# Patient Record
Sex: Female | Born: 1940 | Race: White | Hispanic: No | Marital: Single | Smoking: Former smoker
Health system: Southern US, Community
[De-identification: ages and names within clinical notes are randomized; demographics above are authoritative.]

## PROBLEM LIST (undated history)

## (undated) DIAGNOSIS — I3139 Other pericardial effusion (noninflammatory): Secondary | ICD-10-CM

## (undated) DIAGNOSIS — I10 Essential (primary) hypertension: Secondary | ICD-10-CM

## (undated) DIAGNOSIS — Z9889 Other specified postprocedural states: Secondary | ICD-10-CM

## (undated) DIAGNOSIS — IMO0001 Reserved for inherently not codable concepts without codable children: Secondary | ICD-10-CM

## (undated) DIAGNOSIS — T8859XA Other complications of anesthesia, initial encounter: Secondary | ICD-10-CM

## (undated) DIAGNOSIS — I2510 Atherosclerotic heart disease of native coronary artery without angina pectoris: Secondary | ICD-10-CM

## (undated) DIAGNOSIS — G8929 Other chronic pain: Secondary | ICD-10-CM

## (undated) DIAGNOSIS — T4145XA Adverse effect of unspecified anesthetic, initial encounter: Secondary | ICD-10-CM

## (undated) DIAGNOSIS — R112 Nausea with vomiting, unspecified: Secondary | ICD-10-CM

## (undated) DIAGNOSIS — I313 Pericardial effusion (noninflammatory): Secondary | ICD-10-CM

## (undated) DIAGNOSIS — N183 Chronic kidney disease, stage 3 unspecified: Secondary | ICD-10-CM

## (undated) DIAGNOSIS — C50919 Malignant neoplasm of unspecified site of unspecified female breast: Secondary | ICD-10-CM

## (undated) DIAGNOSIS — I4819 Other persistent atrial fibrillation: Secondary | ICD-10-CM

## (undated) DIAGNOSIS — IMO0002 Reserved for concepts with insufficient information to code with codable children: Secondary | ICD-10-CM

## (undated) DIAGNOSIS — I2584 Coronary atherosclerosis due to calcified coronary lesion: Secondary | ICD-10-CM

## (undated) DIAGNOSIS — J90 Pleural effusion, not elsewhere classified: Secondary | ICD-10-CM

## (undated) DIAGNOSIS — M549 Dorsalgia, unspecified: Secondary | ICD-10-CM

## (undated) HISTORY — DX: Malignant neoplasm of unspecified site of unspecified female breast: C50.919

## (undated) HISTORY — DX: Reserved for inherently not codable concepts without codable children: IMO0001

## (undated) HISTORY — DX: Pleural effusion, not elsewhere classified: J90

## (undated) HISTORY — DX: Reserved for concepts with insufficient information to code with codable children: IMO0002

---

## 1981-11-06 HISTORY — PX: PELVIC LAPAROSCOPY: SHX162

## 1981-12-13 HISTORY — PX: DILATION AND CURETTAGE, DIAGNOSTIC / THERAPEUTIC: SUR384

## 1982-02-21 HISTORY — PX: ABDOMINAL HYSTERECTOMY: SHX81

## 1997-07-13 DIAGNOSIS — C50919 Malignant neoplasm of unspecified site of unspecified female breast: Secondary | ICD-10-CM

## 1997-07-13 HISTORY — DX: Malignant neoplasm of unspecified site of unspecified female breast: C50.919

## 1997-07-13 HISTORY — PX: BREAST BIOPSY: SHX20

## 1997-07-29 HISTORY — PX: MASTECTOMY: SHX3

## 1997-08-11 ENCOUNTER — Encounter: Payer: Self-pay | Admitting: Internal Medicine

## 1998-08-29 ENCOUNTER — Other Ambulatory Visit: Admission: RE | Admit: 1998-08-29 | Discharge: 1998-08-29 | Payer: Self-pay | Admitting: Obstetrics and Gynecology

## 1999-07-10 ENCOUNTER — Ambulatory Visit (HOSPITAL_COMMUNITY): Admission: RE | Admit: 1999-07-10 | Discharge: 1999-07-10 | Payer: Self-pay | Admitting: Oncology

## 1999-07-10 ENCOUNTER — Encounter: Payer: Self-pay | Admitting: Oncology

## 1999-10-12 ENCOUNTER — Other Ambulatory Visit: Admission: RE | Admit: 1999-10-12 | Discharge: 1999-10-12 | Payer: Self-pay | Admitting: Obstetrics and Gynecology

## 1999-12-31 ENCOUNTER — Encounter: Admission: RE | Admit: 1999-12-31 | Discharge: 1999-12-31 | Payer: Self-pay | Admitting: Oncology

## 1999-12-31 ENCOUNTER — Encounter: Payer: Self-pay | Admitting: Oncology

## 2000-09-05 ENCOUNTER — Encounter: Admission: RE | Admit: 2000-09-05 | Discharge: 2000-09-05 | Payer: Self-pay | Admitting: Oncology

## 2000-09-05 ENCOUNTER — Encounter: Payer: Self-pay | Admitting: Oncology

## 2001-09-25 ENCOUNTER — Encounter: Admission: RE | Admit: 2001-09-25 | Discharge: 2001-09-25 | Payer: Self-pay | Admitting: Oncology

## 2001-09-25 ENCOUNTER — Encounter: Payer: Self-pay | Admitting: Oncology

## 2002-09-27 ENCOUNTER — Encounter: Payer: Self-pay | Admitting: Oncology

## 2002-09-27 ENCOUNTER — Encounter: Admission: RE | Admit: 2002-09-27 | Discharge: 2002-09-27 | Payer: Self-pay | Admitting: Oncology

## 2003-06-12 ENCOUNTER — Emergency Department (HOSPITAL_COMMUNITY): Admission: AD | Admit: 2003-06-12 | Discharge: 2003-06-12 | Payer: Self-pay | Admitting: Family Medicine

## 2003-07-18 ENCOUNTER — Encounter: Payer: Self-pay | Admitting: Internal Medicine

## 2003-09-28 ENCOUNTER — Encounter: Admission: RE | Admit: 2003-09-28 | Discharge: 2003-09-28 | Payer: Self-pay | Admitting: Oncology

## 2004-06-20 ENCOUNTER — Ambulatory Visit: Payer: Self-pay | Admitting: Internal Medicine

## 2004-06-27 ENCOUNTER — Ambulatory Visit: Payer: Self-pay | Admitting: Internal Medicine

## 2004-07-11 ENCOUNTER — Ambulatory Visit: Payer: Self-pay | Admitting: Internal Medicine

## 2004-08-08 ENCOUNTER — Ambulatory Visit: Payer: Self-pay | Admitting: Internal Medicine

## 2004-08-15 ENCOUNTER — Ambulatory Visit: Payer: Self-pay | Admitting: Internal Medicine

## 2004-08-28 ENCOUNTER — Ambulatory Visit: Payer: Self-pay | Admitting: Internal Medicine

## 2004-09-20 ENCOUNTER — Ambulatory Visit: Payer: Self-pay | Admitting: Internal Medicine

## 2004-10-01 ENCOUNTER — Encounter: Admission: RE | Admit: 2004-10-01 | Discharge: 2004-10-01 | Payer: Self-pay | Admitting: Oncology

## 2004-11-15 ENCOUNTER — Ambulatory Visit: Payer: Self-pay | Admitting: Internal Medicine

## 2005-03-14 ENCOUNTER — Ambulatory Visit: Payer: Self-pay | Admitting: Internal Medicine

## 2005-03-22 ENCOUNTER — Ambulatory Visit: Payer: Self-pay | Admitting: Internal Medicine

## 2005-04-08 ENCOUNTER — Ambulatory Visit: Payer: Self-pay | Admitting: Internal Medicine

## 2005-04-22 ENCOUNTER — Ambulatory Visit: Payer: Self-pay | Admitting: Gastroenterology

## 2005-04-22 ENCOUNTER — Encounter: Payer: Self-pay | Admitting: Internal Medicine

## 2005-04-22 ENCOUNTER — Ambulatory Visit: Payer: Self-pay | Admitting: Family Medicine

## 2005-05-14 ENCOUNTER — Ambulatory Visit: Payer: Self-pay | Admitting: Oncology

## 2005-05-20 ENCOUNTER — Encounter: Payer: Self-pay | Admitting: Internal Medicine

## 2005-05-20 ENCOUNTER — Ambulatory Visit: Payer: Self-pay | Admitting: Gastroenterology

## 2005-10-07 ENCOUNTER — Ambulatory Visit: Payer: Self-pay | Admitting: Internal Medicine

## 2006-03-27 ENCOUNTER — Ambulatory Visit: Payer: Self-pay | Admitting: Internal Medicine

## 2006-04-09 ENCOUNTER — Ambulatory Visit: Payer: Self-pay | Admitting: Internal Medicine

## 2006-05-12 ENCOUNTER — Ambulatory Visit: Payer: Self-pay | Admitting: Oncology

## 2006-10-20 ENCOUNTER — Ambulatory Visit: Payer: Self-pay | Admitting: Internal Medicine

## 2006-12-05 ENCOUNTER — Ambulatory Visit: Payer: Self-pay | Admitting: Internal Medicine

## 2006-12-05 LAB — CONVERTED CEMR LAB
Cholesterol: 185 mg/dL (ref 0–200)
HDL: 57.9 mg/dL (ref >39.0)
LDL Cholesterol: 94 mg/dL (ref 0–99)
Total CHOL/HDL Ratio: 3.2
Triglycerides: 168 mg/dL — ABNORMAL HIGH (ref 0–149)
VLDL: 34 mg/dL (ref 0–40)

## 2007-01-05 ENCOUNTER — Encounter: Admission: RE | Admit: 2007-01-05 | Discharge: 2007-01-05 | Payer: Self-pay | Admitting: Surgery

## 2007-06-04 ENCOUNTER — Ambulatory Visit: Payer: Self-pay | Admitting: Oncology

## 2007-06-09 LAB — COMPREHENSIVE METABOLIC PANEL
AST: 25 U/L (ref 0–37)
Albumin: 4.3 g/dL (ref 3.5–5.2)
BUN: 23 mg/dL (ref 6–23)
CO2: 26 mEq/L (ref 19–32)
Calcium: 9.6 mg/dL (ref 8.4–10.5)
Chloride: 103 mEq/L (ref 96–112)
Glucose, Bld: 91 mg/dL (ref 70–99)
Potassium: 3.9 mEq/L (ref 3.5–5.3)

## 2007-06-09 LAB — CBC WITH DIFFERENTIAL/PLATELET
Basophils Absolute: 0 10*3/uL (ref 0.0–0.1)
EOS%: 2.8 % (ref 0.0–7.0)
Eosinophils Absolute: 0.1 10*3/uL (ref 0.0–0.5)
HCT: 40.8 % (ref 34.8–46.6)
HGB: 14.5 g/dL (ref 11.6–15.9)
MONO#: 0.5 10*3/uL (ref 0.1–0.9)
NEUT#: 2.6 10*3/uL (ref 1.5–6.5)
RDW: 13.2 % (ref 11.3–14.5)
WBC: 5.2 10*3/uL (ref 3.9–10.0)
lymph#: 2 10*3/uL (ref 0.9–3.3)

## 2007-06-15 ENCOUNTER — Ambulatory Visit: Payer: Self-pay | Admitting: Internal Medicine

## 2007-06-15 LAB — CONVERTED CEMR LAB
ALT: 37 units/L — ABNORMAL HIGH (ref 0–35)
AST: 33 units/L (ref 0–37)
Albumin: 4.2 g/dL (ref 3.5–5.2)
Alkaline Phosphatase: 52 units/L (ref 39–117)
BUN: 18 mg/dL (ref 6–23)
Bilirubin, Direct: 0.2 mg/dL (ref 0.0–0.3)
CO2: 28 meq/L (ref 19–32)
Calcium: 10.1 mg/dL (ref 8.4–10.5)
Chloride: 102 meq/L (ref 96–112)
Cholesterol: 161 mg/dL (ref 0–200)
Creatinine, Ser: 0.9 mg/dL (ref 0.4–1.2)
GFR calc Af Amer: 81 mL/min
GFR calc non Af Amer: 67 mL/min
Glucose, Bld: 96 mg/dL (ref 70–99)
HDL: 43.7 mg/dL (ref >39.0)
LDL Cholesterol: 74 mg/dL (ref 0–99)
Potassium: 4.3 meq/L (ref 3.5–5.1)
Sodium: 141 meq/L (ref 135–145)
TSH: 1.82 microintl units/mL (ref 0.35–5.50)
Total Bilirubin: 1.2 mg/dL (ref 0.3–1.2)
Total CHOL/HDL Ratio: 3.7
Total Protein: 7.1 g/dL (ref 6.0–8.3)
Triglycerides: 218 mg/dL (ref 0–149)
VLDL: 44 mg/dL — ABNORMAL HIGH (ref 0–40)

## 2007-06-16 ENCOUNTER — Encounter: Payer: Self-pay | Admitting: Internal Medicine

## 2007-07-09 ENCOUNTER — Ambulatory Visit: Payer: Self-pay | Admitting: Internal Medicine

## 2007-07-24 ENCOUNTER — Telehealth: Payer: Self-pay | Admitting: Internal Medicine

## 2007-07-24 DIAGNOSIS — I10 Essential (primary) hypertension: Secondary | ICD-10-CM

## 2007-07-24 DIAGNOSIS — Z853 Personal history of malignant neoplasm of breast: Secondary | ICD-10-CM

## 2007-07-24 DIAGNOSIS — J309 Allergic rhinitis, unspecified: Secondary | ICD-10-CM

## 2007-07-24 DIAGNOSIS — E785 Hyperlipidemia, unspecified: Secondary | ICD-10-CM

## 2007-08-28 ENCOUNTER — Ambulatory Visit: Payer: Self-pay | Admitting: Internal Medicine

## 2007-09-03 ENCOUNTER — Ambulatory Visit: Payer: Self-pay | Admitting: Internal Medicine

## 2007-09-09 ENCOUNTER — Ambulatory Visit: Payer: Self-pay | Admitting: Internal Medicine

## 2007-09-10 ENCOUNTER — Encounter: Payer: Self-pay | Admitting: Internal Medicine

## 2007-10-21 ENCOUNTER — Encounter: Admission: RE | Admit: 2007-10-21 | Discharge: 2007-10-21 | Payer: Self-pay | Admitting: Oncology

## 2008-01-04 ENCOUNTER — Ambulatory Visit: Payer: Self-pay | Admitting: Internal Medicine

## 2008-01-04 DIAGNOSIS — T887XXA Unspecified adverse effect of drug or medicament, initial encounter: Secondary | ICD-10-CM

## 2008-01-04 LAB — CONVERTED CEMR LAB
ALT: 28 units/L (ref 0–35)
AST: 30 units/L (ref 0–37)
Albumin: 4.4 g/dL (ref 3.5–5.2)
BUN: 21 mg/dL (ref 6–23)
CO2: 27 meq/L (ref 19–32)
Chloride: 106 meq/L (ref 96–112)
Cholesterol: 170 mg/dL (ref 0–200)
Creatinine, Ser: 0.9 mg/dL (ref 0.4–1.2)
Direct LDL: 90 mg/dL
GFR calc non Af Amer: 67 mL/min
VLDL: 42 mg/dL — ABNORMAL HIGH (ref 0–40)

## 2008-01-15 ENCOUNTER — Telehealth: Payer: Self-pay | Admitting: Internal Medicine

## 2008-02-03 ENCOUNTER — Ambulatory Visit: Payer: Self-pay | Admitting: Internal Medicine

## 2008-02-03 DIAGNOSIS — M949 Disorder of cartilage, unspecified: Secondary | ICD-10-CM

## 2008-02-03 DIAGNOSIS — M899 Disorder of bone, unspecified: Secondary | ICD-10-CM | POA: Insufficient documentation

## 2008-03-08 ENCOUNTER — Encounter: Payer: Self-pay | Admitting: Internal Medicine

## 2008-03-08 ENCOUNTER — Telehealth: Payer: Self-pay | Admitting: Internal Medicine

## 2008-03-08 ENCOUNTER — Ambulatory Visit: Payer: Self-pay | Admitting: Internal Medicine

## 2008-03-08 LAB — CONVERTED CEMR LAB
Nitrite: NEGATIVE
Specific Gravity, Urine: 1.015

## 2008-06-06 ENCOUNTER — Ambulatory Visit: Payer: Self-pay | Admitting: Oncology

## 2008-06-08 ENCOUNTER — Encounter: Payer: Self-pay | Admitting: Internal Medicine

## 2008-06-08 LAB — CBC WITH DIFFERENTIAL/PLATELET
Basophils Absolute: 0 10*3/uL (ref 0.0–0.1)
Eosinophils Absolute: 0.3 10*3/uL (ref 0.0–0.5)
HGB: 14.7 g/dL (ref 11.6–15.9)
MONO#: 0.5 10*3/uL (ref 0.1–0.9)
NEUT#: 2.5 10*3/uL (ref 1.5–6.5)
RDW: 12.9 % (ref 11.3–14.5)
WBC: 5.2 10*3/uL (ref 3.9–10.0)
lymph#: 1.9 10*3/uL (ref 0.9–3.3)

## 2008-06-08 LAB — COMPREHENSIVE METABOLIC PANEL
Albumin: 4.3 g/dL (ref 3.5–5.2)
BUN: 19 mg/dL (ref 6–23)
Calcium: 10.5 mg/dL (ref 8.4–10.5)
Chloride: 105 mEq/L (ref 96–112)
Glucose, Bld: 82 mg/dL (ref 70–99)
Potassium: 3.8 mEq/L (ref 3.5–5.3)

## 2008-06-15 ENCOUNTER — Encounter: Payer: Self-pay | Admitting: Internal Medicine

## 2008-06-27 ENCOUNTER — Ambulatory Visit: Payer: Self-pay | Admitting: Internal Medicine

## 2008-06-27 DIAGNOSIS — J9801 Acute bronchospasm: Secondary | ICD-10-CM | POA: Insufficient documentation

## 2008-06-29 ENCOUNTER — Ambulatory Visit: Payer: Self-pay | Admitting: Internal Medicine

## 2008-06-29 LAB — CONVERTED CEMR LAB
ALT: 30 units/L (ref 0–35)
Alkaline Phosphatase: 52 units/L (ref 39–117)
Bilirubin, Direct: 0.1 mg/dL (ref 0.0–0.3)
CO2: 28 meq/L (ref 19–32)
GFR calc Af Amer: 72 mL/min
Glucose, Bld: 97 mg/dL (ref 70–99)
Potassium: 4 meq/L (ref 3.5–5.1)
Sodium: 142 meq/L (ref 135–145)
Total Protein: 7.2 g/dL (ref 6.0–8.3)

## 2008-07-29 ENCOUNTER — Ambulatory Visit: Payer: Self-pay | Admitting: Internal Medicine

## 2008-08-15 ENCOUNTER — Ambulatory Visit: Payer: Self-pay | Admitting: Internal Medicine

## 2008-09-12 ENCOUNTER — Ambulatory Visit: Payer: Self-pay | Admitting: Internal Medicine

## 2008-10-10 ENCOUNTER — Ambulatory Visit: Payer: Self-pay | Admitting: Internal Medicine

## 2008-10-12 ENCOUNTER — Encounter: Payer: Self-pay | Admitting: Internal Medicine

## 2008-10-12 ENCOUNTER — Encounter: Admission: RE | Admit: 2008-10-12 | Discharge: 2008-10-12 | Payer: Self-pay | Admitting: Allergy

## 2008-10-13 ENCOUNTER — Ambulatory Visit: Payer: Self-pay | Admitting: Internal Medicine

## 2008-10-13 DIAGNOSIS — J90 Pleural effusion, not elsewhere classified: Secondary | ICD-10-CM | POA: Insufficient documentation

## 2008-10-14 LAB — CONVERTED CEMR LAB: BUN: 28 mg/dL — ABNORMAL HIGH (ref 6–23)

## 2008-10-17 ENCOUNTER — Ambulatory Visit: Payer: Self-pay | Admitting: Cardiology

## 2008-10-17 ENCOUNTER — Ambulatory Visit: Payer: Self-pay | Admitting: Pulmonary Disease

## 2008-10-17 ENCOUNTER — Inpatient Hospital Stay (HOSPITAL_COMMUNITY): Admission: EM | Admit: 2008-10-17 | Discharge: 2008-10-18 | Payer: Self-pay | Admitting: Emergency Medicine

## 2008-10-18 ENCOUNTER — Encounter: Payer: Self-pay | Admitting: Pulmonary Disease

## 2008-10-18 HISTORY — PX: THORACENTESIS: SHX235

## 2008-10-25 ENCOUNTER — Ambulatory Visit: Payer: Self-pay | Admitting: Pulmonary Disease

## 2008-10-31 ENCOUNTER — Ambulatory Visit (HOSPITAL_COMMUNITY): Admission: RE | Admit: 2008-10-31 | Discharge: 2008-10-31 | Payer: Self-pay | Admitting: Oncology

## 2008-10-31 ENCOUNTER — Ambulatory Visit: Payer: Self-pay | Admitting: Oncology

## 2008-11-02 ENCOUNTER — Encounter: Payer: Self-pay | Admitting: Internal Medicine

## 2008-11-02 ENCOUNTER — Ambulatory Visit: Payer: Self-pay | Admitting: Thoracic Surgery

## 2008-11-02 ENCOUNTER — Emergency Department (HOSPITAL_COMMUNITY): Admission: EM | Admit: 2008-11-02 | Discharge: 2008-11-02 | Payer: Self-pay | Admitting: Emergency Medicine

## 2008-11-02 LAB — COMPREHENSIVE METABOLIC PANEL
ALT: 19 U/L (ref 0–35)
AST: 21 U/L (ref 0–37)
Albumin: 4.1 g/dL (ref 3.5–5.2)
CO2: 23 mEq/L (ref 19–32)
Calcium: 13.4 mg/dL (ref 8.4–10.5)
Chloride: 104 mEq/L (ref 96–112)
Potassium: 3.6 mEq/L (ref 3.5–5.3)

## 2008-11-02 LAB — CBC WITH DIFFERENTIAL/PLATELET
BASO%: 0.8 % (ref 0.0–2.0)
Basophils Absolute: 0.1 10*3/uL (ref 0.0–0.1)
EOS%: 3 % (ref 0.0–7.0)
HCT: 38.6 % (ref 34.8–46.6)
HGB: 13.4 g/dL (ref 11.6–15.9)
MCH: 29.4 pg (ref 25.1–34.0)
MCHC: 34.7 g/dL (ref 31.5–36.0)
MONO#: 0.7 10*3/uL (ref 0.1–0.9)
NEUT%: 60.9 % (ref 38.4–76.8)
RDW: 12.7 % (ref 11.2–14.5)
WBC: 7.2 10*3/uL (ref 3.9–10.3)
lymph#: 1.8 10*3/uL (ref 0.9–3.3)

## 2008-11-02 LAB — CEA: CEA: 36 ng/mL — ABNORMAL HIGH (ref 0.0–5.0)

## 2008-11-04 ENCOUNTER — Ambulatory Visit (HOSPITAL_COMMUNITY): Admission: RE | Admit: 2008-11-04 | Discharge: 2008-11-04 | Payer: Self-pay | Admitting: Pediatrics

## 2008-11-04 ENCOUNTER — Encounter: Payer: Self-pay | Admitting: Pulmonary Disease

## 2008-11-08 ENCOUNTER — Encounter: Payer: Self-pay | Admitting: Thoracic Surgery

## 2008-11-08 ENCOUNTER — Inpatient Hospital Stay (HOSPITAL_COMMUNITY): Admission: RE | Admit: 2008-11-08 | Discharge: 2008-11-13 | Payer: Self-pay | Admitting: Thoracic Surgery

## 2008-11-08 ENCOUNTER — Ambulatory Visit: Payer: Self-pay | Admitting: Thoracic Surgery

## 2008-11-08 DIAGNOSIS — J90 Pleural effusion, not elsewhere classified: Secondary | ICD-10-CM

## 2008-11-08 HISTORY — DX: Pleural effusion, not elsewhere classified: J90

## 2008-11-16 ENCOUNTER — Encounter: Admission: RE | Admit: 2008-11-16 | Discharge: 2008-11-16 | Payer: Self-pay | Admitting: Thoracic Surgery

## 2008-11-16 ENCOUNTER — Ambulatory Visit: Payer: Self-pay | Admitting: Thoracic Surgery

## 2008-11-18 ENCOUNTER — Encounter: Payer: Self-pay | Admitting: Internal Medicine

## 2008-11-18 LAB — COMPREHENSIVE METABOLIC PANEL
Albumin: 3.5 g/dL (ref 3.5–5.2)
Alkaline Phosphatase: 187 U/L — ABNORMAL HIGH (ref 39–117)
BUN: 25 mg/dL — ABNORMAL HIGH (ref 6–23)
Calcium: 11.8 mg/dL — ABNORMAL HIGH (ref 8.4–10.5)
Chloride: 105 mEq/L (ref 96–112)
Glucose, Bld: 123 mg/dL — ABNORMAL HIGH (ref 70–99)
Potassium: 4.7 mEq/L (ref 3.5–5.3)

## 2008-11-18 LAB — CBC WITH DIFFERENTIAL/PLATELET
Basophils Absolute: 0 10*3/uL (ref 0.0–0.1)
Eosinophils Absolute: 0.1 10*3/uL (ref 0.0–0.5)
HGB: 11.4 g/dL — ABNORMAL LOW (ref 11.6–15.9)
MCV: 84.4 fL (ref 79.5–101.0)
MONO%: 11.9 % (ref 0.0–14.0)
NEUT#: 4.6 10*3/uL (ref 1.5–6.5)
RDW: 13.2 % (ref 11.2–14.5)

## 2008-11-23 ENCOUNTER — Encounter: Admission: RE | Admit: 2008-11-23 | Discharge: 2008-11-23 | Payer: Self-pay | Admitting: Thoracic Surgery

## 2008-11-23 ENCOUNTER — Ambulatory Visit: Payer: Self-pay | Admitting: Thoracic Surgery

## 2008-12-06 ENCOUNTER — Encounter: Admission: RE | Admit: 2008-12-06 | Discharge: 2008-12-06 | Payer: Self-pay | Admitting: Thoracic Surgery

## 2008-12-06 ENCOUNTER — Ambulatory Visit: Payer: Self-pay | Admitting: Thoracic Surgery

## 2008-12-07 ENCOUNTER — Encounter: Payer: Self-pay | Admitting: Pulmonary Disease

## 2008-12-07 LAB — CBC WITH DIFFERENTIAL/PLATELET
BASO%: 0.4 % (ref 0.0–2.0)
Eosinophils Absolute: 0.1 10*3/uL (ref 0.0–0.5)
HCT: 35.1 % (ref 34.8–46.6)
HGB: 12.1 g/dL (ref 11.6–15.9)
MCHC: 34.4 g/dL (ref 31.5–36.0)
MONO#: 0.6 10*3/uL (ref 0.1–0.9)
NEUT#: 2.6 10*3/uL (ref 1.5–6.5)
NEUT%: 54.4 % (ref 38.4–76.8)
WBC: 4.9 10*3/uL (ref 3.9–10.3)
lymph#: 1.5 10*3/uL (ref 0.9–3.3)

## 2008-12-14 LAB — COMPREHENSIVE METABOLIC PANEL
AST: 16 U/L (ref 0–37)
Albumin: 4 g/dL (ref 3.5–5.2)
Alkaline Phosphatase: 93 U/L (ref 39–117)
BUN: 24 mg/dL — ABNORMAL HIGH (ref 6–23)
Creatinine, Ser: 1.01 mg/dL (ref 0.40–1.20)
Glucose, Bld: 103 mg/dL — ABNORMAL HIGH (ref 70–99)

## 2008-12-14 LAB — PTH, INTACT AND CALCIUM
Calcium, Total (PTH): 10 mg/dL (ref 8.4–10.5)
PTH: 11.1 pg/mL — ABNORMAL LOW (ref 14.0–72.0)

## 2009-01-05 ENCOUNTER — Ambulatory Visit: Payer: Self-pay | Admitting: Oncology

## 2009-01-09 LAB — CBC WITH DIFFERENTIAL/PLATELET
EOS%: 3.4 % (ref 0.0–7.0)
MCH: 29.7 pg (ref 25.1–34.0)
MCV: 84.1 fL (ref 79.5–101.0)
MONO%: 11 % (ref 0.0–14.0)
NEUT#: 1.4 10*3/uL — ABNORMAL LOW (ref 1.5–6.5)
RBC: 4.65 10*6/uL (ref 3.70–5.45)
RDW: 14.8 % — ABNORMAL HIGH (ref 11.2–14.5)
lymph#: 1.5 10*3/uL (ref 0.9–3.3)

## 2009-01-09 LAB — COMPREHENSIVE METABOLIC PANEL
ALT: 16 U/L (ref 0–35)
AST: 20 U/L (ref 0–37)
Albumin: 4.3 g/dL (ref 3.5–5.2)
Alkaline Phosphatase: 79 U/L (ref 39–117)
Chloride: 107 mEq/L (ref 96–112)
Potassium: 4.2 mEq/L (ref 3.5–5.3)
Sodium: 140 mEq/L (ref 135–145)
Total Protein: 7.3 g/dL (ref 6.0–8.3)

## 2009-01-09 LAB — LIPID PANEL: LDL Cholesterol: 72 mg/dL (ref 0–99)

## 2009-02-14 ENCOUNTER — Ambulatory Visit: Payer: Self-pay | Admitting: Oncology

## 2009-02-17 LAB — CBC WITH DIFFERENTIAL/PLATELET
Eosinophils Absolute: 0.1 10*3/uL (ref 0.0–0.5)
MONO#: 0.4 10*3/uL (ref 0.1–0.9)
NEUT#: 1.7 10*3/uL (ref 1.5–6.5)
RBC: 4.74 10*6/uL (ref 3.70–5.45)
RDW: 14.1 % (ref 11.2–14.5)
WBC: 4 10*3/uL (ref 3.9–10.3)
lymph#: 1.7 10*3/uL (ref 0.9–3.3)

## 2009-02-17 LAB — COMPREHENSIVE METABOLIC PANEL
Albumin: 4.3 g/dL (ref 3.5–5.2)
Alkaline Phosphatase: 83 U/L (ref 39–117)
CO2: 26 mEq/L (ref 19–32)
Glucose, Bld: 115 mg/dL — ABNORMAL HIGH (ref 70–99)
Potassium: 4 mEq/L (ref 3.5–5.3)
Sodium: 141 mEq/L (ref 135–145)
Total Protein: 7.1 g/dL (ref 6.0–8.3)

## 2009-02-17 LAB — CANCER ANTIGEN 27.29: CA 27.29: 401 U/mL — ABNORMAL HIGH (ref 0–39)

## 2009-02-17 LAB — CEA: CEA: 13.4 ng/mL — ABNORMAL HIGH (ref 0.0–5.0)

## 2009-03-08 ENCOUNTER — Encounter: Admission: RE | Admit: 2009-03-08 | Discharge: 2009-03-08 | Payer: Self-pay | Admitting: Oncology

## 2009-03-28 ENCOUNTER — Ambulatory Visit: Payer: Self-pay | Admitting: Thoracic Surgery

## 2009-04-11 ENCOUNTER — Ambulatory Visit: Payer: Self-pay | Admitting: Oncology

## 2009-04-12 ENCOUNTER — Ambulatory Visit: Payer: Self-pay | Admitting: Oncology

## 2009-04-13 LAB — CBC WITH DIFFERENTIAL/PLATELET
Basophils Absolute: 0 10*3/uL (ref 0.0–0.1)
HCT: 39.1 % (ref 34.8–46.6)
HGB: 13.7 g/dL (ref 11.6–15.9)
LYMPH%: 34.6 % (ref 14.0–49.7)
MCH: 29.8 pg (ref 25.1–34.0)
MONO#: 0.5 10*3/uL (ref 0.1–0.9)
NEUT%: 51.9 % (ref 38.4–76.8)
Platelets: 218 10*3/uL (ref 145–400)
WBC: 4.6 10*3/uL (ref 3.9–10.3)
lymph#: 1.6 10*3/uL (ref 0.9–3.3)

## 2009-04-13 LAB — COMPREHENSIVE METABOLIC PANEL
AST: 20 U/L (ref 0–37)
BUN: 24 mg/dL — ABNORMAL HIGH (ref 6–23)
CO2: 24 mEq/L (ref 19–32)
Calcium: 9.8 mg/dL (ref 8.4–10.5)
Chloride: 106 mEq/L (ref 96–112)
Creatinine, Ser: 0.99 mg/dL (ref 0.40–1.20)

## 2009-04-13 LAB — CANCER ANTIGEN 27.29: CA 27.29: 364 U/mL — ABNORMAL HIGH (ref 0–39)

## 2009-06-08 ENCOUNTER — Ambulatory Visit: Payer: Self-pay | Admitting: Oncology

## 2009-06-12 LAB — CBC WITH DIFFERENTIAL/PLATELET
BASO%: 0.6 % (ref 0.0–2.0)
Basophils Absolute: 0 10*3/uL (ref 0.0–0.1)
EOS%: 4 % (ref 0.0–7.0)
MCH: 30.9 pg (ref 25.1–34.0)
MCHC: 34.5 g/dL (ref 31.5–36.0)
MCV: 89.3 fL (ref 79.5–101.0)
MONO%: 10 % (ref 0.0–14.0)
RBC: 4.67 10*6/uL (ref 3.70–5.45)
RDW: 13.2 % (ref 11.2–14.5)
lymph#: 1.8 10*3/uL (ref 0.9–3.3)

## 2009-06-13 LAB — COMPREHENSIVE METABOLIC PANEL
ALT: 23 U/L (ref 0–35)
AST: 25 U/L (ref 0–37)
Albumin: 4.6 g/dL (ref 3.5–5.2)
Alkaline Phosphatase: 72 U/L (ref 39–117)
BUN: 23 mg/dL (ref 6–23)
Chloride: 103 mEq/L (ref 96–112)
Potassium: 4.3 mEq/L (ref 3.5–5.3)
Sodium: 139 mEq/L (ref 135–145)

## 2009-06-13 LAB — CEA: CEA: 10.3 ng/mL — ABNORMAL HIGH (ref 0.0–5.0)

## 2009-07-19 ENCOUNTER — Ambulatory Visit: Payer: Self-pay | Admitting: Oncology

## 2009-07-24 LAB — CBC WITH DIFFERENTIAL/PLATELET
Basophils Absolute: 0 10*3/uL (ref 0.0–0.1)
EOS%: 6.5 % (ref 0.0–7.0)
Eosinophils Absolute: 0.3 10*3/uL (ref 0.0–0.5)
HCT: 41.5 % (ref 34.8–46.6)
HGB: 14.6 g/dL (ref 11.6–15.9)
MCH: 31 pg (ref 25.1–34.0)
NEUT%: 40.1 % (ref 38.4–76.8)
lymph#: 1.7 10*3/uL (ref 0.9–3.3)

## 2009-07-24 LAB — COMPREHENSIVE METABOLIC PANEL
AST: 19 U/L (ref 0–37)
BUN: 23 mg/dL (ref 6–23)
CO2: 23 mEq/L (ref 19–32)
Calcium: 9.4 mg/dL (ref 8.4–10.5)
Chloride: 106 mEq/L (ref 96–112)
Creatinine, Ser: 0.86 mg/dL (ref 0.40–1.20)
Glucose, Bld: 93 mg/dL (ref 70–99)

## 2009-07-24 LAB — LIPID PANEL
Cholesterol: 170 mg/dL (ref 0–200)
Triglycerides: 228 mg/dL — ABNORMAL HIGH (ref <150)

## 2009-07-24 LAB — CANCER ANTIGEN 27.29: CA 27.29: 385 U/mL — ABNORMAL HIGH (ref 0–39)

## 2009-07-24 LAB — CEA: CEA: 11 ng/mL — ABNORMAL HIGH (ref 0.0–5.0)

## 2009-09-15 ENCOUNTER — Ambulatory Visit: Payer: Self-pay | Admitting: Oncology

## 2009-09-15 LAB — COMPREHENSIVE METABOLIC PANEL
ALT: 21 U/L (ref 0–35)
AST: 23 U/L (ref 0–37)
Chloride: 105 mEq/L (ref 96–112)
Creatinine, Ser: 0.84 mg/dL (ref 0.40–1.20)
Sodium: 142 mEq/L (ref 135–145)
Total Bilirubin: 0.6 mg/dL (ref 0.3–1.2)
Total Protein: 7.3 g/dL (ref 6.0–8.3)

## 2009-09-15 LAB — CBC WITH DIFFERENTIAL/PLATELET
BASO%: 0.5 % (ref 0.0–2.0)
EOS%: 4.5 % (ref 0.0–7.0)
HCT: 40.9 % (ref 34.8–46.6)
MCH: 30.6 pg (ref 25.1–34.0)
MCHC: 34.8 g/dL (ref 31.5–36.0)
MONO#: 0.5 10*3/uL (ref 0.1–0.9)
NEUT%: 49.2 % (ref 38.4–76.8)
RBC: 4.65 10*6/uL (ref 3.70–5.45)
WBC: 5.4 10*3/uL (ref 3.9–10.3)
lymph#: 2 10*3/uL (ref 0.9–3.3)

## 2009-11-06 ENCOUNTER — Ambulatory Visit: Payer: Self-pay | Admitting: Oncology

## 2009-11-08 LAB — CANCER ANTIGEN 27.29: CA 27.29: 335 U/mL — ABNORMAL HIGH (ref 0–39)

## 2009-11-08 LAB — COMPREHENSIVE METABOLIC PANEL
ALT: 17 U/L (ref 0–35)
Albumin: 4.3 g/dL (ref 3.5–5.2)
CO2: 24 mEq/L (ref 19–32)
Calcium: 9.9 mg/dL (ref 8.4–10.5)
Chloride: 103 mEq/L (ref 96–112)
Glucose, Bld: 107 mg/dL — ABNORMAL HIGH (ref 70–99)
Potassium: 4.3 mEq/L (ref 3.5–5.3)
Sodium: 138 mEq/L (ref 135–145)
Total Bilirubin: 0.7 mg/dL (ref 0.3–1.2)
Total Protein: 6.8 g/dL (ref 6.0–8.3)

## 2010-01-05 ENCOUNTER — Ambulatory Visit: Payer: Self-pay | Admitting: Oncology

## 2010-01-09 LAB — CBC WITH DIFFERENTIAL/PLATELET
Basophils Absolute: 0 10*3/uL (ref 0.0–0.1)
EOS%: 4.7 % (ref 0.0–7.0)
Eosinophils Absolute: 0.3 10*3/uL (ref 0.0–0.5)
HGB: 13.8 g/dL (ref 11.6–15.9)
LYMPH%: 32.5 % (ref 14.0–49.7)
MCH: 30.6 pg (ref 25.1–34.0)
MCV: 87.3 fL (ref 79.5–101.0)
MONO%: 9.8 % (ref 0.0–14.0)
NEUT#: 2.8 10*3/uL (ref 1.5–6.5)
Platelets: 216 10*3/uL (ref 145–400)
RDW: 13.3 % (ref 11.2–14.5)

## 2010-01-09 LAB — COMPREHENSIVE METABOLIC PANEL
AST: 29 U/L (ref 0–37)
Alkaline Phosphatase: 62 U/L (ref 39–117)
BUN: 19 mg/dL (ref 6–23)
Creatinine, Ser: 0.82 mg/dL (ref 0.40–1.20)
Glucose, Bld: 93 mg/dL (ref 70–99)
Potassium: 4.2 mEq/L (ref 3.5–5.3)
Total Bilirubin: 1 mg/dL (ref 0.3–1.2)

## 2010-01-09 LAB — CEA: CEA: 9 ng/mL — ABNORMAL HIGH (ref 0.0–5.0)

## 2010-01-10 ENCOUNTER — Ambulatory Visit (HOSPITAL_COMMUNITY): Admission: RE | Admit: 2010-01-10 | Discharge: 2010-01-10 | Payer: Self-pay | Admitting: Oncology

## 2010-01-11 ENCOUNTER — Ambulatory Visit
Admission: RE | Admit: 2010-01-11 | Discharge: 2010-01-11 | Payer: Self-pay | Source: Home / Self Care | Admitting: Oncology

## 2010-01-11 ENCOUNTER — Ambulatory Visit: Payer: Self-pay | Admitting: Surgery

## 2010-01-11 ENCOUNTER — Encounter: Payer: Self-pay | Admitting: Oncology

## 2010-03-07 ENCOUNTER — Ambulatory Visit: Payer: Self-pay | Admitting: Oncology

## 2010-03-09 ENCOUNTER — Encounter: Admission: RE | Admit: 2010-03-09 | Discharge: 2010-03-09 | Payer: Self-pay | Admitting: Oncology

## 2010-03-09 LAB — COMPREHENSIVE METABOLIC PANEL
ALT: 16 U/L (ref 0–35)
AST: 22 U/L (ref 0–37)
Albumin: 4.5 g/dL (ref 3.5–5.2)
Alkaline Phosphatase: 61 U/L (ref 39–117)
Calcium: 10 mg/dL (ref 8.4–10.5)
Chloride: 106 mEq/L (ref 96–112)
Creatinine, Ser: 0.87 mg/dL (ref 0.40–1.20)
Potassium: 4.4 mEq/L (ref 3.5–5.3)

## 2010-05-03 ENCOUNTER — Ambulatory Visit: Payer: Self-pay | Admitting: Oncology

## 2010-05-07 LAB — COMPREHENSIVE METABOLIC PANEL
Albumin: 3.9 g/dL (ref 3.5–5.2)
BUN: 24 mg/dL — ABNORMAL HIGH (ref 6–23)
CO2: 28 mEq/L (ref 19–32)
Calcium: 9.7 mg/dL (ref 8.4–10.5)
Chloride: 106 mEq/L (ref 96–112)
Creatinine, Ser: 0.98 mg/dL (ref 0.40–1.20)
Glucose, Bld: 132 mg/dL — ABNORMAL HIGH (ref 70–99)
Potassium: 3.8 mEq/L (ref 3.5–5.3)

## 2010-05-07 LAB — CANCER ANTIGEN 27.29: CA 27.29: 267 U/mL — ABNORMAL HIGH (ref 0–39)

## 2010-06-29 ENCOUNTER — Ambulatory Visit: Payer: Self-pay | Admitting: Oncology

## 2010-07-03 LAB — CBC WITH DIFFERENTIAL/PLATELET
Basophils Absolute: 0 10*3/uL (ref 0.0–0.1)
Eosinophils Absolute: 0.3 10*3/uL (ref 0.0–0.5)
HGB: 14.4 g/dL (ref 11.6–15.9)
LYMPH%: 37.7 % (ref 14.0–49.7)
MCH: 30.7 pg (ref 25.1–34.0)
MCV: 86.8 fL (ref 79.5–101.0)
MONO%: 11.6 % (ref 0.0–14.0)
NEUT#: 1.9 10*3/uL (ref 1.5–6.5)
NEUT%: 42.4 % (ref 38.4–76.8)
Platelets: 233 10*3/uL (ref 145–400)

## 2010-07-03 LAB — COMPREHENSIVE METABOLIC PANEL
Albumin: 4.5 g/dL (ref 3.5–5.2)
Alkaline Phosphatase: 69 U/L (ref 39–117)
BUN: 22 mg/dL (ref 6–23)
Creatinine, Ser: 0.9 mg/dL (ref 0.40–1.20)
Glucose, Bld: 91 mg/dL (ref 70–99)
Total Bilirubin: 0.9 mg/dL (ref 0.3–1.2)

## 2010-07-30 ENCOUNTER — Ambulatory Visit: Payer: Self-pay | Admitting: Oncology

## 2010-08-02 LAB — LIPID PANEL
Cholesterol: 174 mg/dL (ref 0–200)
HDL: 50 mg/dL (ref >39)
LDL Cholesterol: 72 mg/dL (ref 0–99)
Total CHOL/HDL Ratio: 3.5 ratio
Triglycerides: 259 mg/dL — ABNORMAL HIGH (ref <150)
VLDL: 52 mg/dL — ABNORMAL HIGH (ref 0–40)

## 2010-08-02 LAB — CANCER ANTIGEN 27.29: CA 27.29: 276 U/mL — ABNORMAL HIGH (ref 0–39)

## 2010-08-12 ENCOUNTER — Encounter: Payer: Self-pay | Admitting: Oncology

## 2010-08-21 NOTE — Letter (Signed)
Summary: [facility]  [facility]   Imported By: Maryln Gottron 07/13/2008 14:29:38  _____________________________________________________________________  External Attachment:    Type:   Image     Comment:   External Document

## 2010-09-21 ENCOUNTER — Encounter (HOSPITAL_BASED_OUTPATIENT_CLINIC_OR_DEPARTMENT_OTHER): Payer: Medicare Other | Admitting: Oncology

## 2010-09-21 ENCOUNTER — Other Ambulatory Visit: Payer: Self-pay | Admitting: Oncology

## 2010-09-21 DIAGNOSIS — M949 Disorder of cartilage, unspecified: Secondary | ICD-10-CM

## 2010-09-21 DIAGNOSIS — C782 Secondary malignant neoplasm of pleura: Secondary | ICD-10-CM

## 2010-09-21 DIAGNOSIS — C50919 Malignant neoplasm of unspecified site of unspecified female breast: Secondary | ICD-10-CM

## 2010-09-21 DIAGNOSIS — Z87891 Personal history of nicotine dependence: Secondary | ICD-10-CM

## 2010-09-21 LAB — CBC WITH DIFFERENTIAL/PLATELET
Eosinophils Absolute: 0.2 10*3/uL (ref 0.0–0.5)
HCT: 39.5 % (ref 34.8–46.6)
HGB: 13.6 g/dL (ref 11.6–15.9)
LYMPH%: 30.5 % (ref 14.0–49.7)
MONO#: 0.5 10*3/uL (ref 0.1–0.9)
NEUT#: 2.3 10*3/uL (ref 1.5–6.5)
Platelets: 219 10*3/uL (ref 145–400)
RBC: 4.57 10*6/uL (ref 3.70–5.45)
WBC: 4.4 10*3/uL (ref 3.9–10.3)

## 2010-09-21 LAB — COMPREHENSIVE METABOLIC PANEL
Albumin: 4.6 g/dL (ref 3.5–5.2)
CO2: 23 mEq/L (ref 19–32)
Glucose, Bld: 96 mg/dL (ref 70–99)
Sodium: 140 mEq/L (ref 135–145)
Total Bilirubin: 0.6 mg/dL (ref 0.3–1.2)
Total Protein: 7.4 g/dL (ref 6.0–8.3)

## 2010-09-21 LAB — CANCER ANTIGEN 27.29: CA 27.29: 292 U/mL — ABNORMAL HIGH (ref 0–39)

## 2010-10-07 LAB — GLUCOSE, CAPILLARY: Glucose-Capillary: 104 mg/dL — ABNORMAL HIGH (ref 70–99)

## 2010-10-31 LAB — CBC
HCT: 39.5 % (ref 36.0–46.0)
Hemoglobin: 10.3 g/dL — ABNORMAL LOW (ref 12.0–15.0)
Hemoglobin: 9.9 g/dL — ABNORMAL LOW (ref 12.0–15.0)
MCHC: 31.9 g/dL (ref 30.0–36.0)
MCHC: 34 g/dL (ref 30.0–36.0)
MCHC: 34.6 g/dL (ref 30.0–36.0)
MCHC: 35.4 g/dL (ref 30.0–36.0)
MCV: 86.2 fL (ref 78.0–100.0)
MCV: 86.5 fL (ref 78.0–100.0)
MCV: 86.6 fL (ref 78.0–100.0)
MCV: 86.8 fL (ref 78.0–100.0)
Platelets: 215 10*3/uL (ref 150–400)
Platelets: 247 10*3/uL (ref 150–400)
Platelets: 291 10*3/uL (ref 150–400)
RBC: 3.28 MIL/uL — ABNORMAL LOW (ref 3.87–5.11)
RBC: 3.71 MIL/uL — ABNORMAL LOW (ref 3.87–5.11)
RBC: 3.74 MIL/uL — ABNORMAL LOW (ref 3.87–5.11)
RBC: 4.15 MIL/uL (ref 3.87–5.11)
RDW: 13.1 % (ref 11.5–15.5)
RDW: 13.1 % (ref 11.5–15.5)
WBC: 11.9 10*3/uL — ABNORMAL HIGH (ref 4.0–10.5)
WBC: 14.8 10*3/uL — ABNORMAL HIGH (ref 4.0–10.5)
WBC: 5.9 10*3/uL (ref 4.0–10.5)
WBC: 6.1 10*3/uL (ref 4.0–10.5)
WBC: 7.7 10*3/uL (ref 4.0–10.5)

## 2010-10-31 LAB — BLOOD GAS, ARTERIAL
Acid-Base Excess: 3 mmol/L — ABNORMAL HIGH (ref 0.0–2.0)
Bicarbonate: 25.1 mEq/L — ABNORMAL HIGH (ref 20.0–24.0)
FIO2: 0.21 %
O2 Saturation: 98 %
Patient temperature: 98.6
Patient temperature: 98.6
TCO2: 26 mmol/L (ref 0–100)
pH, Arterial: 7.442 — ABNORMAL HIGH (ref 7.350–7.400)

## 2010-10-31 LAB — POCT I-STAT 3, ART BLOOD GAS (G3+)
pCO2 arterial: 30.1 mmHg — ABNORMAL LOW (ref 35.0–45.0)
pH, Arterial: 7.529 — ABNORMAL HIGH (ref 7.350–7.400)

## 2010-10-31 LAB — BASIC METABOLIC PANEL
BUN: 36 mg/dL — ABNORMAL HIGH (ref 6–23)
CO2: 19 mEq/L (ref 19–32)
CO2: 24 mEq/L (ref 19–32)
Calcium: 9 mg/dL (ref 8.4–10.5)
Calcium: 13.1 mg/dL (ref 8.4–10.5)
Chloride: 104 mEq/L (ref 96–112)
Creatinine, Ser: 1.12 mg/dL (ref 0.4–1.2)
Creatinine, Ser: 1.32 mg/dL — ABNORMAL HIGH (ref 0.4–1.2)
GFR calc Af Amer: 59 mL/min — ABNORMAL LOW (ref >60)

## 2010-10-31 LAB — DIFFERENTIAL
Basophils Relative: 1 % (ref 0–1)
Eosinophils Absolute: 0.2 10*3/uL (ref 0.0–0.7)
Lymphs Abs: 1.1 10*3/uL (ref 0.7–4.0)
Monocytes Absolute: 0.6 10*3/uL (ref 0.1–1.0)
Monocytes Relative: 10 % (ref 3–12)
Neutrophils Relative %: 69 % (ref 43–77)

## 2010-10-31 LAB — COMPREHENSIVE METABOLIC PANEL
ALT: 30 U/L (ref 0–35)
AST: 32 U/L (ref 0–37)
Albumin: 3.9 g/dL (ref 3.5–5.2)
Alkaline Phosphatase: 58 U/L (ref 39–117)
BUN: 23 mg/dL (ref 6–23)
CO2: 22 mEq/L (ref 19–32)
Chloride: 106 mEq/L (ref 96–112)
Chloride: 106 mEq/L (ref 96–112)
Creatinine, Ser: 1.24 mg/dL — ABNORMAL HIGH (ref 0.4–1.2)
Creatinine, Ser: 1.33 mg/dL — ABNORMAL HIGH (ref 0.4–1.2)
GFR calc Af Amer: 48 mL/min — ABNORMAL LOW (ref >60)
GFR calc non Af Amer: 40 mL/min — ABNORMAL LOW (ref >60)
Potassium: 4.1 mEq/L (ref 3.5–5.1)
Total Bilirubin: 0.7 mg/dL (ref 0.3–1.2)
Total Bilirubin: 0.9 mg/dL (ref 0.3–1.2)

## 2010-10-31 LAB — URINE CULTURE
Colony Count: 45000
Colony Count: NO GROWTH

## 2010-10-31 LAB — URINALYSIS, ROUTINE W REFLEX MICROSCOPIC
Bilirubin Urine: NEGATIVE
Glucose, UA: NEGATIVE mg/dL
Glucose, UA: NEGATIVE mg/dL
Hgb urine dipstick: NEGATIVE
Ketones, ur: NEGATIVE mg/dL
Leukocytes, UA: NEGATIVE
Nitrite: NEGATIVE
Protein, ur: NEGATIVE mg/dL
Specific Gravity, Urine: 1.015 (ref 1.005–1.030)
Specific Gravity, Urine: 1.016 (ref 1.005–1.030)
Urobilinogen, UA: 0.2 mg/dL (ref 0.0–1.0)
pH: 5.5 (ref 5.0–8.0)
pH: 7 (ref 5.0–8.0)

## 2010-10-31 LAB — URINE MICROSCOPIC-ADD ON

## 2010-10-31 LAB — GLUCOSE, CAPILLARY: Glucose-Capillary: 115 mg/dL — ABNORMAL HIGH (ref 70–99)

## 2010-10-31 LAB — TYPE AND SCREEN

## 2010-10-31 LAB — PROTIME-INR: INR: 1 (ref 0.00–1.49)

## 2010-10-31 LAB — APTT: aPTT: 32 seconds (ref 24–37)

## 2010-11-01 LAB — DIFFERENTIAL
Eosinophils Relative: 2 % (ref 0–5)
Lymphocytes Relative: 24 % (ref 12–46)
Lymphs Abs: 1.8 10*3/uL (ref 0.7–4.0)
Monocytes Absolute: 0.7 10*3/uL (ref 0.1–1.0)
Monocytes Relative: 10 % (ref 3–12)

## 2010-11-01 LAB — COMPREHENSIVE METABOLIC PANEL
AST: 28 U/L (ref 0–37)
Albumin: 3.9 g/dL (ref 3.5–5.2)
Calcium: 12.4 mg/dL — ABNORMAL HIGH (ref 8.4–10.5)
Creatinine, Ser: 1.27 mg/dL — ABNORMAL HIGH (ref 0.4–1.2)
GFR calc Af Amer: 51 mL/min — ABNORMAL LOW (ref >60)
GFR calc non Af Amer: 42 mL/min — ABNORMAL LOW (ref >60)
Total Protein: 7.4 g/dL (ref 6.0–8.3)

## 2010-11-01 LAB — BODY FLUID CULTURE: Culture: NO GROWTH

## 2010-11-01 LAB — BODY FLUID CELL COUNT WITH DIFFERENTIAL
Lymphs, Fluid: 77 %
Total Nucleated Cell Count, Fluid: 620 cu mm (ref 0–1000)

## 2010-11-01 LAB — AFB CULTURE WITH SMEAR (NOT AT ARMC)

## 2010-11-01 LAB — LACTATE DEHYDROGENASE, PLEURAL OR PERITONEAL FLUID

## 2010-11-01 LAB — FUNGUS CULTURE W SMEAR

## 2010-11-01 LAB — CBC
MCHC: 34.8 g/dL (ref 30.0–36.0)
MCV: 86.1 fL (ref 78.0–100.0)
Platelets: 284 10*3/uL (ref 150–400)

## 2010-11-01 LAB — AMYLASE, BODY FLUID

## 2010-11-23 ENCOUNTER — Other Ambulatory Visit: Payer: Self-pay | Admitting: Oncology

## 2010-11-23 ENCOUNTER — Encounter (HOSPITAL_BASED_OUTPATIENT_CLINIC_OR_DEPARTMENT_OTHER): Payer: Medicare Other | Admitting: Oncology

## 2010-11-23 DIAGNOSIS — Z79899 Other long term (current) drug therapy: Secondary | ICD-10-CM

## 2010-11-23 DIAGNOSIS — Z9011 Acquired absence of right breast and nipple: Secondary | ICD-10-CM

## 2010-11-23 DIAGNOSIS — C50919 Malignant neoplasm of unspecified site of unspecified female breast: Secondary | ICD-10-CM

## 2010-11-23 LAB — COMPREHENSIVE METABOLIC PANEL
Alkaline Phosphatase: 65 U/L (ref 39–117)
BUN: 21 mg/dL (ref 6–23)
Glucose, Bld: 101 mg/dL — ABNORMAL HIGH (ref 70–99)
Sodium: 141 mEq/L (ref 135–145)
Total Bilirubin: 0.6 mg/dL (ref 0.3–1.2)

## 2010-12-04 NOTE — Discharge Summary (Signed)
Lisa, [name]             ACCOUNT NO.:  [id number]   MEDICAL RECORD NO.:  [id number]          PATIENT TYPE:  INP   LOCATION:  5511                         FACILITY:  [facility]   PHYSICIAN:  Oretha Milch, MD      DATE OF BIRTH:  May 12, 1941   DATE OF ADMISSION:  10/17/2008  DATE OF DISCHARGE:  10/18/2008                               DISCHARGE SUMMARY   DISCHARGE DIAGNOSIS:  Right pleural effusion.   HISTORY OF PRESENT ILLNESS:  This is a 70 year old female who recently  had a CT scan and chest x-ray in the outpatient radiology facility to  work up some shortness of breath that she had been having over the past  several weeks.  These studies showed that she had a large right pleural  effusion.  For this, she was sent to the emergency room for further  evaluation and management.  She was admitted for monitoring and for  anticipated right thoracentesis.   LABORATORY DATA:  Complete metabolic panel:  Sodium 138, potassium 3.3,  chloride 104, CO2 25, glucose 112, BUN 26, creatinine 1.27.  CBC showed  white blood cells of 7.7, hemoglobin 14.3, hematocrit 41, platelets 284.   RADIOGRAPHIC DATA:  CT scan performed on March 29 showed a large  malignant right pleural effusion with probable extensive right pleural  metastasis, questionable small nodule in the medial right breast.  Chest  x-ray on March 30 post thoracentesis showed decreased in right pleural  effusion with no pneumothorax.   PROCEDURES:  On March 30, right thoracentesis was performed by Devra Dopp, nurse practitioner, and Dr. Cyril Mourning under ultrasound  guidance.  Approximately 1550 cc of pleural fluid was drained and sent  for cell count, chemistries, cultures and cytology.  Follow-up chest x-  ray showed improvement and no pneumothorax.  The patient tolerated the  procedure well.   HOSPITAL COURSE BY DISCHARGE DIAGNOSIS:  Right pleural effusion.  This  is thought to be probably malignant.  Pleural fluid obtained  on  thoracentesis was sent for cytology as well as cultures and chemistries.  The patient is in stable condition with some improvement in her  shortness of breath.  Vital signs are stable.  She will follow up as an  outpatient with Dr. Vassie Loll on April 6 at 1:30 p.m.  This appointment time  was given to the patient.  The patient is also to follow up with her  primary care, Dr. Cato Mulligan.   DISCHARGE MEDICATIONS:  1. Tylenol 650 mg q.6 hours p.o. p.r.n. for pain.  2. Zocor 20 mg by mouth once daily.  3. Diovan HCT 160/25 by mouth once daily.  4. Claritin 10 mg 1 p.o. daily.  5. Caltrate 1 tab p.o. 3 times daily.  6. Multivitamin 1 p.o. daily.  7. Vitamin C 1 p.o. daily.  8. Vitamin E 1 p.o. daily.  9. Iron 27-mg tab 2 times per week.   These are all her regular home medications.   She is discharged on a regular diet.  She may increase activity as  tolerated.  Again, she is to follow up with  Dr. Vassie Loll in the office as  well as her primary care, Dr. Cato Mulligan, in the office.  She is discharged  in stable condition after thoracentesis for right pleural effusion and  will need an outpatient workup for this.     ______________________________  Dirk Dress, NP      Oretha Milch, MD  Electronically Signed    KW/MEDQ  D:  10/18/2008  T:  10/18/2008  Job:  [id number]   cc:   Valetta Mole. Swords, MD

## 2010-12-04 NOTE — Letter (Signed)
March 28, 2009   Barry Dienes. Eloise Harman, MD  [address]  [place], [place] [address]   Re:  LAURIN, PAULO             DOB:  [birth year]   Dear Jesusita Oka,   I saw the patient today, and we apparently did a right VATS with a talc  pleurodesis on her for malignant right pleural effusion.  She was sent  today for evaluation of this effusion and really is stable.  There is  some blunting in the right costal margin, but no evidence of recurrence  of her effusion.  She had good results from her talc pleurodesis.  Her  main symptoms are cough.  She may have either viral or bacterial  bronchitis.  She has been placed on doxycycline twice a day and will  follow up with you.  I will be happy to see her again if she has any  future problems.   Sincerely,   Ines Bloomer, M.D.  Electronically Signed   DPB/MEDQ  D:  03/28/2009  T:  03/29/2009  Job:  [id number]   cc:   Gaspar Garbe, M.D.

## 2010-12-04 NOTE — Discharge Summary (Signed)
NAMETORRY, ADAMCZAK NO.:  [id number]   MEDICAL RECORD NO.:  [id number]          PATIENT TYPE:  OUT   LOCATION:  XRAY                         FACILITY:  [facility]   PHYSICIAN:  Ines Bloomer, M.D. 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   SECONDARY DIAGNOSES:  1. Dyslipidemia.  2. History of allergic bronchitis.  3. Hypertension.  4. Osteopenia.  5. History of breast cancer, status post right breast mastectomy      approximately 10-12 years ago, status post chemotherapy.  6. Status post hysterectomy.   IN-HOSPITAL OPERATIONS/PROCEDURES:  Right video-assisted thoracoscopic  surgery with drainage of pleural effusion and talc pleurodesis.   HISTORY AND PHYSICAL AND HOSPITAL COURSE:  The patient is a 70 year old  female who has a history of breast cancer, status post right breast  mastectomy approximately 10-12 years ago with chemotherapy.  She now  presents with the right pleural effusion.  Pulmonary function test  showed an FVC of 1.71 with an FEV1 of 1.47.  Thoracentesis was done  which revealed malignant cells that are consistent with metastatic  carcinoma.  It was apparently thought that this was breast in origin.  She was seen by Dr. Darrold Span for evaluation.  Because of the rapid  recurrence of her pleural effusion, she was referred to Dr. Edwyna Shell for  treatment.  Dr. Edwyna Shell saw and evaluated the patient.  He discussed with  the patient regarding right VATS with drainage of pleural effusion and  talc pleurodesis.  He discussed the risks and benefits with patient.  The patient acknowledged understanding and agreed to proceed.  Surgery  was  scheduled for November 08, 2008.  For details of the patient's past  medical history and physical exam, please see dictated H and P.   The patient was taken to the operating room on November 08, 2008, where she  underwent right video-assisted thoracoscopic surgery with drainage of  pleural effusion and talc pleurodesis.  The patient tolerated this  procedure well and was transferred to the intensive care unit in stable  condition.  Her pleural pathology report showed positive for metastatic  carcinoma consistent with primary breast origin.  Pleural fluid showed  malignant cells consistent with metastatic adenocarcinoma.  Postoperatively, the patient was able to be extubated without  difficulty.  She was noted to be alert and oriented x4, neuro intact.  She was hemodynamically stable.  Her postoperative course was pretty  much unremarkable.  Daily chest x-rays obtained, which remain stable.  She had no drainage from chest tube.  Chest tube suction was decreased  on postop day 1 and discontinued and placed a water seal postop day 2.  By postop day 3, the patient had very minimal drainage from her right  chest tube and was felt to be safe for discharge.  The patient's right  chest  tube was discontinued on postop day 3.  Currently, awaiting  followup chest x-ray.  During this time the patient did develop a  temperature.  She has had 100.4 on postop day 2.  Her Foley was  discontinued.  Urine was sent, which showed some hemoglobin as well as  11-20 rbc's in the urine.  White blood cell count was checked and  stable.  Following day, her temperature increased to 102.0.  There are  no superficial wounds showing signs of infection.  She is told to  continue using her incentive spirometer.  We will recheck her white  blood cell count in the a.m.  Otherwise, the patient progressed well.  She is up and ambulating without difficulty.  She is tolerating diet  well.  No nausea, vomiting noted.  All incisions  looked clean, dry, and  intact and healing well.  She was noted to be in normal sinus rhythm.  Blood pressure is stable.   On postop day 3, the patient is noted to have a temperature of 102.  Currently following.  She is sating 99% on room air.  She is in sinus  rhythm to sinus tachycardiac 90s to low 100s.  Blood pressure is stable.   MOST RECENT LABORATORY WORK:  White blood cell count is 7.7, hemoglobin  of 9.9, hematocrit 28.1, and platelet count 182.  Sodium of 132,  potassium 4.1, chloride of 106, bicarbonate 22, BUN of 16, creatinine  1.33, and glucose of 129.  Currently awaiting for urine culture.   The patient is tentatively ready for discharge home in the next 24-48  hours, depending her fever resolves and she remains stable as well as  her x-ray stable.   FOLLOWUP APPOINTMENTS:  A followup appointment has been arranged with  Dr. Edwyna Shell for November 16, 2008, at 4 o'clock p.m.  The patient will need  to obtain PA and lateral chest x-rays 30 minutes prior to this  appointment.   ACTIVITY:  The patient instructed no driving until released to do so.  No lifting over 10 pounds.  She is told to ambulate 3-4 times per day.  Progress as tolerated and to continue her breathing exercises.   DIET:  The patient is to begin on diet to be low fat, low salt.   DISCHARGE MEDICATIONS:  1. Zocor 20 mg daily p.r.n.  2. Diovan 320 mg daily.  3. Claritin 10 mg daily.  4. Pravastatin 100 one to two tabs q.4-6 hours p.r.n. pain.      Theda Belfast, [place]      Ines Bloomer, M.D.  Electronically Signed    KMD/MEDQ  D:  11/11/2008  T:  11/12/2008  Job:  [id number]   cc:   Lennis P. Darrold Span, M.D.

## 2010-12-04 NOTE — H&P (Signed)
NAMECINTYA, DAUGHETY NO.:  [id number]   MEDICAL RECORD NO.:  [id number]          PATIENT TYPE:  INP   LOCATION:  1844                         FACILITY:  [facility]   PHYSICIAN:  Oretha Milch, MD      DATE OF BIRTH:  1941/06/02   DATE OF ADMISSION:  10/17/2008  DATE OF DISCHARGE:                              HISTORY & PHYSICAL   REASON FOR ADMISSION:  Malignant pleural effusion, shortness of breath.   HISTORY OF PRESENT ILLNESS:  This patient is a 70 year old female who  had a CT scan, chest x-ray, at an outpatient radiology facility to work  up some shortness of breath that she has been having over the past  several weeks.  The chest x-ray did show a large right pleural effusion,  for which she was sent to the emergency room and is now being admitted.  She does complain of some mild shortness of breath at rest and with  exertion.  She denies cough, denies fever, chest pain, hemoptysis.   REVIEW OF SYSTEMS:  Complete review of systems was done and was  negative, except for what is indicated in the HPI.   ALLERGIES:  She is allergic to CODEINE, BENADRYL, PERCOCET, TAMIFLU and  PREDNISONE, as stated per the patient.   PAST MEDICAL HISTORY:  1. Allergic rhinitis.  2. History of breast cancer.  3. Hyperlipidemia.  4. Hypertension.  5. Osteopenia.   CURRENT HOME MEDICATIONS:  1. Zocor 20 mg by mouth once daily.  2. Diovan/HCT 160/25 once by mouth daily.  3. Claritin 10 mg 1 p.o. daily.  4. Caltrate 3 times daily.  5. Multivitamins once daily.  6. Vitamin C once daily.  7. Vitamin E once daily.  8. Iron 27-mg tabs 2 times a week.  9. Potassium, dose unclear.   PAST SURGICAL HISTORY:  1. Hysterectomy.  2. Mastectomy with chemotherapy for breast cancer.   SOCIAL HISTORY:  She is single, retired.  Rare alcohol use.  Remote  former smoker, unknown pack-years.   FAMILY HISTORY:  Significant for her father is deceased with history of  afib, stroke and  pulmonary fibrosis.  Otherwise, noncontributory.   LAB DATA:  Complete metabolic panel:  Sodium 138, potassium 3.3,  chloride 104, CO2 of 25, glucose 112, BUN 26, creatinine 1.27.  CBC:  White count 7.7, hemoglobin 14.3, hematocrit 41, platelets 284.  CT  chest performed October 17, 2008 shows a large right malignant pleural  effusion with extensive right pleural metastases, questionable small  nodule in the right breast.  A chest x-ray performed October 12, 2008  shows opacity in the right lung base consistent with a moderate-sized  right pleural effusion.  Of note, patient had a chest x-ray done October  2008 which was clear of any pleural effusions.   EXAM:  NEURO:  Pleasant, cooperative, well developed, well nourished  Caucasian female, no acute distress.  She is alert and oriented x3.  HEENT:  Pupils are equal, round, and reactive to light and  accommodation.  Mucous membranes are moist.  No JVD, no lymphadenopathy,  no thyromegaly.  LUNGS:  Very diminished in the right base.  Otherwise, clear.  Some  upper airway wheezing was noted.  CARDIOLOGY:  S1, S2, regular rate and rhythm.  No murmurs, rubs, or  gallops heard.  ABDOMEN:  Soft, nontender, nondistended.  Positive bowel sounds.  No  organomegaly.  EXTREMITIES:  Warm and dry.  No edema noted.  Positive bilateral pedal  pulses palpable.   IMPRESSION AND PLAN:  Large right, probably malignant, pleural effusion.  This is most likely the cause of this patient's recent shortness of  breath.  Patient will be admitted to Dr. Vassie Loll.  She will scheduled for a  right thoracentesis by interventional radiology.  She will be admitted  to a regular bed.  She will be continued on her current home  medications.  Once the thoracentesis is performed, fluid will be sent  for cell count, LDH, protein, amylase, glucose, platelets, Gram-stain  culture, AFB and fungal smears, as well as cytology.  Patient will be  admitted to a regular medical bed and  we will plan for this diagnostic  test in the morning.     ______________________________  Dirk Dress, NP      Oretha Milch, MD  Electronically Signed    KW/MEDQ  D:  10/17/2008  T:  10/17/2008  Job:  [id number]

## 2010-12-04 NOTE — H&P (Signed)
NAMEGENEVIVE, PRINTUP             ACCOUNT NO.:  [id number]   MEDICAL RECORD NO.:  [id number]          PATIENT TYPE:  INP   LOCATION:  NA                           FACILITY:  [facility]   PHYSICIAN:  Ines Bloomer, M.D. DATE OF BIRTH:  Oct 20, 1940   DATE OF ADMISSION:  DATE OF DISCHARGE:                              HISTORY & PHYSICAL   CHIEF COMPLAINT:  Shortness of breath.   HISTORY OF PRESENT ILLNESS:  This 70 year old patient has a history of  having a right breast mastectomy approximately 10-12 years ago, now  presents with a right pleural effusion.  She had chemotherapy after  mastectomy.  Pulmonary function test showed an FVC of 1.71 with an FEV1  of 1.47.  Thoracentesis was done, which revealed malignant cells that  were consistent with metastatic carcinoma.  It was probably thought that  this was breast in origin.  She was seen by Dr. Darrold Span for evaluation.  Because of the rapid recurrence of her pleural effusions, she was  referred here for treatment of her pleural effusion.   ALLERGIES:  She is allergic to CODEINE, BENADRYL, PERCOCET, TAMIFLU, and  PREDNISONE.   MEDICATIONS:  Tylenol, Zocor 20 mg a day, Diovan 160/25 daily, Claritin  10 daily, Caltrate, vitamins, and iron.   PAST MEDICAL HISTORY:  She has dyslipidemia.  She has a history of  allergic bronchitis, hypertension, and osteopenia.  She had a previous  hysterectomy in 1983 and mastectomy in the late 90s.   FAMILY HISTORY:  Noncontributory.   SOCIAL HISTORY:  She quit smoking in December 1998.  Does not drink  alcohol on a regular basis.   REVIEW OF SYSTEMS:  She is 180 pounds.  She is 5 feet, 4 inches.  GENERAL:  Weight has been stable.  CARDIAC:  No angina or atrial  fibrillation.  PULMONARY:  No hemoptysis, history of asthma, or  wheezing.  See history of present illness.  GI:  She has hiatal hernia.  GU:  She has frequent urination secondary to diuretic.  No kidney  disease.  VASCULAR:  No claudication,  DVT, TIAs.  NEUROLOGIC:  No  dizziness, headaches, blackouts, or seizures.  MUSCULOSKELETAL:  No  arthritis.  PSYCHIATRIC:  No depression or nervous.  EYE/ENT:  No change  in her eyesight or hearing.  HEMATOLOGIC:  No problems with bleeding,  clotting disorders, or anemia.   PHYSICAL EXAMINATION:  GENERAL:  She is a well-developed Caucasian  female in no acute distress.  VITAL SIGNS:  Her blood pressure is 140/80, pulse 80, respirations 18,  sats were 94%.  HEENT:  Head is atraumatic.  Eyes, pupils equal, round, and reactive to  light and accommodation.  Extraocular movements normal.  Ears, tympanic  membranes are intact.  Nose, there is no septal deviation.  NECK:  Supple without thyromegaly.  CHEST:  Decreased breath sounds on the right.  Normal breath sounds on  the left.  HEART:  Regular sinus rhythm, no murmur.  ABDOMEN:  Soft.  There is no hepatosplenomegaly.  EXTREMITIES:  Pulses are 2+.  There is no clubbing or edema.  NEUROLOGIC:  She  is oriented x3.  Sensory and motor intact.  Cranial  nerves intact.   IMPRESSION:  1. Recurrent right pleural effusion malignant.  2. Dyslipidemia.  3. Hypertension.  4. History of breast cancer with right mastectomy and chemotherapy.   PLAN:  Drainage of pleural effusion, talc pleurodesis.      Ines Bloomer, M.D.  Electronically Signed     DPB/MEDQ  D:  11/06/2008  T:  11/07/2008  Job:  [id number]

## 2010-12-04 NOTE — Op Note (Signed)
Lisa [name], Lisa [name]             ACCOUNT NO.:  [id number]   MEDICAL RECORD NO.:  [id number]          PATIENT TYPE:  INP   LOCATION:  3309                         FACILITY:  [facility]   PHYSICIAN:  Ines Bloomer, M.D. DATE OF BIRTH:  1941-06-26   DATE OF PROCEDURE:  DATE OF DISCHARGE:  11/04/2008                               OPERATIVE REPORT   PREOPERATIVE DIAGNOSIS:  Recurrent right pleural effusion with probable  malignant right pleural effusion.   POSTOPERATIVE DIAGNOSIS:  Recurrent right pleural effusion with probable  malignant right pleural effusion.   OPERATION PERFORMED:  Right video-assisted thoracoscopy, drainage of  pleural effusion, pleural biopsy, talc pleurodesis.   SURGEON:  Ines Bloomer, MD.   ANESTHESIA:  General anesthesia.   This patient developed a right pleural effusion in which cytologies were  suspicious for adenocarcinoma.  She had recurrent effusions, brought to  the operating room for biopsy.  She has a history of breast cancer  approximately 10-15 years ago with a right mastectomy.  After general  anesthesia, she was prepped and draped in the usual sterile manner.  An  incision was made at the mid axillary line at the eighth intercostal  space and a trocar was inserted and we drained out approximately 1000 mL  of fluid.  We then took pictures of the pleura, which was studied with  multiple tumors.  Biopsies were done of some of these tumors and was a  malignant adenocarcinoma, possibly breast metastasis.  Multiple biopsies  were taken to send for receptors.  Then approximately 3-6 grams of talc  was insufflated though the trocar site.  A 28 chest tube was sutured in  place and Marcaine block was done in the usual fashion.  The lung was  reexpanded.  The patient was returned to the recovery room in stable  condition.      Ines Bloomer, M.D.  Electronically Signed     DPB/MEDQ  D:  11/08/2008  T:  11/09/2008  Job:  [id number]

## 2010-12-04 NOTE — Letter (Signed)
November 16, 2008   Lennis P. Darrold Span, MD  [address] [name]  [place], [place] [address]   Re:  Lisa [name], Lisa [name]             DOB:  03-27-1941   Dear [name],   I saw the patient back today and removed her chest tube stitches.  There  is some slight separation of her chest tube, but I think this should do,  and she will be  fine.  Her blood pressure was 123/78, pulse 100,  respirations 18, sats were 94%.  Her chest x-ray showed further  improvement of her reaction in the right chest where we placed talc.  Her biopsies showed adenocarcinoma consistent with breast, which were  estrogen positive and HER-2 negative and progesterone positive.  I will  see her back again in 1 week to check the healing of her chest tube  site, and she will see you this Friday.   Ines Bloomer, M.D.  Electronically Signed   DPB/MEDQ  D:  11/16/2008  T:  11/17/2008  Job:  [id number]   cc:   Oretha Milch, MD

## 2010-12-04 NOTE — Letter (Signed)
Nov 23, 2008   Lennis P. Darrold Span, MD  [address] [facility]  [place], [place] [address]   Re:  Lisa [name], Lisa [name]             DOB:  Sep 23, 1940   Dear [name],   I saw the patient today and her chest tube site has had some drainage,  but appears to be healing well.  Her lungs looked good.  She is going to  start her chemotherapy.  Overall, she is doing reasonably well.  Her  blood pressure is 141/78, pulse 97, respirations 18, and saturations  were 92%.  I will plan to see her back in 2 weeks with [name] chest x-ray to  check on the status of her chest tube site.   Ines Bloomer, M.D.  Electronically Signed   DPB/MEDQ  D:  11/23/2008  T:  11/24/2008  Job:  [id number]

## 2010-12-04 NOTE — Letter (Signed)
November 02, 2008   Comer Locket. Vassie Loll, MD  [address] [place] [address]   Re:  Lisa [name], Lisa [name]             DOB:  08-13-1940   Dear [name] Breach:   I saw the patient in the office today.  She had a breast cancer with  mastectomy done in 1998 and has done well, and that she started  developing shortness of breath and had a chest CT scan that showed a  right pleural effusion with pleural nodularity.  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  She also had a PET scan which showed  marked pleural involvement on the right side with involvement of the  lung and the fissure.  She is referred here for repeat pleural biopsy  and talc pleurodesis and pleurectomy.  She has had no fever, chills, or  excessive sputum.   Past medical history is significant for hypertension,  hypercholesterolemia.  She > is on Tylenol 650 mg daily, Zocor 20 mg  daily, Diovan 160/25 daily, and Claritin 10 mg daily.  She is allergic  to codeine, Benadryl, Tamiflu, and prednisone.  She had a hysterectomy.   FAMILY HISTORY:  Noncontributory.   SOCIAL HISTORY:  She quit smoking about 12-14 years ago.  She is single.  Does not drink alcohol on a regular basis.   REVIEW OF SYSTEMS:  She is 180 pounds.  She is 5 feet 4.  General:  Her  weight has been stable.  Cardiac:  No angina or atrial fibrillation.  Pulmonary:  No hemoptysis.  Just shortness of breath.  No wheezing or  asthma.  GI:  She has hiatal hernia and reflux that was exacerbated by  by prednisone.  GU:  She has frequent urination.  No kidney disease.  Vascular:  No claudication, DVT, or TIAs.  Neurological:  No dizziness,  headaches, blackouts, or seizures.  Musculoskeletal:  No arthritis,  joint pain.  Psychiatric:  No depression or nervousness.  Eyes/ENT:  No  changes in her eyesight or hearing.  Hematological:  No problems with  bleeding or clotting disorders or  anemia.   PHYSICAL EXAMINATION:  GENERAL:  She is a well-developed Caucasian  female in no acute distress.  VITAL SIGNS:  Her blood pressure is 120/80, pulse 90, saturations were  93%, and respirations were 20.  HEAD, EYES, EARS, NOSE, AND THROAT:  Unremarkable.  NECK:  Supple without thyromegaly.  There is no supraclavicular or  axillary adenopathy.  CHEST:  There is decreased breath sounds on the right side.  A  mastectomy site on the right side.  Left side is clear to auscultation  and percussion.  HEART:  Regular sinus rhythm.  No murmur.  ABDOMEN:  Soft.  No hepatosplenomegaly.  EXTREMITIES:  Pulses 2+.  There is no clubbing or edema.  NEUROLOGICAL:  She is oriented x3.  Sensory and motor intact.   I think she does have a marked amount of malignant effusion with pleural  nodularities and lung involvement.  We will plan to do a right VATS  drainage of pleural effusion, pleural biopsies, and talc pleurodesis.  We will plan to do this on the 20th at [facility].   Sincerely,   Ines Bloomer, M.D.  Electronically Signed   DPB/MEDQ  D:  11/02/2008  T:  11/03/2008  Job:  [id number]   cc:   Lennis P. Darrold Span,  M.D. 

## 2010-12-04 NOTE — Letter (Signed)
Dec 06, 2008   Lennis P. Darrold Span, MD  [address] [facility]  [place], [place] [address]   Re:  Lisa [name], Lisa [name]             DOB:  [birth year]   Dear [name],   I saw the patient back today.  Her VATS incision is well healed.  Her  chest x-ray is stable with minimal effusion.  She is doing well overall  from her VATS talc pleurodesis.  Her blood pressure is 136/88, pulse 93,  respirations 18, and saturations were 97%.  I will see her back again in  2 weeks with another chest x-ray.  I will see her back again if she has  any further problems.   Ines Bloomer, M.D.  Electronically Signed   DPB/MEDQ  D:  12/06/2008  T:  12/07/2008  Job:  [id number]

## 2011-03-11 ENCOUNTER — Ambulatory Visit
Admission: RE | Admit: 2011-03-11 | Discharge: 2011-03-11 | Disposition: A | Discharge status: Home / Self Care | Payer: Medicare Other | Source: Ambulatory Visit | Attending: Oncology | Admitting: Oncology

## 2011-03-11 DIAGNOSIS — Z9011 Acquired absence of right breast and nipple: Secondary | ICD-10-CM

## 2011-03-11 DIAGNOSIS — Z79899 Other long term (current) drug therapy: Secondary | ICD-10-CM

## 2011-03-20 ENCOUNTER — Other Ambulatory Visit: Payer: Self-pay | Admitting: Oncology

## 2011-03-20 ENCOUNTER — Encounter (HOSPITAL_BASED_OUTPATIENT_CLINIC_OR_DEPARTMENT_OTHER): Payer: Medicare Other | Admitting: Oncology

## 2011-03-20 DIAGNOSIS — M949 Disorder of cartilage, unspecified: Secondary | ICD-10-CM

## 2011-03-20 DIAGNOSIS — Z87891 Personal history of nicotine dependence: Secondary | ICD-10-CM

## 2011-03-20 DIAGNOSIS — C782 Secondary malignant neoplasm of pleura: Secondary | ICD-10-CM

## 2011-03-20 DIAGNOSIS — C50919 Malignant neoplasm of unspecified site of unspecified female breast: Secondary | ICD-10-CM

## 2011-03-20 LAB — CBC WITH DIFFERENTIAL/PLATELET
BASO%: 0.9 % (ref 0.0–2.0)
Basophils Absolute: 0 10*3/uL (ref 0.0–0.1)
EOS%: 4.9 % (ref 0.0–7.0)
HGB: 13.8 g/dL (ref 11.6–15.9)
MCH: 30.8 pg (ref 25.1–34.0)
MCHC: 35.3 g/dL (ref 31.5–36.0)
RDW: 13.5 % (ref 11.2–14.5)
WBC: 4 10*3/uL (ref 3.9–10.3)
lymph#: 1.4 10*3/uL (ref 0.9–3.3)

## 2011-03-20 LAB — COMPREHENSIVE METABOLIC PANEL
ALT: 23 U/L (ref 0–35)
AST: 25 U/L (ref 0–37)
Albumin: 4.4 g/dL (ref 3.5–5.2)
Calcium: 9.8 mg/dL (ref 8.4–10.5)
Chloride: 105 mEq/L (ref 96–112)
Potassium: 4.1 mEq/L (ref 3.5–5.3)

## 2011-03-22 ENCOUNTER — Encounter (HOSPITAL_BASED_OUTPATIENT_CLINIC_OR_DEPARTMENT_OTHER): Payer: Medicare Other | Admitting: Oncology

## 2011-03-22 DIAGNOSIS — C50919 Malignant neoplasm of unspecified site of unspecified female breast: Secondary | ICD-10-CM

## 2011-07-08 ENCOUNTER — Other Ambulatory Visit: Payer: Self-pay | Admitting: Oncology

## 2011-07-08 ENCOUNTER — Other Ambulatory Visit: Payer: Medicare Other

## 2011-07-08 DIAGNOSIS — E7889 Other lipoprotein metabolism disorders: Secondary | ICD-10-CM

## 2011-07-08 LAB — CBC WITH DIFFERENTIAL/PLATELET
Basophils Absolute: 0 10*3/uL (ref 0.0–0.1)
EOS%: 6.7 % (ref 0.0–7.0)
HCT: 41.6 % (ref 34.8–46.6)
HGB: 14.6 g/dL (ref 11.6–15.9)
LYMPH%: 39 % (ref 14.0–49.7)
MCH: 30.2 pg (ref 25.1–34.0)
MCV: 86.1 fL (ref 79.5–101.0)
MONO%: 12.4 % (ref 0.0–14.0)
NEUT%: 41.3 % (ref 38.4–76.8)
Platelets: 195 10*3/uL (ref 145–400)

## 2011-07-08 LAB — LIPID PANEL
Cholesterol: 161 mg/dL (ref 0–200)
HDL: 47 mg/dL (ref >39)
LDL Cholesterol: 67 mg/dL (ref 0–99)
Triglycerides: 236 mg/dL — ABNORMAL HIGH (ref <150)
VLDL: 47 mg/dL — ABNORMAL HIGH (ref 0–40)

## 2011-07-08 LAB — COMPREHENSIVE METABOLIC PANEL
AST: 27 U/L (ref 0–37)
Alkaline Phosphatase: 61 U/L (ref 39–117)
BUN: 20 mg/dL (ref 6–23)
Creatinine, Ser: 0.89 mg/dL (ref 0.50–1.10)
Glucose, Bld: 99 mg/dL (ref 70–99)

## 2011-07-12 ENCOUNTER — Telehealth: Payer: Self-pay | Admitting: Oncology

## 2011-07-12 ENCOUNTER — Ambulatory Visit (HOSPITAL_BASED_OUTPATIENT_CLINIC_OR_DEPARTMENT_OTHER): Payer: Medicare Other | Admitting: Oncology

## 2011-07-12 VITALS — BP 138/83 | HR 90 | Temp 98.6°F | Ht 64.0 in | Wt 194.2 lb

## 2011-07-12 DIAGNOSIS — C782 Secondary malignant neoplasm of pleura: Secondary | ICD-10-CM

## 2011-07-12 DIAGNOSIS — C50919 Malignant neoplasm of unspecified site of unspecified female breast: Secondary | ICD-10-CM

## 2011-07-12 DIAGNOSIS — J309 Allergic rhinitis, unspecified: Secondary | ICD-10-CM

## 2011-07-12 DIAGNOSIS — M949 Disorder of cartilage, unspecified: Secondary | ICD-10-CM

## 2011-07-12 DIAGNOSIS — Z170 Estrogen receptor positive status [ER+]: Secondary | ICD-10-CM

## 2011-07-12 DIAGNOSIS — M8580 Other specified disorders of bone density and structure, unspecified site: Secondary | ICD-10-CM

## 2011-07-12 DIAGNOSIS — E785 Hyperlipidemia, unspecified: Secondary | ICD-10-CM

## 2011-07-12 DIAGNOSIS — I10 Essential (primary) hypertension: Secondary | ICD-10-CM

## 2011-07-12 NOTE — Telephone Encounter (Signed)
gve the pt her march 2013 appt calendar 

## 2011-07-13 NOTE — Progress Notes (Signed)
OFFICE PROGRESS NOTE Date of Visit 07-12-2011 Physicians: D.Eloise Harman, R.Sharma  INTERVAL HISTORY:   Patient is seen, together with mother, in continuing attention to her metastatic breast carcinoma, which is being treated with Femara.   Her breast carcinoma initially was T2 N1, ER/PR-positive in December 1998, treated with right mastectomy with node evaluation   and adjuvant Adriamycin/Cytoxan followed by 5 years of tamoxifen through December 2003.  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 She has tolerated Femara well and clinically has continued to do very well, though CA 27.29 has never normalized. Last CT CAP and PET were in June 2011 and last CXR at [facility] Sept 2012 (report in [place]). The outside CXR reported sclerosis in right rib which may be related to the VATS procedure, as she has had transient discomfort there since the procedure. She had bone density scan Aug 2012 at [facility] with some improvement in osteopenia as compared with scan of 2011; she had left mammogram at [facility] 03-11-2011. Icela has been feeling well overall, without SOB or other respiratory symptoms other than brief sharp pain right posterior chest with sneeze as she has had. She denies other pain, has had less swelling in right knee, no fever or symptoms of infection since sinusitis/ bronchitis treated with amoxicillin/clavulonate in Sept 2012. She has allergic nasal congestion particularly at night. Remainder of 10 point ROS negative.    Objective:  Vital signs in last 24 hours:  BP 138/83  Pulse 90  Temp 98.6 F (37 C)  Ht 5\' 4"  (1.626 m)  Wt 194 lb 3.2 oz (88.089 kg)  BMI 33.33 kg/m2  ALert, talkative, easily mobile, good historian as always.  HEENT:mucous membranes moist, pharynx normal without lesions LymphaticsCervical, supraclavicular, and  axillary nodes normal. Resp: clear to auscultation bilaterally and normal percussion bilaterally. No skin rash, no tenderness to palpation right chest wall post/ lat. Cardio: regular rate and rhythm GI: soft, non-tender; bowel sounds normal; no masses,  no organomegaly Extremities: extremities normal, atraumatic, no cyanosis or edema Right mastectomy scar shows no evidence of local recurrence. Left breast slightly tender at 1200 without mass or skin findings. Nothing in either axilla, no swelling either UE.   Lab Results:  Labs done 07-08-2011: WBC 3.8, ANC 1.6, Hgb 14.6 and plt 195k  BMET/CMET also 07-08-2011 normal including glucose 99 and normal electrolytes, LFTs  and Ca++ CA 27-29 stable at 296. Fasting lipids drawn due to Femara and other diagnoses: triglycerides 236, HDL 47, VLDL 47, LDL 67 Studies/Results:  No results found.  Medications: I have reviewed the patient's current medications. Patient prefers not to have flu shot. Assessment/Plan:  1. Breast carcinoma: history as above, metastatic to pleura since early 2010, clinically continuing to do well on Femara now for over 2 1/2 years. I will see her back in 3 months or sooner if needed, CBC, CMET and CA 2729 with that visit. We will repeat imaging if symptoms or significant rise in marker, or other concerns.  2. Osteopenia: most recent bone density scan a little better even on the chronic high risk medication Femara. Yearly bone density scans are medically necessary. 3.Environmental allergies on chronic desensitization shots. 4.Degenerative arthritis knees 5.Elevated lipids: also a little better than last in our records, even on the aromatase inhibitor  Patient and mother were comfortable with plan as above  Travis Mastel P, MD   07/13/2011, 11:31 AM

## 2011-07-15 NOTE — Progress Notes (Signed)
Faxed labs from 07/08/11 to Dr. Jarome Matin at [phone] per Dr. Precious Reel request.

## 2011-07-21 ENCOUNTER — Encounter: Payer: Self-pay | Admitting: Oncology

## 2011-09-16 ENCOUNTER — Other Ambulatory Visit: Payer: Self-pay | Admitting: Oncology

## 2011-09-16 DIAGNOSIS — Z853 Personal history of malignant neoplasm of breast: Secondary | ICD-10-CM

## 2011-10-07 ENCOUNTER — Other Ambulatory Visit (HOSPITAL_BASED_OUTPATIENT_CLINIC_OR_DEPARTMENT_OTHER): Payer: Medicare Other | Admitting: Lab

## 2011-10-07 DIAGNOSIS — C782 Secondary malignant neoplasm of pleura: Secondary | ICD-10-CM

## 2011-10-07 DIAGNOSIS — C50919 Malignant neoplasm of unspecified site of unspecified female breast: Secondary | ICD-10-CM

## 2011-10-07 LAB — COMPREHENSIVE METABOLIC PANEL
ALT: 20 U/L (ref 0–35)
Alkaline Phosphatase: 63 U/L (ref 39–117)
Creatinine, Ser: 0.98 mg/dL (ref 0.50–1.10)
Sodium: 142 mEq/L (ref 135–145)
Total Bilirubin: 0.8 mg/dL (ref 0.3–1.2)
Total Protein: 6.8 g/dL (ref 6.0–8.3)

## 2011-10-11 ENCOUNTER — Ambulatory Visit (HOSPITAL_BASED_OUTPATIENT_CLINIC_OR_DEPARTMENT_OTHER): Payer: Medicare Other | Admitting: Oncology

## 2011-10-11 ENCOUNTER — Telehealth: Payer: Self-pay | Admitting: Oncology

## 2011-10-11 ENCOUNTER — Other Ambulatory Visit: Payer: Self-pay

## 2011-10-11 VITALS — BP 144/85 | HR 88 | Temp 98.9°F | Ht 64.0 in | Wt 196.9 lb

## 2011-10-11 DIAGNOSIS — Z853 Personal history of malignant neoplasm of breast: Secondary | ICD-10-CM

## 2011-10-11 DIAGNOSIS — C50919 Malignant neoplasm of unspecified site of unspecified female breast: Secondary | ICD-10-CM

## 2011-10-11 DIAGNOSIS — C782 Secondary malignant neoplasm of pleura: Secondary | ICD-10-CM

## 2011-10-11 DIAGNOSIS — I10 Essential (primary) hypertension: Secondary | ICD-10-CM

## 2011-10-11 DIAGNOSIS — M810 Age-related osteoporosis without current pathological fracture: Secondary | ICD-10-CM

## 2011-10-11 DIAGNOSIS — C50419 Malignant neoplasm of upper-outer quadrant of unspecified female breast: Secondary | ICD-10-CM

## 2011-10-11 MED ORDER — LETROZOLE 2.5 MG PO TABS: 2.5000 mg | ORAL_TABLET | Freq: Every day | ORAL | Status: DC

## 2011-10-11 NOTE — Progress Notes (Signed)
OFFICE PROGRESS NOTE Date of Visit 10-11-2011 Physicians: Cruz Condon, R.Sharma  INTERVAL HISTORY:  Patient is seen, together with mother, in scheduled follow up of her breast carcinoma which has been metastatic to pleura since early 2010 and clinically stable on Femara since April 2010. Her breast carcinoma initially was T2 N1, ER/PR-positive in December 1998, treated with right mastectomy with 12 node axillary evaluation and adjuvant Adriamycin/Cytoxan followed by 5 years of tamoxifen through December 2003. 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 She has tolerated Femara well and clinically has continued to do very well, though CA 27.29 has never normalized. Last CT CAP and PET were in June 2011 and last CXR at [facility] Sept 2012 (report in [place]). The outside CXR reported sclerosis in right rib which may be related to the VATS procedure, as she has had transient discomfort there since the procedure. She had bone density scan Aug 2012 at [facility] with some improvement in osteopenia as compared with scan of 2011; she had left mammogram at [facility] 03-11-2011. Patient has generally been doing well since she was here last, with no shortness of breath or lower respiratory symptoms and no chest pain. She has had soreness left hip which is improving since she has been careful with activity, possibly bursitis. She has no other new or different pain. Energy and appetite are good, blood pressure usually 130/ 70-80. Remainder of 10 point Review of Systems negative/ unchanged. She continues weekly allergy shots. Objective:  Vital signs in last 24 hours:  BP 144/85  Pulse 88  Temp(Src) 98.9 F (37.2 C) (Oral)  Ht 5\' 4"  (1.626 m)  Wt 196 lb 14.4 oz (89.313 kg)  BMI 33.80 kg/m2 Alert, easily mobile, looks well.  HEENT:mucous membranes moist, pharynx  normal without lesions LymphaticsCervical, supraclavicular, and axillary nodes normal. Resp: clear to auscultation bilaterally and normal percussion bilaterally Cardio: regular rate and rhythm GI: soft, non-tender; bowel sounds normal; no masses,  no organomegaly Extremities: extremities normal, atraumatic, no cyanosis or edema Neuro:no sensory deficits noted Skin without rash or bruises. Right mastectomy scar not remarkable. Left breast without masses, skin or nipple findings. Axillae benign.  Lab Results: labs all done 3-18 as follows: CMET normal with exception of blood sugar or 113 and BUN of 26, and CA 2729 in usual range at 283, unchanged for over a year.  Studies/Results:  No results found.  Medications: I have reviewed the patient's current medications. She is continuing the Femara. Patient is in agreement with repeating body CTs, which we will schedule just prior to my next visit in ~ June. Assessment/Plan: 1.Breast carcinoma: metastatic to pleura x 3 years, clinically doing well on Femara but with this duration of treatment I am most comfortable repeating scans; we have not added PET at least at this point. 2. Osteopenia: bone density scan will be due again Aug. If remains on high risk AI 3.new mild discomfort left hip which seems to be improving, ? Bursitis or other 4.environmental allergies 5.HTN 6.Past tobacco, DCd 1998 7.RT to adenoids 1965 Patient and her mother were comfortable with discussion and plan. Time spent 25 min.  Reece Packer, MD   10/11/2011, 9:19 PM

## 2011-10-11 NOTE — Patient Instructions (Signed)
CT in June. Prescription written for Femara

## 2011-10-11 NOTE — Telephone Encounter (Signed)
appts made and printed 

## 2011-10-12 ENCOUNTER — Encounter: Payer: Self-pay | Admitting: Oncology

## 2011-10-21 ENCOUNTER — Telehealth: Payer: Self-pay

## 2011-10-21 NOTE — Telephone Encounter (Signed)
FAXED SIGNED ORDER DATED 10-17-11 FOR DISPENSING MASTECTOMY SUPPLIES TO SECOND TO NATURE. SENT A COPY OF THE ORDER TO [employer] TO BE SCANNED INTO PT.'S EMR.

## 2011-10-28 ENCOUNTER — Telehealth: Payer: Self-pay

## 2011-10-28 NOTE — Telephone Encounter (Signed)
FAXED COMPLETED, REVIEWED, AND SIGNED EVALUATION AND ORDER  FOR MASTECTOMY PRODUCTS AS PER MEDICARE REQUIREMENTS. SENT A COPY TO [employer] TO BE SCANNED INTO PT.'S EMR.

## 2011-12-30 ENCOUNTER — Other Ambulatory Visit (HOSPITAL_BASED_OUTPATIENT_CLINIC_OR_DEPARTMENT_OTHER): Payer: Medicare Other | Admitting: Lab

## 2011-12-30 ENCOUNTER — Ambulatory Visit (HOSPITAL_COMMUNITY)
Admission: RE | Admit: 2011-12-30 | Discharge: 2011-12-30 | Disposition: A | Discharge status: Home / Self Care | Payer: Medicare Other | Source: Ambulatory Visit | Attending: Oncology | Admitting: Oncology

## 2011-12-30 DIAGNOSIS — C782 Secondary malignant neoplasm of pleura: Secondary | ICD-10-CM

## 2011-12-30 DIAGNOSIS — C50919 Malignant neoplasm of unspecified site of unspecified female breast: Secondary | ICD-10-CM | POA: Insufficient documentation

## 2011-12-30 LAB — CMP (CANCER CENTER ONLY)
AST: 25 U/L (ref 11–38)
Alkaline Phosphatase: 57 U/L (ref 26–84)
BUN, Bld: 22 mg/dL (ref 7–22)
Creat: 0.5 mg/dl — ABNORMAL LOW (ref 0.6–1.2)
Potassium: 4.5 mEq/L (ref 3.3–4.7)

## 2011-12-30 LAB — CBC WITH DIFFERENTIAL/PLATELET
Basophils Absolute: 0.1 10*3/uL (ref 0.0–0.1)
Eosinophils Absolute: 0.2 10*3/uL (ref 0.0–0.5)
LYMPH%: 36.9 % (ref 14.0–49.7)
MCV: 87.3 fL (ref 79.5–101.0)
MONO%: 11.6 % (ref 0.0–14.0)
NEUT#: 1.9 10*3/uL (ref 1.5–6.5)
NEUT%: 45.8 % (ref 38.4–76.8)
Platelets: 198 10*3/uL (ref 145–400)
RBC: 4.75 10*6/uL (ref 3.70–5.45)

## 2011-12-30 MED ORDER — IOHEXOL 300 MG/ML  SOLN
80.0000 mL | Freq: Once | INTRAMUSCULAR | Status: AC | PRN
  Administered 2011-12-30: 80 mL via INTRAVENOUS

## 2012-01-03 ENCOUNTER — Encounter: Payer: Self-pay | Admitting: Oncology

## 2012-01-03 ENCOUNTER — Ambulatory Visit (HOSPITAL_BASED_OUTPATIENT_CLINIC_OR_DEPARTMENT_OTHER): Payer: Medicare Other | Admitting: Oncology

## 2012-01-03 ENCOUNTER — Telehealth: Payer: Self-pay | Admitting: Oncology

## 2012-01-03 VITALS — BP 142/80 | HR 84 | Temp 99.0°F | Ht 64.0 in | Wt 195.3 lb

## 2012-01-03 DIAGNOSIS — C50919 Malignant neoplasm of unspecified site of unspecified female breast: Secondary | ICD-10-CM

## 2012-01-03 DIAGNOSIS — C782 Secondary malignant neoplasm of pleura: Secondary | ICD-10-CM

## 2012-01-03 DIAGNOSIS — M810 Age-related osteoporosis without current pathological fracture: Secondary | ICD-10-CM

## 2012-01-03 NOTE — Progress Notes (Signed)
OFFICE PROGRESS NOTE Date of Visit 01-03-12 Physicians: D.Eloise Harman, R.Sharma INTERVAL HISTORY:  Patient is seen, together with mother, in continuing attention to her metastatic breast carcinoma involving pleura, with restaging chest CT prior to this visit stable on present Femara. Her breast carcinoma initially was T2 N1, ER/PR-positive in December 1998, treated with right mastectomy with 12 node axillary evaluation and adjuvant Adriamycin/Cytoxan followed by 5 years of tamoxifen through December 2003. 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 She has tolerated Femara well and clinically has continued to do very well, though CA 27.29 has never normalized. Last CT CAP and PET were in June 2011 and last CXR at [facility] Sept 2012 (report in [place]). The outside CXR reported sclerosis in right rib which may be related to the VATS procedure, as she has had transient discomfort there since the procedure. She had bone density scan Aug 2012 at [facility] with some improvement in osteopenia as compared with scan of 2011; she had left mammogram at [facility] 03-11-2011. Patient has done well since she was here last with exception of vitreous/retinal separation OS. She has seen ophthalmology and did not need laser treatment.She began OTC oral B12, more energetic since this. Chronic arthritis in knees unchanged, no new or different pain. She has no shortness of breath, cough, chest pain and no changes at right mastectomy or left breast. Remainder of 10 point Review of Systems negative. Objective:  Vital signs in last 24 hours:  BP 142/80  Pulse 84  Temp 99 F (37.2 C) (Oral)  Ht 5\' 4"  (1.626 m)  Wt 195 lb 4.8 oz (88.587 kg)  BMI 33.52 kg/m2  Weight is down 1 lb. Easily mobile, looks comfortable, respirations not labored RA  HEENT:mucous membranes moist, pharynx  normal without lesions. PERRL, not icteric LymphaticsCervical, supraclavicular, and axillary nodes normal. Resp: clear to auscultation bilaterally and normal percussion bilaterally including right base. Cardio: regular rate and rhythm GI: soft, non-tender; bowel sounds normal; no masses,  no organomegaly Extremities: extremities normal, atraumatic, no cyanosis or edema Neuro:nonfocal Right mastectomy scar unremarkable, right axilla unremarkable, no swelling RUE. Left breast without mass, skin change, other findings of concern and left axilla not remarkable  Lab Results:from 12-30-11  WBC 4.2, ANC 1.9, Hgb 14.5, plt 198k BMET Full CMET normal with exception of creat 0.5  CA 2729 stable at 295, where it has been since at least March 2012 Studies/Results: CT chest with contrast 12-30-11 compared with 01-10-10 : no adenopathy, no effusion, stable changes on right from prior pleurodesis and slight pleural thickening stable to minimally improved, multilevel thoracic spondylosis and upper abdomen negative. We have discussed report and patient is given copy. Left mammogram and bone density ordered for Aug at [facility] Medications: I have reviewed the patient's current medications. Femara will need refill on the 3 month prescription shortly prior to next scheduled visit, this prescription given to patient # 90 1 RF which she will use in ~ August.  Assessment/Plan:  1. 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 I will see her back in 4 months or sooner if needed. 2.Vitreous separation OS recently 3.HTN: BPs at home 5-3 thru 01-03-12 112- 150/ 71 - 85. 4.osteopenia by last bone density scan: ordered again at [facility] for August 2013,  medically necessary on chronic high risk medication (Femara)  Patient was comfortable with discussion and plan for a little longer time between  visits.   Lisa [name] P, MD   01/03/2012, 8:22 PM

## 2012-01-03 NOTE — Telephone Encounter (Signed)
gv pt appt schedule for October lb/fu and August mammo/bone density @ the [facility].

## 2012-01-03 NOTE — Patient Instructions (Signed)
Continue present Femara. Call if needed prior to next scheduled appointment

## 2012-01-06 ENCOUNTER — Other Ambulatory Visit: Payer: Self-pay

## 2012-01-06 DIAGNOSIS — Z853 Personal history of malignant neoplasm of breast: Secondary | ICD-10-CM

## 2012-01-06 MED ORDER — LETROZOLE 2.5 MG PO TABS: 2.5000 mg | ORAL_TABLET | Freq: Every day | ORAL | Status: DC

## 2012-03-12 ENCOUNTER — Ambulatory Visit
Admission: RE | Admit: 2012-03-12 | Discharge: 2012-03-12 | Disposition: A | Discharge status: Home / Self Care | Payer: Medicare Other | Source: Ambulatory Visit | Attending: Oncology | Admitting: Oncology

## 2012-03-12 ENCOUNTER — Other Ambulatory Visit: Payer: Self-pay | Admitting: Oncology

## 2012-03-12 ENCOUNTER — Ambulatory Visit
Admission: RE | Admit: 2012-03-12 | Discharge: 2012-03-12 | Disposition: A | Discharge status: Home / Self Care | Payer: Medicare Other | Source: Ambulatory Visit

## 2012-03-12 DIAGNOSIS — C50919 Malignant neoplasm of unspecified site of unspecified female breast: Secondary | ICD-10-CM

## 2012-03-12 DIAGNOSIS — C782 Secondary malignant neoplasm of pleura: Secondary | ICD-10-CM

## 2012-04-27 ENCOUNTER — Other Ambulatory Visit: Payer: Medicare Other | Admitting: Lab

## 2012-04-27 DIAGNOSIS — C50919 Malignant neoplasm of unspecified site of unspecified female breast: Secondary | ICD-10-CM

## 2012-04-27 LAB — CBC WITH DIFFERENTIAL/PLATELET
Eosinophils Absolute: 0.2 10*3/uL (ref 0.0–0.5)
MCV: 87.3 fL (ref 79.5–101.0)
MONO%: 11.7 % (ref 0.0–14.0)
NEUT#: 3.6 10*3/uL (ref 1.5–6.5)
RBC: 4.62 10*6/uL (ref 3.70–5.45)
RDW: 13 % (ref 11.2–14.5)
WBC: 6 10*3/uL (ref 3.9–10.3)
lymph#: 1.4 10*3/uL (ref 0.9–3.3)

## 2012-04-27 LAB — COMPREHENSIVE METABOLIC PANEL (CC13)
AST: 26 U/L (ref 5–34)
Albumin: 3.9 g/dL (ref 3.5–5.0)
Alkaline Phosphatase: 63 U/L (ref 40–150)
Chloride: 108 mEq/L — ABNORMAL HIGH (ref 98–107)
Glucose: 125 mg/dl — ABNORMAL HIGH (ref 70–99)
Potassium: 4.1 mEq/L (ref 3.5–5.1)
Sodium: 139 mEq/L (ref 136–145)
Total Protein: 7 g/dL (ref 6.4–8.3)

## 2012-05-01 ENCOUNTER — Encounter: Payer: Self-pay | Admitting: Oncology

## 2012-05-01 ENCOUNTER — Telehealth: Payer: Self-pay | Admitting: Oncology

## 2012-05-01 ENCOUNTER — Ambulatory Visit (HOSPITAL_BASED_OUTPATIENT_CLINIC_OR_DEPARTMENT_OTHER): Payer: Medicare Other | Admitting: Oncology

## 2012-05-01 VITALS — BP 146/83 | HR 99 | Temp 99.3°F | Resp 20 | Ht 64.0 in | Wt 192.2 lb

## 2012-05-01 DIAGNOSIS — M949 Disorder of cartilage, unspecified: Secondary | ICD-10-CM

## 2012-05-01 DIAGNOSIS — C50919 Malignant neoplasm of unspecified site of unspecified female breast: Secondary | ICD-10-CM

## 2012-05-01 DIAGNOSIS — I10 Essential (primary) hypertension: Secondary | ICD-10-CM

## 2012-05-01 DIAGNOSIS — C782 Secondary malignant neoplasm of pleura: Secondary | ICD-10-CM

## 2012-05-01 NOTE — Progress Notes (Signed)
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  Her breast carcinoma initially was T2 N1, ER/PR-positive in December 1998, treated with right mastectomy with 12 node axillary evaluation and adjuvant Adriamycin/Cytoxan followed by 5 years of tamoxifen through December 2003. 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 She has tolerated Femara well and clinically has continued to do very well, though CA 27.29 has never normalized. Last CT CAP and PET were in June 2011 and last CT chest 12-2011. She had bone density scan at [facility] 03-12-12, this with stable osteopenia. She also had left mammogram at [facility] 03-16-12 predominately fatty and without findings of concern. (See both reports under studies below)   Patient has had more upper respiratory allergic symptoms and continued pain from known degenerative arthritis right knee. She has stable mild discomfort left groin thought related to left hip, that x several years. She has had no fever, no lower respiratory symptoms, no new or different pain, good appetite and energy at baseline. Remainder of 10 point Review of Systems negative/ unchanged.  Mother's youngest brother is having cardiac bypass surgery today. Objective:  Vital signs in last 24 hours:  BP 146/83  Pulse 99  Temp 99.3 F (37.4 C) (Oral)  Resp 20  Ht 5\' 4"  (1.626 m)  Wt 192 lb 3.2 oz (87.181 kg)  BMI 32.99 kg/m2Weight is down 3 lbs. Ambulatory without obvious difficulty, sounds a little nasally congested, otherwise appears comfortable.  HEENT:PERRLA, sclera clear, anicteric, oropharynx clear, no lesions and neck supple with midline trachea Normal hair  pattern LymphaticsCervical, supraclavicular, and axillary nodes normal. Resp: clear to auscultation bilaterally and normal percussion bilaterally Cardio: regular rate and rhythm GI: soft, non-tender; bowel sounds normal; no masses,  no organomegaly Extremities: no pedal edema, cords, tenderness. Right knee without heat or erythema. Some LE varicose veins. Neuro:nonfocal Breast:right:mastectomy site well healed without palpable abnormalities and axilla/RUE benign. Left breast without dominant mass, skin or nipple findings   Lab Results:  Results for orders placed in visit on 01/03/12  CBC WITH DIFFERENTIAL      Component Value Range   WBC 6.0  3.9 - 10.3 10e3/uL   NEUT# 3.6  1.5 - 6.5 10e3/uL   HGB 14.2  11.6 - 15.9 g/dL   HCT 81.1  91.4 - 78.2 %   Platelets 231  145 - 400 10e3/uL   MCV 87.3  79.5 - 101.0 fL   MCH 30.6  25.1 - 34.0 pg   MCHC 35.0  31.5 - 36.0 g/dL   RBC 9.56  2.13 - 0.86 10e6/uL   RDW 13.0  11.2 - 14.5 %   lymph# 1.4  0.9 - 3.3 10e3/uL   MONO# 0.7  0.1 - 0.9 10e3/uL   Eosinophils Absolute 0.2  0.0 - 0.5 10e3/uL   Basophils Absolute 0.1  0.0 - 0.1 10e3/uL   NEUT% 59.3  38.4 - 76.8 %   LYMPH% 24.2  14.0 - 49.7 %   MONO% 11.7  0.0 - 14.0 %   EOS% 3.7  0.0 - 7.0 %   BASO% 1.1  0.0 - 2.0 %  COMPREHENSIVE METABOLIC PANEL (CC13)      Component Value Range   Sodium 139  136 - 145 mEq/L   Potassium 4.1  3.5 - 5.1 mEq/L   Chloride 108 (*) 98 - 107 mEq/L   CO2 20 (*) 22 - 29 mEq/L   Glucose 125 (*) 70 - 99 mg/dl   BUN 78.2  7.0 - 95.6 mg/dL   Creatinine 0.9  0.6 - 1.1 mg/dL   Total Bilirubin 2.13  0.20 - 1.20 mg/dL   Alkaline Phosphatase 63  40 - 150 U/L   AST 26  5 - 34 U/L   ALT 28  0 - 55 U/L   Total Protein 7.0  6.4 - 8.3 g/dL   Albumin 3.9  3.5 - 5.0 g/dL   Calcium 9.8  8.4 - 08.6 mg/dL  CANCER ANTIGEN 57.84      Component Value Range   CA 27.29 299 (*) 0 - 39 U/mL    This marker is essentially stable. Studies/Results:  LEFT DIGITAL SCREENING  MAMMOGRAM WITH CAD 03-16-12 Comparison: None.  Findings: The patient has had a right mastectomy. Views of the  left breast demonstrate predominately fatty tissue. No suspicious  masses, architectural distortion, or calcifications are present.  Images were processed with CAD.  IMPRESSION:  No evidence of malignancy. Screening mammography is recommended in  one year.  RECOMMENDATION:  Screening mammogram in one year. (Code:SM-B-01Y)  BI-RADS CATEGORY 1: Negative.  Original Report Authenticated By: Rolla Plate, M.   Bone Density Scan 03-12-12 AP LUMBAR SPINE L1-L4  Bone Mineral Density (BMD): 0. 927 g/cm2  Young Adult T Score: -1.1  Z Score: 0.9  LEFT FEMUR NECK  Bone Mineral Density (BMD): 0.691 g/cm2  Young Adult T Score: -1.4  Z Score: 0.3  ASSESSMENT: Patient's diagnostic category is LOW BONE MASS by WHO  Criteria.  FRACTURE RISK: MODERATE  FRAX: Based on the World Health Organization FRAX model, the 10  year probability of a major osteoporotic fracture is 9.1%. The 10  year probability of a hip fracture is 1.1%.  Comparison: 03/11/2011, 03/09/2010, 03/08/2009. There has been  2.6% increase in bone mineral density over the baseline exam at the  lumbar spine which is statistically significant but could reflect  degenerative change. No statistically significant change over the  most recent previous exam. At the left total femur, there has been  3.3% increase in bone mineral density over the baseline exam which  is statistically significant. 1.3% loss of bone mineral density at  the left total femur since the most recent previous exams are is  not significant statistically.  Medications: I have reviewed the patient's current medications. Femara prescription given to patient as she prefers this to electronic, for 90 days with refill. Patient refuses flu vaccine.  Assessment/Plan: 1. 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 I will see her back in 4 months or sooner if needed. 2.osteopenia as above. Yearly bone density scans appropriate on continued high risk aromatase inhibitor 3.HTN 4.degenerative arthritis 5.environmental allergies  Patient and mother were comfortable with discussion and plan.   LIVESAY,LENNIS P, MD   05/01/2012, 10:37 AM

## 2012-05-01 NOTE — Telephone Encounter (Signed)
appts made and printed for pt aom °

## 2012-05-01 NOTE — Patient Instructions (Signed)
[employer]

## 2012-05-05 ENCOUNTER — Other Ambulatory Visit: Payer: Self-pay | Admitting: *Deleted

## 2012-05-05 DIAGNOSIS — Z853 Personal history of malignant neoplasm of breast: Secondary | ICD-10-CM

## 2012-05-05 MED ORDER — LETROZOLE 2.5 MG PO TABS: 2.5000 mg | ORAL_TABLET | Freq: Every day | ORAL | Status: DC

## 2012-08-31 ENCOUNTER — Other Ambulatory Visit (HOSPITAL_BASED_OUTPATIENT_CLINIC_OR_DEPARTMENT_OTHER): Payer: Medicare Other

## 2012-08-31 DIAGNOSIS — C50919 Malignant neoplasm of unspecified site of unspecified female breast: Secondary | ICD-10-CM

## 2012-08-31 DIAGNOSIS — C782 Secondary malignant neoplasm of pleura: Secondary | ICD-10-CM

## 2012-08-31 LAB — CBC WITH DIFFERENTIAL/PLATELET
BASO%: 1.3 % (ref 0.0–2.0)
LYMPH%: 33.1 % (ref 14.0–49.7)
MCHC: 34.1 g/dL (ref 31.5–36.0)
MCV: 86.6 fL (ref 79.5–101.0)
MONO%: 11.4 % (ref 0.0–14.0)
Platelets: 186 10*3/uL (ref 145–400)
RBC: 4.86 10*6/uL (ref 3.70–5.45)
WBC: 4.4 10*3/uL (ref 3.9–10.3)

## 2012-08-31 LAB — COMPREHENSIVE METABOLIC PANEL (CC13)
ALT: 32 U/L (ref 0–55)
Alkaline Phosphatase: 63 U/L (ref 40–150)
Sodium: 143 mEq/L (ref 136–145)
Total Bilirubin: 0.74 mg/dL (ref 0.20–1.20)
Total Protein: 7.3 g/dL (ref 6.4–8.3)

## 2012-09-03 ENCOUNTER — Telehealth: Payer: Self-pay | Admitting: Oncology

## 2012-09-03 NOTE — Telephone Encounter (Signed)
Pt called to r/s appt to 02/28.

## 2012-09-04 ENCOUNTER — Ambulatory Visit: Payer: Medicare Other | Admitting: Oncology

## 2012-09-18 ENCOUNTER — Ambulatory Visit (HOSPITAL_BASED_OUTPATIENT_CLINIC_OR_DEPARTMENT_OTHER): Payer: Medicare Other | Admitting: Oncology

## 2012-09-18 ENCOUNTER — Encounter: Payer: Self-pay | Admitting: Oncology

## 2012-09-18 ENCOUNTER — Telehealth: Payer: Self-pay | Admitting: Oncology

## 2012-09-18 VITALS — BP 170/89 | HR 89 | Temp 99.0°F | Resp 18 | Ht 64.0 in | Wt 199.6 lb

## 2012-09-18 NOTE — Patient Instructions (Signed)
We will check fasting lipids with next labs in June

## 2012-09-18 NOTE — Telephone Encounter (Signed)
Gave pt appt for lab and MD on June 2014  °

## 2012-09-18 NOTE — Progress Notes (Signed)
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   Her breast carcinoma initially was T2 N1, ER/PR-positive in December 1998, treated with right mastectomy with 12 node axillary evaluation and adjuvant Adriamycin/Cytoxan followed by 5 years of tamoxifen through December 2003. 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 She has tolerated Femara well and clinically has continued to do very well, though CA 27.29 has never normalized. Last CT CAP and PET were in June 2011 and last CT chest 12-2011. She had bone density scan at [facility] 03-12-12, this with stable osteopenia, and left mammogram at [facility] 03-16-12 without findings of concern.  Patient has had no new or different problems since she was here last 4 months ago. She denies lower respiratory symptoms, shortness of breath or chest pain. She has ongoing upper respiratory allergic symptoms. She has pain in knees, R>L, no other pain. She is aware of the slight ridge in left upper breast, occasional discomfort as previously. She has had no infectious illness. She cannot exercise due to knees.  Remainder of 10 point Review of Systems negative.  Objective:  Vital signs in last 24 hours:  BP 170/89  Pulse 89  Temp(Src) 99 F (37.2 C) (Oral)  Resp 18  Ht 5\' 4"  (1.626 m)  Wt 199 lb 9.6 oz (90.538 kg)  BMI 34.24 kg/m2  weiht is up 7 lbs. Ambulatory, appears comfortable, respirations not labored RA  HEENT:PERRLA, sclera clear, anicteric and oropharynx clear, no lesions.  LymphaticsCervical, supraclavicular, and axillary nodes normal. Resp: clear to auscultation bilaterally and normal  percussion bilaterally Back nontender Cardio: regular rate and rhythm GI: soft, non-tender; bowel sounds normal; no masses,  no organomegaly Extremities: extremities normal, atraumatic, no cyanosis or edema Neuro:nonfocal Breast:right mastectomy scar well healed without evidence of local recurrence. Left breast without dominant mass, skin or nipple findings. Thin ridge across upper lateral breast from ~ 1:00- 2:00 unchanged and does not feel concerning. Skin without rash or ecchymosis.  Lab Results:  Results for orders placed in visit on 08/31/12  CBC WITH DIFFERENTIAL      Result Value Range   WBC 4.4  3.9 - 10.3 10e3/uL   NEUT# 2.1  1.5 - 6.5 10e3/uL   HGB 14.3  11.6 - 15.9 g/dL   HCT 14.7  82.9 - 56.2 %   Platelets 186  145 - 400 10e3/uL   MCV 86.6  79.5 - 101.0 fL   MCH 29.5  25.1 - 34.0 pg   MCHC 34.1  31.5 - 36.0 g/dL   RBC 1.30  8.65 - 7.84 10e6/uL   RDW 13.3  11.2 - 14.5 %   lymph# 1.5  0.9 - 3.3 10e3/uL   MONO# 0.5  0.1 - 0.9 10e3/uL   Eosinophils Absolute 0.3  0.0 - 0.5 10e3/uL   Basophils Absolute 0.1  0.0 - 0.1 10e3/uL   NEUT% 48.1  38.4 - 76.8 %   LYMPH% 33.1  14.0 - 49.7 %   MONO% 11.4  0.0 - 14.0 %   EOS% 6.1  0.0 - 7.0 %   BASO% 1.3  0.0 - 2.0 %  COMPREHENSIVE METABOLIC PANEL (CC13)  Result Value Range   Sodium 143  136 - 145 mEq/L   Potassium 4.3  3.5 - 5.1 mEq/L   Chloride 107  98 - 107 mEq/L   CO2 27  22 - 29 mEq/L   Glucose 103 (*) 70 - 99 mg/dl   BUN 16.1  7.0 - 09.6 mg/dL   Creatinine 1.0  0.6 - 1.1 mg/dL   Total Bilirubin 0.45  0.20 - 1.20 mg/dL   Alkaline Phosphatase 63  40 - 150 U/L   AST 25  5 - 34 U/L   ALT 32  0 - 55 U/L   Total Protein 7.3  6.4 - 8.3 g/dL   Albumin 3.8  3.5 - 5.0 g/dL   Calcium 40.9  8.4 - 81.1 mg/dL  CANCER ANTIGEN 91.47      Result Value Range   CA 27.29 291 (*) 0 - 39 U/mL   CA 2729 marker is stable  Last fasting lipids were in Dec 2012, not done since then by PCP. We will repeat fasting lipids on the aromatase  inhibitor with labs here in 4 months.   Studies/Results:  Will be due left mammogram and bone density scan in Aug 2014.  Medications: I have reviewed the patient's current medications. She will continue Femara and calcium with D  Assessment/Plan:  1.1. 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 I will see her back in 4 months or sooner if needed. Will not do routine scans now as she is clinically very stable. WIll check fasting lipids on the AI at next visit. 2.osteopenia. Yearly bone density scans appropriate on continued high risk aromatase inhibitor and we will schedule this at next visit 3.HTN  4.degenerative arthritis  5.environmental allergies followed by Dr Lasandra Beech, MD   09/18/2012, 2:12 PM

## 2013-01-13 ENCOUNTER — Other Ambulatory Visit (HOSPITAL_BASED_OUTPATIENT_CLINIC_OR_DEPARTMENT_OTHER): Payer: Medicare Other

## 2013-01-13 DIAGNOSIS — C50911 Malignant neoplasm of unspecified site of right female breast: Secondary | ICD-10-CM

## 2013-01-13 DIAGNOSIS — C50919 Malignant neoplasm of unspecified site of unspecified female breast: Secondary | ICD-10-CM

## 2013-01-13 DIAGNOSIS — E785 Hyperlipidemia, unspecified: Secondary | ICD-10-CM

## 2013-01-13 LAB — CBC WITH DIFFERENTIAL/PLATELET
BASO%: 1.4 % (ref 0.0–2.0)
HCT: 40.6 % (ref 34.8–46.6)
LYMPH%: 33.2 % (ref 14.0–49.7)
MCH: 30.1 pg (ref 25.1–34.0)
MCHC: 35.6 g/dL (ref 31.5–36.0)
MONO#: 0.5 10*3/uL (ref 0.1–0.9)
NEUT%: 46.3 % (ref 38.4–76.8)
Platelets: 202 10*3/uL (ref 145–400)

## 2013-01-13 LAB — COMPREHENSIVE METABOLIC PANEL (CC13)
ALT: 28 U/L (ref 0–55)
AST: 29 U/L (ref 5–34)
Alkaline Phosphatase: 57 U/L (ref 40–150)
CO2: 21 mEq/L — ABNORMAL LOW (ref 22–29)
Sodium: 140 mEq/L (ref 136–145)
Total Bilirubin: 1.29 mg/dL — ABNORMAL HIGH (ref 0.20–1.20)
Total Protein: 7.5 g/dL (ref 6.4–8.3)

## 2013-01-13 LAB — LIPID PANEL
HDL: 47 mg/dL (ref >39)
LDL Cholesterol: 61 mg/dL (ref 0–99)
VLDL: 42 mg/dL — ABNORMAL HIGH (ref 0–40)

## 2013-01-18 ENCOUNTER — Ambulatory Visit (HOSPITAL_BASED_OUTPATIENT_CLINIC_OR_DEPARTMENT_OTHER): Payer: Medicare Other | Admitting: Oncology

## 2013-01-18 ENCOUNTER — Other Ambulatory Visit: Payer: Self-pay | Admitting: *Deleted

## 2013-01-18 ENCOUNTER — Telehealth: Payer: Self-pay | Admitting: Oncology

## 2013-01-18 ENCOUNTER — Encounter: Payer: Self-pay | Admitting: Oncology

## 2013-01-18 VITALS — BP 154/96 | HR 86 | Temp 99.2°F | Resp 20 | Ht 64.0 in | Wt 196.5 lb

## 2013-01-18 DIAGNOSIS — Z853 Personal history of malignant neoplasm of breast: Secondary | ICD-10-CM

## 2013-01-18 DIAGNOSIS — C50919 Malignant neoplasm of unspecified site of unspecified female breast: Secondary | ICD-10-CM | POA: Insufficient documentation

## 2013-01-18 DIAGNOSIS — C782 Secondary malignant neoplasm of pleura: Secondary | ICD-10-CM

## 2013-01-18 DIAGNOSIS — Z1231 Encounter for screening mammogram for malignant neoplasm of breast: Secondary | ICD-10-CM

## 2013-01-18 DIAGNOSIS — C50911 Malignant neoplasm of unspecified site of right female breast: Secondary | ICD-10-CM

## 2013-01-18 MED ORDER — LETROZOLE 2.5 MG PO TABS: 2.5000 mg | ORAL_TABLET | Freq: Every day | ORAL | Status: DC

## 2013-01-18 NOTE — Patient Instructions (Addendum)
We will send the letrozole prescription to [facility]

## 2013-01-18 NOTE — Progress Notes (Signed)
OFFICE PROGRESS NOTE   01/18/2013   Physicians: Jarome Matin, R.Willadean Carol  INTERVAL HISTORY:   Patient is seen, together with mother, in continuing attention to breast carcinoma metastatic to pleura, which clinically remains well controlled on Femara since April 2010.   Oncologic History The right breast carcinoma initially was T2 N1, ER/PR-positive in December 1998, treated with mastectomy with 12 node axillary evaluation and adjuvant Adriamycin/Cytoxan followed by 5 years of tamoxifen through December 2003. 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 She has tolerated Femara well and clinically has continued to do very well, though CA 27.29 has never normalized. Last CT CAP and PET were in June 2011 and last CT chest 12-2011. She had bone density scan at [facility] 03-12-12, with osteopenia stable to slightly improved, T score -1.1 in LS spine. Left digital screening mammogram was at [facility] 03-12-12, tissue predominately fatty.    Ongoing problems with knees which limit activity; no new or different pain otherwise. No respiratory complaints or chest discomfort. Appetite good. No recent infectious illness. No bleeding. Energy at baseline.  Remainder of 10 point Review of Systems negative.  Objective:  Vital signs in last 24 hours:  BP 154/96  Pulse 86  Temp(Src) 99.2 F (37.3 C) (Oral)  Resp 20  Ht 5\' 4"  (1.626 m)  Wt 196 lb 8 oz (89.132 kg)  BMI 33.71 kg/m2  Weight is up 3 lbs. Ambulatory without assistance  HEENT:PERRLA, sclera clear, anicteric and oropharynx clear, no lesions. Neck supple without JVD. Normal hair pattern LymphaticsCervical, supraclavicular, and axillary nodes normal. Resp: clear to percussion and breath sounds heart to bases bilaterally, minimally diminished right base. Cardio: regular rate and rhythm GI: soft,  non-tender; bowel sounds normal; no masses,  no organomegaly Extremities: extremities normal, atraumatic, no cyanosis or edema including RUE Neuro: nonfocal Breast:right mastectomy scar unremarkable for local recurrence, right axilla likewise. Left breast without dominant mass, skin or nipple findings and axilla benign. No central catheter  Lab Results:  Results for orders placed in visit on 01/13/13  CBC WITH DIFFERENTIAL      Result Value Range   WBC 3.8 (*) 3.9 - 10.3 10e3/uL   NEUT# 1.8  1.5 - 6.5 10e3/uL   HGB 14.4  11.6 - 15.9 g/dL   HCT 30.8  65.7 - 84.6 %   Platelets 202  145 - 400 10e3/uL   MCV 84.6  79.5 - 101.0 fL   MCH 30.1  25.1 - 34.0 pg   MCHC 35.6  31.5 - 36.0 g/dL   RBC 9.62  9.52 - 8.41 10e6/uL   RDW 13.7  11.2 - 14.5 %   lymph# 1.3  0.9 - 3.3 10e3/uL   MONO# 0.5  0.1 - 0.9 10e3/uL   Eosinophils Absolute 0.2  0.0 - 0.5 10e3/uL   Basophils Absolute 0.1  0.0 - 0.1 10e3/uL   NEUT% 46.3  38.4 - 76.8 %   LYMPH% 33.2  14.0 - 49.7 %   MONO% 13.2  0.0 - 14.0 %   EOS% 5.9  0.0 - 7.0 %   BASO% 1.4  0.0 - 2.0 %  CANCER ANTIGEN 27.29      Result Value Range   CA 27.29 294 (*) 0 - 39 U/mL  LIPID PANEL      Result Value Range   Cholesterol 150  0 - 200 mg/dL  Triglycerides 210 (*) <150 mg/dL   HDL 47  >16 mg/dL   Total CHOL/HDL Ratio 3.2     VLDL 42 (*) 0 - 40 mg/dL   LDL Cholesterol 61  0 - 99 mg/dL  COMPREHENSIVE METABOLIC PANEL (CC13)      Result Value Range   Sodium 140  136 - 145 mEq/L   Potassium 3.9  3.5 - 5.1 mEq/L   Chloride 108 (*) 98 - 107 mEq/L   CO2 21 (*) 22 - 29 mEq/L   Glucose 101 (*) 70 - 99 mg/dl   BUN 10.9  7.0 - 60.4 mg/dL   Creatinine 1.1  0.6 - 1.1 mg/dL   Total Bilirubin 5.40 (*) 0.20 - 1.20 mg/dL   Alkaline Phosphatase 57  40 - 150 U/L   AST 29  5 - 34 U/L   ALT 28  0 - 55 U/L   Total Protein 7.5  6.4 - 8.3 g/dL   Albumin 4.0  3.5 - 5.0 g/dL   Calcium 9.8  8.4 - 98.1 mg/dL    The Ca 1914 is essentially stable  Fasting lipids  also done 01-13-13 due to the aromatase inhibitor, with cholesterol 150, triglycerides 210,  HDL 47, total cholesterol/ HDL ratio 3.2, VLDL 42, LDL cholesterol 61   Studies/Results:  No results found. She will have bone density scan with left mammogram in August. We have discussed repeating scans after she establishes with next medical oncologist.  Medications: I have reviewed the patient's current medications.  Assessment/Plan:  1.1. 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 Consider repeat scans when she establishes with next medical oncologist 2.osteopenia. Yearly bone density scans necessary on continued high risk aromatase inhibitor  3.HTN stable 4.degenerative arthritis more bothersome in knees and she may schedule back with orthopedist 5.environmental allergies on desensitization shots by Dr [place] Callas   Patient and mother express appreciation for care.    Caitrin Pendergraph P, MD   01/18/2013, 12:55 PM

## 2013-01-18 NOTE — Telephone Encounter (Signed)
gv and printed appt sched and avs for pt....sched pt for mammo and bone density on 8.28.14 @ 10:45am

## 2013-03-18 ENCOUNTER — Ambulatory Visit
Admission: RE | Admit: 2013-03-18 | Discharge: 2013-03-18 | Disposition: A | Discharge status: Home / Self Care | Payer: Medicare Other | Source: Ambulatory Visit | Attending: Oncology | Admitting: Oncology

## 2013-03-18 ENCOUNTER — Telehealth: Payer: Self-pay | Admitting: *Deleted

## 2013-03-18 ENCOUNTER — Telehealth: Payer: Self-pay | Admitting: Oncology

## 2013-03-18 DIAGNOSIS — Z853 Personal history of malignant neoplasm of breast: Secondary | ICD-10-CM

## 2013-03-18 DIAGNOSIS — Z1231 Encounter for screening mammogram for malignant neoplasm of breast: Secondary | ICD-10-CM

## 2013-03-18 NOTE — Telephone Encounter (Signed)
PT. HAS AN APPOINTMENT WITH DR.LIVESAY ON 05/19/13.

## 2013-03-26 ENCOUNTER — Telehealth: Payer: Self-pay

## 2013-03-26 NOTE — Telephone Encounter (Signed)
Message copied by Lorine Bears on Fri Mar 26, 2013  5:55 PM ------      Message from: Reece Packer      Created: Fri Mar 26, 2013 12:13 PM       Labs seen and need follow up   Please let her know bone density is stable from a year ago, still just slightly low in osteopenic range.      Cc LA, TH ------

## 2013-03-26 NOTE — Telephone Encounter (Signed)
Told Ms. Alberty the results of the Bone Density test as noted below by Dr. Darrold Span.

## 2013-05-14 ENCOUNTER — Other Ambulatory Visit (HOSPITAL_BASED_OUTPATIENT_CLINIC_OR_DEPARTMENT_OTHER): Payer: Medicare Other | Admitting: Lab

## 2013-05-14 DIAGNOSIS — C782 Secondary malignant neoplasm of pleura: Secondary | ICD-10-CM

## 2013-05-14 DIAGNOSIS — C50919 Malignant neoplasm of unspecified site of unspecified female breast: Secondary | ICD-10-CM

## 2013-05-14 DIAGNOSIS — Z853 Personal history of malignant neoplasm of breast: Secondary | ICD-10-CM

## 2013-05-14 LAB — CBC WITH DIFFERENTIAL/PLATELET
BASO%: 1.4 % (ref 0.0–2.0)
Basophils Absolute: 0.1 10*3/uL (ref 0.0–0.1)
EOS%: 4.5 % (ref 0.0–7.0)
Eosinophils Absolute: 0.2 10*3/uL (ref 0.0–0.5)
HCT: 43 % (ref 34.8–46.6)
HGB: 14.7 g/dL (ref 11.6–15.9)
LYMPH%: 33.6 % (ref 14.0–49.7)
MCH: 29.4 pg (ref 25.1–34.0)
MCHC: 34.2 g/dL (ref 31.5–36.0)
MCV: 86 fL (ref 79.5–101.0)
MONO#: 0.7 10*3/uL (ref 0.1–0.9)
MONO%: 12.9 % (ref 0.0–14.0)
NEUT#: 2.4 10*3/uL (ref 1.5–6.5)
NEUT%: 47.6 % (ref 38.4–76.8)
Platelets: 212 10*3/uL (ref 145–400)
RBC: 5 10*6/uL (ref 3.70–5.45)
RDW: 13.3 % (ref 11.2–14.5)
WBC: 5 10*3/uL (ref 3.9–10.3)
lymph#: 1.7 10*3/uL (ref 0.9–3.3)

## 2013-05-14 LAB — COMPREHENSIVE METABOLIC PANEL (CC13)
ALT: 22 U/L (ref 0–55)
AST: 28 U/L (ref 5–34)
Albumin: 3.9 g/dL (ref 3.5–5.0)
Alkaline Phosphatase: 73 U/L (ref 40–150)
Anion Gap: 11 meq/L (ref 3–11)
BUN: 25.1 mg/dL (ref 7.0–26.0)
CO2: 23 meq/L (ref 22–29)
Calcium: 10.7 mg/dL — ABNORMAL HIGH (ref 8.4–10.4)
Chloride: 108 meq/L (ref 98–109)
Creatinine: 1 mg/dL (ref 0.6–1.1)
Glucose: 93 mg/dL (ref 70–140)
Potassium: 4.3 meq/L (ref 3.5–5.1)
Sodium: 142 meq/L (ref 136–145)
Total Bilirubin: 0.86 mg/dL (ref 0.20–1.20)
Total Protein: 7.7 g/dL (ref 6.4–8.3)

## 2013-05-17 ENCOUNTER — Other Ambulatory Visit: Payer: Medicare Other

## 2013-05-19 ENCOUNTER — Encounter: Payer: Self-pay | Admitting: Oncology

## 2013-05-19 ENCOUNTER — Ambulatory Visit (HOSPITAL_BASED_OUTPATIENT_CLINIC_OR_DEPARTMENT_OTHER): Payer: Medicare Other | Admitting: Oncology

## 2013-05-19 ENCOUNTER — Encounter (INDEPENDENT_AMBULATORY_CARE_PROVIDER_SITE_OTHER): Payer: Self-pay

## 2013-05-19 ENCOUNTER — Telehealth: Payer: Self-pay | Admitting: Oncology

## 2013-05-19 VITALS — BP 113/78 | HR 85 | Temp 98.7°F | Resp 18 | Ht 64.0 in | Wt 180.5 lb

## 2013-05-19 DIAGNOSIS — M899 Disorder of bone, unspecified: Secondary | ICD-10-CM

## 2013-05-19 DIAGNOSIS — Z170 Estrogen receptor positive status [ER+]: Secondary | ICD-10-CM

## 2013-05-19 DIAGNOSIS — I10 Essential (primary) hypertension: Secondary | ICD-10-CM

## 2013-05-19 DIAGNOSIS — C782 Secondary malignant neoplasm of pleura: Secondary | ICD-10-CM

## 2013-05-19 DIAGNOSIS — C50911 Malignant neoplasm of unspecified site of right female breast: Secondary | ICD-10-CM

## 2013-05-19 DIAGNOSIS — Z853 Personal history of malignant neoplasm of breast: Secondary | ICD-10-CM

## 2013-05-19 DIAGNOSIS — C50919 Malignant neoplasm of unspecified site of unspecified female breast: Secondary | ICD-10-CM

## 2013-05-19 MED ORDER — LETROZOLE 2.5 MG PO TABS: 2.5000 mg | ORAL_TABLET | Freq: Every day | ORAL | Status: DC

## 2013-05-19 NOTE — Telephone Encounter (Signed)
, °

## 2013-05-19 NOTE — Patient Instructions (Signed)
Please call Dr Darrold Span in ~ 2 weeks to let me know how your hip/ leg is doing.

## 2013-05-19 NOTE — Progress Notes (Signed)
OFFICE PROGRESS NOTE   05/19/2013   Physicians:Daniel Eloise Harman, R.[place] Callas, A.Kendall   INTERVAL HISTORY:  Patient is seen, together with mother, in continuing attention to metastatic ER PR + right breast cancer involving pleura, for which she continues Femara. She has had no known progression, tho she has now been on Femara 4.5 years and has not had repeat CT or PET in past year. CA 2729 has never normalized, but remains stable. Since last visit, she has made dietary changes and has intentionally lost over 18 lbs, feeling much better with this. On 05-01-13 she had acute onset of pain right hip while lying down, then pain right SI region radiating to RLE. She has had similar pain on occasion in past, and these symptoms have gradually improved; she has taken total of 10 naprosyn since onset. She has no other discomfort, no SOB or chest pain, and no other symptoms that seem concerning from standpoint of the metastatic breast cancer. She had left mammogram (tomo) at [facility] 03-23-13 without findings of concern, and bone density scan at [facility] 03-26-13 with stable osteopenia. She was seen at Dr Silvano Rusk office in June 2014 and is to see Dr Edgar Callas next in Jan.  ONCOLOGIC HISTORY The right breast carcinoma initially was T2 N1, ER/PR-positive in December 1998, treated with mastectomy with 12 node axillary evaluation and adjuvant Adriamycin/Cytoxan followed by 5 years of tamoxifen through December 2003. 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 She has tolerated Femara well and clinically has continued to do very well, though CA 27.29 has never normalized. Last CT CAP and PET were in June 2011 and last CT chest 12-2011. She had bone density scan at [facility] 03-26-13, with osteopenia stable. Left tomo mammogram was at [facility] 03-23-13.   Review of systems as above,  also: No GI problems. No HA or other neurologic symptoms. Allergies controlled. No recent infectious illness Remainder of 10 point Review of Systems negative.  Objective:  Vital signs in last 24 hours:  BP 113/78  Pulse 85  Temp(Src) 98.7 F (37.1 C) (Oral)  Resp 18  Ht 5\' 4"  (1.626 m)  Wt 180 lb 8 oz (81.874 kg)  BMI 30.97 kg/m2  Alert, oriented and appropriate. Ambulatory without assistance, favoring right hip.    HEENT:PERRL, sclerae not icteric. Oral mucosa moist without lesions, posterior pharynx clear. Normal hair pattern Neck supple. No JVD.  Lymphatics:no cervical,suraclavicular, axillary or inguinal adenopathy Resp: clear to auscultation to bases bilaterally and normal percussion bilaterally Cardio: regular rate and rhythm. No gallop. GI: soft, nontender, not distended, no mass or organomegaly. Normally active bowel sounds. Musculoskeletal/ Extremities: without pitting edema, cords, tenderness. Spine not tender Neuro: no peripheral neuropathy. Otherwise nonfocal. Psych as above Skin without rash including low back/ right SI or hip areas, and no ecchymosis or petechiae Breasts: right mastectomy scar without findings of concern and left breast without dominant mass, skin or nipple findings. Axillae benign.   Lab Results:  Results for orders placed in visit on 05/14/13  CBC WITH DIFFERENTIAL      Result Value Range   WBC 5.0  3.9 - 10.3 10e3/uL   NEUT# 2.4  1.5 - 6.5 10e3/uL   HGB 14.7  11.6 - 15.9 g/dL   HCT 95.2  84.1 - 32.4 %   Platelets 212  145 - 400 10e3/uL   MCV 86.0  79.5 -  101.0 fL   MCH 29.4  25.1 - 34.0 pg   MCHC 34.2  31.5 - 36.0 g/dL   RBC 1.30  8.65 - 7.84 10e6/uL   RDW 13.3  11.2 - 14.5 %   lymph# 1.7  0.9 - 3.3 10e3/uL   MONO# 0.7  0.1 - 0.9 10e3/uL   Eosinophils Absolute 0.2  0.0 - 0.5 10e3/uL   Basophils Absolute 0.1  0.0 - 0.1 10e3/uL   NEUT% 47.6  38.4 - 76.8 %   LYMPH% 33.6  14.0 - 49.7 %   MONO% 12.9  0.0 - 14.0 %   EOS% 4.5  0.0 - 7.0  %   BASO% 1.4  0.0 - 2.0 %  COMPREHENSIVE METABOLIC PANEL (CC13)      Result Value Range   Sodium 142  136 - 145 mEq/L   Potassium 4.3  3.5 - 5.1 mEq/L   Chloride 108  98 - 109 mEq/L   CO2 23  22 - 29 mEq/L   Glucose 93  70 - 140 mg/dl   BUN 69.6  7.0 - 29.5 mg/dL   Creatinine 1.0  0.6 - 1.1 mg/dL   Total Bilirubin 2.84  0.20 - 1.20 mg/dL   Alkaline Phosphatase 73  40 - 150 U/L   AST 28  5 - 34 U/L   ALT 22  0 - 55 U/L   Total Protein 7.7  6.4 - 8.3 g/dL   Albumin 3.9  3.5 - 5.0 g/dL   Calcium 13.2 (*) 8.4 - 10.4 mg/dL   Anion Gap 11  3 - 11 mEq/L  CANCER ANTIGEN 27.29      Result Value Range   CA 27.29 283 (*) 0 - 39 U/mL    CA 2729 was 294 in 12-2012 and 291 in 08-2012  Ca++ noted, has not been elevated recently, will follow   Studies/Results:  DUAL X-RAY ABSORPTIOMETRY (DXA) FOR BONE MINERAL DENSITY  03-26-2013 AP LUMBAR SPINE (L2, L3)  Bone Mineral Density (BMD): 0.910 g/cm2  Young Adult T Score: -1.3  Z Score: 0.8  LEFT FEMUR (NECK)  Bone Mineral Density (BMD): 0.734 g/cm2  Young Adult T Score: -1.0  Z Score: 0.7  ASSESSMENT: Patient's diagnostic category is LOW BONE MASS by WHO  Criteria.  FRACTURE RISK: MODERATE  FRAX: Based on the World Health Organization FRAX model, the 10  year probability of a major osteoporotic fracture is 8.3%. The 10  year probability of a hip fracture is 0.8%.  FRAX is most useful in patients with low hip BMD. The WHO  algorithm has not been validated for the use of spine BMD. Using  FRAX in patients with relatively normal BMD in the hip and low BMD  in the spine requires special clinical consideration as fracture  risk may be underestimated in these patients.  Comparison: There has been no significant change in BMD in the  spine or total left hip as compared to baseline study dated  03/08/2009 or most recent study dated 03/12/2012.   DIGITAL SCREENING UNILATERAL LEFT MAMMOGRAM WITH CAD  [facility] DIGITAL BREAST  TOMOSYNTHESIS  Digital breast tomosynthesis images are acquired in two  projections. These images are reviewed in combination with the  digital mammogram, confirming the findings below.  Comparison: Previous exam(s).  FINDINGS:  ACR Breast Density Category b: There are scattered areas of  fibroglandular density.  There are no findings suspicious for malignancy.  Images were processed with CAD.  IMPRESSION:  No mammographic evidence of  malignancy.  A result letter of this screening mammogram will be mailed directly  to the patient.  RECOMMENDATION:  Screening mammogram in one year. (Code:[id number])  BI-RADS CATEGORY 1: Negative    Medications: I have reviewed the patient's current medications. Continue Femara. Declined flu vaccine  DISCUSSION: I have asked patient to call in 2 weeks to let me know how the right hip/leg/SI area are doing. If still significant symptoms will need imaging.  Assessment/Plan:  1.1. 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 Will need imaging if RLE symptoms continue. I will see her back in ~ 3 months as long as no continued problems with RLE or other 2.osteopenia stable, on calcium, D and drinks lots of milk. Yearly bone density scans necessary on continued high risk aromatase inhibitor  3.HTN stable  4.degenerative arthritis less bothersome in knees since weight loss 5.environmental allergies on desensitization shots by Dr  Callas 6.intentional weight loss 7.declined flu vaccine      Brentyn Seehafer P, MD   05/19/2013, 1:39 PM

## 2013-05-20 ENCOUNTER — Ambulatory Visit: Payer: Medicare Other

## 2013-06-04 ENCOUNTER — Telehealth: Payer: Self-pay | Admitting: *Deleted

## 2013-06-04 NOTE — Telephone Encounter (Signed)
Lm gv appt for 08/09/13 @ 1pm.made pt aware that i will mail a letter/avs..td

## 2013-08-02 ENCOUNTER — Other Ambulatory Visit (HOSPITAL_BASED_OUTPATIENT_CLINIC_OR_DEPARTMENT_OTHER): Payer: Medicare Other

## 2013-08-02 DIAGNOSIS — C50911 Malignant neoplasm of unspecified site of right female breast: Secondary | ICD-10-CM

## 2013-08-02 DIAGNOSIS — C782 Secondary malignant neoplasm of pleura: Secondary | ICD-10-CM

## 2013-08-02 DIAGNOSIS — C50919 Malignant neoplasm of unspecified site of unspecified female breast: Secondary | ICD-10-CM

## 2013-08-02 LAB — CBC WITH DIFFERENTIAL/PLATELET
BASO%: 1.2 % (ref 0.0–2.0)
Basophils Absolute: 0.1 10*3/uL (ref 0.0–0.1)
EOS%: 5.2 % (ref 0.0–7.0)
Eosinophils Absolute: 0.3 10*3/uL (ref 0.0–0.5)
HCT: 40.9 % (ref 34.8–46.6)
HGB: 14 g/dL (ref 11.6–15.9)
LYMPH%: 35.4 % (ref 14.0–49.7)
MCH: 30.2 pg (ref 25.1–34.0)
MCHC: 34.2 g/dL (ref 31.5–36.0)
MCV: 88.3 fL (ref 79.5–101.0)
MONO#: 0.6 10*3/uL (ref 0.1–0.9)
MONO%: 11.9 % (ref 0.0–14.0)
NEUT%: 46.3 % (ref 38.4–76.8)
NEUTROS ABS: 2.3 10*3/uL (ref 1.5–6.5)
PLATELETS: 211 10*3/uL (ref 145–400)
RBC: 4.63 10*6/uL (ref 3.70–5.45)
RDW: 13 % (ref 11.2–14.5)
WBC: 4.9 10*3/uL (ref 3.9–10.3)
lymph#: 1.7 10*3/uL (ref 0.9–3.3)

## 2013-08-02 LAB — COMPREHENSIVE METABOLIC PANEL (CC13)
ALBUMIN: 3.9 g/dL (ref 3.5–5.0)
ALT: 22 U/L (ref 0–55)
AST: 23 U/L (ref 5–34)
Alkaline Phosphatase: 66 U/L (ref 40–150)
Anion Gap: 11 mEq/L (ref 3–11)
BILIRUBIN TOTAL: 0.72 mg/dL (ref 0.20–1.20)
BUN: 23.3 mg/dL (ref 7.0–26.0)
CO2: 25 mEq/L (ref 22–29)
Calcium: 10.4 mg/dL (ref 8.4–10.4)
Chloride: 105 mEq/L (ref 98–109)
Creatinine: 0.9 mg/dL (ref 0.6–1.1)
Glucose: 90 mg/dl (ref 70–140)
POTASSIUM: 4.1 meq/L (ref 3.5–5.1)
Sodium: 141 mEq/L (ref 136–145)
TOTAL PROTEIN: 7.1 g/dL (ref 6.4–8.3)

## 2013-08-08 ENCOUNTER — Other Ambulatory Visit: Payer: Self-pay | Admitting: Oncology

## 2013-08-09 ENCOUNTER — Ambulatory Visit (HOSPITAL_BASED_OUTPATIENT_CLINIC_OR_DEPARTMENT_OTHER): Payer: Medicare Other | Admitting: Oncology

## 2013-08-09 ENCOUNTER — Encounter: Payer: Self-pay | Admitting: Oncology

## 2013-08-09 ENCOUNTER — Encounter (INDEPENDENT_AMBULATORY_CARE_PROVIDER_SITE_OTHER): Payer: Self-pay

## 2013-08-09 ENCOUNTER — Ambulatory Visit (HOSPITAL_BASED_OUTPATIENT_CLINIC_OR_DEPARTMENT_OTHER): Payer: Medicare Other

## 2013-08-09 ENCOUNTER — Telehealth: Payer: Self-pay | Admitting: Oncology

## 2013-08-09 VITALS — BP 168/99 | HR 86 | Temp 98.6°F | Resp 18 | Ht 64.0 in | Wt 181.2 lb

## 2013-08-09 DIAGNOSIS — C782 Secondary malignant neoplasm of pleura: Secondary | ICD-10-CM

## 2013-08-09 DIAGNOSIS — Z170 Estrogen receptor positive status [ER+]: Secondary | ICD-10-CM

## 2013-08-09 DIAGNOSIS — M899 Disorder of bone, unspecified: Secondary | ICD-10-CM

## 2013-08-09 DIAGNOSIS — M949 Disorder of cartilage, unspecified: Secondary | ICD-10-CM

## 2013-08-09 DIAGNOSIS — C50919 Malignant neoplasm of unspecified site of unspecified female breast: Secondary | ICD-10-CM

## 2013-08-09 LAB — CANCER ANTIGEN 27.29: CA 27.29: 276 U/mL — ABNORMAL HIGH (ref 0–39)

## 2013-08-09 NOTE — Progress Notes (Signed)
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 New right hip pain in October promptly resolved without other intervention; she has had no new or different pain in interim. She continues diet 5 days weekly, weight initially down 18 lbs with the dietary change and now stable. She is feeling generally well other than chronic environmental allergies, does have mild HA today. She does not have PAC.  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 Recurrent to pleura 2010.     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   01/18/2013 Initial Diagnosis 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  The right breast carcinoma initially was T2 N1, ER/PR-positive in December 1998, treated with mastectomy with 12 node axillary evaluation and adjuvant Adriamycin/Cytoxan followed by 5 years of tamoxifen through December 2003. 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 CA 2729 was 434 in April 2010.  She has tolerated Femara well and clinically has continued to do very well, though CA 27.29 has never normalized. Last CT CAP and PET were in June 2011 and last CT chest 12-2011. She had bone density scan at [facility] 03-26-13, with osteopenia stable. Left tomo mammogram was at [name] 03-23-13.   Review of systems as above, also: No fever or other symptoms of infection. No SOB, cough, chest pain. Arthritis symptoms knees stable. Remainder of 10 point Review  of Systems negative.  Objective:  Vital signs in last 24 hours:  BP 168/99  Pulse 86  Temp(Src) 98.6 F (37 C) (Oral)  Resp 18  Ht 5\' 4"  (1.626 m)  Wt 181 lb 3.2 oz (82.192 kg)  BMI 31.09 kg/m2  Alert, oriented and appropriate. Ambulatory without difficulty.    HEENT:PERRL, sclerae not icteric. Oral mucosa moist without lesions, posterior pharynx clear.  Neck supple. No JVD.  Lymphatics:no cervical,suraclavicular, axillary adenopathy Resp: clear to auscultation bilaterally with breath sounds just minimally diminished in right base as has been case, and normal percussion bilaterally Cardio: regular rate and rhythm. No gallop. GI: soft, nontender, not distended, no mass or organomegaly. Normally active bowel sounds. Surgical incision not remarkable. Musculoskeletal/ Extremities: without pitting edema, cords, tenderness Neuro: no peripheral neuropathy. Speech fluent, CN intact. Otherwise nonfocal. Psych appropriate mood and affect. Skin without rash, ecchymosis, petechiae Breasts: Left without dominant mass, skin or nipple findings. Right mastectomy scar not remarkable. Axillae benign.   Lab Results:  Results for orders placed in visit on 08/02/13  CBC WITH DIFFERENTIAL      Result Value Range   WBC 4.9  3.9 - 10.3 10e3/uL   NEUT# 2.3  1.5 - 6.5 10e3/uL   HGB 14.0  11.6 - 15.9 g/dL   HCT 40.9  34.8 - 46.6 %   Platelets 211  145 - 400 10e3/uL   MCV 88.3  79.5 - 101.0 fL   MCH 30.2  25.1 - 34.0 pg   MCHC 34.2  31.5 - 36.0 g/dL   RBC 4.63  3.70 - 5.45 10e6/uL   RDW  13.0  11.2 - 14.5 %   lymph# 1.7  0.9 - 3.3 10e3/uL   MONO# 0.6  0.1 - 0.9 10e3/uL   Eosinophils Absolute 0.3  0.0 - 0.5 10e3/uL   Basophils Absolute 0.1  0.0 - 0.1 10e3/uL   NEUT% 46.3  38.4 - 76.8 %   LYMPH% 35.4  14.0 - 49.7 %   MONO% 11.9  0.0 - 14.0 %   EOS% 5.2  0.0 - 7.0 %   BASO% 1.2  0.0 - 2.0 %  COMPREHENSIVE METABOLIC PANEL (SW96)      Result Value Range   Sodium 141  136 - 145 mEq/L    Potassium 4.1  3.5 - 5.1 mEq/L   Chloride 105  98 - 109 mEq/L   CO2 25  22 - 29 mEq/L   Glucose 90  70 - 140 mg/dl   BUN 23.3  7.0 - 26.0 mg/dL   Creatinine 0.9  0.6 - 1.1 mg/dL   Total Bilirubin 0.72  0.20 - 1.20 mg/dL   Alkaline Phosphatase 66  40 - 150 U/L   AST 23  5 - 34 U/L   ALT 22  0 - 55 U/L   Total Protein 7.1  6.4 - 8.3 g/dL   Albumin 3.9  3.5 - 5.0 g/dL   Calcium 10.4  8.4 - 10.4 mg/dL   Anion Gap 11  3 - 11 mEq/L   labs above reviewed with patient at time of visit, and will be sent to PCP with this note. Lipid studies were last done at this office June 2014  CA 2729 not drawn with other labs on 08-02-13, so drawn after visit today and subsequently resulted 276, this having been 283 in October 2014, 294 in June 2014, 291 in Feb 2014. Marker was 434 in April 2010.  Studies/Results:  No results found.  Medications: I have reviewed the patient's current medications. We have discussed use of Tamiflu if influenza symptoms.  DISCUSSION: Even tho patient is clinically stable, she will have been on Femara for 5 years this spring, and I would like to restage with scans. Patient is now in agreement with this recommendation and will have CT CAP shortly prior to my next visit.  Assessment/Plan:  1.1. Metastatic hormone positive breast cancer to pleura: post VATS pleurodesis and still extended response on Femara after very long disease free interval from initial breast cancer treatment. We will let her know results of ca2729 from today and will repeat CT CAP in follow up at ~ 5 years on Femara upcoming. 2.osteopenia stable, on calcium, D and drinks lots of milk. Yearly bone density scans necessary on continued high risk aromatase inhibitor  3.HTN stable  4.degenerative arthritis less bothersome in knees since weight loss. Right hip/ right SI pain resolved shortly after visit Oct 2014.  5.environmental allergies on desensitization shots by Dr Donneta Romberg.  6.intentional weight loss:  stable 7.declined flu vaccine: tamiflu if influenza symptoms 8.last fasting lipids at this office were done in June 2014, followed with the Femara. She should have these again at least summer 2015 if still on aromatase inhibitor, otherwise per Dr Philip Aspen. 9.past tobacco, quit 1998  Patient and mother have had questions and concerns addressed to their satisfaction now. She will call if needed prior to next scheduled visit. Time spent 25 min including >50% counseling and coordination of care   LIVESAY,LENNIS P, MD   08/09/2013, 8:46 PM

## 2013-08-09 NOTE — Patient Instructions (Signed)
We'll let you know results of the marker -- sorry you had to go back today!

## 2013-08-10 ENCOUNTER — Telehealth: Payer: Self-pay

## 2013-08-10 NOTE — Telephone Encounter (Signed)
Spoke with Ms. Lisa [name]  and told her the results of the Ca-125 as noted below by Dr. Marko Plume.  Pt. Pleased and fine with having scans as planned.

## 2013-08-10 NOTE — Telephone Encounter (Signed)
Message copied by Baruch Merl on Tue Aug 10, 2013  2:20 PM ------      Message from: Gordy Levan      Created: Tue Aug 10, 2013  8:37 AM       Labs seen and need follow up: please let her know marker 276, which she will be pleased to hear. I would still like to get the CT scans as planned, but I am pleased that the marker is stable. ------

## 2013-08-24 ENCOUNTER — Telehealth: Payer: Self-pay | Admitting: *Deleted

## 2013-08-24 NOTE — Telephone Encounter (Signed)
Pt came by to cancel her appt for her ct...td

## 2013-08-30 ENCOUNTER — Encounter: Payer: Self-pay | Admitting: Oncology

## 2013-08-30 NOTE — Progress Notes (Unsigned)
Medical Oncology  Note received from patient that she is concerned about radiation exposure and declines CT that had been scheduled for 11-05-13; enclosed [employer] article from March 2015. She mentions that she would consider MRI or would "just take my chances with the cancer"  L.Ashtynn Berke, MD

## 2013-11-04 ENCOUNTER — Other Ambulatory Visit: Payer: Self-pay

## 2013-11-04 DIAGNOSIS — C782 Secondary malignant neoplasm of pleura: Principal | ICD-10-CM

## 2013-11-04 DIAGNOSIS — C50919 Malignant neoplasm of unspecified site of unspecified female breast: Secondary | ICD-10-CM

## 2013-11-05 ENCOUNTER — Ambulatory Visit (HOSPITAL_COMMUNITY): Payer: Medicare Other

## 2013-11-05 ENCOUNTER — Other Ambulatory Visit: Payer: Self-pay | Admitting: Oncology

## 2013-11-05 ENCOUNTER — Other Ambulatory Visit (HOSPITAL_BASED_OUTPATIENT_CLINIC_OR_DEPARTMENT_OTHER): Payer: Medicare Other

## 2013-11-05 DIAGNOSIS — C50919 Malignant neoplasm of unspecified site of unspecified female breast: Secondary | ICD-10-CM

## 2013-11-05 DIAGNOSIS — C782 Secondary malignant neoplasm of pleura: Secondary | ICD-10-CM

## 2013-11-05 LAB — COMPREHENSIVE METABOLIC PANEL (CC13)
ALT: 17 U/L (ref 0–55)
ANION GAP: 12 meq/L — AB (ref 3–11)
AST: 24 U/L (ref 5–34)
Albumin: 4 g/dL (ref 3.5–5.0)
Alkaline Phosphatase: 67 U/L (ref 40–150)
BUN: 20.1 mg/dL (ref 7.0–26.0)
CALCIUM: 10 mg/dL (ref 8.4–10.4)
CO2: 21 meq/L — AB (ref 22–29)
CREATININE: 0.9 mg/dL (ref 0.6–1.1)
Chloride: 109 mEq/L (ref 98–109)
Glucose: 121 mg/dl (ref 70–140)
Potassium: 3.8 mEq/L (ref 3.5–5.1)
Sodium: 142 mEq/L (ref 136–145)
Total Bilirubin: 0.99 mg/dL (ref 0.20–1.20)
Total Protein: 7.4 g/dL (ref 6.4–8.3)

## 2013-11-05 LAB — CBC WITH DIFFERENTIAL/PLATELET
BASO%: 1.1 % (ref 0.0–2.0)
BASOS ABS: 0 10*3/uL (ref 0.0–0.1)
EOS%: 5.5 % (ref 0.0–7.0)
Eosinophils Absolute: 0.2 10*3/uL (ref 0.0–0.5)
HCT: 41.7 % (ref 34.8–46.6)
HEMOGLOBIN: 14.3 g/dL (ref 11.6–15.9)
LYMPH#: 1.5 10*3/uL (ref 0.9–3.3)
LYMPH%: 36.2 % (ref 14.0–49.7)
MCH: 30 pg (ref 25.1–34.0)
MCHC: 34.3 g/dL (ref 31.5–36.0)
MCV: 87.4 fL (ref 79.5–101.0)
MONO#: 0.4 10*3/uL (ref 0.1–0.9)
MONO%: 10.7 % (ref 0.0–14.0)
NEUT%: 46.5 % (ref 38.4–76.8)
NEUTROS ABS: 1.9 10*3/uL (ref 1.5–6.5)
Platelets: 200 10*3/uL (ref 145–400)
RBC: 4.77 10*6/uL (ref 3.70–5.45)
RDW: 13.3 % (ref 11.2–14.5)
WBC: 4.2 10*3/uL (ref 3.9–10.3)

## 2013-11-06 LAB — CANCER ANTIGEN 27.29: CA 27.29: 259 U/mL — AB (ref 0–39)

## 2013-11-08 ENCOUNTER — Ambulatory Visit (HOSPITAL_BASED_OUTPATIENT_CLINIC_OR_DEPARTMENT_OTHER): Payer: Medicare Other | Admitting: Oncology

## 2013-11-08 ENCOUNTER — Encounter: Payer: Self-pay | Admitting: Oncology

## 2013-11-08 ENCOUNTER — Telehealth: Payer: Self-pay | Admitting: Oncology

## 2013-11-08 VITALS — BP 154/80 | HR 92 | Temp 98.8°F | Resp 18 | Ht 64.0 in | Wt 182.1 lb

## 2013-11-08 DIAGNOSIS — I10 Essential (primary) hypertension: Secondary | ICD-10-CM

## 2013-11-08 DIAGNOSIS — M899 Disorder of bone, unspecified: Secondary | ICD-10-CM

## 2013-11-08 DIAGNOSIS — Z79899 Other long term (current) drug therapy: Secondary | ICD-10-CM

## 2013-11-08 DIAGNOSIS — C782 Secondary malignant neoplasm of pleura: Secondary | ICD-10-CM

## 2013-11-08 DIAGNOSIS — C50919 Malignant neoplasm of unspecified site of unspecified female breast: Secondary | ICD-10-CM

## 2013-11-08 DIAGNOSIS — M949 Disorder of cartilage, unspecified: Secondary | ICD-10-CM

## 2013-11-08 DIAGNOSIS — R634 Abnormal weight loss: Secondary | ICD-10-CM

## 2013-11-08 NOTE — Progress Notes (Signed)
OFFICE PROGRESS NOTE   11/08/2013   Physicians:Daniel Philip Aspen, R.Donneta Romberg, (A.Kendall)   INTERVAL HISTORY:  Patient is seen, together with mother, in continuing attention to metastatic breast cancer to pleura, for which she continues Femara, this begun April 2010. CA 2729 marker has remained elevated, however clinically she has done well. Patient is reluctant to have repeat scans due to her concern about radiation exposure, preferring to cancel scans that we had originally planned prior to this visit.  Last bone density scan was at [facility] 03-26-2013, with osteopenia. She does agree to have these scans yearly, as is medically appropriate on the high risk aromatase inhibitor. She had left tomo mammogram at [facility] 03-23-2013.  Patient has felt generally well since I saw her last, with no concerns that seem related to the metastatic breast cancer or ongoing aromatase inhibitor.   She does not have PAC.  She does not have other physician visits scheduled in near future. She has routine dental exam tomorrow.  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 Recurrent to pleura 2010.     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   01/18/2013 Initial Diagnosis 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  The right breast carcinoma initially was T2 N1, ER/PR-positive in December 1998, treated with mastectomy with 12 node axillary evaluation and adjuvant Adriamycin/Cytoxan followed by 5 years of tamoxifen through December 2003. 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 CA 2729 was 434 in April 2010. She has tolerated Femara well and clinically has continued to do very well, though CA 27.29 has never normalized. Last CT CAP  and PET were in June 2011 and last CT chest 12-2011. She had bone density scan at [facility] 03-26-13, with osteopenia stable. Left tomo mammogram was at [facility] 03-23-13.   Review of systems as above, also: No SOB, cough, chest pain. Environmental allergies are adequately controlled at present. No new or different pain. Good appetite, energy at baseline. No LE swelling. No GI symptoms. Remainder of 10 point Review of Systems negative.  Objective:  Vital signs in last 24 hours:  BP 154/80  Pulse 92  Temp(Src) 98.8 F (37.1 C) (Oral)  Resp 18  Ht 5\' 4"  (1.626 m)  Wt 182 lb 1.6 oz (82.6 kg)  BMI 31.24 kg/m2 weight is up 1 lb.  Alert, oriented and appropriate. Ambulatory without assistance.    HEENT:PERRL, sclerae not icteric. Oral mucosa moist without lesions, posterior pharynx clear.  Neck supple. No JVD.  Lymphatics:no cervical,suraclavicular, axillary adenopathy Resp: clear to auscultation bilaterally and normal percussion bilaterally Cardio: regular rate and rhythm. No gallop. GI: soft, nontender, not distended, no mass or organomegaly. Normally active bowel sounds. Surgical incision not remarkable. Musculoskeletal/ Extremities: without pitting edema, cords, tenderness Neuro: no peripheral neuropathy. Otherwise nonfocal Skin without rash, ecchymosis, petechiae Breasts: right mastectomy scar without evidence of local recurrence. Point tender in muscles at posterior axilla, without other findings there. Left breast without dominant mass, skin or nipple findings. Axillae benign.   Lab Results:  Results for orders placed in visit on 11/05/13  COMPREHENSIVE METABOLIC PANEL (BT51)      Result Value Ref Range   Sodium 142  136 - 145 mEq/L   Potassium 3.8  3.5 - 5.1 mEq/L  Chloride 109  98 - 109 mEq/L   CO2 21 (*) 22 - 29 mEq/L   Glucose 121  70 - 140 mg/dl   BUN 20.1  7.0 - 26.0 mg/dL   Creatinine 0.9  0.6 - 1.1 mg/dL   Total Bilirubin 0.99  0.20 - 1.20 mg/dL    Alkaline Phosphatase 67  40 - 150 U/L   AST 24  5 - 34 U/L   ALT 17  0 - 55 U/L   Total Protein 7.4  6.4 - 8.3 g/dL   Albumin 4.0  3.5 - 5.0 g/dL   Calcium 10.0  8.4 - 10.4 mg/dL   Anion Gap 12 (*) 3 - 11 mEq/L  CANCER ANTIGEN 27.29      Result Value Ref Range   CA 27.29 259 (*) 0 - 39 U/mL  CBC WITH DIFFERENTIAL      Result Value Ref Range   WBC 4.2  3.9 - 10.3 10e3/uL   NEUT# 1.9  1.5 - 6.5 10e3/uL   HGB 14.3  11.6 - 15.9 g/dL   HCT 41.7  34.8 - 46.6 %   Platelets 200  145 - 400 10e3/uL   MCV 87.4  79.5 - 101.0 fL   MCH 30.0  25.1 - 34.0 pg   MCHC 34.3  31.5 - 36.0 g/dL   RBC 4.77  3.70 - 5.45 10e6/uL   RDW 13.3  11.2 - 14.5 %   lymph# 1.5  0.9 - 3.3 10e3/uL   MONO# 0.4  0.1 - 0.9 10e3/uL   Eosinophils Absolute 0.2  0.0 - 0.5 10e3/uL   Basophils Absolute 0.0  0.0 - 0.1 10e3/uL   NEUT% 46.5  38.4 - 76.8 %   LYMPH% 36.2  14.0 - 49.7 %   MONO% 10.7  0.0 - 14.0 %   EOS% 5.5  0.0 - 7.0 %   BASO% 1.1  0.0 - 2.0 %   Marker is essentially stable, last 276 in Jan 2015 and 294 in June 2014. This was 434 at presentation with metastatic disease in April 2010.  Studies/Results:  No results found.  Medications: I have reviewed the patient's current medications.  DISCUSSION: We have again discussed fact that scans done for known cancer can be helpful in identifying progression on hormonal blocker early, thus can allow change in therapy prior to symptoms; I explained that this is a different risk/benefit situation than overuse of imaging without known disease. We have discussed fact that different hormonal interventions would likely be useful in her disease, given the long response to Femara. She still prefers not to have scans now.  Assessment/Plan: 1.Metastatic hormone positive breast cancer to pleura: post VATS pleurodesis and still extended response on Femara after very long disease free interval from initial breast cancer treatment. We will let her know results of ca2729 as above. I  will see her back in ~ 4 months with labs. She will need left mammogram and bone density scan in Sept. 2.osteopenia stable, on calcium, D and drinks lots of milk. Yearly bone density scans necessary on continued high risk aromatase inhibitor  3.HTN stable  4.degenerative arthritis less bothersome in knees since weight loss. Right hip/ right SI pain resolved  5.environmental allergies on desensitization shots by Dr Donneta Romberg.  6.intentional weight loss: stable  7.declined flu vaccine: tamiflu if influenza symptoms  8.last fasting lipids at this office were done in June 2014, followed with the Femara. She should have these again at least summer 2015 if still on  aromatase inhibitor, otherwise per Dr Philip Aspen. Ordered with next labs. 9.past tobacco, quit 1998       Gordy Levan, MD   11/08/2013, 10:42 AM

## 2013-11-08 NOTE — Telephone Encounter (Signed)
gv relative appt schedule for aug. LL schedule for aug not available yet. relative aware she will be contacted re f/u. reminders to MD/AM..

## 2013-11-11 ENCOUNTER — Other Ambulatory Visit: Payer: Self-pay | Admitting: Oncology

## 2013-11-11 ENCOUNTER — Other Ambulatory Visit: Payer: Self-pay

## 2013-11-11 DIAGNOSIS — Z1231 Encounter for screening mammogram for malignant neoplasm of breast: Secondary | ICD-10-CM

## 2013-11-11 DIAGNOSIS — C782 Secondary malignant neoplasm of pleura: Principal | ICD-10-CM

## 2013-11-11 DIAGNOSIS — C50919 Malignant neoplasm of unspecified site of unspecified female breast: Secondary | ICD-10-CM

## 2013-11-11 NOTE — Progress Notes (Signed)
Told Lisa [name] that the orders are in Epic for her mammogran and bone density for this September.  She can call the [facility] and set up appointments at her convince.  Ms. Back verbalized understanding.

## 2013-11-12 ENCOUNTER — Other Ambulatory Visit: Payer: Self-pay | Admitting: Oncology

## 2013-11-12 DIAGNOSIS — Z9011 Acquired absence of right breast and nipple: Secondary | ICD-10-CM

## 2013-11-12 DIAGNOSIS — Z1231 Encounter for screening mammogram for malignant neoplasm of breast: Secondary | ICD-10-CM

## 2013-11-15 ENCOUNTER — Other Ambulatory Visit: Payer: Self-pay | Admitting: Oncology

## 2014-01-26 ENCOUNTER — Telehealth: Payer: Self-pay | Admitting: Oncology

## 2014-01-26 NOTE — Telephone Encounter (Signed)
pt called to sched appt with Dr. Rachel Moulds pt aware of new d.t

## 2014-03-06 ENCOUNTER — Other Ambulatory Visit: Payer: Self-pay | Admitting: Oncology

## 2014-03-07 ENCOUNTER — Other Ambulatory Visit (HOSPITAL_BASED_OUTPATIENT_CLINIC_OR_DEPARTMENT_OTHER): Payer: Medicare Other

## 2014-03-07 DIAGNOSIS — C50919 Malignant neoplasm of unspecified site of unspecified female breast: Secondary | ICD-10-CM

## 2014-03-07 DIAGNOSIS — C782 Secondary malignant neoplasm of pleura: Principal | ICD-10-CM

## 2014-03-07 DIAGNOSIS — Z79899 Other long term (current) drug therapy: Secondary | ICD-10-CM

## 2014-03-07 LAB — CBC WITH DIFFERENTIAL/PLATELET
BASO%: 1.3 % (ref 0.0–2.0)
BASOS ABS: 0.1 10*3/uL (ref 0.0–0.1)
EOS%: 5.8 % (ref 0.0–7.0)
Eosinophils Absolute: 0.2 10*3/uL (ref 0.0–0.5)
HCT: 42.8 % (ref 34.8–46.6)
HEMOGLOBIN: 14.4 g/dL (ref 11.6–15.9)
LYMPH%: 33.5 % (ref 14.0–49.7)
MCH: 29.6 pg (ref 25.1–34.0)
MCHC: 33.5 g/dL (ref 31.5–36.0)
MCV: 88.3 fL (ref 79.5–101.0)
MONO#: 0.5 10*3/uL (ref 0.1–0.9)
MONO%: 13 % (ref 0.0–14.0)
NEUT#: 1.8 10*3/uL (ref 1.5–6.5)
NEUT%: 46.4 % (ref 38.4–76.8)
PLATELETS: 219 10*3/uL (ref 145–400)
RBC: 4.85 10*6/uL (ref 3.70–5.45)
RDW: 13.1 % (ref 11.2–14.5)
WBC: 3.9 10*3/uL (ref 3.9–10.3)
lymph#: 1.3 10*3/uL (ref 0.9–3.3)

## 2014-03-07 LAB — LIPID PANEL
CHOL/HDL RATIO: 2.9 ratio
Cholesterol: 152 mg/dL (ref 0–200)
HDL: 53 mg/dL (ref >39)
LDL Cholesterol: 69 mg/dL (ref 0–99)
TRIGLYCERIDES: 152 mg/dL — AB (ref <150)
VLDL: 30 mg/dL (ref 0–40)

## 2014-03-07 LAB — COMPREHENSIVE METABOLIC PANEL (CC13)
ALBUMIN: 4 g/dL (ref 3.5–5.0)
ALK PHOS: 67 U/L (ref 40–150)
ALT: 23 U/L (ref 0–55)
AST: 26 U/L (ref 5–34)
Anion Gap: 9 mEq/L (ref 3–11)
BILIRUBIN TOTAL: 0.91 mg/dL (ref 0.20–1.20)
BUN: 21.5 mg/dL (ref 7.0–26.0)
CO2: 25 mEq/L (ref 22–29)
CREATININE: 0.9 mg/dL (ref 0.6–1.1)
Calcium: 9.9 mg/dL (ref 8.4–10.4)
Chloride: 107 mEq/L (ref 98–109)
Glucose: 98 mg/dl (ref 70–140)
POTASSIUM: 4.1 meq/L (ref 3.5–5.1)
Sodium: 141 mEq/L (ref 136–145)
Total Protein: 7.3 g/dL (ref 6.4–8.3)

## 2014-03-07 LAB — CANCER ANTIGEN 27.29: CA 27.29: 257 U/mL — AB (ref 0–39)

## 2014-03-08 ENCOUNTER — Other Ambulatory Visit: Payer: Self-pay | Admitting: Oncology

## 2014-03-09 ENCOUNTER — Telehealth: Payer: Self-pay | Admitting: Oncology

## 2014-03-09 ENCOUNTER — Encounter: Payer: Self-pay | Admitting: Oncology

## 2014-03-09 ENCOUNTER — Ambulatory Visit (HOSPITAL_BASED_OUTPATIENT_CLINIC_OR_DEPARTMENT_OTHER): Payer: Medicare Other | Admitting: Oncology

## 2014-03-09 VITALS — BP 152/69 | HR 78 | Temp 98.6°F | Resp 18 | Ht 64.0 in | Wt 183.5 lb

## 2014-03-09 DIAGNOSIS — M899 Disorder of bone, unspecified: Secondary | ICD-10-CM

## 2014-03-09 DIAGNOSIS — M949 Disorder of cartilage, unspecified: Secondary | ICD-10-CM

## 2014-03-09 DIAGNOSIS — C50911 Malignant neoplasm of unspecified site of right female breast: Secondary | ICD-10-CM

## 2014-03-09 DIAGNOSIS — C782 Secondary malignant neoplasm of pleura: Secondary | ICD-10-CM

## 2014-03-09 DIAGNOSIS — Z87891 Personal history of nicotine dependence: Secondary | ICD-10-CM

## 2014-03-09 DIAGNOSIS — Z853 Personal history of malignant neoplasm of breast: Secondary | ICD-10-CM

## 2014-03-09 DIAGNOSIS — C50919 Malignant neoplasm of unspecified site of unspecified female breast: Secondary | ICD-10-CM

## 2014-03-09 MED ORDER — LETROZOLE 2.5 MG PO TABS: 2.5000 mg | ORAL_TABLET | Freq: Every day | ORAL | Status: DC

## 2014-03-09 NOTE — Progress Notes (Signed)
OFFICE PROGRESS NOTE   03/09/2014   Physicians:Daniel Philip Aspen, R.[name]   INTERVAL HISTORY:   Patient is seen, together with mother, in scheduled follow up of right breast cancer metastatic to pleura, for which she continues Femara, this begun April 2010. Clinically she has been very stable, however she has refused follow up scans until now, with last CT chest 12-2011 and last CT AP 12-2009. She will have DEXA and left mammogram at [facility] 03-21-14.  Patient denies any new or different problems. She has bone and joint pain with changes in barometric pressure, resolve between the weather shifts. She denies increased SOB or any chest discomfort.   She does not have PAC  ONCOLOGIC HISTORY Oncology History   Right breast carcinoma T2N1 ER/PR positive at right mastectomy with node evaluation Dec 1998, treated with adjuvant adriamycin cytoxan followed by 5 years of tamoxifen. Recurrent to pleura 2010.     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   01/18/2013 Initial Diagnosis 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  The right breast carcinoma initially was T2 N1, ER/PR-positive in December 1998, treated with mastectomy with 12 node axillary evaluation and adjuvant Adriamycin/Cytoxan followed by 5 years of tamoxifen through December 2003. 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 CA 2729 was 434 in April 2010. She has tolerated Femara well and clinically has continued to do very well, though CA 27.29 has never normalized. Last CT CAP and PET were in June 2011 and last CT chest 12-2011. She had bone density scan at [facility] 03-26-13, with osteopenia stable. Left tomo mammogram was at [facility] 03-23-13.    Review of systems as above, also: No recent infectious illness. No bleeding. Bowels ok. Appetite at baseline. No cough. No LE swelling. Remainder of 10  point Review of Systems negative.  Objective:  Vital signs in last 24 hours:  BP 152/69  Pulse 78  Temp(Src) 98.6 F (37 C) (Oral)  Resp 18  Ht 5\' 4"  (1.626 m)  Wt 183 lb 8 oz (83.235 kg)  BMI 31.48 kg/m2 weight up 1 lb  Alert, oriented and appropriate. Ambulatory without assistance difficulty.  No alopecia  HEENT:PERRL, sclerae not icteric. Oral mucosa moist without lesions, posterior pharynx clear.  Neck supple. No JVD.  Lymphatics:no cervical,suraclavicular, axillary adenopathy Resp: clear to auscultation bilaterally and normal percussion bilaterally Cardio: regular rate and rhythm. No gallop. GI: soft, nontender, not distended, no mass or organomegaly. Normally active bowel sounds.  Musculoskeletal/ Extremities: without pitting edema, cords, tenderness Neuro: no peripheral neuropathy. Otherwise nonfocal Skin without rash, ecchymosis, petechiae Breasts: Left without dominant mass, skin or nipple findings. Right mastectomy scar without evidence of local recurrence. Axillae benign.   Lab Results:  Results for orders placed in visit on 03/07/14  CBC WITH DIFFERENTIAL      Result Value Ref Range   WBC 3.9  3.9 - 10.3 10e3/uL   NEUT# 1.8  1.5 - 6.5 10e3/uL   HGB 14.4  11.6 - 15.9 g/dL   HCT 42.8  34.8 - 46.6 %   Platelets 219  145 - 400 10e3/uL   MCV 88.3  79.5 - 101.0 fL   MCH 29.6  25.1 - 34.0 pg   MCHC 33.5  31.5 - 36.0 g/dL   RBC 4.85  3.70 - 5.45 10e6/uL   RDW 13.1  11.2 - 14.5 %   lymph# 1.3  0.9 -  3.3 10e3/uL   MONO# 0.5  0.1 - 0.9 10e3/uL   Eosinophils Absolute 0.2  0.0 - 0.5 10e3/uL   Basophils Absolute 0.1  0.0 - 0.1 10e3/uL   NEUT% 46.4  38.4 - 76.8 %   LYMPH% 33.5  14.0 - 49.7 %   MONO% 13.0  0.0 - 14.0 %   EOS% 5.8  0.0 - 7.0 %   BASO% 1.3  0.0 - 2.0 %  COMPREHENSIVE METABOLIC PANEL (HX50)      Result Value Ref Range   Sodium 141  136 - 145 mEq/L   Potassium 4.1  3.5 - 5.1 mEq/L   Chloride 107  98 - 109 mEq/L   CO2 25  22 - 29 mEq/L   Glucose 98   70 - 140 mg/dl   BUN 21.5  7.0 - 26.0 mg/dL   Creatinine 0.9  0.6 - 1.1 mg/dL   Total Bilirubin 0.91  0.20 - 1.20 mg/dL   Alkaline Phosphatase 67  40 - 150 U/L   AST 26  5 - 34 U/L   ALT 23  0 - 55 U/L   Total Protein 7.3  6.4 - 8.3 g/dL   Albumin 4.0  3.5 - 5.0 g/dL   Calcium 9.9  8.4 - 10.4 mg/dL   Anion Gap 9  3 - 11 mEq/L  CANCER ANTIGEN 27.29      Result Value Ref Range   CA 27.29 257 (*) 0 - 39 U/mL  LIPID PANEL      Result Value Ref Range   Cholesterol 152  0 - 200 mg/dL   Triglycerides 152 (*) <150 mg/dL   HDL 53  >39 mg/dL   Total CHOL/HDL Ratio 2.9     VLDL 30  0 - 40 mg/dL   LDL Cholesterol 69  0 - 99 mg/dL   CA 2729 had been 259 in April, 276 in Jan, 294 in June 2014.  Studies/Results:  No results found.  Medications: I have reviewed the patient's current medications.  DISCUSSION: Patient tells me that she is now agreeable to restaging scans prior to next visit, in addition to the DEXA (and left mammogram) upcoming.   Assessment/Plan: 1.1. Metastatic hormone positive breast cancer to pleura: post VATS pleurodesis and clinically still extended response on Femara after very long disease free interval from initial breast cancer treatment. Will restage with  CT CAP, coordinating scan so it is just prior to my next visit. 2.osteopenia stable, on calcium, D and drinks lots of milk. Yearly bone density scans necessary on continued high risk aromatase inhibitor  3.HTN stable  4.degenerative arthritis less bothersome in knees since weight loss. It is possible that some of the joint symptoms are related to AI, tho not new overall 5.environmental allergies on desensitization shots by Dr Donneta Romberg.  6.intentional weight loss: stable  7.declined flu vaccine previously 8. fasting lipids as above. Will send this information to PCP also 9.past tobacco, quit 1998   Patient is in agreement with plans above and knows to call prior to next scheduled visit if  needed.     Santiaga Butzin P, MD   03/09/2014, 9:57 AM

## 2014-03-09 NOTE — Telephone Encounter (Signed)
per pof to sch pt appt-gave pt copy of sch-adv pt that CS will call to sch CT-gave pt contrast

## 2014-03-21 ENCOUNTER — Ambulatory Visit
Admission: RE | Admit: 2014-03-21 | Discharge: 2014-03-21 | Disposition: A | Discharge status: Home / Self Care | Payer: 59 | Source: Ambulatory Visit | Attending: Oncology | Admitting: Oncology

## 2014-03-21 DIAGNOSIS — Z1231 Encounter for screening mammogram for malignant neoplasm of breast: Secondary | ICD-10-CM

## 2014-03-21 DIAGNOSIS — C782 Secondary malignant neoplasm of pleura: Principal | ICD-10-CM

## 2014-03-21 DIAGNOSIS — Z9011 Acquired absence of right breast and nipple: Secondary | ICD-10-CM

## 2014-03-21 DIAGNOSIS — C50919 Malignant neoplasm of unspecified site of unspecified female breast: Secondary | ICD-10-CM

## 2014-03-24 ENCOUNTER — Telehealth: Payer: Self-pay | Admitting: *Deleted

## 2014-03-24 NOTE — Telephone Encounter (Signed)
Patient called back - gave her results of bone density as noted below by Dr. Marko Plume. Patient agreeable to continue calcium + D twice daily and will try to do weight bearing exercises.

## 2014-03-24 NOTE — Telephone Encounter (Signed)
Called and left voicemail for patient to call us back.

## 2014-03-24 NOTE — Telephone Encounter (Signed)
Message copied by Christa See on Thu Mar 24, 2014 10:53 AM ------      Message from: Gordy Levan      Created: Wed Mar 23, 2014 12:21 PM       Labs seen and need follow up: please let her know slightly low bone density in osteopenic range. Continue calcium + D one tablet twice daily and weight bearing exercise as possible ------

## 2014-06-29 ENCOUNTER — Ambulatory Visit (HOSPITAL_COMMUNITY)
Admission: RE | Admit: 2014-06-29 | Discharge: 2014-06-29 | Disposition: A | Discharge status: Home / Self Care | Payer: Medicare Other | Source: Ambulatory Visit | Attending: Oncology | Admitting: Oncology

## 2014-06-29 ENCOUNTER — Other Ambulatory Visit (HOSPITAL_BASED_OUTPATIENT_CLINIC_OR_DEPARTMENT_OTHER): Payer: 59

## 2014-06-29 ENCOUNTER — Other Ambulatory Visit: Payer: Medicare Other

## 2014-06-29 ENCOUNTER — Encounter (HOSPITAL_COMMUNITY): Payer: Self-pay

## 2014-06-29 DIAGNOSIS — K760 Fatty (change of) liver, not elsewhere classified: Secondary | ICD-10-CM | POA: Diagnosis not present

## 2014-06-29 DIAGNOSIS — C782 Secondary malignant neoplasm of pleura: Principal | ICD-10-CM

## 2014-06-29 DIAGNOSIS — C50911 Malignant neoplasm of unspecified site of right female breast: Secondary | ICD-10-CM | POA: Insufficient documentation

## 2014-06-29 DIAGNOSIS — Z853 Personal history of malignant neoplasm of breast: Secondary | ICD-10-CM

## 2014-06-29 DIAGNOSIS — C50919 Malignant neoplasm of unspecified site of unspecified female breast: Secondary | ICD-10-CM

## 2014-06-29 DIAGNOSIS — Z79899 Other long term (current) drug therapy: Secondary | ICD-10-CM

## 2014-06-29 LAB — CBC WITH DIFFERENTIAL/PLATELET
BASO%: 1 % (ref 0.0–2.0)
Basophils Absolute: 0 10*3/uL (ref 0.0–0.1)
EOS ABS: 0.1 10*3/uL (ref 0.0–0.5)
EOS%: 3.2 % (ref 0.0–7.0)
HEMATOCRIT: 42.7 % (ref 34.8–46.6)
HGB: 14.2 g/dL (ref 11.6–15.9)
LYMPH%: 32.9 % (ref 14.0–49.7)
MCH: 29.4 pg (ref 25.1–34.0)
MCHC: 33.2 g/dL (ref 31.5–36.0)
MCV: 88.5 fL (ref 79.5–101.0)
MONO#: 0.5 10*3/uL (ref 0.1–0.9)
MONO%: 12.1 % (ref 0.0–14.0)
NEUT%: 50.8 % (ref 38.4–76.8)
NEUTROS ABS: 2.2 10*3/uL (ref 1.5–6.5)
PLATELETS: 210 10*3/uL (ref 145–400)
RBC: 4.83 10*6/uL (ref 3.70–5.45)
RDW: 13.4 % (ref 11.2–14.5)
WBC: 4.4 10*3/uL (ref 3.9–10.3)
lymph#: 1.4 10*3/uL (ref 0.9–3.3)

## 2014-06-29 LAB — COMPREHENSIVE METABOLIC PANEL (CC13)
ALT: 26 U/L (ref 0–55)
AST: 28 U/L (ref 5–34)
Albumin: 4 g/dL (ref 3.5–5.0)
Alkaline Phosphatase: 78 U/L (ref 40–150)
Anion Gap: 11 mEq/L (ref 3–11)
BUN: 17.3 mg/dL (ref 7.0–26.0)
CO2: 22 meq/L (ref 22–29)
Calcium: 9.9 mg/dL (ref 8.4–10.4)
Chloride: 108 mEq/L (ref 98–109)
Creatinine: 1 mg/dL (ref 0.6–1.1)
EGFR: 56 mL/min/{1.73_m2} — ABNORMAL LOW (ref >90)
Glucose: 103 mg/dl (ref 70–140)
Potassium: 4.1 mEq/L (ref 3.5–5.1)
SODIUM: 141 meq/L (ref 136–145)
TOTAL PROTEIN: 7.1 g/dL (ref 6.4–8.3)
Total Bilirubin: 0.94 mg/dL (ref 0.20–1.20)

## 2014-06-29 MED ORDER — IOHEXOL 300 MG/ML  SOLN
100.0000 mL | Freq: Once | INTRAMUSCULAR | Status: AC | PRN
  Administered 2014-06-29: 100 mL via INTRAVENOUS

## 2014-06-30 LAB — CANCER ANTIGEN 27.29: CA 27.29: 277 U/mL — AB (ref 0–39)

## 2014-06-30 LAB — LIPID PANEL
Cholesterol: 148 mg/dL (ref 0–200)
HDL: 50 mg/dL (ref >39)
LDL CALC: 57 mg/dL (ref 0–99)
Total CHOL/HDL Ratio: 3 Ratio
Triglycerides: 207 mg/dL — ABNORMAL HIGH (ref <150)
VLDL: 41 mg/dL — ABNORMAL HIGH (ref 0–40)

## 2014-07-03 ENCOUNTER — Other Ambulatory Visit: Payer: Self-pay | Admitting: Oncology

## 2014-07-06 ENCOUNTER — Encounter: Payer: Self-pay | Admitting: Oncology

## 2014-07-06 ENCOUNTER — Telehealth: Payer: Self-pay | Admitting: Oncology

## 2014-07-06 ENCOUNTER — Ambulatory Visit (HOSPITAL_BASED_OUTPATIENT_CLINIC_OR_DEPARTMENT_OTHER): Payer: 59 | Admitting: Oncology

## 2014-07-06 VITALS — BP 139/75 | HR 78 | Temp 98.5°F | Resp 18 | Ht 64.0 in | Wt 187.8 lb

## 2014-07-06 DIAGNOSIS — M8580 Other specified disorders of bone density and structure, unspecified site: Secondary | ICD-10-CM

## 2014-07-06 DIAGNOSIS — M170 Bilateral primary osteoarthritis of knee: Secondary | ICD-10-CM

## 2014-07-06 DIAGNOSIS — C782 Secondary malignant neoplasm of pleura: Secondary | ICD-10-CM

## 2014-07-06 DIAGNOSIS — C50911 Malignant neoplasm of unspecified site of right female breast: Secondary | ICD-10-CM

## 2014-07-06 DIAGNOSIS — Z79811 Long term (current) use of aromatase inhibitors: Secondary | ICD-10-CM

## 2014-07-06 DIAGNOSIS — Z9109 Other allergy status, other than to drugs and biological substances: Secondary | ICD-10-CM

## 2014-07-06 DIAGNOSIS — Z79899 Other long term (current) drug therapy: Secondary | ICD-10-CM

## 2014-07-06 NOTE — Telephone Encounter (Signed)
Pt confirmed labs/ov per 12/16 POF, gave pt AVS.... KJ °

## 2014-07-06 NOTE — Progress Notes (Signed)
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 [name] She had repeat CT CAP on 06-29-14, previous scans having been CT CAP in 12-2009 and CT chest 12-2011. Present scan has superior mediastinal nodes up to 1.2 cm and a 1 cm subcarinal node, and stable slight thickening and nodularity of right pleura, with no lung masses, and no findings of concern in liver, adrenals, kidneys, pelvis, bones and no other adenopathy.  Patient had URI beginning October which "lasted 4 weeks", some improvement with one Z pack then. She had fever blister around that time, improved with Valacyclovir by Dr Philip Aspen. She has otherwise been well since she was here last, with no complaints that seem concerning from standpoint of the metastatic breast cancer or that ongoing treatment.  No PAC Declined flu vaccine  Patient had been extremely reluctant to have CT done, but tells me that she tolerated this with no difficulty and that all radiology staff were helpful and very nice.  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 Recurrent to pleura 2010.     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   01/18/2013 Initial Diagnosis 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  The right breast carcinoma initially was T2 N1, ER/PR-positive in December 1998, treated with mastectomy with 12 node axillary evaluation and adjuvant Adriamycin/Cytoxan followed by 5 years of tamoxifen through December 2003. The breast cancer was found metastatic to pleura, hormone positive in early 2010, treated with VATS sclerosis by Dr.Burney April 2010 and on Femara also since April  2010. CA 2729 was 434 in April 2010. She has tolerated Femara well and clinically has continued to do very well, though CA 27.29 has never normalized. Last CT CAP and PET were in June 2011 and last CT chest 12-2011. She had bone density scan at [facility] 03-26-13, with osteopenia stable. Left tomo mammogram was at [facility] 03-23-13.  Review of systems as above, also: No new or different pain. No LE swelling. No bleeding.  Remainder of 10 point Review of Systems negative.  Objective:  Vital signs in last 24 hours:  BP 139/75 mmHg  Pulse 78  Temp(Src) 98.5 F (36.9 C) (Oral)  Resp 18  Ht $R'5\' 4"'SQ$  (1.626 m)  Wt 187 lb 12.8 oz (85.186 kg)  BMI 32.22 kg/m2  SpO2 98%  Alert, oriented and appropriate. Ambulatory without assistance.   HEENT:PERRL, sclerae not icteric. Oral mucosa moist without lesions, posterior pharynx clear.  Neck supple. No JVD.  Lymphatics:no cervical,supraclavicular, axillary or inguinal adenopathy Resp: clear to auscultation bilaterally and normal percussion bilaterally Cardio: regular rate and rhythm. No gallop. GI: soft, nontender, not distended, no mass or organomegaly. Normally active bowel sounds. Surgical incision not remarkable. Musculoskeletal/ Extremities: without pitting edema, cords, tenderness Neuro: no peripheral neuropathy. Otherwise nonfocal. PSYCH appropriate mood and affect Skin without rash, ecchymosis, petechiae Breasts: Right mastectomy scar without evidence of local recurrence, left without dominant mass, skin or nipple findings. Axillae benign.   Lab Results:  Results for orders placed or performed in visit on 06/29/14  CBC with Differential  Result Value Ref Range   WBC 4.4 3.9 - 10.3 10e3/uL   NEUT# 2.2 1.5 - 6.5 10e3/uL  HGB 14.2 11.6 - 15.9 g/dL   HCT 42.7 34.8 - 46.6 %   Platelets 210 145 - 400 10e3/uL   MCV 88.5 79.5 - 101.0 fL   MCH 29.4 25.1 - 34.0 pg   MCHC 33.2 31.5 - 36.0 g/dL   RBC 4.83 3.70 - 5.45 10e6/uL   RDW 13.4  11.2 - 14.5 %   lymph# 1.4 0.9 - 3.3 10e3/uL   MONO# 0.5 0.1 - 0.9 10e3/uL   Eosinophils Absolute 0.1 0.0 - 0.5 10e3/uL   Basophils Absolute 0.0 0.0 - 0.1 10e3/uL   NEUT% 50.8 38.4 - 76.8 %   LYMPH% 32.9 14.0 - 49.7 %   MONO% 12.1 0.0 - 14.0 %   EOS% 3.2 0.0 - 7.0 %   BASO% 1.0 0.0 - 2.0 %  Comprehensive metabolic panel (Cmet) - [facility]  Result Value Ref Range   Sodium 141 136 - 145 mEq/L   Potassium 4.1 3.5 - 5.1 mEq/L   Chloride 108 98 - 109 mEq/L   CO2 22 22 - 29 mEq/L   Glucose 103 70 - 140 mg/dl   BUN 17.3 7.0 - 26.0 mg/dL   Creatinine 1.0 0.6 - 1.1 mg/dL   Total Bilirubin 0.94 0.20 - 1.20 mg/dL   Alkaline Phosphatase 78 40 - 150 U/L   AST 28 5 - 34 U/L   ALT 26 0 - 55 U/L   Total Protein 7.1 6.4 - 8.3 g/dL   Albumin 4.0 3.5 - 5.0 g/dL   Calcium 9.9 8.4 - 10.4 mg/dL   Anion Gap 11 3 - 11 mEq/L   EGFR 56 (L) >90 ml/min/1.73 m2  Lipid panel  Result Value Ref Range   Cholesterol 148 0 - 200 mg/dL   Triglycerides 207 (H) <150 mg/dL   HDL 50 >39 mg/dL   Total CHOL/HDL Ratio 3.0 Ratio   VLDL 41 (H) 0 - 40 mg/dL   LDL Cholesterol 57 0 - 99 mg/dL  Cancer antigen 27.29  Result Value Ref Range   CA 27.29 277 (H) 0 - 39 U/mL   CA 2729 had been 257 in 02-2014, 259 in 10-2013 and 276 in 07-2013  Studies/Results: CT CHEST, ABDOMEN, AND PELVIS WITH CONTRAST  06-29-14  COMPARISON: 01/10/2010  FINDINGS: CT CHEST FINDINGS  Mediastinum/Hilar Regions: New superior mediastinal lymphadenopathy is seen the level of thoracic inlet with lymph nodes measuring up to 12 mm on image 6 of series 2. A 10 mm subcarinal lymph node is seen on image 24. No other pathologically enlarged nodes identified.  Other Thoracic Lymphadenopathy: None.  Lungs: No pulmonary infiltrate or mass identified.  Pleura: Diffuse right-sided pleural thickening and nodularity is stable with high attenuation masslike opacity in the posterior right lung base. These findings show no significant change in a  most likely due to previous talc pleurodesis.  Vascular/Cardiac: No acute findings identified.  Other: None.  Musculoskeletal: No suspicious bone lesions identified.  CT ABDOMEN AND PELVIS FINDINGS  Hepatobiliary: Mild diffuse hepatic steatosis noted. No liver masses are identified. Gallbladder is unremarkable.  Pancreas: No mass, inflammatory changes, or other parenchymal abnormality identified.  Spleen: Within normal limits in size and appearance.  Adrenal Glands: No mass identified.  Kidneys/Urinary Tract: No masses identified. No evidence of hydronephrosis.  Stomach/Bowel/Peritoneum: No evidence of wall thickening, mass, or obstruction.  Vascular/Lymphatic: No pathologically enlarged lymph nodes identified. No other significant abnormality identified.  Reproductive: Prior hysterectomy again noted. Adnexal regions are unremarkable  Other: None.  Musculoskeletal: No suspicious bone lesions identified.  IMPRESSION: New mild mediastinal lymphadenopathy in the superior mediastinum and subcarinal regions. Metastatic disease cannot be excluded.  Changes related to previous right-sided talc pleurodesis, without significant change since prior exam.  No other sites of metastatic disease identified within the chest, abdomen, or pelvis.  Stable diffuse hepatic steatosis.  CT report above reviewed with patient and mother, as well as PACs images of the scans.  Medications: I have reviewed the patient's current medications.  DISCUSSION: minimally enlarged central chest nodes by CT criteria, not known if these are related to the metastatic breast cancer, recent respiratory illness or other, otherwise metastatic disease appears still controlled. Continue letrozole, follow  Assessment/Plan: 1. 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 CT appears stable. Continue letrozole, see again in 3-4 months with labs. 2.osteopenia stable, on calcium, D and drinks lots of milk. Yearly bone density scans necessary on continued high risk aromatase inhibitor  3.HTN stable  4.degenerative arthritis less bothersome in knees since weight loss. It is possible that some of the joint symptoms are related to AI, tho not new overall 5.environmental allergies on desensitization shots by Dr Donneta Romberg, stable  6.intentional weight loss: stable. Still hepatic steatosis 7.declined flu vaccine previously 8. fasting lipids noted, will send results with this note to PCP 9.past tobacco, quit 1998 10.prolonged URI symptoms Oct 2015, now resolved. 11.declines flu vaccine, despite discussion  All questions answered. Patient knows that she can call prior to next scheduled visit if needed. Time spent 25 min including >50% counseling and coordination of care.  Jacon Whetzel P, MD   07/06/2014, 10:23 AM

## 2014-10-21 ENCOUNTER — Other Ambulatory Visit: Payer: Self-pay

## 2014-10-21 DIAGNOSIS — C50919 Malignant neoplasm of unspecified site of unspecified female breast: Secondary | ICD-10-CM

## 2014-10-21 DIAGNOSIS — C782 Secondary malignant neoplasm of pleura: Principal | ICD-10-CM

## 2014-10-22 ENCOUNTER — Other Ambulatory Visit: Payer: Self-pay | Admitting: Oncology

## 2014-10-24 ENCOUNTER — Other Ambulatory Visit (HOSPITAL_BASED_OUTPATIENT_CLINIC_OR_DEPARTMENT_OTHER): Payer: Medicare Other

## 2014-10-24 DIAGNOSIS — C782 Secondary malignant neoplasm of pleura: Secondary | ICD-10-CM

## 2014-10-24 DIAGNOSIS — C50911 Malignant neoplasm of unspecified site of right female breast: Secondary | ICD-10-CM | POA: Diagnosis not present

## 2014-10-24 DIAGNOSIS — C50919 Malignant neoplasm of unspecified site of unspecified female breast: Secondary | ICD-10-CM

## 2014-10-24 LAB — COMPREHENSIVE METABOLIC PANEL (CC13)
ALK PHOS: 73 U/L (ref 40–150)
ALT: 23 U/L (ref 0–55)
ANION GAP: 12 meq/L — AB (ref 3–11)
AST: 26 U/L (ref 5–34)
Albumin: 3.9 g/dL (ref 3.5–5.0)
BILIRUBIN TOTAL: 0.98 mg/dL (ref 0.20–1.20)
BUN: 20.1 mg/dL (ref 7.0–26.0)
CO2: 22 meq/L (ref 22–29)
Calcium: 10.2 mg/dL (ref 8.4–10.4)
Chloride: 107 mEq/L (ref 98–109)
Creatinine: 0.9 mg/dL (ref 0.6–1.1)
EGFR: 62 mL/min/{1.73_m2} — AB (ref >90)
GLUCOSE: 116 mg/dL (ref 70–140)
Potassium: 3.9 mEq/L (ref 3.5–5.1)
Sodium: 141 mEq/L (ref 136–145)
Total Protein: 7.1 g/dL (ref 6.4–8.3)

## 2014-10-24 LAB — CBC WITH DIFFERENTIAL/PLATELET
BASO%: 1.1 % (ref 0.0–2.0)
Basophils Absolute: 0.1 10*3/uL (ref 0.0–0.1)
EOS%: 3.7 % (ref 0.0–7.0)
Eosinophils Absolute: 0.2 10*3/uL (ref 0.0–0.5)
HEMATOCRIT: 42.2 % (ref 34.8–46.6)
HGB: 14.3 g/dL (ref 11.6–15.9)
LYMPH%: 32.2 % (ref 14.0–49.7)
MCH: 29 pg (ref 25.1–34.0)
MCHC: 33.9 g/dL (ref 31.5–36.0)
MCV: 85.5 fL (ref 79.5–101.0)
MONO#: 0.5 10*3/uL (ref 0.1–0.9)
MONO%: 11.3 % (ref 0.0–14.0)
NEUT%: 51.7 % (ref 38.4–76.8)
NEUTROS ABS: 2.5 10*3/uL (ref 1.5–6.5)
Platelets: 206 10*3/uL (ref 145–400)
RBC: 4.93 10*6/uL (ref 3.70–5.45)
RDW: 13.2 % (ref 11.2–14.5)
WBC: 4.8 10*3/uL (ref 3.9–10.3)
lymph#: 1.5 10*3/uL (ref 0.9–3.3)

## 2014-10-24 LAB — CANCER ANTIGEN 27.29: CA 27.29: 256 U/mL — AB (ref 0–39)

## 2014-10-27 ENCOUNTER — Encounter: Payer: Self-pay | Admitting: Oncology

## 2014-10-27 ENCOUNTER — Ambulatory Visit (HOSPITAL_BASED_OUTPATIENT_CLINIC_OR_DEPARTMENT_OTHER): Payer: Medicare Other | Admitting: Oncology

## 2014-10-27 ENCOUNTER — Telehealth: Payer: Self-pay | Admitting: Oncology

## 2014-10-27 VITALS — BP 140/77 | HR 80 | Temp 98.2°F | Resp 18 | Ht 64.0 in | Wt 187.4 lb

## 2014-10-27 DIAGNOSIS — C782 Secondary malignant neoplasm of pleura: Secondary | ICD-10-CM

## 2014-10-27 DIAGNOSIS — C50911 Malignant neoplasm of unspecified site of right female breast: Secondary | ICD-10-CM | POA: Diagnosis not present

## 2014-10-27 DIAGNOSIS — I10 Essential (primary) hypertension: Secondary | ICD-10-CM

## 2014-10-27 DIAGNOSIS — M859 Disorder of bone density and structure, unspecified: Secondary | ICD-10-CM

## 2014-10-27 DIAGNOSIS — Z79899 Other long term (current) drug therapy: Secondary | ICD-10-CM

## 2014-10-27 DIAGNOSIS — Z1231 Encounter for screening mammogram for malignant neoplasm of breast: Secondary | ICD-10-CM

## 2014-10-27 DIAGNOSIS — M8580 Other specified disorders of bone density and structure, unspecified site: Secondary | ICD-10-CM

## 2014-10-27 NOTE — Progress Notes (Signed)
OFFICE PROGRESS NOTE   October 27, 2014   [place], [place].Lisa [name]  INTERVAL HISTORY:  Patient is seen, together with 72 yo mother, in scheduled follow up of right breast cancer metastatic to pleura, for which she has been stable on  Femara since April 2010. Last scans were CT CAP 06-2014; CA 2729 remains elevated but stable. She has low bone density, yearly bone density scan medically necessary on high risk aromatase inhibitor.  Patient has had no symptoms that seem concerning for progression of the metastatic breast cancer since she was here 4 months ago, with no increased SOB or other chest symptoms, no new or different pain, energy and appetite at baseline.  No PAC Declined flu vaccine  Mother had pacer placed by Dr Jolyn Nap.  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 Recurrent to pleura 2010.     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   01/18/2013 Initial Diagnosis 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  The right breast carcinoma initially was T2 N1, ER/PR-positive in December 1998, treated with mastectomy with 12 node axillary evaluation and adjuvant Adriamycin/Cytoxan followed by 5 years of tamoxifen through December 2003. 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 CA 2729 was 434 in April 2010. She has tolerated Femara well and clinically has continued to do very well, though CA 27.29 has never normalized. Last CT CAP and PET were in June 2011 and last CT chest 12-2011. She had bone density scan at [facility] 03-21-14, with osteopenia stable. Left tomo mammogram was at [facility] 03-22-14  Review of systems as above, also: Environmental allergies without recent flare,  sinuses generally more congested on left. No recent illness. No GI symptoms. No increased LE swelling.  Remainder of 10 point Review of Systems negative.  Objective:  Vital signs in last 24 hours:  BP 140/77 mmHg  Pulse 80  Temp(Src) 98.2 F (36.8 C) (Oral)  Resp 18  Ht _0  (1.626 m)  Wt 187 lb 6.4 oz (85.004 kg)  BMI 32.15 kg/m2  SpO2 99% Weight stable Alert, oriented and appropriate. Ambulatory without assistance. Looks comfortable  HEENT:PERRL, sclerae not icteric. Oral mucosa moist without lesions, posterior pharynx clear.  Neck supple. No JVD.  Lymphatics:no cervical,supraclavicular, axillary adenopathy Resp: clear to auscultation bilaterally and normal percussion bilaterally including RLL Cardio: regular rate and rhythm. No gallop. GI: soft, nontender, not distended, no mass or organomegaly. Normally active bowel sounds. Musculoskeletal/ Extremities: without pitting edema, cords, tenderness Neuro: nonfocal. Psych appropriate mood and affect Skin without rash, ecchymosis, petechiae Breasts:Right mastectomy scar without evidence of local recurrence. Left without dominant mass, skin or nipple findings. Axillae benign.   Lab Results:  Results for orders placed or performed in visit on 10/24/14  CBC with Differential  Result Value Ref Range   WBC 4.8 3.9 - 10.3 10e3/uL   NEUT# 2.5 1.5 - 6.5 10e3/uL   HGB 14.3 11.6 - 15.9 g/dL   HCT 42.2 34.8 - 46.6 %   Platelets 206 145 - 400 10e3/uL   MCV 85.5 79.5 - 101.0 fL   MCH 29.0 25.1 - 34.0 pg   MCHC 33.9 31.5 - 36.0 g/dL   RBC 4.93 3.70 - 5.45 10e6/uL   RDW 13.2 11.2 - 14.5 %  lymph# 1.5 0.9 - 3.3 10e3/uL   MONO# 0.5 0.1 - 0.9 10e3/uL   Eosinophils Absolute 0.2 0.0 - 0.5 10e3/uL   Basophils Absolute 0.1 0.0 - 0.1 10e3/uL   NEUT% 51.7 38.4 - 76.8 %   LYMPH% 32.2 14.0 - 49.7 %   MONO% 11.3 0.0 - 14.0 %   EOS% 3.7 0.0 - 7.0 %   BASO% 1.1 0.0 - 2.0 %  Comprehensive metabolic panel (Cmet) - [facility]  Result Value Ref Range    Sodium 141 136 - 145 mEq/L   Potassium 3.9 3.5 - 5.1 mEq/L   Chloride 107 98 - 109 mEq/L   CO2 22 22 - 29 mEq/L   Glucose 116 70 - 140 mg/dl   BUN 20.1 7.0 - 26.0 mg/dL   Creatinine 0.9 0.6 - 1.1 mg/dL   Total Bilirubin 0.98 0.20 - 1.20 mg/dL   Alkaline Phosphatase 73 40 - 150 U/L   AST 26 5 - 34 U/L   ALT 23 0 - 55 U/L   Total Protein 7.1 6.4 - 8.3 g/dL   Albumin 3.9 3.5 - 5.0 g/dL   Calcium 10.2 8.4 - 10.4 mg/dL   Anion Gap 12 (H) 3 - 11 mEq/L   EGFR 62 (L) >90 ml/min/1.73 m2  CA 27.29  Result Value Ref Range   CA 27.29 256 (H) 0 - 39 U/mL    CA 2729 comparison: 277 on 06-30-15 and 257 on 03-07-14 Lipids from 06-29-14 had cholesterol 148, Trig 207, HDL 50, Tot chol/HDL 3.0, VLDL 41, LDL 57  Studies/Results:  No results found.. [facility] early Sept  Medications: I have reviewed the patient's current medications. Continue Femara. She is on calcium with D; has not had prolia. She is on ferrous sulfate 2x weekly with vit c, and zocor.  DISCUSSION: clinically stable now on Femara x 6 years for path proven metastatic breast ca to pleura which developed 12 years after initial diagnosis. Due to timing of mammogram and bone density scan, I will see her early Sept just after those studies or certainly sooner if any concerns.  This note and labs to Dr Philip Aspen, who is to see her 11-11-14.  Assessment/Plan:  1. 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 Recent CT stable. Continue letrozole, see again after imaging with labs. 2.osteopenia stable, on calcium, D and drinks lots of milk. Yearly bone density scans necessary on continued high risk aromatase inhibitor; this is always difficult to get coded correctly by imaging facility for insurance purposes, but is medically necessary as noted.. She  has not wanted other interventions tho Prolia would be consideration if bone density lower (not discussed today) 3.HTN stable  4.degenerative arthritis less bothersome in knees since weight loss. It is possible that some of the joint symptoms are related to AI, tho not new overall 5.environmental allergies on desensitization shots by Dr Donneta Romberg, stable  6.intentional weight loss: stable. Still hepatic steatosis by CT 7.declined flu vaccine  8. fasting lipids followed with aromatase inhibitor in addition to other medical indications 9.past tobacco, quit 1998    All questions answered and patient is in agreement with plans. She and mother are very appreciative of recent care for mother's cardiac problem. Patient knows to call if any concerns prior to next scheduled visit. Time spent 25 min including >50% counseling and coordination of  care.Cc this note Dr Philip Aspen and Dr Myles Rosenthal, MD   10/27/2014, 9:54 AM

## 2014-10-27 NOTE — Telephone Encounter (Signed)
Appointments made and avs printed for patient °

## 2015-03-23 ENCOUNTER — Ambulatory Visit
Admission: RE | Admit: 2015-03-23 | Discharge: 2015-03-23 | Disposition: A | Discharge status: Home / Self Care | Payer: Medicare Other | Source: Ambulatory Visit | Attending: Oncology | Admitting: Oncology

## 2015-03-23 ENCOUNTER — Other Ambulatory Visit: Payer: Self-pay | Admitting: Oncology

## 2015-03-23 DIAGNOSIS — Z1231 Encounter for screening mammogram for malignant neoplasm of breast: Secondary | ICD-10-CM

## 2015-03-23 DIAGNOSIS — Z79899 Other long term (current) drug therapy: Secondary | ICD-10-CM

## 2015-03-23 DIAGNOSIS — M8580 Other specified disorders of bone density and structure, unspecified site: Secondary | ICD-10-CM

## 2015-03-23 DIAGNOSIS — M859 Disorder of bone density and structure, unspecified: Secondary | ICD-10-CM

## 2015-03-24 ENCOUNTER — Other Ambulatory Visit (HOSPITAL_BASED_OUTPATIENT_CLINIC_OR_DEPARTMENT_OTHER): Payer: Medicare Other

## 2015-03-24 DIAGNOSIS — C50911 Malignant neoplasm of unspecified site of right female breast: Secondary | ICD-10-CM

## 2015-03-24 DIAGNOSIS — C782 Secondary malignant neoplasm of pleura: Principal | ICD-10-CM

## 2015-03-24 LAB — CBC WITH DIFFERENTIAL/PLATELET
BASO%: 0.6 % (ref 0.0–2.0)
BASOS ABS: 0 10*3/uL (ref 0.0–0.1)
EOS%: 5.9 % (ref 0.0–7.0)
Eosinophils Absolute: 0.3 10*3/uL (ref 0.0–0.5)
HEMATOCRIT: 39.5 % (ref 34.8–46.6)
HEMOGLOBIN: 13.8 g/dL (ref 11.6–15.9)
LYMPH#: 1.5 10*3/uL (ref 0.9–3.3)
LYMPH%: 28.5 % (ref 14.0–49.7)
MCH: 30.1 pg (ref 25.1–34.0)
MCHC: 34.9 g/dL (ref 31.5–36.0)
MCV: 86.2 fL (ref 79.5–101.0)
MONO#: 0.6 10*3/uL (ref 0.1–0.9)
MONO%: 12.2 % (ref 0.0–14.0)
NEUT#: 2.7 10*3/uL (ref 1.5–6.5)
NEUT%: 52.8 % (ref 38.4–76.8)
PLATELETS: 194 10*3/uL (ref 145–400)
RBC: 4.58 10*6/uL (ref 3.70–5.45)
RDW: 13.2 % (ref 11.2–14.5)
WBC: 5.1 10*3/uL (ref 3.9–10.3)

## 2015-03-24 LAB — COMPREHENSIVE METABOLIC PANEL (CC13)
ALBUMIN: 3.9 g/dL (ref 3.5–5.0)
ALK PHOS: 88 U/L (ref 40–150)
ALT: 25 U/L (ref 0–55)
ANION GAP: 8 meq/L (ref 3–11)
AST: 27 U/L (ref 5–34)
BUN: 21.7 mg/dL (ref 7.0–26.0)
CALCIUM: 11.7 mg/dL — AB (ref 8.4–10.4)
CO2: 24 mEq/L (ref 22–29)
CREATININE: 1 mg/dL (ref 0.6–1.1)
Chloride: 109 mEq/L (ref 98–109)
EGFR: 57 mL/min/{1.73_m2} — ABNORMAL LOW (ref >90)
Glucose: 104 mg/dl (ref 70–140)
POTASSIUM: 4 meq/L (ref 3.5–5.1)
Sodium: 141 mEq/L (ref 136–145)
Total Bilirubin: 0.94 mg/dL (ref 0.20–1.20)
Total Protein: 7.1 g/dL (ref 6.4–8.3)

## 2015-03-24 LAB — CANCER ANTIGEN 27.29: CA 27.29: 262 U/mL — AB (ref 0–39)

## 2015-03-26 ENCOUNTER — Other Ambulatory Visit: Payer: Self-pay | Admitting: Oncology

## 2015-03-30 ENCOUNTER — Ambulatory Visit (HOSPITAL_BASED_OUTPATIENT_CLINIC_OR_DEPARTMENT_OTHER): Payer: Medicare Other | Admitting: Oncology

## 2015-03-30 ENCOUNTER — Telehealth: Payer: Self-pay | Admitting: Oncology

## 2015-03-30 ENCOUNTER — Encounter: Payer: Self-pay | Admitting: Oncology

## 2015-03-30 VITALS — BP 150/73 | HR 72 | Temp 98.7°F | Resp 18 | Ht 64.0 in | Wt 190.2 lb

## 2015-03-30 DIAGNOSIS — Z91048 Other nonmedicinal substance allergy status: Secondary | ICD-10-CM

## 2015-03-30 DIAGNOSIS — Z79899 Other long term (current) drug therapy: Secondary | ICD-10-CM

## 2015-03-30 DIAGNOSIS — C782 Secondary malignant neoplasm of pleura: Secondary | ICD-10-CM | POA: Diagnosis not present

## 2015-03-30 DIAGNOSIS — Z9109 Other allergy status, other than to drugs and biological substances: Secondary | ICD-10-CM

## 2015-03-30 DIAGNOSIS — M859 Disorder of bone density and structure, unspecified: Secondary | ICD-10-CM

## 2015-03-30 DIAGNOSIS — C50911 Malignant neoplasm of unspecified site of right female breast: Secondary | ICD-10-CM | POA: Diagnosis not present

## 2015-03-30 DIAGNOSIS — M8580 Other specified disorders of bone density and structure, unspecified site: Secondary | ICD-10-CM | POA: Diagnosis not present

## 2015-03-30 NOTE — Telephone Encounter (Signed)
Gave avs. Will schedule once MD schedule is open

## 2015-03-30 NOTE — Progress Notes (Signed)
OFFICE PROGRESS NOTE   March 30, 2015   Lisa [name], [place].Lisa [name]  INTERVAL HISTORY:  Patient is seen, together with 72 yo mother, in scheduled follow up of breast cancer which has been metastatic to pleura since early 2010, continuing Femara which was begun in 10-2008. She continues to tolerate the Femara well. Left tomo mammogram at [facility] 03-24-15 had no findings of concern, and bone density scan also at [facility] 03-24-15 had stable osteopenia, with T scores -1.7 both LS spine and femoral neck.   Patient has no complaints that seem related to the breast cancer history or ongoing treatment with aromatase inhibitor, specifically no increased SOB or chest discomfort, some wheezing when lies on left side which apparently is not new, no new or different pain. Energy has been at baseline, appetite good, no bleeding, no noted changes left breast.    No PAC Declines flu vaccine  ONCOLOGIC HISTORY The right breast carcinoma initially was T2 N1, ER/PR-positive in December 1998, treated with mastectomy with 12 node axillary evaluation and adjuvant Adriamycin/Cytoxan followed by 5 years of tamoxifen through December 2003. 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 CA 2729 was 434 in April 2010. She has tolerated Femara well and clinically has continued to do very well, though CA 27.29 has never normalized. Last CT CAP and PET were in June 2011 and last CT chest 12-2011. She had bone density scan at [facility] 03-23-15, with osteopenia stable. Left tomo mammogram was at [facility] 03-24-15    Review of systems as above, also:  Environmental allergies more bothersome since she stopped Claritin in early July due to media reports of memory problems possibly associated. No change in bowels. Knee symptoms no worse. Remainder of 10 point Review of  Systems negative.  Objective:  Vital signs in last 24 hours:  BP 150/73 mmHg  Pulse 72  Temp(Src) 98.7 F (37.1 C) (Oral)  Resp 18  Ht 5' 4"  (1.626 m)  Wt 190 lb 3.2 oz (86.274 kg)  BMI 32.63 kg/m2 Weight up 3 lbs Alert, oriented and appropriate. Ambulatory without assistance.   HEENT:PERRL, sclerae not icteric. Oral mucosa moist without lesions, posterior pharynx with bilateral dull erythema, no exudate. Nasal turbinates slightly erythematous and boggy no purulence. Neck supple. No JVD.  Lymphatics:no cervical,supraclavicular, axillary adenopathy Resp: clear to auscultation bilaterally and normal percussion bilaterally, no wheezing upright Cardio: regular rate and rhythm. No gallop. GI: soft, nontender, not distended, no mass or organomegaly. Normally active bowel sounds. Surgical incision not remarkable. Musculoskeletal/ Extremities: without pitting edema, cords, tenderness Neuro: no peripheral neuropathy. Otherwise nonfocal. PSYCH appropriate mood and affect Skin without rash, ecchymosis, petechiae Breasts:Right mastectomy scar not remarkable, left breast without dominant mass, skin or nipple findings. Axillae benign.   Lab Results:  Results for orders placed or performed in visit on 03/24/15  CBC with Differential  Result Value Ref Range   WBC 5.1 3.9 - 10.3 10e3/uL   NEUT# 2.7 1.5 - 6.5 10e3/uL   HGB 13.8 11.6 - 15.9 g/dL   HCT 39.5 34.8 - 46.6 %   Platelets 194 145 - 400 10e3/uL   MCV 86.2 79.5 - 101.0 fL   MCH 30.1 25.1 - 34.0 pg   MCHC 34.9 31.5 - 36.0 g/dL   RBC 4.58 3.70 - 5.45 10e6/uL   RDW 13.2 11.2 - 14.5 %   lymph# 1.5  0.9 - 3.3 10e3/uL   MONO# 0.6 0.1 - 0.9 10e3/uL   Eosinophils Absolute 0.3 0.0 - 0.5 10e3/uL   Basophils Absolute 0.0 0.0 - 0.1 10e3/uL   NEUT% 52.8 38.4 - 76.8 %   LYMPH% 28.5 14.0 - 49.7 %   MONO% 12.2 0.0 - 14.0 %   EOS% 5.9 0.0 - 7.0 %   BASO% 0.6 0.0 - 2.0 %  Comprehensive metabolic panel (Cmet) - [facility]  Result Value Ref Range    Sodium 141 136 - 145 mEq/L   Potassium 4.0 3.5 - 5.1 mEq/L   Chloride 109 98 - 109 mEq/L   CO2 24 22 - 29 mEq/L   Glucose 104 70 - 140 mg/dl   BUN 21.7 7.0 - 26.0 mg/dL   Creatinine 1.0 0.6 - 1.1 mg/dL   Total Bilirubin 0.94 0.20 - 1.20 mg/dL   Alkaline Phosphatase 88 40 - 150 U/L   AST 27 5 - 34 U/L   ALT 25 0 - 55 U/L   Total Protein 7.1 6.4 - 8.3 g/dL   Albumin 3.9 3.5 - 5.0 g/dL   Calcium 11.7 (H) 8.4 - 10.4 mg/dL   Anion Gap 8 3 - 11 mEq/L   EGFR 57 (L) >90 ml/min/1.73 m2  CA 27.29  Result Value Ref Range   CA 27.29 262 (H) 0 - 39 U/mL    CA 125 had been 256 in 10-2014 and 277 in 06-2014.  WIll get fasting lipids with next labs in ~ Jan 2017, which patient prefers here with other labs, and will share with Dr Philip Aspen  Studies/Results: EXAM: DIGITAL SCREENING UNILATERAL LEFT MAMMOGRAM WITH TOMO AND CAD  COMPARISON: Previous exam(s).  ACR Breast Density Category b: There are scattered areas of fibroglandular density.  FINDINGS: The patient has had a right mastectomy. There are no findings suspicious for malignancy.  Images were processed with CAD.  IMPRESSION: No mammographic evidence of malignancy. A result letter of this screening mammogram will be mailed directly to the patient.  RECOMMENDATION: Screening mammogram in one year. ([id number])  BI-RADS CATEGORY 1: Negative.    [name]  EXAM: DUAL X-RAY ABSORPTIOMETRY (DXA) FOR BONE MINERAL DENSITY  03-23-15  FINDINGS: AP LUMBAR SPINE L2 and L3  Bone Mineral Density (BMD): 0.868 g/cm2  Young Adult T-Score: -1.7  Z-Score: 0.5  LEFT FEMUR NECK  Bone Mineral Density (BMD): 0.656 g/cm2  Young Adult T-Score: -1.7  Z-Score: 0.2  ASSESSMENT: Patient's diagnostic category is LOW BONE MASS by WHO Criteria.  FRACTURE RISK: INCREASED   Medications: I have reviewed the patient's current medications. Continue Femara. Resume Claritin once daily  DISCUSSION: results of bone density  scan and mammogram discussed, marker as above. As long as disease is stable/controlled and she is tolerating Femara, will continue this.  She will have FOB testing by Dr Philip Aspen in Oct, and [place].  Fasting lipids here prior to next visit.  Assessment/Plan: 1. 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 Last CT stable 06-2014. Continue letrozole, see again 4-5 months. 2.osteopenia stable, on calcium, D and drinks lots of milk. Yearly bone density scans necessary on continued high risk aromatase inhibitor. She has not wanted Prolia  3.HTN stable  4.degenerative arthritis less bothersome in knees since weight loss. It is possible that some of the joint symptoms are related to AI, tho not new overall 5.environmental allergies on desensitization shots by  Dr Donneta Romberg: more symptomatic since she has held claritin, which she is willing to resume once daily (previously was using this bid) 6. hepatic steatosis by CT 7.declines flu vaccine again this year 8. 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 Time spent 20 min including >50% counseling and coordination of care. Cc Dr Philip Aspen, Dr Myles Rosenthal, MD   03/30/2015, 10:45 AM

## 2015-04-03 ENCOUNTER — Other Ambulatory Visit: Payer: Self-pay | Admitting: Dermatology

## 2015-04-19 ENCOUNTER — Encounter: Payer: Self-pay | Admitting: Oncology

## 2015-04-27 ENCOUNTER — Other Ambulatory Visit (HOSPITAL_COMMUNITY): Payer: Self-pay | Admitting: Internal Medicine

## 2015-04-27 ENCOUNTER — Ambulatory Visit (HOSPITAL_COMMUNITY)
Admission: RE | Admit: 2015-04-27 | Discharge: 2015-04-27 | Disposition: A | Discharge status: Home / Self Care | Payer: Medicare Other | Source: Ambulatory Visit | Attending: Internal Medicine | Admitting: Internal Medicine

## 2015-04-27 DIAGNOSIS — J90 Pleural effusion, not elsewhere classified: Secondary | ICD-10-CM

## 2015-04-27 DIAGNOSIS — M545 Low back pain: Secondary | ICD-10-CM

## 2015-04-27 DIAGNOSIS — R0602 Shortness of breath: Secondary | ICD-10-CM | POA: Insufficient documentation

## 2015-04-27 DIAGNOSIS — M5136 Other intervertebral disc degeneration, lumbar region: Secondary | ICD-10-CM | POA: Diagnosis not present

## 2015-05-23 ENCOUNTER — Other Ambulatory Visit: Payer: Self-pay

## 2015-05-23 DIAGNOSIS — Z853 Personal history of malignant neoplasm of breast: Secondary | ICD-10-CM

## 2015-05-23 MED ORDER — LETROZOLE 2.5 MG PO TABS: 2.5000 mg | ORAL_TABLET | Freq: Every day | ORAL | Status: DC

## 2015-06-16 ENCOUNTER — Telehealth: Payer: Self-pay | Admitting: Oncology

## 2015-06-16 NOTE — Telephone Encounter (Signed)
Called and left a message with 2017 appointments

## 2015-07-26 ENCOUNTER — Emergency Department (HOSPITAL_COMMUNITY)
Admission: EM | Admit: 2015-07-26 | Discharge: 2015-07-26 | Disposition: A | Discharge status: Home / Self Care | Payer: Medicare Other | Attending: Emergency Medicine | Admitting: Emergency Medicine

## 2015-07-26 ENCOUNTER — Encounter (HOSPITAL_COMMUNITY): Payer: Self-pay | Admitting: Emergency Medicine

## 2015-07-26 ENCOUNTER — Emergency Department (HOSPITAL_COMMUNITY): Payer: Medicare Other

## 2015-07-26 DIAGNOSIS — Z79899 Other long term (current) drug therapy: Secondary | ICD-10-CM | POA: Diagnosis not present

## 2015-07-26 DIAGNOSIS — R52 Pain, unspecified: Secondary | ICD-10-CM

## 2015-07-26 DIAGNOSIS — Z8709 Personal history of other diseases of the respiratory system: Secondary | ICD-10-CM | POA: Diagnosis not present

## 2015-07-26 DIAGNOSIS — S22000A Wedge compression fracture of unspecified thoracic vertebra, initial encounter for closed fracture: Secondary | ICD-10-CM

## 2015-07-26 DIAGNOSIS — G8929 Other chronic pain: Secondary | ICD-10-CM | POA: Diagnosis not present

## 2015-07-26 DIAGNOSIS — M545 Low back pain: Secondary | ICD-10-CM | POA: Diagnosis present

## 2015-07-26 DIAGNOSIS — Z87891 Personal history of nicotine dependence: Secondary | ICD-10-CM | POA: Diagnosis not present

## 2015-07-26 DIAGNOSIS — Z853 Personal history of malignant neoplasm of breast: Secondary | ICD-10-CM | POA: Insufficient documentation

## 2015-07-26 DIAGNOSIS — M8448XA Pathological fracture, other site, initial encounter for fracture: Secondary | ICD-10-CM | POA: Diagnosis not present

## 2015-07-26 HISTORY — DX: Other chronic pain: G89.29

## 2015-07-26 HISTORY — DX: Dorsalgia, unspecified: M54.9

## 2015-07-26 MED ORDER — DIAZEPAM 2 MG PO TABS
2.0000 mg | ORAL_TABLET | Freq: Once | ORAL | Status: AC
  Administered 2015-07-26: 2 mg via ORAL
  Filled 2015-07-26: qty 1

## 2015-07-26 MED ORDER — DIAZEPAM 2 MG PO TABS: 2.0000 mg | ORAL_TABLET | Freq: Four times a day (QID) | ORAL | Status: DC | PRN

## 2015-07-26 NOTE — ED Notes (Addendum)
Pt has htn but has not taken her medication.  Pt states that she has had worsened back pain since July after picking up a ladder.

## 2015-07-26 NOTE — ED Notes (Addendum)
Per EMS: Pt c/o nontraumatic back pain x 7 years.  Pt states that she has lumbar compression fractures.

## 2015-07-26 NOTE — ED Provider Notes (Signed)
CSN: [id number]     Arrival date & time 07/26/15  0931 History   First MD Initiated Contact with Patient 07/26/15 1351     Chief Complaint  Patient presents with  . Back Pain     (Consider location/radiation/quality/duration/timing/severity/associated sxs/prior Treatment) HPI Patient complains of right-sided paralumbar and parathoracic back pain onset 1.5 years ago when she lifted a ladder. Pain became worse over the past few months. She was evaluated and completed a series of treatments with a doctor of physical therapy. Pain is worse with changing positions and improved with remaining still. She denies loss of bladder or bowel control. No fever. Today pain became worse to the point where she could not get up from bed. No other symptoms. She's been treating herself with naproxen and Tylenol with partial relief. She denies fever. No other associated symptoms. Pain feels like muscle spasm and is nonradiating Past Medical History  Diagnosis Date  . Pleural effusion 11-08-2008  . Breast cancer ([place]) 07-13-1997  . Chronic back pain    Past Surgical History  Procedure Laterality Date  . Abdominal hysterectomy  02-21-1982  . Pelvic laparoscopy  11-06-1981  . Dilation and curettage, diagnostic / therapeutic  12-13-1981  . Breast biopsy  07-13-1997  . Mastectomy  07-29-1997  . Thoracentesis  10-18-2008   No family history on file. Social History  Substance Use Topics  . Smoking status: Former Smoker    Quit date: 07/22/1996  . Smokeless tobacco: None  . Alcohol Use: None   OB History    No data available     Review of Systems  Constitutional: Negative.   HENT: Negative.   Respiratory: Negative.   Cardiovascular: Negative.   Gastrointestinal: Negative.   Musculoskeletal: Positive for back pain and gait problem.       Walks with slight limp, favoring right leg chronic, for several years  Skin: Negative.   Neurological: Negative.   Psychiatric/Behavioral: Negative.   All other systems  reviewed and are negative.     Allergies  Diphenhydramine hcl; Oseltamivir phosphate; Oxycodone-acetaminophen; Prednisone; Sudafed; and Codeine sulfate  Home Medications   Prior to Admission medications   Medication Sig Start Date End Date Taking? Authorizing Provider  Calcium-Vitamin D (CALTRATE 600 PLUS-VIT D PO) Take 1 tablet by mouth daily.    Yes Historical Provider, MD  EPIPEN 2-PAK 0.3 MG/0.3ML SOAJ injection Inject 0.3 mg into the skin daily as needed (allergic reaction).  10/17/14  Yes Historical Provider, MD  ferrous sulfate 325 (65 FE) MG tablet Take 325 mg by mouth 2 (two) times a week. Wednesday and Saturdays, causes constipation so only takes it twice weekly   Yes Historical Provider, MD  letrozole (FEMARA) 2.5 MG tablet Take 1 tablet (2.5 mg total) by mouth daily. 05/23/15  Yes Lennis Marion Downer, MD  loratadine (CLARITIN) 10 MG tablet Take 10 mg by mouth daily.    Yes Historical Provider, MD  losartan (COZAAR) 50 MG tablet Take 50 mg by mouth daily.     Yes Historical Provider, MD  Multiple Vitamins-Minerals (MULTIVITAMIN WITH MINERALS) tablet Take 1 tablet by mouth daily.     Yes Historical Provider, MD  simvastatin (ZOCOR) 20 MG tablet Take 20 mg by mouth at bedtime.     Yes Historical Provider, MD  vitamin B-12 (CYANOCOBALAMIN) 1000 MCG tablet Take 1,000 mcg by mouth 2 (two) times daily.   Yes Historical Provider, MD  vitamin C (ASCORBIC ACID) 500 MG tablet Take 500 mg by mouth daily.  Yes Historical Provider, MD  vitamin E 200 UNIT capsule Take 200 Units by mouth daily.     Yes Historical Provider, MD   BP 154/86 mmHg  Pulse 89  Temp(Src) 98 F (36.7 C) (Oral)  Resp 18  Ht _0  (1.6 m)  Wt 184 lb (83.462 kg)  BMI 32.60 kg/m2  SpO2 100% Physical Exam  Constitutional: She appears well-developed and well-nourished. No distress.  Appears  uncomfortable  HENT:  Head: Normocephalic and atraumatic.  Eyes: Conjunctivae are normal. Pupils are equal, round, and  reactive to light.  Neck: Neck supple. No tracheal deviation present. No thyromegaly present.  Cardiovascular: Normal rate and regular rhythm.   No murmur heard. Pulmonary/Chest: Effort normal and breath sounds normal.  Abdominal: Soft. Bowel sounds are normal. She exhibits no distension. There is no tenderness.  Musculoskeletal: Normal range of motion. She exhibits no edema or tenderness.  Entire spine She has pain at right-sided paralumbar and parathoracic area upon sitting up from a supine position.  Neurological: She is alert. She has normal reflexes. No cranial nerve deficit. Coordination normal.  DTRs symmetric bilaterally at knee jerk and ankle jerk and biceps toes downgoing bilaterally motor strength 5 over 5 overall she walks with slight limp, unassisted without difficulty  Skin: Skin is warm and dry. No rash noted.  Psychiatric: She has a normal mood and affect.  Nursing note and vitals reviewed.   ED Course  Procedures (including critical care time) Labs Review Labs Reviewed - No data to display  Imaging Review No results found. I have personally reviewed and evaluated these images and lab results as part of my medical decision-making.   EKG Interpretation None     1520 p.m. patient is improved and ready to go home after treatment with oral Valium Results for orders placed or performed in visit on 03/24/15  CBC with Differential  Result Value Ref Range   WBC 5.1 3.9 - 10.3 10e3/uL   NEUT# 2.7 1.5 - 6.5 10e3/uL   HGB 13.8 11.6 - 15.9 g/dL   HCT 39.5 34.8 - 46.6 %   Platelets 194 145 - 400 10e3/uL   MCV 86.2 79.5 - 101.0 fL   MCH 30.1 25.1 - 34.0 pg   MCHC 34.9 31.5 - 36.0 g/dL   RBC 4.58 3.70 - 5.45 10e6/uL   RDW 13.2 11.2 - 14.5 %   lymph# 1.5 0.9 - 3.3 10e3/uL   MONO# 0.6 0.1 - 0.9 10e3/uL   Eosinophils Absolute 0.3 0.0 - 0.5 10e3/uL   Basophils Absolute 0.0 0.0 - 0.1 10e3/uL   NEUT% 52.8 38.4 - 76.8 %   LYMPH% 28.5 14.0 - 49.7 %   MONO% 12.2 0.0 - 14.0 %    EOS% 5.9 0.0 - 7.0 %   BASO% 0.6 0.0 - 2.0 %  Comprehensive metabolic panel (Cmet) - [facility]  Result Value Ref Range   Sodium 141 136 - 145 mEq/L   Potassium 4.0 3.5 - 5.1 mEq/L   Chloride 109 98 - 109 mEq/L   CO2 24 22 - 29 mEq/L   Glucose 104 70 - 140 mg/dl   BUN 21.7 7.0 - 26.0 mg/dL   Creatinine 1.0 0.6 - 1.1 mg/dL   Total Bilirubin 0.94 0.20 - 1.20 mg/dL   Alkaline Phosphatase 88 40 - 150 U/L   AST 27 5 - 34 U/L   ALT 25 0 - 55 U/L   Total Protein 7.1 6.4 - 8.3 g/dL   Albumin 3.9 3.5 - 5.0  g/dL   Calcium 11.7 (H) 8.4 - 10.4 mg/dL   Anion Gap 8 3 - 11 mEq/L   EGFR 57 (L) >90 ml/min/1.73 m2  CA 27.29  Result Value Ref Range   CA 27.29 262 (H) 0 - 39 U/mL   Dg Thoracic Spine W/swimmers  07/26/2015  CLINICAL DATA:  Dorsalgia for 6 months. History of breast carcinoma. EXAM: THORACIC SPINE - 3 VIEWS COMPARISON:  Chest radiograph April 27, 2015 FINDINGS: Frontal, lateral, and swimmer's views were obtained. There is anterior wedging of the T11 vertebral body which appears slightly more pronounced compared to study from 3 months prior. No other fracture is seen. There is no spondylolisthesis. There is multilevel osteoarthritic change. There is no paraspinous lesion or erosion. IMPRESSION: Wedge compression fracture of the T11 vertebral body which appears slightly more pronounced than on study from 3 months prior. The patient has a history of breast carcinoma. Given this slight increase in wedging in this area and persistent pain, nonemergent thoracic MR to assess for possible neoplastic involvement of the T11 vertebral body may be reasonable. There is no other fracture. No spondylolisthesis. There is multilevel osteoarthritic change. No paraspinous lesion. Electronically Signed   By: Lowella Grip III M.D.   On: 07/26/2015 14:36   Dg Lumbar Spine Complete  07/26/2015  CLINICAL DATA:  Mid and lower back pain since an injury in July (lifting a ladder). No radiation into the legs. EXAM:  LUMBAR SPINE - COMPLETE 4+ VIEW COMPARISON:  04/27/2015 FINDINGS: There are small ribs at T12 followed by 4 non rib-bearing lumbar type vertebral bodies. L5 is transitional with a right-sided assimilation joint with the sacrum. Mild lumbar dextroscoliosis is unchanged. There is at most trace retrolisthesis of L3 on L4 and at most trace anterolisthesis of L4 on L5, unchanged. Lumbar vertebral body heights are preserved without evidence of compression fracture. Moderate disc space narrowing is again seen at L4-5 greater than L5-S1, with mild narrowing at L3-4. No pars defects are identified. There is mild-to-moderate lower lumbar facet arthrosis. Small calcifications in the pelvis likely represent phleboliths. Moderate amount of colonic stool is partially visualized. 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 Electronically Signed   By: Logan Bores M.D.   On: 07/26/2015 14:37    MDM  . I spoke with Dr. Buel Ream office who will arrange to get patient in for office visit and arrange for outpatient MRI of thoracic spine Final diagnoses:  None   Plan prescription Valium. Tylenol for pain. Diagnosis #1 thoracic compression fracture #2 back pain     Orlie Dakin, MD 07/26/15 1530

## 2015-07-26 NOTE — ED Notes (Signed)
Pt states back injury in June ln 2016. Seen PCP and PT for several months with some relief. Today was unable to get OOB. Seen [facility]. MD Clarisa Fling. Referral to Chiropractor. Appt. Today at 4pm. Had to cancel. Denies loss of bowel or bladder or numbness or tingling.

## 2015-07-26 NOTE — Discharge Instructions (Signed)
Call Dr. Shon Baton office tomorrow to schedule office visit. The office will schedule an MRI of your spine to make sure that cancer has not spread to bone. Take Tylenol every 4 hours as directed for pain. Take the medication prescribed for spasm of your back. Take your blood pressure medication when you arrive home today. Today's blood pressure was elevated at 186/101. Get your blood pressure rechecked at your doctor's office within the next 1-2 weeks.

## 2015-07-26 NOTE — ED Notes (Signed)
Patient transported to X-ray 

## 2015-07-26 NOTE — ED Notes (Signed)
Reviewed new D/c instructions per EDP request. Pt and family understood. RX given. Pt transported by wheelchiar

## 2015-07-26 NOTE — ED Notes (Addendum)
Informed by 911 to not eat or drink anything. Pt did not take AM medications. (BP MEDICATIONS)

## 2015-07-27 NOTE — ED Provider Notes (Signed)
Pt called asking about her prescription from the visit yesterday.  Pt thought it was going to be called in.  I reviewed Dr Joanie Coddington note.  Pt should have received a valium rx.  I will write another rx (same as Dr Joanie Coddington )for her to pick up in the ED.  Dorie Rank, MD 07/27/15 1032

## 2015-08-02 ENCOUNTER — Inpatient Hospital Stay (HOSPITAL_COMMUNITY)
Admission: EM | Admit: 2015-08-02 | Discharge: 2015-08-07 | DRG: 872 | Disposition: A | Discharge status: Skilled Nursing Facility | Payer: Medicare Other | Attending: Internal Medicine | Admitting: Internal Medicine

## 2015-08-02 ENCOUNTER — Emergency Department (HOSPITAL_COMMUNITY): Payer: Medicare Other

## 2015-08-02 ENCOUNTER — Encounter (HOSPITAL_COMMUNITY): Payer: Self-pay | Admitting: Emergency Medicine

## 2015-08-02 DIAGNOSIS — K6289 Other specified diseases of anus and rectum: Secondary | ICD-10-CM | POA: Diagnosis present

## 2015-08-02 DIAGNOSIS — R1030 Lower abdominal pain, unspecified: Secondary | ICD-10-CM

## 2015-08-02 DIAGNOSIS — Z87891 Personal history of nicotine dependence: Secondary | ICD-10-CM | POA: Diagnosis not present

## 2015-08-02 DIAGNOSIS — N179 Acute kidney failure, unspecified: Secondary | ICD-10-CM | POA: Diagnosis present

## 2015-08-02 DIAGNOSIS — Z8249 Family history of ischemic heart disease and other diseases of the circulatory system: Secondary | ICD-10-CM

## 2015-08-02 DIAGNOSIS — Z90710 Acquired absence of both cervix and uterus: Secondary | ICD-10-CM | POA: Diagnosis not present

## 2015-08-02 DIAGNOSIS — M4854XA Collapsed vertebra, not elsewhere classified, thoracic region, initial encounter for fracture: Secondary | ICD-10-CM | POA: Diagnosis present

## 2015-08-02 DIAGNOSIS — C7951 Secondary malignant neoplasm of bone: Secondary | ICD-10-CM | POA: Insufficient documentation

## 2015-08-02 DIAGNOSIS — G8929 Other chronic pain: Secondary | ICD-10-CM | POA: Diagnosis present

## 2015-08-02 DIAGNOSIS — E860 Dehydration: Secondary | ICD-10-CM | POA: Diagnosis present

## 2015-08-02 DIAGNOSIS — A09 Infectious gastroenteritis and colitis, unspecified: Secondary | ICD-10-CM | POA: Diagnosis not present

## 2015-08-02 DIAGNOSIS — E785 Hyperlipidemia, unspecified: Secondary | ICD-10-CM | POA: Diagnosis present

## 2015-08-02 DIAGNOSIS — A419 Sepsis, unspecified organism: Principal | ICD-10-CM | POA: Diagnosis present

## 2015-08-02 DIAGNOSIS — Z9010 Acquired absence of unspecified breast and nipple: Secondary | ICD-10-CM | POA: Diagnosis not present

## 2015-08-02 DIAGNOSIS — A047 Enterocolitis due to Clostridium difficile: Secondary | ICD-10-CM | POA: Diagnosis present

## 2015-08-02 DIAGNOSIS — Z66 Do not resuscitate: Secondary | ICD-10-CM | POA: Diagnosis present

## 2015-08-02 DIAGNOSIS — Z853 Personal history of malignant neoplasm of breast: Secondary | ICD-10-CM | POA: Diagnosis not present

## 2015-08-02 DIAGNOSIS — I10 Essential (primary) hypertension: Secondary | ICD-10-CM | POA: Diagnosis present

## 2015-08-02 DIAGNOSIS — C782 Secondary malignant neoplasm of pleura: Secondary | ICD-10-CM

## 2015-08-02 DIAGNOSIS — C50919 Malignant neoplasm of unspecified site of unspecified female breast: Secondary | ICD-10-CM | POA: Diagnosis present

## 2015-08-02 DIAGNOSIS — R197 Diarrhea, unspecified: Secondary | ICD-10-CM | POA: Diagnosis present

## 2015-08-02 DIAGNOSIS — E876 Hypokalemia: Secondary | ICD-10-CM | POA: Diagnosis present

## 2015-08-02 DIAGNOSIS — R531 Weakness: Secondary | ICD-10-CM | POA: Diagnosis not present

## 2015-08-02 DIAGNOSIS — I959 Hypotension, unspecified: Secondary | ICD-10-CM

## 2015-08-02 DIAGNOSIS — A0472 Enterocolitis due to Clostridium difficile, not specified as recurrent: Secondary | ICD-10-CM | POA: Diagnosis present

## 2015-08-02 HISTORY — DX: Essential (primary) hypertension: I10

## 2015-08-02 LAB — COMPREHENSIVE METABOLIC PANEL
ALK PHOS: 104 U/L (ref 38–126)
ALT: 42 U/L (ref 14–54)
AST: 61 U/L — ABNORMAL HIGH (ref 15–41)
Albumin: 3.2 g/dL — ABNORMAL LOW (ref 3.5–5.0)
Anion gap: 9 (ref 5–15)
BUN: 61 mg/dL — ABNORMAL HIGH (ref 6–20)
CALCIUM: 14 mg/dL — AB (ref 8.9–10.3)
CO2: 23 mmol/L (ref 22–32)
CREATININE: 2.1 mg/dL — AB (ref 0.44–1.00)
Chloride: 104 mmol/L (ref 101–111)
GFR, EST AFRICAN AMERICAN: 26 mL/min — AB (ref >60)
GFR, EST NON AFRICAN AMERICAN: 22 mL/min — AB (ref >60)
Glucose, Bld: 145 mg/dL — ABNORMAL HIGH (ref 65–99)
Potassium: 3.9 mmol/L (ref 3.5–5.1)
Sodium: 136 mmol/L (ref 135–145)
Total Bilirubin: 0.7 mg/dL (ref 0.3–1.2)
Total Protein: 6.5 g/dL (ref 6.5–8.1)

## 2015-08-02 LAB — CBC WITH DIFFERENTIAL/PLATELET
Basophils Absolute: 0 10*3/uL (ref 0.0–0.1)
Basophils Relative: 0 %
Eosinophils Absolute: 0 10*3/uL (ref 0.0–0.7)
Eosinophils Relative: 0 %
HCT: 40.3 % (ref 36.0–46.0)
HEMOGLOBIN: 13.7 g/dL (ref 12.0–15.0)
LYMPHS ABS: 1.6 10*3/uL (ref 0.7–4.0)
LYMPHS PCT: 8 %
MCH: 29.6 pg (ref 26.0–34.0)
MCHC: 34 g/dL (ref 30.0–36.0)
MCV: 87 fL (ref 78.0–100.0)
Monocytes Absolute: 2.4 10*3/uL — ABNORMAL HIGH (ref 0.1–1.0)
Monocytes Relative: 12 %
NEUTROS PCT: 80 %
Neutro Abs: 15.3 10*3/uL — ABNORMAL HIGH (ref 1.7–7.7)
Platelets: 253 10*3/uL (ref 150–400)
RBC: 4.63 MIL/uL (ref 3.87–5.11)
RDW: 13.2 % (ref 11.5–15.5)
WBC: 19.3 10*3/uL — AB (ref 4.0–10.5)

## 2015-08-02 LAB — I-STAT CG4 LACTIC ACID, ED
LACTIC ACID, VENOUS: 2.37 mmol/L — AB (ref 0.5–2.0)
LACTIC ACID, VENOUS: 2.38 mmol/L — AB (ref 0.5–2.0)

## 2015-08-02 LAB — URINALYSIS, ROUTINE W REFLEX MICROSCOPIC
BILIRUBIN URINE: NEGATIVE
GLUCOSE, UA: NEGATIVE mg/dL
HGB URINE DIPSTICK: NEGATIVE
Ketones, ur: NEGATIVE mg/dL
Leukocytes, UA: NEGATIVE
Nitrite: NEGATIVE
Protein, ur: NEGATIVE mg/dL
SPECIFIC GRAVITY, URINE: 1.017 (ref 1.005–1.030)
pH: 5.5 (ref 5.0–8.0)

## 2015-08-02 LAB — MAGNESIUM: MAGNESIUM: 2.1 mg/dL (ref 1.7–2.4)

## 2015-08-02 LAB — PROTIME-INR
INR: 1.07 (ref 0.00–1.49)
PROTHROMBIN TIME: 14.1 s (ref 11.6–15.2)

## 2015-08-02 LAB — PHOSPHORUS: Phosphorus: 3.3 mg/dL (ref 2.5–4.6)

## 2015-08-02 LAB — PROCALCITONIN: Procalcitonin: 0.77 ng/mL

## 2015-08-02 LAB — LIPASE, BLOOD: LIPASE: 32 U/L (ref 11–51)

## 2015-08-02 LAB — CK: Total CK: 513 U/L — ABNORMAL HIGH (ref 38–234)

## 2015-08-02 LAB — APTT: aPTT: 23 seconds — ABNORMAL LOW (ref 24–37)

## 2015-08-02 MED ORDER — ADULT MULTIVITAMIN W/MINERALS CH
1.0000 | ORAL_TABLET | Freq: Every day | ORAL | Status: DC
  Administered 2015-08-03 – 2015-08-07 (×5): 1 via ORAL
  Filled 2015-08-02 (×6): qty 1

## 2015-08-02 MED ORDER — SODIUM CHLORIDE 0.9 % IJ SOLN
3.0000 mL | Freq: Two times a day (BID) | INTRAMUSCULAR | Status: DC
  Administered 2015-08-03 – 2015-08-06 (×7): 3 mL via INTRAVENOUS

## 2015-08-02 MED ORDER — SODIUM CHLORIDE 0.9 % IV SOLN
INTRAVENOUS | Status: DC
  Administered 2015-08-03: 02:00:00 via INTRAVENOUS

## 2015-08-02 MED ORDER — SODIUM CHLORIDE 0.9 % IV BOLUS (SEPSIS)
2000.0000 mL | Freq: Once | INTRAVENOUS | Status: AC
  Administered 2015-08-03: 2000 mL via INTRAVENOUS

## 2015-08-02 MED ORDER — METRONIDAZOLE IN NACL 5-0.79 MG/ML-% IV SOLN
500.0000 mg | Freq: Three times a day (TID) | INTRAVENOUS | Status: DC
  Administered 2015-08-03 (×2): 500 mg via INTRAVENOUS
  Filled 2015-08-02 (×3): qty 100

## 2015-08-02 MED ORDER — SIMVASTATIN 20 MG PO TABS
20.0000 mg | ORAL_TABLET | Freq: Every day | ORAL | Status: DC
  Administered 2015-08-03 – 2015-08-06 (×5): 20 mg via ORAL
  Filled 2015-08-02 (×5): qty 1

## 2015-08-02 MED ORDER — LORATADINE 10 MG PO TABS
10.0000 mg | ORAL_TABLET | Freq: Every day | ORAL | Status: DC
  Administered 2015-08-04 – 2015-08-07 (×4): 10 mg via ORAL
  Filled 2015-08-02 (×5): qty 1

## 2015-08-02 MED ORDER — CIPROFLOXACIN IN D5W 400 MG/200ML IV SOLN
400.0000 mg | INTRAVENOUS | Status: DC
  Administered 2015-08-03: 400 mg via INTRAVENOUS
  Filled 2015-08-02: qty 200

## 2015-08-02 MED ORDER — VITAMIN E 45 MG (100 UNIT) PO CAPS
200.0000 [IU] | ORAL_CAPSULE | Freq: Every day | ORAL | Status: DC
  Administered 2015-08-03 – 2015-08-07 (×5): 200 [IU] via ORAL
  Filled 2015-08-02 (×5): qty 2

## 2015-08-02 MED ORDER — VITAMIN B-12 1000 MCG PO TABS
1000.0000 ug | ORAL_TABLET | Freq: Two times a day (BID) | ORAL | Status: DC
Start: 2015-08-02 — End: 2015-08-07
  Administered 2015-08-03 – 2015-08-07 (×10): 1000 ug via ORAL
  Filled 2015-08-02 (×10): qty 1

## 2015-08-02 MED ORDER — SODIUM CHLORIDE 0.45 % IV SOLN: INTRAVENOUS | Status: DC

## 2015-08-02 MED ORDER — SODIUM CHLORIDE 0.9 % IV SOLN: Freq: Once | INTRAVENOUS | Status: DC

## 2015-08-02 MED ORDER — ONDANSETRON HCL 4 MG/2ML IJ SOLN: 4.0000 mg | Freq: Three times a day (TID) | INTRAMUSCULAR | Status: DC | PRN

## 2015-08-02 MED ORDER — LETROZOLE 2.5 MG PO TABS
2.5000 mg | ORAL_TABLET | Freq: Every day | ORAL | Status: DC
  Administered 2015-08-03 – 2015-08-07 (×5): 2.5 mg via ORAL
  Filled 2015-08-02 (×6): qty 1

## 2015-08-02 MED ORDER — VITAMIN C 500 MG PO TABS
500.0000 mg | ORAL_TABLET | Freq: Every day | ORAL | Status: DC
  Administered 2015-08-03 – 2015-08-07 (×5): 500 mg via ORAL
  Filled 2015-08-02 (×5): qty 1

## 2015-08-02 MED ORDER — MORPHINE SULFATE (PF) 2 MG/ML IV SOLN
2.0000 mg | INTRAVENOUS | Status: DC | PRN
  Administered 2015-08-04: 2 mg via INTRAVENOUS
  Filled 2015-08-02: qty 1

## 2015-08-02 MED ORDER — EPINEPHRINE 0.3 MG/0.3ML IJ SOAJ: 0.3000 mg | Freq: Every day | INTRAMUSCULAR | Status: DC | PRN

## 2015-08-02 MED ORDER — HEPARIN SODIUM (PORCINE) 5000 UNIT/ML IJ SOLN
5000.0000 [IU] | Freq: Three times a day (TID) | INTRAMUSCULAR | Status: DC
  Administered 2015-08-03 – 2015-08-07 (×13): 5000 [IU] via SUBCUTANEOUS
  Filled 2015-08-02 (×15): qty 1

## 2015-08-02 MED ORDER — SODIUM CHLORIDE 0.9 % IV BOLUS (SEPSIS)
500.0000 mL | Freq: Once | INTRAVENOUS | Status: AC
  Administered 2015-08-02: 500 mL via INTRAVENOUS

## 2015-08-02 MED ORDER — FERROUS SULFATE 325 (65 FE) MG PO TABS
325.0000 mg | ORAL_TABLET | ORAL | Status: DC
  Administered 2015-08-03 – 2015-08-07 (×2): 325 mg via ORAL
  Filled 2015-08-02 (×2): qty 1

## 2015-08-02 MED ORDER — DIAZEPAM 2 MG PO TABS: 2.0000 mg | ORAL_TABLET | Freq: Four times a day (QID) | ORAL | Status: DC | PRN

## 2015-08-02 NOTE — ED Notes (Signed)
Bed: WA04 Expected date:  Expected time:  Means of arrival:  Comments: EMS hypotensive

## 2015-08-02 NOTE — ED Provider Notes (Signed)
CSN: [id number]     Arrival date & time 08/02/15  1629 History   First MD Initiated Contact with Patient 08/02/15 1632     Chief Complaint  Patient presents with  . Hypotension  . Diarrhea  . Fall     (Consider location/radiation/quality/duration/timing/severity/associated sxs/prior Treatment) HPI Comments: 75 year old female with history of breast cancer currently on Femara daily, hypertension, hyperlipidemia presents for weakness. The patient was seen here about one week ago for back pain. She states that since July she has had pain in her back that recently it has been getting more intense. She lives with her 23 year old mother who is her caregiver knows had to help to get her up recently because the pain is worse and she feels generally weak. The patient denies numbness or tingling or loss of sensation in the legs. Reports normal bowel bladder habits. It was seen on x-ray last week that she has a compression fracture. She was discharged with medications to help with this. Last night the patient apparently got up to use the bathroom in the middle the night and fell to the floor. Her mother was not able to help her up and wanted to call 911 but the patient told her not to and slept on the floor from 2 AM until 7 AM when she let her mom called 911. She reports feeling very dehydrated. Per EMS she was hypotensive on their arrival and was given IV fluids with improvement.  Patient denies fevers, chills, nausea, vomiting. Denies headache.   Past Medical History  Diagnosis Date  . Pleural effusion 11-08-2008  . Breast cancer ([place]) 07-13-1997  . Chronic back pain   . Hypertension    Past Surgical History  Procedure Laterality Date  . Abdominal hysterectomy  02-21-1982  . Pelvic laparoscopy  11-06-1981  . Dilation and curettage, diagnostic / therapeutic  12-13-1981  . Breast biopsy  07-13-1997  . Mastectomy  07-29-1997  . Thoracentesis  10-18-2008   History reviewed. No pertinent family  history. Social History  Substance Use Topics  . Smoking status: Former Smoker    Quit date: 07/22/1996  . Smokeless tobacco: Never Used  . Alcohol Use: No   OB History    No data available     Review of Systems  Constitutional: Positive for activity change and fatigue. Negative for fever.  HENT: Negative for congestion, postnasal drip and rhinorrhea.   Eyes: Negative for visual disturbance.  Respiratory: Negative for cough, chest tightness and shortness of breath.   Cardiovascular: Negative for chest pain and palpitations.  Gastrointestinal: Negative for nausea, abdominal pain and diarrhea.  Genitourinary: Negative for dysuria, urgency and hematuria.  Musculoskeletal: Positive for back pain. Negative for myalgias.  Skin: Negative for rash and wound.  Neurological: Positive for weakness (generalized). Negative for dizziness, numbness and headaches.  Hematological: Does not bruise/bleed easily.      Allergies  Diphenhydramine hcl; Oseltamivir phosphate; Oxycodone-acetaminophen; Prednisone; Sudafed; and Codeine sulfate  Home Medications   Prior to Admission medications   Medication Sig Start Date End Date Taking? Authorizing Provider  Calcium-Vitamin D (CALTRATE 600 PLUS-VIT D PO) Take 1 tablet by mouth daily.    Yes Historical Provider, MD  diazepam (VALIUM) 2 MG tablet Take 1 tablet (2 mg total) by mouth every 6 (six) hours as needed for anxiety or muscle spasms. 07/26/15  Yes Sam Winfred Leeds, MD  EPIPEN 2-PAK 0.3 MG/0.3ML SOAJ injection Inject 0.3 mg into the skin daily as needed (allergic reaction).  10/17/14  Yes Historical  Provider, MD  ferrous sulfate 325 (65 FE) MG tablet Take 325 mg by mouth 2 (two) times a week. Wednesday and Saturdays, causes constipation so only takes it twice weekly   Yes Historical Provider, MD  letrozole (FEMARA) 2.5 MG tablet Take 1 tablet (2.5 mg total) by mouth daily. 05/23/15  Yes Lennis Marion Downer, MD  loratadine (CLARITIN) 10 MG tablet Take 10 mg  by mouth daily.    Yes Historical Provider, MD  losartan (COZAAR) 50 MG tablet Take 50 mg by mouth daily.     Yes Historical Provider, MD  Multiple Vitamins-Minerals (MULTIVITAMIN WITH MINERALS) tablet Take 1 tablet by mouth daily.     Yes Historical Provider, MD  simvastatin (ZOCOR) 20 MG tablet Take 20 mg by mouth at bedtime.     Yes Historical Provider, MD  vitamin B-12 (CYANOCOBALAMIN) 1000 MCG tablet Take 1,000 mcg by mouth 2 (two) times daily.   Yes Historical Provider, MD  vitamin C (ASCORBIC ACID) 500 MG tablet Take 500 mg by mouth daily.     Yes Historical Provider, MD  vitamin E 200 UNIT capsule Take 200 Units by mouth daily.     Yes Historical Provider, MD   BP 145/75 mmHg  Pulse 85  Temp(Src) 99.3 F (37.4 C) (Oral)  Resp 23  SpO2 97% Physical Exam  Constitutional: She is oriented to person, place, and time. She appears well-developed and well-nourished. No distress.  HENT:  Head: Normocephalic and atraumatic.  Right Ear: External ear normal.  Left Ear: External ear normal.  Nose: Nose normal.  Mouth/Throat: Oropharynx is clear and moist. No oropharyngeal exudate.  Eyes: EOM are normal. Pupils are equal, round, and reactive to light.  Neck: Normal range of motion. Neck supple.  Cardiovascular: Normal rate, regular rhythm, normal heart sounds and intact distal pulses.   No murmur heard. Pulmonary/Chest: Effort normal. No respiratory distress. She has no wheezes. She has no rales.  Abdominal: Soft. She exhibits no distension. There is tenderness (bilateral flanks and right mid abdomen).  Musculoskeletal: She exhibits no edema.       Thoracic back: She exhibits decreased range of motion, tenderness and bony tenderness.       Lumbar back: She exhibits tenderness.  Neurological: She is alert and oriented to person, place, and time. No sensory deficit.  Patient able to plantar flex and left legs without difficulty. Denies any loss of sensation in the bilateral legs. No saddle  anesthesia.  Symmetric strength in the bilateral lower extremities as well as in the upper extremities.  Skin: Skin is warm and dry. No rash noted. She is not diaphoretic.  Vitals reviewed.   ED Course  Procedures (including critical care time) Labs Review Labs Reviewed  CBC WITH DIFFERENTIAL/PLATELET - Abnormal; Notable for the following:    WBC 19.3 (*)    Neutro Abs 15.3 (*)    Monocytes Absolute 2.4 (*)    All other components within normal limits  COMPREHENSIVE METABOLIC PANEL - Abnormal; Notable for the following:    Glucose, Bld 145 (*)    BUN 61 (*)    Creatinine, Ser 2.10 (*)    Calcium 14.0 (*)    Albumin 3.2 (*)    AST 61 (*)    GFR calc non Af Amer 22 (*)    GFR calc Af Amer 26 (*)    All other components within normal limits  URINALYSIS, ROUTINE W REFLEX MICROSCOPIC (NOT AT [name]) - Abnormal; Notable for the following:  APPearance CLOUDY (*)    All other components within normal limits  CK - Abnormal; Notable for the following:    Total CK 513 (*)    All other components within normal limits  I-STAT CG4 LACTIC ACID, ED - Abnormal; Notable for the following:    Lactic Acid, Venous 2.37 (*)    All other components within normal limits  LIPASE, BLOOD  I-STAT CG4 LACTIC ACID, ED    Imaging Review Ct Abdomen Pelvis Wo Contrast  08/02/2015  CLINICAL DATA:  Diarrhea, hypotension. EXAM: CT ABDOMEN AND PELVIS WITHOUT CONTRAST TECHNIQUE: Multidetector CT imaging of the abdomen and pelvis was performed following the standard protocol without IV contrast. COMPARISON:  CT scan of June 29, 2014. FINDINGS: Pathologic fracture of T10 vertebral body is noted. Lytic metastatic lesions are noted in the T9 and T11 vertebral bodies. Lytic metastatic lesion is noted in right iliac wing as well. Stable chronic pleural thickening is noted in right lung base posteriorly consistent with prior pleurodesis. No gallstones are noted. No focal abnormality is noted the liver, spleen or  pancreas on these unenhanced images. Adrenal glands are unremarkable. Nonobstructive right nephrolithiasis is noted. No hydronephrosis or renal obstruction is noted. No ureteral calculi are noted. The appendix appears normal. There is no evidence of bowel obstruction. Atherosclerosis of abdominal aorta is noted without aneurysm formation. No abnormal fluid collection is noted. Status post hysterectomy. Urinary bladder is decompressed. Ovaries are unremarkable. No significant adenopathy is noted. Large amount of stool is noted in the rectum concerning for impaction. There is noted mild diffuse wall thickening of the rectum concerning for proctitis. Inflammatory changes are noted in the surrounding tissues. IMPRESSION: Pathologic compression fracture of T10 vertebral body is noted. Lytic metastatic lesions are also noted in the T9 and T11 vertebral bodies, as well as the right iliac wing. Nonobstructive right nephrolithiasis. No hydronephrosis or renal obstruction is noted. Mild diffuse wall thickening of the rectum is noted with surrounding inflammatory changes concerning for proctitis. Large amount of stool is noted in the rectum concerning for impaction. Electronically Signed   By: Marijo Conception, M.D.   On: 08/02/2015 19:53   Dg Chest 2 View  08/02/2015  CLINICAL DATA:  Weakness and low back pain.  Fell last night. EXAM: CHEST  2 VIEW COMPARISON:  04/27/2015 FINDINGS: Heart size is normal. There is atherosclerosis of the aorta. There is been previous surgery in the right chest wall and axillary region. There is chronic pulmonary scarring but no sign of active infiltrate, mass, or infiltrate. I think there is mild basilar atelectasis, particularly at the right base. Old lower thoracic compression fractures again noted. This may have progressed slightly. IMPRESSION: Chronic pulmonary scarring. Mild atelectasis at the lung bases, right more than left. Old lower thoracic compression fracture which appears to have  progressed slightly. Electronically Signed   By: Nelson Chimes M.D.   On: 08/02/2015 17:42   I have personally reviewed and evaluated these images and lab results as part of my medical decision-making.   EKG Interpretation   Date/Time:  Wednesday August 02 2015 16:45:44 EST Ventricular Rate:  80 PR Interval:  150 QRS Duration: 97 QT Interval:  346 QTC Calculation: 399 R Axis:   -29 Text Interpretation:  Sinus rhythm Atrial premature complex Left  ventricular hypertrophy Anterior Q waves, possibly due to LVH Baseline  wander in lead(s) V1 No significant change since last tracing Confirmed by  NGUYEN, EMILY ([id number]) on 08/02/2015 4:54:19 PM  MDM  Patient was seen and evaluated in stable condition. Blood pressure appears to have improved after IV fluids. Results showed leukocytosis as well as an elevated calcium. Patient also with acute kidney injury. CT showed new lytic lesions to the thoracic spine as well as right iliac wing. Patient was hydrated with IV fluids.  Case was discussed with Dr. Blaine Hamper who agreed with admission and the patient was admitted under his care for continued treatment. Final diagnoses:  Lower abdominal pain  Hypotension    1. Acute kidney injury  2. Hypotension  3. Lower abdominal pain    Harvel Quale, MD 08/04/15 0201

## 2015-08-02 NOTE — ED Notes (Signed)
CN drawing labs

## 2015-08-02 NOTE — ED Notes (Signed)
Notified edp results from istat lactic acid.

## 2015-08-02 NOTE — H&P (Signed)
[facility] History and Physical  Lisa [name] RKY:[id number] DOB: [birth year] DOA: 08/02/2015  Referring physician: ED physician PCP: [name], MD  Specialists:   Chief Complaint: Diarrhea, weakness and worsening back pain  HPI: Lisa [name] is a 75 y.o. female with PMH of metastatic breast cancer (S/p mastectomy), chronic back pain, hypertension, hyperlipidemia, who presents recent diarrhea and weakness.  Patient reports that she has been having diarrhea and the generalized weakness in the past 4 days. She has had 5 bowel movement with loose stool today. He has generalized weakness, but no unilateral weakness or tingling sensations. She does not have nausea, vomiting, fever or chills. She reports that she has chronic back pain, which radiated to both side of her abdomen sometimes. Patient reports that her chronic back pain has worsened recently. She does not have symptoms of UTI, cough, shortness of breath, chest pain. She reports that she was so weak, and had fall last night without head injury.  In ED, patient was found to have lactate 2.37, negative urinalysis, acute renal injury with creatinine 2.10, BUN 61, calcium 14.0, CK 513, WBC 19.3, temperature 99.3, no tachycardia, RR 23, oxygen saturation 87 on room air, which improved to 98 on 2 L nasal cannula. CXR showed chronic pulmonary scarring, old lower thoracic compression fracture which appears to have progressed slightly. CT-abdomen/pelvis that showed possible proctitis, no hydronephrosis, pathologic compression fracture of T10 vertebral body, lytic metastatic lesions are also noted in the T9 and T11 vertebral bodies, as well as the right iliac wing obstruction. Patient admitted to inpatient for further eval and treatment.  EKG: Independently reviewed. QTC 399, PAC, nonspecific T-wave change.  Where does patient live?   At home Can patient participate in ADLs?  Barely   Review of Systems:   General: no fevers,  chills, no changes in body weight, has poor appetite, has fatigue HEENT: no blurry vision, hearing changes or sore throat Pulm: no dyspnea, coughing, wheezing CV: no chest pain, palpitations Abd: no nausea, vomiting, abdominal pain, has diarrhea, no constipation GU: no dysuria, burning on urination, increased urinary frequency, hematuria  Ext: no leg edema Neuro: no unilateral weakness, numbness, or tingling, no vision change or hearing loss Skin: no rash MSK: No muscle spasm, no deformity, no limitation of range of movement in spin. Has lower back pain. Heme: No easy bruising.  Travel history: No recent long distant travel.  Allergy:  Allergies  Allergen Reactions  . Diphenhydramine Hcl Other (See Comments)    Severe headaches  . Oseltamivir Phosphate Nausea And Vomiting    REACTION: rash  . Oxycodone-Acetaminophen Nausea And Vomiting    REACTION: emesis  . Prednisone Other (See Comments)    REACTION: hiatal hernia, runny  Nose, felt terrible  . Sudafed [Pseudoephedrine Hcl]     vertigo  . Codeine Sulfate Rash    REACTION: rash    Past Medical History  Diagnosis Date  . Pleural effusion 11-08-2008  . Breast cancer ([place]) 07-13-1997  . Chronic back pain   . Hypertension     Past Surgical History  Procedure Laterality Date  . Abdominal hysterectomy  02-21-1982  . Pelvic laparoscopy  11-06-1981  . Dilation and curettage, diagnostic / therapeutic  12-13-1981  . Breast biopsy  07-13-1997  . Mastectomy  07-29-1997  . Thoracentesis  10-18-2008    Social History:  reports that she quit smoking about 19 years ago. She has never used smokeless tobacco. She reports that she does not drink alcohol or use  illicit drugs.  Family History:  Family History  Problem Relation Age of Onset  . Heart disease Mother     with pacemaker   . Asthma Father      Prior to Admission medications   Medication Sig Start Date End Date Taking? Authorizing Provider  Calcium-Vitamin D (CALTRATE 600  PLUS-VIT D PO) Take 1 tablet by mouth daily.    Yes Historical Provider, MD  diazepam (VALIUM) 2 MG tablet Take 1 tablet (2 mg total) by mouth every 6 (six) hours as needed for anxiety or muscle spasms. 07/26/15  Yes Sam Winfred Leeds, MD  EPIPEN 2-PAK 0.3 MG/0.3ML SOAJ injection Inject 0.3 mg into the skin daily as needed (allergic reaction).  10/17/14  Yes Historical Provider, MD  ferrous sulfate 325 (65 FE) MG tablet Take 325 mg by mouth 2 (two) times a week. Wednesday and Saturdays, causes constipation so only takes it twice weekly   Yes Historical Provider, MD  letrozole (FEMARA) 2.5 MG tablet Take 1 tablet (2.5 mg total) by mouth daily. 05/23/15  Yes Lennis Marion Downer, MD  loratadine (CLARITIN) 10 MG tablet Take 10 mg by mouth daily.    Yes Historical Provider, MD  losartan (COZAAR) 50 MG tablet Take 50 mg by mouth daily.     Yes Historical Provider, MD  Multiple Vitamins-Minerals (MULTIVITAMIN WITH MINERALS) tablet Take 1 tablet by mouth daily.     Yes Historical Provider, MD  simvastatin (ZOCOR) 20 MG tablet Take 20 mg by mouth at bedtime.     Yes Historical Provider, MD  vitamin B-12 (CYANOCOBALAMIN) 1000 MCG tablet Take 1,000 mcg by mouth 2 (two) times daily.   Yes Historical Provider, MD  vitamin C (ASCORBIC ACID) 500 MG tablet Take 500 mg by mouth daily.     Yes Historical Provider, MD  vitamin E 200 UNIT capsule Take 200 Units by mouth daily.     Yes Historical Provider, MD    Physical Exam: Filed Vitals:   08/02/15 1951 08/02/15 2000 08/02/15 2034 08/02/15 2100  BP: 145/75 140/67 125/67 137/71  Pulse: 85 83 78 81  Temp: 99.3 F (37.4 C)     TempSrc: Oral     Resp: 23 22 23 24   SpO2: 97% 96% 98% 99%   General: Not in acute distress HEENT:       Eyes: PERRL, EOMI, no scleral icterus.       ENT: No discharge from the ears and nose, no pharynx injection, no tonsillar enlargement.        Neck: No JVD, no bruit, no mass felt. Heme: No neck lymph node enlargement. Cardiac: S1/S2, RRR,  No murmurs, No gallops or rubs. Pulm: No rales, wheezing, rhonchi or rubs. Abd: Soft, nondistended, nontender, no rebound pain, no organomegaly, BS present. Ext: No pitting leg edema bilaterally. 2+DP/PT pulse bilaterally. Musculoskeletal: No joint deformities, No joint redness or warmth, no limitation of ROM in spin. Skin: No rashes.  Neuro: Alert, oriented X3, cranial nerves II-XII grossly intact, moves all extremities Psych: Patient is not psychotic, no suicidal or hemocidal ideation.  Labs on Admission:  Basic Metabolic Panel:  Recent Labs Lab 08/02/15 1743  NA 136  K 3.9  CL 104  CO2 23  GLUCOSE 145*  BUN 61*  CREATININE 2.10*  CALCIUM 14.0*   Liver Function Tests:  Recent Labs Lab 08/02/15 1743  AST 61*  ALT 42  ALKPHOS 104  BILITOT 0.7  PROT 6.5  ALBUMIN 3.2*    Recent Labs Lab 08/02/15 1743  LIPASE 32   No results for input(s): AMMONIA in the last 168 hours. CBC:  Recent Labs Lab 08/02/15 1743  WBC 19.3*  NEUTROABS 15.3*  HGB 13.7  HCT 40.3  MCV 87.0  PLT 253   Cardiac Enzymes:  Recent Labs Lab 08/02/15 1743  CKTOTAL 513*    BNP (last 3 results) No results for input(s): BNP in the last 8760 hours.  ProBNP (last 3 results) No results for input(s): PROBNP in the last 8760 hours.  CBG: No results for input(s): GLUCAP in the last 168 hours.  Radiological Exams on Admission: Ct Abdomen Pelvis Wo Contrast  08/02/2015  CLINICAL DATA:  Diarrhea, hypotension. EXAM: CT ABDOMEN AND PELVIS WITHOUT CONTRAST TECHNIQUE: Multidetector CT imaging of the abdomen and pelvis was performed following the standard protocol without IV contrast. COMPARISON:  CT scan of June 29, 2014. FINDINGS: Pathologic fracture of T10 vertebral body is noted. Lytic metastatic lesions are noted in the T9 and T11 vertebral bodies. Lytic metastatic lesion is noted in right iliac wing as well. Stable chronic pleural thickening is noted in right lung base posteriorly  consistent with prior pleurodesis. No gallstones are noted. No focal abnormality is noted the liver, spleen or pancreas on these unenhanced images. Adrenal glands are unremarkable. Nonobstructive right nephrolithiasis is noted. No hydronephrosis or renal obstruction is noted. No ureteral calculi are noted. The appendix appears normal. There is no evidence of bowel obstruction. Atherosclerosis of abdominal aorta is noted without aneurysm formation. No abnormal fluid collection is noted. Status post hysterectomy. Urinary bladder is decompressed. Ovaries are unremarkable. No significant adenopathy is noted. Large amount of stool is noted in the rectum concerning for impaction. There is noted mild diffuse wall thickening of the rectum concerning for proctitis. Inflammatory changes are noted in the surrounding tissues. IMPRESSION: Pathologic compression fracture of T10 vertebral body is noted. Lytic metastatic lesions are also noted in the T9 and T11 vertebral bodies, as well as the right iliac wing. Nonobstructive right nephrolithiasis. No hydronephrosis or renal obstruction is noted. Mild diffuse wall thickening of the rectum is noted with surrounding inflammatory changes concerning for proctitis. Large amount of stool is noted in the rectum concerning for impaction. Electronically Signed   By: Marijo Conception, M.D.   On: 08/02/2015 19:53   Dg Chest 2 View  08/02/2015  CLINICAL DATA:  Weakness and low back pain.  Fell last night. EXAM: CHEST  2 VIEW COMPARISON:  04/27/2015 FINDINGS: Heart size is normal. There is atherosclerosis of the aorta. There is been previous surgery in the right chest wall and axillary region. There is chronic pulmonary scarring but no sign of active infiltrate, mass, or infiltrate. I think there is mild basilar atelectasis, particularly at the right base. Old lower thoracic compression fractures again noted. This may have progressed slightly. IMPRESSION: Chronic pulmonary scarring. Mild  atelectasis at the lung bases, right more than left. Old lower thoracic compression fracture which appears to have progressed slightly. Electronically Signed   By: Nelson Chimes M.D.   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)   Hypercalcemia   Sepsis ([place]): The patient is septic with leukocytosis, elevated lactate and tachypnea. Etiology is not clear. Urinalysis negative. Chest x-ray did not show infiltration. CT abdomen showed possible proctitis,  which might be the etiology. Blood pressure was soft, which responded to IV fluids. Currently hemodynamically stable.  -will admit to tele bed -start IV Cipro and Flagyl -will get Procalcitonin and trend lactic acid levels per sepsis protocol. -IVF: 3L of NS bolus in ED, followed by 75 cc/h -f/u Bx  Diarrhea: likely due to proctitis as shown by CT scan, but need to rule out other possibilities, such as viral enteritis and C. Difficile colitis. -will check c diff pcr and GI path panel pcr -IVF as above -Abx as above -when necessary Zofran for nausea and morphine for pain  Hypocalcemia: Calcium 14.0, mental status okay. Most likely due to bone lysis secondary to metastasized disease, but need to r/o other possibilities, such as multiple myeloma and hyperparathyroidism. -Aggressive IV fluid: Normal saline 3 L, followed by 75 mL per hour -Hold vitamin D and calcium supplement -Followed up by the med. 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 Followed up by Dr. Marko Plume. Currently on  letrozole -Continue letrozole -Follow up with Dr. Marko Plume  AKI: likely multifactorial, including hypercalcemia, elevated CK, prerenal secondary to dehydration and continuation of Cozaar. No hydronephrosis on CT scan - IVF as above - Check FeNa - Follow up renal function by BMP - hold Cozarr  DVT ppx: SQ Heparin   Code Status: DNR Family Communication: Yes, patient's mother and uncle    at bed side Disposition Plan: Admit to inpatient   Date of Service 08/02/2015    Ivor Costa [facility] Pager [phone]  If 7PM-7AM, please contact night-coverage [email or web address] Password [facility] 08/02/2015, 10:42 PM

## 2015-08-02 NOTE — ED Notes (Signed)
[facility] presents with a 75 yo female complaining of weakness and low BP after approximately 4 to 5 days of diarrhea.  Pt was prescribed Valium for chronic low back pain approximately one week ago.  Pt became constipated and bought an OTC medication for constipation.  Pt subsequently had diarrhea for past 4 to 5 days.  Called [facility] last night for fall but refused transport and is here today because of weakness, diarrhea and low BP.  [facility] BP 85/50 with 250 ml NS given.  CBG 176; HR at 88 bpm and 12 lead was obtained by [facility] and was NSR.

## 2015-08-02 NOTE — Progress Notes (Signed)
Patient presents to Ed post fall.  EDCM spoke to patient and her family at bedside.  Patient confirms she lives at home with her mother who is 73 years old, her only caretaker.  Patient reports her mother has been assisting her with her ADL's.  The last time patient has been able to complete her ADL's on her own was approximately one month ago per patient.  Patient has a walker and a can eat home, no other dme.  Patient's pcp is Dr. Bevelyn Buckles.  Patient reports she has never had home health services before.  Patient states, "I need someone to help me because my mother can't help me like this."  [facility] provided patient with list of home health agencies in [place] explained services.  EDCM explained to patient that agency of her choice has 24-48 hours to contact her.  EDCM also explained to patient possible short term rehab if appropriate.  Patient thankful for services.  No further EDCM needs at this time.

## 2015-08-02 NOTE — Progress Notes (Signed)
ANTIBIOTIC CONSULT NOTE - INITIAL  Pharmacy Consult for Cipro Indication: r/o intra-abd infection  Allergies  Allergen Reactions  . Diphenhydramine Hcl Other (See Comments)    Severe headaches  . Oseltamivir Phosphate Nausea And Vomiting    REACTION: rash  . Oxycodone-Acetaminophen Nausea And Vomiting    REACTION: emesis  . Prednisone Other (See Comments)    REACTION: hiatal hernia, runny  Nose, felt terrible  . Sudafed [Pseudoephedrine Hcl]     vertigo  . Codeine Sulfate Rash    REACTION: rash   Patient Measurements:   Total body weight: 83.5 kg  Vital Signs: Temp: 99.3 F (37.4 C) (01/11 1951) Temp Source: Oral (01/11 1951) BP: 137/71 mmHg (01/11 2100) Pulse Rate: 81 (01/11 2100) Intake/Output from previous day:   Intake/Output from this shift:    Labs:  Recent Labs  08/02/15 1743  WBC 19.3*  HGB 13.7  PLT 253  CREATININE 2.10*   Estimated Creatinine Clearance: 24.8 mL/min (by C-G formula based on Cr of 2.1). No results for input(s): VANCOTROUGH, VANCOPEAK, VANCORANDOM, GENTTROUGH, GENTPEAK, GENTRANDOM, TOBRATROUGH, TOBRAPEAK, TOBRARND, AMIKACINPEAK, AMIKACINTROU, AMIKACIN in the last 72 hours.   Microbiology: No results found for this or any previous visit (from the past 720 hour(s)).  Medical History: Past Medical History  Diagnosis Date  . Pleural effusion 11-08-2008  . Breast cancer ([place]) 07-13-1997  . Chronic back pain   . Hypertension    Medications:  Scheduled:  . [START ON 08/03/2015] ferrous sulfate  325 mg Oral Once per day on Mon Thu  . heparin  5,000 Units Subcutaneous 3 times per day  . [START ON 08/03/2015] letrozole  2.5 mg Oral Daily  . loratadine  10 mg Oral Daily  . [START ON 08/03/2015] multivitamin with minerals  1 tablet Oral Daily  . simvastatin  20 mg Oral QHS  . sodium chloride  3 mL Intravenous Q12H  . vitamin B-12  1,000 mcg Oral BID  . [START ON 08/03/2015] vitamin C  500 mg Oral Daily  . [START ON 08/03/2015] vitamin E  200  Units Oral Daily   Anti-infectives    Start     Dose/Rate Route Frequency Ordered Stop   08/02/15 2145  metroNIDAZOLE (FLAGYL) IVPB 500 mg     500 mg 100 mL/hr over 60 Minutes Intravenous Every 8 hours 08/02/15 2139       Assessment: 75 yoF, lives with her 47 yo mother (daughter's caregiver).  To ED with weakness after fall at home, hx recent compression fx. Begin Cipro for suspected intra-abd infection. Admit SCr elevated, likely due to dehydration, previous SCr wnl 9/16.  Goal of Therapy:  Appropriate antibiiotic, dose schedule for treatment, renal function  Plan:   Cipro 400mg  IV q24  Flagyl 500mg  IV q8 - dose/schedule appropriate  Minda Ditto PharmD Pager [phone] 08/02/2015, 9:55 PM

## 2015-08-03 DIAGNOSIS — A419 Sepsis, unspecified organism: Principal | ICD-10-CM

## 2015-08-03 DIAGNOSIS — I10 Essential (primary) hypertension: Secondary | ICD-10-CM

## 2015-08-03 DIAGNOSIS — N179 Acute kidney failure, unspecified: Secondary | ICD-10-CM

## 2015-08-03 DIAGNOSIS — A09 Infectious gastroenteritis and colitis, unspecified: Secondary | ICD-10-CM

## 2015-08-03 LAB — COMPREHENSIVE METABOLIC PANEL
ALK PHOS: 94 U/L (ref 38–126)
ALT: 43 U/L (ref 14–54)
ANION GAP: 7 (ref 5–15)
AST: 47 U/L — ABNORMAL HIGH (ref 15–41)
Albumin: 2.6 g/dL — ABNORMAL LOW (ref 3.5–5.0)
BILIRUBIN TOTAL: 0.8 mg/dL (ref 0.3–1.2)
BUN: 57 mg/dL — ABNORMAL HIGH (ref 6–20)
CALCIUM: 12.4 mg/dL — AB (ref 8.9–10.3)
CO2: 23 mmol/L (ref 22–32)
Chloride: 108 mmol/L (ref 101–111)
Creatinine, Ser: 1.67 mg/dL — ABNORMAL HIGH (ref 0.44–1.00)
GFR, EST AFRICAN AMERICAN: 34 mL/min — AB (ref >60)
GFR, EST NON AFRICAN AMERICAN: 30 mL/min — AB (ref >60)
Glucose, Bld: 118 mg/dL — ABNORMAL HIGH (ref 65–99)
Potassium: 3.3 mmol/L — ABNORMAL LOW (ref 3.5–5.1)
SODIUM: 138 mmol/L (ref 135–145)
TOTAL PROTEIN: 5.7 g/dL — AB (ref 6.5–8.1)

## 2015-08-03 LAB — SODIUM, URINE, RANDOM: Sodium, Ur: 11 mmol/L

## 2015-08-03 LAB — CBC
HCT: 36.4 % (ref 36.0–46.0)
HEMOGLOBIN: 12.4 g/dL (ref 12.0–15.0)
MCH: 29.7 pg (ref 26.0–34.0)
MCHC: 34.1 g/dL (ref 30.0–36.0)
MCV: 87.1 fL (ref 78.0–100.0)
Platelets: 229 10*3/uL (ref 150–400)
RBC: 4.18 MIL/uL (ref 3.87–5.11)
RDW: 13.2 % (ref 11.5–15.5)
WBC: 12.1 10*3/uL — AB (ref 4.0–10.5)

## 2015-08-03 LAB — CREATININE, URINE, RANDOM: CREATININE, URINE: 109.88 mg/dL

## 2015-08-03 LAB — CBG MONITORING, ED: GLUCOSE-CAPILLARY: 79 mg/dL (ref 65–99)

## 2015-08-03 LAB — LACTIC ACID, PLASMA: Lactic Acid, Venous: 1.9 mmol/L (ref 0.5–2.0)

## 2015-08-03 MED ORDER — METRONIDAZOLE IN NACL 5-0.79 MG/ML-% IV SOLN
500.0000 mg | Freq: Three times a day (TID) | INTRAVENOUS | Status: DC
  Administered 2015-08-03 – 2015-08-05 (×5): 500 mg via INTRAVENOUS
  Filled 2015-08-03 (×6): qty 100

## 2015-08-03 MED ORDER — CIPROFLOXACIN IN D5W 400 MG/200ML IV SOLN
400.0000 mg | Freq: Two times a day (BID) | INTRAVENOUS | Status: DC
  Administered 2015-08-03 – 2015-08-05 (×4): 400 mg via INTRAVENOUS
  Filled 2015-08-03 (×5): qty 200

## 2015-08-03 MED ORDER — LOPERAMIDE HCL 2 MG PO CAPS
4.0000 mg | ORAL_CAPSULE | Freq: Once | ORAL | Status: AC
  Administered 2015-08-03: 4 mg via ORAL
  Filled 2015-08-03: qty 2

## 2015-08-03 NOTE — ED Notes (Signed)
4TH FLOOR CALLED AND WAITING FOR FLOOR NURSE TO RETURN CALL.

## 2015-08-03 NOTE — Progress Notes (Signed)
Pharmacy Antibiotic Follow-up Note  Lisa [name] is a 75 y.o. year-old female admitted on 08/02/2015.  The patient is currently on day 1 of Cipro/Flagyl for sepsis from presumed IAI.  Assessment/Plan: The dose of Cipro will be adjusted to 400mg  IV q12h based on renal function.  Continue Flagyl per MD  Temp (24hrs), Avg:98.6 F (37 C), Min:98 F (36.7 C), Max:99.3 F (37.4 C)   Recent Labs Lab 08/02/15 1743 08/03/15 0427  WBC 19.3* 12.1*    Recent Labs Lab 08/02/15 1743 08/03/15 0427  CREATININE 2.10* 1.67*   Estimated Creatinine Clearance: 31.1 mL/min (by C-G formula based on Cr of 1.67).    Allergies  Allergen Reactions  . Diphenhydramine Hcl Other (See Comments)    Severe headaches  . Oseltamivir Phosphate Nausea And Vomiting    REACTION: rash  . Oxycodone-Acetaminophen Nausea And Vomiting    REACTION: emesis  . Prednisone Other (See Comments)    REACTION: hiatal hernia, runny  Nose, felt terrible  . Sudafed [Pseudoephedrine Hcl]     vertigo  . Codeine Sulfate Rash    REACTION: rash    Antimicrobials this admission: 1/11 Cipro >>  1/11 Flagyl >>  Levels/dose changes this admission: 1/12: Cipro dose increased to q12h for improvement in renal fxn  Microbiology results: 1/11 BCx:   Thank you for allowing pharmacy to be a part of this patient's care.  Biagio Borg PharmD 08/03/2015 2:38 PM

## 2015-08-03 NOTE — Progress Notes (Signed)
PT Cancellation Note  Patient Details Name: Lisa [name] MRN: [id number] DOB: [birth year]   Cancelled Treatment:    Reason Eval/Treat Not Completed: Other (comment) (patient was in transit to floor. will return 1/13 for evaluation.)   Claretha Cooper 08/03/2015, 5:05 PM Tresa Endo PT [phone]

## 2015-08-03 NOTE — ED Notes (Signed)
Pt did not pass enough stool to collect

## 2015-08-03 NOTE — Progress Notes (Signed)
PROGRESS NOTE  Lisa [name] [id number] DOB: [birth year] DOA: 08/02/2015 PCP: [name], MD   HPI: Lisa [name] is a 75 y.o. female with PMH of metastatic breast cancer (S/p mastectomy), chronic back pain, hypertension, hyperlipidemia, who presents recent diarrhea and weakness.  Subjective / 24 H Interval events - feeling better this morning, no nausea/vomiting. 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 Etiology is not clear. Urinalysis negative. Chest x-ray did not show infiltration. CT abdomen showed possible proctitis, which might be the etiology. Blood pressure was soft, which responded to IV fluids. Currently hemodynamically stable. - continue antibiotics, seems to be improving overnight - C diff and pathogen panel pending still, could not be collected since not enough sample overnight  Diarrhea:  - likely due to proctitis as shown by CT scan, but need to rule out other possibilities, such as viral enteritis and C. Difficile colitis. 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 Followed up by Dr. Marko Plume. Currently on letrozole - Continue letrozole - Follow up with Dr. Marko Plume as an outpatient  AKI  - likely multifactorial,  including hypercalcemia, elevated CK, prerenal secondary to dehydration and continuation of Cozaar. No hydronephrosis on CT scan - IVF as above - improving  Diet: Diet NPO time specified Except for: Sips with Meds Fluids: NS DVT Prophylaxis: heparin  Code Status: DNR Family Communication: no family bedside  Disposition Plan: home when ready. PT pending  Barriers to discharge: IV therapies  Consultants:  None   Procedures:  None    Antibiotics Ciprofloxacin 1/11 >>  Metronidazole 1/11 >>   Studies  Ct Abdomen Pelvis Wo Contrast  08/02/2015  CLINICAL DATA:  Diarrhea, hypotension. EXAM: CT ABDOMEN AND PELVIS WITHOUT CONTRAST TECHNIQUE: Multidetector CT imaging of the abdomen and pelvis was performed following the standard protocol without IV contrast. COMPARISON:  CT scan of June 29, 2014. FINDINGS: Pathologic fracture of T10 vertebral body is noted. Lytic metastatic lesions are noted in the T9 and T11 vertebral bodies. Lytic metastatic lesion is noted in right iliac wing as well. Stable chronic pleural thickening is noted in right lung base posteriorly consistent with prior pleurodesis. No gallstones are noted. No focal abnormality is noted the liver, spleen or pancreas on these unenhanced images. Adrenal glands are unremarkable. Nonobstructive right nephrolithiasis is noted. No hydronephrosis or renal obstruction is noted. No ureteral calculi are noted. The appendix appears normal. There is no evidence of bowel obstruction. Atherosclerosis of abdominal aorta is noted without aneurysm formation. No abnormal fluid collection is noted. Status post hysterectomy. Urinary bladder is decompressed. Ovaries are unremarkable. No significant adenopathy is noted. Large amount of stool is noted in the rectum concerning for impaction. There is noted mild diffuse wall thickening of the rectum concerning  for proctitis. Inflammatory changes are noted in the surrounding tissues. IMPRESSION: Pathologic  compression fracture of T10 vertebral body is noted. Lytic metastatic lesions are also noted in the T9 and T11 vertebral bodies, as well as the right iliac wing. Nonobstructive right nephrolithiasis. No hydronephrosis or renal obstruction is noted. Mild diffuse wall thickening of the rectum is noted with surrounding inflammatory changes concerning for proctitis. Large amount of stool is noted in the rectum concerning for impaction. Electronically Signed   By: Marijo Conception, M.D.   On: 08/02/2015 19:53   Dg Chest 2 View  08/02/2015  CLINICAL DATA:  Weakness and low back pain.  Fell last night. EXAM: CHEST  2 VIEW COMPARISON:  04/27/2015 FINDINGS: Heart size is normal. There is atherosclerosis of the aorta. There is been previous surgery in the right chest wall and axillary region. There is chronic pulmonary scarring but no sign of active infiltrate, mass, or infiltrate. I think there is mild basilar atelectasis, particularly at the right base. Old lower thoracic compression fractures again noted. This may have progressed slightly. IMPRESSION: Chronic pulmonary scarring. Mild atelectasis at the lung bases, right more than left. Old lower thoracic compression fracture which appears to have progressed slightly. Electronically Signed   By: Nelson Chimes M.D.   On: 08/02/2015 17:42    Objective  Filed Vitals:   08/03/15 1030 08/03/15 1100 08/03/15 1121 08/03/15 1331  BP: 135/77 140/64 140/65 133/71  Pulse:   86 86  Temp:    98 F (36.7 C)  TempSrc:    Oral  Resp: 22 23 20 20   SpO2:   97% 95%    Intake/Output Summary (Last 24 hours) at 08/03/15 1351 Last data filed at 08/03/15 0122  Gross per 24 hour  Intake      3 ml  Output      0 ml  Net      3 ml   There were no vitals filed for this visit.  Exam:  GENERAL: NAD  HEENT: no scleral icterus, PERRL  NECK: supple, no LAD  LUNGS: CTA biL, no wheezing  HEART: RRR without MRG  ABDOMEN: soft, mild tenderness to palpation midepigastric  area  EXTREMITIES: no clubbing / cyanosis  NEUROLOGIC: non focal  Data Reviewed: Basic Metabolic Panel:  Recent Labs Lab 08/02/15 1743 08/03/15 0427  NA 136 138  K 3.9 3.3*  CL 104 108  CO2 23 23  GLUCOSE 145* 118*  BUN 61* 57*  CREATININE 2.10* 1.67*  CALCIUM 14.0* 12.4*  MG 2.1  --   PHOS 3.3  --    Liver Function Tests:  Recent Labs Lab 08/02/15 1743 08/03/15 0427  AST 61* 47*  ALT 42 43  ALKPHOS 104 94  BILITOT 0.7 0.8  PROT 6.5 5.7*  ALBUMIN 3.2* 2.6*    Recent Labs Lab 08/02/15 1743  LIPASE 32   CBC:  Recent Labs Lab 08/02/15 1743 08/03/15 0427  WBC 19.3* 12.1*  NEUTROABS 15.3*  --   HGB 13.7 12.4  HCT 40.3 36.4  MCV 87.0 87.1  PLT 253 229   Cardiac Enzymes:  Recent Labs Lab 08/02/15 1743  CKTOTAL 513*   CBG:  Recent Labs Lab 08/03/15 0748  GLUCAP 79    Recent Results (from the past 240 hour(s))  Culture, blood (x 2)     Status: None (Preliminary result)   Collection Time: 08/02/15 11:20 PM  Result Value Ref Range Status   Specimen Description BLOOD RIGHT ARM  Final  Special Requests 7 ML AEROBIC BOTTLE ONLY  Final   Culture PENDING  Incomplete   Report Status PENDING  Incomplete     Scheduled Meds: . ferrous sulfate  325 mg Oral Once per day on Mon Thu  . heparin  5,000 Units Subcutaneous 3 times per day  . letrozole  2.5 mg Oral Daily  . loratadine  10 mg Oral Daily  . multivitamin with minerals  1 tablet Oral Daily  . simvastatin  20 mg Oral QHS  . sodium chloride  3 mL Intravenous Q12H  . vitamin B-12  1,000 mcg Oral BID  . vitamin C  500 mg Oral Daily  . vitamin E  200 Units Oral Daily   Continuous Infusions: . sodium chloride Stopped (08/03/15 0657)  . ciprofloxacin Stopped (08/03/15 0357)  . metronidazole Stopped (08/03/15 0254)    Marzetta Board, MD [facility] Pager [phone]. If 7 PM - 7 AM, please contact night-coverage at [email or web address], password [facility] 08/03/2015, 1:51 PM  LOS: 1 day

## 2015-08-04 DIAGNOSIS — A047 Enterocolitis due to Clostridium difficile: Secondary | ICD-10-CM

## 2015-08-04 LAB — GASTROINTESTINAL PANEL BY PCR, STOOL (REPLACES STOOL CULTURE)
Adenovirus F40/41: NOT DETECTED
Astrovirus: NOT DETECTED
CAMPYLOBACTER SPECIES: NOT DETECTED
CYCLOSPORA CAYETANENSIS: NOT DETECTED
Cryptosporidium: NOT DETECTED
E. coli O157: NOT DETECTED
Entamoeba histolytica: NOT DETECTED
Enteroaggregative E coli (EAEC): NOT DETECTED
Enteropathogenic E coli (EPEC): NOT DETECTED
Enterotoxigenic E coli (ETEC): NOT DETECTED
GIARDIA LAMBLIA: NOT DETECTED
Norovirus GI/GII: NOT DETECTED
PLESIMONAS SHIGELLOIDES: NOT DETECTED
ROTAVIRUS A: NOT DETECTED
SALMONELLA SPECIES: NOT DETECTED
SHIGELLA/ENTEROINVASIVE E COLI (EIEC): NOT DETECTED
Sapovirus (I, II, IV, and V): NOT DETECTED
Shiga like toxin producing E coli (STEC): NOT DETECTED
VIBRIO SPECIES: NOT DETECTED
Vibrio cholerae: NOT DETECTED
YERSINIA ENTEROCOLITICA: NOT DETECTED

## 2015-08-04 LAB — COMPREHENSIVE METABOLIC PANEL
ALBUMIN: 2.5 g/dL — AB (ref 3.5–5.0)
ALT: 35 U/L (ref 14–54)
ANION GAP: 8 (ref 5–15)
AST: 36 U/L (ref 15–41)
Alkaline Phosphatase: 97 U/L (ref 38–126)
BILIRUBIN TOTAL: 0.7 mg/dL (ref 0.3–1.2)
BUN: 38 mg/dL — AB (ref 6–20)
CHLORIDE: 111 mmol/L (ref 101–111)
CO2: 22 mmol/L (ref 22–32)
CREATININE: 1.55 mg/dL — AB (ref 0.44–1.00)
Calcium: 11.6 mg/dL — ABNORMAL HIGH (ref 8.9–10.3)
GFR calc Af Amer: 37 mL/min — ABNORMAL LOW (ref >60)
GFR calc non Af Amer: 32 mL/min — ABNORMAL LOW (ref >60)
GLUCOSE: 135 mg/dL — AB (ref 65–99)
POTASSIUM: 2.9 mmol/L — AB (ref 3.5–5.1)
SODIUM: 141 mmol/L (ref 135–145)
TOTAL PROTEIN: 5.5 g/dL — AB (ref 6.5–8.1)

## 2015-08-04 LAB — C DIFFICILE QUICK SCREEN W PCR REFLEX
C Diff antigen: POSITIVE — AB
C Diff toxin: NEGATIVE

## 2015-08-04 LAB — CBC
HCT: 35.3 % — ABNORMAL LOW (ref 36.0–46.0)
HEMOGLOBIN: 12.1 g/dL (ref 12.0–15.0)
MCH: 29.7 pg (ref 26.0–34.0)
MCHC: 34.3 g/dL (ref 30.0–36.0)
MCV: 86.7 fL (ref 78.0–100.0)
Platelets: 228 10*3/uL (ref 150–400)
RBC: 4.07 MIL/uL (ref 3.87–5.11)
RDW: 13 % (ref 11.5–15.5)
WBC: 9.5 10*3/uL (ref 4.0–10.5)

## 2015-08-04 LAB — PROTEIN ELECTROPHORESIS, SERUM
A/G RATIO SPE: 0.9 (ref 0.7–1.7)
ALPHA-1-GLOBULIN: 0.4 g/dL (ref 0.0–0.4)
ALPHA-2-GLOBULIN: 1.1 g/dL — AB (ref 0.4–1.0)
Albumin ELP: 3 g/dL (ref 2.9–4.4)
BETA GLOBULIN: 1.1 g/dL (ref 0.7–1.3)
Gamma Globulin: 0.9 g/dL (ref 0.4–1.8)
Globulin, Total: 3.5 g/dL (ref 2.2–3.9)
Total Protein ELP: 6.5 g/dL (ref 6.0–8.5)

## 2015-08-04 LAB — GLUCOSE, CAPILLARY: Glucose-Capillary: 99 mg/dL (ref 65–99)

## 2015-08-04 LAB — PARATHYROID HORMONE, INTACT (NO CA): PTH: 8 pg/mL — AB (ref 15–65)

## 2015-08-04 MED ORDER — DICLOFENAC EPOLAMINE 1.3 % TD PTCH
1.0000 | MEDICATED_PATCH | Freq: Two times a day (BID) | TRANSDERMAL | Status: DC
Start: 2015-08-04 — End: 2015-08-07
  Administered 2015-08-04 – 2015-08-07 (×7): 1 via TRANSDERMAL
  Filled 2015-08-04 (×11): qty 1

## 2015-08-04 MED ORDER — ACETAMINOPHEN 325 MG PO TABS
650.0000 mg | ORAL_TABLET | Freq: Four times a day (QID) | ORAL | Status: DC | PRN
  Administered 2015-08-04 – 2015-08-07 (×6): 650 mg via ORAL
  Filled 2015-08-04 (×5): qty 2

## 2015-08-04 MED ORDER — VANCOMYCIN 50 MG/ML ORAL SOLUTION
125.0000 mg | Freq: Four times a day (QID) | ORAL | Status: DC
  Administered 2015-08-04 – 2015-08-07 (×13): 125 mg via ORAL
  Filled 2015-08-04 (×14): qty 2.5

## 2015-08-04 MED ORDER — POTASSIUM CHLORIDE CRYS ER 20 MEQ PO TBCR
40.0000 meq | EXTENDED_RELEASE_TABLET | ORAL | Status: AC
  Administered 2015-08-04 (×3): 40 meq via ORAL
  Filled 2015-08-04 (×3): qty 2

## 2015-08-04 NOTE — Progress Notes (Signed)
OT Cancellation Note  Patient Details Name: Lisa [name] MRN: [id number] DOB: 1941-07-18   Cancelled Treatment:    Reason Eval/Treat Not Completed: Other (comment) Pt eating lunch and ask OT to return at a later time. Will check back on pt later in day or next day  Hoffman, Thereasa Parkin 08/04/2015, 1:37 PM

## 2015-08-04 NOTE — NC FL2 (Signed)
[place] LEVEL OF CARE SCREENING TOOL     IDENTIFICATION  Patient Name: Lisa [name] Birthdate: May 15, 1941 Sex: female Admission Date (Current Location): 08/02/2015  [facility] and [place] Number:  Herbalist and Address:  [facility],  [place] [address], [place]      Provider Number: [id number]  Attending Physician Name and Address:  Caren Griffins, MD  Relative Name and Phone Number:       Current Level of Care: Hospital Recommended Level of Care: [place] Prior Approval Number:    Date Approved/Denied:   PASRR Number: [id number]  Discharge Plan: SNF    Current Diagnoses: Patient Active Problem List   Diagnosis Date Noted  . Sepsis ([place]) 08/02/2015  . Diarrhea 08/02/2015  . AKI (acute kidney injury) ([place]) 08/02/2015  . Hypercalcemia 08/02/2015  . Acute kidney injury ([place])   . Bone metastases ([place])   . 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 PLEURAL EFFUSION 10/13/2008  . ACUTE BRONCHOSPASM 06/27/2008  . OSTEOPENIA 02/03/2008  . UNS ADVRS EFF UNS RX MEDICINAL&BIOLOGICAL SBSTNC 01/04/2008  . HLD (hyperlipidemia) 07/24/2007  . Essential hypertension 07/24/2007  . ALLERGIC RHINITIS 07/24/2007  . BREAST CANCER, HX OF 07/24/2007    Orientation RESPIRATION BLADDER Height & Weight    Self, Time, Situation, Place  Normal Incontinent 5\' 3"  (160 cm) 182 lbs.  BEHAVIORAL SYMPTOMS/MOOD NEUROLOGICAL BOWEL NUTRITION STATUS      Incontinent Diet (regular)  AMBULATORY STATUS COMMUNICATION OF NEEDS Skin   Limited Assist Verbally Normal                       Personal Care Assistance Level of Assistance  Bathing, Feeding, Dressing Bathing Assistance: Limited assistance Feeding assistance: Limited assistance Dressing Assistance: Limited assistance     Functional Limitations Info             [place]  PT (By licensed PT), OT (By licensed OT)     PT  Frequency: 5 OT Frequency: 5            Contractures      Additional Factors Info  Code Status, Allergies Code Status Info: DNR Allergies Info: Diphenhydramine Hcl, Oseltamivir Phosphate, Oxycodone-acetaminophen, Prednisone, Sudafed, Codeine Sulfate           Current Medications (08/04/2015):  This is the current hospital active medication list Current Facility-Administered Medications  Medication Dose Route Frequency Provider Last Rate Last Dose  . 0.9 %  sodium chloride infusion   Intravenous Continuous Ivor Costa, MD   Stopped at 08/03/15 [phone]  . acetaminophen (TYLENOL) tablet 650 mg  650 mg Oral Q6H PRN Caren Griffins, MD   650 mg at 08/04/15 1047  . ciprofloxacin (CIPRO) IVPB 400 mg  400 mg Intravenous Q12H Thomes Lolling, RPH   400 mg at 08/04/15 0345  . diazepam (VALIUM) tablet 2 mg  2 mg Oral Q6H PRN Ivor Costa, MD      . diclofenac (FLECTOR) 1.3 % 1 patch  1 patch Transdermal BID Caren Griffins, MD   1 patch at 08/04/15 1000  . ferrous sulfate tablet 325 mg  325 mg Oral Once per day on Mon Thu Xilin Niu, MD   325 mg at 08/03/15 1025  . heparin injection 5,000 Units  5,000 Units Subcutaneous 3 times per day Ivor Costa, MD   5,000 Units at 08/04/15 1330  . letrozole [facility]) tablet 2.5 mg  2.5 mg Oral Daily Ivor Costa, MD   2.5 mg at 08/04/15 0854  . loratadine (CLARITIN) tablet 10 mg  10 mg Oral Daily Ivor Costa, MD   10 mg at 08/04/15 0855  . metroNIDAZOLE (FLAGYL) IVPB 500 mg  500 mg Intravenous Q8H Terri L Green, RPH   500 mg at 08/04/15 1330  . morphine 2 MG/ML injection 2 mg  2 mg Intravenous Q4H PRN Ivor Costa, MD   2 mg at 08/04/15 1159  . multivitamin with minerals tablet 1 tablet  1 tablet Oral Daily Ivor Costa, MD   1 tablet at 08/04/15 0855  . ondansetron (ZOFRAN) injection 4 mg  4 mg Intravenous Q8H PRN Ivor Costa, MD      . potassium chloride SA (K-DUR,KLOR-CON) CR tablet 40 mEq  40 mEq Oral Q3H [name], MD   40 mEq at 08/04/15 1117  . simvastatin  (ZOCOR) tablet 20 mg  20 mg Oral QHS Ivor Costa, MD   20 mg at 08/03/15 2121  . sodium chloride 0.9 % injection 3 mL  3 mL Intravenous Q12H Ivor Costa, MD   3 mL at 08/03/15 2121  . vancomycin (VANCOCIN) 50 mg/mL oral solution 125 mg  125 mg Oral 4 times per day Caren Griffins, MD   125 mg at 08/04/15 1330  . vitamin B-12 (CYANOCOBALAMIN) tablet 1,000 mcg  1,000 mcg Oral BID Ivor Costa, MD   1,000 mcg at 08/04/15 0854  . vitamin C (ASCORBIC ACID) tablet 500 mg  500 mg Oral Daily Ivor Costa, MD   500 mg at 08/04/15 0855  . vitamin E capsule 200 Units  200 Units Oral Daily Ivor Costa, MD   200 Units at 08/04/15 [id number]     Discharge Medications: Please see discharge summary for a list of discharge medications.  Relevant Imaging Results:  Relevant Lab Results:   Additional Information SSN: [id number]  Standley Brooking, LCSW

## 2015-08-04 NOTE — Clinical Social Work Placement (Signed)
   CLINICAL SOCIAL WORK PLACEMENT  NOTE  Date:  08/04/2015  Patient Details  Name: Lisa [name] MRN: [id number] Date of Birth: 1941-07-08  Clinical Social Work is seeking post-discharge placement for this patient at the [place] level of care (*CSW will initial, date and re-position this form in  chart as items are completed):  Yes   Patient/family provided with [place] Work Department's list of facilities offering this level of care within the geographic area requested by the patient (or if unable, by the patient's family).  Yes   Patient/family informed of their freedom to choose among providers that offer the needed level of care, that participate in Medicare, Medicaid or managed care program needed by the patient, have an available bed and are willing to accept the patient.  Yes   Patient/family informed of [place]'s ownership interest in [facility] and [facility], as well as of the fact that they are under no obligation to receive care at these facilities.  PASRR submitted to EDS on 08/04/15     PASRR number received on 08/04/15     Existing PASRR number confirmed on       FL2 transmitted to all facilities in geographic area requested by pt/family on 08/04/15     FL2 transmitted to all facilities within larger geographic area on       Patient informed that his/her managed care company has contracts with or will negotiate with certain facilities, including the following:            Patient/family informed of bed offers received.  Patient chooses bed at       Physician recommends and patient chooses bed at      Patient to be transferred to   on  .  Patient to be transferred to facility by       Patient family notified on   of transfer.  Name of family member notified:        PHYSICIAN       Additional Comment:    _______________________________________________ Standley Brooking, LCSW 08/04/2015, 3:42 PM

## 2015-08-04 NOTE — Progress Notes (Signed)
OT Cancellation Note  Patient Details Name: Lisa [name] MRN: [id number] DOB: [birth year]   Cancelled Treatment:    Reason Eval/Treat Not Completed: Fatigue/lethargy limiting ability to participate  [place] 08/04/2015, 2:22 PM  Lesle Chris, OTR/L [id number] 08/04/2015

## 2015-08-04 NOTE — Progress Notes (Signed)
Pharmacy Antibiotic Follow-up Note  LO PAAP is a 75 y.o. year-old female admitted on 08/02/2015.  The patient is currently on day 2 of Cipro/Flagyl for presumed IAI and day 1 of PO Vancomycin for Cdiff.  Assessment/Plan:  Continue Flagyl and PO Vanc per MD orders.  Continue Cipro 400mg  IV q12h  Dosage remains stable and need for further dosage adjustment appears unlikely at present.  Pharmacy will sign off at this time.  Please reconsult if a change in clinical status warrants re-evaluation of dosage.     Temp (24hrs), Avg:99.3 F (37.4 C), Min:98 F (36.7 C), Max:100.5 F (38.1 C)   Recent Labs Lab 08/02/15 1743 08/03/15 0427 08/04/15 0454  WBC 19.3* 12.1* 9.5    Recent Labs Lab 08/02/15 1743 08/03/15 0427 08/04/15 0454  CREATININE 2.10* 1.67* 1.55*   Estimated Creatinine Clearance: 33.4 mL/min (by C-G formula based on Cr of 1.55).    Allergies  Allergen Reactions  . Diphenhydramine Hcl Other (See Comments)    Severe headaches  . Oseltamivir Phosphate Nausea And Vomiting    REACTION: rash  . Oxycodone-Acetaminophen Nausea And Vomiting    REACTION: emesis  . Prednisone Other (See Comments)    REACTION: hiatal hernia, runny  Nose, felt terrible  . Sudafed [Pseudoephedrine Hcl]     vertigo  . Codeine Sulfate Rash    REACTION: rash    Antimicrobials this admission: 1/11 Cipro >>  1/11 Flagyl >> 1/13 Vanc PO >>  Levels/dose changes this admission: None  Microbiology Results: 1/11 BCx: sent 1/13 Cdiff: atigen positive; toxin negative 1/13 GI panel: sent  Thank you for allowing pharmacy to be a part of this patient's care.  Gretta Arab PharmD, BCPS Pager [phone] 08/04/2015 1:13 PM

## 2015-08-04 NOTE — Progress Notes (Addendum)
PROGRESS NOTE  Lisa [name] [id number] DOB: 06-09-1941 DOA: 08/02/2015 PCP: [name], MD   HPI: Lisa [name] is a 75 y.o. female with PMH of metastatic breast cancer (S/p mastectomy), chronic back pain, hypertension, hyperlipidemia, who presents recent diarrhea and weakness.  Subjective / 24 H Interval events - ongoing loose stools. Fever last night. 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 Urinalysis negative. Chest x-ray did not show infiltration. CT abdomen showed possible proctitis, which might be the etiology. Blood pressure was soft, which responded to IV fluids. Currently hemodynamically stable.  Diarrhea, C diff colitis - likely due to proctitis as shown by CT scan, but need to rule out other possibilities, such as viral enteritis and C. Difficile colitis. - C diff with positive antigen and negative toxin. Will start treatment given severe diarrhea as main complaint and reason for admission - IVF as above - Abx as above, continue Cipro/Flagyl until rest of pathogen panel back. - when necessary Zofran for nausea and morphine for pain  Hypercalcemia  - multifactorial due to dehydration, calcium supplements, ?malignancy - improving with fluids, continue to monitor  HLD: Last LDL was 57 on 06/29/14  - Continue home medications: Zocor  Hypokalemia - due to diarrhea, replete and monitor  Essential hypertension - hold cozarr  Hx of breast cancer metastasized to pleura [facility]) - s/p of mastectomy. Followed up by Dr. Marko Plume. Currently on letrozole - Continue letrozole - Follow up with Dr. Marko Plume as an  outpatient  AKI  - likely multifactorial, including hypercalcemia, elevated CK, prerenal secondary to dehydration and continuation of Cozaar. No hydronephrosis on CT scan - IVF as above - improving  Diet: Diet regular Room service appropriate?: Yes; Fluid consistency:: Thin Fluids: NS DVT Prophylaxis: heparin  Code Status: DNR Family Communication: no family bedside  Disposition Plan: SNF when ready  Barriers to discharge: IV therapies  Consultants:  None   Procedures:  None    Antibiotics Ciprofloxacin 1/11 >>  Metronidazole 1/11 >> Po Vacomycin 1/13 >>   Studies  Ct Abdomen Pelvis Wo Contrast  08/02/2015  CLINICAL DATA:  Diarrhea, hypotension. EXAM: CT ABDOMEN AND PELVIS WITHOUT CONTRAST TECHNIQUE: Multidetector CT imaging of the abdomen and pelvis was performed following the standard protocol without IV contrast. COMPARISON:  CT scan of June 29, 2014. FINDINGS: Pathologic fracture of T10 vertebral body is noted. Lytic metastatic lesions are noted in the T9 and T11 vertebral bodies. Lytic metastatic lesion is noted in right iliac wing as well. Stable chronic pleural thickening is noted in right lung base posteriorly consistent with prior pleurodesis. No gallstones are noted. No focal abnormality is noted the liver, spleen or pancreas on these unenhanced images. Adrenal glands are unremarkable. Nonobstructive right nephrolithiasis is noted. No hydronephrosis or renal obstruction is noted. No ureteral calculi are noted. The appendix appears normal. There is no evidence of bowel obstruction. Atherosclerosis of abdominal aorta is noted without aneurysm formation. No abnormal fluid collection is noted. Status post hysterectomy. Urinary bladder is decompressed. Ovaries are unremarkable. No significant adenopathy is noted. Large amount of stool is noted in the rectum concerning for impaction. There is noted mild diffuse wall  thickening of the rectum concerning for proctitis.  Inflammatory changes are noted in the surrounding tissues. IMPRESSION: Pathologic compression fracture of T10 vertebral body is noted. Lytic metastatic lesions are also noted in the T9 and T11 vertebral bodies, as well as the right iliac wing. Nonobstructive right nephrolithiasis. No hydronephrosis or renal obstruction is noted. Mild diffuse wall thickening of the rectum is noted with surrounding inflammatory changes concerning for proctitis. Large amount of stool is noted in the rectum concerning for impaction. Electronically Signed   By: Marijo Conception, M.D.   On: 08/02/2015 19:53   Dg Chest 2 View  08/02/2015  CLINICAL DATA:  Weakness and low back pain.  Fell last night. EXAM: CHEST  2 VIEW COMPARISON:  04/27/2015 FINDINGS: Heart size is normal. There is atherosclerosis of the aorta. There is been previous surgery in the right chest wall and axillary region. There is chronic pulmonary scarring but no sign of active infiltrate, mass, or infiltrate. I think there is mild basilar atelectasis, particularly at the right base. Old lower thoracic compression fractures again noted. This may have progressed slightly. IMPRESSION: Chronic pulmonary scarring. Mild atelectasis at the lung bases, right more than left. Old lower thoracic compression fracture which appears to have progressed slightly. Electronically Signed   By: Nelson Chimes M.D.   On: 08/02/2015 17:42    Objective  Filed Vitals:   08/03/15 1937 08/04/15 0029 08/04/15 0104 08/04/15 0349  BP: 132/52   147/66  Pulse: 87   88  Temp: 99.5 F (37.5 C) 100.5 F (38.1 C) 99.9 F (37.7 C) 99.2 F (37.3 C)  TempSrc: Oral Oral Oral Oral  Resp: 21   20  Height:      Weight:      SpO2: 99%   97%    Intake/Output Summary (Last 24 hours) at 08/04/15 1244 Last data filed at 08/04/15 0700  Gross per 24 hour  Intake   2420 ml  Output      0 ml  Net   2420 ml   Filed Weights   08/03/15 1536  Weight: 82.6 kg (182 lb 1.6 oz)    Exam:  GENERAL:  NAD  HEENT: no scleral icterus, PERRL  NECK: supple, no LAD  LUNGS: CTA biL, no wheezing  HEART: RRR without MRG  ABDOMEN: soft, mild tenderness to palpation midepigastric area  EXTREMITIES: no clubbing / cyanosis  NEUROLOGIC: non focal  Data Reviewed: Basic Metabolic Panel:  Recent Labs Lab 08/02/15 1743 08/03/15 0427 08/04/15 0454  NA 136 138 141  K 3.9 3.3* 2.9*  CL 104 108 111  CO2 23 23 22   GLUCOSE 145* 118* 135*  BUN 61* 57* 38*  CREATININE 2.10* 1.67* 1.55*  CALCIUM 14.0* 12.4* 11.6*  MG 2.1  --   --   PHOS 3.3  --   --    Liver Function Tests:  Recent Labs Lab 08/02/15 1743 08/03/15 0427 08/04/15 0454  AST 61* 47* 36  ALT 42 43 35  ALKPHOS 104 94 97  BILITOT 0.7 0.8 0.7  PROT 6.5 5.7* 5.5*  ALBUMIN 3.2* 2.6* 2.5*    Recent Labs Lab 08/02/15 1743  LIPASE 32   CBC:  Recent Labs Lab 08/02/15 1743 08/03/15 0427 08/04/15 0454  WBC 19.3* 12.1* 9.5  NEUTROABS 15.3*  --   --   HGB 13.7 12.4 12.1  HCT 40.3 36.4 35.3*  MCV 87.0 87.1 86.7  PLT 253 229 228   Cardiac Enzymes:  Recent Labs Lab 08/02/15  [place]   CBG:  Recent Labs Lab 08/03/15 0748 08/04/15 0754  GLUCAP 79 99    Recent Results (from the past 240 hour(s))  Culture, blood (x 2)     Status: None (Preliminary result)   Collection Time: 08/02/15 11:20 PM  Result Value Ref Range Status   Specimen Description BLOOD RIGHT ARM  Final   Special Requests 7 ML AEROBIC BOTTLE ONLY  Final   Culture PENDING  Incomplete   Report Status PENDING  Incomplete  C difficile quick scan w PCR reflex     Status: Abnormal   Collection Time: 08/04/15 10:40 AM  Result Value Ref Range Status   C Diff antigen POSITIVE (A) NEGATIVE Final   C Diff toxin NEGATIVE NEGATIVE Final   C Diff interpretation   Final    C. difficile present, but toxin not detected. This indicates colonization. In most cases, this does not require treatment. If patient has signs and symptoms consistent  with colitis, consider treatment. Requires ENTERIC precautions.     Scheduled Meds: . ciprofloxacin  400 mg Intravenous Q12H  . diclofenac  1 patch Transdermal BID  . ferrous sulfate  325 mg Oral Once per day on Mon Thu  . heparin  5,000 Units Subcutaneous 3 times per day  . letrozole  2.5 mg Oral Daily  . loratadine  10 mg Oral Daily  . metronidazole  500 mg Intravenous Q8H  . multivitamin with minerals  1 tablet Oral Daily  . potassium chloride  40 mEq Oral Q3H  . simvastatin  20 mg Oral QHS  . sodium chloride  3 mL Intravenous Q12H  . vitamin B-12  1,000 mcg Oral BID  . vitamin C  500 mg Oral Daily  . vitamin E  200 Units Oral Daily   Continuous Infusions: . sodium chloride Stopped (08/03/15 [id number])    Marzetta Board, MD [facility] Pager [phone]. If 7 PM - 7 AM, please contact night-coverage at [email or web address], password [name] 08/04/2015, 12:44 PM  LOS: 2 days

## 2015-08-04 NOTE — Evaluation (Signed)
Physical Therapy Evaluation Patient Details Name: Lisa [name] MRN: [id number] DOB: Nov 11, 1940 Today's Date: 08/04/2015   History of Present Illness  75 yo female admitted with sepsis, diarrhea, weaknes.[name] Hx of chronic back pain, breast cancer, HTN  Clinical Impression  On eval, pt required Min-Mod assist for mobility-able to take a few steps in room with RW. Pain with all mobility tasks. Increased time and effort noted. At this time, do not feel pt could safely manage at home. Pt lives with her elderly mother who likely could not provide current level of care. Recommend ST rehab at SNF unless pain control and mobility improve significantly.     Follow Up Recommendations SNF;Supervision/Assistance - 24 hour    Equipment Recommendations  None recommended by PT    Recommendations for Other Services OT consult     Precautions / Restrictions Precautions Precautions: Fall Precaution Comments: logroll for comfort Restrictions Weight Bearing Restrictions: No      Mobility  Bed Mobility Overal bed mobility: Needs Assistance Bed Mobility: Rolling;Sidelying to Sit Rolling: Min assist Sidelying to sit: Mod assist       General bed mobility comments: Increased time. Assist for trunk and bil LEs.   Transfers Overall transfer level: Needs assistance Equipment used: Rolling walker (2 wheeled) Transfers: Sit to/from [facility] Sit to Stand: Min assist Stand pivot transfers: Min assist       General transfer comment: Assist to rise, stabilize, control descent, maneuver with RW. Stand pivot bed to bsc with RW. Increased time  Ambulation/Gait Ambulation/Gait assistance: Min assist Ambulation Distance (Feet): 4 Feet Assistive device: Rolling walker (2 wheeled) Gait Pattern/deviations: Step-to pattern     General Gait Details: Assist to stabilize pt and manever with RW. Limited by pain.   Stairs            Wheelchair Mobility    Modified Rankin  (Stroke Patients Only)       Balance Overall balance assessment: Needs assistance         Standing balance support: Bilateral upper extremity supported;During functional activity Standing balance-Leahy Scale: Poor                               Pertinent Vitals/Pain Pain Assessment: Faces Faces Pain Scale: Hurts whole lot Pain Location: lower abdomen, back with activity Pain Descriptors / Indicators: Sharp;Sore Pain Intervention(s): Monitored during session;Repositioned    Home Living Family/patient expects to be discharged to:: Private residence Living Arrangements: Parent   Type of Home: House Home Access: Stairs to enter Entrance Stairs-Rails: Right Entrance Stairs-Number of Steps: 4 Home Layout: One level Home Equipment: Environmental consultant - 2 wheels;Cane - single point      Prior Function Level of Independence: Independent with assistive device(s)         Comments: using walker most recently     Hand Dominance        Extremity/Trunk Assessment   Upper Extremity Assessment: Defer to OT evaluation           Lower Extremity Assessment: Generalized weakness      Cervical / Trunk Assessment: Normal  Communication   Communication: No difficulties  Cognition Arousal/Alertness: Awake/alert Behavior During Therapy: WFL for tasks assessed/performed Overall Cognitive Status: Within Functional Limits for tasks assessed                      General Comments      Exercises  Assessment/Plan    PT Assessment Patient needs continued PT services  PT Diagnosis Difficulty walking;Generalized weakness;Acute pain   PT Problem List Decreased strength;Decreased activity tolerance;Decreased balance;Pain;Decreased mobility;Decreased knowledge of use of DME  PT Treatment Interventions DME instruction;Gait training;Functional mobility training;Therapeutic activities;Patient/family education;Balance training;Therapeutic exercise   PT Goals  (Current goals can be found in the Care Plan section) Acute Rehab PT Goals Patient Stated Goal: less pain. walk. PT Goal Formulation: With patient Time For Goal Achievement: 08/18/15 Potential to Achieve Goals: Good    Frequency Min 3X/week   Barriers to discharge        Co-evaluation               End of Session   Activity Tolerance: Patient limited by pain Patient left: in chair;with call bell/phone within reach           Time: [id number] PT Time Calculation (min) (ACUTE ONLY): 36 min   Charges:   PT Evaluation $PT Eval Moderate Complexity: 1 Procedure PT Treatments $Therapeutic Activity: 8-22 mins   PT G Codes:        Weston Anna, MPT Pager: [phone]

## 2015-08-04 NOTE — Clinical Social Work Note (Signed)
Clinical Social Work Assessment  Patient Details  Name: Lisa [name] MRN: [id number] Date of Birth: Feb 04, 1941  Date of referral:  08/04/15               Reason for consult:  Facility Placement                Permission sought to share information with:  Chartered certified accountant granted to share information::  Yes, Verbal Permission Granted  Name::        Agency::     Relationship::     Contact Information:     Housing/Transportation Living arrangements for the past 2 months:  Single Family Home Source of Information:  Patient, Spouse Patient Interpreter Needed:  None Criminal Activity/Legal Involvement Pertinent to Current Situation/Hospitalization:  No - Comment as needed Significant Relationships:  Other Family Members (patient's mother, Webb Silversmith) Lives with:  Parents Do you feel safe going back to the place where you live?  No Need for family participation in patient care:  No (Coment)  Care giving concerns:  CSW received consult for SNF placement - reviewed PT evaluation recommending SNF.    Social Worker assessment / plan:  Patient was hesitant to agree to SNF, states that she, herself has never been a resident of a SNF but has had friends that have been.   Employment status:  Retired Nurse, adult PT Recommendations:  [place] / Referral to community resources:  [place]  Patient/Family's Response to care:  Patient's mother encouraged her to consider SNFs as she is 75 years old and unable to care for her.   Patient/Family's Understanding of and Emotional Response to Diagnosis, Current Treatment, and Prognosis:  Patient feels that she is no where close to discharge, did not want to have a long discussion re: SNF but would think about it.   Emotional Assessment Appearance:  Appears stated age Attitude/Demeanor/Rapport:    Affect (typically observed):    Orientation:  Oriented to  Self, Oriented to Place, Oriented to  Time, Oriented to Situation Alcohol / Substance use:    Psych involvement (Current and /or in the community):     Discharge Needs  Concerns to be addressed:    Readmission within the last 30 days:    Current discharge risk:    Barriers to Discharge:      Standley Brooking, LCSW 08/04/2015, 3:41 PM

## 2015-08-05 DIAGNOSIS — E785 Hyperlipidemia, unspecified: Secondary | ICD-10-CM

## 2015-08-05 LAB — BASIC METABOLIC PANEL
Anion gap: 8 (ref 5–15)
BUN: 27 mg/dL — ABNORMAL HIGH (ref 6–20)
CO2: 22 mmol/L (ref 22–32)
Calcium: 11.3 mg/dL — ABNORMAL HIGH (ref 8.9–10.3)
Chloride: 111 mmol/L (ref 101–111)
Creatinine, Ser: 1.39 mg/dL — ABNORMAL HIGH (ref 0.44–1.00)
GFR calc Af Amer: 43 mL/min — ABNORMAL LOW (ref >60)
GFR calc non Af Amer: 37 mL/min — ABNORMAL LOW (ref >60)
Glucose, Bld: 109 mg/dL — ABNORMAL HIGH (ref 65–99)
Potassium: 3.8 mmol/L (ref 3.5–5.1)
Sodium: 141 mmol/L (ref 135–145)

## 2015-08-05 LAB — GLUCOSE, CAPILLARY: Glucose-Capillary: 107 mg/dL — ABNORMAL HIGH (ref 65–99)

## 2015-08-05 NOTE — Clinical Social Work Note (Signed)
CSW met with pt at bedside who confirmed she'd like to go to [place], [place] upon d/c. CSW spoke with attending who confirmed that pt can be discharged on Monday to the facility. Claiborne Billings at [facility] is aware of pt's arrival on Monday.    Cindra Presume, LCSW [phone] Hospital psychiatric & 5E, 5W 32-41 Licensed Clinical Social Worker

## 2015-08-05 NOTE — Progress Notes (Signed)
PROGRESS NOTE  Lisa [name] [id number] DOB: 1941/05/30 DOA: 08/02/2015 PCP: [name], MD   HPI: Lisa [name] is a 75 y.o. female with PMH of metastatic breast cancer (S/p mastectomy), chronic back pain, hypertension, hyperlipidemia, who presents recent diarrhea and weakness.  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 Urinalysis negative. Chest x-ray did not show infiltration. CT abdomen showed possible proctitis, which might be the etiology. Blood pressure was soft, which responded to IV fluids. Currently hemodynamically stable.  Diarrhea, C diff colitis - likely due to proctitis as shown by CT scan, but need to rule out other possibilities, such as viral enteritis and C. Difficile colitis. - C diff with positive antigen and negative toxin. Will start treatment given severe diarrhea as main complaint and reason for admission - IVF as above - po vancomycin alone, GI pathogen negative, d/c cipro/flagyl - when necessary Zofran for nausea and morphine for pain  Hypercalcemia  - multifactorial due to dehydration, calcium supplements, ?malignancy - improving with fluids, continue to monitor  HLD: Last LDL was 57 on 06/29/14  - Continue home medications: Zocor  Hypokalemia - due to diarrhea, replete and monitor  Essential hypertension - hold cozarr due to renal failure  Hx of breast cancer metastasized to pleura [name]) - s/p of mastectomy. Followed up by Dr. Marko Plume. Currently on letrozole - Continue letrozole - Follow up with Dr. Marko Plume as an  outpatient  AKI  - likely multifactorial, including hypercalcemia, elevated CK, prerenal secondary to dehydration and continuation of Cozaar. No hydronephrosis on CT scan - IVF as above - improving  Diet: Diet regular Room service appropriate?: Yes; Fluid consistency:: Thin Fluids: NS DVT Prophylaxis: heparin  Code Status: DNR Family Communication: no family bedside  Disposition Plan: SNF Monday  Barriers to discharge: IV therapies  Consultants:  None   Procedures:  None    Antibiotics Ciprofloxacin 1/11 >>  Metronidazole 1/11 >> Po Vacomycin 1/13 >>   Studies  No results found.  Objective  Filed Vitals:   08/04/15 0349 08/04/15 1500 08/04/15 2124 08/05/15 0448  BP: 147/66 114/73 140/57 150/66  Pulse: 88 89 85 78  Temp: 99.2 F (37.3 C) 99.2 F (37.3 C) 98.9 F (37.2 C) 98.7 F (37.1 C)  TempSrc: Oral Oral Oral Oral  Resp: 20 20 20 18   Height:      Weight:      SpO2: 97% 98% 97% 98%    Intake/Output Summary (Last 24 hours) at 08/05/15 1158 Last data filed at 08/04/15 1900  Gross per 24 hour  Intake   1080 ml  Output      0 ml  Net   1080 ml   Filed Weights   08/03/15 1536  Weight: 82.6 kg (182 lb 1.6 oz)    Exam:  GENERAL: NAD  HEENT: no scleral icterus, PERRL  NECK: supple, no LAD  LUNGS: CTA biL, no wheezing  HEART: RRR without MRG  ABDOMEN: soft, mild tenderness to palpation midepigastric area  EXTREMITIES: no clubbing / cyanosis  NEUROLOGIC: non focal  Data Reviewed: Basic Metabolic Panel:  Recent Labs Lab 08/02/15 1743 08/03/15 0427 08/04/15 0454 08/05/15 0440  NA 136 138 141 141  K 3.9 3.3* 2.9* 3.8  CL 104 108 111 111  CO2 23 23 22 22   GLUCOSE 145* 118* 135* 109*  BUN 61* 57* 38* 27*  CREATININE 2.10* 1.67* 1.55* 1.39*  CALCIUM 14.0* 12.4* 11.6* 11.3*  MG 2.1  --   --   --   PHOS 3.3  --   --   --    Liver Function Tests:  Recent Labs Lab 08/02/15 1743 08/03/15 0427 08/04/15 0454  AST 61* 47* 36  ALT  42 43 35  ALKPHOS 104 94 97  BILITOT 0.7 0.8 0.7  PROT 6.5 5.7* 5.5*  ALBUMIN 3.2* 2.6* 2.5*    Recent Labs Lab 08/02/15 1743  LIPASE 32   CBC:  Recent Labs Lab 08/02/15 1743 08/03/15 0427 08/04/15 0454  WBC 19.3* 12.1* 9.5  NEUTROABS 15.3*  --   --   HGB 13.7 12.4 12.1  HCT 40.3 36.4 35.3*  MCV 87.0 87.1 86.7  PLT 253 229 228   Cardiac Enzymes:  Recent Labs Lab 08/02/15 1743  CKTOTAL 513*   CBG:  Recent Labs Lab 08/03/15 0748 08/04/15 0754 08/05/15 0744  GLUCAP 79 99 107*    Recent Results (from the past 240 hour(s))  Culture, blood (x 2)     Status: None (Preliminary result)   Collection Time: 08/02/15 11:20 PM  Result Value Ref Range Status   Specimen Description BLOOD RIGHT ARM  Final   Special Requests 7 ML AEROBIC BOTTLE ONLY  Final   Culture   Final    NO GROWTH 1 DAY Performed at [facility]    Report Status PENDING  Incomplete  C difficile quick scan w PCR reflex     Status: Abnormal   Collection Time: 08/04/15 10:40 AM  Result Value Ref Range Status   C Diff antigen POSITIVE (A) NEGATIVE Final   C Diff toxin NEGATIVE NEGATIVE Final   C Diff interpretation   Final    C. difficile present, but toxin not detected. This indicates colonization. In most cases, this does not require treatment. If patient has signs and symptoms consistent with colitis, consider treatment. Requires ENTERIC precautions.  Gastrointestinal Panel by PCR , Stool     Status: None   Collection Time: 08/04/15 10:40 AM  Result Value Ref Range Status   Campylobacter species NOT DETECTED NOT DETECTED Final   Plesimonas shigelloides NOT DETECTED NOT DETECTED Final   Salmonella species NOT DETECTED NOT DETECTED Final   Yersinia enterocolitica NOT DETECTED NOT DETECTED Final   Vibrio species NOT DETECTED NOT DETECTED Final   Vibrio cholerae NOT DETECTED NOT DETECTED Final   Enteroaggregative E coli (EAEC) NOT DETECTED NOT DETECTED Final   Enteropathogenic E coli  (EPEC) NOT DETECTED NOT DETECTED Final   Enterotoxigenic E coli (ETEC) NOT DETECTED NOT DETECTED Final   Shiga like toxin producing E coli (STEC) NOT DETECTED NOT DETECTED Final   E. coli O157 NOT DETECTED NOT DETECTED Final   Shigella/Enteroinvasive E coli (EIEC) NOT DETECTED NOT DETECTED Final   Cryptosporidium NOT DETECTED NOT DETECTED Final   Cyclospora cayetanensis NOT DETECTED NOT DETECTED Final   Entamoeba histolytica NOT DETECTED NOT DETECTED Final   Giardia lamblia NOT DETECTED NOT DETECTED Final   Adenovirus F40/41 NOT DETECTED NOT DETECTED Final   Astrovirus NOT DETECTED NOT DETECTED Final   Norovirus GI/GII NOT DETECTED NOT DETECTED Final   Rotavirus  A NOT DETECTED NOT DETECTED Final   Sapovirus (I, II, IV, and V) NOT DETECTED NOT DETECTED Final     Scheduled Meds: . diclofenac  1 patch Transdermal BID  . ferrous sulfate  325 mg Oral Once per day on Mon Thu  . heparin  5,000 Units Subcutaneous 3 times per day  . letrozole  2.5 mg Oral Daily  . loratadine  10 mg Oral Daily  . multivitamin with minerals  1 tablet Oral Daily  . simvastatin  20 mg Oral QHS  . sodium chloride  3 mL Intravenous Q12H  . vancomycin  125 mg Oral 4 times per day  . vitamin B-12  1,000 mcg Oral BID  . vitamin C  500 mg Oral Daily  . vitamin E  200 Units Oral Daily   Continuous Infusions:    Marzetta Board, MD [facility] Pager [phone]. If 7 PM - 7 AM, please contact night-coverage at [email or web address], password [facility] 08/05/2015, 11:58 AM  LOS: 3 days

## 2015-08-05 NOTE — Progress Notes (Signed)
OT Cancellation Note  Patient Details Name: Lisa [name] MRN: [id number] DOB: 12/27/1940   Cancelled Treatment:    Reason Eval/Treat Not Completed: OT screened, no needs identified, will sign off (Defer to SNF). Plan is for patient to discharge > SNF. No acute OT needs identified, all needs can be met in SNF. Please send text page to OT services if any questions, concerns, or with new orders: [phone] OR call office at [phone]. Thank you for the order.    Chrys Racer , MS, OTR/L, CLT Pager: [phone]  08/05/2015, 8:41 AM

## 2015-08-06 DIAGNOSIS — A0472 Enterocolitis due to Clostridium difficile, not specified as recurrent: Secondary | ICD-10-CM | POA: Diagnosis present

## 2015-08-06 LAB — BASIC METABOLIC PANEL
ANION GAP: 12 (ref 5–15)
BUN: 22 mg/dL — ABNORMAL HIGH (ref 6–20)
CHLORIDE: 106 mmol/L (ref 101–111)
CO2: 24 mmol/L (ref 22–32)
Calcium: 12.2 mg/dL — ABNORMAL HIGH (ref 8.9–10.3)
Creatinine, Ser: 1.33 mg/dL — ABNORMAL HIGH (ref 0.44–1.00)
GFR calc Af Amer: 45 mL/min — ABNORMAL LOW (ref >60)
GFR, EST NON AFRICAN AMERICAN: 39 mL/min — AB (ref >60)
GLUCOSE: 112 mg/dL — AB (ref 65–99)
POTASSIUM: 3.3 mmol/L — AB (ref 3.5–5.1)
Sodium: 142 mmol/L (ref 135–145)

## 2015-08-06 LAB — CBC
HCT: 39.4 % (ref 36.0–46.0)
HEMOGLOBIN: 13.5 g/dL (ref 12.0–15.0)
MCH: 29.5 pg (ref 26.0–34.0)
MCHC: 34.3 g/dL (ref 30.0–36.0)
MCV: 86.2 fL (ref 78.0–100.0)
PLATELETS: 245 10*3/uL (ref 150–400)
RBC: 4.57 MIL/uL (ref 3.87–5.11)
RDW: 13.1 % (ref 11.5–15.5)
WBC: 9.5 10*3/uL (ref 4.0–10.5)

## 2015-08-06 LAB — GLUCOSE, CAPILLARY: Glucose-Capillary: 105 mg/dL — ABNORMAL HIGH (ref 65–99)

## 2015-08-06 NOTE — Progress Notes (Signed)
Resumed care of patient. Agree with previous assessment. Will continue to monitor. 

## 2015-08-06 NOTE — Progress Notes (Signed)
PROGRESS NOTE  Lisa [name] [id number] DOB: 03-Jun-1941 DOA: 08/02/2015 PCP: [name], MD   HPI: Lisa [name] is a 75 y.o. female with PMH of metastatic breast cancer (S/p mastectomy), chronic back pain, hypertension, hyperlipidemia, who presents recent diarrhea and weakness.  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 Urinalysis negative. Chest x-ray did not show infiltration. CT abdomen showed possible proctitis, which might be the etiology. Blood pressure was soft, which responded to IV fluids. Currently hemodynamically stable.  Diarrhea, C diff colitis - likely due to proctitis as shown by CT scan,in the setting of C. Difficile colitis. - C diff with positive antigen and negative toxin. Will start treatment given severe diarrhea as main complaint and reason for admission. WBC on admission 19K, improved within normal limits with antibiotics.  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) - s/p of mastectomy. Followed up by Dr. Marko Plume. Currently on letrozole - Continue letrozole - Follow up with Dr. Marko Plume as an  outpatient  AKI  - likely multifactorial, including hypercalcemia, elevated CK, prerenal secondary to dehydration and continuation of Cozaar. No hydronephrosis on CT scan - improving  Diet: Diet regular Room service appropriate?: Yes; Fluid consistency:: Thin Fluids: NS DVT Prophylaxis: heparin  Code Status: DNR Family Communication: no family bedside  Disposition Plan: SNF Monday  Barriers to discharge: IV therapies  Consultants:  None   Procedures:  None    Antibiotics Ciprofloxacin 1/11 >> 1/14 Metronidazole 1/11 >> 1/14 Po Vacomycin 1/13 >>   Studies  No results found.  Objective  Filed Vitals:   08/05/15 0448 08/05/15 1321 08/05/15 2056 08/06/15 0403  BP: 150/66 138/64 149/64 163/81  Pulse: 78 80 87 79  Temp: 98.7 F (37.1 C) 98.8 F (37.1 C) 99.6 F (37.6 C) 99.5 F (37.5 C)  TempSrc: Oral Oral Oral Oral  Resp: 18 18 18 18   Height:      Weight:      SpO2: 98% 98% 97% 96%    Intake/Output Summary (Last 24 hours) at 08/06/15 1305 Last data filed at 08/06/15 [id number]  Gross per 24 hour  Intake    240 ml  Output      0 ml  Net    240 ml   Filed Weights   08/03/15 1536  Weight: 82.6 kg (182 lb 1.6 oz)    Exam:  GENERAL: NAD  HEENT: no scleral icterus, PERRL  NECK: supple, no LAD  LUNGS: CTA biL, no wheezing  HEART: RRR without MRG  ABDOMEN: soft, mild tenderness to palpation midepigastric area  EXTREMITIES: no clubbing / cyanosis  NEUROLOGIC: non focal  Data Reviewed: Basic Metabolic Panel:  Recent Labs Lab 08/02/15  1743 08/03/15 0427 08/04/15 0454 08/05/15 0440 08/06/15 0522  NA 136 138 141 141 142  K 3.9 3.3* 2.9* 3.8 3.3*  CL 104 108 111 111 106  CO2 23 23 22 22 24   GLUCOSE 145* 118* 135* 109* 112*  BUN 61* 57* 38* 27* 22*  CREATININE 2.10* 1.67* 1.55* 1.39* 1.33*  CALCIUM 14.0* 12.4* 11.6* 11.3* 12.2*  MG 2.1  --   --   --   --   PHOS 3.3  --   --   --   --    Liver Function Tests:  Recent Labs Lab  08/02/15 1743 08/03/15 0427 08/04/15 0454  AST 61* 47* 36  ALT 42 43 35  ALKPHOS 104 94 97  BILITOT 0.7 0.8 0.7  PROT 6.5 5.7* 5.5*  ALBUMIN 3.2* 2.6* 2.5*    Recent Labs Lab 08/02/15 1743  LIPASE 32   CBC:  Recent Labs Lab 08/02/15 1743 08/03/15 0427 08/04/15 0454 08/06/15 0522  WBC 19.3* 12.1* 9.5 9.5  NEUTROABS 15.3*  --   --   --   HGB 13.7 12.4 12.1 13.5  HCT 40.3 36.4 35.3* 39.4  MCV 87.0 87.1 86.7 86.2  PLT 253 229 228 245   Cardiac Enzymes:  Recent Labs Lab 08/02/15 1743  CKTOTAL 513*   CBG:  Recent Labs Lab 08/03/15 0748 08/04/15 0754 08/05/15 0744 08/06/15 0803  GLUCAP 79 99 107* 105*    Recent Results (from the past 240 hour(s))  Culture, blood (x 2)     Status: None (Preliminary result)   Collection Time: 08/02/15 11:20 PM  Result Value Ref Range Status   Specimen Description BLOOD RIGHT ARM  Final   Special Requests 7 ML AEROBIC BOTTLE ONLY  Final   Culture   Final    NO GROWTH 2 DAYS Performed at [facility]    Report Status PENDING  Incomplete  C difficile quick scan w PCR reflex     Status: Abnormal   Collection Time: 08/04/15 10:40 AM  Result Value Ref Range Status   C Diff antigen POSITIVE (A) NEGATIVE Final   C Diff toxin NEGATIVE NEGATIVE Final   C Diff interpretation   Final    C. difficile present, but toxin not detected. This indicates colonization. In most cases, this does not require treatment. If patient has signs and symptoms consistent with colitis, consider treatment. Requires ENTERIC precautions.  Gastrointestinal Panel by PCR , Stool     Status: None   Collection Time: 08/04/15 10:40 AM  Result Value Ref Range Status   Campylobacter species NOT DETECTED NOT DETECTED Final   Plesimonas shigelloides NOT DETECTED NOT DETECTED Final   Salmonella species NOT DETECTED NOT DETECTED Final   Yersinia enterocolitica NOT DETECTED NOT DETECTED Final   Vibrio species NOT DETECTED NOT DETECTED Final   Vibrio cholerae  NOT DETECTED NOT DETECTED Final   Enteroaggregative E coli (EAEC) NOT DETECTED NOT DETECTED Final   Enteropathogenic E coli (EPEC) NOT DETECTED NOT DETECTED Final   Enterotoxigenic E coli (ETEC) NOT DETECTED NOT DETECTED Final   Shiga like toxin producing E coli (STEC) NOT DETECTED NOT DETECTED Final   E. coli O157 NOT DETECTED NOT DETECTED Final   Shigella/Enteroinvasive E coli (EIEC) NOT DETECTED NOT DETECTED Final   Cryptosporidium NOT DETECTED NOT DETECTED Final   Cyclospora cayetanensis NOT DETECTED NOT DETECTED Final   Entamoeba histolytica NOT DETECTED NOT DETECTED Final   Giardia lamblia NOT DETECTED NOT DETECTED Final   Adenovirus F40/41  NOT DETECTED NOT DETECTED Final   Astrovirus NOT DETECTED NOT DETECTED Final   Norovirus GI/GII NOT DETECTED NOT DETECTED Final   Rotavirus A NOT DETECTED NOT DETECTED Final   Sapovirus (I, II, IV, and V) NOT DETECTED NOT DETECTED Final     Scheduled Meds: . diclofenac  1 patch Transdermal BID  . ferrous sulfate  325 mg Oral Once per day on Mon Thu  . heparin  5,000 Units Subcutaneous 3 times per day  . letrozole  2.5 mg Oral Daily  . loratadine  10 mg Oral Daily  . multivitamin with minerals  1 tablet Oral Daily  . simvastatin  20 mg Oral QHS  . sodium chloride  3 mL Intravenous Q12H  . vancomycin  125 mg Oral 4 times per day  . vitamin B-12  1,000 mcg Oral BID  . vitamin C  500 mg Oral Daily  . vitamin E  200 Units Oral Daily   Continuous Infusions:    Marzetta Board, MD [facility] Pager [phone]. If 7 PM - 7 AM, please contact night-coverage at [email or web address], password [facility] 08/06/2015, 1:05 PM  LOS: 4 days

## 2015-08-07 ENCOUNTER — Telehealth: Payer: Self-pay | Admitting: Oncology

## 2015-08-07 ENCOUNTER — Other Ambulatory Visit: Payer: Self-pay | Admitting: Oncology

## 2015-08-07 ENCOUNTER — Encounter: Payer: Self-pay | Admitting: Oncology

## 2015-08-07 DIAGNOSIS — C50911 Malignant neoplasm of unspecified site of right female breast: Secondary | ICD-10-CM

## 2015-08-07 DIAGNOSIS — C7951 Secondary malignant neoplasm of bone: Principal | ICD-10-CM

## 2015-08-07 DIAGNOSIS — C782 Secondary malignant neoplasm of pleura: Secondary | ICD-10-CM

## 2015-08-07 DIAGNOSIS — C50919 Malignant neoplasm of unspecified site of unspecified female breast: Secondary | ICD-10-CM | POA: Insufficient documentation

## 2015-08-07 LAB — GLUCOSE, CAPILLARY: Glucose-Capillary: 104 mg/dL — ABNORMAL HIGH (ref 65–99)

## 2015-08-07 MED ORDER — VANCOMYCIN 50 MG/ML ORAL SOLUTION: 125.0000 mg | Freq: Four times a day (QID) | ORAL | Status: DC

## 2015-08-07 MED ORDER — DICLOFENAC EPOLAMINE 1.3 % TD PTCH: 1.0000 | MEDICATED_PATCH | Freq: Two times a day (BID) | TRANSDERMAL | Status: DC

## 2015-08-07 MED ORDER — DIAZEPAM 2 MG PO TABS: 2.0000 mg | ORAL_TABLET | Freq: Four times a day (QID) | ORAL | Status: DC | PRN

## 2015-08-07 NOTE — Progress Notes (Signed)
Physical Therapy Treatment Patient Details Name: Lisa [name] MRN: [id number] DOB: 10-24-1940 Today's Date: 08/07/2015    History of Present Illness 75 yo female admitted with sepsis, diarrhea, weaknes.[name] Hx of chronic back pain, breast cancer, HTN    PT Comments    Progressing with mobility. Continue to recommend SNF to maximize independence and safety with mobility.   Follow Up Recommendations  SNF     Equipment Recommendations  None recommended by PT    Recommendations for Other Services       Precautions / Restrictions Precautions Precautions: Fall Restrictions Weight Bearing Restrictions: No    Mobility  Bed Mobility Overal bed mobility: Needs Assistance Bed Mobility: Supine to Sit Rolling: Min guard   Supine to sit: HOB elevated     General bed mobility comments: Increased time. close guard for safety  Transfers Overall transfer level: Needs assistance Equipment used: Rolling walker (2 wheeled) Transfers: Sit to/from [facility] Sit to Stand: Min assist Stand pivot transfers: Min guard       General transfer comment: small amount of assist to rise, stabilize, control descent. stand pivot, bed to bsc-close guard assist. Increased time.   Ambulation/Gait Ambulation/Gait assistance: Min assist Ambulation Distance (Feet): 100 Feet Assistive device: Rolling walker (2 wheeled) Gait Pattern/deviations: Step-through pattern;Trunk flexed     General Gait Details: intermittent assist to stabilize. followed with recliner. Pt tolerated distance well.    Stairs            Wheelchair Mobility    Modified Rankin (Stroke Patients Only)       Balance                                    Cognition Arousal/Alertness: Awake/alert Behavior During Therapy: WFL for tasks assessed/performed Overall Cognitive Status: Within Functional Limits for tasks assessed                      Exercises      General  Comments        Pertinent Vitals/Pain Pain Assessment: Faces Faces Pain Scale: Hurts little more Pain Location: lower abdomen, back Pain Intervention(s): Monitored during session;Repositioned    Home Living                      Prior Function            PT Goals (current goals can now be found in the care plan section) Progress towards PT goals: Progressing toward goals    Frequency  Min 3X/week    PT Plan Current plan remains appropriate    Co-evaluation             End of Session Equipment Utilized During Treatment: Gait belt Activity Tolerance: Patient tolerated treatment well Patient left: in chair;with call bell/phone within reach     Time: 1121-1153 PT Time Calculation (min) (ACUTE ONLY): 32 min  Charges:  $Gait Training: 8-22 mins $Therapeutic Activity: 8-22 mins                    G Codes:      Weston Anna, MPT Pager: [phone]

## 2015-08-07 NOTE — Progress Notes (Signed)
Report called to facility. Given to RN. Patient will be transferred via [place]. Pt aware of situation.

## 2015-08-07 NOTE — Progress Notes (Signed)
Medical Oncology  Confirmed with [facility] inpatient pharmacy that patient did not receive either zometa or pamidronate during recent hospitalization  L.Marko Plume MD

## 2015-08-07 NOTE — Clinical Social Work Placement (Signed)
Patient is set to discharge to [facility] SNF today. Patient & mother aware. Discharge packet given to RN, Catie. [facility] called for transport.     Raynaldo Opitz, [facility] Clinical Social Worker cell #: [phone]    CLINICAL SOCIAL WORK PLACEMENT  NOTE  Date:  08/07/2015  Patient Details  Name: Lisa [name] MRN: [id number] Date of Birth: [birth year]  Clinical Social Work is seeking post-discharge placement for this patient at the [place] level of care (*CSW will initial, date and re-position this form in  chart as items are completed):  Yes   Patient/family provided with [place] Work Department's list of facilities offering this level of care within the geographic area requested by the patient (or if unable, by the patient's family).  Yes   Patient/family informed of their freedom to choose among providers that offer the needed level of care, that participate in Medicare, Medicaid or managed care program needed by the patient, have an available bed and are willing to accept the patient.  Yes   Patient/family informed of [place]'s ownership interest in [name] and [facility], as well as of the fact that they are under no obligation to receive care at these facilities.  PASRR submitted to EDS on 08/04/15     PASRR number received on 08/04/15     Existing PASRR number confirmed on       FL2 transmitted to all facilities in geographic area requested by pt/family on 08/04/15     FL2 transmitted to all facilities within larger geographic area on       Patient informed that his/her managed care company has contracts with or will negotiate with certain facilities, including the following:        Yes   Patient/family informed of bed offers received.  Patient chooses bed at [facility]     Physician recommends and patient chooses bed at      Patient to be transferred to [facility] on 08/07/15.  Patient  to be transferred to facility by [facility]     Patient family notified on 08/07/15 of transfer.  Name of family member notified:  patient's mother     PHYSICIAN       Additional Comment:    _______________________________________________ Standley Brooking, LCSW 08/07/2015, 10:52 AM

## 2015-08-07 NOTE — Progress Notes (Signed)
Insurance authorization obtained. [facility] called for transport.    Damien Batty, LCSW [facility] Clinical Social Worker cell #: [phone]  

## 2015-08-07 NOTE — Telephone Encounter (Signed)
Medical Oncology  This MD learned today of recent admission to [facility]. Patient was discharged to [facility] earlier today [phone]).  I spoke with RN at that facility now, who will let patient know that [place] will call tomorrow to set up appointment with me at [facility] 08-10-15.  POF sent for MD/ lab/ IVF/zometa. Managed care notified for zometa.  Godfrey Pick, MD

## 2015-08-07 NOTE — Discharge Summary (Signed)
Physician Discharge Summary  Lisa [name] [id number] DOB: 06-04-1941 DOA: 08/02/2015  PCP: [name], MD  Admit date: 08/02/2015 Discharge date: 08/07/2015  Time spent: > 30 minutes  Recommendations for Outpatient Follow-up:  1. Follow up with PCP in 2-3 weeks 2. 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 female with PMH of metastatic breast cancer (S/p mastectomy), chronic back pain, hypertension, hyperlipidemia, who presents recent diarrhea and weakness.  Hospital Course:  Sepsis [facility]) - The patient was septic on admission with leukocytosis, elevated lactate and tachypnea, etiology likely GI. Urinalysis negative. Chest x-ray did not show infiltration. CT abdomen showed possible proctitis, which might be the etiology. Blood pressure was soft, which responded to IV fluids. Currently hemodynamically stable. Diarrhea, C diff colitis - likely due to proctitis as shown by CT scan,in the setting of C. Difficile colitis. C diff with positive antigen and negative toxin. Will start treatment given severe diarrhea as main complaint and reason for admission. WBC on admission 19K, improved within normal limits with antibiotics. GI pathogen negative. Hypercalcemia  - multifactorial due to dehydration, calcium supplements, ?malignancy. 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 Followed up by Dr. Marko Plume. Currently on letrozole AKI- likely multifactorial, including hypercalcemia, elevated CK, prerenal secondary to dehydration. Improved with fluids.  Procedures:  None    Consultations:  None   Discharge Exam: Filed Vitals:   08/06/15 0403 08/06/15 1509 08/06/15 2100 08/07/15 0408  BP: 163/81 137/64 150/72 158/67  Pulse: 79 89 82 80  Temp: 99.5 F (37.5 C) 98 F (36.7 C) 98.4 F (36.9 C) 98.4 F (36.9 C)  TempSrc: Oral Oral Oral Oral  Resp: 18 20 20 20   Height:      Weight:      SpO2: 96% 97% 97% 96%    General: NAD Cardiovascular: RRR Respiratory: CTA biL  Discharge Instructions Activity:  As tolerated   Get Medicines reviewed and adjusted: Please take all your medications with you for your next visit with your Primary MD  Please request your Primary MD to go over all hospital tests and procedure/radiological results at the follow up, please ask your Primary MD to get all Hospital records sent to his/her office.  If you experience worsening of your admission symptoms, develop shortness of breath, life threatening emergency, suicidal or homicidal thoughts you must seek medical attention immediately by calling 911 or calling your MD immediately if symptoms less severe.  You must read complete instructions/literature along with all the possible adverse reactions/side effects for all the Medicines you take and that have been prescribed to you. Take any new Medicines after you have completely understood and accpet all the possible adverse reactions/side effects.   Do not drive when  taking Pain medications.   Do not take more than prescribed Pain, Sleep and Anxiety Medications  Special Instructions: If you have smoked or chewed Tobacco in the last 2 yrs please stop smoking, stop any regular Alcohol and or any  Recreational drug use.  Wear Seat belts while driving.  Please note  You were cared for by a hospitalist during your hospital stay. Once you are discharged, your primary care physician will handle any further medical issues. Please note that NO REFILLS for any discharge medications will be authorized once you are discharged, as it is imperative that you return to your primary care physician (or establish a relationship with a primary care physician if you do not have one) for your aftercare needs so that they can reassess your need for medications and monitor your lab values.    Medication List    TAKE these medications        CALTRATE 600 PLUS-VIT D PO  Take 1 tablet by mouth daily.     diazepam 2 MG tablet  Commonly known as:  VALIUM  Take 1 tablet (2 mg total) by mouth every 6 (six) hours as needed for anxiety or muscle spasms.     diclofenac 1.3 % Ptch  Commonly known as:  FLECTOR  Place 1 patch onto the skin 2 (two) times daily.     EPIPEN 2-PAK 0.3 mg/0.3 mL Soaj injection  Generic drug:  EPINEPHrine  Inject 0.3 mg into the skin daily as needed (allergic reaction).     ferrous sulfate 325 (65 FE) MG tablet  Take 325 mg by mouth 2 (two) times a week. Wednesday and Saturdays, causes constipation so only takes it twice weekly     letrozole 2.5 MG tablet  Commonly known as:  FEMARA  Take 1 tablet (2.5 mg total) by mouth daily.     loratadine 10 MG tablet  Commonly known as:  CLARITIN  Take 10 mg by mouth daily.     losartan 50 MG tablet  Commonly known as:  COZAAR  Take 50 mg by mouth daily.     multivitamin with minerals tablet  Take 1 tablet by mouth daily.     simvastatin 20 MG tablet  Commonly known as:  ZOCOR  Take 20 mg by mouth at bedtime.     vancomycin 50 mg/mL oral solution  Commonly known as:  VANCOCIN  Take 2.5 mLs (125 mg total) by mouth every 6 (six) hours.     vitamin B-12 1000 MCG tablet  Commonly known as:  CYANOCOBALAMIN  Take 1,000 mcg by  mouth 2 (two) times daily.     vitamin C 500 MG tablet  Commonly known as:  ASCORBIC ACID  Take 500 mg by mouth daily.     vitamin E 200 UNIT capsule  Take 200 Units by mouth daily.         The results of significant diagnostics from this hospitalization (including imaging, microbiology, ancillary and laboratory) are listed below for reference.    Significant Diagnostic Studies: Ct Abdomen Pelvis Wo Contrast  08/02/2015  CLINICAL DATA:  Diarrhea, hypotension. EXAM: CT ABDOMEN AND PELVIS WITHOUT CONTRAST TECHNIQUE: Multidetector CT imaging of the abdomen and pelvis was performed following the standard protocol without IV contrast. COMPARISON:  CT scan of June 29, 2014. FINDINGS: Pathologic fracture of T10 vertebral body is noted. Lytic metastatic lesions are noted in the T9 and T11 vertebral bodies. Lytic metastatic lesion is noted in right iliac wing as well. Stable chronic  pleural thickening is noted in right lung base posteriorly consistent with prior pleurodesis. No gallstones are noted. No focal abnormality is noted the liver, spleen or pancreas on these unenhanced images. Adrenal glands are unremarkable. Nonobstructive right nephrolithiasis is noted. No hydronephrosis or renal obstruction is noted. No ureteral calculi are noted. The appendix appears normal. There is no evidence of bowel obstruction. Atherosclerosis of abdominal aorta is noted without aneurysm formation. No abnormal fluid collection is noted. Status post hysterectomy. Urinary bladder is decompressed. Ovaries are unremarkable. No significant adenopathy is noted. Large amount of stool is noted in the rectum concerning for impaction. There is noted mild diffuse wall thickening of the rectum concerning for proctitis. Inflammatory changes are noted in the surrounding tissues. IMPRESSION: Pathologic compression fracture of T10 vertebral body is noted. Lytic metastatic lesions are also noted in the T9 and T11 vertebral bodies, as  well as the right iliac wing. Nonobstructive right nephrolithiasis. No hydronephrosis or renal obstruction is noted. Mild diffuse wall thickening of the rectum is noted with surrounding inflammatory changes concerning for proctitis. Large amount of stool is noted in the rectum concerning for impaction. Electronically Signed   By: Marijo Conception, M.D.   On: 08/02/2015 19:53   Dg Chest 2 View  08/02/2015  CLINICAL DATA:  Weakness and low back pain.  Fell last night. EXAM: CHEST  2 VIEW COMPARISON:  04/27/2015 FINDINGS: Heart size is normal. There is atherosclerosis of the aorta. There is been previous surgery in the right chest wall and axillary region. There is chronic pulmonary scarring but no sign of active infiltrate, mass, or infiltrate. I think there is mild basilar atelectasis, particularly at the right base. Old lower thoracic compression fractures again noted. This may have progressed slightly. IMPRESSION: Chronic pulmonary scarring. Mild atelectasis at the lung bases, right more than left. Old lower thoracic compression fracture which appears to have progressed slightly. Electronically Signed   By: Nelson Chimes M.D.   On: 08/02/2015 17:42   Dg Thoracic Spine W/swimmers  07/26/2015  CLINICAL DATA:  Dorsalgia for 6 months. History of breast carcinoma. EXAM: THORACIC SPINE - 3 VIEWS COMPARISON:  Chest radiograph April 27, 2015 FINDINGS: Frontal, lateral, and swimmer's views were obtained. There is anterior wedging of the T11 vertebral body which appears slightly more pronounced compared to study from 3 months prior. No other fracture is seen. There is no spondylolisthesis. There is multilevel osteoarthritic change. There is no paraspinous lesion or erosion. IMPRESSION: Wedge compression fracture of the T11 vertebral body which appears slightly more pronounced than on study from 3 months prior. The patient has a history of breast carcinoma. Given this slight increase in wedging in this area and persistent  pain, nonemergent thoracic MR to assess for possible neoplastic involvement of the T11 vertebral body may be reasonable. There is no other fracture. No spondylolisthesis. There is multilevel osteoarthritic change. No paraspinous lesion. Electronically Signed   By: Lowella Grip III M.D.   On: 07/26/2015 14:36   Dg Lumbar Spine Complete  07/26/2015  CLINICAL DATA:  Mid and lower back pain since an injury in July (lifting a ladder). No radiation into the legs. EXAM: LUMBAR SPINE - COMPLETE 4+ VIEW COMPARISON:  04/27/2015 FINDINGS: There are small ribs at T12 followed by 4 non rib-bearing lumbar type vertebral bodies. L5 is transitional with a right-sided assimilation joint with the sacrum. Mild lumbar dextroscoliosis is unchanged. There is at most trace retrolisthesis of L3 on L4 and at most trace anterolisthesis of  L4 on L5, unchanged. Lumbar vertebral body heights are preserved without evidence of compression fracture. Moderate disc space narrowing is again seen at L4-5 greater than L5-S1, with mild narrowing at L3-4. No pars defects are identified. There is mild-to-moderate lower lumbar facet arthrosis. Small calcifications in the pelvis likely represent phleboliths. Moderate amount of colonic stool is partially visualized. 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 Electronically Signed   By: Logan Bores M.D.   On: 07/26/2015 14:37    Microbiology: Recent Results (from the past 240 hour(s))  Culture, blood (x 2)     Status: None (Preliminary result)   Collection Time: 08/02/15 11:20 PM  Result Value Ref Range Status   Specimen Description BLOOD RIGHT ARM  Final   Special Requests 7 ML AEROBIC BOTTLE ONLY  Final   Culture   Final    NO GROWTH 3 DAYS Performed at [facility]    Report Status PENDING  Incomplete  C difficile quick scan w PCR reflex     Status: Abnormal   Collection Time: 08/04/15 10:40 AM  Result Value Ref Range Status    C Diff antigen POSITIVE (A) NEGATIVE Final   C Diff toxin NEGATIVE NEGATIVE Final   C Diff interpretation   Final    C. difficile present, but toxin not detected. This indicates colonization. In most cases, this does not require treatment. If patient has signs and symptoms consistent with colitis, consider treatment. Requires ENTERIC precautions.  Gastrointestinal Panel by PCR , Stool     Status: None   Collection Time: 08/04/15 10:40 AM  Result Value Ref Range Status   Campylobacter species NOT DETECTED NOT DETECTED Final   Plesimonas shigelloides NOT DETECTED NOT DETECTED Final   Salmonella species NOT DETECTED NOT DETECTED Final   Yersinia enterocolitica NOT DETECTED NOT DETECTED Final   Vibrio species NOT DETECTED NOT DETECTED Final   Vibrio cholerae NOT DETECTED NOT DETECTED Final   Enteroaggregative E coli (EAEC) NOT DETECTED NOT DETECTED Final   Enteropathogenic E coli (EPEC) NOT DETECTED NOT DETECTED Final   Enterotoxigenic E coli (ETEC) NOT DETECTED NOT DETECTED Final   Shiga like toxin producing E coli (STEC) NOT DETECTED NOT DETECTED Final   E. coli O157 NOT DETECTED NOT DETECTED Final   Shigella/Enteroinvasive E coli (EIEC) NOT DETECTED NOT DETECTED Final   Cryptosporidium NOT DETECTED NOT DETECTED Final   Cyclospora cayetanensis NOT DETECTED NOT DETECTED Final   Entamoeba histolytica NOT DETECTED NOT DETECTED Final   Giardia lamblia NOT DETECTED NOT DETECTED Final   Adenovirus F40/41 NOT DETECTED NOT DETECTED Final   Astrovirus NOT DETECTED NOT DETECTED Final   Norovirus GI/GII NOT DETECTED NOT DETECTED Final   Rotavirus A NOT DETECTED NOT DETECTED Final   Sapovirus (I, II, IV, and V) NOT DETECTED NOT DETECTED Final     Labs: Basic Metabolic Panel:  Recent Labs Lab 08/02/15 1743 08/03/15 0427 08/04/15 0454 08/05/15 0440 08/06/15 0522  NA 136 138 141 141 142  K 3.9 3.3* 2.9* 3.8 3.3*  CL 104 108 111 111 106  CO2 23 23 22 22 24   GLUCOSE 145* 118* 135* 109* 112*    BUN 61* 57* 38* 27* 22*  CREATININE 2.10* 1.67* 1.55* 1.39* 1.33*  CALCIUM 14.0* 12.4* 11.6* 11.3* 12.2*  MG 2.1  --   --   --   --   PHOS 3.3  --   --   --   --    Liver  Function Tests:  Recent Labs Lab 08/02/15 1743 08/03/15 0427 08/04/15 0454  AST 61* 47* 36  ALT 42 43 35  ALKPHOS 104 94 97  BILITOT 0.7 0.8 0.7  PROT 6.5 5.7* 5.5*  ALBUMIN 3.2* 2.6* 2.5*    Recent Labs Lab 08/02/15 1743  LIPASE 32   CBC:  Recent Labs Lab 08/02/15 1743 08/03/15 0427 08/04/15 0454 08/06/15 0522  WBC 19.3* 12.1* 9.5 9.5  NEUTROABS 15.3*  --   --   --   HGB 13.7 12.4 12.1 13.5  HCT 40.3 36.4 35.3* 39.4  MCV 87.0 87.1 86.7 86.2  PLT 253 229 228 245   Cardiac Enzymes:  Recent Labs Lab 08/02/15 1743  CKTOTAL 513*   CBG:  Recent Labs Lab 08/03/15 0748 08/04/15 0754 08/05/15 0744 08/06/15 0803 08/07/15 0745  GLUCAP 79 99 107* 105* 104*     Signed:  Asencion Loveday  [facility] 08/07/2015, 9:36 AM

## 2015-08-08 ENCOUNTER — Encounter: Payer: Self-pay | Admitting: Adult Health

## 2015-08-08 ENCOUNTER — Telehealth: Payer: Self-pay | Admitting: Oncology

## 2015-08-08 ENCOUNTER — Non-Acute Institutional Stay (SKILLED_NURSING_FACILITY): Payer: Medicare Other | Admitting: Adult Health

## 2015-08-08 DIAGNOSIS — C50911 Malignant neoplasm of unspecified site of right female breast: Secondary | ICD-10-CM | POA: Diagnosis not present

## 2015-08-08 DIAGNOSIS — E785 Hyperlipidemia, unspecified: Secondary | ICD-10-CM | POA: Diagnosis not present

## 2015-08-08 DIAGNOSIS — I10 Essential (primary) hypertension: Secondary | ICD-10-CM | POA: Diagnosis not present

## 2015-08-08 DIAGNOSIS — J302 Other seasonal allergic rhinitis: Secondary | ICD-10-CM | POA: Diagnosis not present

## 2015-08-08 DIAGNOSIS — C782 Secondary malignant neoplasm of pleura: Secondary | ICD-10-CM

## 2015-08-08 DIAGNOSIS — A0472 Enterocolitis due to Clostridium difficile, not specified as recurrent: Secondary | ICD-10-CM

## 2015-08-08 DIAGNOSIS — A047 Enterocolitis due to Clostridium difficile: Secondary | ICD-10-CM

## 2015-08-08 DIAGNOSIS — E876 Hypokalemia: Secondary | ICD-10-CM | POA: Diagnosis not present

## 2015-08-08 LAB — CULTURE, BLOOD (ROUTINE X 2): CULTURE: NO GROWTH

## 2015-08-08 NOTE — Progress Notes (Signed)
Patient ID: Lisa [name], female   DOB: 03/16/1941, 75 y.o.   MRN: [id number]    Facility:  [facility]    PCP [name], MD    Allergies  Allergen Reactions  . Diphenhydramine Hcl Other (See Comments)    Severe headaches  . Oseltamivir Phosphate Nausea And Vomiting    REACTION: rash  . Oxycodone-Acetaminophen Nausea And Vomiting    REACTION: emesis  . Prednisone Other (See Comments)    REACTION: hiatal hernia, runny  Nose, felt terrible  . Sudafed [Pseudoephedrine Hcl]     vertigo  . Codeine Sulfate Rash    REACTION: rash    Chief Complaint  Patient presents with  . Hospitalization Follow-up    HPI:   She has been hospitalized for increased weakness; she was found to be septic with a likely GI etiology. She has been treated for c-diff and will need 12 more days of po vancomycin. Her CT of abdomen and pelvis: Pathologic compression fracture of T10 vertebral body is noted. Lytic metastatic lesions are also noted in the T9 and T11 vertebral bodies, as well as the right iliac wing. She has a history of breast cancer with mastectomy. She has a follow up with oncology on 08-10-15. She is here for short term rehab with her goal to return back home.      Past Medical History  Diagnosis Date  . Pleural effusion 11-08-2008  . Breast cancer ([place]) 07-13-1997  . Chronic back pain   . Hypertension     Past Surgical History  Procedure Laterality Date  . Abdominal hysterectomy  02-21-1982  . Pelvic laparoscopy  11-06-1981  . Dilation and curettage, diagnostic / therapeutic  12-13-1981  . Breast biopsy  07-13-1997  . Mastectomy  07-29-1997  . Thoracentesis  10-18-2008    Family History  Problem Relation Age of Onset  . Heart disease Mother     with pacemaker   . Asthma Father    Social History   Social History  . Marital Status: Single    Spouse Name: N/A  . Number of Children: N/A  . Years of Education: N/A   Occupational History  . Not on file.   Social  History Main Topics  . Smoking status: Former Smoker    Quit date: 07/22/1996  . Smokeless tobacco: Never Used  . Alcohol Use: No  . Drug Use: No  . Sexual Activity: Not on file   Other Topics Concern  . Not on file   Social History Narrative    VITAL SIGNS BP 168/97 mmHg  Pulse 86  Temp(Src) 98.6 F (37 C)  Ht 5\' 3"  (1.6 m)  Wt 182 lb (82.555 kg)  BMI 32.25 kg/m2  SpO2 98%  Patient's Medications  New Prescriptions   No medications on file  Previous Medications   CALCIUM-VITAMIN D (CALTRATE 600 PLUS-VIT D PO)    Take 1 tablet by mouth daily.    DIAZEPAM (VALIUM) 2 MG TABLET    Take 1 tablet (2 mg total) by mouth every 6 (six) hours as needed for anxiety or muscle spasms.   DICLOFENAC (FLECTOR) 1.3 % PTCH    Place 1 patch onto the skin 2 (two) times daily.   EPIPEN 2-PAK 0.3 MG/0.3ML SOAJ INJECTION    Inject 0.3 mg into the skin daily as needed (allergic reaction).    FERROUS SULFATE 325 (65 FE) MG TABLET    Take 325 mg by mouth 2 (two) times a week. Wednesday and Saturdays, causes  constipation so only takes it twice weekly   LETROZOLE (FEMARA) 2.5 MG TABLET    Take 1 tablet (2.5 mg total) by mouth daily.   LORATADINE (CLARITIN) 10 MG TABLET    Take 10 mg by mouth daily.    LOSARTAN (COZAAR) 50 MG TABLET    Take 50 mg by mouth daily.     MORPHINE (ROXANOL) 20 MG/ML CONCENTRATED SOLUTION    Take 5 mg by mouth every 2 (two) hours as needed for severe pain.   MULTIPLE VITAMINS-MINERALS (MULTIVITAMIN WITH MINERALS) TABLET    Take 1 tablet by mouth daily.     SIMVASTATIN (ZOCOR) 20 MG TABLET    Take 20 mg by mouth at bedtime.     VANCOMYCIN (VANCOCIN) 50 MG/ML ORAL SOLUTION    Take 2.5 mLs (125 mg total) by mouth every 6 (six) hours.   VITAMIN B-12 (CYANOCOBALAMIN) 1000 MCG TABLET    Take 1,000 mcg by mouth 2 (two) times daily.   VITAMIN C (ASCORBIC ACID) 500 MG TABLET    Take 500 mg by mouth daily.     VITAMIN E 200 UNIT CAPSULE    Take 200 Units by mouth daily.    Modified  Medications   No medications on file  Discontinued Medications   No medications on file     SIGNIFICANT DIAGNOSTIC EXAMS  07-26-15: thoracic spine: Wedge compression fracture of the T11 vertebral body which appears slightly more pronounced than on study from 3 months prior. The patient has a history of breast carcinoma. Given this slight increase in wedging in this area and persistent pain, nonemergent thoracic MR to assess for possible neoplastic involvement of the T11 vertebral body may be reasonable. There is no other fracture. No spondylolisthesis. There is multilevel osteoarthritic change. No paraspinous lesion.   07-26-15: lumbar spine x-ray: Moderate lower lumbar disc and facet degeneration, not significantly changed.  08-02-15: chest x-ray: Chronic pulmonary scarring. Mild atelectasis at the lung bases, right more than left. Old lower thoracic compression fracture which appears to have progressed slightly.  08-02-15: ct of abdomen and pelvis: Pathologic compression fracture of T10 vertebral body is noted. Lytic metastatic lesions are also noted in the T9 and T11 vertebral bodies, as well as the right iliac wing. Nonobstructive right nephrolithiasis. No hydronephrosis or renal obstruction is noted. Mild diffuse wall thickening of the rectum is noted with surrounding inflammatory changes concerning for proctitis. Large amount of stool is noted in the rectum concerning for impaction.    LABS REVIEWED:   08-02-15: wbc 19.3; hgb 13.7; hct 40.3; mcv87.0; plt 253; glucose 145; bun 61; creat 2.10; k+ 3.9; na++136; ast 61; albumin 3.2; ca++14.2; CK 513; PTH 8; blood culture: no growth 08-04-15: wbc 9.5; hgb 12.1; hct 35.3; mcv 86.7; plt 228; gluocse 135; bun 38; creat 1.55; k+ 2.9; na++141; liver normal albumin 2.5; c-diff: +  Ca++ 11.9 08-06-15: wbc 9.5; hgb 13.5; hct 39.4; mcv 86.2; plt 245; glucose 112; bun 22; creat 1.33; k+ 3.3; na++142; ca++12.2     Review of Systems  Constitutional:  Negative for malaise/fatigue.  Respiratory: Negative for cough and shortness of breath.   Cardiovascular: Negative for chest pain, palpitations and leg swelling.  Gastrointestinal: Negative for heartburn, abdominal pain, diarrhea and constipation.  Musculoskeletal: Positive for back pain. Negative for myalgias.  Skin: Negative.   Neurological: Negative for dizziness and headaches.  Psychiatric/Behavioral: The patient is not nervous/anxious.     Physical Exam  Constitutional: She is oriented to person, place, and time.  She appears well-developed and well-nourished. No distress.  Eyes: Conjunctivae are normal.  Neck: Neck supple. No JVD present. No thyromegaly present.  Cardiovascular: Normal rate, regular rhythm and intact distal pulses.   Respiratory: Effort normal and breath sounds normal. No respiratory distress. She has no wheezes.  GI: Soft. Bowel sounds are normal. She exhibits no distension. There is no tenderness.  Musculoskeletal: She exhibits no edema.  Able to move all extremities   Lymphadenopathy:    She has no cervical adenopathy.  Neurological: She is alert and oriented to person, place, and time.  Skin: Skin is warm and dry. She is not diaphoretic.  Psychiatric: She has a normal mood and affect.       ASSESSMENT/ PLAN:  1. Breast cancer: is status post mastectomy is on femara 2.5 mg daily is followed by oncology Dr. Marko Plume; with possible bony mets; has appointment on 08-10-15. Will monitor   2. Anemia; her hgb is 13.5 will continue iron twice weekly   3. Hypertension: will continue cozaar 50 mg daily; her reading here is high; will not make changes based upon one reading; will monitor and will make changes in the future as indicated  4. Will continue zocor 20 mg nightly   5. Allergic rhinitis: will continue claritin 10 mg daily   6. Back pain: has compression fractures: will continue therapy as directed will continue flecrot 1.3% patch twice daily will continue  roxanol 5 mg every 2 hours as needed for pain has valium 2 mg every 6 hours as needed for spasms   7. Hypercalcemia: had been on supplements; was given IVF in the hospital her calcium level improved from 14.2 to 12.2. Will monitor  8. Hypokalemia: k+ is 3.3; will repeat   9. C-diff colitis and sepsis: will complete 12 days of vancomycin therapy. Will start florastor daily and will monitor    Will check bmp in the AM   Time spent with patient  50  minutes >50% time spent counseling; reviewing medical record; tests; labs; and developing future plan of care    Ok Edwards NP [facility]  Contact [phone] Monday through Friday 8am- 5pm  After hours call [phone]

## 2015-08-08 NOTE — Telephone Encounter (Signed)
Called [facility] per 1/16 pof and they are aware of her 1/19 appointments [phone]

## 2015-08-09 ENCOUNTER — Other Ambulatory Visit: Payer: Self-pay | Admitting: Oncology

## 2015-08-10 ENCOUNTER — Encounter: Payer: Self-pay | Admitting: Internal Medicine

## 2015-08-10 ENCOUNTER — Ambulatory Visit
Admission: RE | Admit: 2015-08-10 | Discharge: 2015-08-10 | Disposition: A | Discharge status: Home / Self Care | Payer: Medicare Other | Source: Ambulatory Visit | Attending: Radiation Oncology | Admitting: Radiation Oncology

## 2015-08-10 ENCOUNTER — Ambulatory Visit (HOSPITAL_BASED_OUTPATIENT_CLINIC_OR_DEPARTMENT_OTHER): Payer: Medicare Other | Admitting: Oncology

## 2015-08-10 ENCOUNTER — Non-Acute Institutional Stay (SKILLED_NURSING_FACILITY): Payer: Medicare Other | Admitting: Internal Medicine

## 2015-08-10 ENCOUNTER — Encounter: Payer: Self-pay | Admitting: Oncology

## 2015-08-10 ENCOUNTER — Other Ambulatory Visit: Payer: Self-pay

## 2015-08-10 ENCOUNTER — Other Ambulatory Visit: Payer: Self-pay | Admitting: Oncology

## 2015-08-10 ENCOUNTER — Other Ambulatory Visit (HOSPITAL_BASED_OUTPATIENT_CLINIC_OR_DEPARTMENT_OTHER): Payer: Medicare Other

## 2015-08-10 ENCOUNTER — Encounter: Payer: Self-pay | Admitting: Radiation Oncology

## 2015-08-10 ENCOUNTER — Ambulatory Visit: Payer: Medicare Other

## 2015-08-10 VITALS — BP 125/48 | HR 85 | Temp 98.4°F | Resp 16 | Ht 63.0 in | Wt 175.5 lb

## 2015-08-10 DIAGNOSIS — G893 Neoplasm related pain (acute) (chronic): Secondary | ICD-10-CM

## 2015-08-10 DIAGNOSIS — N183 Chronic kidney disease, stage 3 unspecified: Secondary | ICD-10-CM

## 2015-08-10 DIAGNOSIS — E876 Hypokalemia: Secondary | ICD-10-CM

## 2015-08-10 DIAGNOSIS — C7951 Secondary malignant neoplasm of bone: Secondary | ICD-10-CM

## 2015-08-10 DIAGNOSIS — R634 Abnormal weight loss: Secondary | ICD-10-CM

## 2015-08-10 DIAGNOSIS — Z87891 Personal history of nicotine dependence: Secondary | ICD-10-CM | POA: Insufficient documentation

## 2015-08-10 DIAGNOSIS — N179 Acute kidney failure, unspecified: Secondary | ICD-10-CM

## 2015-08-10 DIAGNOSIS — C50911 Malignant neoplasm of unspecified site of right female breast: Secondary | ICD-10-CM

## 2015-08-10 DIAGNOSIS — A419 Sepsis, unspecified organism: Secondary | ICD-10-CM

## 2015-08-10 DIAGNOSIS — A0472 Enterocolitis due to Clostridium difficile, not specified as recurrent: Secondary | ICD-10-CM

## 2015-08-10 DIAGNOSIS — Z510 Encounter for antineoplastic radiation therapy: Secondary | ICD-10-CM | POA: Insufficient documentation

## 2015-08-10 DIAGNOSIS — C50919 Malignant neoplasm of unspecified site of unspecified female breast: Secondary | ICD-10-CM | POA: Diagnosis present

## 2015-08-10 DIAGNOSIS — E785 Hyperlipidemia, unspecified: Secondary | ICD-10-CM | POA: Diagnosis not present

## 2015-08-10 DIAGNOSIS — A047 Enterocolitis due to Clostridium difficile: Secondary | ICD-10-CM

## 2015-08-10 DIAGNOSIS — C782 Secondary malignant neoplasm of pleura: Secondary | ICD-10-CM

## 2015-08-10 DIAGNOSIS — I10 Essential (primary) hypertension: Secondary | ICD-10-CM | POA: Diagnosis not present

## 2015-08-10 DIAGNOSIS — Z170 Estrogen receptor positive status [ER+]: Secondary | ICD-10-CM | POA: Insufficient documentation

## 2015-08-10 DIAGNOSIS — E860 Dehydration: Secondary | ICD-10-CM

## 2015-08-10 LAB — UIFE/LIGHT CHAINS/TP QN, 24-HR UR
% BETA, URINE: 14.1 %
ALPHA 1 URINE: 3.6 %
ALPHA 2 UR: 12.7 %
Albumin, U: 56.1 %
FREE KAPPA/LAMBDA RATIO: 9 (ref 2.04–10.37)
FREE LAMBDA LT CHAINS, UR: 5.58 mg/L (ref 0.24–6.66)
FREE LT CHN EXCR RATE: 50.2 mg/L — AB (ref 1.35–24.19)
GAMMA GLOBULIN URINE: 13.6 %
Total Protein, Urine: 29.3 mg/dL

## 2015-08-10 LAB — COMPREHENSIVE METABOLIC PANEL
ALT: 110 U/L — AB (ref 0–55)
AST: 56 U/L — AB (ref 5–34)
Albumin: 3 g/dL — ABNORMAL LOW (ref 3.5–5.0)
Alkaline Phosphatase: 148 U/L (ref 40–150)
Anion Gap: 9 mEq/L (ref 3–11)
BUN: 20.5 mg/dL (ref 7.0–26.0)
CALCIUM: 12.4 mg/dL — AB (ref 8.4–10.4)
CHLORIDE: 106 meq/L (ref 98–109)
CO2: 23 meq/L (ref 22–29)
CREATININE: 1.3 mg/dL — AB (ref 0.6–1.1)
EGFR: 39 mL/min/{1.73_m2} — ABNORMAL LOW (ref >90)
GLUCOSE: 150 mg/dL — AB (ref 70–140)
POTASSIUM: 3.1 meq/L — AB (ref 3.5–5.1)
Sodium: 138 mEq/L (ref 136–145)
Total Bilirubin: 0.47 mg/dL (ref 0.20–1.20)
Total Protein: 6.6 g/dL (ref 6.4–8.3)

## 2015-08-10 MED ORDER — POTASSIUM CHLORIDE CRYS ER 20 MEQ PO TBCR
10.0000 meq | EXTENDED_RELEASE_TABLET | Freq: Once | ORAL | Status: DC
  Filled 2015-08-10: qty 0.5

## 2015-08-10 NOTE — Progress Notes (Signed)
Radiation Oncology         [phone] ________________________________  Initial Outpatient Consultation  Name: Lisa [name] MRN: [id number]  Date: 08/10/2015  DOB: 1941-04-01  [id number] G, MD  Lisa Levan, MD   REFERRING PHYSICIAN: Gordy Levan, MD  DIAGNOSIS: The encounter diagnosis was Breast cancer metastasized to bone, right ([place]).   HISTORY OF PRESENT ILLNESS::Lisa [name] is a 75 y.o. female who has a personal history of metastatic breast carcinoma, which is being treated with Femara under Dr. Marko [name]. Her breast carcinoma initially was T2N1, ER/PR-positive in December 1998. She was treated with a right mastectomy with node evaluation and adjuvant Adriamycin/Cytoxan followed by 5 years of tamoxifen through December 2003. Her surgeon was Dr. Johnathan [name].  In early 2010, she started feeling tired and "filling up near, not in, the lung." Breast cancer was found metastatic to pleura, hormone positive and treated with VATS sclerosis by Dr.Burney in April 2010 and on Femara also since April 2010. She has tolerated Femara well, though CA 27.29 has never normalized. She was hospitalized recently for increased weakness, diarrhea and she fell the previous day. She was found to be septic with a likely GI etiology. She has been treated for c-diff and is taking vancomycin. CT of the abdomen and pelvis on 08/02/15 show a pathologic compression fracture of the T10 vertebral body is noted. Lytic metastatic lesions are also noted in the T9 and T11 vertebral bodies, as well as the right iliac wing. The patient presents to the clinic for the consideration of radiotherapy for the management of her disease.  The patient saw Dr. Marko [name] this morning and she had an infusion of Zometa as well. She presents to the clinic with her aunt.  PREVIOUS RADIATION THERAPY: No  PAST MEDICAL HISTORY:  has a past medical history of Pleural effusion (11-08-2008); Breast cancer ([place])  (07-13-1997); Chronic back pain; and Hypertension.    PAST SURGICAL HISTORY: Past Surgical History  Procedure Laterality Date  . Abdominal hysterectomy  02-21-1982  . Pelvic laparoscopy  11-06-1981  . Dilation and curettage, diagnostic / therapeutic  12-13-1981  . Breast biopsy  07-13-1997  . Mastectomy  07-29-1997  . Thoracentesis  10-18-2008    FAMILY HISTORY: family history includes Asthma in her father; Heart disease in her mother.  SOCIAL HISTORY:  reports that she quit smoking about 19 years ago. Her smoking use included Cigarettes. She has a 15 pack-year smoking history. She has never used smokeless tobacco. She reports that she does not drink alcohol or use illicit drugs.  ALLERGIES: Diphenhydramine hcl; Oseltamivir phosphate; Oxycodone-acetaminophen; Prednisone; Sudafed; and Codeine sulfate  MEDICATIONS:  Current Outpatient Prescriptions  Medication Sig Dispense Refill  . diclofenac (FLECTOR) 1.3 % PTCH Place 1 patch onto the skin 2 (two) times daily. 30 patch 0  . ferrous sulfate 325 (65 FE) MG tablet Take 325 mg by mouth 2 (two) times a week. Wednesday and Saturdays, causes constipation so only takes it twice weekly    . letrozole (FEMARA) 2.5 MG tablet Take 1 tablet (2.5 mg total) by mouth daily. 90 tablet 1  . loratadine (CLARITIN) 10 MG tablet Take 10 mg by mouth daily.     [name] losartan (COZAAR) 50 MG tablet Take 50 mg by mouth daily.      . Multiple Vitamins-Minerals (MULTIVITAMIN WITH MINERALS) tablet Take 1 tablet by mouth daily.      . simvastatin (ZOCOR) 20 MG tablet Take 20 mg by mouth at bedtime.      [name]  vancomycin (VANCOCIN) 50 mg/mL oral solution Take 2.5 mLs (125 mg total) by mouth every 6 (six) hours. 120 mL 0  . vitamin B-12 (CYANOCOBALAMIN) 1000 MCG tablet Take 1,000 mcg by mouth 2 (two) times daily.    . vitamin C (ASCORBIC ACID) 500 MG tablet Take 500 mg by mouth daily.      . vitamin E 200 UNIT capsule Take 200 Units by mouth daily.      [name] EPIPEN 2-PAK 0.3  MG/0.3ML SOAJ injection Inject 0.3 mg into the skin daily as needed (allergic reaction). Reported on 08/10/2015     No current facility-administered medications for this encounter.   Facility-Administered Medications Ordered in Other Encounters  Medication Dose Route Frequency Provider Last Rate Last Dose  . potassium chloride SA (K-DUR,KLOR-CON) CR tablet 10 mEq  10 mEq Oral Once Lennis Marion Downer, MD        REVIEW OF SYSTEMS:  A 15 point review of systems is documented in the electronic medical record. This was obtained by the nursing staff. However, I reviewed this with the patient to discuss relevant findings and make appropriate changes.  Pertinent items are noted in HPI.   She has back pain started in July and steadily became worse. She states it radiates around the waistline. She also has back pain where one's bra strap would be located. She also has right hip pain and she attributes it to the right leg being slightly shorter than the left leg, causing her to limp. She wears a foot pad in her right shoe that stabilizers her which helps a lot.   PHYSICAL EXAM:  Vitals with BMI 08/10/2015  Height 5\' 3"   Weight 175 lbs 8 oz  BMI [id number]  Systolic 0000000  Diastolic 48  Pulse 85  Respirations 16  General: Alert and oriented, in no acute distress HEENT: Head is normocephalic. Extraocular movements are intact. Neck: Neck is supple, no palpable cervical or supraclavicular lymphadenopathy. Heart: Regular in rate and rhythm with no murmurs, rubs, or gallops. Chest: Clear to auscultation bilaterally, with no rhonchi, wheezes, or rales. Abdomen: Soft, nontender, nondistended, with no rigidity or guarding. Extremities: Mild edema in the bilateral lower extremities. Lymphatics: see Neck Exam Skin: The right chest wall area shows a mastectomy wall with no palpable or visible signs of recurrence. Musculoskeletal: She presents to the clinic in a wheelchair. Symmetric strength and muscle tone  throughout. Neurologic:  No obvious focalities. Speech is fluent. Coordination is intact. Point tenderness along the lower thoracic spine with percussion over that area.  Psychiatric: Judgment and insight are intact. Affect is appropriate.  ECOG = 2  LABORATORY DATA:  Lab Results  Component Value Date   WBC 9.5 08/06/2015   HGB 13.5 08/06/2015   HCT 39.4 08/06/2015   MCV 86.2 08/06/2015   PLT 245 08/06/2015   NEUTROABS 15.3* 08/02/2015   Lab Results  Component Value Date   NA 138 08/10/2015   K 3.1* 08/10/2015   CL 106 08/06/2015   CO2 23 08/10/2015   GLUCOSE 150* 08/10/2015   CREATININE 1.3* 08/10/2015   CALCIUM 12.4* 08/10/2015      RADIOGRAPHY: Ct Abdomen Pelvis Wo Contrast  08/02/2015  CLINICAL DATA:  Diarrhea, hypotension. EXAM: CT ABDOMEN AND PELVIS WITHOUT CONTRAST TECHNIQUE: Multidetector CT imaging of the abdomen and pelvis was performed following the standard protocol without IV contrast. COMPARISON:  CT scan of June 29, 2014. FINDINGS: Pathologic fracture of T10 vertebral body is noted. Lytic metastatic lesions are noted in the  T9 and T11 vertebral bodies. Lytic metastatic lesion is noted in right iliac wing as well. Stable chronic pleural thickening is noted in right lung base posteriorly consistent with prior pleurodesis. No gallstones are noted. No focal abnormality is noted the liver, spleen or pancreas on these unenhanced images. Adrenal glands are unremarkable. Nonobstructive right nephrolithiasis is noted. No hydronephrosis or renal obstruction is noted. No ureteral calculi are noted. The appendix appears normal. There is no evidence of bowel obstruction. Atherosclerosis of abdominal aorta is noted without aneurysm formation. No abnormal fluid collection is noted. Status post hysterectomy. Urinary bladder is decompressed. Ovaries are unremarkable. No significant adenopathy is noted. Large amount of stool is noted in the rectum concerning for impaction. There is noted  mild diffuse wall thickening of the rectum concerning for proctitis. Inflammatory changes are noted in the surrounding tissues. IMPRESSION: Pathologic compression fracture of T10 vertebral body is noted. Lytic metastatic lesions are also noted in the T9 and T11 vertebral bodies, as well as the right iliac wing. Nonobstructive right nephrolithiasis. No hydronephrosis or renal obstruction is noted. Mild diffuse wall thickening of the rectum is noted with surrounding inflammatory changes concerning for proctitis. Large amount of stool is noted in the rectum concerning for impaction. Electronically Signed   By: Marijo Conception, M.D.   On: 08/02/2015 19:53   Dg Chest 2 View  08/02/2015  CLINICAL DATA:  Weakness and low back pain.  Fell last night. EXAM: CHEST  2 VIEW COMPARISON:  04/27/2015 FINDINGS: Heart size is normal. There is atherosclerosis of the aorta. There is been previous surgery in the right chest wall and axillary region. There is chronic pulmonary scarring but no sign of active infiltrate, mass, or infiltrate. I think there is mild basilar atelectasis, particularly at the right base. Old lower thoracic compression fractures again noted. This may have progressed slightly. IMPRESSION: Chronic pulmonary scarring. Mild atelectasis at the lung bases, right more than left. Old lower thoracic compression fracture which appears to have progressed slightly. Electronically Signed   By: Nelson Chimes M.D.   On: 08/02/2015 17:42   Dg Thoracic Spine W/swimmers  07/26/2015  CLINICAL DATA:  Dorsalgia for 6 months. History of breast carcinoma. EXAM: THORACIC SPINE - 3 VIEWS COMPARISON:  Chest radiograph April 27, 2015 FINDINGS: Frontal, lateral, and swimmer's views were obtained. There is anterior wedging of the T11 vertebral body which appears slightly more pronounced compared to study from 3 months prior. No other fracture is seen. There is no spondylolisthesis. There is multilevel osteoarthritic change. There is no  paraspinous lesion or erosion. IMPRESSION: Wedge compression fracture of the T11 vertebral body which appears slightly more pronounced than on study from 3 months prior. The patient has a history of breast carcinoma. Given this slight increase in wedging in this area and persistent pain, nonemergent thoracic MR to assess for possible neoplastic involvement of the T11 vertebral body may be reasonable. There is no other fracture. No spondylolisthesis. There is multilevel osteoarthritic change. No paraspinous lesion. Electronically Signed   By: Lowella Grip III M.D.   On: 07/26/2015 14:36   Dg Lumbar Spine Complete  07/26/2015  CLINICAL DATA:  Mid and lower back pain since an injury in July (lifting a ladder). No radiation into the legs. EXAM: LUMBAR SPINE - COMPLETE 4+ VIEW COMPARISON:  04/27/2015 FINDINGS: There are small ribs at T12 followed by 4 non rib-bearing lumbar type vertebral bodies. L5 is transitional with a right-sided assimilation joint with the sacrum. Mild lumbar dextroscoliosis  is unchanged. There is at most trace retrolisthesis of L3 on L4 and at most trace anterolisthesis of L4 on L5, unchanged. Lumbar vertebral body heights are preserved without evidence of compression fracture. Moderate disc space narrowing is again seen at L4-5 greater than L5-S1, with mild narrowing at L3-4. No pars defects are identified. There is mild-to-moderate lower lumbar facet arthrosis. Small calcifications in the pelvis likely represent phleboliths. Moderate amount of colonic stool is partially visualized. 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 Electronically Signed   By: Logan Bores M.D.   On: 07/26/2015 14:37      IMPRESSION: Metastatic breast cancer with osseous metastasis. The patient is a good candidate for palliative radiation to the lower thoracic spine.  PLAN: I spoke to the patient today regarding her diagnosis and options for treatment. We  discussed that radiation to her lower thoracic spine would be palliative in nature. We discussed the process of CT simulation and the placement tattoos. I provided her a handout on CT simulation. We discussed 2-3 weeks of treatment as an outpatient.  We discussed possible side effects, including but not limited to; skin redness, fatigue, permanent skin darkening, or difficulty swallowing. The patient signed a consent form and this was placed in her medical chart.  A PET scan and an MRI of the spine have been ordered by Dr. Marko [name]. CT simulation is scheduled on Monday, January 23, at [facility].     ------------------------------------------------  Blair Promise, PhD, MD  This document serves as a record of services personally performed by Gery Pray, MD. It was created on his behalf by Darcus Austin, a trained medical scribe. The creation of this record is based on the scribe's personal observations and the provider's statements to them. This document has been checked and approved by the attending provider.

## 2015-08-10 NOTE — Progress Notes (Signed)
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 She also has had symptomatic hypercalcemia. She has been on Femara since April 2010, and was last seen by medical oncology 4 months ago.  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 Plan was to have outpatient MRI T spine, however patient then fell at home, where she lives with 67 year old mother, and remained on floor overnight prior to calling 911 on 08-02-15 AM. She was hospitalized from 08-02-15 thru 08-07-15, with criteria for sepsis on admission and C difficile colitis documented (C diff antigen + , toxin - but significant diarrhea). . CT AP on admission showed pathologic fracture of T10 vertebral body with lytic lesions in T9 and T11 vertebral bodies and lytic lesion in right iliac wing; she had stable pleural thickening right lung base, liver unremarkable and no other acute problems. She was treated with IVF and for the C diff, and DC to SNF on oral vancomycin.  This MD had been away during period of her hospitalization, that information available to me late 08-07-15 and arrangements made for evaluation now.  Medications from SNF include diclofenac patch and morphine 20 mg/ml  5 mg every 2 hrs prn. Patient reports back pain unless she is very careful with positioning in chair, and discomfort preventing her from sleeping in bed (no recliner in room). She denies weakness or numbness in LE, tho PTA she had difficulty getting up from seated position. Stools are still loose, "black" and some incontinence, apparently still  multiple stools daily. She denies HA, confusion, other neurologic symptoms. She denies increased SOB. She is eating and drinkng some fluids, denies excessive thirst. Physical therapy is working with her at SNF, and I have cautioned patient/ sent written order to SNF that PT should be gentle and focused on safe transfers and safe minimal mobility given pathologic fracture in T spine. Denies fever, chills, bleeding, LE swelling, urinary incontinence. Remainder of 10 point Review of Systems negative.  No PAC Declined flu vaccine  Ivonne's 73 yo uncle, husband of this aunt, is assisting her 11 yo mother.  ONCOLOGIC HISTORY The right breast carcinoma initially was T2 N1, ER/PR-positive in December 1998, treated with mastectomy with 12 node axillary evaluation and adjuvant Adriamycin/Cytoxan followed by 5 years of tamoxifen through December 2003. 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 CA 2729 was 434 in April 2010. She has tolerated Femara well and clinically has continued to do very well, though CA 27.29 has never normalized. 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    Objective:  Vital signs in last 24 hours:  BP 125/48 mmHg  Pulse 85  Temp(Src) 98.4 F (36.9 C) (Oral)  Resp 16  Ht 5' 3"  (1.6 m)  Wt 175 lb 8 oz (79.606 kg)  BMI 31.10 kg/m2  SpO2 97% Weight down from 190 in Sept 2016. Alert, oriented, history from patient  not entirely clear tho she is very clear about some details. Looks moderately uncomfortable seated in WC, has obviously lost weight, respirations not labored RA. Aunt very supportive.  HEENT:PERRL, sclerae not icteric. Oral mucosa a little dry without lesions, posterior pharynx clear.  Neck supple. No JVD.  Lymphatics:no cervical,supraclavicular adenopathy Resp: clear  to auscultation bilaterally  Cardio: regular rate and rhythm. No gallop. GI: soft, nontender, not distended. Normally active bowel sounds.  Musculoskeletal/ Extremities: UE, LE without pitting edema, cords, tenderness. Strength 2+ symmetrical in legs on exam in WC. Neuro: Moves all extremities easily, sensation intact. Speech fluent. CN intact. Cerebellar intact with activity seated in WC. Mental status seems a little less sharp than baseline Skin without rash, ecchymosis, petechiae  Lab Results:  Results for orders placed or performed in visit on 08/10/15  Comprehensive metabolic panel  Result Value Ref Range   Sodium 138 136 - 145 mEq/L   Potassium 3.1 (L) 3.5 - 5.1 mEq/L   Chloride 106 98 - 109 mEq/L   CO2 23 22 - 29 mEq/L   Glucose 150 (H) 70 - 140 mg/dl   BUN 20.5 7.0 - 26.0 mg/dL   Creatinine 1.3 (H) 0.6 - 1.1 mg/dL   Total Bilirubin 0.47 0.20 - 1.20 mg/dL   Alkaline Phosphatase 148 40 - 150 U/L   AST 56 (H) 5 - 34 U/L   ALT 110 (H) 0 - 55 U/L   Total Protein 6.6 6.4 - 8.3 g/dL   Albumin 3.0 (L) 3.5 - 5.0 g/dL   Calcium 12.4 (H) 8.4 - 10.4 mg/dL   Anion Gap 9 3 - 11 mEq/L   EGFR 39 (L) >90 ml/min/1.73 m2    Marker available after visit: CA 2729 by new methodology 226, and by prior method 232, this having been 262 in Sept and in similar range back months prior   Studies/Results: EXAM: CT ABDOMEN AND PELVIS WITHOUT CONTRAST 08-02-15  TECHNIQUE: Multidetector CT imaging of the abdomen and pelvis was performed following the standard protocol without IV contrast.  COMPARISON: CT scan of June 29, 2014.  FINDINGS: Pathologic fracture of T10 vertebral body is noted. Lytic metastatic lesions are noted in the T9 and T11 vertebral bodies. Lytic metastatic lesion is noted in right iliac wing as well. Stable chronic pleural thickening is noted in right lung base posteriorly consistent with prior pleurodesis.  No gallstones are noted. No focal abnormality is noted the  liver, spleen or pancreas on these unenhanced images. Adrenal glands are unremarkable. Nonobstructive right nephrolithiasis is noted. No hydronephrosis or renal obstruction is noted. No ureteral calculi are noted. The appendix appears normal. There is no evidence of bowel obstruction. Atherosclerosis of abdominal aorta is noted without aneurysm formation. No abnormal fluid collection is noted. Status post hysterectomy. Urinary bladder is decompressed. Ovaries are unremarkable. No significant adenopathy is noted. Large amount of stool is noted in the rectum concerning for impaction. There is noted mild diffuse wall thickening of the rectum concerning for proctitis. Inflammatory changes are noted in the surrounding tissues.  IMPRESSION: Pathologic compression fracture of T10 vertebral body is noted. Lytic metastatic lesions are also noted in the T9 and T11 vertebral bodies, as well as the right iliac wing.  Nonobstructive right nephrolithiasis. No hydronephrosis or renal obstruction is noted.  Mild diffuse wall thickening of the rectum is noted with surrounding inflammatory changes concerning for proctitis. Large amount of stool is noted in the rectum concerning for impaction.    CHEST 2 VIEW  08-02-15  COMPARISON: 04/27/2015  FINDINGS: Heart size is normal. There is atherosclerosis of the aorta. There is been previous surgery in the right chest wall and axillary region. There is chronic pulmonary scarring but no sign of active infiltrate, mass, or infiltrate. I think there is mild basilar atelectasis, particularly at the right base. Old lower thoracic compression fractures again noted. This may have progressed slightly.  IMPRESSION: Chronic pulmonary scarring. Mild atelectasis at the lung bases, right more than left.  Old lower thoracic compression fracture which appears to have progressed slightly    EXAM: LUMBAR SPINE - COMPLETE 4+ VIEW   07-26-15  COMPARISON: 04/27/2015  FINDINGS: There are small ribs at T12 followed by 4 non rib-bearing lumbar type vertebral bodies. L5 is transitional with a right-sided assimilation joint with the sacrum.  Mild lumbar dextroscoliosis is unchanged. There is at most trace retrolisthesis of L3 on L4 and at most trace anterolisthesis of L4 on L5, unchanged. Lumbar vertebral body heights are preserved without evidence of compression fracture. Moderate disc space narrowing is again seen at L4-5 greater than L5-S1, with mild narrowing at L3-4. No pars defects are identified. There is mild-to-moderate lower lumbar facet arthrosis. Small calcifications in the pelvis likely represent phleboliths. Moderate amount of colonic stool is partially visualized.  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   THORACIC SPINE - 3 VIEWS  07-26-15  COMPARISON: Chest radiograph April 27, 2015  FINDINGS: Frontal, lateral, and swimmer's views were obtained. There is anterior wedging of the T11 vertebral body which appears slightly more pronounced compared to study from 3 months prior. No other fracture is seen. There is no spondylolisthesis. There is multilevel osteoarthritic change. There is no paraspinous lesion or erosion.  IMPRESSION: Wedge compression fracture of the T11 vertebral body which appears slightly more pronounced than on study from 3 months prior. The patient has a history of breast carcinoma. Given this slight increase in wedging in this area and persistent pain, nonemergent thoracic MR to assess for possible neoplastic involvement of the T11 vertebral body may be reasonable.  There is no other fracture. No spondylolisthesis. There is multilevel osteoarthritic change. No paraspinous lesion.     Medications: I have reviewed the patient's current medications.  Hospital pharmacist confirmed no IV bisphosphonate in hospital.   Discussed dosing of zometa with [facility] pharmacist, 4 mg for hypercalcemia but dose adjust down due to renal funciton if using only for metastatic bone involvement without hypercalcemia. Patient declines other pain medication now.  DISCUSSION I have reviewed major findings of recent evaluation and history as above with patient and her family member.  I have explained that imaging is consistent with progression of metastatic breast cancer to bone, with involvement of T spine causing pathologic fracture at T10, as well as involvement in right iliac bone. She has hypercalcemia, with corrected calcium ~ 13, this likely from metastatic breast cancer and may be worsened with dehydration from diarrhea (and on calcium supplement). She understands that she had a very long stable disease interval on Femara, however it was expected that the malignancy would eventually progress. She will stop Femara and we will make decision as to another hormone blocker or other treatment after rest of restaging. She will have zometa with IVF (intially set up for today, however Dr Sondra Come has very kindly been able to work her in as urgent new patient this afternoon, so will give the IVs at [facility] on 08-11-15).  I have requested MRI T spine and PET.  I have not ordered imaging of head, but would consider that if other concerns.   She needs to continue oral vancomycin for the C diff.   Single dose of oral potassium ordered for K 3.1 today, then follow due to renal function.  Per my conversation directly with radiation oncology now, patient is to be seen by Dr Sondra Come this afternoon, their assistance much appreciated.  Assessment/Plan:  1.  hormone positive breast cancer, metastatic to pleura 2010  post VATS pleurodesis and long stable course on Femara, now progression in bone and hypercalcemia. She will see Dr Sondra Come urgently today, will have zometa following IVF here on 08-11-15 and will have MRI T spine and PET as soon as possible.   2.Diarrhea: apparently some better than in hospital but not resolved, being treated for C diff based on diarrhea and + antigen, with toxin negative (and other stool studies negative). Note diarrhea is occurring even with hypercalcemia. 3 new renal insufficiency, EGFR 39 for CKD 3: baseline creatinine ~ 0.9, this likely with hypercalcemia, diarrhea, some dehydration. IVF prior to zometa, push po fluids. 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 It is possible that some of the joint symptoms are related to AI, tho not new overall 5.environmental allergies on desensitization shots by Dr Donneta Romberg 6. Weight loss 15 lbs since 03-2015 on our scales. 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 Time spent 45 min  Including >50% counseling and coordination of care. Zometa and IVF orders done, preauthorization obtained I will see her again next week with labs. .cc Dr Candace Gallus, MD   08/10/2015, 3:56 PM

## 2015-08-10 NOTE — Progress Notes (Signed)
MRN: [id number] Name: Lisa [name]  Sex: female Age: 75 y.o. DOB: [birth year]  [place] #: Karren Burly Facility/Room:120 Level Of Care: SNF Provider: Inocencio Homes D Emergency Contacts: Extended Emergency Contact Information Primary Emergency Contact: Rosebud Poles Address: [place], [place] Phone: [phone] Relation: Mother  Code Status:   Allergies: Diphenhydramine hcl; Oseltamivir phosphate; Oxycodone-acetaminophen; Prednisone; Sudafed; and Codeine sulfate  Chief Complaint  Patient presents with  . New Admit To SNF    HPI: Patient is 75 y.o. female whowith PMH of metastatic breast cancer (S/p mastectomy), chronic back pain, hypertension, hyperlipidemia, who presents recent diarrhea and weakness. Pt was admitted to [facility] from 1/11-16 where she was treated for sepsis, likely due to C diff. Hospital course was complicated by hypercalcemia, hypokalemia and AKI. Pt is admitted to SNF for generalized weakness. While at SNF pt will be followed for metastatic breast CA, tx with femara, HTN, tx with cozaar and HLD, tx with zocor.  Past Medical History  Diagnosis Date  . Pleural effusion 11-08-2008  . Breast cancer ([place]) 07-13-1997    right  . Chronic back pain   . Hypertension     Past Surgical History  Procedure Laterality Date  . Abdominal hysterectomy  02-21-1982  . Pelvic laparoscopy  11-06-1981  . Dilation and curettage, diagnostic / therapeutic  12-13-1981  . Breast biopsy  07-13-1997  . Mastectomy  07-29-1997  . Thoracentesis  10-18-2008      Medication List       This list is accurate as of: 08/10/15 11:59 PM.  Always use your most recent med list.               diclofenac 1.3 % Ptch  Commonly known as:  FLECTOR  Place 1 patch onto the skin 2 (two) times daily.     EPIPEN 2-PAK 0.3 mg/0.3 mL Soaj injection  Generic drug:  EPINEPHrine  Inject 0.3 mg into the skin daily as needed (allergic reaction). Reported  on 08/10/2015     ferrous sulfate 325 (65 FE) MG tablet  Take 325 mg by mouth 2 (two) times a week. Wednesday and Saturdays, causes constipation so only takes it twice weekly     letrozole 2.5 MG tablet  Commonly known as:  FEMARA  Take 1 tablet (2.5 mg total) by mouth daily.     loratadine 10 MG tablet  Commonly known as:  CLARITIN  Take 10 mg by mouth daily.     losartan 50 MG tablet  Commonly known as:  COZAAR  Take 50 mg by mouth daily.     multivitamin with minerals tablet  Take 1 tablet by mouth daily.     simvastatin 20 MG tablet  Commonly known as:  ZOCOR  Take 20 mg by mouth at bedtime.     vancomycin 50 mg/mL oral solution  Commonly known as:  VANCOCIN  Take 2.5 mLs (125 mg total) by mouth every 6 (six) hours.     vitamin B-12 1000 MCG tablet  Commonly known as:  CYANOCOBALAMIN  Take 1,000 mcg by mouth 2 (two) times daily.     vitamin C 500 MG tablet  Commonly known as:  ASCORBIC ACID  Take 500 mg by mouth daily.     vitamin E 200 UNIT capsule  Take 200 Units by mouth daily.        No orders of the defined types were placed in this encounter.  Immunization History  Administered Date(s) Administered  . Pneumococcal-Unspecified 04/20/2014  . Tdap 02/03/2008    Social History  Substance Use Topics  . Smoking status: Former Smoker -- 0.50 packs/day for 30 years    Types: Cigarettes    Quit date: 07/22/1996  . Smokeless tobacco: Never Used  . Alcohol Use: No    Family history is+ HD,asthma    Review of Systems  DATA OBTAINED: from patient, nurse GENERAL:  no fevers, fatigue, appetite changes SKIN: No itching, rash or wounds EYES: No eye pain, redness, discharge EARS: No earache, tinnitus, change in hearing NOSE: No congestion, drainage or bleeding  MOUTH/THROAT: No mouth or tooth pain, No sore throat RESPIRATORY: No cough, wheezing, SOB CARDIAC: No chest pain, palpitations, lower extremity edema  GI: No abdominal pain, No N/V/D or  constipation, No heartburn or reflux  GU: No dysuria, frequency or urgency, or incontinence  MUSCULOSKELETAL: No unrelieved bone/joint pain NEUROLOGIC: No headache, dizziness or focal weakness PSYCHIATRIC: No c/o anxiety or sadness   Filed Vitals:   08/14/15 1417  BP: 133/80  Pulse: 81  Temp: 96.8 F (36 C)  Resp: 20    SpO2 Readings from Last 1 Encounters:  08/11/15 100%        Physical Exam  GENERAL APPEARANCE: Alert, conversant,  No acute distress.  SKIN: No diaphoresis rash HEAD: Normocephalic, atraumatic  EYES: Conjunctiva/lids clear. Pupils round, reactive. EOMs intact.  EARS: External exam WNL, canals clear. Hearing grossly normal.  NOSE: No deformity or discharge.  MOUTH/THROAT: Lips w/o lesions  RESPIRATORY: Breathing is even, unlabored. Lung sounds are clear   CARDIOVASCULAR: Heart RRR no murmurs, rubs or gallops. No peripheral edema.   GASTROINTESTINAL: Abdomen is soft, non-tender, not distended w/ normal bowel sounds. GENITOURINARY: Bladder non tender, not distended  MUSCULOSKELETAL: No abnormal joints or musculature NEUROLOGIC:  Cranial nerves 2-12 grossly intact. Moves all extremities  PSYCHIATRIC: Mood and affect appropriate to situation, no behavioral issues  Patient Active Problem List   Diagnosis Date Noted  . Dehydration 08/12/2015  . C. difficile colitis 08/12/2015  . CKD (chronic kidney disease) stage 3, GFR 30-59 ml/min 08/12/2015  . Unintentional weight loss 08/12/2015  . Cancer associated pain 08/12/2015  . Hypokalemia 08/08/2015  . Breast cancer metastasized to bone ([place]) 08/07/2015  . Enteritis due to Clostridium difficile 08/06/2015  . Sepsis ([place]) 08/02/2015  . Diarrhea 08/02/2015  . AKI (acute kidney injury) ([place]) 08/02/2015  . Hypercalcemia 08/02/2015  . Acute kidney injury ([place])   . Bone metastases ([place])   . 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 PLEURAL EFFUSION 10/13/2008  . ACUTE BRONCHOSPASM 06/27/2008  .  OSTEOPENIA 02/03/2008  . UNS ADVRS EFF UNS RX MEDICINAL&BIOLOGICAL SBSTNC 01/04/2008  . HLD (hyperlipidemia) 07/24/2007  . Essential hypertension 07/24/2007  . Allergic rhinitis 07/24/2007  . BREAST CANCER, HX OF 07/24/2007    CBC    Component Value Date/Time   WBC 9.5 08/06/2015 0522   WBC 5.1 03/24/2015 0926   RBC 4.57 08/06/2015 0522   RBC 4.58 03/24/2015 0926   HGB 13.5 08/06/2015 0522   HGB 13.8 03/24/2015 0926   HCT 39.4 08/06/2015 0522   HCT 39.5 03/24/2015 0926   PLT 245 08/06/2015 0522   PLT 194 03/24/2015 0926   MCV 86.2 08/06/2015 0522   MCV 86.2 03/24/2015 0926   LYMPHSABS 1.6 08/02/2015 1743   LYMPHSABS 1.5 03/24/2015 0926   MONOABS 2.4* 08/02/2015 1743   MONOABS 0.6 03/24/2015 0926   EOSABS 0.0 08/02/2015  1743   EOSABS 0.3 03/24/2015 0926   BASOSABS 0.0 08/02/2015 1743   BASOSABS 0.0 03/24/2015 0926    CMP     Component Value Date/Time   NA 138 08/10/2015 1133   NA 142 08/06/2015 0522   NA 144 12/30/2011 0800   K 3.1* 08/10/2015 1133   K 3.3* 08/06/2015 0522   K 4.5 12/30/2011 0800   CL 106 08/06/2015 0522   CL 108* 01/13/2013 0806   CL 106 12/30/2011 0800   CO2 23 08/10/2015 1133   CO2 24 08/06/2015 0522   CO2 28 12/30/2011 0800   GLUCOSE 150* 08/10/2015 1133   GLUCOSE 112* 08/06/2015 0522   GLUCOSE 101* 01/13/2013 0806   GLUCOSE 108 12/30/2011 0800   BUN 20.5 08/10/2015 1133   BUN 22* 08/06/2015 0522   BUN 22 12/30/2011 0800   CREATININE 1.3* 08/10/2015 1133   CREATININE 1.33* 08/06/2015 0522   CREATININE 0.5* 12/30/2011 0800   CALCIUM 12.4* 08/10/2015 1133   CALCIUM 12.2* 08/06/2015 0522   CALCIUM 9.5 12/30/2011 0800   CALCIUM 10.0 12/07/2008 1203   PROT 6.6 08/10/2015 1133   PROT 5.5* 08/04/2015 0454   PROT 7.5 12/30/2011 0800   ALBUMIN 3.0* 08/10/2015 1133   ALBUMIN 2.5* 08/04/2015 0454   ALBUMIN 3.8 12/30/2011 0800   AST 56* 08/10/2015 1133   AST 36 08/04/2015 0454   AST 25 12/30/2011 0800   ALT 110* 08/10/2015 1133   ALT 35  08/04/2015 0454   ALT 28 12/30/2011 0800   ALKPHOS 148 08/10/2015 1133   ALKPHOS 97 08/04/2015 0454   ALKPHOS 57 12/30/2011 0800   BILITOT 0.47 08/10/2015 1133   BILITOT 0.7 08/04/2015 0454   BILITOT 1.00 12/30/2011 0800   GFRNONAA 39* 08/06/2015 0522   GFRAA 45* 08/06/2015 0522    No results found for: HGBA1C   Ct Abdomen Pelvis Wo Contrast  08/02/2015  CLINICAL DATA:  Diarrhea, hypotension. EXAM: CT ABDOMEN AND PELVIS WITHOUT CONTRAST TECHNIQUE: Multidetector CT imaging of the abdomen and pelvis was performed following the standard protocol without IV contrast. COMPARISON:  CT scan of June 29, 2014. FINDINGS: Pathologic fracture of T10 vertebral body is noted. Lytic metastatic lesions are noted in the T9 and T11 vertebral bodies. Lytic metastatic lesion is noted in right iliac wing as well. Stable chronic pleural thickening is noted in right lung base posteriorly consistent with prior pleurodesis. No gallstones are noted. No focal abnormality is noted the liver, spleen or pancreas on these unenhanced images. Adrenal glands are unremarkable. Nonobstructive right nephrolithiasis is noted. No hydronephrosis or renal obstruction is noted. No ureteral calculi are noted. The appendix appears normal. There is no evidence of bowel obstruction. Atherosclerosis of abdominal aorta is noted without aneurysm formation. No abnormal fluid collection is noted. Status post hysterectomy. Urinary bladder is decompressed. Ovaries are unremarkable. No significant adenopathy is noted. Large amount of stool is noted in the rectum concerning for impaction. There is noted mild diffuse wall thickening of the rectum concerning for proctitis. Inflammatory changes are noted in the surrounding tissues. IMPRESSION: Pathologic compression fracture of T10 vertebral body is noted. Lytic metastatic lesions are also noted in the T9 and T11 vertebral bodies, as well as the right iliac wing. Nonobstructive right nephrolithiasis. No  hydronephrosis or renal obstruction is noted. Mild diffuse wall thickening of the rectum is noted with surrounding inflammatory changes concerning for proctitis. Large amount of stool is noted in the rectum concerning for impaction. Electronically Signed   By: Sabino Dick  Brooke Bonito, M.D.   On: 08/02/2015 19:53   Dg Chest 2 View  08/02/2015  CLINICAL DATA:  Weakness and low back pain.  Fell last night. EXAM: CHEST  2 VIEW COMPARISON:  04/27/2015 FINDINGS: Heart size is normal. There is atherosclerosis of the aorta. There is been previous surgery in the right chest wall and axillary region. There is chronic pulmonary scarring but no sign of active infiltrate, mass, or infiltrate. I think there is mild basilar atelectasis, particularly at the right base. Old lower thoracic compression fractures again noted. This may have progressed slightly. IMPRESSION: Chronic pulmonary scarring. Mild atelectasis at the lung bases, right more than left. Old lower thoracic compression fracture which appears to have progressed slightly. Electronically Signed   By: Nelson Chimes M.D.   On: 08/02/2015 17:42    Not all labs, radiology exams or other studies done during hospitalization come through on my EPIC note; however they are reviewed by me.    Assessment and Plan  Sepsis ([place]) The patient was septic on admission with leukocytosis, elevated lactate and tachypnea, etiology likely GI. Urinalysis negative. Chest x-ray did not show infiltration. CT abdomen showed possible proctitis, which might be the etiology. Blood pressure was soft, which responded to IV fluids. Currently hemodynamically stable. SNF - admit for generalized weakness for OT/PT  C. difficile colitis likely due to proctitis as shown by CT scan,in the setting of C. Difficile colitis. C diff with positive antigen and negative toxin. Will start treatment given severe diarrhea as main complaint and reason for admission. WBC on admission 19K, improved within normal  limits with antibiotics. GI pathogen negative.SNF - cont po vancomycin for 12 more days  Hypercalcemia multifactorial due to dehydration, calcium supplements, ?malignancy. Improved with fluids; SNF - follow with BMP  Hypokalemia SNF - 2/2 diarrhea, repleted;will follow with BMP  HLD (hyperlipidemia) SNF - last LDL -57;cont zocor 20 mg daily  Breast cancer metastasized to bone [facility]) And metastasized to pleura  - s/p of mastectomy. Followed up by Dr. Marko Plume.SNF - cont letrozole  Acute kidney injury ([place]) - likely multifactorial, including hypercalcemia, elevated CK, prerenal secondary to dehydration. Improved with fluids.SNF - will follow with BMP   Essential hypertension SNF - controlled on cozaar 50 mg ; continue   Time spent > 45 min;> 50% of time with patient was spent reviewing records, labs, tests and studies, counseling and developing plan of care  Hennie Duos, MD

## 2015-08-10 NOTE — Progress Notes (Signed)
Histology and Location of Primary Cancer: right breast carcinoma   Location(s) of Symptomatic Metastases CT scan from 08/01/14 showed "pathologic compression fracture of T10 vertebral body is noted. Lytic metastatic lesions are also noted in the T9 and T11 vertebral bodies, as well as the right iliac wing."   Past/Anticipated chemotherapy by medical oncology, if any: Per Dr. Marko Plume - patient has had "adjuvant Adriamycin/Cytoxan followed by 5 years of tamoxifen through December 2003. The breast cancer was found metastatic to pleura, hormone positive in early 2010, treated with VATS sclerosis by Dr.Burney April 2010 and on Femara also since April 2010."  Pain on a scale of 0-10 is: 7/10   If Spine Met(s), symptoms, if any, include:  Bowel/Bladder retention or incontinence (please describe): just had C Diff.  Denies having bladder issues.  Numbness or weakness in extremities (please describe): no  Current Decadron regimen, if applicable: no  Ambulatory status? Walker? Wheelchair?: Uses a walker.  She is living a rehab facility now.  She is in a wheelchair today.  SAFETY ISSUES:  Prior radiation? no  Pacemaker/ICD? no  Possible current pregnancy? no  Is the patient on methotrexate? no  Current Complaints / other details:  Patient is here with her aunt.  She is taking Femara.  She will have a Zometa infusion tomorrow.  Pateint's calcium was elevated at 12.4 today.  Vitals were taken in Dr. Mariana Kaufman office.

## 2015-08-11 ENCOUNTER — Telehealth: Payer: Self-pay

## 2015-08-11 ENCOUNTER — Ambulatory Visit (HOSPITAL_BASED_OUTPATIENT_CLINIC_OR_DEPARTMENT_OTHER): Payer: Medicare Other

## 2015-08-11 ENCOUNTER — Other Ambulatory Visit: Payer: Self-pay | Admitting: Oncology

## 2015-08-11 VITALS — BP 141/68 | HR 80 | Temp 98.9°F | Resp 17

## 2015-08-11 DIAGNOSIS — C50911 Malignant neoplasm of unspecified site of right female breast: Secondary | ICD-10-CM | POA: Diagnosis not present

## 2015-08-11 DIAGNOSIS — C7951 Secondary malignant neoplasm of bone: Secondary | ICD-10-CM | POA: Diagnosis not present

## 2015-08-11 DIAGNOSIS — C50919 Malignant neoplasm of unspecified site of unspecified female breast: Secondary | ICD-10-CM

## 2015-08-11 DIAGNOSIS — C782 Secondary malignant neoplasm of pleura: Secondary | ICD-10-CM

## 2015-08-11 LAB — CANCER ANTIGEN 27-29 (PARALLEL TESTING): CA 27.29: 232 U/mL — AB (ref 0–39)

## 2015-08-11 LAB — CANCER ANTIGEN 27.29: CA 27.29: 226.1 U/mL — ABNORMAL HIGH (ref 0.0–38.6)

## 2015-08-11 MED ORDER — ZOLEDRONIC ACID 4 MG/100ML IV SOLN
4.0000 mg | Freq: Once | INTRAVENOUS | Status: AC
  Administered 2015-08-11: 4 mg via INTRAVENOUS
  Filled 2015-08-11: qty 100

## 2015-08-11 MED ORDER — SODIUM CHLORIDE 0.9 % IV SOLN: Freq: Once | INTRAVENOUS | Status: AC

## 2015-08-11 MED ORDER — TRAMADOL HCL 50 MG PO TABS
ORAL_TABLET | ORAL | Status: AC
  Filled 2015-08-11: qty 1

## 2015-08-11 MED ORDER — TRAMADOL HCL 50 MG PO TABS
50.0000 mg | ORAL_TABLET | Freq: Once | ORAL | Status: AC
  Administered 2015-08-11: 50 mg via ORAL

## 2015-08-11 MED ORDER — SODIUM CHLORIDE 0.9 % IV SOLN
INTRAVENOUS | Status: DC
  Administered 2015-08-11: 13:00:00 via INTRAVENOUS

## 2015-08-11 MED ORDER — TRAMADOL HCL 50 MG PO TABS: 50.0000 mg | ORAL_TABLET | Freq: Four times a day (QID) | ORAL | Status: DC | PRN

## 2015-08-11 MED ORDER — TRAMADOL HCL 50 MG PO TABS: 50.0000 mg | ORAL_TABLET | Freq: Once | ORAL | Status: DC

## 2015-08-11 NOTE — Telephone Encounter (Signed)
[facility] [phone] and spoke with Raquel, RN for Ms. Keefauver.  She stated that Ms. Schwend needs to bring a prescription to the SNF for the Tramadol. Gave Ms. Fehlman written prescription for tramadol to bring to [facility].  Wrote  prescription information on Physician progress note  For SNF.

## 2015-08-11 NOTE — Patient Instructions (Signed)
Dehydration, Adult Dehydration is a condition in which you do not have enough fluid or water in your body. It happens when you take in less fluid than you lose. Vital organs such as the kidneys, brain, and heart cannot function without a proper amount of fluids. Any loss of fluids from the body can cause dehydration.  Dehydration can range from mild to severe. This condition should be treated right away to help prevent it from becoming severe. CAUSES  This condition may be caused by:  Vomiting.  Diarrhea.  Excessive sweating, such as when exercising in hot or humid weather.  Not drinking enough fluid during strenuous exercise or during an illness.  Excessive urine output.  Fever.  Certain medicines. RISK FACTORS This condition is more likely to develop in:  People who are taking certain medicines that cause the body to lose excess fluid (diuretics).   People who have a chronic illness, such as diabetes, that may increase urination.  Older adults.   People who live at high altitudes.   People who participate in endurance sports.  SYMPTOMS  Mild Dehydration  Thirst.  Dry lips.  Slightly dry mouth.  Dry, warm skin. Moderate Dehydration  Very dry mouth.   Muscle cramps.   Dark urine and decreased urine production.   Decreased tear production.   Headache.   Light-headedness, especially when you stand up from a sitting position.  Severe Dehydration  Changes in skin.   Cold and clammy skin.   Skin does not spring back quickly when lightly pinched and released.   Changes in body fluids.   Extreme thirst.   No tears.   Not able to sweat when body temperature is high, such as in hot weather.   Minimal urine production.   Changes in vital signs.   Rapid, weak pulse (more than 100 beats per minute when you are sitting still).   Rapid breathing.   Low blood pressure.   Other changes.   Sunken eyes.   Cold hands and feet.    Confusion.  Lethargy and difficulty being awakened.  Fainting (syncope).   Short-term weight loss.   Unconsciousness. DIAGNOSIS  This condition may be diagnosed based on your symptoms. You may also have tests to determine how severe your dehydration is. These tests may include:   Urine tests.   Blood tests.  TREATMENT  Treatment for this condition depends on the severity. Mild or moderate dehydration can often be treated at home. Treatment should be started right away. Do not wait until dehydration becomes severe. Severe dehydration needs to be treated at the hospital. Treatment for Mild Dehydration  Drinking plenty of water to replace the fluid you have lost.   Replacing minerals in your blood (electrolytes) that you may have lost.  Treatment for Moderate Dehydration  Consuming oral rehydration solution (ORS). Treatment for Severe Dehydration  Receiving fluid through an IV tube.   Receiving electrolyte solution through a feeding tube that is passed through your nose and into your stomach (nasogastric tube or NG tube).  Correcting any abnormalities in electrolytes. HOME CARE INSTRUCTIONS   Drink enough fluid to keep your urine clear or pale yellow.   Drink water or fluid slowly by taking small sips. You can also try sucking on ice cubes.  Have food or beverages that contain electrolytes. Examples include bananas and sports drinks.  Take over-the-counter and prescription medicines only as told by your health care provider.   Prepare ORS according to the manufacturer's instructions. Take sips   of ORS every 5 minutes until your urine returns to normal.  If you have vomiting or diarrhea, continue to try to drink water, ORS, or both.   If you have diarrhea, avoid:   Beverages that contain caffeine.   Fruit juice.   Milk.   Carbonated soft drinks.  Do not take salt tablets. This can lead to the condition of having too much sodium in your body  (hypernatremia).  SEEK MEDICAL CARE IF:  You cannot eat or drink without vomiting.  You have had moderate diarrhea during a period of more than 24 hours.  You have a fever. SEEK IMMEDIATE MEDICAL CARE IF:   You have extreme thirst.  You have severe diarrhea.  You have not urinated in 6-8 hours, or you have urinated only a small amount of very dark urine.  You have shriveled skin.  You are dizzy, confused, or both.   This information is not intended to replace advice given to you by your health care provider. Make sure you discuss any questions you have with your health care provider.   Document Released: 07/08/2005 Document Revised: 03/29/2015 Document Reviewed: 11/23/2014 Elsevier Interactive Patient Education 2016 Elsevier Inc. Zoledronic Acid injection (Hypercalcemia, Oncology) What is this medicine? ZOLEDRONIC ACID (ZOE le dron ik AS id) lowers the amount of calcium loss from bone. It is used to treat too much calcium in your blood from cancer. It is also used to prevent complications of cancer that has spread to the bone. This medicine may be used for other purposes; ask your health care provider or pharmacist if you have questions. What should I tell my health care provider before I take this medicine? They need to know if you have any of these conditions: -aspirin-sensitive asthma -cancer, especially if you are receiving medicines used to treat cancer -dental disease or wear dentures -infection -kidney disease -receiving corticosteroids like dexamethasone or prednisone -an unusual or allergic reaction to zoledronic acid, other medicines, foods, dyes, or preservatives -pregnant or trying to get pregnant -breast-feeding How should I use this medicine? This medicine is for infusion into a vein. It is given by a health care professional in a hospital or clinic setting. Talk to your pediatrician regarding the use of this medicine in children. Special care may be  needed. Overdosage: If you think you have taken too much of this medicine contact a poison control center or emergency room at once. NOTE: This medicine is only for you. Do not share this medicine with others. What if I miss a dose? It is important not to miss your dose. Call your doctor or health care professional if you are unable to keep an appointment. What may interact with this medicine? -certain antibiotics given by injection -NSAIDs, medicines for pain and inflammation, like ibuprofen or naproxen -some diuretics like bumetanide, furosemide -teriparatide -thalidomide This list may not describe all possible interactions. Give your health care provider a list of all the medicines, herbs, non-prescription drugs, or dietary supplements you use. Also tell them if you smoke, drink alcohol, or use illegal drugs. Some items may interact with your medicine. What should I watch for while using this medicine? Visit your doctor or health care professional for regular checkups. It may be some time before you see the benefit from this medicine. Do not stop taking your medicine unless your doctor tells you to. Your doctor may order blood tests or other tests to see how you are doing. Women should inform their doctor if they wish to  become pregnant or think they might be pregnant. There is a potential for serious side effects to an unborn child. Talk to your health care professional or pharmacist for more information. You should make sure that you get enough calcium and vitamin D while you are taking this medicine. Discuss the foods you eat and the vitamins you take with your health care professional. Some people who take this medicine have severe bone, joint, and/or muscle pain. This medicine may also increase your risk for jaw problems or a broken thigh bone. Tell your doctor right away if you have severe pain in your jaw, bones, joints, or muscles. Tell your doctor if you have any pain that does not go  away or that gets worse. Tell your dentist and dental surgeon that you are taking this medicine. You should not have major dental surgery while on this medicine. See your dentist to have a dental exam and fix any dental problems before starting this medicine. Take good care of your teeth while on this medicine. Make sure you see your dentist for regular follow-up appointments. What side effects may I notice from receiving this medicine? Side effects that you should report to your doctor or health care professional as soon as possible: -allergic reactions like skin rash, itching or hives, swelling of the face, lips, or tongue -anxiety, confusion, or depression -breathing problems -changes in vision -eye pain -feeling faint or lightheaded, falls -jaw pain, especially after dental work -mouth sores -muscle cramps, stiffness, or weakness -redness, blistering, peeling or loosening of the skin, including inside the mouth -trouble passing urine or change in the amount of urine Side effects that usually do not require medical attention (report to your doctor or health care professional if they continue or are bothersome): -bone, joint, or muscle pain -constipation -diarrhea -fever -hair loss -irritation at site where injected -loss of appetite -nausea, vomiting -stomach upset -trouble sleeping -trouble swallowing -weak or tired This list may not describe all possible side effects. Call your doctor for medical advice about side effects. You may report side effects to FDA at 1-800-FDA-1088. Where should I keep my medicine? This drug is given in a hospital or clinic and will not be stored at home. NOTE: This sheet is a summary. It may not cover all possible information. If you have questions about this medicine, talk to your doctor, pharmacist, or health care provider.    2016, Elsevier/Gold Standard. (2013-12-04 14:19:39)

## 2015-08-11 NOTE — Telephone Encounter (Signed)
-----   Message from Gordy Levan, MD sent at 08/11/2015  2:30 PM EST ----- Giving her tramadol 50 mg x1 in infusion now. She will be in infusion until ~ 3:30 per Terri  Needs script for tramadol 50 mg po q 6 hrs prn pain #30 She is at SNF - will need to let them know about the script and see how they need it  Maybe Cyndee or Heather or Melissa would sign if needed  thanks

## 2015-08-11 NOTE — Addendum Note (Signed)
Encounter addended by: Jacqulyn Liner, RN on: 08/11/2015  8:51 AM<BR>     Documentation filed: Charges VN

## 2015-08-12 DIAGNOSIS — E860 Dehydration: Secondary | ICD-10-CM | POA: Insufficient documentation

## 2015-08-12 DIAGNOSIS — A0472 Enterocolitis due to Clostridium difficile, not specified as recurrent: Secondary | ICD-10-CM | POA: Insufficient documentation

## 2015-08-12 DIAGNOSIS — R634 Abnormal weight loss: Secondary | ICD-10-CM | POA: Insufficient documentation

## 2015-08-12 DIAGNOSIS — G893 Neoplasm related pain (acute) (chronic): Secondary | ICD-10-CM | POA: Insufficient documentation

## 2015-08-12 DIAGNOSIS — N189 Chronic kidney disease, unspecified: Secondary | ICD-10-CM

## 2015-08-12 DIAGNOSIS — N179 Acute kidney failure, unspecified: Secondary | ICD-10-CM | POA: Insufficient documentation

## 2015-08-14 ENCOUNTER — Ambulatory Visit
Admission: RE | Admit: 2015-08-14 | Discharge: 2015-08-14 | Disposition: A | Discharge status: Home / Self Care | Payer: Medicare Other | Source: Ambulatory Visit | Attending: Radiation Oncology | Admitting: Radiation Oncology

## 2015-08-14 ENCOUNTER — Encounter: Payer: Self-pay | Admitting: Internal Medicine

## 2015-08-14 DIAGNOSIS — C7951 Secondary malignant neoplasm of bone: Principal | ICD-10-CM

## 2015-08-14 DIAGNOSIS — Z510 Encounter for antineoplastic radiation therapy: Secondary | ICD-10-CM | POA: Diagnosis not present

## 2015-08-14 DIAGNOSIS — C50919 Malignant neoplasm of unspecified site of unspecified female breast: Secondary | ICD-10-CM

## 2015-08-14 NOTE — Assessment & Plan Note (Signed)
SNF - 2/2 diarrhea, repleted;will follow with BMP

## 2015-08-14 NOTE — Assessment & Plan Note (Signed)
SNF - controlled on cozaar 50 mg ; continue

## 2015-08-14 NOTE — Assessment & Plan Note (Signed)
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 Followed up by Dr. Marko Plume.SNF - cont letrozole

## 2015-08-14 NOTE — Assessment & Plan Note (Signed)
likely due to proctitis as shown by CT scan,in the setting of C. Difficile colitis. C diff with positive antigen and negative toxin. Will start treatment given severe diarrhea as main complaint and reason for admission. WBC on admission 19K, improved within normal limits with antibiotics. GI pathogen negative.SNF - cont po vancomycin for 12 more days

## 2015-08-14 NOTE — Assessment & Plan Note (Signed)
-   likely multifactorial, including hypercalcemia, elevated CK, prerenal secondary to dehydration. Improved with fluids.SNF - will follow with BMP

## 2015-08-14 NOTE — Assessment & Plan Note (Signed)
multifactorial due to dehydration, calcium supplements, ?malignancy. Improved with fluids; SNF - follow with BMP

## 2015-08-14 NOTE — Assessment & Plan Note (Signed)
The patient was septic on admission with leukocytosis, elevated lactate and tachypnea, etiology likely GI. Urinalysis negative. Chest x-ray did not show infiltration. CT abdomen showed possible proctitis, which might be the etiology. Blood pressure was soft, which responded to IV fluids. Currently hemodynamically stable. SNF - admit for generalized weakness for OT/PT

## 2015-08-14 NOTE — Assessment & Plan Note (Signed)
SNF - last LDL -57;cont zocor 20 mg daily

## 2015-08-15 ENCOUNTER — Telehealth: Payer: Self-pay

## 2015-08-15 NOTE — Telephone Encounter (Signed)
Reached the night nursing supervisor [place] after 4 atemps to reach the nurses station for Ms. Lynes. Gave orders for stopping calcium tablet and physical therapy precautions as noted below by Dr. Marko Plume. [place] will inform the PT supervisor and the nursing staff of orders.  Dr. Noah Delaine. Sheppard Coil is with [employer] and the attending for Lisa [name] at the SNF. [phone] Fax: [phone]. Faxed Dr. Mariana Kaufman office note from 08-10-15 to Dr. Sheppard Coil as requested below by Dr. Marko Plume.

## 2015-08-15 NOTE — Telephone Encounter (Signed)
-----   Message from Gordy Levan, MD sent at 08/12/2015 10:24 PM EST ----- Please send my note 08-10-15 to the MD or PA covering patient at [name]  Please tell that facility Stop calcium tablet Any PT needs to be very careful, only transfer safety and minimal mobility / activity due to pathologic compression fracture in spine. (I wrote the PT limitations on order sheet for the SNF, but need to make sure they are aware. I can speak to their PT if needed).  Thank you

## 2015-08-15 NOTE — Progress Notes (Signed)
  Radiation Oncology         (336) [phone] ________________________________  Name: Lisa [name] MRN: [id number]  Date: 08/14/2015  DOB: 08/06/1940  SIMULATION AND TREATMENT PLANNING NOTE    ICD-9-CM ICD-10-CM   1. Breast cancer metastasized to bone, unspecified laterality (HCC) 174.9 C50.919    198.5 C79.51     DIAGNOSIS: Metastatic breast cancer with osseous metastasis  NARRATIVE:  The patient was brought to the [facility].  Identity was confirmed.  All relevant records and images related to the planned course of therapy were reviewed.  The patient freely provided informed written consent to proceed with treatment after reviewing the details related to the planned course of therapy. The consent form was witnessed and verified by the simulation staff.  Then, the patient was set-up in a stable reproducible  supine position for radiation therapy.  CT images were obtained.  Surface markings were placed.  The CT images were loaded into the planning software.  Then the target and avoidance structures were contoured.  Treatment planning then occurred.  The radiation prescription was entered and confirmed.  Then, I designed and supervised the construction of a total of 5 medically necessary complex treatment devices.  I have requested : 3D Simulation  I have requested a DVH of the following structures: spinal cord, GTV, kidneys.  I have ordered:dose calc.  PLAN:  The patient will receive 35 Gy in 14 fractions.  ________________________________  -----------------------------------  Blair Promise, PhD, MD

## 2015-08-16 ENCOUNTER — Other Ambulatory Visit: Payer: Self-pay | Admitting: Oncology

## 2015-08-16 DIAGNOSIS — Z510 Encounter for antineoplastic radiation therapy: Secondary | ICD-10-CM | POA: Diagnosis not present

## 2015-08-17 ENCOUNTER — Ambulatory Visit: Payer: 59

## 2015-08-17 ENCOUNTER — Ambulatory Visit (HOSPITAL_BASED_OUTPATIENT_CLINIC_OR_DEPARTMENT_OTHER): Payer: 59 | Admitting: Oncology

## 2015-08-17 ENCOUNTER — Ambulatory Visit
Admission: RE | Admit: 2015-08-17 | Discharge: 2015-08-17 | Disposition: A | Discharge status: Home / Self Care | Payer: Medicare Other | Source: Ambulatory Visit | Attending: Radiation Oncology | Admitting: Radiation Oncology

## 2015-08-17 ENCOUNTER — Telehealth: Payer: Self-pay | Admitting: Oncology

## 2015-08-17 ENCOUNTER — Encounter: Payer: Self-pay | Admitting: Oncology

## 2015-08-17 ENCOUNTER — Other Ambulatory Visit (HOSPITAL_BASED_OUTPATIENT_CLINIC_OR_DEPARTMENT_OTHER): Payer: 59

## 2015-08-17 VITALS — BP 128/75 | HR 96 | Temp 98.8°F | Resp 18 | Ht 63.0 in | Wt 171.5 lb

## 2015-08-17 DIAGNOSIS — C782 Secondary malignant neoplasm of pleura: Principal | ICD-10-CM

## 2015-08-17 DIAGNOSIS — C50919 Malignant neoplasm of unspecified site of unspecified female breast: Secondary | ICD-10-CM

## 2015-08-17 DIAGNOSIS — R634 Abnormal weight loss: Secondary | ICD-10-CM

## 2015-08-17 DIAGNOSIS — C7951 Secondary malignant neoplasm of bone: Principal | ICD-10-CM

## 2015-08-17 DIAGNOSIS — C50911 Malignant neoplasm of unspecified site of right female breast: Secondary | ICD-10-CM

## 2015-08-17 DIAGNOSIS — G893 Neoplasm related pain (acute) (chronic): Secondary | ICD-10-CM

## 2015-08-17 DIAGNOSIS — Z510 Encounter for antineoplastic radiation therapy: Secondary | ICD-10-CM | POA: Diagnosis not present

## 2015-08-17 LAB — CBC WITH DIFFERENTIAL/PLATELET
BASO%: 0.9 % (ref 0.0–2.0)
Basophils Absolute: 0.1 10*3/uL (ref 0.0–0.1)
EOS%: 3.6 % (ref 0.0–7.0)
Eosinophils Absolute: 0.2 10*3/uL (ref 0.0–0.5)
HEMATOCRIT: 36.5 % (ref 34.8–46.6)
HEMOGLOBIN: 12.6 g/dL (ref 11.6–15.9)
LYMPH#: 1.6 10*3/uL (ref 0.9–3.3)
LYMPH%: 27.7 % (ref 14.0–49.7)
MCH: 29.6 pg (ref 25.1–34.0)
MCHC: 34.5 g/dL (ref 31.5–36.0)
MCV: 85.7 fL (ref 79.5–101.0)
MONO#: 0.7 10*3/uL (ref 0.1–0.9)
MONO%: 11.1 % (ref 0.0–14.0)
NEUT#: 3.3 10*3/uL (ref 1.5–6.5)
NEUT%: 56.7 % (ref 38.4–76.8)
PLATELETS: 306 10*3/uL (ref 145–400)
RBC: 4.26 10*6/uL (ref 3.70–5.45)
RDW: 14.2 % (ref 11.2–14.5)
WBC: 5.8 10*3/uL (ref 3.9–10.3)

## 2015-08-17 LAB — COMPREHENSIVE METABOLIC PANEL
ALBUMIN: 3.5 g/dL (ref 3.5–5.0)
ALK PHOS: 148 U/L (ref 40–150)
ALT: 44 U/L (ref 0–55)
ANION GAP: 9 meq/L (ref 3–11)
AST: 34 U/L (ref 5–34)
BILIRUBIN TOTAL: 0.78 mg/dL (ref 0.20–1.20)
BUN: 13.4 mg/dL (ref 7.0–26.0)
CALCIUM: 9 mg/dL (ref 8.4–10.4)
CHLORIDE: 112 meq/L — AB (ref 98–109)
CO2: 20 mEq/L — ABNORMAL LOW (ref 22–29)
CREATININE: 1.1 mg/dL (ref 0.6–1.1)
EGFR: 51 mL/min/{1.73_m2} — ABNORMAL LOW (ref >90)
Glucose: 108 mg/dl (ref 70–140)
Potassium: 3.9 mEq/L (ref 3.5–5.1)
Sodium: 141 mEq/L (ref 136–145)
TOTAL PROTEIN: 7.3 g/dL (ref 6.4–8.3)

## 2015-08-17 NOTE — Progress Notes (Signed)
OFFICE PROGRESS NOTE   August 17, 2015   Physicians:  J.Kinard, [name], R.Donneta Romberg, A.Kendall  INTERVAL HISTORY:  Patient is seen, together with aunt, as we continue to address progressive metastatic breast cancer now involving bone, including pathologic fracture T10 and symptomatic hypercalcemia.  She has begun RT to thoracic spine; she had IV hydration then zometa on 08-11-15. RT is planned thryu 09-05-15. Letrozole was DCd on 08-10-15. PET and MRI T spine are both authorized per [facility] finanacial staff, however are scheduled 09-04-15 per EMR. I have spoken directly with [facility] Scheduling now, with MRI moved to [place] on 08-29-15 as earliest available.   Patient is still at [facility], covering MD Dr Inocencio Homes. We have communicated with that facility earlier this week to restrict any aggressive physical therapy due to new metastatic disease to bone. Patient reports that PT continues to work with with her including careful ambulation. She is using tramadol prn, did take dose prior to coming for RT today and I have encouraged her to continue this; may need to increase pain medication if not adequate. The morphine solution prn is no longer on medication list. She continues oral vancomycin for C diff; diarrhea may be improving.  She reports not enough food at SNF; I have encouraged family to keep food available that she can access herself, discussed supplements. She denies new or different pain. She denies fever, SOB, bleeding, LE swelling, any more falls. Remainder of 10 point Review of Systems negative.  No PAC Declined flu vaccine  ONCOLOGIC HISTORY The right breast carcinoma initially was T2 N1, ER/PR-positive in December 1998, treated with mastectomy with 12 node axillary evaluation and adjuvant Adriamycin/Cytoxan followed by 5 years of tamoxifen through December 2003. 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 CA 2729 was 434 in April 2010. She has tolerated Femara well and clinically has continued to do very well, though CA 27.29 has never normalized. 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  Objective:  Vital signs in last 24 hours:  BP 128/75 mmHg  Pulse 96  Temp(Src) 98.8 F (37.1 C) (Oral)  Resp 18  Ht _0  (1.6 m)  Wt 171 lb 8 oz (77.792 kg)  BMI 30.39 kg/m2  SpO2 97% Looks better and stronger today. Weight down 4 more lbs. Alert, fully oriented and more appropriate/ more clear today.   HEENT:PERRL, sclerae not icteric. Oral mucosa moist without lesions, posterior pharynx clear.  Neck supple. No JVD.  Lymphatics:no cervical,supraclavicular adenopathy Resp: no wheezes or rales bilaterally, BS heard to lower fields bilaterally Cardio: regular rate and rhythm. No gallop. GI: soft, nontender, not distended, no mass or organomegaly. Some bowel sounds.  Musculoskeletal/ Extremities: LE/UE without pitting edema, cords, tenderness Neuro: speech fluent and appropriate, CN intact. Moves all extremities in WC, strength in LE intact on exam in WC. Skin without rash, ecchymosis, petechiae Breasts: right mastectomy  Lab Results:  Results for orders placed or performed in visit on 08/17/15  CBC with Differential  Result Value Ref Range   WBC 5.8 3.9 - 10.3 10e3/uL   NEUT# 3.3 1.5 - 6.5 10e3/uL   HGB 12.6 11.6 - 15.9 g/dL   HCT 36.5 34.8 - 46.6 %   Platelets 306 145 - 400 10e3/uL   MCV 85.7 79.5 - 101.0 fL   MCH  29.6 25.1 - 34.0 pg   MCHC 34.5 31.5 - 36.0 g/dL   RBC 4.26 3.70 - 5.45 10e6/uL   RDW 14.2 11.2 - 14.5 %   lymph# 1.6 0.9 - 3.3 10e3/uL   MONO# 0.7 0.1 - 0.9 10e3/uL   Eosinophils Absolute 0.2 0.0 - 0.5 10e3/uL   Basophils Absolute 0.1 0.0 - 0.1 10e3/uL   NEUT% 56.7 38.4 - 76.8 %   LYMPH% 27.7 14.0 - 49.7 %   MONO% 11.1 0.0 - 14.0 %   EOS% 3.6  0.0 - 7.0 %   BASO% 0.9 0.0 - 2.0 %  Comprehensive metabolic panel  Result Value Ref Range   Sodium 141 136 - 145 mEq/L   Potassium 3.9 3.5 - 5.1 mEq/L   Chloride 112 (H) 98 - 109 mEq/L   CO2 20 (L) 22 - 29 mEq/L   Glucose 108 70 - 140 mg/dl   BUN 13.4 7.0 - 26.0 mg/dL   Creatinine 1.1 0.6 - 1.1 mg/dL   Total Bilirubin 0.78 0.20 - 1.20 mg/dL   Alkaline Phosphatase 148 40 - 150 U/L   AST 34 5 - 34 U/L   ALT 44 0 - 55 U/L   Total Protein 7.3 6.4 - 8.3 g/dL   Albumin 3.5 3.5 - 5.0 g/dL   Calcium 9.0 8.4 - 10.4 mg/dL   Anion Gap 9 3 - 11 mEq/L   EGFR 51 (L) >90 ml/min/1.73 m2    CA 2729 on 1-19 elevated about as it has been, 232 by previous lab method and 226 by new lab method  Studies/Results:  No results found.  Medications: I have reviewed the patient's current medications. Discussed tramadol. She understands Femara has been stopped.  DISCUSSION Situation reviewed. She will continue radiation and will be contacted (or contact SNF and/ or aunt) re earlier appointment at least for MRI T spine (tho this date still may not be as soon as optimal for radiation). Premedicate RT with tramadol.   As CT AP did not show new lung mets in bases and nothing obvious in liver or otherwise AP that suggests metastatic disease into organs, I expect that we will begin Aromasin when I see her next on 08-31-15. Will follow calcium and continue monthly zometa. Did not discuss Aromasin specifically today.    Assessment/Plan:   1. 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 Femara DCd. Zometa given 08-11-15.  MRI T spine and PET as soon as possible. Expect to begin Aromasin next week. 2.Diarrhea: apparently some better than in hospital but not resolved, being treated for C diff based on diarrhea and + antigen,  with toxin negative (and other stool studies negative). Note diarrhea is occurred even with hypercalcemia. 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 Creatinine better today at 1.1, continue to push po fluids. 4.degenerative arthritis knees 5.environmental allergies on desensitization shots by Dr Donneta Romberg 6. Weight loss additional 4 lb for almost 20 lbs since 03-2015 on our scales. 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 [employer]be hypercalcemia at  least intermittently in past, no elevated PTH, thought from oral calcium, not symptomatic then.   All questions answered. Aunt very helpful with all appointments; transportation from SNF almost an hour late for this appointment. Time spent 30 min including >50% counseling and coordination of care. CC Dr Sheppard Coil, Dr Candace Gallus, MD   08/17/2015, 9:44 PM

## 2015-08-17 NOTE — Progress Notes (Signed)
  Radiation Oncology         [phone] ________________________________  Name: Lisa [name] MRN: [id number]  Date: 08/17/2015  DOB: 01/12/1941  Simulation Verification Note    ICD-9-CM ICD-10-CM   1. Breast cancer metastasized to bone, unspecified laterality (HCC) 174.9 C50.919    198.5 C79.51     Status: outpatient  NARRATIVE: The patient was brought to the treatment unit and placed in the planned treatment position. The clinical setup was verified. Then port films were obtained and uploaded to the radiation oncology medical record software.  The treatment beams were carefully compared against the planned radiation fields. The position location and shape of the radiation fields was reviewed. They targeted volume of tissue appears to be appropriately covered by the radiation beams. Organs at risk appear to be excluded as planned.  Based on my personal review, I approved the simulation verification. The patient's treatment will proceed as planned.  -----------------------------------  Blair Promise, PhD, MD

## 2015-08-17 NOTE — Telephone Encounter (Signed)
Patient stopped by and we altered her lab and md vsit appt times to align with rad onc,new calendars have been printed for her

## 2015-08-18 ENCOUNTER — Ambulatory Visit
Admission: RE | Admit: 2015-08-18 | Discharge: 2015-08-18 | Disposition: A | Discharge status: Home / Self Care | Payer: Medicare Other | Source: Ambulatory Visit | Attending: Radiation Oncology | Admitting: Radiation Oncology

## 2015-08-18 ENCOUNTER — Telehealth: Payer: Self-pay | Admitting: Oncology

## 2015-08-18 DIAGNOSIS — C7951 Secondary malignant neoplasm of bone: Principal | ICD-10-CM

## 2015-08-18 DIAGNOSIS — C50911 Malignant neoplasm of unspecified site of right female breast: Secondary | ICD-10-CM

## 2015-08-18 DIAGNOSIS — C50919 Malignant neoplasm of unspecified site of unspecified female breast: Secondary | ICD-10-CM

## 2015-08-18 DIAGNOSIS — Z510 Encounter for antineoplastic radiation therapy: Secondary | ICD-10-CM | POA: Diagnosis not present

## 2015-08-18 MED ORDER — RADIAPLEXRX EX GEL: Freq: Once | CUTANEOUS | Status: DC

## 2015-08-18 NOTE — Telephone Encounter (Signed)
Printed out calendar for pt to receive when she arrives for radiation treatment and put note in EPIC regarding adjustment in 2/2 appt with Dr. Edwyna Shell

## 2015-08-21 ENCOUNTER — Ambulatory Visit
Admission: RE | Admit: 2015-08-21 | Discharge: 2015-08-21 | Disposition: A | Discharge status: Home / Self Care | Payer: Medicare Other | Source: Ambulatory Visit | Attending: Radiation Oncology | Admitting: Radiation Oncology

## 2015-08-21 ENCOUNTER — Ambulatory Visit: Payer: Medicare Other | Admitting: Radiation Oncology

## 2015-08-21 DIAGNOSIS — Z510 Encounter for antineoplastic radiation therapy: Secondary | ICD-10-CM | POA: Diagnosis not present

## 2015-08-21 MED ORDER — RADIAPLEXRX EX GEL
Freq: Once | CUTANEOUS | Status: AC
  Administered 2015-08-21: 09:00:00 via TOPICAL

## 2015-08-22 ENCOUNTER — Ambulatory Visit
Admission: RE | Admit: 2015-08-22 | Discharge: 2015-08-22 | Disposition: A | Discharge status: Home / Self Care | Payer: 59 | Source: Ambulatory Visit | Attending: Radiation Oncology | Admitting: Radiation Oncology

## 2015-08-22 ENCOUNTER — Ambulatory Visit
Admission: RE | Admit: 2015-08-22 | Discharge: 2015-08-22 | Disposition: A | Discharge status: Home / Self Care | Payer: Medicare Other | Source: Ambulatory Visit | Attending: Radiation Oncology | Admitting: Radiation Oncology

## 2015-08-22 ENCOUNTER — Ambulatory Visit: Payer: Medicare Other

## 2015-08-22 ENCOUNTER — Encounter: Payer: Self-pay | Admitting: Radiation Oncology

## 2015-08-22 VITALS — BP 144/63 | HR 98 | Temp 99.1°F | Resp 20 | Wt 175.1 lb

## 2015-08-22 DIAGNOSIS — C7951 Secondary malignant neoplasm of bone: Principal | ICD-10-CM

## 2015-08-22 DIAGNOSIS — Z510 Encounter for antineoplastic radiation therapy: Secondary | ICD-10-CM | POA: Diagnosis not present

## 2015-08-22 DIAGNOSIS — C50919 Malignant neoplasm of unspecified site of unspecified female breast: Secondary | ICD-10-CM

## 2015-08-22 NOTE — Progress Notes (Signed)
  Radiation Oncology         [phone]) [phone] ________________________________  Name: Lisa [name] MRN: [id number]  Date: 08/22/2015  DOB: 1941/03/06  Weekly Radiation Therapy Management    ICD-9-CM ICD-10-CM   1. Breast cancer metastasized to bone, unspecified laterality (HCC) 174.9 C50.919    198.5 C79.51      Current Dose: 10 Gy     Planned Dose:  35 Gy  Narrative . . . . . . . . The patient presents for routine under treatment assessment.                                   The patient is without complaint. No nausea or diarrhea. Pain 2 out of 10.                                 Set-up films were reviewed.                                 The chart was checked. Physical Findings. . .  weight is 175 lb 1.6 oz (79.425 kg). Her oral temperature is 99.1 F (37.3 C). Her blood pressure is 144/63 and her pulse is 98. Her respiration is 20. . The lungs are clear. The heart has a regular rhythm and rate. The abdomen is soft and nontender with normal bowel sounds. Impression . . . . . . . The patient is tolerating radiation. Plan . . . . . . . . . . . . Continue treatment as planned.  ________________________________   Blair Promise, PhD, MD

## 2015-08-22 NOTE — Progress Notes (Signed)
Weekly rad txs  T-spine pain, 2/10 lower back, has lidoderm patch on abdomen, lefr side,   Patient upset  About being late from transportation from her  Rehab nursing home, no nausea, appetite is good, 4:07 PM BP 144/63 mmHg  Pulse 98  Temp(Src) 99.1 F (37.3 C) (Oral)  Resp 20  Wt 175 lb 1.6 oz (79.425 kg)  Wt Readings from Last 3 Encounters:  08/22/15 175 lb 1.6 oz (79.425 kg)  08/17/15 171 lb 8 oz (77.792 kg)  08/10/15 175 lb 8 oz (79.606 kg)

## 2015-08-23 ENCOUNTER — Ambulatory Visit
Admission: RE | Admit: 2015-08-23 | Discharge: 2015-08-23 | Disposition: A | Discharge status: Home / Self Care | Payer: Medicare Other | Source: Ambulatory Visit | Attending: Radiation Oncology | Admitting: Radiation Oncology

## 2015-08-23 ENCOUNTER — Other Ambulatory Visit: Payer: Self-pay | Admitting: Oncology

## 2015-08-23 ENCOUNTER — Ambulatory Visit: Payer: Medicare Other

## 2015-08-23 DIAGNOSIS — Z510 Encounter for antineoplastic radiation therapy: Secondary | ICD-10-CM | POA: Diagnosis not present

## 2015-08-23 DIAGNOSIS — C50911 Malignant neoplasm of unspecified site of right female breast: Secondary | ICD-10-CM

## 2015-08-23 DIAGNOSIS — C7951 Secondary malignant neoplasm of bone: Principal | ICD-10-CM

## 2015-08-24 ENCOUNTER — Ambulatory Visit
Admission: RE | Admit: 2015-08-24 | Discharge: 2015-08-24 | Disposition: A | Discharge status: Home / Self Care | Payer: Medicare Other | Source: Ambulatory Visit | Attending: Radiation Oncology | Admitting: Radiation Oncology

## 2015-08-24 ENCOUNTER — Other Ambulatory Visit: Payer: Medicare Other

## 2015-08-24 ENCOUNTER — Other Ambulatory Visit (HOSPITAL_BASED_OUTPATIENT_CLINIC_OR_DEPARTMENT_OTHER): Payer: 59

## 2015-08-24 ENCOUNTER — Telehealth: Payer: Self-pay | Admitting: Oncology

## 2015-08-24 ENCOUNTER — Encounter: Payer: Self-pay | Admitting: Oncology

## 2015-08-24 ENCOUNTER — Ambulatory Visit (HOSPITAL_BASED_OUTPATIENT_CLINIC_OR_DEPARTMENT_OTHER): Payer: 59 | Admitting: Oncology

## 2015-08-24 VITALS — BP 134/75 | HR 107 | Temp 99.0°F | Resp 18 | Ht 63.0 in | Wt 173.1 lb

## 2015-08-24 DIAGNOSIS — C7951 Secondary malignant neoplasm of bone: Secondary | ICD-10-CM | POA: Diagnosis not present

## 2015-08-24 DIAGNOSIS — C782 Secondary malignant neoplasm of pleura: Secondary | ICD-10-CM

## 2015-08-24 DIAGNOSIS — C50911 Malignant neoplasm of unspecified site of right female breast: Secondary | ICD-10-CM | POA: Diagnosis not present

## 2015-08-24 DIAGNOSIS — R634 Abnormal weight loss: Secondary | ICD-10-CM | POA: Diagnosis not present

## 2015-08-24 DIAGNOSIS — C50919 Malignant neoplasm of unspecified site of unspecified female breast: Secondary | ICD-10-CM

## 2015-08-24 DIAGNOSIS — Z510 Encounter for antineoplastic radiation therapy: Secondary | ICD-10-CM | POA: Diagnosis not present

## 2015-08-24 DIAGNOSIS — M8448XD Pathological fracture, other site, subsequent encounter for fracture with routine healing: Secondary | ICD-10-CM

## 2015-08-24 DIAGNOSIS — G893 Neoplasm related pain (acute) (chronic): Secondary | ICD-10-CM | POA: Diagnosis not present

## 2015-08-24 LAB — COMPREHENSIVE METABOLIC PANEL
ALBUMIN: 3.4 g/dL — AB (ref 3.5–5.0)
ALK PHOS: 137 U/L (ref 40–150)
ALT: 25 U/L (ref 0–55)
AST: 26 U/L (ref 5–34)
Anion Gap: 12 mEq/L — ABNORMAL HIGH (ref 3–11)
BUN: 13.6 mg/dL (ref 7.0–26.0)
CALCIUM: 9.1 mg/dL (ref 8.4–10.4)
CO2: 16 mEq/L — ABNORMAL LOW (ref 22–29)
CREATININE: 1 mg/dL (ref 0.6–1.1)
Chloride: 108 mEq/L (ref 98–109)
EGFR: 54 mL/min/{1.73_m2} — ABNORMAL LOW (ref >90)
Glucose: 172 mg/dl — ABNORMAL HIGH (ref 70–140)
POTASSIUM: 3.9 meq/L (ref 3.5–5.1)
SODIUM: 137 meq/L (ref 136–145)
TOTAL PROTEIN: 7 g/dL (ref 6.4–8.3)
Total Bilirubin: 0.73 mg/dL (ref 0.20–1.20)

## 2015-08-24 NOTE — Telephone Encounter (Signed)
Appointments added and avs printed for patient °

## 2015-08-24 NOTE — Progress Notes (Signed)
OFFICE PROGRESS NOTE   August 26, 2015   Physicians: [name], R.Donneta Romberg, A.Kendall  INTERVAL HISTORY:  Patient is seen, together with aunt, in continuing attention to progressive metastatic breast cancer now involving bone, with pathologic fracture T10 and hypercalcemia. She had zometa on 08-11-15 and continues radiation thru 09-05-15. Femara was DCd 08-10-15.  MRI spine is still pending, now scheduled for 08-29-15. PET is scheduled 09-04-15.  Patient is some better overall, tho still significant back pain, which limits ambulation to distances of just a few yards. She denies LE weakness or numbness. She uses tramadol 50 mg as premedication for radiation (when that is dispensed correctly at SNF) and sometimes at hs. I have suggested that she should take this on scheduled basis prior to RT daily and at hs daily for now, and will also need a scheduled dose early AM 2-13 for PET. Written orders given as above to be hand carried to SNF. Diarrhea is much improved, with formed stools x2 yesterday. She apparently has completed oral vancomycin (med list did not accompany from SNF today). Appetite is better and she has tried supplement drinks, encouraged.  She denies new or different pain, fever, SOB, bleeding, abdominal pain. Denies HA or other neurologic symptoms. No LE swelling. She denies excessive thirst. Remainder of 10 point Review of Systems unchanged.   No PAC Declined flu vaccine  Patient and her mother are hoping to [facility] to [facility].  ONCOLOGIC HISTORY The right breast carcinoma initially was T2 N1, ER/PR-positive in December 1998, treated with mastectomy with 12 node axillary evaluation and adjuvant Adriamycin/Cytoxan followed by 5 years of tamoxifen through December 2003. 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 CA 2729 was 434 in April  2010. She has tolerated Femara well and clinically has continued to do very well, though CA 27.29 has never normalized. 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    Objective:  Vital signs in last 24 hours:  BP 134/75 mmHg  Pulse 107  Temp(Src) 99 F (37.2 C) (Oral)  Resp 18  Ht _0  (1.6 m)  Wt 173 lb 1.6 oz (78.518 kg)  BMI 30.67 kg/m2  SpO2 98% Weight is up 1.5 lbs. Alert, oriented and appropriate. Looks mildly uncomfortable seated in WC with support pillows. Respirations not labored RA. Ambulatory a few steps in exam room with difficulty due to back pain.    HEENT:PERRL, sclerae not icteric. Oral mucosa moist without lesions Lymphatics:no cervical,supraclavicular, axillary adenopathy Resp: clear to auscultation bilaterally  Cardio: regular rate and rhythm. No gallop. GI: soft, nontender, not distended, no mass or organomegaly. Normal bowel sounds.  Musculoskeletal/ Extremities: without pitting edema, cords, tenderness Neuro: speech fluent. Moves extremities equally.  Skin without rash, ecchymosis, petechiae   Lab Results:  Results for orders placed or performed in visit on 08/24/15  CEA  Result Value Ref Range   CEA 25.5 (H) 0.0 - 4.7 ng/mL  CA 15.3  Result Value Ref Range   CA 15-3 245.0 (H) 0.0 - 25.0 U/mL  Comprehensive metabolic panel  Result Value Ref Range   Sodium 137 136 - 145 mEq/L   Potassium 3.9 3.5 - 5.1 mEq/L   Chloride 108 98 - 109 mEq/L   CO2 16 (L) 22 - 29 mEq/L   Glucose 172 (H) 70 - 140  mg/dl   BUN 13.6 7.0 - 26.0 mg/dL   Creatinine 1.0 0.6 - 1.1 mg/dL   Total Bilirubin 0.73 0.20 - 1.20 mg/dL   Alkaline Phosphatase 137 40 - 150 U/L   AST 26 5 - 34 U/L   ALT 25 0 - 55 U/L   Total Protein 7.0 6.4 - 8.3 g/dL   Albumin 3.4 (L) 3.5 - 5.0 g/dL   Calcium 9.1 8.4 - 10.4 mg/dL   Anion Gap 12 (H) 3 - 11 mEq/L   EGFR 54 (L) >90 ml/min/1.73 m2   CMET reviewed with patient at  time of visit  Studies/Results:  No results found.  Medications: I have reviewed the patient's current medications. Needs tramadol 50 mg premed daily for RT and daily at hs; should also have this 0800 on 09-04-15 prior to PET. Obviously could increase dose to 50-100 mg q 6-8 hrs if needed.  DC lidoderm patch, apparently placed on abdomen for symptoms related to diarrhea.  DISCUSSION: Pain meds as noted. Encouraged her to drink supplements at least 2x daily in addition to meals. Likely begin Aromasin after MRI as long as no impending cord compression on that scan, which does not seem likely still by exam, and unless significant other organ involvement by PET (no liver involvement by CT AP 08-02-15).  Discussed fact that C diff does not always clear even when no active infection post treatment, so that clinical condition most important in this evaluation - this is particularly relevant as [place] initially refused admission due to active C diff.   Assessment/Plan:  1. 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 Radiation in process, MRI still pending as first available outpatient, PET pending. Will be due Zometa again 09-15-15. Adjustments in pain medication as above, taking into account situation at SNF. 2.Diarrhea: seems resolved post treatment for C diff (tho Ag +/ toxin -) Note diarrhea occurred even with hypercalcemia. 3 creatinine improved essentially back to baseline now. Encouraged her to continue to push fluids 4.degenerative arthritis less bothersome in knees since weight loss.  5.environmental allergies on desensitization shots by Dr Donneta Romberg 6. Weight loss 15 lbs since 03-2015 on our scales. A little better now, supplements encouraged 7.declines flu vaccine again this year 8..remote past tobacco, quit 1998 10.osteopenia stable by last bone density scan  03-2015 11.patient has been primary caregiver for 88 yo mother at their home, hoping to both move into [facility] soon 72.Advance Directives in place 13.hypokalemia resolved since no diarrhea   All questions answered. Time spent 25 min including >50% counseling and coordination of care. CC Dr Inocencio Homes PCP for SNF and Dr Candace Gallus, MD   08/26/2015, 7:48 PM

## 2015-08-25 ENCOUNTER — Ambulatory Visit
Admission: RE | Admit: 2015-08-25 | Discharge: 2015-08-25 | Disposition: A | Discharge status: Home / Self Care | Payer: Medicare Other | Source: Ambulatory Visit | Attending: Radiation Oncology | Admitting: Radiation Oncology

## 2015-08-25 DIAGNOSIS — Z510 Encounter for antineoplastic radiation therapy: Secondary | ICD-10-CM | POA: Diagnosis not present

## 2015-08-25 LAB — CEA: CEA1: 25.5 ng/mL — AB (ref 0.0–4.7)

## 2015-08-25 LAB — CANCER ANTIGEN 15-3: CA 15-3: 245 U/mL — ABNORMAL HIGH (ref 0.0–25.0)

## 2015-08-26 ENCOUNTER — Other Ambulatory Visit: Payer: Self-pay | Admitting: Oncology

## 2015-08-26 DIAGNOSIS — M8448XA Pathological fracture, other site, initial encounter for fracture: Secondary | ICD-10-CM | POA: Insufficient documentation

## 2015-08-28 ENCOUNTER — Ambulatory Visit
Admission: RE | Admit: 2015-08-28 | Discharge: 2015-08-28 | Disposition: A | Discharge status: Home / Self Care | Payer: Medicare Other | Source: Ambulatory Visit | Attending: Radiation Oncology | Admitting: Radiation Oncology

## 2015-08-28 DIAGNOSIS — Z510 Encounter for antineoplastic radiation therapy: Secondary | ICD-10-CM | POA: Diagnosis not present

## 2015-08-29 ENCOUNTER — Ambulatory Visit (HOSPITAL_COMMUNITY)
Admission: RE | Admit: 2015-08-29 | Discharge: 2015-08-29 | Disposition: A | Discharge status: Home / Self Care | Payer: Medicare Other | Source: Ambulatory Visit | Attending: Oncology | Admitting: Oncology

## 2015-08-29 ENCOUNTER — Ambulatory Visit
Admission: RE | Admit: 2015-08-29 | Discharge: 2015-08-29 | Disposition: A | Discharge status: Home / Self Care | Payer: Medicare Other | Source: Ambulatory Visit | Attending: Radiation Oncology | Admitting: Radiation Oncology

## 2015-08-29 ENCOUNTER — Encounter: Payer: Self-pay | Admitting: Radiation Oncology

## 2015-08-29 VITALS — BP 142/71 | HR 89 | Temp 99.1°F

## 2015-08-29 DIAGNOSIS — C7951 Secondary malignant neoplasm of bone: Principal | ICD-10-CM

## 2015-08-29 DIAGNOSIS — R29890 Loss of height: Secondary | ICD-10-CM

## 2015-08-29 DIAGNOSIS — C50911 Malignant neoplasm of unspecified site of right female breast: Secondary | ICD-10-CM

## 2015-08-29 DIAGNOSIS — M4854XA Collapsed vertebra, not elsewhere classified, thoracic region, initial encounter for fracture: Secondary | ICD-10-CM | POA: Insufficient documentation

## 2015-08-29 DIAGNOSIS — Z510 Encounter for antineoplastic radiation therapy: Secondary | ICD-10-CM | POA: Diagnosis not present

## 2015-08-29 MED ORDER — GADOBENATE DIMEGLUMINE 529 MG/ML IV SOLN
15.0000 mL | Freq: Once | INTRAVENOUS | Status: AC | PRN
  Administered 2015-08-29: 15 mL via INTRAVENOUS

## 2015-08-29 NOTE — Progress Notes (Signed)
  Radiation Oncology         (336) [phone] ________________________________  Name: Lisa [name] MRN: [id number]  Date: 08/29/2015  DOB: 13-Dec-1940  Weekly Radiation Therapy Management    ICD-9-CM ICD-10-CM   1. Breast cancer metastasized to bone, right (HCC) 174.9 C50.911    198.5 C79.51      Current Dose: 22.5 Gy     Planned Dose:  35 Gy  Narrative . . . . . . . . The patient presents for routine under treatment assessment.                                    states good appetite, pt states she had severe pain last night but pain was some what relieved with tramadol, denies any numbness or weakness in her arms or legs, states yesterday and this morning when she was eating she noticed some acid reflux and tightness, states some itching around the base of her neck, some esophageal symptoms but the patient does not wish to have medication at this time for this issue.                                 Set-up films were reviewed.                                 The chart was checked. Physical Findings. . .  temperature is 99.1 F (37.3 C). Her blood pressure is 142/71 and her pulse is 89. Her oxygen saturation is 98%. . The lungs are clear. The heart has a regular rhythm and rate. No significant skin reaction in the treatment area Impression . . . . . . . The patient is tolerating radiation. Plan . . . . . . . . . . . . Continue treatment as planned.  ________________________________   Blair Promise, PhD, MD

## 2015-08-29 NOTE — Progress Notes (Signed)
Lisa [name] arrives for her under treat visit, she has had 9 fractions to her T-spine, pt states she takes naps during the day when she feels fatigue, states good appetite, pt states she had severe pain last night but pain was some what relieved with tramadol, denies any numbness or weakness in her arms or legs, states yesterday and this morning when she was eating she noticed some acid reflux and tightness, states some itching around the base of her neck, no redness to skin noted  BP 142/71 mmHg  Pulse 89  Temp(Src) 99.1 F (37.3 C)  SpO2 98%   Wt Readings from Last 3 Encounters:  08/24/15 173 lb 1.6 oz (78.518 kg)  08/22/15 175 lb 1.6 oz (79.425 kg)  08/17/15 171 lb 8 oz (77.792 kg)

## 2015-08-30 ENCOUNTER — Telehealth: Payer: Self-pay | Admitting: Oncology

## 2015-08-30 ENCOUNTER — Telehealth: Payer: Self-pay

## 2015-08-30 ENCOUNTER — Encounter: Payer: Self-pay | Admitting: Oncology

## 2015-08-30 ENCOUNTER — Other Ambulatory Visit: Payer: Self-pay | Admitting: Oncology

## 2015-08-30 ENCOUNTER — Ambulatory Visit: Admission: RE | Admit: 2015-08-30 | Payer: Medicare Other | Source: Ambulatory Visit

## 2015-08-30 ENCOUNTER — Ambulatory Visit
Admission: RE | Admit: 2015-08-30 | Discharge: 2015-08-30 | Disposition: A | Discharge status: Home / Self Care | Payer: 59 | Source: Ambulatory Visit | Attending: Radiation Oncology | Admitting: Radiation Oncology

## 2015-08-30 ENCOUNTER — Ambulatory Visit
Admission: RE | Admit: 2015-08-30 | Discharge: 2015-08-30 | Disposition: A | Discharge status: Home / Self Care | Payer: Medicare Other | Source: Ambulatory Visit | Attending: Radiation Oncology | Admitting: Radiation Oncology

## 2015-08-30 DIAGNOSIS — C7951 Secondary malignant neoplasm of bone: Principal | ICD-10-CM

## 2015-08-30 DIAGNOSIS — C50911 Malignant neoplasm of unspecified site of right female breast: Secondary | ICD-10-CM

## 2015-08-30 NOTE — Telephone Encounter (Signed)
Faxed report as requested below by Dr. Marko Plume.

## 2015-08-30 NOTE — Telephone Encounter (Signed)
Medical Oncology  No answer at patient's home #, as she had requested office speak with family member if available.  I called [facility] [phone] and spoke with nurse, to let them know that patient may need to be seen for problem identified on MRI. As no phone to allow me to speak directly to patient, nurse to tell her that part of the bone from the compression fracture at T10 is pushed back against spinal cord.  Nurse requests copy of MRI report be faxed for their MD to see. Fax [phone]  L.Marko Plume, MD

## 2015-08-30 NOTE — Progress Notes (Signed)
MEDICAL ONCOLOGY  Communication with Dr Sondra Come, who has also reviewed MRI. Area is in present radiation field. With retropulsed bone he agrees with neurosurgery opinion.  Call placed to Dr Raliegh Ip.Cabbell's office (on call), physician to be back in touch with me  L.Marko Plume, MD

## 2015-08-30 NOTE — Telephone Encounter (Signed)
MEDICAL ONCOLOGY  Phone call from Dr Lacy Duverney office requesting that patient be admitted to [facility] on 08-31-15 for surgery in afternoon 09-01-15. They would like this office to set up admission to hospitalist service.  Hopefully MD can speak with patient when she comes for radiation this afternoon, then address with hospitalist service if she agrees.  Godfrey Pick, MD

## 2015-08-30 NOTE — Progress Notes (Signed)
MEDICAL ONCOLOGY   Spoke with patient by phone now while she was in Radiation Oncology. Discussed findings on MRI and Dr Lacy Duverney recommendation for surgery. I have told her that the MRI shows involvement of the cancer in some of the vertebrae above and below the compression fracture at T10, as well as the retropulsed bone fragment impinging on spinal cord at T10.  Her pain is stable, controlled when she does not put direct pressure on mid back. She is ambulating as previously, no symptoms of cord compression from history. Pain is still significant when she lies on RT table, despite tramadol 50 mg premed.  Discussed brace, however with admission planned on 08-31-15 will   Patient is in agreement with admission to [facility] hospital by hospitalist service on 08-31-15, for surgery by Dr Christella Noa on 09-01-15.  She understands that Dr Christella Noa will meet with her at hospital and go over plans in detail.  Dr Sondra Come is to speak with her also today.  I have also spoken with Hospitalist office for [facility]   [phone]. Will need to call back to that # in AM to speak with admitting MD, then this office to request bed. Patient needs to be seen within 24 hours of admission, so put onto my clinic at 0830 on 08-31-15.  Best contact # for patient is her uncle Donata Duff [phone]  L.Marko Plume, MD

## 2015-08-30 NOTE — Telephone Encounter (Signed)
-----   Message from Gordy Levan, MD sent at 08/30/2015 11:19 AM EST ----- Working on neurosurgery recommendations,  But one way or another will need back brace from [place] For compression fracture T10.  They may be able to meet her at [facility] today if she comes for RT, or may be able to go out to [place] today for this. Doubt she will have to go to ED from neurosurgeon's standpoint, but I am waiting for decision  Could you please see if [employer] might be able to get back brace at some location today?  thanks

## 2015-08-30 NOTE — Progress Notes (Signed)
Medical Oncology  Medical oncologist on call last pm received report of MRI T spine, with some cord compression in area of the compression fracture at T10. He spoke with patient at nursing facility, reported stable and she declined ED evaluation then. Radiation in progress. I have sent this information to radiation oncology now, Dr Sondra Come in clinic but not immediately available to speak by phone.   She has not seen neurosurgery and is not on steroids now.  I will follow up with Dr Sondra Come this AM  L.Marko Plume, MD

## 2015-08-30 NOTE — Telephone Encounter (Signed)
-----   Message from Gordy Levan, MD sent at 08/30/2015 11:01 AM EST ----- Please fax MRI report to Jones Broom MD Fax [phone]

## 2015-08-30 NOTE — Progress Notes (Signed)
  Radiation Oncology         ([phone]) [phone] ________________________________  Name: Lisa [name] MRN: [id number]  Date: 08/30/2015  DOB: [birth year]  Weekly Radiation Therapy Management    ICD-9-CM ICD-10-CM   1. Breast cancer metastasized to bone, right (HCC) 174.9 C50.911    198.5 C79.51      Current Dose: 22.5 Gy     Planned Dose:  35 Gy  Narrative . . . . . . . . The patient presents for routine under treatment assessment.  Yesterday evening the patient underwent a throracic MRI ordered by Dr. Marko Plume to complete the patient's workup of her spine lesion. This showed a pathologic fracture of the T10 with diffuse metastatic disease throughout the compressed vertebral body and posterior elements. Retropulsion of compressed vertebra greater to the right. Compression of the cord. Within the compressed cord there is slight increased signal which may represent edema. Mild kyphosis centered at this level. Bilateral foraminal narrowing. The T9 vertebral body prominent metastatic disease with mild anterior wedging and 25% loss of height anteriorly. The T11 vertebral body metastatic disease most notable anteriorly with 10% loss of height. The L2 vertebral body anterior shows a 1.2 cm metastatic lesion. There is pulmonary parenchymal changes greater on the right incompletely assessed.                                  Set-up films were reviewed.'kk                                 The chart was checked.  Plan   In light of the MRI findings, Dr. Marko Plume spoke with Dr. Christella Noa who recommended surgical intervention for the patient. She will be admitted tomorrow with surgery tomorrow or  Friday. . . . . . . . . . . . . In light of this, the patient's radiation therapy will be on hold and will consider restarting her radiation therapy after recovery from surgery.  ________________________________   Blair Promise, PhD, MD  This document serves as a record of services personally performed by Gery Pray, MD. It was created on his behalf by Darcus Austin, a trained medical scribe. The creation of this record is based on the scribe's personal observations and the provider's statements to them. This document has been checked and approved by the attending provider.

## 2015-08-30 NOTE — Telephone Encounter (Signed)
Medical Oncology  Spoke with Dr Christella Noa, initially to give information, then return call from Dr Christella Noa after he had reviewed scans and discussed. It appears technically possible to do surgical procedure to stabilize spine, tho multiple levels of bone involvement and not sure how much this might benefit.  He is aware that she is outpatient, will coordinate surgery thru his office, does not need patient to be admitted today tho is hoping to do surgery soon (possibly 08-31-15).   Separately, [employer] not able to come to [facility] or to nursing facility to fit back brace; she would need to go to their ofice for this if outpatient.   Spoke with Dr Sondra Come directly re above, as patient scheduled for radiation at 1500 today.  Godfrey Pick, MD

## 2015-08-30 NOTE — Telephone Encounter (Signed)
Spoke with Juliann Pulse at [facility].  They do not have the staff to send people out to facilities to fit the patients. Ms. Steimle will need to go to [facility]. They take walk in patients.  There hours for fitting are 0830-1630. Located at [place]. [address] ([place])

## 2015-08-31 ENCOUNTER — Inpatient Hospital Stay (HOSPITAL_COMMUNITY)
Admission: AD | Admit: 2015-08-31 | Discharge: 2015-09-07 | DRG: 457 | Disposition: A | Discharge status: Skilled Nursing Facility | Payer: Medicare Other | Source: Ambulatory Visit | Attending: Neurosurgery | Admitting: Neurosurgery

## 2015-08-31 ENCOUNTER — Other Ambulatory Visit: Payer: Medicare Other

## 2015-08-31 ENCOUNTER — Other Ambulatory Visit: Payer: 59

## 2015-08-31 ENCOUNTER — Ambulatory Visit (HOSPITAL_BASED_OUTPATIENT_CLINIC_OR_DEPARTMENT_OTHER): Payer: 59 | Admitting: Oncology

## 2015-08-31 ENCOUNTER — Encounter: Payer: Self-pay | Admitting: Oncology

## 2015-08-31 ENCOUNTER — Other Ambulatory Visit (HOSPITAL_BASED_OUTPATIENT_CLINIC_OR_DEPARTMENT_OTHER): Payer: Medicare Other

## 2015-08-31 ENCOUNTER — Ambulatory Visit: Payer: Medicare Other

## 2015-08-31 ENCOUNTER — Ambulatory Visit: Payer: 59 | Admitting: Oncology

## 2015-08-31 ENCOUNTER — Encounter (HOSPITAL_COMMUNITY): Payer: Self-pay | Admitting: General Practice

## 2015-08-31 ENCOUNTER — Other Ambulatory Visit: Payer: Self-pay | Admitting: Neurosurgery

## 2015-08-31 VITALS — BP 140/79 | HR 103 | Temp 98.9°F | Resp 18 | Ht 63.0 in | Wt 171.9 lb

## 2015-08-31 DIAGNOSIS — M170 Bilateral primary osteoarthritis of knee: Secondary | ICD-10-CM | POA: Diagnosis present

## 2015-08-31 DIAGNOSIS — M4854XA Collapsed vertebra, not elsewhere classified, thoracic region, initial encounter for fracture: Secondary | ICD-10-CM | POA: Insufficient documentation

## 2015-08-31 DIAGNOSIS — E785 Hyperlipidemia, unspecified: Secondary | ICD-10-CM | POA: Diagnosis present

## 2015-08-31 DIAGNOSIS — C7951 Secondary malignant neoplasm of bone: Secondary | ICD-10-CM

## 2015-08-31 DIAGNOSIS — C50911 Malignant neoplasm of unspecified site of right female breast: Secondary | ICD-10-CM | POA: Diagnosis not present

## 2015-08-31 DIAGNOSIS — Z419 Encounter for procedure for purposes other than remedying health state, unspecified: Secondary | ICD-10-CM

## 2015-08-31 DIAGNOSIS — G9520 Unspecified cord compression: Secondary | ICD-10-CM | POA: Diagnosis present

## 2015-08-31 DIAGNOSIS — G893 Neoplasm related pain (acute) (chronic): Secondary | ICD-10-CM

## 2015-08-31 DIAGNOSIS — N390 Urinary tract infection, site not specified: Secondary | ICD-10-CM

## 2015-08-31 DIAGNOSIS — I10 Essential (primary) hypertension: Secondary | ICD-10-CM | POA: Diagnosis present

## 2015-08-31 DIAGNOSIS — Z9010 Acquired absence of unspecified breast and nipple: Secondary | ICD-10-CM

## 2015-08-31 DIAGNOSIS — M4854XS Collapsed vertebra, not elsewhere classified, thoracic region, sequela of fracture: Secondary | ICD-10-CM

## 2015-08-31 DIAGNOSIS — N189 Chronic kidney disease, unspecified: Secondary | ICD-10-CM

## 2015-08-31 DIAGNOSIS — E538 Deficiency of other specified B group vitamins: Secondary | ICD-10-CM | POA: Diagnosis present

## 2015-08-31 DIAGNOSIS — Z923 Personal history of irradiation: Secondary | ICD-10-CM

## 2015-08-31 DIAGNOSIS — Z853 Personal history of malignant neoplasm of breast: Secondary | ICD-10-CM

## 2015-08-31 DIAGNOSIS — Z79899 Other long term (current) drug therapy: Secondary | ICD-10-CM

## 2015-08-31 DIAGNOSIS — M8580 Other specified disorders of bone density and structure, unspecified site: Secondary | ICD-10-CM | POA: Diagnosis present

## 2015-08-31 DIAGNOSIS — Z885 Allergy status to narcotic agent status: Secondary | ICD-10-CM

## 2015-08-31 DIAGNOSIS — Z66 Do not resuscitate: Secondary | ICD-10-CM | POA: Diagnosis present

## 2015-08-31 DIAGNOSIS — Z87891 Personal history of nicotine dependence: Secondary | ICD-10-CM

## 2015-08-31 DIAGNOSIS — C50919 Malignant neoplasm of unspecified site of unspecified female breast: Secondary | ICD-10-CM | POA: Diagnosis present

## 2015-08-31 DIAGNOSIS — Z888 Allergy status to other drugs, medicaments and biological substances status: Secondary | ICD-10-CM

## 2015-08-31 DIAGNOSIS — C782 Secondary malignant neoplasm of pleura: Secondary | ICD-10-CM | POA: Diagnosis present

## 2015-08-31 DIAGNOSIS — M899 Disorder of bone, unspecified: Secondary | ICD-10-CM | POA: Diagnosis present

## 2015-08-31 DIAGNOSIS — M949 Disorder of cartilage, unspecified: Secondary | ICD-10-CM

## 2015-08-31 DIAGNOSIS — Z2821 Immunization not carried out because of patient refusal: Secondary | ICD-10-CM

## 2015-08-31 DIAGNOSIS — G8929 Other chronic pain: Secondary | ICD-10-CM | POA: Diagnosis present

## 2015-08-31 DIAGNOSIS — B952 Enterococcus as the cause of diseases classified elsewhere: Secondary | ICD-10-CM

## 2015-08-31 HISTORY — DX: Adverse effect of unspecified anesthetic, initial encounter: T41.45XA

## 2015-08-31 HISTORY — DX: Other specified postprocedural states: R11.2

## 2015-08-31 HISTORY — DX: Other specified postprocedural states: Z98.890

## 2015-08-31 HISTORY — DX: Other complications of anesthesia, initial encounter: T88.59XA

## 2015-08-31 LAB — PROTIME-INR
INR: 1 — ABNORMAL LOW (ref 2.00–3.50)
PROTIME: 12 s (ref 10.6–13.4)

## 2015-08-31 LAB — CBC WITH DIFFERENTIAL/PLATELET
BASO%: 1.1 % (ref 0.0–2.0)
BASOS ABS: 0 10*3/uL (ref 0.0–0.1)
EOS%: 6.3 % (ref 0.0–7.0)
Eosinophils Absolute: 0.2 10*3/uL (ref 0.0–0.5)
HCT: 37.8 % (ref 34.8–46.6)
HGB: 12.7 g/dL (ref 11.6–15.9)
LYMPH#: 0.1 10*3/uL — AB (ref 0.9–3.3)
LYMPH%: 4.5 % — AB (ref 14.0–49.7)
MCH: 29 pg (ref 25.1–34.0)
MCHC: 33.7 g/dL (ref 31.5–36.0)
MCV: 85.9 fL (ref 79.5–101.0)
MONO#: 0.5 10*3/uL (ref 0.1–0.9)
MONO%: 17.2 % — AB (ref 0.0–14.0)
NEUT#: 2.1 10*3/uL (ref 1.5–6.5)
NEUT%: 70.9 % (ref 38.4–76.8)
Platelets: 217 10*3/uL (ref 145–400)
RBC: 4.4 10*6/uL (ref 3.70–5.45)
RDW: 13.7 % (ref 11.2–14.5)
WBC: 3 10*3/uL — ABNORMAL LOW (ref 3.9–10.3)

## 2015-08-31 LAB — COMPREHENSIVE METABOLIC PANEL
ALT: 22 U/L (ref 0–55)
AST: 28 U/L (ref 5–34)
Albumin: 3.5 g/dL (ref 3.5–5.0)
Alkaline Phosphatase: 122 U/L (ref 40–150)
Anion Gap: 11 mEq/L (ref 3–11)
BUN: 14.2 mg/dL (ref 7.0–26.0)
CALCIUM: 9.4 mg/dL (ref 8.4–10.4)
CHLORIDE: 108 meq/L (ref 98–109)
CO2: 18 meq/L — AB (ref 22–29)
CREATININE: 0.9 mg/dL (ref 0.6–1.1)
EGFR: 66 mL/min/{1.73_m2} — ABNORMAL LOW (ref >90)
Glucose: 116 mg/dl (ref 70–140)
POTASSIUM: 3.8 meq/L (ref 3.5–5.1)
SODIUM: 137 meq/L (ref 136–145)
Total Bilirubin: 0.78 mg/dL (ref 0.20–1.20)
Total Protein: 7.1 g/dL (ref 6.4–8.3)

## 2015-08-31 MED ORDER — LOSARTAN POTASSIUM 50 MG PO TABS
50.0000 mg | ORAL_TABLET | Freq: Every day | ORAL | Status: DC
  Administered 2015-08-31 – 2015-09-07 (×7): 50 mg via ORAL
  Filled 2015-08-31 (×8): qty 1

## 2015-08-31 MED ORDER — VITAMIN C 500 MG PO TABS
500.0000 mg | ORAL_TABLET | Freq: Every day | ORAL | Status: DC
  Administered 2015-08-31 – 2015-09-07 (×7): 500 mg via ORAL
  Filled 2015-08-31 (×8): qty 1

## 2015-08-31 MED ORDER — SIMVASTATIN 20 MG PO TABS
20.0000 mg | ORAL_TABLET | Freq: Every day | ORAL | Status: DC
  Administered 2015-08-31 – 2015-09-06 (×6): 20 mg via ORAL
  Filled 2015-08-31 (×6): qty 1

## 2015-08-31 MED ORDER — LORATADINE 10 MG PO TABS
10.0000 mg | ORAL_TABLET | Freq: Every day | ORAL | Status: DC
  Administered 2015-08-31 – 2015-09-07 (×7): 10 mg via ORAL
  Filled 2015-08-31 (×8): qty 1

## 2015-08-31 MED ORDER — VITAMIN B-12 1000 MCG PO TABS
1000.0000 ug | ORAL_TABLET | Freq: Two times a day (BID) | ORAL | Status: DC
  Administered 2015-08-31 – 2015-09-07 (×13): 1000 ug via ORAL
  Filled 2015-08-31 (×17): qty 1

## 2015-08-31 MED ORDER — ALBUTEROL SULFATE (2.5 MG/3ML) 0.083% IN NEBU: 2.5000 mg | INHALATION_SOLUTION | RESPIRATORY_TRACT | Status: DC | PRN

## 2015-08-31 MED ORDER — ADULT MULTIVITAMIN W/MINERALS CH
1.0000 | ORAL_TABLET | Freq: Every day | ORAL | Status: DC
  Administered 2015-08-31 – 2015-09-07 (×7): 1 via ORAL
  Filled 2015-08-31 (×9): qty 1

## 2015-08-31 MED ORDER — VITAMIN E 45 MG (100 UNIT) PO CAPS
200.0000 [IU] | ORAL_CAPSULE | Freq: Every day | ORAL | Status: DC
Start: 2015-08-31 — End: 2015-09-07
  Administered 2015-08-31 – 2015-09-07 (×6): 200 [IU] via ORAL
  Filled 2015-08-31 (×9): qty 2

## 2015-08-31 MED ORDER — DICLOFENAC EPOLAMINE 1.3 % TD PTCH
1.0000 | MEDICATED_PATCH | Freq: Two times a day (BID) | TRANSDERMAL | Status: DC
  Administered 2015-09-03: 1 via TRANSDERMAL
  Filled 2015-08-31 (×9): qty 1

## 2015-08-31 MED ORDER — TRAMADOL HCL 50 MG PO TABS
50.0000 mg | ORAL_TABLET | Freq: Four times a day (QID) | ORAL | Status: DC | PRN
  Administered 2015-09-02 – 2015-09-04 (×6): 100 mg via ORAL
  Administered 2015-09-05 – 2015-09-06 (×6): 50 mg via ORAL
  Administered 2015-09-06: 100 mg via ORAL
  Administered 2015-09-07: 50 mg via ORAL
  Filled 2015-08-31: qty 1
  Filled 2015-08-31 (×2): qty 2
  Filled 2015-08-31: qty 1
  Filled 2015-08-31: qty 2
  Filled 2015-08-31: qty 1
  Filled 2015-08-31: qty 2
  Filled 2015-08-31: qty 1
  Filled 2015-08-31: qty 2
  Filled 2015-08-31: qty 1
  Filled 2015-08-31: qty 2
  Filled 2015-08-31 (×2): qty 1
  Filled 2015-08-31 (×2): qty 2

## 2015-08-31 NOTE — Consult Note (Signed)
Reason for Consult:T9,10,T11 metastatic tumor with pathologic compression fracture T10. Canal compromise at T10 due to retropulsed bone. Referring Physician: [name] is an 75 y.o. female.  HPI: with primary breat CA and known metastatic disease to the spine. [name] has been receiving radiation to the spine for treatment. Unfortunately she was found in January to have a compression fracture at T10, and disease involvement at T9, and T11. The right iliac wing is also involved. MRI performed 08/29/2015 revealed cord compression as the fracture had progressed at T10 causing cord compression. I spoke with Dr. Marko [name] about surgical options and she is admitted for a thoracolumbar fusion and decompression. Lisa [name] has a good deal of pain if something presses at the level of T10, and with standing/walking.   Past Medical History  Diagnosis Date  . Pleural effusion 11-08-2008  . Chronic back pain   . Hypertension   . Complication of anesthesia   . PONV (postoperative nausea and vomiting)   . Breast cancer ([place]) 07-13-1997    right    Past Surgical History  Procedure Laterality Date  . Abdominal hysterectomy  02-21-1982  . Pelvic laparoscopy  11-06-1981  . Dilation and curettage, diagnostic / therapeutic  12-13-1981  . Breast biopsy  07-13-1997  . Mastectomy  07-29-1997  . Thoracentesis  10-18-2008    Family History  Problem Relation Age of Onset  . Heart disease Mother     with pacemaker   . Asthma Father     Social History:  reports that she quit smoking about 19 years ago. Her smoking use included Cigarettes. She has a 15 pack-year smoking history. She has never used smokeless tobacco. She reports that she does not drink alcohol or use illicit drugs.  Allergies:  Allergies  Allergen Reactions  . Diphenhydramine Hcl Other (See Comments)    Severe headaches  . Codeine Sulfate Rash    REACTION: rash. Makes her jittery  . Oseltamivir Phosphate Nausea And Vomiting     REACTION: rash  . Oxycodone-Acetaminophen Nausea And Vomiting    REACTION: emesis  . Prednisone Other (See Comments)    REACTION: hiatal hernia, runny  Nose, felt terrible  . Sudafed [Pseudoephedrine Hcl] Other (See Comments)    vertigo    Medications: I have reviewed the patient's current medications.  Results for orders placed or performed in visit on 08/31/15 (from the past 48 hour(s))  CBC with Differential     Status: Abnormal   Collection Time: 08/31/15  8:25 AM  Result Value Ref Range   WBC 3.0 ([name]) 3.9 - 10.3 10e3/uL   NEUT# 2.1 1.5 - 6.5 10e3/uL   HGB 12.7 11.6 - 15.9 g/dL   HCT 37.8 34.8 - 46.6 %   Platelets 217 145 - 400 10e3/uL   MCV 85.9 79.5 - 101.0 fL   MCH 29.0 25.1 - 34.0 pg   MCHC 33.7 31.5 - 36.0 g/dL   RBC 4.40 3.70 - 5.45 10e6/uL   RDW 13.7 11.2 - 14.5 %   lymph# 0.1 ([name]) 0.9 - 3.3 10e3/uL   MONO# 0.5 0.1 - 0.9 10e3/uL   Eosinophils Absolute 0.2 0.0 - 0.5 10e3/uL   Basophils Absolute 0.0 0.0 - 0.1 10e3/uL   NEUT% 70.9 38.4 - 76.8 %   LYMPH% 4.5 ([name]) 14.0 - 49.7 %   MONO% 17.2 (H) 0.0 - 14.0 %   EOS% 6.3 0.0 - 7.0 %   BASO% 1.1 0.0 - 2.0 %  Protime-INR  Status: Abnormal   Collection Time: 08/31/15  8:25 AM  Result Value Ref Range   Protime 12.0 10.6 - 13.4 Seconds   INR 1.00 ([name]) 2.00 - 3.50    Comment: INR is useful only to assess adequacy of anticoagulation with coumadin when comparing results from different labs. It should not be used to estimate bleeding risk or presence/abscense of coagulopathy in patients not on coumadin. Expected INR ranges for  nontherapeutic patients is 0.88 - 1.12.    Lovenox No   Comprehensive metabolic panel     Status: Abnormal   Collection Time: 08/31/15  8:26 AM  Result Value Ref Range   Sodium 137 136 - 145 mEq/[name]   Potassium 3.8 3.5 - 5.1 mEq/[name]   Chloride 108 98 - 109 mEq/[name]   CO2 18 ([name]) 22 - 29 mEq/[name]   Glucose 116 70 - 140 mg/dl    Comment: Glucose reference range is for nonfasting patients. Fasting glucose  reference range is 70- 100.   BUN 14.2 7.0 - 26.0 mg/dL   Creatinine 0.9 0.6 - 1.1 mg/dL   Total Bilirubin 0.78 0.20 - 1.20 mg/dL   Alkaline Phosphatase 122 40 - 150 U/[name]   AST 28 5 - 34 U/[name]   ALT 22 0 - 55 U/[name]   Total Protein 7.1 6.4 - 8.3 g/dL   Albumin 3.5 3.5 - 5.0 g/dL   Calcium 9.4 8.4 - 10.4 mg/dL   Anion Gap 11 3 - 11 mEq/[name]   EGFR 66 ([name]) >90 ml/min/1.73 m2    Comment: eGFR is calculated using the CKD-EPI Creatinine Equation (2009)    No results found.  Review of Systems  HENT: Negative.   Eyes: Negative.   Respiratory: Negative.   Cardiovascular: Negative.   Gastrointestinal: Negative.   Genitourinary: Negative.   Musculoskeletal: Positive for back pain.  Skin: Negative.   Neurological: Positive for weakness.  Endo/Heme/Allergies: Negative.   Psychiatric/Behavioral: Negative.    Blood pressure 138/64, pulse 93, temperature 98.2 F (36.8 C), temperature source Oral, resp. rate 20, SpO2 97 %. Physical Exam  Constitutional: She is oriented to person, place, and time. She appears well-developed and well-nourished. No distress.  HENT:  Head: Normocephalic and atraumatic.  Right Ear: External ear normal.  Left Ear: External ear normal.  Nose: Nose normal.  Mouth/Throat: Oropharynx is clear and moist.  Eyes: Conjunctivae and EOM are normal. Pupils are equal, round, and reactive to light. Right eye exhibits no discharge. Left eye exhibits no discharge.  Neck: Normal range of motion. Neck supple. No tracheal deviation present. No thyromegaly present.  Cardiovascular: Normal rate, regular rhythm, normal heart sounds and intact distal pulses.   Respiratory: Effort normal and breath sounds normal.  GI: Soft. Bowel sounds are normal.  Musculoskeletal: Normal range of motion. She exhibits tenderness. She exhibits no edema.  Neurological: She is alert and oriented to person, place, and time. She has normal reflexes. She displays normal reflexes. No cranial nerve deficit. She  exhibits normal muscle tone. Coordination normal.  Skin: Skin is warm and dry.  Psychiatric: She has a normal mood and affect. Her behavior is normal. Judgment and thought content normal.    Assessment/Plan: Or for T6-L1 thoracolumbar fusion with pedicle screw fixation and T10 decompression. Risks and benefits including bleeding, infection, paralysis, weakness in the lower extremities, non union, no improvement in pain, hardware failure, and other risks. Lisa [name] understands and wishes to proceed.    Lisa [name] 08/31/2015, 6:32 PM

## 2015-08-31 NOTE — Progress Notes (Signed)
OFFICE PROGRESS NOTE   August 31, 2015   Physicians: [name], R.Donneta Romberg, A.Kendall. New patient referral to K.Cabbell  INTERVAL HISTORY:  Patient is seen, together with aunt, due to findings on MRI T spine accomplished on 08-29-15, now planning surgery by Dr Christella Noa at [facility] on 09-01-15.   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 The MRI on 08-29-15 showed retropulsed bone impinging on spinal cord at that level, and involvement of other levels thru spine. She has had 22.5 of planned 35 Gy radiation to area of T10. Dr Christella Noa reviewed scans on 08-30-15 and has recommended surgical stabilization. She is to be admitted to [facility] today prior to planned surgery on 09-01-15. She does not have obvious symptoms of cord compression. I have spoken with [facility] hospitalist Dr Waldron Labs, who kindly agrees to admit, and with [facility] bed reservations this AM. Dr Sondra Come held radiation as of 08-30-15, tho may resume after surgery. PET for completion of staging was scheduled outpatient for 09-04-15, cancelled now and will reschedule when appropriate after surgery.  Recent course has also been complicated by hypercalcemia, last treated with zometa on 08-11-15. Corrected calcium by labs 08-24-15 was WNL, drawn and pending now.   She has not been on steroids. She has not had back brace (unable to obtain yesterday after Dr Christella Noa and I discussed this). Pain is improved other than when she puts direct pressure on involved area of back, as sitting or lying. She may need increase in tramadol, presently 50 mg ~ 2x daily. She is ambulatory, mostly with walker tho able to walk without this. 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 She has no bowel or bladder incontinence.   She was treated in mid - late Jan for C diff with oral vancomycin, due to diarrhea with C diff antigen + and toxil  negative. She is now having formed stools, reports 4 small formed stools yesterday AM and 1 small formed stool this am by time of 0830 visit.    Review of Systems otherwise:  No HA or other different neurologic complaints. No increased SOB, cough, chest pain including right lower chest where initial pleural involvement. No fever or symptoms of infection. Has been more thirsty this week. Voiding without difficulty. No cardiac symptoms. No nausea or vomiting. Not complaining of environmental allergy symptoms now.  Remainder of 10 point Review of Systems negative / unchanged.    No PAC Declined flu vaccine  ONCOLOGIC HISTORY The right breast carcinoma initially was T2 N1, ER/PR-positive in December 1998, treated with mastectomy with 12 node axillary evaluation and adjuvant Adriamycin/Cytoxan followed by 5 years of tamoxifen through December 2003. 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 CA 2729 was 434 in April 2010. She has tolerated Femara well and clinically has continued to do very well, though CA 27.29 has never normalized. 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 Patient seen by medical oncology 08-10-15 and radiation begun shortly afterwards by Dr Sondra Come, 22.5 of planned 35 Gy radiation to area of T10 thru 08-29-15. MRI T spine obtained after some difficulty on 08-29-15, with pathologic compression fracture T10 with retropulsion of compressed vertebra to right with compression of cord called, involvement also T9, T11,  L2. PET was scheduled for 09-04-15, will delay with surgery.  Objective:  Vital signs in last 24 hours:  BP 140/79 mmHg  Pulse 103  Temp(Src) 98.9 F (37.2 C) (Oral)  Resp 18  Ht 5' 3"  (1.6 m)  Wt 171 lb 14.4 oz (77.973 kg)  BMI 30.46 kg/m2  SpO2 98% Weight down 1 lb Alert,  oriented and appropriate. Cooperative, looks more comfortable than when I saw her shortly after the compression fracture identified.  Able to stand easily from [facility], ambulatory without assistance in exam room, able to turn and move to put on jacket easily.   HEENT:PERRL, sclerae not icteric. Oral mucosa moist without lesions, posterior pharynx clear.  Neck supple. No JVD.  Lymphatics:no cervical,supraclavicular, axillary  adenopathy Resp: clear to auscultation bilaterally and normal percussion bilaterally Cardio: regular rate and rhythm. No gallop. GI: soft, nontender, not distended, no mass or organomegaly. Normal bowel sounds.  Musculoskeletal/ Extremities: UE LE without pitting edema, cords, tenderness. Chronic arthritic changes in knees bilaterally. Back with kyphosis which is more prominent since recent compression fracture. Tender to palpation upper mid back.  Neuro: no change minimal peripheral neuropathy in feet, otherwise sensory, CN, cerebellar intact. Motor strength seems symmetrical in extremities. Speech fluent, MS at baseline. Skin without rash, ecchymosis, petechiae Breasts: right mastectomy scar without evidence of local recurrence. Left breast without dominant mass, skin or nipple findings. Axillae benign. No central catheter  Lab Results:  Results for orders placed or performed in visit on 08/31/15  CBC with Differential  Result Value Ref Range   WBC 3.0 (L) 3.9 - 10.3 10e3/uL   NEUT# 2.1 1.5 - 6.5 10e3/uL   HGB 12.7 11.6 - 15.9 g/dL   HCT 37.8 34.8 - 46.6 %   Platelets 217 145 - 400 10e3/uL   MCV 85.9 79.5 - 101.0 fL   MCH 29.0 25.1 - 34.0 pg   MCHC 33.7 31.5 - 36.0 g/dL   RBC 4.40 3.70 - 5.45 10e6/uL   RDW 13.7 11.2 - 14.5 %   lymph# 0.1 (L) 0.9 - 3.3 10e3/uL   MONO# 0.5 0.1 - 0.9 10e3/uL   Eosinophils Absolute 0.2 0.0 - 0.5 10e3/uL   Basophils Absolute 0.0 0.0 - 0.1 10e3/uL   NEUT% 70.9 38.4 - 76.8 %   LYMPH% 4.5 (L) 14.0 - 49.7 %   MONO% 17.2 (H) 0.0 - 14.0 %    EOS% 6.3 0.0 - 7.0 %   BASO% 1.1 0.0 - 2.0 %  Comprehensive metabolic panel  Result Value Ref Range   Sodium 137 136 - 145 mEq/L   Potassium 3.8 3.5 - 5.1 mEq/L   Chloride 108 98 - 109 mEq/L   CO2 18 (L) 22 - 29 mEq/L   Glucose 116 70 - 140 mg/dl   BUN 14.2 7.0 - 26.0 mg/dL   Creatinine 0.9 0.6 - 1.1 mg/dL   Total Bilirubin 0.78 0.20 - 1.20 mg/dL   Alkaline Phosphatase 122 40 - 150 U/L   AST 28 5 - 34 U/L   ALT 22 0 - 55 U/L   Total Protein 7.1 6.4 - 8.3 g/dL   Albumin 3.5 3.5 - 5.0 g/dL   Calcium 9.4 8.4 - 10.4 mg/dL   Anion Gap 11 3 - 11 mEq/L   EGFR 66 (L) >90 ml/min/1.73 m2  Protime-INR  Result Value Ref Range   Protime 12.0 10.6 - 13.4 Seconds   INR 1.00 (L) 2.00 - 3.50   Lovenox No    INR drawn due  to planned surgery  Studies/Results:  Mr Thoracic Spine W Wo Contrast  08/29/2015  CLINICAL DATA:  75 year old female with metastatic breast cancer. Lower and mid back pain. EXAM: MRI THORACIC SPINE WITHOUT AND WITH CONTRAST TECHNIQUE: Multiplanar and multiecho pulse sequences of the thoracic spine were obtained without and with intravenous contrast. CONTRAST:  67m MULTIHANCE GADOBENATE DIMEGLUMINE 529 MG/ML IV SOLN COMPARISON:  08/02/2015 and 07/26/2015 x-ray. 08/02/2015 CT abdomen and pelvis. CT. 06/29/2014 chest CT. FINDINGS: Pathologic fracture T10 with diffuse metastatic disease throughout the compressed vertebral body and posterior elements. 85% loss of height. Retropulsion of compressed vertebra greater to the right. Compression of the cord. Within the compressed cord there is slight increased signal which may represent edema. Mild kyphosis centered at this level. Bilateral foraminal narrowing. T9 vertebral body prominent metastatic disease with mild anterior wedging and 25% loss of height anteriorly. T11 vertebral body metastatic disease most notable anteriorly with 10% loss of height. L2 vertebral body anterior 1.2 cm metastatic lesion. Pulmonary parenchymal changes greater on  the right incompletely assessed. CT chest can be obtained for further delineation. T5-6 minimal bulge. T6-7 tiny right paracentral protrusion. T7-8 bulge right posterior lateral position. T11-12 mild bulge greater to the right. T12-L1 minimal bulge. IMPRESSION: Pathologic fracture T10 with diffuse metastatic disease throughout the compressed vertebral body and posterior elements. 85% loss of height. Retropulsion of compressed vertebra greater to the right. Compression of the cord. Within the compressed cord there is slight increased signal which may represent edema. Mild kyphosis centered at this level. Bilateral foraminal narrowing. T9 vertebral body prominent metastatic disease with mild anterior wedging and 25% loss of height anteriorly. T11 vertebral body metastatic disease most notable anteriorly with 10% loss of height. L2 vertebral body anterior 1.2 cm metastatic lesion. Pulmonary parenchymal changes greater on the right incompletely assessed. CT chest can be obtained for further delineation. These results will be called to the ordering clinician or representative by the Radiologist Assistant, and communication documented in the PACS or zVision Dashboard. Electronically Signed   By: SGenia DelM.D.   On: 08/29/2015 18:28   PACs images reviewed by MD prior to visit and discussed with Dr CChristella Noaby phone 08-30-15.  Patient and family reviewed MRI images with Dr KSondra Comeon 08-30-15  Medications: I have reviewed the patient's current medications.  DISCUSSION Patient and aunt understand situation from conversations with this MD and with Dr KSondra Comeon 08-30-15. Patient aware [name] She is in agreement with admission today and  plans for surgery to the extent that I have been able to tell her about that now.  I expect that she will be fitted for back brace while at hospital if that is needed. She understands that radiation will be held now, may resume after  surgery, timing to be decided by Dr KSondra Come WIll follow up chemistries pending at time of visit now, address if needed prior to surgery. Will still be most helpful to have PET to complete staging, but do not expect she will be able to manage this outpatient on 2-13 as we have scheduled. I have spoken with [facility] Scheduling to cancel PET for that day and will reschedule when possible. Plan likely to begin new hormonal blocker and to continue IV bisphosphonate for bones and calcium at least monthly.   At patient's request I have LM for Meredith in rad onc to be sure no charge placed for RT on 08-30-15, as this treatment was held  after patient arrived at rad onc.  Assessment/Plan: 1.pathologic fracture T10 with retropulsion of bone impinging on spinal cord: at request of Dr Lacy Duverney office, have arranged admission to [facility] today to hospitalist service, for planned surgery by Dr Christella Noa on 09-01-15.  2. 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 Radiation in process, MRI still pending as first available outpatient, PET still needed and will be rescheduled as above. Will be due Zometa again 09-15-15. Suggest increase tramadol to 100 mg q 8 hr prn 3.Diarrhea:  resolved post treatment for C diff (tho Ag +/ toxin -) Note diarrhea occurred even with hypercalcemia. 4 creatinine improved essentially back to baseline now. Keep hydrated 5.degenerative arthritis less bothersome in knees since weight loss.  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 Appreciate Dr Waldron Labs, Dr Christella Noa and Dr Sondra Come all collaborating in care of this nice lady. Time spent 60 min including >50% counseling and coordination of care. Cc PCP Dr Candace Gallus, MD   08/31/2015, 10:27 AM

## 2015-08-31 NOTE — Progress Notes (Signed)
PT Cancellation Note  Patient Details Name: Lisa [name] MRN: [id number] DOB: Dec 08, 1940   Cancelled Treatment:    Reason Eval/Treat Not Completed: Other (comment) (pt with t10 fx and cord compression awaiting sx next date. will hold eval until cleared post operatively)   Lanetta Inch Beth 08/31/2015, 1:29 PM Elwyn Reach, [place]

## 2015-08-31 NOTE — H&P (Signed)
Patient Demographics  Lisa [name], is a 75 y.o. female  MRN: [id number]   DOB - 08-Mar-1941  Admit Date - 08/31/2015  Outpatient Primary MD for the patient is [name], MD   With History of -  Past Medical History  Diagnosis Date  . Pleural effusion 11-08-2008  . Breast cancer ([place]) 07-13-1997    right  . Chronic back pain   . Hypertension       Past Surgical History  Procedure Laterality Date  . Abdominal hysterectomy  02-21-1982  . Pelvic laparoscopy  11-06-1981  . Dilation and curettage, diagnostic / therapeutic  12-13-1981  . Breast biopsy  07-13-1997  . Mastectomy  07-29-1997  . Thoracentesis  10-18-2008    in for   No chief complaint on file.    HPI  Lisa [name]  is a 75 y.o. 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 Difficile colitis, resolved, of by mouth vancomycin, patient was sent from Dr. Marko Plume office, as MRI o/01/2016 thoracic spine  showing T10 compression fracture,howing retropulsion of bone, impinging on spinal cord, and she discussed with Dr. Cyndy Freeze recommended patient to be admitted,for surgical intervention, recommended admission one day prior to surgery for surgical stabilization,surgery planned on 09/01/2015, even though imaging with evidence of early cord compression, patient with no obvious symptoms of cord compression, patient receiving radiation by Dr. Sondra Come, has been held today, very likely to be resumed after surgery, patient denies any focal deficits, no urinary or stool incontinence, no lower extremity weakness or tingling, complains of chronic lower back pain.   Review of Systems    In addition to the HPI above,  No Fever-chills, No Headache, No changes with Vision or hearing, No problems swallowing food or Liquids, No Chest pain, Cough or Shortness of Breath, No Abdominal pain, No  Nausea or Vommitting, Bowel movements are regular, No Blood in stool or Urine, No dysuria, No new skin rashes or bruises, Complains of chronic lower back pain No new weakness, tingling, numbness in any extremity, No recent weight gain or loss, No polyuria, polydypsia or polyphagia, No significant Mental Stressors.  A full 10 point Review of Systems was done, except as stated above, all other Review of Systems were negative.   Social History Social History  Substance Use Topics  . Smoking status: Former Smoker -- 0.50 packs/day for 30 years    Types: Cigarettes    Quit date: 07/22/1996  . Smokeless tobacco: Never Used  . Alcohol Use: No     Family History Family History  Problem Relation Age of Onset  . Heart disease Mother     with pacemaker   . Asthma Father      Prior to Admission medications   Medication Sig Start Date End Date Taking? Authorizing Provider  ferrous sulfate 325 (65 FE) MG tablet Take 325 mg by mouth 2 (two) times a week. Reported on 08/17/2015   Yes Historical Provider, MD  loratadine (CLARITIN) 10 MG tablet Take 10 mg by mouth daily.    Yes Historical Provider, MD  losartan (COZAAR) 50 MG tablet Take 50 mg by mouth daily.     Yes Historical Provider, MD  Multiple Vitamins-Minerals (MULTIVITAMIN WITH MINERALS) tablet Take 1 tablet by mouth daily.     Yes Historical Provider, MD  simvastatin (ZOCOR) 20 MG tablet Take 20 mg by mouth at bedtime.  Yes Historical Provider, MD  traMADol (ULTRAM) 50 MG tablet Take 1 tablet (50 mg total) by mouth every 6 (six) hours as needed. Patient taking differently: Take 50 mg by mouth every 6 (six) hours as needed for moderate pain.  08/11/15  Yes Laurie Panda, NP  vitamin B-12 (CYANOCOBALAMIN) 1000 MCG tablet Take 1,000 mcg by mouth 2 (two) times daily.   Yes Historical Provider, MD  vitamin C (ASCORBIC ACID) 500 MG tablet Take 500 mg by mouth daily.     Yes Historical Provider, MD  vitamin E 200 UNIT capsule Take  200 Units by mouth daily.     Yes Historical Provider, MD  diclofenac (FLECTOR) 1.3 % PTCH Place 1 patch onto the skin 2 (two) times daily. 08/07/15   [name], MD  EPIPEN 2-PAK 0.3 MG/0.3ML SOAJ injection Inject 0.3 mg into the skin daily as needed (allergic reaction). Reported on 08/10/2015 10/17/14   Historical Provider, MD    Allergies  Allergen Reactions  . Diphenhydramine Hcl Other (See Comments)    Severe headaches  . Oseltamivir Phosphate Nausea And Vomiting    REACTION: rash  . Oxycodone-Acetaminophen Nausea And Vomiting    REACTION: emesis  . Prednisone Other (See Comments)    REACTION: hiatal hernia, runny  Nose, felt terrible  . Sudafed [Pseudoephedrine Hcl]     vertigo  . Codeine Sulfate Rash    REACTION: rash. Makes her jittery    Physical Exam  Vitals  Blood pressure 135/71, pulse 88, temperature 98.2 F (36.8 C), temperature source Oral, resp. rate 20, SpO2 99 %.   1. General well-developed elderly female lying in bed in NAD,    2. Normal affect and insight, Not Suicidal or Homicidal, Awake Alert, Oriented X 3.  3. No F.N deficits, ALL C.Nerves Intact, Strength 5/5 all 4 extremities, Sensation intact all 4 extremities, Plantars down going.  4. Ears and Eyes appear Normal, Conjunctivae clear, PERRLA. Moist Oral Mucosa.  5. Supple Neck, No JVD, No cervical lymphadenopathy appriciated, No Carotid Bruits.  6. Symmetrical Chest wall movement, Good air movement bilaterally, CTAB.  7. RRR, No Gallops, Rubs or Murmurs, No Parasternal Heave.  8. Positive Bowel Sounds, Abdomen Soft, No tenderness, No organomegaly appriciated,No rebound -guarding or rigidity.  9.  No Cyanosis, Normal Skin Turgor, No Skin Rash or Bruise.  10. Good muscle tone,  joints appear normal , no effusions, Normal ROM, lower back tenderness to palpation, no tenderness at thoracic spine area.    Data Review  CBC  Recent Labs Lab 08/31/15 0825  WBC 3.0*  HGB 12.7  HCT 37.8    PLT 217  MCV 85.9  MCH 29.0  MCHC 33.7  RDW 13.7  LYMPHSABS 0.1*  MONOABS 0.5  EOSABS 0.2  BASOSABS 0.0   ------------------------------------------------------------------------------------------------------------------  Chemistries   Recent Labs Lab 08/24/15 1419 08/31/15 0826  NA 137 137  K 3.9 3.8  CO2 16* 18*  GLUCOSE 172* 116  BUN 13.6 14.2  CREATININE 1.0 0.9  CALCIUM 9.1 9.4  AST 26 28  ALT 25 22  ALKPHOS 137 122  BILITOT 0.73 0.78   ------------------------------------------------------------------------------------------------------------------ estimated creatinine clearance is 55.8 mL/min (by C-G formula based on Cr of 0.9). ------------------------------------------------------------------------------------------------------------------ No results for input(s): TSH, T4TOTAL, T3FREE, THYROIDAB in the last 72 hours.  Invalid input(s): FREET3   Coagulation profile  Recent Labs Lab 08/31/15 0825  INR 1.00*  PROTIME 12.0   ------------------------------------------------------------------------------------------------------------------- No results for input(s): DDIMER in the last 72 hours. -------------------------------------------------------------------------------------------------------------------  Cardiac Enzymes No results for input(s): CKMB, TROPONINI, MYOGLOBIN in the last 168 hours.  Invalid input(s): CK ------------------------------------------------------------------------------------------------------------------ Invalid input(s): POCBNP   ---------------------------------------------------------------------------------------------------------------  Urinalysis    Component Value Date/Time   COLORURINE YELLOW 08/02/2015 1909   APPEARANCEUR CLOUDY* 08/02/2015 1909   LABSPEC 1.017 08/02/2015 1909   PHURINE 5.5 08/02/2015 1909   GLUCOSEU NEGATIVE 08/02/2015 1909   HGBUR NEGATIVE 08/02/2015 1909   HGBUR trace-lysed 03/08/2008  1541   BILIRUBINUR NEGATIVE 08/02/2015 1909   KETONESUR NEGATIVE 08/02/2015 1909   PROTEINUR NEGATIVE 08/02/2015 1909   UROBILINOGEN 0.2 11/12/2008 0830   NITRITE NEGATIVE 08/02/2015 1909   LEUKOCYTESUR NEGATIVE 08/02/2015 1909    ----------------------------------------------------------------------------------------------------------------  Imaging results:   Mr Thoracic Spine W Wo Contrast  08/29/2015  CLINICAL DATA:  75 year old female with metastatic breast cancer. Lower and mid back pain. EXAM: MRI THORACIC SPINE WITHOUT AND WITH CONTRAST TECHNIQUE: Multiplanar and multiecho pulse sequences of the thoracic spine were obtained without and with intravenous contrast. CONTRAST:  47mL MULTIHANCE GADOBENATE DIMEGLUMINE 529 MG/ML IV SOLN COMPARISON:  08/02/2015 and 07/26/2015 x-ray. 08/02/2015 CT abdomen and pelvis. CT. 06/29/2014 chest CT. FINDINGS: Pathologic fracture T10 with diffuse metastatic disease throughout the compressed vertebral body and posterior elements. 85% loss of height. Retropulsion of compressed vertebra greater to the right. Compression of the cord. Within the compressed cord there is slight increased signal which may represent edema. Mild kyphosis centered at this level. Bilateral foraminal narrowing. T9 vertebral body prominent metastatic disease with mild anterior wedging and 25% loss of height anteriorly. T11 vertebral body metastatic disease most notable anteriorly with 10% loss of height. L2 vertebral body anterior 1.2 cm metastatic lesion. Pulmonary parenchymal changes greater on the right incompletely assessed. CT chest can be obtained for further delineation. T5-6 minimal bulge. T6-7 tiny right paracentral protrusion. T7-8 bulge right posterior lateral position. T11-12 mild bulge greater to the right. T12-L1 minimal bulge. IMPRESSION: Pathologic fracture T10 with diffuse metastatic disease throughout the compressed vertebral body and posterior elements. 85% loss of height.  Retropulsion of compressed vertebra greater to the right. Compression of the cord. Within the compressed cord there is slight increased signal which may represent edema. Mild kyphosis centered at this level. Bilateral foraminal narrowing. T9 vertebral body prominent metastatic disease with mild anterior wedging and 25% loss of height anteriorly. T11 vertebral body metastatic disease most notable anteriorly with 10% loss of height. L2 vertebral body anterior 1.2 cm metastatic lesion. Pulmonary parenchymal changes greater on the right incompletely assessed. CT chest can be obtained for further delineation. These results will be called to the ordering clinician or representative by the Radiologist Assistant, and communication documented in the PACS or zVision Dashboard. Electronically Signed   By: Genia Del M.D.   On: 08/29/2015 18:28    Assessment & Plan  Active Problems:   HLD (hyperlipidemia)   Essential hypertension   Disorder of bone and cartilage   BREAST CANCER, HX OF   Bone metastases (HCC)   Cord compression [facility])   Pathologic compression fracture of thoracic vertebra [facility])  Pathologic fracture of T10 with evidence of  Early spinal cord compression on imaging - patient with known metastatic breast cancer to pleura and vertebra, receiving radiation therapy, MRI thoracic spine with retropulsion of bone impinging on spinal cord, Dr. Lynett Grimes discussed with Dr. Lupita Dawn, plan for surgery on 09/01/2015. - No indication for IV steroids as discussed with Dr. Cyndy Freeze - no obvious symptoms of cord compression on physical exam. - patient reports his pain is  controlled with tramadol, she would like to avoid opiates.  Metastatic breast cancer to pleura and bone - Followed by Dr. Marko Plume as an outpatient, receiving radiation therapy as an outpatient.  Hypertension - acceptable, continue with home medication  Hyperlipidemia - Continue with statin   DVT Prophylaxis we'll continue with SCD for  today as going for neurosurgery in a.m., and plan for pharmacological DVT prophylaxis postoperatively  AM Labs Ordered, also please review Full Orders  Family Communication: Admission, patients condition and plan of care including tests being ordered have been discussed with the patient and multiple family members who indicate understanding and agree with the plan and Code Status.  Code Status :DO NOT RESUSCITATE(confirmed by patient)  Likely DC to  :pending further workup  Condition GUARDED    Time spent in minutes : 55 minutes    Cecila Satcher M.D on 08/31/2015 at 11:14 AM  Between 7am to 7pm - Pager - [phone]  After 7pm go to [email or web address] - password [email or web address]  And look for the night coverage person covering me after hours  [facility] Office  [phone]

## 2015-08-31 NOTE — Care Management Note (Signed)
Case Management Note  Patient Details  Name: Lisa [name] MRN: [id number] Date of Birth: 1941/01/07  Subjective/Objective:                    Action/Plan: Patient was hospitalized for compression fracture with cord compression. Plan is for neurosurgical intervention on 08/31/15. Will follow for discharge needs post-operatively.  Expected Discharge Date:                  Expected Discharge Plan:     In-House Referral:     Discharge planning Services     Post Acute Care Choice:    Choice offered to:     DME Arranged:    DME Agency:     HH Arranged:    HH Agency:     Status of Service:  In process, will continue to follow  Medicare Important Message Given:    Date Medicare IM Given:    Medicare IM give by:    Date Additional Medicare IM Given:    Additional Medicare Important Message give by:     If discussed at [place] of Stay Meetings, dates discussed:    Additional CommentsRolm Baptise, RN 08/31/2015, 11:42 AM [phone]

## 2015-09-01 ENCOUNTER — Observation Stay (HOSPITAL_COMMUNITY): Payer: Medicare Other | Admitting: Certified Registered Nurse Anesthetist

## 2015-09-01 ENCOUNTER — Other Ambulatory Visit: Payer: Self-pay

## 2015-09-01 ENCOUNTER — Observation Stay (HOSPITAL_COMMUNITY): Payer: Medicare Other

## 2015-09-01 ENCOUNTER — Encounter (HOSPITAL_COMMUNITY)
Admission: AD | Disposition: A | Discharge status: Skilled Nursing Facility | Payer: Self-pay | Source: Ambulatory Visit | Attending: Neurosurgery

## 2015-09-01 ENCOUNTER — Ambulatory Visit: Payer: Medicare Other

## 2015-09-01 DIAGNOSIS — M949 Disorder of cartilage, unspecified: Secondary | ICD-10-CM

## 2015-09-01 DIAGNOSIS — Z2821 Immunization not carried out because of patient refusal: Secondary | ICD-10-CM | POA: Diagnosis not present

## 2015-09-01 DIAGNOSIS — E46 Unspecified protein-calorie malnutrition: Secondary | ICD-10-CM | POA: Diagnosis not present

## 2015-09-01 DIAGNOSIS — Z853 Personal history of malignant neoplasm of breast: Secondary | ICD-10-CM | POA: Diagnosis not present

## 2015-09-01 DIAGNOSIS — G9520 Unspecified cord compression: Secondary | ICD-10-CM | POA: Diagnosis present

## 2015-09-01 DIAGNOSIS — M8580 Other specified disorders of bone density and structure, unspecified site: Secondary | ICD-10-CM | POA: Diagnosis present

## 2015-09-01 DIAGNOSIS — C50919 Malignant neoplasm of unspecified site of unspecified female breast: Secondary | ICD-10-CM | POA: Diagnosis present

## 2015-09-01 DIAGNOSIS — M899 Disorder of bone, unspecified: Secondary | ICD-10-CM | POA: Diagnosis not present

## 2015-09-01 DIAGNOSIS — Z66 Do not resuscitate: Secondary | ICD-10-CM | POA: Diagnosis present

## 2015-09-01 DIAGNOSIS — I10 Essential (primary) hypertension: Secondary | ICD-10-CM

## 2015-09-01 DIAGNOSIS — E785 Hyperlipidemia, unspecified: Secondary | ICD-10-CM | POA: Diagnosis present

## 2015-09-01 DIAGNOSIS — C782 Secondary malignant neoplasm of pleura: Secondary | ICD-10-CM | POA: Diagnosis present

## 2015-09-01 DIAGNOSIS — M8448XD Pathological fracture, other site, subsequent encounter for fracture with routine healing: Secondary | ICD-10-CM | POA: Diagnosis not present

## 2015-09-01 DIAGNOSIS — E538 Deficiency of other specified B group vitamins: Secondary | ICD-10-CM | POA: Diagnosis present

## 2015-09-01 DIAGNOSIS — C50911 Malignant neoplasm of unspecified site of right female breast: Secondary | ICD-10-CM | POA: Diagnosis not present

## 2015-09-01 DIAGNOSIS — Z87891 Personal history of nicotine dependence: Secondary | ICD-10-CM | POA: Diagnosis not present

## 2015-09-01 DIAGNOSIS — C7951 Secondary malignant neoplasm of bone: Secondary | ICD-10-CM | POA: Diagnosis present

## 2015-09-01 DIAGNOSIS — M8448XA Pathological fracture, other site, initial encounter for fracture: Secondary | ICD-10-CM | POA: Diagnosis not present

## 2015-09-01 DIAGNOSIS — Z888 Allergy status to other drugs, medicaments and biological substances status: Secondary | ICD-10-CM | POA: Diagnosis not present

## 2015-09-01 DIAGNOSIS — M170 Bilateral primary osteoarthritis of knee: Secondary | ICD-10-CM | POA: Diagnosis present

## 2015-09-01 DIAGNOSIS — M4854XA Collapsed vertebra, not elsewhere classified, thoracic region, initial encounter for fracture: Secondary | ICD-10-CM | POA: Diagnosis present

## 2015-09-01 DIAGNOSIS — Z885 Allergy status to narcotic agent status: Secondary | ICD-10-CM | POA: Diagnosis not present

## 2015-09-01 DIAGNOSIS — G8929 Other chronic pain: Secondary | ICD-10-CM | POA: Diagnosis present

## 2015-09-01 DIAGNOSIS — Z9010 Acquired absence of unspecified breast and nipple: Secondary | ICD-10-CM | POA: Diagnosis not present

## 2015-09-01 DIAGNOSIS — Z79899 Other long term (current) drug therapy: Secondary | ICD-10-CM | POA: Diagnosis not present

## 2015-09-01 DIAGNOSIS — Z923 Personal history of irradiation: Secondary | ICD-10-CM | POA: Diagnosis not present

## 2015-09-01 HISTORY — PX: POSTERIOR LUMBAR FUSION 4 LEVEL: SHX6037

## 2015-09-01 LAB — SURGICAL PCR SCREEN
MRSA, PCR: NEGATIVE
Staphylococcus aureus: POSITIVE — AB

## 2015-09-01 LAB — COMPREHENSIVE METABOLIC PANEL
ALBUMIN: 3 g/dL — AB (ref 3.5–5.0)
ALT: 21 U/L (ref 14–54)
ANION GAP: 8 (ref 5–15)
AST: 29 U/L (ref 15–41)
Alkaline Phosphatase: 106 U/L (ref 38–126)
BILIRUBIN TOTAL: 0.7 mg/dL (ref 0.3–1.2)
BUN: 11 mg/dL (ref 6–20)
CHLORIDE: 107 mmol/L (ref 101–111)
CO2: 24 mmol/L (ref 22–32)
Calcium: 8.8 mg/dL — ABNORMAL LOW (ref 8.9–10.3)
Creatinine, Ser: 0.88 mg/dL (ref 0.44–1.00)
GFR calc Af Amer: >60 mL/min (ref >60)
GFR calc non Af Amer: >60 mL/min (ref >60)
GLUCOSE: 109 mg/dL — AB (ref 65–99)
POTASSIUM: 3.5 mmol/L (ref 3.5–5.1)
SODIUM: 139 mmol/L (ref 135–145)
TOTAL PROTEIN: 6.5 g/dL (ref 6.5–8.1)

## 2015-09-01 SURGERY — POSTERIOR LUMBAR FUSION 4 LEVEL
Anesthesia: General | Site: Back

## 2015-09-01 MED ORDER — ONDANSETRON HCL 4 MG/2ML IJ SOLN: 4.0000 mg | INTRAMUSCULAR | Status: DC | PRN

## 2015-09-01 MED ORDER — DIAZEPAM 5 MG PO TABS
5.0000 mg | ORAL_TABLET | Freq: Four times a day (QID) | ORAL | Status: DC | PRN
  Administered 2015-09-02: 5 mg via ORAL
  Filled 2015-09-01: qty 1

## 2015-09-01 MED ORDER — GLYCOPYRROLATE 0.2 MG/ML IJ SOLN
INTRAMUSCULAR | Status: DC | PRN
  Administered 2015-09-01: 0.4 mg via INTRAVENOUS

## 2015-09-01 MED ORDER — FENTANYL CITRATE (PF) 250 MCG/5ML IJ SOLN
INTRAMUSCULAR | Status: AC
  Filled 2015-09-01: qty 5

## 2015-09-01 MED ORDER — PHENYLEPHRINE HCL 10 MG/ML IJ SOLN
INTRAMUSCULAR | Status: DC | PRN
  Administered 2015-09-01 (×2): 80 ug via INTRAVENOUS
  Administered 2015-09-01: 40 ug via INTRAVENOUS

## 2015-09-01 MED ORDER — CEFAZOLIN SODIUM-DEXTROSE 2-3 GM-% IV SOLR
INTRAVENOUS | Status: AC
  Administered 2015-09-01: 2 g via INTRAVENOUS
  Filled 2015-09-01: qty 50

## 2015-09-01 MED ORDER — ARTIFICIAL TEARS OP OINT
TOPICAL_OINTMENT | OPHTHALMIC | Status: DC | PRN
Start: 2015-09-01 — End: 2015-09-01
  Administered 2015-09-01: 1 via OPHTHALMIC

## 2015-09-01 MED ORDER — SENNOSIDES-DOCUSATE SODIUM 8.6-50 MG PO TABS: 1.0000 | ORAL_TABLET | Freq: Every evening | ORAL | Status: DC | PRN

## 2015-09-01 MED ORDER — NEOSTIGMINE METHYLSULFATE 10 MG/10ML IV SOLN
INTRAVENOUS | Status: DC | PRN
  Administered 2015-09-01: 3 mg via INTRAVENOUS

## 2015-09-01 MED ORDER — SCOPOLAMINE 1 MG/3DAYS TD PT72
MEDICATED_PATCH | TRANSDERMAL | Status: DC | PRN
  Administered 2015-09-01: 1 via TRANSDERMAL

## 2015-09-01 MED ORDER — LIDOCAINE-EPINEPHRINE 0.5 %-1:200000 IJ SOLN
INTRAMUSCULAR | Status: DC | PRN
  Administered 2015-09-01: 20 mL

## 2015-09-01 MED ORDER — SODIUM CHLORIDE 0.9 % IV SOLN: 250.0000 mL | INTRAVENOUS | Status: DC

## 2015-09-01 MED ORDER — MIDAZOLAM HCL 2 MG/2ML IJ SOLN
INTRAMUSCULAR | Status: AC
  Filled 2015-09-01: qty 2

## 2015-09-01 MED ORDER — SODIUM CHLORIDE 0.9% FLUSH
3.0000 mL | Freq: Two times a day (BID) | INTRAVENOUS | Status: DC
  Administered 2015-09-01 – 2015-09-05 (×7): 3 mL via INTRAVENOUS

## 2015-09-01 MED ORDER — ROCURONIUM BROMIDE 50 MG/5ML IV SOLN
INTRAVENOUS | Status: AC
  Filled 2015-09-01: qty 1

## 2015-09-01 MED ORDER — HYDROMORPHONE HCL 1 MG/ML IJ SOLN
0.2500 mg | INTRAMUSCULAR | Status: DC | PRN
  Administered 2015-09-01: 0.5 mg via INTRAVENOUS

## 2015-09-01 MED ORDER — OXYCODONE-ACETAMINOPHEN 5-325 MG PO TABS: 1.0000 | ORAL_TABLET | ORAL | Status: DC | PRN

## 2015-09-01 MED ORDER — DEXAMETHASONE SODIUM PHOSPHATE 10 MG/ML IJ SOLN
INTRAMUSCULAR | Status: DC | PRN
  Administered 2015-09-01: 10 mg via INTRAVENOUS

## 2015-09-01 MED ORDER — ALBUMIN HUMAN 5 % IV SOLN
INTRAVENOUS | Status: DC | PRN
  Administered 2015-09-01: 20:00:00 via INTRAVENOUS

## 2015-09-01 MED ORDER — LIDOCAINE HCL (CARDIAC) 20 MG/ML IV SOLN
INTRAVENOUS | Status: AC
  Filled 2015-09-01: qty 5

## 2015-09-01 MED ORDER — SODIUM CHLORIDE 0.9% FLUSH: 3.0000 mL | INTRAVENOUS | Status: DC | PRN

## 2015-09-01 MED ORDER — BISACODYL 5 MG PO TBEC: 5.0000 mg | DELAYED_RELEASE_TABLET | Freq: Every day | ORAL | Status: DC | PRN

## 2015-09-01 MED ORDER — PROPOFOL 10 MG/ML IV BOLUS
INTRAVENOUS | Status: AC
  Filled 2015-09-01: qty 20

## 2015-09-01 MED ORDER — ACETAMINOPHEN 650 MG RE SUPP: 650.0000 mg | RECTAL | Status: DC | PRN

## 2015-09-01 MED ORDER — ONDANSETRON HCL 4 MG/2ML IJ SOLN
INTRAMUSCULAR | Status: DC | PRN
  Administered 2015-09-01: 4 mg via INTRAVENOUS

## 2015-09-01 MED ORDER — FENTANYL CITRATE (PF) 100 MCG/2ML IJ SOLN
INTRAMUSCULAR | Status: DC | PRN
  Administered 2015-09-01 (×8): 50 ug via INTRAVENOUS

## 2015-09-01 MED ORDER — SURGIFOAM 100 EX MISC
CUTANEOUS | Status: DC | PRN
  Administered 2015-09-01: 18:00:00 via TOPICAL

## 2015-09-01 MED ORDER — PHENYLEPHRINE HCL 10 MG/ML IJ SOLN
10.0000 mg | INTRAVENOUS | Status: DC | PRN
  Administered 2015-09-01: 20 ug/min via INTRAVENOUS

## 2015-09-01 MED ORDER — MENTHOL 3 MG MT LOZG: 1.0000 | LOZENGE | OROMUCOSAL | Status: DC | PRN

## 2015-09-01 MED ORDER — HYDROMORPHONE HCL 1 MG/ML IJ SOLN
0.5000 mg | INTRAMUSCULAR | Status: DC | PRN
  Administered 2015-09-02 – 2015-09-03 (×3): 1 mg via INTRAVENOUS
  Filled 2015-09-01 (×4): qty 1

## 2015-09-01 MED ORDER — DOCUSATE SODIUM 100 MG PO CAPS
100.0000 mg | ORAL_CAPSULE | Freq: Two times a day (BID) | ORAL | Status: DC
  Administered 2015-09-02 – 2015-09-07 (×11): 100 mg via ORAL
  Filled 2015-09-01 (×11): qty 1

## 2015-09-01 MED ORDER — PHENOL 1.4 % MT LIQD: 1.0000 | OROMUCOSAL | Status: DC | PRN

## 2015-09-01 MED ORDER — ROCURONIUM BROMIDE 100 MG/10ML IV SOLN
INTRAVENOUS | Status: DC | PRN
  Administered 2015-09-01: 10 mg via INTRAVENOUS
  Administered 2015-09-01: 50 mg via INTRAVENOUS

## 2015-09-01 MED ORDER — DEXAMETHASONE SODIUM PHOSPHATE 10 MG/ML IJ SOLN
INTRAMUSCULAR | Status: AC
  Filled 2015-09-01: qty 1

## 2015-09-01 MED ORDER — ONDANSETRON HCL 4 MG/2ML IJ SOLN
INTRAMUSCULAR | Status: AC
  Filled 2015-09-01: qty 2

## 2015-09-01 MED ORDER — HYDROMORPHONE HCL 1 MG/ML IJ SOLN
INTRAMUSCULAR | Status: AC
  Filled 2015-09-01: qty 1

## 2015-09-01 MED ORDER — ACETAMINOPHEN 325 MG PO TABS
650.0000 mg | ORAL_TABLET | ORAL | Status: DC | PRN
  Administered 2015-09-02 – 2015-09-07 (×2): 650 mg via ORAL
  Filled 2015-09-01 (×3): qty 2

## 2015-09-01 MED ORDER — LACTATED RINGERS IV SOLN: INTRAVENOUS | Status: DC

## 2015-09-01 MED ORDER — 0.9 % SODIUM CHLORIDE (POUR BTL) OPTIME
TOPICAL | Status: DC | PRN
  Administered 2015-09-01: 1000 mL

## 2015-09-01 MED ORDER — LACTATED RINGERS IV SOLN
INTRAVENOUS | Status: DC | PRN
  Administered 2015-09-01 (×3): via INTRAVENOUS

## 2015-09-01 MED ORDER — POTASSIUM CHLORIDE IN NACL 20-0.9 MEQ/L-% IV SOLN
INTRAVENOUS | Status: DC
  Administered 2015-09-01 – 2015-09-03 (×2): via INTRAVENOUS
  Filled 2015-09-01 (×13): qty 1000

## 2015-09-01 MED ORDER — BUPIVACAINE HCL (PF) 0.5 % IJ SOLN
INTRAMUSCULAR | Status: DC | PRN
  Administered 2015-09-01: 30 mL

## 2015-09-01 MED ORDER — SCOPOLAMINE 1 MG/3DAYS TD PT72
MEDICATED_PATCH | TRANSDERMAL | Status: AC
  Filled 2015-09-01: qty 1

## 2015-09-01 MED ORDER — MIDAZOLAM HCL 5 MG/5ML IJ SOLN
INTRAMUSCULAR | Status: DC | PRN
  Administered 2015-09-01: 2 mg via INTRAVENOUS

## 2015-09-01 MED ORDER — PROPOFOL 10 MG/ML IV BOLUS
INTRAVENOUS | Status: DC | PRN
  Administered 2015-09-01: 120 mg via INTRAVENOUS

## 2015-09-01 MED ORDER — LIDOCAINE HCL (CARDIAC) 20 MG/ML IV SOLN
INTRAVENOUS | Status: DC | PRN
  Administered 2015-09-01: 60 mg via INTRAVENOUS

## 2015-09-01 MED ORDER — PHENYLEPHRINE HCL 10 MG/ML IJ SOLN
INTRAMUSCULAR | Status: AC
  Filled 2015-09-01: qty 1

## 2015-09-01 MED ORDER — ZOLPIDEM TARTRATE 5 MG PO TABS: 5.0000 mg | ORAL_TABLET | Freq: Every evening | ORAL | Status: DC | PRN

## 2015-09-01 SURGICAL SUPPLY — 72 items
APL SKNCLS STERI-STRIP NONHPOA (GAUZE/BANDAGES/DRESSINGS)
BAG DECANTER FOR FLEXI CONT (MISCELLANEOUS) ×1 IMPLANT
BENZOIN TINCTURE PRP APPL 2/3 (GAUZE/BANDAGES/DRESSINGS) IMPLANT
BLADE CLIPPER SURG (BLADE) IMPLANT
BUR MATCHSTICK NEURO 3.0 LAGG (BURR) ×3 IMPLANT
BUR PRECISION FLUTE 5.0 (BURR) ×3 IMPLANT
CANISTER SUCT 3000ML PPV (MISCELLANEOUS) ×3 IMPLANT
CLOSURE WOUND 1/2 X4 (GAUZE/BANDAGES/DRESSINGS) ×1
CONNECTOR RELINE-O 35-45 5.5 (Connector) ×2 IMPLANT
CONT SPEC 4OZ CLIKSEAL STRL BL (MISCELLANEOUS) ×3 IMPLANT
COVER BACK TABLE 60X90IN (DRAPES) IMPLANT
DECANTER SPIKE VIAL GLASS SM (MISCELLANEOUS) ×3 IMPLANT
DIGITIZER BENDINI (MISCELLANEOUS) ×2 IMPLANT
DRAPE C-ARM 42X72 X-RAY (DRAPES) ×8 IMPLANT
DRAPE C-ARMOR (DRAPES) ×2 IMPLANT
DRAPE LAPAROTOMY 100X72X124 (DRAPES) ×3 IMPLANT
DRAPE POUCH INSTRU U-SHP 10X18 (DRAPES) ×3 IMPLANT
DRAPE SURG 17X23 STRL (DRAPES) ×3 IMPLANT
DRSG OPSITE POSTOP 4X10 (GAUZE/BANDAGES/DRESSINGS) ×2 IMPLANT
DURAPREP 26ML APPLICATOR (WOUND CARE) ×3 IMPLANT
ELECT REM PT RETURN 9FT ADLT (ELECTROSURGICAL) ×3
ELECTRODE REM PT RTRN 9FT ADLT (ELECTROSURGICAL) ×1 IMPLANT
GAUZE SPONGE 4X4 12PLY STRL (GAUZE/BANDAGES/DRESSINGS) IMPLANT
GAUZE SPONGE 4X4 16PLY XRAY LF (GAUZE/BANDAGES/DRESSINGS) IMPLANT
GLOVE BIO SURGEON STRL SZ 6.5 (GLOVE) ×3 IMPLANT
GLOVE BIO SURGEON STRL SZ8 (GLOVE) ×2 IMPLANT
GLOVE BIO SURGEONS STRL SZ 6.5 (GLOVE) ×3
GLOVE BIOGEL PI IND STRL 6.5 (GLOVE) IMPLANT
GLOVE BIOGEL PI INDICATOR 6.5 (GLOVE) ×4
GLOVE ECLIPSE 6.5 STRL STRAW (GLOVE) ×6 IMPLANT
GLOVE EXAM NITRILE LRG STRL (GLOVE) IMPLANT
GLOVE EXAM NITRILE MD LF STRL (GLOVE) IMPLANT
GLOVE EXAM NITRILE XL STR (GLOVE) IMPLANT
GLOVE EXAM NITRILE XS STR PU (GLOVE) IMPLANT
GOWN STRL REUS W/ TWL LRG LVL3 (GOWN DISPOSABLE) ×2 IMPLANT
GOWN STRL REUS W/ TWL XL LVL3 (GOWN DISPOSABLE) IMPLANT
GOWN STRL REUS W/TWL 2XL LVL3 (GOWN DISPOSABLE) IMPLANT
GOWN STRL REUS W/TWL LRG LVL3 (GOWN DISPOSABLE) ×9
GOWN STRL REUS W/TWL XL LVL3 (GOWN DISPOSABLE) ×3
GRAFT BN 10X1XDBM MAGNIFUSE (Bone Implant) IMPLANT
GRAFT BONE MAGNIFUSE 1X10CM (Bone Implant) ×3 IMPLANT
KIT BASIN OR (CUSTOM PROCEDURE TRAY) ×3 IMPLANT
KIT POSITION SURG JACKSON T1 (MISCELLANEOUS) ×3 IMPLANT
KIT ROOM TURNOVER OR (KITS) ×3 IMPLANT
LIQUID BAND (GAUZE/BANDAGES/DRESSINGS) ×3 IMPLANT
NDL HYPO 25X1 1.5 SAFETY (NEEDLE) ×1 IMPLANT
NDL SPNL 18GX3.5 QUINCKE PK (NEEDLE) IMPLANT
NEEDLE HYPO 25X1 1.5 SAFETY (NEEDLE) ×3 IMPLANT
NEEDLE SPNL 18GX3.5 QUINCKE PK (NEEDLE) ×3 IMPLANT
NS IRRIG 1000ML POUR BTL (IV SOLUTION) ×3 IMPLANT
PACK LAMINECTOMY NEURO (CUSTOM PROCEDURE TRAY) ×3 IMPLANT
PAD ARMBOARD 7.5X6 YLW CONV (MISCELLANEOUS) ×9 IMPLANT
PUTTY BONE ATTRAX 5CC STRIP (Putty) ×4 IMPLANT
ROD RELINE-O 5.5X300 STRT NS (Rod) IMPLANT
ROD RELINE-O 5.5X300MM STRT (Rod) ×6 IMPLANT
SCREW LOCK RELINE 5.5 TULIP (Screw) ×20 IMPLANT
SCREW RELINE POLY 4.5X40MM (Screw) ×6 IMPLANT
SCREW RELINE-O POLY 4.0X40MM (Screw) ×8 IMPLANT
SCREW RELINE-O POLY 6.5X40 (Screw) ×8 IMPLANT
SCREW RLINE PLY 2S 40X4.5XPA (Screw) IMPLANT
SPONGE LAP 4X18 X RAY DECT (DISPOSABLE) IMPLANT
SPONGE SURGIFOAM ABS GEL 100 (HEMOSTASIS) ×3 IMPLANT
STRIP CLOSURE SKIN 1/2X4 (GAUZE/BANDAGES/DRESSINGS) ×1 IMPLANT
SUT PROLENE 6 0 BV (SUTURE) IMPLANT
SUT VIC AB 0 CT1 18XCR BRD8 (SUTURE) ×1 IMPLANT
SUT VIC AB 0 CT1 8-18 (SUTURE) ×6
SUT VIC AB 2-0 CT1 18 (SUTURE) ×5 IMPLANT
SUT VIC AB 3-0 SH 8-18 (SUTURE) ×5 IMPLANT
TOWEL OR 17X24 6PK STRL BLUE (TOWEL DISPOSABLE) ×3 IMPLANT
TOWEL OR 17X26 10 PK STRL BLUE (TOWEL DISPOSABLE) ×3 IMPLANT
TRAY FOLEY CATH 14FRSI W/METER (CATHETERS) ×2 IMPLANT
WATER STERILE IRR 1000ML POUR (IV SOLUTION) ×3 IMPLANT

## 2015-09-01 NOTE — Anesthesia Preprocedure Evaluation (Addendum)
Anesthesia Evaluation  Patient identified by MRN, date of birth, ID band Patient awake    Reviewed: Allergy & Precautions, H&P , NPO status , Patient's Chart, lab work & pertinent test results  History of Anesthesia Complications (+) PONV and history of anesthetic complications  Airway Mallampati: II  TM Distance: >3 FB Neck ROM: Full    Dental no notable dental hx. (+) Teeth Intact, Dental Advisory Given   Pulmonary neg pulmonary ROS, former smoker,  Pleural effusion   Pulmonary exam normal breath sounds clear to auscultation       Cardiovascular hypertension, Pt. on medications Normal cardiovascular exam Rhythm:regular Rate:Normal     Neuro/Psych negative neurological ROS  negative psych ROS   GI/Hepatic negative GI ROS, Neg liver ROS,   Endo/Other  negative endocrine ROS  Renal/GU negative Renal ROS  negative genitourinary   Musculoskeletal   Abdominal   Peds  Hematology negative hematology ROS (+)   Anesthesia Other Findings Breast cancer with bone mets and pathological fractures of vertebrae  Reproductive/Obstetrics negative OB ROS                           Anesthesia Physical Anesthesia Plan  ASA: III  Anesthesia Plan: General   Post-op Pain Management:    Induction: Intravenous  Airway Management Planned: Oral ETT  Additional Equipment:   Intra-op Plan:   Post-operative Plan: Extubation in OR  Informed Consent: I have reviewed the patients History and Physical, chart, labs and discussed the procedure including the risks, benefits and alternatives for the proposed anesthesia with the patient or authorized representative who has indicated his/her understanding and acceptance.   Dental advisory given  Plan Discussed with: CRNA, Anesthesiologist and Surgeon  Anesthesia Plan Comments:         Anesthesia Quick Evaluation

## 2015-09-01 NOTE — Progress Notes (Signed)
Patient Demographics:    Lisa [name], is a 75 y.o. female, DOB - [birth year], [id number]  Admit date - 08/31/2015   Admitting Physician Albertine Patricia, MD  Outpatient Primary MD for the patient is Lisa Lopes, MD  LOS - 1   No chief complaint on file.       Subjective:    [name] today has, No headache, No chest pain, No abdominal pain - No Nausea, No new weakness tingling or numbness, No Cough - SOB.  Mild constant nonradiating mid-low back pain.   Assessment  & Plan :     1. Metastatic breast cancer with metastases to T10 spine causing compression fracture and pain. She is under the care of Dr. Marko Plume - was sent to the hospital from her office. She has been evaluated by neurosurgery and is due for surgical correction on 09/01/2015. She will be a moderate risk candidate for adverse cardio pulmonary outcome during the perioperative period, this was explained to the patient by me personally in detail and she accepts the risks and would like to proceed with the procedure. Would request operating surgeon to kindly address postop activity orders, weight-bearing status and DVT prophylaxis.   2. Dyslipidemia. Continue home dose statin.  3. Essential hypertension on ARB continue.  4. Vitamin B-12 deficiency. Continue supplementation per home dose.  5. Metastatic breast cancer. Had right breast surgical removal several years ago, has finished chemotherapy, currently undergoing chemotreatments per patient, Commence treatment with the primary oncologist postdischarge.    ( ONCOLOGIC HISTORY - The right breast carcinoma initially was T2 N1, ER/PR-positive in December 1998, treated with mastectomy with 12 node axillary evaluation and adjuvant Adriamycin/Cytoxan followed by 5 years of  tamoxifen through December 2003. 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 CA 2729 was 434 in April 2010. She has tolerated Femara well and clinically has continued to do very well, though CA 27.29 has never normalized. Metastatic disease to T spine with pathologic fracture T10, and to right iliac wing identified on imaging 07-2015.)    Code Status : DNR  Family Communication  : none present  Disposition Plan  : TBD  Consults  :  N Surg  Procedures  :    DVT Prophylaxis  :   SCDs    Lab Results  Component Value Date   PLT 217 08/31/2015    Inpatient Medications  Scheduled Meds: . diclofenac  1 patch Transdermal BID  . loratadine  10 mg Oral Daily  . losartan  50 mg Oral Daily  . multivitamin with minerals  1 tablet Oral Daily  . simvastatin  20 mg Oral QHS  . vitamin B-12  1,000 mcg Oral BID  . vitamin C  500 mg Oral Daily  . vitamin E  200 Units Oral Daily   Continuous Infusions:  PRN Meds:.albuterol, traMADol  Antibiotics  :     Anti-infectives    None        Objective:   Filed Vitals:   08/31/15 2056 09/01/15 0108 09/01/15 0550 09/01/15 1036  BP: 140/71 153/96 125/73 142/72  Pulse: 94 93 92 88  Temp:  99.1 F (37.3 C) 100 F (37.8 C) 99.3 F (37.4 C) 99 F (37.2 C)  TempSrc: Oral Oral Oral Oral  Resp: 20 20 20 18   SpO2: 98% 97% 96% 96%    Wt Readings from Last 3 Encounters:  08/31/15 77.973 kg (171 lb 14.4 oz)  08/24/15 78.518 kg (173 lb 1.6 oz)  08/22/15 79.425 kg (175 lb 1.6 oz)     Intake/Output Summary (Last 24 hours) at 09/01/15 1102 Last data filed at 08/31/15 2319  Gross per 24 hour  Intake    340 ml  Output      0 ml  Net    340 ml     Physical Exam  Awake Alert, Oriented X 3, No new F.N deficits, Normal affect [place].AT,PERRAL Supple Neck,No JVD, No cervical lymphadenopathy appriciated.  Symmetrical Chest  wall movement, Good air movement bilaterally, CTAB RRR,No Gallops,Rubs or new Murmurs, No Parasternal Heave +ve B.Sounds, Abd Soft, No tenderness, No organomegaly appriciated, No rebound - guarding or rigidity. No Cyanosis, Clubbing or edema, No new Rash or bruise       Data Review:   Micro Results No results found for this or any previous visit (from the past 240 hour(s)).     Mr Thoracic Spine W Wo Contrast  08/29/2015  CLINICAL DATA:  75 year old female with metastatic breast cancer. Lower and mid back pain. EXAM: MRI THORACIC SPINE WITHOUT AND WITH CONTRAST TECHNIQUE: Multiplanar and multiecho pulse sequences of the thoracic spine were obtained without and with intravenous contrast. CONTRAST:  62mL MULTIHANCE GADOBENATE DIMEGLUMINE 529 MG/ML IV SOLN COMPARISON:  08/02/2015 and 07/26/2015 x-ray. 08/02/2015 CT abdomen and pelvis. CT. 06/29/2014 chest CT. FINDINGS: Pathologic fracture T10 with diffuse metastatic disease throughout the compressed vertebral body and posterior elements. 85% loss of height. Retropulsion of compressed vertebra greater to the right. Compression of the cord. Within the compressed cord there is slight increased signal which may represent edema. Mild kyphosis centered at this level. Bilateral foraminal narrowing. T9 vertebral body prominent metastatic disease with mild anterior wedging and 25% loss of height anteriorly. T11 vertebral body metastatic disease most notable anteriorly with 10% loss of height. L2 vertebral body anterior 1.2 cm metastatic lesion. Pulmonary parenchymal changes greater on the right incompletely assessed. CT chest can be obtained for further delineation. T5-6 minimal bulge. T6-7 tiny right paracentral protrusion. T7-8 bulge right posterior lateral position. T11-12 mild bulge greater to the right. T12-L1 minimal bulge. IMPRESSION: Pathologic fracture T10 with diffuse metastatic disease throughout the compressed vertebral body and posterior elements. 85%  loss of height. Retropulsion of compressed vertebra greater to the right. Compression of the cord. Within the compressed cord there is slight increased signal which may represent edema. Mild kyphosis centered at this level. Bilateral foraminal narrowing. T9 vertebral body prominent metastatic disease with mild anterior wedging and 25% loss of height anteriorly. T11 vertebral body metastatic disease most notable anteriorly with 10% loss of height. L2 vertebral body anterior 1.2 cm metastatic lesion. Pulmonary parenchymal changes greater on the right incompletely assessed. CT chest can be obtained for further delineation. These results will be called to the ordering clinician or representative by the Radiologist Assistant, and communication documented in the PACS or zVision Dashboard. Electronically Signed   By: Genia Del M.D.   On: 08/29/2015 18:28     CBC  Recent Labs Lab 08/31/15 0825  WBC 3.0*  HGB 12.7  HCT 37.8  PLT 217  MCV 85.9  MCH 29.0  MCHC 33.7  RDW 13.7  LYMPHSABS 0.1*  MONOABS 0.5  EOSABS 0.2  BASOSABS 0.0    Chemistries   Recent Labs Lab 08/31/15 0826 09/01/15 0746  NA 137 139  K 3.8 3.5  CL  --  107  CO2 18* 24  GLUCOSE 116 109*  BUN 14.2 11  CREATININE 0.9 0.88  CALCIUM 9.4 8.8*  AST 28 29  ALT 22 21  ALKPHOS 122 106  BILITOT 0.78 0.7   ------------------------------------------------------------------------------------------------------------------ No results for input(s): CHOL, HDL, LDLCALC, TRIG, CHOLHDL, LDLDIRECT in the last 72 hours.  No results found for: HGBA1C ------------------------------------------------------------------------------------------------------------------ No results for input(s): TSH, T4TOTAL, T3FREE, THYROIDAB in the last 72 hours.  Invalid input(s): FREET3 ------------------------------------------------------------------------------------------------------------------ No results for input(s): VITAMINB12, FOLATE,  FERRITIN, TIBC, IRON, RETICCTPCT in the last 72 hours.  Coagulation profile  Recent Labs Lab 08/31/15 0825  INR 1.00*  PROTIME 12.0    No results for input(s): DDIMER in the last 72 hours.  Cardiac Enzymes No results for input(s): CKMB, TROPONINI, MYOGLOBIN in the last 168 hours.  Invalid input(s): CK ------------------------------------------------------------------------------------------------------------------ No results found for: BNP  Time Spent in minutes   35   SINGH,PRASHANT K M.D on 09/01/2015 at 11:02 AM  Between 7am to 7pm - Pager - [phone]  After 7pm go to [email or web address] - password [facility]  [facility]  Office  [phone]

## 2015-09-01 NOTE — Anesthesia Procedure Notes (Signed)
Procedure Name: Intubation Date/Time: 09/01/2015 4:50 PM Performed by: Garrison Columbus T Pre-anesthesia Checklist: Patient identified, Emergency Drugs available, Suction available and Patient being monitored Patient Re-evaluated:Patient Re-evaluated prior to inductionOxygen Delivery Method: Circle system utilized Preoxygenation: Pre-oxygenation with 100% oxygen Intubation Type: IV induction Ventilation: Mask ventilation without difficulty Laryngoscope Size: Miller and 2 Grade View: Grade I Tube type: Oral Tube size: 7.5 mm Number of attempts: 1 Airway Equipment and Method: Stylet Placement Confirmation: ETT inserted through vocal cords under direct vision,  positive ETCO2 and breath sounds checked- equal and bilateral Secured at: 21 cm Tube secured with: Tape Dental Injury: Teeth and Oropharynx as per pre-operative assessment

## 2015-09-01 NOTE — Op Note (Signed)
08/31/2015 - 09/01/2015  9:06 PM  PATIENT:  Lisa [name]  75 y.o. female with metastatic breast cancer. She presents with a pathologic compression fracture of T10 and spinal instability. Mrs. Frizzell has agreed to a thoracolumbar fusion for stabilization and a decompressive laminectomy.  PRE-OPERATIVE DIAGNOSIS:  Thoracic ten retropulsed pathological fracture  POST-OPERATIVE DIAGNOSIS:  Thoracic ten retropulsed pathological fracture  PROCEDURE:  Procedure(s): Thoracic six-Lumbar one posterolateral arthrodesis with allograft morsels  pedicle screw instrumentation T6-L1(Nuv[employer]) T10 laminectomy, partial laminectomy T9, T11  SURGEON:  Surgeon(s): Ashok Pall, MD Eustace Moore, MD  ASSISTANTS:Jones, Shanon Brow  ANESTHESIA:   general  EBL:  Total I/O In: 1250 [I.V.:1000; IV Piggyback:250] Out: 575 [Urine:175; Blood:400]  BLOOD ADMINISTERED:none  CELL SAVER GIVEN:none  COUNT:per nursing  DRAINS: (1) Hemovact drain(s) in the subfascial plane with  Suction Open   SPECIMEN:  No Specimen  DICTATION: [name] is a 75 y.o. female whom was taken to the operating room intubated, and placed under a general anesthetic without difficulty. A foley catheter was placed under sterile conditions. She  was positioned prone on a Jackson table with all pressure points properly padded.  Her thoraco- lumbar region was prepped and draped in a sterile manner. I infiltrated 20cc's 1/2%lidocaine/1:2000,000 strength epinephrine into the planned incision. I opened the skin with a 10 blade and took the incision down to the thoracolumbar fascia. I exposed the lamina oft6,7,8,9,10,11,12, and  L1 in a subperiosteal fashion bilaterally. I confirmed my location with an intraoperative xray.  I placed self retaining retractors and started the decompression.  I decompressed the spinal canal at T10 via a complete laminectomy of T10, and partial laminectomies of T9, and 11. I used the drill, bone rongeurs, and  Kerrison punches to remove the bone and decompress the spinal cord and canal.  I decorticated the lamina  at T6,7,8,9,11,12 and  L1. I then placed allograft on the decorticated surfaces to complete the posterolateral arthrodesis.  We placed pedicle screws at T6,7,8,12, and L1 using fluoroscopic guidance. We drilled a pilot hole, then cannulated the pedicle with a bone probe at each site. We then placed self tapping screws at each level. We used 4.60mm screws at T6, and T8, 5.62mm screws at T7, 6.78mm screws at T12 , and L1. We assessed each site for pedicle violations. No cutouts were appreciated. All screws used were 56mm in length, and were supplied by [employer]uv[employer]. Using the Bendini device and software rods were fashioned for the right and left sides of the construct. They were placed without difficulty. A crosslink was also place over the laminectomy site at T10. The rods were locked into position with Nuvasive Torque screws. Final films were performed and all screws appeared to be in good position. I placed a subfascial hemovac drain bringing the end out through the skin. I closed the wound in a layered fashion. I approximated the thoracolumbar fascia, subcutaneous, and subcuticular planes with vicryl sutures. I closed the skin with staples.  I used an occlusive bandage for a sterile dressing.     PLAN OF CARE: Admit to inpatient   PATIENT DISPOSITION:  PACU - hemodynamically stable.   Delay start of Pharmacological VTE agent (>24hrs) due to surgical blood loss or risk of bleeding:  yes

## 2015-09-01 NOTE — Transfer of Care (Signed)
Immediate Anesthesia Transfer of Care Note  Patient: Lisa [name]  Procedure(s) Performed: Procedure(s) with comments: Thoracic six-Lumbar one fusion with arthrodesis pedicle screw stabilization and decompression (N/A) - T6-L1 fusion with arthrodesis pedicle screw stabilization and decompression  Patient Location: PACU  Anesthesia Type:General  Level of Consciousness: awake  Airway & Oxygen Therapy: Patient Spontanous Breathing and Patient connected to face mask oxygen  Post-op Assessment: Report given to RN and Post -op Vital signs reviewed and stable  Post vital signs: Reviewed and stable  Last Vitals:  Filed Vitals:   09/01/15 1424 09/01/15 2059  BP: 144/73 162/91  Pulse: 91 107  Temp: 36.9 C   Resp: 16 23    Complications: No apparent anesthesia complications

## 2015-09-02 DIAGNOSIS — C7951 Secondary malignant neoplasm of bone: Secondary | ICD-10-CM

## 2015-09-02 DIAGNOSIS — C50919 Malignant neoplasm of unspecified site of unspecified female breast: Secondary | ICD-10-CM

## 2015-09-02 DIAGNOSIS — E46 Unspecified protein-calorie malnutrition: Secondary | ICD-10-CM

## 2015-09-02 DIAGNOSIS — M8448XA Pathological fracture, other site, initial encounter for fracture: Secondary | ICD-10-CM

## 2015-09-02 LAB — BASIC METABOLIC PANEL
ANION GAP: 12 (ref 5–15)
BUN: 10 mg/dL (ref 6–20)
CHLORIDE: 106 mmol/L (ref 101–111)
CO2: 19 mmol/L — ABNORMAL LOW (ref 22–32)
Calcium: 7.6 mg/dL — ABNORMAL LOW (ref 8.9–10.3)
Creatinine, Ser: 0.67 mg/dL (ref 0.44–1.00)
Glucose, Bld: 149 mg/dL — ABNORMAL HIGH (ref 65–99)
POTASSIUM: 4.5 mmol/L (ref 3.5–5.1)
SODIUM: 137 mmol/L (ref 135–145)

## 2015-09-02 LAB — CBC
HCT: 33.6 % — ABNORMAL LOW (ref 36.0–46.0)
Hemoglobin: 11.3 g/dL — ABNORMAL LOW (ref 12.0–15.0)
MCH: 28.7 pg (ref 26.0–34.0)
MCHC: 33.6 g/dL (ref 30.0–36.0)
MCV: 85.3 fL (ref 78.0–100.0)
Platelets: 187 10*3/uL (ref 150–400)
RBC: 3.94 MIL/uL (ref 3.87–5.11)
RDW: 13.5 % (ref 11.5–15.5)
WBC: 7.2 10*3/uL (ref 4.0–10.5)

## 2015-09-02 LAB — MAGNESIUM: MAGNESIUM: 1.6 mg/dL — AB (ref 1.7–2.4)

## 2015-09-02 NOTE — Progress Notes (Signed)
MEDICAL ONCOLOGY September 02, 2015, 9:04 AM  Hospital day 3 Post op day 1 Thoracic six-Lumbar one posterolateral arthrodesis with allograft morsels pedicle screw instrumentation T6-L1(Nuvasive) T10 laminectomy, partial laminectomy T9, T11 Antibiotics: none Chemotherapy: none.  IV bisphosphonate last 08-11-15   Outpatient Physicians: [name], L.Livesay, ,J.Kinard, R.Sharma, A.Kendall  EMR reviewed, patient seen in ICU, no family here presently  Subjective: Awake, alert, talkative, lying supine in bed on RA. Only pain is bilaterally across low pelvis where extremely sensitive even to light touch, and "skin" of left forearm. Moves LE easily. No upper back pain now. Denies SOB. Thirsty, is drinking water, has not eaten however breakfast tray with regular diet just arrived.    No PAC Declined flu vaccine Treated midJan with full course oral vancomycin for C diff (antigen +, toxin -, with diarrhea)  ONCOLOGIC HISTORY The right breast carcinoma initially was T2 N1, ER/PR-positive in December 1998, treated with mastectomy with 12 node axillary evaluation and adjuvant Adriamycin/Cytoxan followed by 5 years of tamoxifen through December 2003. 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 CA 2729 was 434 in April 2010. She has tolerated Femara well and clinically has continued to do very well, though CA 27.29 has never normalized. 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 Patient seen by medical oncology 08-10-15 and radiation begun shortly afterwards by Dr Sondra Come, 22.5 of planned 35 Gy radiation to area of T10 thru 08-29-15. MRI T spine obtained after some difficulty on 08-29-15, with pathologic compression fracture T10 with retropulsion of compressed vertebra to right with compression of cord called,  involvement also T9, T11, L2. PET was scheduled for 09-04-15, will delay with surgery.    Objective: Vital signs in last 24 hours: Blood pressure 115/63, pulse 82, temperature 97.7 F (36.5 C), temperature source Oral, resp. rate 13, height 5\' 3"  (1.6 m), weight 174 lb 6.1 oz (79.1 kg), SpO2 95 %. Fully alert and oriented. PERRL, not icteric. Oral mucosa dry without lesions. Clear breath sounds anteriorly. Heart RRR no gallop. Abdomen soft, quiet, not distended. Sensitive to light touch, including moving sheets, across low pelvis at level of iliac crests with no rash, no bruising. Foley in with amber urine. Feet warm without swelling. Moves feet and LE easily. Peripheral IV LUE site ok  Intake/Output from previous day: 02/10 0701 - 02/11 0700 In: 2995.3 [P.O.:240; I.V.:2505.3; IV Piggyback:250] Out: [id number] [Urine:1800; Drains:255; Blood:500] Intake/Output this shift: Total I/O In: 80 [I.V.:80] Out: -     Lab Results:  Recent Labs  08/31/15 0825  WBC 3.0*  HGB 12.7  HCT 37.8  PLT 217   BMET  Recent Labs  09/01/15 0746 09/02/15 0524  NA 139 137  K 3.5 4.5  CL 107 106  CO2 24 19*  GLUCOSE 109* 149*  BUN 11 10  CREATININE 0.88 0.67  CALCIUM 8.8* 7.6*  CMET 09-01-15 with alb 3.0  Studies/Results: Dg Thoracolumabar Spine  09/01/2015  CLINICAL DATA:  75 year old with metastatic breast cancer to bone and pathologic T10 compression fracture with retropulsion and cord compression. T6 through L1 fusion and posterior decompression surgery. EXAM: OPERATIVE THORACOLUMBAR SPINE - 2 VIEW COMPARISON:  MRI thoracic spine 08/29/2015. FINDINGS: Two spot images from the C-arm fluoroscopic device, lateral images of the thoracolumbar spine obtained during the fusion procedure are submitted for interpretation postoperatively. Initial  image demonstrates pedicle screws in 2 adjacent vertebrae at the thoracolumbar junction which are likely T12 and L1. 2nd image demonstrates pedicle screws in 3  adjacent mid thoracic vertebrae. IMPRESSION: Pedicle screw placement in the mid thoracic spine and at the thoracolumbar junction during T6 through L1 fusion and posterior decompression. Electronically Signed   By: Evangeline Dakin M.D.   On: 09/01/2015 20:24   Dg C-arm Gt 7 Min  09/01/2015  CLINICAL DATA:  75 year old with metastatic breast cancer to bone and pathologic T10 compression fracture with retropulsion and cord compression. T6 through L1 fusion and posterior decompression surgery. EXAM: OPERATIVE THORACOLUMBAR SPINE - 2 VIEW COMPARISON:  MRI thoracic spine 08/29/2015. FINDINGS: Two spot images from the C-arm fluoroscopic device, lateral images of the thoracolumbar spine obtained during the fusion procedure are submitted for interpretation postoperatively. Initial image demonstrates pedicle screws in 2 adjacent vertebrae at the thoracolumbar junction which are likely T12 and L1. 2nd image demonstrates pedicle screws in 3 adjacent mid thoracic vertebrae. IMPRESSION: Pedicle screw placement in the mid thoracic spine and at the thoracolumbar junction during T6 through L1 fusion and posterior decompression. Electronically Signed   By: Evangeline Dakin M.D.   On: 09/01/2015 20:24     Assessment/Plan: 1.progressive metastatic breast cancer now involving bone, with pathologic compression fracture T10 and retropulsion of bone impinging on spinal cord:  Post T6 - L1 posterolateral arthrodesis with T10 laminectomy and T9/ T11 partial laminectomy by Dr Christella Noa on 09-01-15. Continuing care in neurosurgery ICU 2.post partial course radiation thru 08-29-15 to T10 region. Dr Sondra Come may complete radiation when appropriate following this surgery 3.hypercalcemia and metastatic bone involvement: post zometa 1- 20-17 which will be continued monthly for now. Corrected calcium ok today.  4.Hormone positive right breast cancer diagnosed 1998 and recurrent to pleura in 2010, post VATS pleurodesis and long stable course  on Femara until recent progression. Femara DCd 08-10-15; expect to start Aromasin in near future. Have cancelled PET originally planned for 09-04-15, will need to be rescheduled outpatient when possible as she has not had full evaluation for restaging. If pain continues right forearm would Xray there. Has involvement seen on CT in right iliac wing which has not been symptomatic. No other complaints of pain in long bones, but again has not gotten PET (or bone scan) accomplished.  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 Patient and mother hoping to move together to [facility]. Other family is 10 yo uncle and his wife, and a cousin in [place]. Advance DIrectives done. 9.degenerative arthritis knees and osteopenia. 10.environmental allergies  Thank you. Please page me if I can assist between my rounds   Pager [phone]  LIVESAY,LENNIS P

## 2015-09-02 NOTE — Progress Notes (Signed)
Postop day 1. Overall patient doing very well. Pain well controlled. She does note some discomfort into her lower abdomen bilaterally. She is having no radiating pain into her lower extremities. She has no symptoms of numbness or weakness.  She is afebrile. Heart rate and vital signs are acceptable. Urine output is good. Drain output is moderate. Motor 5/5 bilateral lower extremities.  Doing well following thoracic decompression and fusion for metastatic tumor. Begin mobilization today. Okay to transfer to floor.

## 2015-09-02 NOTE — Plan of Care (Signed)
Patient seen postop, doing well fairly asymptomatic except for some paresthesias in her abdomen and back around T11 dermatome, no weakness, case discussed with Dr. Trenton Gammon neurosurgeon following. Neurosurgery has seen the care of this patient, they will contact medical service for any medical needs. Case will be transferred to neurosurgery. Appreciate their help.

## 2015-09-02 NOTE — Progress Notes (Signed)
PT Cancellation Note  Patient Details Name: Lisa [name] MRN: [id number] DOB: 1940/12/05   Cancelled Treatment:    Reason Eval/Treat Not Completed: Other (comment).  Pt awaiting brace to be delivered.  Will f/u another time.   Khoi Hamberger, Thornton Papas 09/02/2015, 11:33 AM

## 2015-09-03 LAB — GLUCOSE, CAPILLARY: GLUCOSE-CAPILLARY: 145 mg/dL — AB (ref 65–99)

## 2015-09-03 MED ORDER — SENNA 8.6 MG PO TABS
1.0000 | ORAL_TABLET | Freq: Two times a day (BID) | ORAL | Status: DC | PRN
  Administered 2015-09-05: 8.6 mg via ORAL
  Administered 2015-09-05: 17.2 mg via ORAL
  Filled 2015-09-03: qty 2
  Filled 2015-09-03: qty 1

## 2015-09-03 MED ORDER — ENSURE ENLIVE PO LIQD
237.0000 mL | Freq: Two times a day (BID) | ORAL | Status: DC
  Filled 2015-09-03 (×5): qty 237

## 2015-09-03 NOTE — Progress Notes (Signed)
Foley cath D/C. Pt due to void. Unable to void on BSC. Ambulated a few step with brace on to chair. Unable to fully walk. Very weak BLE.

## 2015-09-03 NOTE — Evaluation (Signed)
Physical Therapy Evaluation Patient Details Name: Lisa [name] MRN: [id number] DOB: 1941/01/30 Today's Date: 09/03/2015   History of Present Illness  Adm 2/9 with pathologic T10 fracture (due to metastatic breast Ca) with cord compression; 2/10 T6-L1 fusion  Clinical Impression  Patient is s/p above surgery resulting in the deficits listed below (see PT Problem List). Today pt was very impulsive and with poor coordination of bil legs while ambulating. Patient will benefit from skilled PT to increase their independence and safety with mobility (while adhering to their precautions) to allow discharge to the venue listed below.     Follow Up Recommendations SNF;Supervision/Assistance - 24 hour    Equipment Recommendations  None recommended by PT    Recommendations for Other Services OT consult     Precautions / Restrictions Precautions Precautions: Back;Fall Precaution Comments: educated on precautions; needed frequent cues for bending Required Braces or Orthoses: Spinal Brace Spinal Brace: Lumbar corset;Applied in sitting position (with thoracic extension)      Mobility  Bed Mobility Overal bed mobility: Needs Assistance Bed Mobility: Rolling;Sidelying to Sit Rolling: Min assist Sidelying to sit: Min assist;HOB elevated       General bed mobility comments: with rail; max cues for sequencing to adhere to back precautions; pt impulsive due to need to use bathroom  Transfers Overall transfer level: Needs assistance Equipment used: Rolling walker (2 wheeled) Transfers: Sit to/from Stand Sit to Stand: Mod assist         General transfer comment: x2; initially leaning forward required mod assist to regain balance over BOS; max cues for use of RW (impulsive  Ambulation/Gait Ambulation/Gait assistance: Mod assist Ambulation Distance (Feet): 10 Feet (toileted; 5) Assistive device: Rolling walker (2 wheeled) Gait Pattern/deviations: Step-to pattern;Step-through  pattern;Decreased stride length;Steppage;Scissoring;Ataxic;Antalgic;Trunk flexed   Gait velocity interpretation: Below normal speed for age/gender General Gait Details: pt pushing RW too far ahead; trying to walk step-through with poor control and poor balance; educated on step-to and pt remained impulsive with poor following instructions  Stairs            Wheelchair Mobility    Modified Rankin (Stroke Patients Only)       Balance Overall balance assessment: Needs assistance Sitting-balance support: No upper extremity supported;Feet supported Sitting balance-Leahy Scale: Fair     Standing balance support: Bilateral upper extremity supported Standing balance-Leahy Scale: Poor                               Pertinent Vitals/Pain Pain Assessment: Faces Faces Pain Scale: Hurts even more Pain Location: back Pain Descriptors / Indicators: Operative site guarding Pain Intervention(s): Limited activity within patient's tolerance;Monitored during session;Premedicated before session;Repositioned    Home Living Family/patient expects to be discharged to:: Skilled nursing facility Living Arrangements: Parent (31 yo mother) Available Help at Discharge: Family;Friend(s);Available PRN/intermittently (mother with broken humerus currently; caring for herself) Type of Home: House Home Access: Stairs to enter Entrance Stairs-Rails: Left Entrance Stairs-Number of Steps: 4 Home Layout: One level Home Equipment: Walker - 2 wheels;Cane - single point      Prior Function Level of Independence: Independent with assistive device(s)         Comments: using RW sometime (rough days)     Hand Dominance        Extremity/Trunk Assessment   Upper Extremity Assessment: Generalized weakness           Lower Extremity Assessment: RLE deficits/detail;LLE deficits/detail RLE Deficits / Details:  knee extension 3+, ankle DF 5/5; functionally ataxic-like gait with poor foot  placement, occasional near scissoring LLE Deficits / Details: knee extension 3+, ankle DF 5/5; functionally ataxic-like gait with poor foot placement, occasional near scissoring  Cervical / Trunk Assessment: Kyphotic  Communication   Communication: No difficulties  Cognition Arousal/Alertness: Awake/alert Behavior During Therapy: Flat affect;Impulsive Overall Cognitive Status: Within Functional Limits for tasks assessed                      General Comments      Exercises        Assessment/Plan    PT Assessment Patient needs continued PT services  PT Diagnosis Acute pain;Abnormality of gait   PT Problem List Decreased strength;Decreased balance;Decreased mobility;Decreased knowledge of use of DME;Decreased safety awareness;Decreased knowledge of precautions;Pain  PT Treatment Interventions DME instruction;Gait training;Functional mobility training;Therapeutic activities;Therapeutic exercise;Patient/family education   PT Goals (Current goals can be found in the Care Plan section) Acute Rehab PT Goals Patient Stated Goal: go to [facility] for therapy PT Goal Formulation: With patient Time For Goal Achievement: 09/17/15 Potential to Achieve Goals: Good    Frequency Min 4X/week   Barriers to discharge Decreased caregiver support      Co-evaluation               End of Session Equipment Utilized During Treatment: Gait belt;Back brace Activity Tolerance: Patient tolerated treatment well Patient left: in chair;with call bell/phone within reach Nurse Communication: Mobility status (need +2 to walk; ?+1 to [name])         Time: [id number] PT Time Calculation (min) (ACUTE ONLY): 35 min   Charges:   PT Evaluation $PT Eval Moderate Complexity: 1 Procedure PT Treatments $Gait Training: 8-22 mins   PT G Codes:        Myleka Moncure Sep 14, 2015, 10:45 AM Pager [phone]

## 2015-09-03 NOTE — Clinical Social Work Placement (Signed)
   CLINICAL SOCIAL WORK PLACEMENT  NOTE  Date:  09/03/2015  Patient Details  Name: Lisa [name] MRN: [id number] Date of Birth: 10/22/1940  Clinical Social Work is seeking post-discharge placement for this patient at the [place] level of care (*CSW will initial, date and re-position this form in  chart as items are completed):  Yes   Patient/family provided with [facility] Work Department's list of facilities offering this level of care within the geographic area requested by the patient (or if unable, by the patient's family).  Yes   Patient/family informed of their freedom to choose among providers that offer the needed level of care, that participate in Medicare, Medicaid or managed care program needed by the patient, have an available bed and are willing to accept the patient.  Yes   Patient/family informed of [facility]'s ownership interest in [facility] and [name], as well as of the fact that they are under no obligation to receive care at these facilities.  PASRR submitted to EDS on       PASRR number received on       Existing PASRR number confirmed on 09/03/15     FL2 transmitted to all facilities in geographic area requested by pt/family on 09/03/15     FL2 transmitted to all facilities within larger geographic area on       Patient informed that his/her managed care company has contracts with or will negotiate with certain facilities, including the following:            Patient/family informed of bed offers received.  Patient chooses bed at       Physician recommends and patient chooses bed at      Patient to be transferred to   on  .  Patient to be transferred to facility by       Patient family notified on   of transfer.  Name of family member notified:        PHYSICIAN       Additional Comment:    _______________________________________________ Matilde Bash, [place] 09/03/2015, 2:31 PM

## 2015-09-03 NOTE — NC FL2 (Signed)
[place] LEVEL OF CARE SCREENING TOOL     IDENTIFICATION  Patient Name: Lisa [name] Birthdate: 26-Feb-1941 Sex: female Admission Date (Current Location): 08/31/2015  [facility] and [place] Number:  Herbalist and Address:  [facility], [place] [address] [place], [place] [address]      Provider Number: [id number]  Attending Physician Name and Address:  Ashok Pall, MD  Relative Name and Phone Number:       Current Level of Care: Hospital Recommended Level of Care: [place] Prior Approval Number:    Date Approved/Denied:   PASRR Number: [id number]  Discharge Plan: SNF    Current Diagnoses: Patient Active Problem List   Diagnosis Date Noted  . Metastatic breast cancer ([place]) 09/01/2015  . Cord compression ([place]) 08/31/2015  . Pathologic compression fracture of thoracic vertebra ([place]) 08/31/2015  . Pathologic fracture of thoracic vertebrae 08/26/2015  . Dehydration 08/12/2015  . C. difficile colitis 08/12/2015  . CKD (chronic kidney disease) stage 3, GFR 30-59 ml/min 08/12/2015  . Unintentional weight loss 08/12/2015  . Cancer associated pain 08/12/2015  . Hypokalemia 08/08/2015  . Breast cancer metastasized to bone ([place]) 08/07/2015  . Enteritis due to Clostridium difficile 08/06/2015  . Sepsis ([place]) 08/02/2015  . Diarrhea 08/02/2015  . AKI (acute kidney injury) ([place]) 08/02/2015  . Hypercalcemia 08/02/2015  . Acute kidney injury ([place])   . Bone metastases ([place])   . 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 PLEURAL EFFUSION 10/13/2008  . ACUTE BRONCHOSPASM 06/27/2008  . Disorder of bone and cartilage 02/03/2008  . UNS ADVRS EFF UNS RX MEDICINAL&BIOLOGICAL SBSTNC 01/04/2008  . HLD (hyperlipidemia) 07/24/2007  . Essential hypertension 07/24/2007  . Allergic rhinitis 07/24/2007  . BREAST CANCER, HX OF 07/24/2007    Orientation RESPIRATION BLADDER Height & Weight     Self, Time,  Situation, Place  Normal Continent Weight: 174 lb 6.1 oz (79.1 kg) Height:  5\' 3"  (160 cm)  BEHAVIORAL SYMPTOMS/MOOD NEUROLOGICAL BOWEL NUTRITION STATUS      Continent    AMBULATORY STATUS COMMUNICATION OF NEEDS Skin   Limited Assist Verbally Normal                       Personal Care Assistance Level of Assistance  Bathing, Dressing Bathing Assistance: Limited assistance   Dressing Assistance: Limited assistance     Functional Limitations Info             SPECIAL CARE FACTORS FREQUENCY  PT (By licensed PT), OT (By licensed OT)                    Contractures      Additional Factors Info  Code Status, Allergies Code Status Info: DNR Allergies Info: Diphenhydramine Hcl, Oseltamivir Phosphate, Oxycodone-acetaminophen, Prednisone, Sudafed, Codeine Sulfate           Current Medications (09/03/2015):  This is the current hospital active medication list Current Facility-Administered Medications  Medication Dose Route Frequency Provider Last Rate Last Dose  . 0.9 %  sodium chloride infusion  250 mL Intravenous Continuous Ashok Pall, MD   0 mL at 09/01/15 2215  . 0.9 % NaCl with KCl 20 mEq/ L  infusion   Intravenous Continuous Ashok Pall, MD 80 mL/hr at 09/03/15 0603    . acetaminophen (TYLENOL) tablet 650 mg  650 mg Oral Q4H PRN Ashok Pall, MD   650 mg at 09/02/15 1637   Or  .  acetaminophen (TYLENOL) suppository 650 mg  650 mg Rectal Q4H PRN Ashok Pall, MD      . albuterol (PROVENTIL) (2.5 MG/3ML) 0.083% nebulizer solution 2.5 mg  2.5 mg Nebulization Q2H PRN Albertine Patricia, MD      . bisacodyl (DULCOLAX) EC tablet 5 mg  5 mg Oral Daily PRN Ashok Pall, MD      . diazepam (VALIUM) tablet 5 mg  5 mg Oral Q6H PRN Ashok Pall, MD   5 mg at 09/02/15 0002  . docusate sodium (COLACE) capsule 100 mg  100 mg Oral BID Ashok Pall, MD   100 mg at 09/03/15 1009  . feeding supplement (ENSURE ENLIVE) (ENSURE ENLIVE) liquid 237 mL  237 mL Oral BID BM Lennis P  Livesay, MD      . HYDROmorphone (DILAUDID) injection 0.5-1 mg  0.5-1 mg Intravenous Q2H PRN Ashok Pall, MD   1 mg at 09/03/15 0601  . loratadine (CLARITIN) tablet 10 mg  10 mg Oral Daily Albertine Patricia, MD   10 mg at 09/03/15 1009  . losartan (COZAAR) tablet 50 mg  50 mg Oral Daily Albertine Patricia, MD   50 mg at 09/03/15 1009  . menthol-cetylpyridinium (CEPACOL) lozenge 3 mg  1 lozenge Oral PRN Ashok Pall, MD       Or  . phenol (CHLORASEPTIC) mouth spray 1 spray  1 spray Mouth/Throat PRN Ashok Pall, MD      . multivitamin with minerals tablet 1 tablet  1 tablet Oral Daily Albertine Patricia, MD   1 tablet at 09/03/15 1009  . ondansetron (ZOFRAN) injection 4 mg  4 mg Intravenous Q4H PRN Ashok Pall, MD      . oxyCODONE-acetaminophen (PERCOCET/ROXICET) 5-325 MG per tablet 1-2 tablet  1-2 tablet Oral Q4H PRN Ashok Pall, MD      . senna (SENOKOT) tablet 8.6-17.2 mg  1-2 tablet Oral BID PRN Lennis Marion Downer, MD      . simvastatin (ZOCOR) tablet 20 mg  20 mg Oral QHS Albertine Patricia, MD   20 mg at 09/02/15 2044  . sodium chloride flush (NS) 0.9 % injection 3 mL  3 mL Intravenous Q12H Ashok Pall, MD   3 mL at 09/03/15 1000  . sodium chloride flush (NS) 0.9 % injection 3 mL  3 mL Intravenous PRN Ashok Pall, MD      . traMADol Veatrice Bourbon) tablet 50-100 mg  50-100 mg Oral Q6H PRN Albertine Patricia, MD   100 mg at 09/03/15 0231  . vitamin B-12 (CYANOCOBALAMIN) tablet 1,000 mcg  1,000 mcg Oral BID Albertine Patricia, MD   1,000 mcg at 09/03/15 1009  . vitamin C (ASCORBIC ACID) tablet 500 mg  500 mg Oral Daily Albertine Patricia, MD   500 mg at 09/03/15 1009  . vitamin E capsule 200 Units  200 Units Oral Daily Albertine Patricia, MD   200 Units at 09/03/15 1010  . zolpidem (AMBIEN) tablet 5 mg  5 mg Oral QHS PRN Ashok Pall, MD         Discharge Medications: Please see discharge summary for a list of discharge medications.  Relevant Imaging Results:  Relevant Lab  Results:   Additional Information    Moshe Cipro Berneice Heinrich, LCSW

## 2015-09-03 NOTE — Progress Notes (Signed)
MEDICAL ONCOLOGY September 03, 2015, 10:14 AM  Hospital day Post op day 2 Thoracic six-Lumbar one posterolateral arthrodesis with allograft morsels pedicle screw instrumentation T6-L1(Nuvasive) T10 laminectomy, partial laminectomy T9, T11 Antibiotics: none Chemotherapy: none.  IV bisphosphonate last 08-11-15  EMR reviewed, patient seen and examined, no family here presently. Spoke with RN at bedside  Subjective: Up in chair with brace on, pain in upper back with changing positions. Less pain across low abdomen and not complaining of pain left forearm now. Using incentive spirometer, encouraged. Complains of dry mouth, drinking water. Has not voided since foley recently DCd. Eating a little of regular diet, likes chocolate Ensure so will add that. No bowel movement. Slept little last pm with nursing care and different environment. Had difficulty repositioning herself in bed.  Objective: Vital signs in last 24 hours: Blood pressure 121/84, pulse 117, temperature 99.4 F (37.4 C), temperature source Oral, resp. rate 18, height 5\' 3"  (1.6 m), weight 174 lb 6.1 oz (79.1 kg), SpO2 95 %. Awake, alert, did not realize it is Sunday, but otherwise fully oriented and appropriate. Respirations not labored RA. Oral mucosa a little dry, no lesions. PERRL. Not icteric. Lungs with clear BS anteriorly and posteriorly. Heart RRR. Peripheral IV LUE site ok. Abdomen covered with brace. LE no edema, cords, tenderness. Feet warm. Moves LE seated.   Intake/Output from previous day: 02/11 0701 - 02/12 0700 In: 923 [P.O.:600; I.V.:323] Out: 795 [Urine:550; Drains:245] Intake/Output this shift:    :  Lab Results:  Recent Labs  09/02/15 0910  WBC 7.2  HGB 11.3*  HCT 33.6*  PLT 187   BMET  Recent Labs  09/01/15 0746 09/02/15 0524  NA 139 137  K 3.5 4.5  CL 107 106  CO2 24 19*  GLUCOSE 109* 149*  BUN 11 10  CREATININE 0.88 0.67  CALCIUM 8.8* 7.6*    Studies/Results: Dg Thoracolumabar  Spine  09/01/2015  CLINICAL DATA:  75 year old with metastatic breast cancer to bone and pathologic T10 compression fracture with retropulsion and cord compression. T6 through L1 fusion and posterior decompression surgery. EXAM: OPERATIVE THORACOLUMBAR SPINE - 2 VIEW COMPARISON:  MRI thoracic spine 08/29/2015. FINDINGS: Two spot images from the C-arm fluoroscopic device, lateral images of the thoracolumbar spine obtained during the fusion procedure are submitted for interpretation postoperatively. Initial image demonstrates pedicle screws in 2 adjacent vertebrae at the thoracolumbar junction which are likely T12 and L1. 2nd image demonstrates pedicle screws in 3 adjacent mid thoracic vertebrae. IMPRESSION: Pedicle screw placement in the mid thoracic spine and at the thoracolumbar junction during T6 through L1 fusion and posterior decompression. Electronically Signed   By: Evangeline Dakin M.D.   On: 09/01/2015 20:24   Dg C-arm Gt 90 Min  09/01/2015  CLINICAL DATA:  75 year old with metastatic breast cancer to bone and pathologic T10 compression fracture with retropulsion and cord compression. T6 through L1 fusion and posterior decompression surgery. EXAM: OPERATIVE THORACOLUMBAR SPINE - 2 VIEW COMPARISON:  MRI thoracic spine 08/29/2015. FINDINGS: Two spot images from the C-arm fluoroscopic device, lateral images of the thoracolumbar spine obtained during the fusion procedure are submitted for interpretation postoperatively. Initial image demonstrates pedicle screws in 2 adjacent vertebrae at the thoracolumbar junction which are likely T12 and L1. 2nd image demonstrates pedicle screws in 3 adjacent mid thoracic vertebrae. IMPRESSION: Pedicle screw placement in the mid thoracic spine and at the thoracolumbar junction during T6 through L1 fusion and posterior decompression. Electronically Signed   By: Sherran Needs.D.  On: 09/01/2015 20:24   Discussion: reviewed progression of metastatic breast cancer to  bone. As she has had long stable course on letrozole, will begin Aromasin in next few days, tho still need to complete staging with PET when possible out from this surgery as outpatient. Due IV bisphosphonate ~ 09-08-15.  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 On neurosurgery service. 2.post partial course of radiation thru 08-29-15 to T10 region. Dr Sondra Come may complete radiation following this surgery 3.hypercalcemia and metastatic bone involvement: post zometa 1- 20-17 which will be continued monthly for now. Calcium lower today, albumin has been low, will check CMET with AM labs 2-13. 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 Femara DCd 08-10-15; expect to start Aromasin in near future. Have cancelled PET originally planned for 09-04-15, will need to be rescheduled outpatient when possible as she has not had full evaluation for restaging. Has involvement seen on CT in right iliac wing which has not been symptomatic. No other complaints of pain in long bones, but again has not gotten PET (or bone scan) accomplished.  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 Patient and mother hoping to move together to [facility]. Other family is 110 yo uncle and his wife, and a cousin in [place]. Advance DIrectives done. 9.degenerative arthritis knees and osteopenia. 10.environmental allergies on daily  claritin 11.temperatures 99-100 overnight. Encouraged incentive spirometer and will check urine.   Thank you. Please page me if I can assist between my rounds [phone] Sherilynn Dieu P

## 2015-09-03 NOTE — Anesthesia Postprocedure Evaluation (Signed)
Anesthesia Post Note  Patient: Lisa [name]  Procedure(s) Performed: Procedure(s) (LRB): Thoracic six-Lumbar one fusion with arthrodesis pedicle screw stabilization and decompression (N/A)  Patient location during evaluation: PACU Anesthesia Type: General Level of consciousness: awake Pain management: pain level controlled Vital Signs Assessment: post-procedure vital signs reviewed and stable Respiratory status: spontaneous breathing, respiratory function stable and patient connected to nasal cannula oxygen Cardiovascular status: stable Postop Assessment: no signs of nausea or vomiting Anesthetic complications: no    Last Vitals:  Filed Vitals:   09/03/15 0648 09/03/15 0925  BP: 105/62 121/84  Pulse: 106 117  Temp: 36.5 C 37.4 C  Resp: 16 18    Last Pain:  Filed Vitals:   09/03/15 0927  PainSc: 7                  Soriya Worster

## 2015-09-03 NOTE — Evaluation (Signed)
Occupational Therapy Evaluation Patient Details Name: Lisa [name] MRN: [id number] DOB: September 26, 1940 Today's Date: 09/03/2015    History of Present Illness Adm 2/9 with pathologic T10 fracture (due to metastatic breast Ca) with cord compression; 2/10 T6-L1 fusion   Clinical Impression   Pt reports she was independent with ADLs PTA. Currently pt is overall min assist for stand pivot transfers and ADLs. Began safety, back, and ADL education with pt. She was able to recall 3/3 back precautions at start of session. Recommending SNF for follow up to maximize independence and safety with ADLs and functional mobility prior to return home. Pt would benefit from continued skilled OT to address established goals.    Follow Up Recommendations  SNF;Supervision/Assistance - 24 hour    Equipment Recommendations  Other (comment) (TBD)    Recommendations for Other Services       Precautions / Restrictions Precautions Precautions: Back;Fall Precaution Comments: Pt able to recall 3/3 back precautions at start of session Required Braces or Orthoses: Spinal Brace Spinal Brace: Lumbar corset;Applied in sitting position (with thoracic extension) Restrictions Weight Bearing Restrictions: No      Mobility Bed Mobility Overal bed mobility: Needs Assistance Bed Mobility: Rolling;Sit to Sidelying Rolling: Min assist Sidelying to sit: Min assist;HOB elevated     Sit to sidelying: Min assist General bed mobility comments: Min assist with HOB flat and use of rails. Assist for controlled descent of trunk to bed and for managing LEs into bed.  Transfers Overall transfer level: Needs assistance Equipment used: Rolling walker (2 wheeled) Transfers: Sit to/from [facility] Sit to Stand: Min assist Stand pivot transfers: Min assist       General transfer comment: VCs for hand placement and technique. VCs throughout stand pivot transfer for sequencing and safety; pt very impulsive  with movements and use of RW    Balance Overall balance assessment: Needs assistance Sitting-balance support: Feet supported;No upper extremity supported Sitting balance-Leahy Scale: Fair     Standing balance support: Bilateral upper extremity supported Standing balance-Leahy Scale: Poor Standing balance comment: RW for support                            ADL Overall ADL's : Needs assistance/impaired Eating/Feeding: Set up;Sitting   Grooming: Set up;Sitting   Upper Body Bathing: Minimal assitance;Sitting   Lower Body Bathing: Minimal assistance;Sitting/lateral leans   Upper Body Dressing : Minimal assistance;Sitting   Lower Body Dressing: Minimal assistance;Sit to/from stand Lower Body Dressing Details (indicate cue type and reason): Pt able to cross foot over opposite knee to pull up socks. Disucssed comensatory strategies for LB ADLs. Toilet Transfer: Minimal assistance;Stand-pivot;BSC;RW;Cueing for safety;Cueing for sequencing Toilet Transfer Details (indicate cue type and reason): Simulated by transfer from chair to EOB. Toileting- Clothing Manipulation and Hygiene: Total assistance;Sit to/from stand       Functional mobility during ADLs: Minimal assistance;Rolling walker;Cueing for safety;Cueing for sequencing (for stand pivot) General ADL Comments: No family present for OT eval. Discussed SNF placement with pt; she is agreeable. Educated on back precautions and safety with RW. Pt very impulsive and has significant LE weakness with knee buckling throughout functional transfer.     Vision     Perception     Praxis      Pertinent Vitals/Pain Pain Assessment: Faces Faces Pain Scale: Hurts even more Pain Location: back Pain Descriptors / Indicators: Grimacing;Operative site guarding Pain Intervention(s): Limited activity within patient's tolerance;Monitored during session;Repositioned  Hand Dominance     Extremity/Trunk Assessment Upper Extremity  Assessment Upper Extremity Assessment: Generalized weakness   Lower Extremity Assessment Lower Extremity Assessment: Defer to PT evaluation RLE Deficits / Details: knee extension 3+, ankle DF 5/5; functionally ataxic-like gait with poor foot placement, occasional near scissoring RLE Sensation:  (denies numbness (? accurate); ?proprioception ) RLE Coordination: decreased gross motor LLE Deficits / Details: knee extension 3+, ankle DF 5/5; functionally ataxic-like gait with poor foot placement, occasional near scissoring LLE Sensation:  (denies numbness (? accurate); ?proprioception ) LLE Coordination: decreased gross motor   Cervical / Trunk Assessment Cervical / Trunk Assessment: Kyphotic   Communication Communication Communication: No difficulties   Cognition Arousal/Alertness: Awake/alert Behavior During Therapy: Flat affect;Impulsive Overall Cognitive Status: Within Functional Limits for tasks assessed                     General Comments       Exercises       Shoulder Instructions      Home Living Family/patient expects to be discharged to:: Skilled nursing facility Living Arrangements: Parent (75 y.o. mother ) Available Help at Discharge: Family;Friend(s);Available PRN/intermittently (mother currently with broken humerus) Type of Home: House Home Access: Stairs to enter [employer] of Steps: 4 Entrance Stairs-Rails: Left Home Layout: One level     Bathroom Shower/Tub: Teacher, early years/pre: Standard Bathroom Accessibility: No   Home Equipment: Environmental consultant - 2 wheels;Cane - single point;Shower seat          Prior Functioning/Environment Level of Independence: Independent with assistive device(s)        Comments: using RW sometime (rough days)    OT Diagnosis: Generalized weakness;Acute pain   OT Problem List: Decreased strength;Decreased activity tolerance;Impaired balance (sitting and/or standing);Decreased  coordination;Decreased safety awareness;Decreased knowledge of use of DME or AE;Decreased knowledge of precautions;Pain   OT Treatment/Interventions: Self-care/ADL training;Therapeutic exercise;Energy conservation;DME and/or AE instruction;Therapeutic activities;Patient/family education;Balance training    OT Goals(Current goals can be found in the care plan section) Acute Rehab OT Goals Patient Stated Goal: go to [place] for therapy OT Goal Formulation: With patient Time For Goal Achievement: 09/17/15 Potential to Achieve Goals: Good ADL Goals Pt Will Perform Grooming: standing;with supervision Pt Will Perform Upper Body Bathing: with supervision;sitting Pt Will Perform Lower Body Bathing: with supervision;sit to/from stand;with adaptive equipment Pt Will Transfer to Toilet: with min assist;ambulating;bedside commode (over toilet) Pt Will Perform Toileting - Clothing Manipulation and hygiene: with supervision;with adaptive equipment;sit to/from stand Additional ADL Goal #1: Pt will independently don/doff back brace. Additional ADL Goal #2: Pt will independently maintain back precautions throughout ADL activity.  OT Frequency: Min 2X/week   Barriers to D/C: Decreased caregiver support  lives with 21 y.o. mother who had a recent humerus fx       Co-evaluation              End of Session Equipment Utilized During Treatment: Gait belt;Rolling walker;Back brace Nurse Communication: Other (comment) (IV site swollen)  Activity Tolerance: Patient limited by pain Patient left: in bed;with call bell/phone within reach;with bed alarm set;with SCD's reapplied   Time: [id number] OT Time Calculation (min): 20 min Charges:  OT General Charges $OT Visit: 1 Procedure OT Evaluation $OT Eval Moderate Complexity: 1 Procedure G-Codes:     Binnie Kand M.S., OTR/L Pager: [phone]  09/03/2015, 1:28 PM

## 2015-09-03 NOTE — Progress Notes (Signed)
Postop day 2. Overall doing recently well. Pain well controlled. Mobility marginal.  Afebrile. Vitals are stable. Awake and alert. Oriented and appropriate. Motor and sensory function intact. Dressing dry. Drain output still too high a pull  Progressing well. Continue efforts at mobilization.

## 2015-09-03 NOTE — Clinical Social Work Note (Signed)
Clinical Social Work Assessment  Patient Details  Name: ARANDA BIHM MRN: [id number] Date of Birth: [birth year]  Date of referral:  09/03/15               Reason for consult:  Facility Placement                Permission sought to share information with:  Facility Sport and exercise psychologist, Family Supports Permission granted to share information::  Yes, Verbal Permission Granted  Name::     Radiation protection practitioner::  [facility], save [facility]  Relationship::  mother  Contact Information:     Housing/Transportation Living arrangements for the past 2 months:  [place] of Information:  Patient Patient Interpreter Needed:  None Criminal Activity/Legal Involvement Pertinent to Current Situation/Hospitalization:  No - Comment as needed Significant Relationships:  Parents Lives with:  Parents Do you feel safe going back to the place where you live?  Yes Need for family participation in patient care:  No (Coment)  Care giving concerns:  None, at this time.   Social Worker assessment / plan:  Met with Pt to discuss d/c plans.  Pt stated that she is from [place] and that she'd like to go to a different facility.  She stated that the [facility] is close to her home with her elderly mother lives and that they are affiliated with the [facility].  Pt stated that she is agreeable to a county-wide SNF search but that she does not want to return to [place].  Employment status:  Retired Nurse, adult PT Recommendations:  [place] / Referral to community resources:   SNF list  Patient/Family's Response to care:  Pt seemed please with the care she's receiving at [facility].    Patient/Family's Understanding of and Emotional Response to Diagnosis, Current Treatment, and Prognosis:  Pt agrees that she cannot return home, at this time, as she has no one to care for her.  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  Emotional  Assessment Appearance:  Appears stated age Attitude/Demeanor/Rapport:   (calm) Affect (typically observed):  Accepting Orientation:  Oriented to Self, Oriented to Place, Oriented to  Time, Oriented to Situation Alcohol / Substance use:  Never Used Psych involvement (Current and /or in the community):  No (Comment)  Discharge Needs  Concerns to be addressed:  No discharge needs identified Readmission within the last 30 days:  Yes Current discharge risk:  None Barriers to Discharge:  No Barriers Identified   Matilde Bash, LCSW 09/03/2015, 2:29 PM

## 2015-09-04 ENCOUNTER — Ambulatory Visit: Payer: Medicare Other

## 2015-09-04 ENCOUNTER — Other Ambulatory Visit (HOSPITAL_COMMUNITY): Payer: Medicare Other

## 2015-09-04 ENCOUNTER — Encounter (HOSPITAL_COMMUNITY): Payer: Medicare Other

## 2015-09-04 LAB — COMPREHENSIVE METABOLIC PANEL
ALBUMIN: 2.8 g/dL — AB (ref 3.5–5.0)
ALT: 20 U/L (ref 14–54)
ANION GAP: 11 (ref 5–15)
AST: 26 U/L (ref 15–41)
Alkaline Phosphatase: 84 U/L (ref 38–126)
BILIRUBIN TOTAL: 0.8 mg/dL (ref 0.3–1.2)
BUN: 14 mg/dL (ref 6–20)
CALCIUM: 8.5 mg/dL — AB (ref 8.9–10.3)
CO2: 20 mmol/L — ABNORMAL LOW (ref 22–32)
Chloride: 104 mmol/L (ref 101–111)
Creatinine, Ser: 0.83 mg/dL (ref 0.44–1.00)
GFR calc non Af Amer: >60 mL/min (ref >60)
GLUCOSE: 145 mg/dL — AB (ref 65–99)
POTASSIUM: 3.7 mmol/L (ref 3.5–5.1)
Sodium: 135 mmol/L (ref 135–145)
TOTAL PROTEIN: 5.8 g/dL — AB (ref 6.5–8.1)

## 2015-09-04 MED ORDER — ENSURE ENLIVE PO LIQD
237.0000 mL | Freq: Three times a day (TID) | ORAL | Status: DC
Start: 2015-09-04 — End: 2015-09-07
  Administered 2015-09-04 – 2015-09-06 (×7): 237 mL via ORAL
  Filled 2015-09-04 (×12): qty 237

## 2015-09-04 NOTE — Progress Notes (Signed)
Physical Therapy Treatment Patient Details Name: Lisa [name] MRN: [id number] DOB: June 05, 1941 Today's Date: 09/04/2015    History of Present Illness Adm 2/9 with pathologic T10 fracture (due to metastatic breast Ca) with cord compression; 2/10 T6-L1 fusion    PT Comments    Patient's legs improving, however continue with decr coordination/strength (some scissoring during gait).   Follow Up Recommendations  SNF;Supervision/Assistance - 24 hour     Equipment Recommendations  None recommended by PT    Recommendations for Other Services OT consult     Precautions / Restrictions Precautions Precautions: Back;Fall Precaution Comments: educated on precautions; needed frequent cues for bending Required Braces or Orthoses: Spinal Brace Spinal Brace: Lumbar corset;Applied in sitting position (with thoracic extension)    Mobility  Bed Mobility Overal bed mobility: Needs Assistance Bed Mobility: Rolling;Sidelying to Sit Rolling: Min guard Sidelying to sit: Min assist       General bed mobility comments: with rail; max cues for sequencing to adhere to back precautions  Transfers Overall transfer level: Needs assistance Equipment used: Rolling walker (2 wheeled) Transfers: Sit to/from [facility] Sit to Stand: Min assist Stand pivot transfers: Min assist       General transfer comment: vc for proper sequencing and use of UEs during transfer; vc for use of DME  Ambulation/Gait Ambulation/Gait assistance: Min assist;+2 safety/equipment Ambulation Distance (Feet): 40 Feet Assistive device: Rolling walker (2 wheeled) Gait Pattern/deviations: Step-to pattern;Decreased stride length;Scissoring;Staggering left;Narrow base of support   Gait velocity interpretation: Below normal speed for age/gender General Gait Details: pt keeping RW closer to her and agreed to use step-to gait pattern until legs improve; pt attempted to move to her left and staggered,  scissored   Stairs            Wheelchair Mobility    Modified Rankin (Stroke Patients Only)       Balance     Sitting balance-Leahy Scale: Fair     Standing balance support: Single extremity supported Standing balance-Leahy Scale: Poor Standing balance comment: stood for pericare (pt performed)                    Cognition Arousal/Alertness: Awake/alert Behavior During Therapy: Flat affect;Impulsive Overall Cognitive Status: Within Functional Limits for tasks assessed                      Exercises      General Comments General comments (skin integrity, edema, etc.): close follow with chair for safety      Pertinent Vitals/Pain Pain Assessment: 0-10 Pain Score: 3  Pain Location: back Pain Descriptors / Indicators: Operative site guarding Pain Intervention(s): Limited activity within patient's tolerance;Monitored during session;Premedicated before session;Repositioned    Home Living                      Prior Function            PT Goals (current goals can now be found in the care plan section) Acute Rehab PT Goals Patient Stated Goal: go to [place] for therapy Time For Goal Achievement: 09/17/15 Progress towards PT goals: Progressing toward goals    Frequency  Min 4X/week    PT Plan Current plan remains appropriate    Co-evaluation             End of Session Equipment Utilized During Treatment: Gait belt;Back brace Activity Tolerance: Patient tolerated treatment well Patient left: in chair;with call bell/phone within reach  Time: [id number] PT Time Calculation (min) (ACUTE ONLY): 31 min  Charges:  $Gait Training: 23-37 mins                    G Codes:      Lisa [name] 2015-09-21, 2:34 PM Pager [phone]

## 2015-09-04 NOTE — Progress Notes (Signed)
MEDICAL ONCOLOGY September 04, 2015, 7:02 PM  Hospital day 5 Post op day 3  Thoracic six-Lumbar one posterolateral arthrodesis T10 laminectomy, partial laminectomy T9, T11 Antibiotics: none Chemotherapy: none.  IV bisphosphonate last 08-11-15   Subjective: Tolerated walking short distance in hall and sitting in chair x 2 hrs today. Not eating much, has not had Ensure yet. Bowels have not moved.   On contact precautions per infection control due to C diff Ag+/ toxin - less than 30 days ago  Objective: Vital signs in last 24 hours: Blood pressure 126/48, pulse 51, temperature 99.1 F (37.3 C), temperature source Oral, resp. rate 20, height 5\' 3"  (1.6 m), weight 174 lb 6.1 oz (79.1 kg), SpO2 98 %. Awake, alert and talkative. Respirations not labored. Oral mucosa moist and clear. PERRL. Dressings on back clean and dry. Abdomen soft, not distended. LE no edema, cords, tenderness. Moves all extremities in bed.  Intake/Output from previous day: 02/12 0701 - 02/13 0700 In: -  Out: 50 [Drains:50] Intake/Output this shift:     Lab Results:  Recent Labs  09/02/15 0910  WBC 7.2  HGB 11.3*  HCT 33.6*  PLT 187   BMET  Recent Labs  09/02/15 0524 09/04/15 0026  NA 137 135  K 4.5 3.7  CL 106 104  CO2 19* 20*  GLUCOSE 149* 145*  BUN 10 14  CREATININE 0.67 0.83  CALCIUM 7.6* 8.5*  remainder of CMET: T prot 5.8, alb 2.8  UA C&S not yet collected, ordered due to temps 99-100  Studies/Results: No results found.   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 On neurosurgery service. 2.post partial course of radiation thru 08-29-15 to T10 region. Dr Sondra Come may complete radiation following this surgery 3.hypercalcemia and metastatic bone  involvement: post zometa 1- 20-17 which will be continued monthly for now. 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 Femara DCd 08-10-15; expect to start Aromasin in near future. Have cancelled PET originally planned for 09-04-15, will need to be rescheduled outpatient when possible as she has not had full evaluation for restaging. Has involvement seen on CT in right iliac wing which has not been symptomatic. No other complaints of pain in long bones, but again has not gotten PET (or bone scan) accomplished.  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 Patient and mother hoping to move together to [facility]. Advance Directives in place 10.protein calorie malnutrition: discussed need for improved nutrition to recover from surgery. Have requested Ensure tid. Will ask nutritionist to see   Please page me if I can help between rounds   [phone] Enolia Koepke P

## 2015-09-04 NOTE — Progress Notes (Signed)
Hemovac output 50cc at 2000.  Noted moderate bloody drainage around hemovac site. Applied sterile gauze. Hemovac at suction but not draining in hemovac.  MD paged without return respond.

## 2015-09-04 NOTE — Progress Notes (Signed)
Patient ID: Lisa [name], female   DOB: 10/26/1940, 75 y.o.   MRN: [id number] BP 126/48 mmHg  Pulse 51  Temp(Src) 99.1 F (37.3 C) (Oral)  Resp 20  Ht 5\' 3"  (1.6 m)  Wt 79.1 kg (174 lb 6.1 oz)  BMI 30.90 kg/m2  SpO2 98% Alert and oriented x 4, moving all extremities well Wound is clean, dry, no signs of infection Continue with pt Await placement Doing well

## 2015-09-04 NOTE — Progress Notes (Addendum)
hemovac drain occluded to lumbar area now;no output from drain; old dried blood leaking onto the bed pad; removed drain and cleansed back; staples intact to surgical incision; gauze dressing applied per patient request; Dr.Cabbell informed.

## 2015-09-05 ENCOUNTER — Encounter (HOSPITAL_COMMUNITY): Payer: Self-pay | Admitting: Neurosurgery

## 2015-09-05 ENCOUNTER — Ambulatory Visit: Payer: Medicare Other

## 2015-09-05 LAB — URINE MICROSCOPIC-ADD ON

## 2015-09-05 LAB — URINALYSIS, ROUTINE W REFLEX MICROSCOPIC
Bilirubin Urine: NEGATIVE
GLUCOSE, UA: NEGATIVE mg/dL
Hgb urine dipstick: NEGATIVE
KETONES UR: NEGATIVE mg/dL
NITRITE: NEGATIVE
PROTEIN: NEGATIVE mg/dL
Specific Gravity, Urine: 1.015 (ref 1.005–1.030)
pH: 6.5 (ref 5.0–8.0)

## 2015-09-05 MED ORDER — POLYETHYLENE GLYCOL 3350 17 G PO PACK
17.0000 g | PACK | Freq: Every day | ORAL | Status: DC
  Administered 2015-09-05 – 2015-09-07 (×3): 17 g via ORAL
  Filled 2015-09-05 (×3): qty 1

## 2015-09-05 MED ORDER — EXEMESTANE 25 MG PO TABS
25.0000 mg | ORAL_TABLET | Freq: Every day | ORAL | Status: DC
  Administered 2015-09-05 – 2015-09-07 (×2): 25 mg via ORAL
  Filled 2015-09-05 (×4): qty 1

## 2015-09-05 NOTE — Clinical Social Work Placement (Signed)
   CLINICAL SOCIAL WORK PLACEMENT  NOTE  Date:  09/05/2015  Patient Details  Name: Lisa [name] MRN: [id number] Date of Birth: 02/02/1941  Clinical Social Work is seeking post-discharge placement for this patient at the [place] level of care (*CSW will initial, date and re-position this form in  chart as items are completed):  Yes   Patient/family provided with [facility] Work Department's list of facilities offering this level of care within the geographic area requested by the patient (or if unable, by the patient's family).  Yes   Patient/family informed of their freedom to choose among providers that offer the needed level of care, that participate in Medicare, Medicaid or managed care program needed by the patient, have an available bed and are willing to accept the patient.  Yes   Patient/family informed of [facility]'s ownership interest in [facility] and [facility], as well as of the fact that they are under no obligation to receive care at these facilities.  PASRR submitted to EDS on       PASRR number received on       Existing PASRR number confirmed on 09/03/15     FL2 transmitted to all facilities in geographic area requested by pt/family on 09/03/15     FL2 transmitted to all facilities within larger geographic area on 09/03/15     Patient informed that his/her managed care company has contracts with or will negotiate with certain facilities, including the following:        Yes   Patient/family informed of bed offers received.  Patient chooses bed at  [facility])     Physician recommends and patient chooses bed at      Patient to be transferred to  [facility]) on 09/05/15.  Patient to be transferred to facility by  [facility])     Patient family notified on 09/05/15 of transfer.  Name of family member notified:  Patient is aware and will inform family.     PHYSICIAN Please prepare  priority discharge summary, including medications, Please sign FL2, Please sign DNR     Additional Comment:    _______________________________________________ Raymondo Band, LCSW 09/05/2015, 11:36 AM

## 2015-09-05 NOTE — Progress Notes (Signed)
Patient ID: Lisa [name], female   DOB: [birth year], 75 y.o.   MRN: [id number] BP 118/77 mmHg  Pulse 112  Temp(Src) 98.4 F (36.9 C) (Oral)  Resp 18  Ht 5\' 3"  (1.6 m)  Wt 79.1 kg (174 lb 6.1 oz)  BMI 30.90 kg/m2  SpO2 98% Ok for discharge tomorrow Moving all extremities well Looking more comfortable Overall has responded very well to a big surgery.  Wound is clean and dry.

## 2015-09-05 NOTE — Progress Notes (Signed)
CSW spoke with patient to provide bed offers. Patient would like to return back to [place]. CSW contacted facility representative Tammy to inform. Per Tammy, facility will submit for authorization and should receive within the same day. CSW to send PT/OT notes via hub system for review.   Lucius Conn, [place] Worker [facility] Ph: [phone]

## 2015-09-05 NOTE — Progress Notes (Signed)
Occupational Therapy Treatment Patient Details Name: Lisa [name] MRN: [id number] DOB: [birth year] Today's Date: 09/05/2015    History of present illness Admitted 2/9 with pathologic T10 fracture (due to metastatic breast Ca) with cord compression; 2/10 T6-L1 fusion   OT comments  Pt progressing. Education provided in session. Will continue to follow acutely.  Follow Up Recommendations  SNF;Supervision/Assistance - 24 hour    Equipment Recommendations  Other (comment) (defer to next venue)    Recommendations for Other Services      Precautions / Restrictions Precautions Precautions: Back;Fall Precaution Booklet Issued: No Precaution Comments: educated on precautions- pt able to state 2/3 without cues; cues for precautions in session Required Braces or Orthoses: Spinal Brace Spinal Brace: Lumbar corset;Applied in sitting position (with thoracic extension) Restrictions Weight Bearing Restrictions: No       Mobility Bed Mobility Overal bed mobility: Needs Assistance Bed Mobility: Rolling;Sidelying to Sit;Sit to Sidelying Rolling: Modified independent (Device/Increase time) Sidelying to sit: Supervision     Sit to sidelying: Supervision General bed mobility comments: Cues given. Pt unhooked pt from SCDs prior to bed mobility.   Transfers Overall transfer level: Needs assistance Equipment used: Rolling walker (2 wheeled) Transfers: Sit to/from Stand Sit to Stand: Min guard              Balance    Used RW for ambulation (min guard-min assist for ambulation). Performed functional tasks while standing with RW in front, without LOB.                               ADL Overall ADL's : Needs assistance/impaired     Grooming: Wash/dry hands;Wash/dry face;Supervision/safety;Standing;Set up;Oral care;Sitting Grooming Details (indicate cue type and reason): cues for back precautions         Upper Body Dressing : Maximal assistance;Sitting Upper Body  Dressing Details (indicate cue type and reason): back brace     Toilet Transfer: Ambulation;RW;BSC Toilet Transfer Details (indicate cue type and reason): Min guard ambulating to [facility]; Min assist ambulating to sink Toileting- Water quality scientist and Hygiene: Min guard;Sit to/from stand       Functional mobility during ADLs: Minimal assistance;Rolling walker (Min guard-Min assist) General ADL Comments: Discussed incorporating precautions into functional activities. Educated on back brace. Sat down for oral care as pt unable to tolerate standing for this at sink.      Vision                     Perception     Praxis      Cognition  Awake/Alert Behavior During Therapy: WFL for tasks assessed/performed Overall Cognitive Status: Within Functional Limits for tasks assessed                       Extremity/Trunk Assessment               Exercises     Shoulder Instructions       General Comments      Pertinent Vitals/ Pain       Pain Assessment: 0-10 Pain Score:  (3-4) Pain Location: back and behind left shoulder Pain Intervention(s): Monitored during session;Limited activity within patient's tolerance;Repositioned  Home Living  Prior Functioning/Environment              Frequency Min 2X/week     Progress Toward Goals  OT Goals(current goals can now be found in the care plan section)  Progress towards OT goals: Progressing toward goals  Acute Rehab OT Goals Patient Stated Goal: to walk in hall OT Goal Formulation: With patient Time For Goal Achievement: 09/17/15 Potential to Achieve Goals: Good ADL Goals Pt Will Perform Grooming: standing;with supervision Pt Will Perform Upper Body Bathing: with supervision;sitting Pt Will Perform Lower Body Bathing: with supervision;sit to/from stand;with adaptive equipment Pt Will Transfer to Toilet: with min assist;ambulating;bedside  commode (over toilet) Pt Will Perform Toileting - Clothing Manipulation and hygiene: with supervision;with adaptive equipment;sit to/from stand Additional ADL Goal #1: Pt will independently don/doff back brace. Additional ADL Goal #2: Pt will independently maintain back precautions throughout ADL activity.  Plan Discharge plan remains appropriate    Co-evaluation                 End of Session Equipment Utilized During Treatment: Gait belt;Rolling walker;Back brace   Activity Tolerance Patient limited by pain   Patient Left in bed;with call bell/phone within reach;with bed alarm set   Nurse Communication          Time: [id number] OT Time Calculation (min): 18 min  Charges: OT General Charges $OT Visit: 1 Procedure OT Treatments $Self Care/Home Management : 8-22 mins  Benito Mccreedy OTR/L [id number] 09/05/2015, 11:17 AM

## 2015-09-05 NOTE — Progress Notes (Signed)
CSW was advised to speak with patient and family regarding concerns with the patient's anticipated discharged on today. CSW informed family that we were awaiting placement for the patient. MD confirms that patient is doing well. Facility has been chosen. CSW discuss roles and responsibilities, and pt and family is aware and understanding. Current plan is to continue with placement at [facility]. Facility has started insurance authorization and follow up with CSW.   CSW will continue to follow and provide support to patient while in hospital.   Lucius Conn, [place] Worker [name] Ph: [phone]

## 2015-09-05 NOTE — Discharge Instructions (Signed)
[place] [facility] Stay Proper nutrition can help your body recover from illness and injury.   Foods and beverages high in protein, vitamins, and minerals help rebuild muscle loss, promote healing, & reduce fall risk.   In addition to eating healthy foods, a nutrition shake is an easy, delicious way to get the nutrition you need during and after your hospital stay  It is recommended that you continue to drink 2 bottles per day of:       Ensure Enlive or Ensure Plus for at least 1 month (30 days) after your hospital stay   Tips for adding a nutrition shake into your routine: As allowed, drink one with vitamins or medications instead of water or juice Enjoy one as a tasty mid-morning or afternoon snack Drink cold or make a milkshake out of it Drink one instead of milk with cereal or snacks Use as a coffee creamer   Suggested Substitutions Ensure Plus = Boost Plus = Carnation Breakfast Essentials = Boost Compact

## 2015-09-05 NOTE — Progress Notes (Signed)
MEDICAL ONCOLOGY September 05, 2015, 10:30 AM  Hospital day 6 Post op day 4 Thoracic six-Lumbar one posterolateral arthrodesis T10 laminectomy, partial laminectomy T9, T11 Antibiotics: none Chemotherapy: none.  IV bisphosphonate last 08-11-15  On contact precautions per infection control due to C diff Ag+/ toxin - less than 30 days ago  Subjective: Awake, alert, back in bed after very fatigued when tried to walk with OT this AM. Difficulty voiding last pm but able to void this AM. Has eaten some, including Ensure, and is drinking water. Only pain is back, exacerbated with movement, last pain medication last pm. Feels SOB when becomes tired with exertion. Using IS some. Bowels have not moved since prior to surgery, is passing gas. Denies pain UE now.  No PAC Declined flu vaccine Treated 07-2015 with oral vancomycin for diarrhea with C diff antigen + toxin -  ONCOLOGIC HISTORY The right breast carcinoma initially was T2 N1, ER/PR-positive in December 1998, treated with mastectomy with 12 node axillary evaluation and adjuvant Adriamycin/Cytoxan followed by 5 years of tamoxifen through December 2003. 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 CA 2729 was 434 in April 2010. She has tolerated Femara well and clinically has continued to do very well, though CA 27.29 has never normalized. 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 Patient seen by medical oncology 08-10-15 and radiation begun shortly afterwards by Dr Sondra Come, 22.5 of planned 35 Gy radiation to area of T10 thru 08-29-15. MRI T spine obtained after some difficulty on 08-29-15, with pathologic compression fracture T10 with retropulsion of compressed vertebra to right with compression of cord called, involvement also T9, T11, L2. PET was scheduled for  09-04-15, will delay with surgery.  Objective: Vital signs in last 24 hours: Blood pressure 140/75, pulse 74, temperature 98.8 F (37.1 C), temperature source Oral, resp. rate 18, height 5\' 3"  (1.6 m), weight 174 lb 6.1 oz (79.1 kg), SpO2 97 %. No IVF or IV access now  Awake, alert, lying on right side initially, repositions onto back without assistance but with pain. Respirations 22. PERRL. Oral mucosa moist without lesions. Lungs with minimal crackles right base posteriorly (side of remote VATS for malignant effusion) otherwise clear. Heart RRR. Abdomen soft, sligntly distended, few BS, not tender    Lab Results: No results for input(s): WBC, HGB, HCT, PLT in the last 72 hours. BMET  Recent Labs  09/04/15 0026  NA 135  K 3.7  CL 104  CO2 20*  GLUCOSE 145*  BUN 14  CREATININE 0.83  CALCIUM 8.5*  UA noted, culture pending  Last CBC 2-11  Studies/Results: No results found.   Discussed starting Aromasin for progressive metastatic breast cancer following long stable disease on Femara, patient agrees.   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 On neurosurgery service, increasing activity with PT and OT. Plan DC to SNF. 2.post partial course of radiation thru 08-29-15 to T10 region. Dr Sondra Come may complete radiation following this surgery 3.hypercalcemia and metastatic bone involvement: post zometa 1- 20-17 which will be continued monthly for now. Corrected calcium still ok 4.Hormone positive right breast cancer diagnosed 1998 and recurrent  to pleura in 2010, post VATS pleurodesis and long stable course on Femara until recent progression. Femara DCd 08-10-15;  start Aromasin now. PET originally planned for 09-04-15, will need to be rescheduled  outpatient when possible as she has not had full evaluation for restaging. Has involvement seen on CT in right iliac wing which has not been symptomatic. No other complaints of pain in long bones, but again has not gotten PET (or bone scan) accomplished.  5.diarrhea Jan treated for C diff ( Ab + toxin -) and resolved. No bowel movement since admission, continue laxatives 6.refused flu vaccine 7.remote past tobacco, none since 1998 8.social situation: has been primary caregiver for 37 yo mother at home until present problems, most recently at [facility] following last hospitalization. Patient and mother hoping to move together to [name]. Advance Directives in place 10.protein calorie malnutrition: discussed need for improved nutrition to recover from surgery. Requested Ensure tid. Will ask nutritionist to see 11.low temp post op: better, urine culture pending  Please page me if I can assist between rounds  [phone] Alfredo Spong P

## 2015-09-05 NOTE — Progress Notes (Signed)
Initial Nutrition Assessment  DOCUMENTATION CODES:   Obesity unspecified  INTERVENTION:  Provide Ensure Enlive po TID, each supplement provides 350 kcal and 20 grams of protein Provide Multivitamin with minerals daily   NUTRITION DIAGNOSIS:   Inadequate oral intake related to poor appetite as evidenced by energy intake < or equal to 50% for > or equal to 5 days, mild depletion of body fat.   GOAL:   Patient will meet greater than or equal to 90% of their needs   MONITOR:   PO intake, Supplement acceptance, Labs, Weight trends, Skin  REASON FOR ASSESSMENT:   Consult Poor PO  ASSESSMENT:   75 y.o. 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 Difficile colitis, resolved, of by mouth vancomycin, patient was sent from Dr. Marko Plume office, as MRI 08/29/2015 thoracic spine showing T10 compression fracture,howing retropulsion of bone, impinging on spinal cord, and she discussed with Dr. Cyndy Freeze recommended patient to be admitted,for surgical intervention, recommended admission one day prior to surgery for surgical stabilization,surgery planned on 09/01/2015, even though imaging with evidence of early cord compression.  Pt states that since admission she has had a poor appetite and has been eating 50% of meals. She reports losing from 192 lbs to 171 lbs since September 2016. She reports eating well PTA. She denies any nausea or abdominal pain. States that she has constipation with last BM 2/9. RD emphasized the importance of adequate po intake for nutrition status, healing and recovery. Pt states that she likes Ensure and is agreeable to drinking it daily.   Labs: low calcium  Diet Order:  Diet heart healthy/carb modified Room service appropriate?: Yes; Fluid consistency:: Thin  Skin:  Wound (see comment) (closed incision on back)  Last BM:   2/9  Height:   Ht Readings from Last 1 Encounters:  09/01/15 5\' 3"  (1.6 m)    Weight:   Wt Readings from Last 1 Encounters:  09/01/15 174 lb 6.1 oz (79.1 kg)    Ideal Body Weight:  52.3 kg  BMI:  Body mass index is 30.9 kg/(m^2).  Estimated Nutritional Needs:   Kcal:  1700-1900  Protein:  75-90 grams  Fluid:  1.7-1.9 L/day  EDUCATION NEEDS:   No education needs identified at this time  [place], LDN Inpatient Clinical Dietitian Pager: [phone] After Hours Pager: [phone]

## 2015-09-05 NOTE — Progress Notes (Signed)
Physical Therapy Treatment Patient Details Name: Lisa [name] MRN: [id number] DOB: January 22, 1941 Today's Date: 09/05/2015    History of Present Illness Admitted 2/9 with pathologic T10 fracture (due to metastatic breast Ca) with cord compression; 2/10 T6-L1 fusion    PT Comments    Pt making good yet slow progress towards goals. Tolerated increased activity today while maintaining back precautions t/o session. Pt quickly fatigues needing increased assistance during gait as she becomes more unstable. Increased RR and labored breathing associated with short ambulation distance. Pt will benefit from ST-SNF to improve endurance and strength to return to baseline level of independence.   Follow Up Recommendations  SNF;Supervision/Assistance - 24 hour     Equipment Recommendations  None recommended by PT    Recommendations for Other Services       Precautions / Restrictions Precautions Precautions: Back;Fall Precaution Booklet Issued: No Precaution Comments: patient able to stated 3/3 precautions Required Braces or Orthoses: Spinal Brace Spinal Brace: Lumbar corset;Applied in sitting position Restrictions Weight Bearing Restrictions: No    Mobility  Bed Mobility Overal bed mobility: Needs Assistance Bed Mobility: Rolling;Sidelying to Sit Rolling: Modified independent (Device/Increase time) Sidelying to sit: Supervision       General bed mobility comments: with bed flat demonstrated proper log roll  Transfers Overall transfer level: Needs assistance Equipment used: Rolling walker (2 wheeled) Transfers: Sit to/from Stand Sit to Stand: Min guard         General transfer comment: VC's for sequencing however no external support needed, uses momentum to achieve standing, increased work of breathing  Ambulation/Gait Ambulation/Gait assistance: Museum/gallery curator (Feet): 60 Feet Assistive device: Rolling walker (2 wheeled) Gait Pattern/deviations:  Step-through pattern;Decreased stride length;Trunk flexed Gait velocity: decreased Gait velocity interpretation: Below normal speed for age/gender General Gait Details: pt demonstrated improved gait today, pt began getting"wobbly" in legs upon returning to room needing Min A to get to chair before quickly sitting, reminded on back precautions   Stairs            Wheelchair Mobility    Modified Rankin (Stroke Patients Only)       Balance Overall balance assessment: Needs assistance Sitting-balance support: No upper extremity supported;Feet supported Sitting balance-Leahy Scale: Fair     Standing balance support: Bilateral upper extremity supported;During functional activity Standing balance-Leahy Scale: Fair                      Cognition Arousal/Alertness: Awake/alert Behavior During Therapy: WFL for tasks assessed/performed Overall Cognitive Status: Within Functional Limits for tasks assessed                      Exercises      General Comments General comments (skin integrity, edema, etc.): pt able to don brace with Min A, Increased RR and work of breathing with increased activity      Pertinent Vitals/Pain Pain Assessment: 0-10 Pain Score: 4  Pain Location: back Pain Descriptors / Indicators: Grimacing;Guarding Pain Intervention(s): Monitored during session    Home Living                      Prior Function            PT Goals (current goals can now be found in the care plan section) Acute Rehab PT Goals Patient Stated Goal: decrease pain Progress towards PT goals: Progressing toward goals    Frequency  Min 4X/week    PT Plan Current  plan remains appropriate    Co-evaluation             End of Session Equipment Utilized During Treatment: Gait belt;Back brace Activity Tolerance: Patient tolerated treatment well Patient left: in chair;with call bell/phone within reach;with chair alarm set     Time:  [id number] PT Time Calculation (min) (ACUTE ONLY): 18 min  Charges:  $Gait Training: 8-22 mins                    G Codes:      Ara Kussmaul 09-24-2015, 4:31 PM  Ara Kussmaul, Student Physical Therapist Acute Rehab [phone]

## 2015-09-05 NOTE — Care Management Important Message (Signed)
Important Message  Patient Details  Name: Lisa [name] MRN: [id number] Date of Birth: 10-Jun-1941   Medicare Important Message Given:  Yes    Rolm Baptise, RN 09/05/2015, 2:24 PM

## 2015-09-05 NOTE — Care Management Note (Signed)
Case Management Note  Patient Details  Name: Lisa [name] MRN: [id number] Date of Birth: [birth year]  Subjective/Objective:                    Action/Plan: Plan is for patient to discharge to [facility]. No further needs per CM.   Expected Discharge Date:                  Expected Discharge Plan:     In-House Referral:     Discharge planning Services     Post Acute Care Choice:    Choice offered to:     DME Arranged:    DME Agency:     HH Arranged:    HH Agency:     Status of Service:  In process, will continue to follow  Medicare Important Message Given:  Yes Date Medicare IM Given:    Medicare IM give by:    Date Additional Medicare IM Given:    Additional Medicare Important Message give by:     If discussed at [place] of Stay Meetings, dates discussed:    Additional Comments:  Pollie Friar, RN 09/05/2015, 3:58 PM

## 2015-09-06 ENCOUNTER — Ambulatory Visit: Payer: Medicare Other

## 2015-09-06 DIAGNOSIS — M8448XD Pathological fracture, other site, subsequent encounter for fracture with routine healing: Secondary | ICD-10-CM

## 2015-09-06 DIAGNOSIS — C50911 Malignant neoplasm of unspecified site of right female breast: Secondary | ICD-10-CM

## 2015-09-06 MED ORDER — FLEET ENEMA 7-19 GM/118ML RE ENEM
1.0000 | ENEMA | Freq: Once | RECTAL | Status: AC | PRN
  Administered 2015-09-06: 1 via RECTAL
  Filled 2015-09-06: qty 1

## 2015-09-06 MED ORDER — ENSURE ENLIVE PO LIQD
237.0000 mL | Freq: Once | ORAL | Status: AC
  Administered 2015-09-06: 237 mL via ORAL
  Filled 2015-09-06: qty 237

## 2015-09-06 MED ORDER — DIAZEPAM 5 MG PO TABS: 5.0000 mg | ORAL_TABLET | Freq: Four times a day (QID) | ORAL | Status: DC | PRN

## 2015-09-06 MED ORDER — POLYETHYLENE GLYCOL 3350 17 G PO PACK: 17.0000 g | PACK | Freq: Once | ORAL | Status: DC | PRN

## 2015-09-06 MED ORDER — TRAMADOL HCL 50 MG PO TABS: 50.0000 mg | ORAL_TABLET | ORAL | Status: DC | PRN

## 2015-09-06 MED ORDER — GLYCERIN (LAXATIVE) 2.1 G RE SUPP
1.0000 | Freq: Once | RECTAL | Status: DC | PRN
  Filled 2015-09-06: qty 1

## 2015-09-06 MED ORDER — AMOXICILLIN 250 MG PO CAPS
250.0000 mg | ORAL_CAPSULE | Freq: Three times a day (TID) | ORAL | Status: DC
  Administered 2015-09-06 – 2015-09-07 (×3): 250 mg via ORAL
  Filled 2015-09-06 (×7): qty 1

## 2015-09-06 NOTE — Progress Notes (Signed)
MEDICAL ONCOLOGY September 06, 2015, 3:16 PM  Hospital day 7 Post op day 5 Thoracic six-Lumbar one posterolateral arthrodesis T10 laminectomy, partial laminectomy T9, T11 Antibiotics: none Chemotherapy: none.  Aromasin begun 07-04-16 IV bisphosphonate last 08-11-15  EMR reviewed, discussed with unit RN. Expect DC to SNF later today  Subjective: Feeling better today. Still some soreness towards left shoulder from upper back when lies supine, but able to sleep lying on right side. Denies increased SOB. Eating some, has not had any Ensure today but would like this now. No bowel movement since admission, did have one dose miralax this AM, may need Fleets enema or suppository to start bowels. Is passing flatus. Has been up to [name] only today, moving more easily and stronger per RN; PT OT have not worked with her today due to anticipated DC. No new or different pain, no pain LE.  Objective: Vital signs in last 24 hours: Blood pressure 120/48, pulse 41, temperature 98.7 F (37.1 C), temperature source Oral, resp. rate 18, height 5\' 3"  (1.6 m), weight 174 lb 6.1 oz (79.1 kg), SpO2 95 %. Awake, alert, joking, voice stronger and looks more energetic overall. PERRL. Mouth clear. Lungs without wheezes or rales. Dressings clean and intact along spine, staples in. Abdomen soft, quiet. LE no edema, cords, tenderness with SCD on.  Moves extremities and repositions more easily in bed.    Intake/Output from previous day: 02/14 0701 - 02/15 0700 In: 720 [P.O.:720] Out: -  Intake/Output this shift:    Lab Results: No results for input(s): WBC, HGB, HCT, PLT in the last 72 hours. BMET  Recent Labs  09/04/15 0026  NA 135  K 3.7  CL 104  CO2 20*  GLUCOSE 145*  BUN 14  CREATININE 0.83  CALCIUM 8.5*   Urine culture from 2-13 now showing >100,000 enterococcus, sensitivities pending.  Will begin amoxicillin and follow up  Studies/Results: No results found.   Assessment/Plan: 1.progressive  metastatic breast cancer now involving bone, with pathologic compression fracture T10 and retropulsion of bone impinging on spinal cord: Post T6 - L1 posterolateral arthrodesis with T10 laminectomy and T9/ T11 partial laminectomy by Dr Christella Noa on 09-01-15. On neurosurgery service, increasing activity with PT and OT. Plan DC to SNF today.  2.post partial course of radiation thru 08-29-15 to T10 region. Dr Sondra Come may complete radiation following this surgery and I will update him on status 3.hypercalcemia and metastatic bone involvement: post zometa 1- 20-17 which will be continued monthly for now. 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 Femara DCd 08-10-15;  Aromasin begun 09-05-15. PET originally planned for 09-04-15, will need to be rescheduled outpatient when possible as she has not had full evaluation for restaging. Has involvement seen on CT in right iliac wing which has not been symptomatic. No other complaints of pain in long bones, but again has not gotten PET (or bone scan) accomplished. I will see her back at [facility] coordinating with zometa next week. 5.Constipation now:  Will try Fleets enema now, hoping to get bowels moving before she goes to SNF. Note diarrhea Jan treated for C diff ( Ab + toxin -) and resolved. 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 Patient and mother hoping to move together to [facility]. Advance Directives in place 10.protein calorie malnutrition: discussed need for improved nutrition to recover from surgery. Requested Ensure tid. Will ask nutritionist to see 11.low temp post op: better, urine  culture from 2-13 with >100K enterococcus, sensitivities pending. With recent C diff concerns will avoid cipro, will follow up sensitivities .  Please page me if questions  [place]

## 2015-09-06 NOTE — Progress Notes (Signed)
Physical Therapy Treatment Patient Details Name: Lisa [name] MRN: [id number] DOB: [birth year] Today's Date: 09/06/2015    History of Present Illness Admitted 2/9 with pathologic T10 fracture (due to metastatic breast Ca) with cord compression; 2/10 T6-L1 fusion    PT Comments    Pt performed increased gait distance to 160 ft min guard/min assist.  Pt educated on spinal precautions and placement of TLSO for support during mobility.  Pt performed transfers with decreased assist.    Follow Up Recommendations  SNF;Supervision/Assistance - 24 hour     Equipment Recommendations  None recommended by PT    Recommendations for Other Services       Precautions / Restrictions Precautions Precautions: Back;Fall Precaution Booklet Issued: No Precaution Comments: patient able to stated 3/3 precautions Required Braces or Orthoses: Spinal Brace Spinal Brace: Lumbar corset;Applied in sitting position Restrictions Weight Bearing Restrictions: No    Mobility  Bed Mobility Overal bed mobility: Needs Assistance Bed Mobility: Rolling;Sidelying to Sit Rolling: Modified independent (Device/Increase time) Sidelying to sit: Supervision     Sit to sidelying: Supervision General bed mobility comments: Pt remains to demonstrate proper log rolling with cues for safety during supine<>sidelying/sitting  Transfers Overall transfer level: Needs assistance Equipment used: Rolling walker (2 wheeled) Transfers: Sit to/from Stand Sit to Stand: Min guard Stand pivot transfers: Min guard       General transfer comment: VCs for hand placement as pt initially reaching for RW for support.    Ambulation/Gait   Ambulation Distance (Feet): 160 Feet Assistive device: Rolling walker (2 wheeled) Gait Pattern/deviations: Step-through pattern;Decreased stride length   Gait velocity interpretation: Below normal speed for age/gender General Gait Details: Pt demonstrated increased gait distance to 160 ft  with cues to increase step length and progress to step through pattern.  Pt denies feeling "wobbly" in legs.  Pt demonstrated increased cadence.     Stairs            Wheelchair Mobility    Modified Rankin (Stroke Patients Only)       Balance Overall balance assessment: Needs assistance Sitting-balance support: No upper extremity supported Sitting balance-Leahy Scale: Fair     Standing balance support: Bilateral upper extremity supported;During functional activity Standing balance-Leahy Scale: Fair                      Cognition Arousal/Alertness: Awake/alert Behavior During Therapy: WFL for tasks assessed/performed Overall Cognitive Status: Within Functional Limits for tasks assessed                      Exercises      General Comments        Pertinent Vitals/Pain Pain Assessment: 0-10 Pain Score: 6  Pain Location: back Pain Descriptors / Indicators: Grimacing;Guarding Pain Intervention(s): Monitored during session    Home Living                      Prior Function            PT Goals (current goals can now be found in the care plan section) Acute Rehab PT Goals Patient Stated Goal: decrease pain Potential to Achieve Goals: Good Progress towards PT goals: Progressing toward goals    Frequency  Min 4X/week    PT Plan Current plan remains appropriate    Co-evaluation             End of Session Equipment Utilized During Treatment: Gait belt;Back brace Activity Tolerance: Patient  tolerated treatment well Patient left: in chair;with call bell/phone within reach;with chair alarm set     Time: [phone] PT Time Calculation (min) (ACUTE ONLY): 25 min  Charges:  $Gait Training: 8-22 mins $Therapeutic Activity: 8-22 mins                    G Codes:      Cristela Blue 26-Sep-2015, 4:17 PM  [place], PTA pager [phone]

## 2015-09-06 NOTE — Clinical Social Work Note (Addendum)
CSW spoke to [facility] who said they can not take patient tonight because they are unable to order medications.  They can take patient tomorrow morning once discharge summary has been completed by physician.  CSW updated bedside nurse and case manager.  CSW to continue to follow patient's progress throughout discharge planning.  Gold DNR form on patient's chart and needs signature by physician.  [facility] requesting discharge summary to be completed before 2pm on Thursday so they can order medications in time for patient to be discharged.  Jones Broom. [place], MSW, [place] 09/06/2015 5:41 PM

## 2015-09-06 NOTE — Discharge Summary (Signed)
Physician Discharge Summary  Patient ID: Lisa [name] MRN: [id number] DOB/AGE: 1941/05/02 75 y.o.  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 was taken to the operating room for treatment of a pathologic compression fracture of T10 with significant bony retropulsion resulting in spinal canal compromise.  She underwent a thoracic decompression and arthrodesis with pedicle screw fixation from T6-L1. Post op she has done very well. [name] is ambulating, voiding, and tolerating a regular diet. She will be discharged to a snf. Her wound is clean, dry, and without signs of infection at discharge.   Treatments: surgery: Thoracic six-Lumbar one posterolateral arthrodesis with allograft morsels  pedicle screw instrumentation T6-L1(Nuvasive) T10 laminectomy, partial laminectomy T9, T11   Discharge Exam: Blood pressure 138/57, pulse 102, temperature 98 F (36.7 C), temperature source Oral, resp. rate 18, height 5\' 3"  (1.6 m), weight 79.1 kg (174 lb 6.1 oz), SpO2 97 %. General appearance: alert, cooperative, appears stated age and mild distress Neurologic: Alert and oriented X 3, normal strength and tone. Normal symmetric reflexes. Normal coordination and gait  Disposition: 03-Skilled Nursing Facility Thoracic ten retropulsed pathological fracture    Medication List    STOP taking these medications        diclofenac 1.3 % Ptch  Commonly known as:  FLECTOR      TAKE these medications        diazepam 5 MG tablet  Commonly  known as:  VALIUM  Take 1 tablet (5 mg total) by mouth every 6 (six) hours as needed for anxiety.     EPIPEN 2-PAK 0.3 mg/0.3 mL Soaj injection  Generic drug:  EPINEPHrine  Inject 0.3 mg into the skin daily as needed (allergic reaction). Reported on 08/10/2015     ferrous sulfate 325 (65 FE) MG tablet  Take 325 mg by mouth 2 (two) times a week. Reported on 08/17/2015     loratadine 10 MG tablet  Commonly known as:  CLARITIN  Take 10 mg by mouth daily.     losartan 50 MG tablet  Commonly known as:  COZAAR  Take 50 mg by mouth daily.     multivitamin with minerals tablet  Take 1 tablet by mouth daily.     simvastatin 20 MG tablet  Commonly known as:  ZOCOR  Take 20 mg by mouth at bedtime.     traMADol 50 MG tablet  Commonly known as:  ULTRAM  Take 1-2 tablets (50-100 mg total) by mouth every 4 (four) hours as needed for moderate pain.     vitamin B-12 1000 MCG tablet  Commonly known as:  CYANOCOBALAMIN  Take 1,000 mcg by mouth 2 (two) times daily.     vitamin C 500 MG tablet  Commonly known as:  ASCORBIC ACID  Take 500 mg by mouth daily.     vitamin E 200 UNIT capsule  Take 200 Units by mouth daily.           Follow-up Information    Follow up with [facility] .   Specialty:  [place]  Contact information:   [address] [phone]      Signed: Winfield Cunas 09/06/2015, 6:53 PM

## 2015-09-07 ENCOUNTER — Telehealth: Payer: Self-pay | Admitting: Oncology

## 2015-09-07 ENCOUNTER — Other Ambulatory Visit: Payer: Self-pay

## 2015-09-07 ENCOUNTER — Telehealth: Payer: Self-pay | Admitting: *Deleted

## 2015-09-07 ENCOUNTER — Other Ambulatory Visit: Payer: Self-pay | Admitting: Oncology

## 2015-09-07 DIAGNOSIS — C50019 Malignant neoplasm of nipple and areola, unspecified female breast: Secondary | ICD-10-CM

## 2015-09-07 DIAGNOSIS — C50911 Malignant neoplasm of unspecified site of right female breast: Secondary | ICD-10-CM

## 2015-09-07 DIAGNOSIS — R300 Dysuria: Secondary | ICD-10-CM

## 2015-09-07 MED ORDER — AMOXICILLIN 250 MG PO CAPS: 250.0000 mg | ORAL_CAPSULE | Freq: Three times a day (TID) | ORAL | Status: DC

## 2015-09-07 MED ORDER — EXEMESTANE 25 MG PO TABS: 25.0000 mg | ORAL_TABLET | Freq: Every day | ORAL | Status: DC

## 2015-09-07 NOTE — Progress Notes (Signed)
Pt discharged to skilled nursing facility, [facility]. Discharge instructions on front of chart for facility. PTAR notified for pick up by social work. Wendee Copp

## 2015-09-07 NOTE — Telephone Encounter (Signed)
Lft msg for pt confirming labs/ov/Zometa, sent msg to add Zometa and sent msg to [facility] D. to override MD visit.... KJ

## 2015-09-07 NOTE — Progress Notes (Signed)
Physical Therapy Treatment Patient Details Name: ZANETA ORENSTEIN MRN: [id number] DOB: 1940/10/03 Today's Date: 2015/10/01    History of Present Illness Admitted 2/9 with pathologic T10 fracture (due to metastatic breast Ca) with cord compression; 2/10 T6-L1 fusion    PT Comments    Feeling weaker today and visibly less steady, however pushed herself to walk 160 ft.   Follow Up Recommendations  SNF;Supervision/Assistance - 24 hour     Equipment Recommendations  None recommended by PT    Recommendations for Other Services       Precautions / Restrictions Precautions Precautions: Back;Fall Precaution Booklet Issued: No Precaution Comments: patient able to stated 3/3 precautions Required Braces or Orthoses: Spinal Brace Spinal Brace: Lumbar corset;Applied in sitting position Restrictions Weight Bearing Restrictions: No    Mobility  Bed Mobility                  Transfers Overall transfer level: Needs assistance Equipment used: Rolling walker (2 wheeled) Transfers: Sit to/from Stand Sit to Stand: Min guard         General transfer comment: no cues; minguard due to legs visibly shakey  Ambulation/Gait Ambulation/Gait assistance: Min assist Ambulation Distance (Feet): 160 Feet Assistive device: Rolling walker (2 wheeled) Gait Pattern/deviations: Step-through pattern;Trunk flexed (almost steppage gait) Gait velocity: decreased Gait velocity interpretation: Below normal speed for age/gender General Gait Details: pt demonstrated improved gait today, pt began getting"wobbly" in legs upon returning to room needing Min A to get to chair before quickly sitting, reminded on back precautions   Stairs            Wheelchair Mobility    Modified Rankin (Stroke Patients Only)       Balance     Sitting balance-Leahy Scale: Fair     Standing balance support: Bilateral upper extremity supported Standing balance-Leahy Scale: Poor                       Cognition Arousal/Alertness: Awake/alert Behavior During Therapy: WFL for tasks assessed/performed Overall Cognitive Status: Within Functional Limits for tasks assessed                      Exercises      General Comments        Pertinent Vitals/Pain Pain Assessment: 0-10 Pain Score: 3  Pain Location: back Pain Descriptors / Indicators: Operative site guarding Pain Intervention(s): Limited activity within patient's tolerance;Monitored during session;Repositioned    Home Living                      Prior Function            PT Goals (current goals can now be found in the care plan section) Acute Rehab PT Goals Patient Stated Goal: fix this brace! Progress towards PT goals: Progressing toward goals    Frequency  Min 4X/week    PT Plan Current plan remains appropriate    Co-evaluation             End of Session Equipment Utilized During Treatment: Gait belt;Back brace Activity Tolerance: Patient tolerated treatment well Patient left: in chair;with call bell/phone within reach;with chair alarm set     Time: [id number] PT Time Calculation (min) (ACUTE ONLY): 13 min  Charges:  $Gait Training: 8-22 mins                    G Codes:      Tayra Dawe 10/01/15, 1:36  PM Pager [phone]

## 2015-09-07 NOTE — Clinical Social Work Placement (Signed)
   CLINICAL SOCIAL WORK PLACEMENT  NOTE  Date:  09/07/2015  Patient Details  Name: [name] MRN: [id number] Date of Birth: 09-Mar-1941  Clinical Social Work is seeking post-discharge placement for this patient at the [place] level of care (*CSW will initial, date and re-position this form in  chart as items are completed):  Yes   Patient/family provided with [place] Work Department's list of facilities offering this level of care within the geographic area requested by the patient (or if unable, by the patient's family).  Yes   Patient/family informed of their freedom to choose among providers that offer the needed level of care, that participate in Medicare, Medicaid or managed care program needed by the patient, have an available bed and are willing to accept the patient.  Yes   Patient/family informed of [facility]'s ownership interest in [facility] and [facility], as well as of the fact that they are under no obligation to receive care at these facilities.  PASRR submitted to EDS on       PASRR number received on       Existing PASRR number confirmed on 09/03/15     FL2 transmitted to all facilities in geographic area requested by pt/family on 09/03/15     FL2 transmitted to all facilities within larger geographic area on 09/03/15     Patient informed that his/her managed care company has contracts with or will negotiate with certain facilities, including the following:        Yes   Patient/family informed of bed offers received.  Patient chooses bed at  [facility])     Physician recommends and patient chooses bed at      Patient to be transferred to  [facility]) on 09/07/15.  Patient to be transferred to facility by  [facility])     Patient family notified on 09/07/15 of transfer.  Name of family member notified:  Patient is aware and will inform family.     PHYSICIAN Please sign DNR      Additional Comment:    _______________________________________________ Leane Call, Student-SW 09/07/2015, 9:20 AM

## 2015-09-07 NOTE — Progress Notes (Signed)
Call to Dr. Lacy Duverney office to notify of AVS reconciliation needed. Message left at Dr. Mariana Kaufman office for duration of amoxicillin so Dr. Christella Noa will reconcile. DNR also needs to be signed. PTAR on hold. Wendee Copp

## 2015-09-07 NOTE — Clinical Social Work Note (Signed)
Patient to d/c to [facility] via [place] intern has informed patient and patient family of patient d/c.   BSW intern is signing off. If any further Social Work needs arises, please re-consult.  Nayelie Gionfriddo-BSW Intern [phone]

## 2015-09-07 NOTE — Telephone Encounter (Signed)
Per staff message and POF I have scheduled appts. Advised scheduler of appts. JMW  

## 2015-09-07 NOTE — Telephone Encounter (Signed)
Scripts for amoxicillin and exemestane faxed to [place].

## 2015-09-08 ENCOUNTER — Telehealth: Payer: Self-pay

## 2015-09-08 NOTE — Telephone Encounter (Signed)
Appointment for visit with Dr. Marko Plume and Zometa are set up for 09-15-15 as ordered.

## 2015-09-08 NOTE — Telephone Encounter (Addendum)
Reached Ms. Bilbro' nurse  Raquel and confirmed receipt of re faxed prescriptions for amoxicillin and Aromason today.   Raquel stated that she faxed the prescriptions to their pharmacy and Lisa [name] would receive at least 1 dose of the amoxicillin today and begin the [facility] tomorrow.  Spoke with Micro lab at [name].  The urine culture from 09-04-15  had to be sent out to finish processing sensitivities.  The sensitivities should be available Sunday 09-10-15 or Monday 09-11-15.

## 2015-09-10 ENCOUNTER — Other Ambulatory Visit: Payer: Self-pay | Admitting: Oncology

## 2015-09-11 NOTE — Telephone Encounter (Signed)
Gave Dr. Marko Plume the report of the  urine culture sensitivities as noted in epic for 09-04-15 specimen to review.

## 2015-09-11 NOTE — Progress Notes (Signed)
°  Radiation Oncology         [phone]) [phone] ________________________________  Name: Lisa [name] MRN: [id number]  Date: 08/29/2015  DOB: [birth year]  End of Treatment Note  ICD-9-CM ICD-10-CM     1. Breast cancer metastasized to bone, unspecified laterality (HCC) 174.9 C50.919    198.5 C79.51     DIAGNOSIS: Metastatic breast cancer with osseous metastasis     Indication for treatment:  Back pain and pathologic fracture T10       Radiation treatment dates:   08/17/2015-08/29/2015  Site/dose:   Lower thoracic spine 22.5 gray in 9 fractions  Beams/energy:   3-D conformal, AP PA with 6 x and 15 x beams  Narrative: The patient tolerated radiation treatment relatively well.   The patient completed an abbreviated course of radiation therapy. She is originally scheduled to received 14 treatments however after the patient's ninth treatment she underwent an MRI to further assess her disease. This showed retropulsion of the vertebral body resulting in spinal cord compression. In light of this the patient was taken to the operating room by neurosurgery for decompression and spine stabilization.  Plan: The patient has completed radiation treatment. The patient will return to radiation oncology clinic for routine followup in one month. I advised them to call or return sooner if they have any questions or concerns related to their recovery or treatment.  The patient may return at a later date to complete her original planned course of radiation therapy depending on her overall status.  -----------------------------------  Blair Promise, PhD, MD

## 2015-09-12 ENCOUNTER — Encounter: Payer: Self-pay | Admitting: Internal Medicine

## 2015-09-12 ENCOUNTER — Non-Acute Institutional Stay (SKILLED_NURSING_FACILITY): Payer: Medicare Other | Admitting: Internal Medicine

## 2015-09-12 DIAGNOSIS — M4854XS Collapsed vertebra, not elsewhere classified, thoracic region, sequela of fracture: Secondary | ICD-10-CM | POA: Diagnosis not present

## 2015-09-12 DIAGNOSIS — N183 Chronic kidney disease, stage 3 unspecified: Secondary | ICD-10-CM

## 2015-09-12 DIAGNOSIS — D62 Acute posthemorrhagic anemia: Secondary | ICD-10-CM

## 2015-09-12 DIAGNOSIS — E785 Hyperlipidemia, unspecified: Secondary | ICD-10-CM

## 2015-09-12 DIAGNOSIS — C7951 Secondary malignant neoplasm of bone: Secondary | ICD-10-CM | POA: Diagnosis not present

## 2015-09-12 DIAGNOSIS — J302 Other seasonal allergic rhinitis: Secondary | ICD-10-CM | POA: Diagnosis not present

## 2015-09-12 DIAGNOSIS — C50911 Malignant neoplasm of unspecified site of right female breast: Secondary | ICD-10-CM | POA: Diagnosis not present

## 2015-09-12 DIAGNOSIS — I10 Essential (primary) hypertension: Secondary | ICD-10-CM | POA: Diagnosis not present

## 2015-09-12 LAB — BASIC METABOLIC PANEL
BUN: 12 mg/dL (ref 4–21)
CREATININE: 0.8 mg/dL (ref 0.5–1.1)
GLUCOSE: 114 mg/dL
POTASSIUM: 3.3 mmol/L — AB (ref 3.4–5.3)
Sodium: 139 mmol/L (ref 137–147)

## 2015-09-12 LAB — CBC AND DIFFERENTIAL
HCT: 38 % (ref 36–46)
Hemoglobin: 12.1 g/dL (ref 12.0–16.0)
PLATELETS: 297 10*3/uL (ref 150–399)
WBC: 4.7 10*3/mL

## 2015-09-12 NOTE — Progress Notes (Addendum)
Patient ID: Lisa [name], female   DOB: 1941/02/03, 75 y.o.   MRN: [id number] MRN: [id number] Name: Lisa [name]  Sex: female Age: 75 y.o. DOB: 02/17/1941  [place] #: [place] Facility/Room:124B Level Of Care: SNF Provider:Cumi Sanagustin, Noah Delaine, MD  Emergency Contacts: Extended Emergency Contact Information Primary Emergency Contact: Rosebud Poles Address: [address], [place] Phone: [phone] Relation: Mother Secondary Emergency Contact: Sakellaris,Chris  [place] Mobile Phone: [phone] Relation: Uncle  Code Status:   Allergies: Diphenhydramine hcl; Codeine sulfate; Oseltamivir phosphate; Oxycodone-acetaminophen; Prednisone; and Sudafed  Chief Complaint  Patient presents with  . New Admit To SNF    HPI: Patient is 75 y.o. female with metastatic breast CA who was admitted to [facility] from 2/9-15 for treatment of a pathologic compression fracture of T10 with significant bony retropulsion resulting in spinal canal compromise. She underwent a thoracic decompression and arthrodesis with pedicle screw fixation from T6-L1 with good post-op results. Pt is admitted to SNF for generalized weakness and for OT/PT s/p surgery. While at SNF pt will be followed for Hr, HLD , tx with zocor and anemia , tx with anemia.  Past Medical History  Diagnosis Date  . Pleural effusion 11-08-2008  . Chronic back pain   . Hypertension   . Complication of anesthesia   . PONV (postoperative nausea and vomiting)   . Breast cancer ([place]) 07-13-1997    right    Past Surgical History  Procedure Laterality Date  . Abdominal hysterectomy  02-21-1982  . Pelvic laparoscopy  11-06-1981  . Dilation and curettage, diagnostic / therapeutic  12-13-1981  . Breast biopsy  07-13-1997  . Mastectomy  07-29-1997  . Thoracentesis  10-18-2008  . Posterior lumbar fusion 4 level N/A 09/01/2015    Procedure: Thoracic six-Lumbar one fusion with arthrodesis  pedicle screw stabilization and decompression;  Surgeon: Ashok Pall, MD;  Location: [facility];  Service: Neurosurgery;  Laterality: N/A;  T6-L1 fusion with arthrodesis pedicle screw stabilization and decompression      Medication List       This list is accurate as of: 09/12/15 11:59 PM.  Always use your most recent med list.               diazepam 5 MG tablet  Commonly known as:  VALIUM  Take 1 tablet (5 mg total) by mouth every 6 (six) hours as needed for anxiety.     EPIPEN 2-PAK 0.3 mg/0.3 mL Soaj injection  Generic drug:  EPINEPHrine  Inject 0.3 mg into the skin daily as needed (allergic reaction). Reported on 08/10/2015     exemestane 25 MG tablet  Commonly known as:  AROMASIN  Take 1 tablet (25 mg total) by mouth daily after breakfast.     ferrous sulfate 325 (65 FE) MG tablet  Take 325 mg by mouth 2 (two) times a week. Reported on 08/17/2015     loratadine 10 MG tablet  Commonly known as:  CLARITIN  Take 10 mg by mouth daily.     losartan 50 MG tablet  Commonly known as:  COZAAR  Take 50 mg by mouth daily.     multivitamin with minerals tablet  Take 1 tablet by mouth daily.     simvastatin 20 MG tablet  Commonly known as:  ZOCOR  Take 20 mg by mouth at bedtime.     traMADol 50 MG tablet  Commonly known as:  Veatrice Bourbon  Take 1-2 tablets (50-100 mg total) by mouth every 4 (four) hours as needed for moderate pain.     vitamin B-12 1000 MCG tablet  Commonly known as:  CYANOCOBALAMIN  Take 1,000 mcg by mouth 2 (two) times daily.     vitamin C 500 MG tablet  Commonly known as:  ASCORBIC ACID  Take 500 mg by mouth daily.     vitamin E 200 UNIT capsule  Take 200 Units by mouth daily.        No orders of the defined types were placed in this encounter.    Immunization History  Administered Date(s) Administered  . Pneumococcal-Unspecified 04/20/2014  . Tdap 02/03/2008    Social History  Substance Use Topics  . Smoking status: Former Smoker -- 0.50  packs/day for 30 years    Types: Cigarettes    Quit date: 07/22/1996  . Smokeless tobacco: Never Used  . Alcohol Use: No   FH - + HD, asthma  Review of Systems  DATA OBTAINED: from patient, nurse GENERAL:  no fevers, fatigue, appetite changes SKIN: No itching, rash HEENT: No complaint RESPIRATORY: No cough, wheezing, SOB CARDIAC: No chest pain, palpitations, lower extremity edema  GI: No abdominal pain, No N/V/D or constipation, No heartburn or reflux  GU: No dysuria, frequency or urgency, or incontinence  MUSCULOSKELETAL: No unrelieved bone/joint pain NEUROLOGIC: No headache, dizziness  PSYCHIATRIC: No overt anxiety or sadness  Filed Vitals:   09/12/15 1523  BP: 133/80  Pulse: 81  Temp: 96.8 F (36 C)  Resp: 20    Physical Exam  GENERAL APPEARANCE: Alert, conversant, No acute distress  SKIN: No diaphoresis rash, or wounds HEENT: Unremarkable RESPIRATORY: Breathing is even, unlabored. Lung sounds are clear   CARDIOVASCULAR: Heart RRR with 1/6 systolic murmur no  rubs or gallops. No peripheral edema  GASTROINTESTINAL: Abdomen is soft, non-tender, not distended w/ normal bowel sounds.  GENITOURINARY: Bladder non tender, not distended  MUSCULOSKELETAL: No abnormal joints or musculature NEUROLOGIC: Cranial nerves 2-12 grossly intact. Moves all extremities PSYCHIATRIC: Mood and affect appropriate to situation, no behavioral issues  Patient Active Problem List   Diagnosis Date Noted  . Postoperative anemia due to acute blood loss 09/14/2015  . Metastatic breast cancer ([place]) 09/01/2015  . Cord compression ([place]) 08/31/2015  . Pathologic compression fracture of thoracic vertebra ([place]) 08/31/2015  . Pathologic fracture of thoracic vertebrae 08/26/2015  . Dehydration 08/12/2015  . C. difficile colitis 08/12/2015  . CKD (chronic kidney disease) stage 3, GFR 30-59 ml/min 08/12/2015  . Unintentional weight loss 08/12/2015  . Cancer associated pain 08/12/2015  . Hypokalemia  08/08/2015  . Breast cancer metastasized to bone ([place]) 08/07/2015  . Enteritis due to Clostridium difficile 08/06/2015  . Sepsis ([place]) 08/02/2015  . Diarrhea 08/02/2015  . AKI (acute kidney injury) ([place]) 08/02/2015  . Hypercalcemia 08/02/2015  . Acute kidney injury ([place])   . Bone metastases ([place])   . 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 PLEURAL EFFUSION 10/13/2008  . ACUTE BRONCHOSPASM 06/27/2008  . Disorder of bone and cartilage 02/03/2008  . UNS ADVRS EFF UNS RX MEDICINAL&BIOLOGICAL SBSTNC 01/04/2008  . HLD (hyperlipidemia) 07/24/2007  . Essential hypertension 07/24/2007  . Allergic rhinitis 07/24/2007  . BREAST CANCER, HX OF 07/24/2007    CBC    Component Value Date/Time   WBC 7.2 09/02/2015 0910   WBC 3.0* 08/31/2015 0825   RBC 3.94 09/02/2015 0910   RBC 4.40 08/31/2015 0825   HGB 11.3* 09/02/2015 0910  HGB 12.7 08/31/2015 0825   HCT 33.6* 09/02/2015 0910   HCT 37.8 08/31/2015 0825   PLT 187 09/02/2015 0910   PLT 217 08/31/2015 0825   MCV 85.3 09/02/2015 0910   MCV 85.9 08/31/2015 0825   LYMPHSABS 0.1* 08/31/2015 0825   LYMPHSABS 1.6 08/02/2015 1743   MONOABS 0.5 08/31/2015 0825   MONOABS 2.4* 08/02/2015 1743   EOSABS 0.2 08/31/2015 0825   EOSABS 0.0 08/02/2015 1743   BASOSABS 0.0 08/31/2015 0825   BASOSABS 0.0 08/02/2015 1743    CMP     Component Value Date/Time   NA 135 09/04/2015 0026   NA 137 08/31/2015 0826   NA 144 12/30/2011 0800   K 3.7 09/04/2015 0026   K 3.8 08/31/2015 0826   K 4.5 12/30/2011 0800   CL 104 09/04/2015 0026   CL 108* 01/13/2013 0806   CL 106 12/30/2011 0800   CO2 20* 09/04/2015 0026   CO2 18* 08/31/2015 0826   CO2 28 12/30/2011 0800   GLUCOSE 145* 09/04/2015 0026   GLUCOSE 116 08/31/2015 0826   GLUCOSE 101* 01/13/2013 0806   GLUCOSE 108 12/30/2011 0800   BUN 14 09/04/2015 0026   BUN 14.2 08/31/2015 0826   BUN 22 12/30/2011 0800   CREATININE 0.83 09/04/2015 0026   CREATININE 0.9 08/31/2015 0826    CREATININE 0.5* 12/30/2011 0800   CALCIUM 8.5* 09/04/2015 0026   CALCIUM 9.4 08/31/2015 0826   CALCIUM 9.5 12/30/2011 0800   CALCIUM 10.0 12/07/2008 1203   PROT 5.8* 09/04/2015 0026   PROT 7.1 08/31/2015 0826   PROT 7.5 12/30/2011 0800   ALBUMIN 2.8* 09/04/2015 0026   ALBUMIN 3.5 08/31/2015 0826   ALBUMIN 3.8 12/30/2011 0800   AST 26 09/04/2015 0026   AST 28 08/31/2015 0826   AST 25 12/30/2011 0800   ALT 20 09/04/2015 0026   ALT 22 08/31/2015 0826   ALT 28 12/30/2011 0800   ALKPHOS 84 09/04/2015 0026   ALKPHOS 122 08/31/2015 0826   ALKPHOS 57 12/30/2011 0800   BILITOT 0.8 09/04/2015 0026   BILITOT 0.78 08/31/2015 0826   BILITOT 1.00 12/30/2011 0800   GFRNONAA >60 09/04/2015 0026   GFRAA >60 09/04/2015 0026   Not all tests, labs and studies come through on EPIC but all ar reviewed by me. Assessment and Plan  Pathologic compression fracture of thoracic vertebra (HCC) taken to the operating room for treatment of a pathologic compression fracture of T10 with significant bony retropulsion resulting in spinal canal compromise.  She underwent a thoracic decompression and arthrodesis with pedicle screw fixation from T6-L1. Post op she has done very well  SNF - admitted for OT/PT  Breast cancer metastasized to bone [facility]) With pathologic compression fx, repaired in surgery for quality of life.  Essential hypertension SNF - controlled ;cont cozaar 50 mg daily  Allergic rhinitis SNF - controlled ; cont claritin  CKD (chronic kidney disease) stage 3, GFR 30-59 ml/min SNF - 15/0.83; baseline , will follow with BMP  HLD (hyperlipidemia) SNF - controlled on zocor 20 mg ;LDL 57, HDL 55; cont zocor  Postoperative anemia due to acute blood loss SNF - post op Hb 11.3; will follow with CBC; cont twice weekly iron   Time spent > 45 min;> 50% of time with patient was spent reviewing records, labs, tests and studies, counseling and developing plan of care  Hennie Duos, MD

## 2015-09-13 ENCOUNTER — Encounter (HOSPITAL_COMMUNITY): Payer: Self-pay | Admitting: Internal Medicine

## 2015-09-13 LAB — URINE CULTURE

## 2015-09-14 ENCOUNTER — Encounter: Payer: Self-pay | Admitting: Internal Medicine

## 2015-09-14 DIAGNOSIS — D62 Acute posthemorrhagic anemia: Secondary | ICD-10-CM | POA: Insufficient documentation

## 2015-09-14 NOTE — Assessment & Plan Note (Addendum)
With pathologic compression fx, repaired in surgery for quality of life.

## 2015-09-14 NOTE — Assessment & Plan Note (Signed)
SNF - 15/0.83; baseline , will follow with BMP

## 2015-09-14 NOTE — Assessment & Plan Note (Signed)
SNF - controlled ;cont cozaar 50 mg daily

## 2015-09-14 NOTE — Assessment & Plan Note (Signed)
taken to the operating room for treatment of a pathologic compression fracture of T10 with significant bony retropulsion resulting in spinal canal compromise.  She underwent a thoracic decompression and arthrodesis with pedicle screw fixation from T6-L1. Post op she has done very well  SNF - admitted for OT/PT

## 2015-09-14 NOTE — Assessment & Plan Note (Signed)
SNF - controlled on zocor 20 mg ;LDL 57, HDL 55; cont zocor

## 2015-09-14 NOTE — Assessment & Plan Note (Addendum)
SNF - post op Hb 11.3; will follow with CBC; cont twice weekly iron

## 2015-09-14 NOTE — Assessment & Plan Note (Signed)
SNF - controlled ; cont claritin

## 2015-09-15 ENCOUNTER — Ambulatory Visit (HOSPITAL_BASED_OUTPATIENT_CLINIC_OR_DEPARTMENT_OTHER): Payer: Medicare Other

## 2015-09-15 ENCOUNTER — Other Ambulatory Visit (HOSPITAL_BASED_OUTPATIENT_CLINIC_OR_DEPARTMENT_OTHER): Payer: Medicare Other

## 2015-09-15 ENCOUNTER — Telehealth: Payer: Self-pay | Admitting: Oncology

## 2015-09-15 ENCOUNTER — Ambulatory Visit (HOSPITAL_BASED_OUTPATIENT_CLINIC_OR_DEPARTMENT_OTHER): Payer: Medicare Other | Admitting: Oncology

## 2015-09-15 ENCOUNTER — Encounter: Payer: Self-pay | Admitting: Oncology

## 2015-09-15 VITALS — BP 141/82 | HR 105 | Temp 98.2°F | Resp 19 | Ht 63.0 in | Wt 171.1 lb

## 2015-09-15 DIAGNOSIS — C7951 Secondary malignant neoplasm of bone: Secondary | ICD-10-CM

## 2015-09-15 DIAGNOSIS — C50911 Malignant neoplasm of unspecified site of right female breast: Secondary | ICD-10-CM

## 2015-09-15 DIAGNOSIS — R634 Abnormal weight loss: Secondary | ICD-10-CM

## 2015-09-15 DIAGNOSIS — C50919 Malignant neoplasm of unspecified site of unspecified female breast: Secondary | ICD-10-CM

## 2015-09-15 DIAGNOSIS — M4854XS Collapsed vertebra, not elsewhere classified, thoracic region, sequela of fracture: Secondary | ICD-10-CM

## 2015-09-15 DIAGNOSIS — N390 Urinary tract infection, site not specified: Secondary | ICD-10-CM

## 2015-09-15 DIAGNOSIS — M8448XD Pathological fracture, other site, subsequent encounter for fracture with routine healing: Secondary | ICD-10-CM | POA: Diagnosis not present

## 2015-09-15 DIAGNOSIS — B952 Enterococcus as the cause of diseases classified elsewhere: Secondary | ICD-10-CM

## 2015-09-15 DIAGNOSIS — C782 Secondary malignant neoplasm of pleura: Secondary | ICD-10-CM

## 2015-09-15 LAB — CBC WITH DIFFERENTIAL/PLATELET
BASO%: 1.2 % (ref 0.0–2.0)
Basophils Absolute: 0.1 10*3/uL (ref 0.0–0.1)
EOS ABS: 0.3 10*3/uL (ref 0.0–0.5)
EOS%: 5.9 % (ref 0.0–7.0)
HCT: 34.1 % — ABNORMAL LOW (ref 34.8–46.6)
HGB: 11.6 g/dL (ref 11.6–15.9)
LYMPH%: 14.9 % (ref 14.0–49.7)
MCH: 28.7 pg (ref 25.1–34.0)
MCHC: 34.2 g/dL (ref 31.5–36.0)
MCV: 84 fL (ref 79.5–101.0)
MONO#: 0.7 10*3/uL (ref 0.1–0.9)
MONO%: 12.7 % (ref 0.0–14.0)
NEUT%: 65.3 % (ref 38.4–76.8)
NEUTROS ABS: 3.5 10*3/uL (ref 1.5–6.5)
Platelets: 311 10*3/uL (ref 145–400)
RBC: 4.06 10*6/uL (ref 3.70–5.45)
RDW: 14.3 % (ref 11.2–14.5)
WBC: 5.4 10*3/uL (ref 3.9–10.3)
lymph#: 0.8 10*3/uL — ABNORMAL LOW (ref 0.9–3.3)

## 2015-09-15 LAB — COMPREHENSIVE METABOLIC PANEL
ALT: 37 U/L (ref 0–55)
AST: 38 U/L — ABNORMAL HIGH (ref 5–34)
Albumin: 3.1 g/dL — ABNORMAL LOW (ref 3.5–5.0)
Alkaline Phosphatase: 128 U/L (ref 40–150)
Anion Gap: 10 mEq/L (ref 3–11)
BILIRUBIN TOTAL: 0.8 mg/dL (ref 0.20–1.20)
BUN: 15.3 mg/dL (ref 7.0–26.0)
CHLORIDE: 110 meq/L — AB (ref 98–109)
CO2: 18 meq/L — AB (ref 22–29)
Calcium: 8.9 mg/dL (ref 8.4–10.4)
Creatinine: 0.8 mg/dL (ref 0.6–1.1)
EGFR: 69 mL/min/{1.73_m2} — AB (ref >90)
GLUCOSE: 118 mg/dL (ref 70–140)
POTASSIUM: 3.4 meq/L — AB (ref 3.5–5.1)
SODIUM: 138 meq/L (ref 136–145)
TOTAL PROTEIN: 6.5 g/dL (ref 6.4–8.3)

## 2015-09-15 MED ORDER — SODIUM CHLORIDE 0.9 % IV SOLN
Freq: Once | INTRAVENOUS | Status: AC
  Administered 2015-09-15: 10:00:00 via INTRAVENOUS

## 2015-09-15 MED ORDER — ZOLEDRONIC ACID 4 MG/100ML IV SOLN
4.0000 mg | Freq: Once | INTRAVENOUS | Status: AC
  Administered 2015-09-15: 4 mg via INTRAVENOUS
  Filled 2015-09-15: qty 100

## 2015-09-15 MED ORDER — TRAMADOL HCL 50 MG PO TABS: 50.0000 mg | ORAL_TABLET | ORAL | Status: DC | PRN

## 2015-09-15 MED FILL — traMADol HCL 50 MG TABS: 50 | 2 days supply | Qty: 15 | Fill #0

## 2015-09-15 NOTE — Progress Notes (Signed)
Pt unable to provide urine sample. Pt took specimen cup with her and will bring it to the lab when she can.

## 2015-09-15 NOTE — Patient Instructions (Signed)

## 2015-09-15 NOTE — Telephone Encounter (Signed)
Gave patient avs report and appointments for March, including pet scan 3/2 and Dr. Sondra Come 3/30.

## 2015-09-15 NOTE — Progress Notes (Signed)
OFFICE PROGRESS NOTE   September 15, 2015   Physicians: J.Kinard, [name], R.Donneta Romberg, A.Kendall. K.Cabbell  INTERVAL HISTORY:  Patient is seen, together with aunt, in continuing attention to metastatic breast cancer which has involved pleura since 2010 and has recently progressed to involve bone. She had  thoracic six- lumbar one posterolateral arthrodesis, T10 laminectomy, partial laminectomy T9, T11 by Dr Christella Noa on 09-01-15. She began Aromasin on 09-07-15. She will have second zometa today.  Patient has been gradually improving since surgery 2 weeks ago, back at [facility] and continuing careful PT. She is walking short distances with walker, still fatigues quickly. She had staples removed at Dr Lacy Duverney office on 09-14-15, that office to call her to schedule next follow up. She is in agreement with follow up with Dr Sondra Come, to consider completing radiation.  Appetite seems some better, tho still limited po's and I have asked her to drink 1-2 Ensure daily. She is having formed bowel movements daily. She is tolerating the Aromasin without difficulty. She believes that she completed amoxicillin 2 days ago, that for VRE in urine; will repeat urine culture now to be sure cleared as she continues to have low grade temperatures ~ 99.2 at hs. She feels SOB when back pain is increased. She is using tramadol only when pain is severe, discussed. She denies cough, bleeding, nausea, new or different pain.   No PAC Declined flu vaccine  ONCOLOGIC HISTORY The right breast carcinoma initially was T2 N1, ER/PR-positive in December 1998, treated with mastectomy with 12 node axillary evaluation and adjuvant Adriamycin/Cytoxan followed by 5 years of tamoxifen through December 2003. 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 CA 2729 was 434 in April 2010. She has tolerated Femara well and  clinically has continued to do very well, though CA 27.29 has never normalized. 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 Patient seen by medical oncology 08-10-15 and radiation begun shortly afterwards by Dr Sondra Come, 22.5 of planned 35 Gy radiation to area of T10 thru 08-29-15. MRI T spine obtained after some difficulty on 08-29-15, with pathologic compression fracture T10 with retropulsion of compressed vertebra to right with compression of cord called, involvement also T9, T11, L2. She had thoracic six- lumbar one posterolateral arthrodesis, T10 laminectomy, partial laminectomy T9, T11 by Dr Christella Noa on 09-01-15. She was begun on Aromasin on 09-07-15.  PET was scheduled for 09-04-15, delayed with surgery.   Objective:  Vital signs in last 24 hours:  BP 141/82 mmHg  Pulse 105  Temp(Src) 98.2 F (36.8 C) (Oral)  Resp 19  Ht 5' 3"  (1.6 m)  Wt 171 lb 1.6 oz (77.61 kg)  BMI 30.32 kg/m2  SpO2 98% Weight down 2 lbs from prior to surgery. Wearing brace, tho removes this for exam. Alert, oriented and appropriate. Ambulatory across exam room, more easily able to get up from sitting position, looks more comfortable seated in WC today.  No alopecia  HEENT:PERRL, sclerae not icteric. Oral mucosa moist without lesions, posterior pharynx clear.  Neck supple. No JVD.  Lymphatics:no supraclavicular adenopathy Resp: BS clear on left and right upper field, present but diminished right lower posteriorly. No wheezes or rales.  Cardio: regular rate and rhythm. No gallop. GI: soft, nontender, not distended, no mass or organomegaly. Normally active bowel sounds.  Musculoskeletal/ Extremities: without pitting edema,  cords, tenderness. Decreased muscle mass in extremities Incision appears to be healing well, no open areas, some minimal erythema without particular tenderness and no drainage. Tape skin irritation out laterally on either side.  Neuro:  no peripheral neuropathy. Otherwise nonfocal. PSYCH appropriate mood and affect Skin without rash, ecchymosis, petechiae   Lab Results:  Results for orders placed or performed in visit on 09/15/15  CBC with Differential  Result Value Ref Range   WBC 5.4 3.9 - 10.3 10e3/uL   NEUT# 3.5 1.5 - 6.5 10e3/uL   HGB 11.6 11.6 - 15.9 g/dL   HCT 34.1 (L) 34.8 - 46.6 %   Platelets 311 145 - 400 10e3/uL   MCV 84.0 79.5 - 101.0 fL   MCH 28.7 25.1 - 34.0 pg   MCHC 34.2 31.5 - 36.0 g/dL   RBC 4.06 3.70 - 5.45 10e6/uL   RDW 14.3 11.2 - 14.5 %   lymph# 0.8 (L) 0.9 - 3.3 10e3/uL   MONO# 0.7 0.1 - 0.9 10e3/uL   Eosinophils Absolute 0.3 0.0 - 0.5 10e3/uL   Basophils Absolute 0.1 0.0 - 0.1 10e3/uL   NEUT% 65.3 38.4 - 76.8 %   LYMPH% 14.9 14.0 - 49.7 %   MONO% 12.7 0.0 - 14.0 %   EOS% 5.9 0.0 - 7.0 %   BASO% 1.2 0.0 - 2.0 %  Comprehensive metabolic panel  Result Value Ref Range   Sodium 138 136 - 145 mEq/L   Potassium 3.4 (L) 3.5 - 5.1 mEq/L   Chloride 110 (H) 98 - 109 mEq/L   CO2 18 (L) 22 - 29 mEq/L   Glucose 118 70 - 140 mg/dl   BUN 15.3 7.0 - 26.0 mg/dL   Creatinine 0.8 0.6 - 1.1 mg/dL   Total Bilirubin 0.80 0.20 - 1.20 mg/dL   Alkaline Phosphatase 128 40 - 150 U/L   AST 38 (H) 5 - 34 U/L   ALT 37 0 - 55 U/L   Total Protein 6.5 6.4 - 8.3 g/dL   Albumin 3.1 (L) 3.5 - 5.0 g/dL   Calcium 8.9 8.4 - 10.4 mg/dL   Anion Gap 10 3 - 11 mEq/L   EGFR 69 (L) >90 ml/min/1.73 m2    Repeat urine pending in follow up of enterococcus UTI, off amoxicillin x 2 days   Studies/Results:  No results found.  Medications: I have reviewed the patient's current medications. As she is unable to get tramadol prior to early AM appointments due to staffing at SNF, I have given a separate prescription for this today, which they will pay for out of pocket, so that patient can take own medication if needed prior to scans, physician apts etc if not able to get thru SNF at time required. Zometa will be every 4 weeks  at least until very stable, then may extend interval  DISCUSSION Meds as above. She "hopes not to have more radiation" but would agree to this if recommended She reluctantly agrees to PET.  I have told patient that the bone involvement obviously can be very symptomatic, but may improve significantly with interventions underway. She does not appear to have other involvement in critical organs, had very long response to Femara, and I do not think she is "wasting her time" with this treatment now.    Assessment/Plan: 1.progressive metastatic breast cancer now involving bone, with pathologic compression fracture T10 and retropulsion of bone impinging on spinal cord: Post T6 - L1 posterolateral arthrodesis with T10 laminectomy and T9/ T11 partial laminectomy by  Dr Christella Noa on 09-01-15. 2.post partial course of radiation thru 08-29-15 to T10 region. Return appointment to Dr Sondra Come requested, for possible completion of radiation course. Tramadol 50 mg q 8 hr prn; should premedicate scans, radiation, transportation to MD  3.hypercalcemia and metastatic bone involvement: post zometa 1- 20-17 which will be continued monthly for now. 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 Femara DCd 08-10-15;  Aromasin begun ~ 09-06-15.[name] PET originally planned for 09-04-15,  needs to be rescheduled. Has involvement seen on CT in right iliac wing which has not been symptomatic. No other complaints of pain in long bones, but again has not gotten PET (or bone scan) accomplished.  5.diarrhea Jan treated for C diff ( Ab + toxin -) and resolved. 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  Patient and mother hoping to move together to [facility]. Advance Directives in place 10.protein calorie malnutrition: discussed need for improved nutrition to recover from surgery. 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 Recheck urine now.   All questions answered. Zometa orders confirmed. She knows to call if concerns prior to next scheduled visit, which will be with next zometa unless needed prior. Time spent 30 min including >50% counseling and coordination of care. CC routing to PCP, also to Drs Sondra Come and Dereck Ligas, MD   09/15/2015, 9:34 AM

## 2015-09-18 ENCOUNTER — Ambulatory Visit: Payer: Medicare Other | Admitting: Oncology

## 2015-09-20 ENCOUNTER — Ambulatory Visit (HOSPITAL_BASED_OUTPATIENT_CLINIC_OR_DEPARTMENT_OTHER): Payer: Medicare Other

## 2015-09-20 ENCOUNTER — Ambulatory Visit (HOSPITAL_COMMUNITY)
Admission: RE | Admit: 2015-09-20 | Discharge: 2015-09-20 | Disposition: A | Discharge status: Home / Self Care | Payer: Medicare Other | Source: Ambulatory Visit | Attending: Oncology | Admitting: Oncology

## 2015-09-20 ENCOUNTER — Other Ambulatory Visit: Payer: Self-pay

## 2015-09-20 DIAGNOSIS — C782 Secondary malignant neoplasm of pleura: Secondary | ICD-10-CM | POA: Insufficient documentation

## 2015-09-20 DIAGNOSIS — B952 Enterococcus as the cause of diseases classified elsewhere: Secondary | ICD-10-CM

## 2015-09-20 DIAGNOSIS — C50919 Malignant neoplasm of unspecified site of unspecified female breast: Secondary | ICD-10-CM

## 2015-09-20 DIAGNOSIS — C7951 Secondary malignant neoplasm of bone: Secondary | ICD-10-CM | POA: Diagnosis present

## 2015-09-20 DIAGNOSIS — I700 Atherosclerosis of aorta: Secondary | ICD-10-CM | POA: Insufficient documentation

## 2015-09-20 LAB — URINALYSIS, MICROSCOPIC - CHCC
Bilirubin (Urine): NEGATIVE
Blood: NEGATIVE
GLUCOSE UR CHCC: NEGATIVE mg/dL
Ketones: NEGATIVE mg/dL
NITRITE: NEGATIVE
PH: 6 (ref 4.6–8.0)
Specific Gravity, Urine: 1.015 (ref 1.003–1.035)
UROBILINOGEN UR: 0.2 mg/dL (ref 0.2–1)

## 2015-09-20 LAB — GLUCOSE, CAPILLARY: GLUCOSE-CAPILLARY: 99 mg/dL (ref 65–99)

## 2015-09-20 MED ORDER — FLUDEOXYGLUCOSE F - 18 (FDG) INJECTION
8.5000 | Freq: Once | INTRAVENOUS | Status: AC | PRN
  Administered 2015-09-20: 8.5 via INTRAVENOUS

## 2015-09-21 LAB — URINE CULTURE

## 2015-09-22 ENCOUNTER — Encounter: Payer: Self-pay | Admitting: *Deleted

## 2015-09-22 NOTE — Progress Notes (Signed)
[facility] Psychosocial Distress Screening Clinical Social Work  Clinical Social Work was referred by distress screening protocol.  The patient scored a 6 on the Psychosocial Distress Thermometer which indicates moderate distress. Clinical Social Worker made several attempts by phone to assess for distress and other psychosocial needs. Per MD note, patient may be in SNF at this time.  CSW left message to return call when convenient.  ONCBCN DISTRESS SCREENING 08/09/2015  Screening Type Initial Screening  Distress experienced in past week (1-10) 6  Practical problem type Insurance  Family Problem type   Emotional problem type Adjusting to appearance changes  Spiritual/Religous concerns type Facing my mortality  Information Concerns Type Lack of info about treatment  Physical Problem type    Polo Riley, MSW, LCSW, OSW-C Clinical Social Worker [place] [phone]

## 2015-10-03 ENCOUNTER — Non-Acute Institutional Stay (SKILLED_NURSING_FACILITY): Payer: Medicare Other | Admitting: Adult Health

## 2015-10-03 ENCOUNTER — Encounter: Payer: Self-pay | Admitting: Adult Health

## 2015-10-03 DIAGNOSIS — C50911 Malignant neoplasm of unspecified site of right female breast: Secondary | ICD-10-CM

## 2015-10-03 DIAGNOSIS — M4854XS Collapsed vertebra, not elsewhere classified, thoracic region, sequela of fracture: Secondary | ICD-10-CM | POA: Diagnosis not present

## 2015-10-03 DIAGNOSIS — I10 Essential (primary) hypertension: Secondary | ICD-10-CM

## 2015-10-03 DIAGNOSIS — C782 Secondary malignant neoplasm of pleura: Secondary | ICD-10-CM

## 2015-10-03 LAB — MAGNESIUM: Magnesium: 1.9

## 2015-10-03 NOTE — Progress Notes (Signed)
Patient ID: Lisa [name], female   DOB: [birth year], 75 y.o.   MRN: [id number]   Facility:  [facility]       Allergies  Allergen Reactions  . Diphenhydramine Hcl Other (See Comments)    Severe headaches  . Codeine Sulfate Rash    REACTION: rash. Makes her jittery  . Oseltamivir Phosphate Nausea And Vomiting    REACTION: rash  . Oxycodone-Acetaminophen Nausea And Vomiting    REACTION: emesis  . Prednisone Other (See Comments)    REACTION: hiatal hernia, runny  Nose, felt terrible  . Sudafed [Pseudoephedrine Hcl] Other (See Comments)    vertigo    Chief Complaint  Patient presents with  . Discharge Note    Discharge from facility    HPI:  He is being discharged to home with home health for pt/ot/rn. She requires a standard wheelchair. She needs her prescriptions written for a 30 day supply of her medications. She will need a follow up appointment setup for her.    Past Medical History  Diagnosis Date  . Pleural effusion 11-08-2008  . Chronic back pain   . Hypertension   . Complication of anesthesia   . PONV (postoperative nausea and vomiting)   . Breast cancer ([place]) 07-13-1997    right    Past Surgical History  Procedure Laterality Date  . Abdominal hysterectomy  02-21-1982  . Pelvic laparoscopy  11-06-1981  . Dilation and curettage, diagnostic / therapeutic  12-13-1981  . Breast biopsy  07-13-1997  . Mastectomy  07-29-1997  . Thoracentesis  10-18-2008  . Posterior lumbar fusion 4 level N/A 09/01/2015    Procedure: Thoracic six-Lumbar one fusion with arthrodesis pedicle screw stabilization and decompression;  Surgeon: Ashok Pall, MD;  Location: [facility];  Service: Neurosurgery;  Laterality: N/A;  T6-L1 fusion with arthrodesis pedicle screw stabilization and decompression    VITAL SIGNS BP 149/87 mmHg  Pulse 87  Temp(Src) 98 F (36.7 C) (Oral)  Resp 16  Ht 5\' 3"  (1.6 m)  Wt 172 lb 4 oz (78.132 kg)  BMI 30.52 kg/m2  SpO2 98%  Patient's Medications  New  Prescriptions   No medications on file  Previous Medications   DIAZEPAM (VALIUM) 5 MG TABLET    Take 5 mg by mouth every 6 (six) hours as needed for anxiety.   EPIPEN 2-PAK 0.3 MG/0.3ML SOAJ INJECTION    Inject 0.3 mg into the skin daily as needed (allergic reaction). Reported on 09/15/2015   EXEMESTANE (AROMASIN) 25 MG TABLET    Take 1 tablet (25 mg total) by mouth daily after breakfast.   FERROUS SULFATE 325 (65 FE) MG TABLET    Take 325 mg by mouth 2 (two) times a week. Reported on 08/17/2015   LORATADINE (CLARITIN) 10 MG TABLET    Take 10 mg by mouth daily.    LOSARTAN (COZAAR) 50 MG TABLET    Take 50 mg by mouth daily.     MULTIPLE VITAMINS-MINERALS (MULTIVITAMIN WITH MINERALS) TABLET    Take 1 tablet by mouth daily.     SIMVASTATIN (ZOCOR) 20 MG TABLET    Take 20 mg by mouth at bedtime.     TRAMADOL (ULTRAM) 50 MG TABLET    Take 1-2 tablets (50-100 mg total) by mouth every 4 (four) hours as needed for moderate pain.   VITAMIN B-12 (CYANOCOBALAMIN) 1000 MCG TABLET    Take 1,000 mcg by mouth 2 (two) times daily.   VITAMIN C (ASCORBIC ACID) 500 MG TABLET  Take 500 mg by mouth daily.     VITAMIN E 200 UNIT CAPSULE    Take 200 Units by mouth daily.    Modified Medications   No medications on file  Discontinued Medications   No medications on file     SIGNIFICANT DIAGNOSTIC EXAMS  07-26-15: thoracic spine: Wedge compression fracture of the T11 vertebral body which appears slightly more pronounced than on study from 3 months prior. The patient has a history of breast carcinoma. Given this slight increase in wedging in this area and persistent pain, nonemergent thoracic MR to assess for possible neoplastic involvement of the T11 vertebral body may be reasonable. There is no other fracture. No spondylolisthesis. There is multilevel osteoarthritic change. No paraspinous lesion.   07-26-15: lumbar spine x-ray: Moderate lower lumbar disc and facet degeneration, not  significantly changed.  08-02-15: chest x-ray: Chronic pulmonary scarring. Mild atelectasis at the lung bases, right more than left. Old lower thoracic compression fracture which appears to have progressed slightly.  08-02-15: ct of abdomen and pelvis: Pathologic compression fracture of T10 vertebral body is noted. Lytic metastatic lesions are also noted in the T9 and T11 vertebral bodies, as well as the right iliac wing. Nonobstructive right nephrolithiasis. No hydronephrosis or renal obstruction is noted. Mild diffuse wall thickening of the rectum is noted with surrounding inflammatory changes concerning for proctitis. Large amount of stool is noted in the rectum concerning for impaction.    LABS REVIEWED:   08-02-15: wbc 19.3; hgb 13.7; hct 40.3; mcv87.0; plt 253; glucose 145; bun 61; creat 2.10; k+ 3.9; na++136; ast 61; albumin 3.2; ca++14.2; CK 513; PTH 8; blood culture: no growth 08-04-15: wbc 9.5; hgb 12.1; hct 35.3; mcv 86.7; plt 228; gluocse 135; bun 38; creat 1.55; k+ 2.9; na++141; liver normal albumin 2.5; c-diff: +  Ca++ 11.9 08-06-15: wbc 9.5; hgb 13.5; hct 39.4; mcv 86.2; plt 245; glucose 112; bun 22; creat 1.33; k+ 3.3; na++142; ca++12.2  09-12-15: glucose 114; bun 12.3;creat 0.79; k+ 3.3; na++139     Review of Systems  Constitutional: Negative for malaise/fatigue.  Respiratory: Negative for cough and shortness of breath.   Cardiovascular: Negative for chest pain, palpitations and leg swelling.  Gastrointestinal: Negative for heartburn, abdominal pain, diarrhea and constipation.  Musculoskeletal: Positive for back pain. Negative for myalgias.  Skin: Negative.   Neurological: Negative for dizziness and headaches.  Psychiatric/Behavioral: The patient is not nervous/anxious.     Physical Exam  Constitutional: She is oriented to person, place, and time. She appears well-developed and well-nourished. No distress.  Eyes: Conjunctivae are normal.  Neck: Neck supple. No JVD  present. No thyromegaly present.  Cardiovascular: Normal rate, regular rhythm and intact distal pulses.   Respiratory: Effort normal and breath sounds normal. No respiratory distress. She has no wheezes.  GI: Soft. Bowel sounds are normal. She exhibits no distension. There is no tenderness.  Musculoskeletal: She exhibits no edema.  Able to move all extremities   Lymphadenopathy:    She has no cervical adenopathy.  Neurological: She is alert and oriented to person, place, and time.  Skin: Skin is warm and dry. She is not diaphoretic.  Psychiatric: She has a normal mood and affect.       ASSESSMENT/ PLAN:  Will discharge her to home with home health for pt/ot/rn to evaluate and treat as indicated for gait, balance, strength, adl training and medication management. She requires a standard wheelchair in order to maintain her current level of independence with her adl's  which cannot be achieved with a walker and she can self propel. Her prescriptions have been written for a 30 day supply of her medications with #30 valium 5 mg tabs. The facility is to setup a follow up appointment for her for the next 1-2 weeks.     Time spent with patient  40  minutes >50% time spent counseling; reviewing medical record; tests; labs; and developing future plan of care       Ok Edwards NP [facility]  Contact [phone] Monday through Friday 8am- 5pm  After hours call [phone]

## 2015-10-08 ENCOUNTER — Other Ambulatory Visit: Payer: Self-pay | Admitting: Oncology

## 2015-10-08 DIAGNOSIS — C7951 Secondary malignant neoplasm of bone: Principal | ICD-10-CM

## 2015-10-08 DIAGNOSIS — C50911 Malignant neoplasm of unspecified site of right female breast: Secondary | ICD-10-CM

## 2015-10-12 ENCOUNTER — Ambulatory Visit (HOSPITAL_BASED_OUTPATIENT_CLINIC_OR_DEPARTMENT_OTHER): Payer: Medicare Other

## 2015-10-12 ENCOUNTER — Ambulatory Visit (HOSPITAL_COMMUNITY)
Admission: RE | Admit: 2015-10-12 | Discharge: 2015-10-12 | Disposition: A | Discharge status: Home / Self Care | Payer: Medicare Other | Source: Ambulatory Visit | Attending: Oncology | Admitting: Oncology

## 2015-10-12 ENCOUNTER — Other Ambulatory Visit (HOSPITAL_BASED_OUTPATIENT_CLINIC_OR_DEPARTMENT_OTHER): Payer: Medicare Other

## 2015-10-12 ENCOUNTER — Ambulatory Visit (HOSPITAL_BASED_OUTPATIENT_CLINIC_OR_DEPARTMENT_OTHER): Payer: Medicare Other | Admitting: Oncology

## 2015-10-12 ENCOUNTER — Encounter: Payer: Self-pay | Admitting: Oncology

## 2015-10-12 ENCOUNTER — Other Ambulatory Visit: Payer: Self-pay

## 2015-10-12 VITALS — BP 139/71 | HR 72 | Temp 97.9°F | Resp 18 | Ht 63.0 in | Wt 177.0 lb

## 2015-10-12 DIAGNOSIS — M1711 Unilateral primary osteoarthritis, right knee: Secondary | ICD-10-CM | POA: Insufficient documentation

## 2015-10-12 DIAGNOSIS — C50911 Malignant neoplasm of unspecified site of right female breast: Secondary | ICD-10-CM | POA: Diagnosis not present

## 2015-10-12 DIAGNOSIS — C50919 Malignant neoplasm of unspecified site of unspecified female breast: Secondary | ICD-10-CM | POA: Insufficient documentation

## 2015-10-12 DIAGNOSIS — C7951 Secondary malignant neoplasm of bone: Secondary | ICD-10-CM | POA: Diagnosis not present

## 2015-10-12 DIAGNOSIS — G893 Neoplasm related pain (acute) (chronic): Secondary | ICD-10-CM

## 2015-10-12 DIAGNOSIS — C782 Secondary malignant neoplasm of pleura: Secondary | ICD-10-CM

## 2015-10-12 DIAGNOSIS — M4854XS Collapsed vertebra, not elsewhere classified, thoracic region, sequela of fracture: Secondary | ICD-10-CM

## 2015-10-12 LAB — CBC WITH DIFFERENTIAL/PLATELET
BASO%: 1.3 % (ref 0.0–2.0)
BASOS ABS: 0.1 10*3/uL (ref 0.0–0.1)
EOS ABS: 0.3 10*3/uL (ref 0.0–0.5)
EOS%: 6 % (ref 0.0–7.0)
HCT: 39.7 % (ref 34.8–46.6)
HGB: 13.2 g/dL (ref 11.6–15.9)
LYMPH%: 25.3 % (ref 14.0–49.7)
MCH: 28.5 pg (ref 25.1–34.0)
MCHC: 33.2 g/dL (ref 31.5–36.0)
MCV: 85.7 fL (ref 79.5–101.0)
MONO#: 0.8 10*3/uL (ref 0.1–0.9)
MONO%: 14.5 % — AB (ref 0.0–14.0)
NEUT%: 52.9 % (ref 38.4–76.8)
NEUTROS ABS: 2.9 10*3/uL (ref 1.5–6.5)
PLATELETS: 230 10*3/uL (ref 145–400)
RBC: 4.63 10*6/uL (ref 3.70–5.45)
RDW: 14.4 % (ref 11.2–14.5)
WBC: 5.5 10*3/uL (ref 3.9–10.3)
lymph#: 1.4 10*3/uL (ref 0.9–3.3)

## 2015-10-12 LAB — COMPREHENSIVE METABOLIC PANEL
ALK PHOS: 87 U/L (ref 40–150)
ALT: 21 U/L (ref 0–55)
ANION GAP: 7 meq/L (ref 3–11)
AST: 28 U/L (ref 5–34)
Albumin: 3.7 g/dL (ref 3.5–5.0)
BILIRUBIN TOTAL: 0.76 mg/dL (ref 0.20–1.20)
BUN: 20.3 mg/dL (ref 7.0–26.0)
CO2: 25 meq/L (ref 22–29)
Calcium: 11.7 mg/dL — ABNORMAL HIGH (ref 8.4–10.4)
Chloride: 110 mEq/L — ABNORMAL HIGH (ref 98–109)
Creatinine: 1 mg/dL (ref 0.6–1.1)
EGFR: 59 mL/min/{1.73_m2} — AB (ref >90)
Glucose: 122 mg/dl (ref 70–140)
POTASSIUM: 3.7 meq/L (ref 3.5–5.1)
Sodium: 142 mEq/L (ref 136–145)
TOTAL PROTEIN: 7.3 g/dL (ref 6.4–8.3)

## 2015-10-12 MED ORDER — ZOLEDRONIC ACID 4 MG/100ML IV SOLN
4.0000 mg | Freq: Once | INTRAVENOUS | Status: AC
  Administered 2015-10-12: 4 mg via INTRAVENOUS
  Filled 2015-10-12: qty 100

## 2015-10-12 MED ORDER — ONDANSETRON HCL 8 MG PO TABS: 8.0000 mg | ORAL_TABLET | Freq: Three times a day (TID) | ORAL | Status: DC | PRN

## 2015-10-12 MED ORDER — SODIUM CHLORIDE 0.9 % IV SOLN
Freq: Once | INTRAVENOUS | Status: AC
  Administered 2015-10-12: 12:00:00 via INTRAVENOUS

## 2015-10-12 NOTE — Progress Notes (Signed)
OFFICE PROGRESS NOTE   October 14, 2015   Physicians: J.Kinard, [name], R.Donneta Romberg, A.Kendall. 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 She began Aromasin 09-07-15, with zometa for bone involvement and hypercalcemia. She had surgery for T10 pathologic fracture with retropulsion of bone, this involving T6-L1 by Dr Christella Noa on 09-01-15. She had partial course of radiation prior to that surgery. Patient finally was able to accomplish PET on 09-20-15. She saw Dr Christella Noa since she was here last, doing well from surgery.  Patient returned home last week from SNF, with [facility] and [facility] involved. She is ambulating with walker and feels progressively stronger. She can stop wearing the upper body brace later this week. Most pain now is right lateral back from scapula to iliac crest area, and right hip area, however she is using "Equate" (not clear if this is NSAID or acetaminophen) in preference to tramadol. She is thirsty, note calcium higher today than last visit. She has had some nausea, has no medication for this. She is generally able to eat. Bowels are moving without diarrhea. She denies increased SOB. No fever, no bleeding, no LE swelling, is able to sleep. Environmental allergies have not been bothersome, has yearly appointment with Dr Donneta Romberg upcoming.  Remainder of 10 point Review of Systems negative.   No PAC Declined flu vaccine No genetics testing.  ONCOLOGIC HISTORY The right breast carcinoma initially was T2 N1, ER/PR-positive in December 1998, treated with mastectomy with 12 node axillary evaluation and adjuvant Adriamycin/Cytoxan followed by 5 years of tamoxifen through December 2003. The breast cancer was found metastatic to pleura, hormone positive in early 2010, treated with VATS sclerosis by Dr.Burney April 2010 and on Femara also since April  2010. CA 2729 was 434 in April 2010. She has tolerated Femara well and clinically has continued to do very well, though CA 27.29 has never normalized. 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 Patient seen by medical oncology 08-10-15 and radiation begun shortly afterwards by Dr Sondra Come, 22.5 of planned 35 Gy radiation to area of T10 thru 08-29-15. MRI T spine obtained after some difficulty on 08-29-15, with pathologic compression fracture T10 with retropulsion of compressed vertebra to right with compression of cord called, involvement also T9, T11, L2. She had thoracic six- lumbar one posterolateral arthrodesis, T10 laminectomy, partial laminectomy T9, T11 by Dr Christella Noa on 09-01-15. She was begun on Aromasin on 09-07-15.  PET had been scheduled for 09-04-15, delayed with surgery, accomplished on 09-20-15 with chest adenopathy, increased soft tissue right lung base, extensive bone involvement.   Objective:  Vital signs in last 24 hours:  BP 139/71 mmHg  Pulse 72  Temp(Src) 97.9 F (36.6 C) (Oral)  Resp 18  Ht 5' 3"  (1.6 m)  Wt 177 lb (80.287 kg)  BMI 31.36 kg/m2  SpO2 99% Weight up 5 lbs Alert, oriented and appropriate. Much more easily and quickly mobile today, using rolling walker. No alopecia  HEENT:PERRL, sclerae not icteric. Oral mucosa moist without lesions, posterior pharynx clear.  Neck supple. No JVD.  Lymphatics:no supraclavicular adenopathy Resp: diminished BS right base otherwise clear to auscultation bilaterally  Cardio: regular rate and rhythm. No gallop. GI: soft, nontender, not distended, no mass or organomegaly. Normally active bowel sounds.  Musculoskeletal/ Extremities: UE/LEwithout pitting edema, cords, tenderness. Surgical  Incision along spine  healing well. Neuro: no peripheral neuropathy. Otherwise nonfocal. PSYCH appropriate mood and affect Skin without rash, ecchymosis, petechiae Breasts: right  mastectomy   Lab Results:  Results for orders placed or performed in visit on 10/12/15  CBC with Differential  Result Value Ref Range   WBC 5.5 3.9 - 10.3 10e3/uL   NEUT# 2.9 1.5 - 6.5 10e3/uL   HGB 13.2 11.6 - 15.9 g/dL   HCT 39.7 34.8 - 46.6 %   Platelets 230 145 - 400 10e3/uL   MCV 85.7 79.5 - 101.0 fL   MCH 28.5 25.1 - 34.0 pg   MCHC 33.2 31.5 - 36.0 g/dL   RBC 4.63 3.70 - 5.45 10e6/uL   RDW 14.4 11.2 - 14.5 %   lymph# 1.4 0.9 - 3.3 10e3/uL   MONO# 0.8 0.1 - 0.9 10e3/uL   Eosinophils Absolute 0.3 0.0 - 0.5 10e3/uL   Basophils Absolute 0.1 0.0 - 0.1 10e3/uL   NEUT% 52.9 38.4 - 76.8 %   LYMPH% 25.3 14.0 - 49.7 %   MONO% 14.5 (H) 0.0 - 14.0 %   EOS% 6.0 0.0 - 7.0 %   BASO% 1.3 0.0 - 2.0 %  Comprehensive metabolic panel  Result Value Ref Range   Sodium 142 136 - 145 mEq/L   Potassium 3.7 3.5 - 5.1 mEq/L   Chloride 110 (H) 98 - 109 mEq/L   CO2 25 22 - 29 mEq/L   Glucose 122 70 - 140 mg/dl   BUN 20.3 7.0 - 26.0 mg/dL   Creatinine 1.0 0.6 - 1.1 mg/dL   Total Bilirubin 0.76 0.20 - 1.20 mg/dL   Alkaline Phosphatase 87 40 - 150 U/L   AST 28 5 - 34 U/L   ALT 21 0 - 55 U/L   Total Protein 7.3 6.4 - 8.3 g/dL   Albumin 3.7 3.5 - 5.0 g/dL   Calcium 11.7 (H) 8.4 - 10.4 mg/dL   Anion Gap 7 3 - 11 mEq/L   EGFR 59 (L) >90 ml/min/1.73 m2  CA 15.3  Result Value Ref Range   CA 15-3 251.3 (H) 0.0 - 25.0 U/mL     Studies/Results:  EXAM: NUCLEAR MEDICINE PET SKULL BASE TO THIGH   09-20-15  COMPARISON: 01/10/2010 head CT. Abdomen and pelvis CT from 08/02/2015.  FINDINGS: NECK  No hypermetabolic lymph nodes in the neck.  CHEST  Small lymph nodes are seen in the thoracic inlet bilaterally. These lymph nodes are hypermetabolic with SUV max = in the 05-2011 range. A cluster of lymph nodes in the right thoracic inlet demonstrates SUV max = 11.8 (image 48 series 4).  Mediastinal and bilateral hypermetabolic lymphadenopathy is evident. Although not well seen on this  study without intravenous contrast material, hypermetabolism in the right hilum is presumably related to lymph nodes with SUV max = 17.9. High attenuation lymph nodes seen in the right juxta cardiac fat (image 81 series 4) demonstrate SUV max = 14.8. A large high density pleural-parenchymal lesion in the right lung base was present on the 2011 exam, but shows an increased soft tissue component today measuring approximately 5.0 x 4.6 cm. This area is markedly hypermetabolic with SUV max = 37.  ABDOMEN/PELVIS  No abnormal hypermetabolic activity within the liver, pancreas, adrenal glands, or spleen. No hypermetabolic lymph nodes in the abdomen or pelvis.  Atherosclerotic calcification is seen in the wall of the abdominal aorta. Tiny nonobstructing stone identified interpolar right kidney.  SKELETON  Multiple hypermetabolic bone lesions are seen in the chest, abdomen, and  pelvis. 2.5 x 3.0 cm destructive soft tissue lesion in the anterior right iliac bone demonstrates SUV max = 31. Other hypermetabolic bone lesions are seen in the right scapula, bilateral ribs, thoracic spine, sacrum, and right femoral neck.  IMPRESSION: 1. Hypermetabolic lymph nodes are identified in the thoracic inlet of the chest bilaterally with hypermetabolism in the mediastinum and both hilar regions associated. Features are compatible with metastatic disease. 2. Hypermetabolic pleural disease identified in the right chest. 3. Hypermetabolic bone metastases in the right scapula, thoracolumbar spine, sacrum, bony pelvis, and right femoral neck. 4. Atherosclerosis of the abdominal aorta and coronary arteries.  PACs images reviewed.    RIGHT FEMUR 1 VIEW  10-12-15  COMPARISON: PET-CT of 09/20/2015  FINDINGS: The lesion involving the right iliac wing just below the anterior superior iliac spine is faintly visualized by plain film with some periosteal reaction present. However, no lytic or blastic  lesion is evident involving the right femoral neck. No impending fracture is seen. The right femur is unremarkable. There is degenerative change in the right knee primarily involving the medial compartment with there is loss of joint space and sclerosis.  IMPRESSION: 1. Negative right femur for metastatic disease by plain film with no evidence of impending fracture. 2. Metastatic lesion involves the right iliac wing laterally as noted above. 3. Degenerative change in the right knee.    Medications: I have reviewed the patient's current medications. Script for prn zofran.  DISCUSSION PET information reviewed.  Right back and hip pain likely related to those areas of involviement.  Plain xrays of right femur and iliac crest obtained after visit, no impending fracture femoral neck, this report conveyed by MD phone call to patient after visit today.  We are encouraged that she continues to improve from spine surgery and agree with careful home PT. She will keep appointment with Dr Sondra Come 10-19-15, to decide about additional radiation to spine and/or other areas.  Continue exemestane. Zometa today and at least monthly. Needs to continue good oral hydration.    Assessment/Plan:  1.progressive metastatic breast cancer now involving right lung base/ pleura, chest adenopathy, and extensive bone involvement: pathologic compression fracture T10 and retropulsion of bone impinging on spinal cord for which she had T6 - L1 posterolateral arthrodesis with T10 laminectomy and T9/ T11 partial laminectomy by Dr Christella Noa on 09-01-15. Continue exemestane and zometa.  2.post partial course of radiation thru 08-29-15 to T10 region. Return appointment to Dr Sondra Come 10-19-15 for possible completion of radiation course and/ or other areas including possibly right iliac wing. No impending fracture right femoral neck by plain xray. 3.hypercalcemia and metastatic bone involvement: calcium higher again today, zometa  already planned for today. Follow chemistries. 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 Femara DCd 08-10-15; Aromasin begun ~ 09-06-15.[name] PET finally accomplished, results as noted.  5.diarrhea Jan treated for C diff ( Ab + toxin -) and resolved. 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 Now back at home with mother, [place] involved.   10.Advance Directives in place 11.protein calorie malnutrition: discussed need for improved nutrition to recover from surgery. 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 Zometa orders confirmed. Time spent 30 min including >50% counseling and coordination of  care. Cc Drs Philip Aspen, Parke Simmers, MD   10/14/2015, 10:08 PM

## 2015-10-12 NOTE — Patient Instructions (Signed)

## 2015-10-12 NOTE — Progress Notes (Signed)
1215- Pt received Zometa today. Assisted pt to [facility] Radiology department after treatment. Pt AVS printed out and is aware of future infusion schedule.

## 2015-10-13 ENCOUNTER — Telehealth: Payer: Self-pay

## 2015-10-13 ENCOUNTER — Telehealth: Payer: Self-pay | Admitting: Oncology

## 2015-10-13 LAB — CANCER ANTIGEN 15-3: CA 15-3: 251.3 U/mL — ABNORMAL HIGH (ref 0.0–25.0)

## 2015-10-13 NOTE — Telephone Encounter (Signed)
Spoke with patient to confirm added lab appt on 3/30 and f/u appt in April

## 2015-10-13 NOTE — Telephone Encounter (Signed)
Incoming call from scheduler at [phone]. The pt wanted Lisa [name] to know she did get sick on her stomach after zometa yesterday.  lvm that she should be using her zofran and if that is helping that is good, also she can use some gatorade or pedialyte to help in the shifting of her electrolytes after the zometa. Please call Lisa [name] back if she gets worse - vomiting, diarrhea, feeling like she is dehydrated.

## 2015-10-18 ENCOUNTER — Encounter: Payer: Self-pay | Admitting: Oncology

## 2015-10-18 ENCOUNTER — Other Ambulatory Visit: Payer: Self-pay | Admitting: *Deleted

## 2015-10-18 DIAGNOSIS — C50919 Malignant neoplasm of unspecified site of unspecified female breast: Secondary | ICD-10-CM

## 2015-10-19 ENCOUNTER — Other Ambulatory Visit (HOSPITAL_BASED_OUTPATIENT_CLINIC_OR_DEPARTMENT_OTHER): Payer: Medicare Other

## 2015-10-19 ENCOUNTER — Ambulatory Visit
Admission: RE | Admit: 2015-10-19 | Discharge: 2015-10-19 | Disposition: A | Discharge status: Home / Self Care | Payer: Medicare Other | Source: Ambulatory Visit | Attending: Radiation Oncology | Admitting: Radiation Oncology

## 2015-10-19 VITALS — BP 132/74 | HR 90 | Temp 98.1°F | Ht 63.0 in | Wt 175.4 lb

## 2015-10-19 DIAGNOSIS — C50911 Malignant neoplasm of unspecified site of right female breast: Secondary | ICD-10-CM | POA: Diagnosis not present

## 2015-10-19 DIAGNOSIS — C50919 Malignant neoplasm of unspecified site of unspecified female breast: Secondary | ICD-10-CM

## 2015-10-19 DIAGNOSIS — C7951 Secondary malignant neoplasm of bone: Secondary | ICD-10-CM

## 2015-10-19 LAB — CBC WITH DIFFERENTIAL/PLATELET
BASO%: 1.2 % (ref 0.0–2.0)
Basophils Absolute: 0.1 10*3/uL (ref 0.0–0.1)
EOS ABS: 0.3 10*3/uL (ref 0.0–0.5)
EOS%: 6.8 % (ref 0.0–7.0)
HCT: 40.2 % (ref 34.8–46.6)
HEMOGLOBIN: 13.3 g/dL (ref 11.6–15.9)
LYMPH%: 28.6 % (ref 14.0–49.7)
MCH: 28.3 pg (ref 25.1–34.0)
MCHC: 33 g/dL (ref 31.5–36.0)
MCV: 85.6 fL (ref 79.5–101.0)
MONO#: 0.5 10*3/uL (ref 0.1–0.9)
MONO%: 11.2 % (ref 0.0–14.0)
NEUT%: 52.2 % (ref 38.4–76.8)
NEUTROS ABS: 2.3 10*3/uL (ref 1.5–6.5)
Platelets: 244 10*3/uL (ref 145–400)
RBC: 4.7 10*6/uL (ref 3.70–5.45)
RDW: 14.8 % — AB (ref 11.2–14.5)
WBC: 4.4 10*3/uL (ref 3.9–10.3)
lymph#: 1.3 10*3/uL (ref 0.9–3.3)

## 2015-10-19 LAB — COMPREHENSIVE METABOLIC PANEL
ALBUMIN: 3.8 g/dL (ref 3.5–5.0)
ALK PHOS: 82 U/L (ref 40–150)
ALT: 20 U/L (ref 0–55)
AST: 27 U/L (ref 5–34)
Anion Gap: 9 mEq/L (ref 3–11)
BILIRUBIN TOTAL: 0.84 mg/dL (ref 0.20–1.20)
BUN: 18.3 mg/dL (ref 7.0–26.0)
CO2: 23 meq/L (ref 22–29)
CREATININE: 1.1 mg/dL (ref 0.6–1.1)
Calcium: 11 mg/dL — ABNORMAL HIGH (ref 8.4–10.4)
Chloride: 110 mEq/L — ABNORMAL HIGH (ref 98–109)
EGFR: 49 mL/min/{1.73_m2} — ABNORMAL LOW (ref >90)
GLUCOSE: 123 mg/dL (ref 70–140)
Potassium: 3.6 mEq/L (ref 3.5–5.1)
SODIUM: 142 meq/L (ref 136–145)
TOTAL PROTEIN: 7.5 g/dL (ref 6.4–8.3)

## 2015-10-19 NOTE — Progress Notes (Signed)
[name] here for follow up with her Aunt.  She reports having pain at a 3/10 in her thoracic spine area and takes tylenol and tramadol as needed  She had surgery on 09/01/15 (POSTERIOR LUMBAR FUSION 4 LEVEL).  She is ambulating with a walker.  She is working with physical therapy.  She reports having fatigue.  She is wondering if she needs more radiation.  BP 132/74 mmHg  Pulse 90  Temp(Src) 98.1 F (36.7 C) (Oral)  Ht 5\' 3"  (1.6 m)  Wt 175 lb 6.4 oz (79.561 kg)  BMI 31.08 kg/m2   Wt Readings from Last 3 Encounters:  10/19/15 175 lb 6.4 oz (79.561 kg)  10/12/15 177 lb (80.287 kg)  10/03/15 172 lb 4 oz (78.132 kg)

## 2015-10-19 NOTE — Progress Notes (Signed)
Radiation Oncology         [id number]) [id number] ________________________________  Name: Lisa [name] MRN: [id number]  Date: 10/19/2015  DOB: 09/03/1940  Follow-Up Visit Note  CC: Lisa Lopes, MD  Lisa Levan, MD   Diagnosis: Metastatic breast cancer with osseous metastasis  Indication for treatment:Back pain and pathologic fracture T10   Radiation treatment dates: 08/17/2015-08/29/2015  Site/dose: Lower thoracic spine 22.5 gray in 9 fractions  Narrative:  The patient returns today for routine follow-up. Lisa [name] here for follow up with her Aunt. She reports having pain at a 3/10 in her thoracic spine area and takes tylenol and tramadol as needed She had surgery on 09/01/15, thoracolumbar fusion for stabilization and a decompressive laminectomy. she states she is recovering well from this.She is ambulating with a walker. She is working with physical therapy. She reports having fatigue. She is wondering if she needs more radiation. Denies numbness in legs,                             ALLERGIES:  is allergic to diphenhydramine hcl; codeine sulfate; oseltamivir phosphate; oxycodone-acetaminophen; prednisone; and sudafed.  Meds: Current Outpatient Prescriptions  Medication Sig Dispense Refill  . acetaminophen (TYLENOL) 500 MG tablet Take 500 mg by mouth every 6 (six) hours as needed.    [name] exemestane (AROMASIN) 25 MG tablet Take 1 tablet (25 mg total) by mouth daily after breakfast. 30 tablet 3  . ferrous sulfate 325 (65 FE) MG tablet Take 325 mg by mouth 2 (two) times a week. Reported on 08/17/2015    . loratadine (CLARITIN) 10 MG tablet Take 10 mg by mouth daily.     [name] losartan (COZAAR) 50 MG tablet Take 50 mg by mouth daily.      . Multiple Vitamins-Minerals (MULTIVITAMIN WITH MINERALS) tablet Take 1 tablet by mouth daily.      . ondansetron (ZOFRAN) 8 MG tablet Take 1 tablet (8 mg total) by mouth every 8 (eight) hours as needed for nausea or vomiting. 20  tablet 0  . simvastatin (ZOCOR) 20 MG tablet Take 20 mg by mouth at bedtime.      . traMADol (ULTRAM) 50 MG tablet Take 1-2 tablets (50-100 mg total) by mouth every 4 (four) hours as needed for moderate pain. 15 tablet 0  . vitamin B-12 (CYANOCOBALAMIN) 1000 MCG tablet Take 1,000 mcg by mouth 2 (two) times daily.    . vitamin C (ASCORBIC ACID) 500 MG tablet Take 500 mg by mouth daily.      . vitamin E 200 UNIT capsule Take 200 Units by mouth daily.       No current facility-administered medications for this encounter.    Physical Findings: The patient is in no acute distress. Patient is alert and oriented.  height is 5\' 3"  (1.6 m) and weight is 175 lb 6.4 oz (79.561 kg). Her oral temperature is 98.1 F (36.7 C). Her blood pressure is 132/74 and her pulse is 90. .  No significant changes. No palpable cervical, supraclavicular or axillary lymphoadenopathy. The heart has a regular rate and rhythm. The lungs are clear to auscultation. Long vertical scar, healed well Along the back region. Lower motor strength 5/5 in proximal and distal groups.   Lab Findings: Lab Results  Component Value Date   WBC 4.4 10/19/2015   HGB 13.3 10/19/2015   HCT 40.2 10/19/2015   MCV 85.6 10/19/2015   PLT 244 10/19/2015  Radiographic Findings: Nm Pet Image Initial (pi) Skull Base To Thigh  09/20/2015  CLINICAL DATA:  Subsequent treatment strategy for breast cancer. EXAM: NUCLEAR MEDICINE PET SKULL BASE TO THIGH TECHNIQUE: 8.5 mCi F-18 FDG was injected intravenously. Full-ring PET imaging was performed from the skull base to thigh after the radiotracer. CT data was obtained and used for attenuation correction and anatomic localization. FASTING BLOOD GLUCOSE:  Value: 99 mg/dl COMPARISON:  01/10/2010 head CT. Abdomen and pelvis CT from 08/02/2015. FINDINGS: NECK No hypermetabolic lymph nodes in the neck. CHEST Small lymph nodes are seen in the thoracic inlet bilaterally. These lymph nodes are hypermetabolic with SUV  max = in the 05-2011 range. A cluster of lymph nodes in the right thoracic inlet demonstrates SUV max = 11.8 (image 48 series 4). Mediastinal and bilateral hypermetabolic lymphadenopathy is evident. Although not well seen on this study without intravenous contrast material, hypermetabolism in the right hilum is presumably related to lymph nodes with SUV max = 17.9. High attenuation lymph nodes seen in the right juxta cardiac fat (image 81 series 4) demonstrate SUV max = 14.8. A large high density pleural-parenchymal lesion in the right lung base was present on the 2011 exam, but shows an increased soft tissue component today measuring approximately 5.0 x 4.6 cm. This area is markedly hypermetabolic with SUV max = 37. ABDOMEN/PELVIS No abnormal hypermetabolic activity within the liver, pancreas, adrenal glands, or spleen. No hypermetabolic lymph nodes in the abdomen or pelvis. Atherosclerotic calcification is seen in the wall of the abdominal aorta. Tiny nonobstructing stone identified interpolar right kidney. SKELETON Multiple hypermetabolic bone lesions are seen in the chest, abdomen, and pelvis. 2.5 x 3.0 cm destructive soft tissue lesion in the anterior right iliac bone demonstrates SUV max = 31. Other hypermetabolic bone lesions are seen in the right scapula, bilateral ribs, thoracic spine, sacrum, and right femoral neck. IMPRESSION: 1. Hypermetabolic lymph nodes are identified in the thoracic inlet of the chest bilaterally with hypermetabolism in the mediastinum and both hilar regions associated. Features are compatible with metastatic disease. 2. Hypermetabolic pleural disease identified in the right chest. 3. Hypermetabolic bone metastases in the right scapula, thoracolumbar spine, sacrum, bony pelvis, and right femoral neck. 4. Atherosclerosis of the abdominal aorta and coronary arteries. Electronically Signed   By: Misty Stanley M.D.   On: 09/20/2015 13:44   Dg Femur 1v Right  10/12/2015  CLINICAL DATA:   History of breast carcinoma with known metastasis to bone, evaluate for impending fracture EXAM: RIGHT FEMUR 1 VIEW COMPARISON:  PET-CT of 09/20/2015 FINDINGS: The lesion involving the right iliac wing just below the anterior superior iliac spine is faintly visualized by plain film with some periosteal reaction present. However, no lytic or blastic lesion is evident involving the right femoral neck. No impending fracture is seen. The right femur is unremarkable. There is degenerative change in the right knee primarily involving the medial compartment with there is loss of joint space and sclerosis. IMPRESSION: 1. Negative right femur for metastatic disease by plain film with no evidence of impending fracture. 2. Metastatic lesion involves the right iliac wing laterally as noted above. 3. Degenerative change in the right knee. Electronically Signed   By: Ivar Drape M.D.   On: 10/12/2015 16:02    Impression:  The patient is recovering from the effects of radiation.  Doing well since post-op and post-radiation.  Plan:  Follow up PRN. Close follow with Dr. Marko Plume. The patient completed an abbreviated course of radiation therapy  in light of her findings of cord compression and subsequent surgery. Since she achieved a good pre-operative dose I would not recommend finishing the last 3-4 radiation treatments in this situation.  -----------------------------------  Blair Promise, PhD, MD  This document serves as a record of services personally performed by Gery Pray, MD. It was created on his behalf by Derek Mound, a trained medical scribe. The creation of this record is based on the scribe's personal observations and the provider's statements to them. This document has been checked and approved by the attending provider.

## 2015-10-27 ENCOUNTER — Telehealth: Payer: Self-pay

## 2015-10-27 DIAGNOSIS — C7951 Secondary malignant neoplasm of bone: Secondary | ICD-10-CM

## 2015-10-27 DIAGNOSIS — C50919 Malignant neoplasm of unspecified site of unspecified female breast: Secondary | ICD-10-CM

## 2015-10-27 NOTE — Telephone Encounter (Signed)
Patient Demographics     Patient Name Sex DOB SSN Address Phone    Lisa [name], Lisa [name] Female 29-Aug-1940 [id number] [place] [address] [phone] (Home)      Message  Received: 1 week ago    Gordy Levan, MD  Janace Hoard, RN; Baruch Merl, RN           May need to repeat calcium before I see her again on 4-20.   RN please check on her by phone in ~ a week - she is back at home. If still very thirsty please repeat stat CMET - could see K.Curcio with lab / possible IVF on 4-10 if needed   thanks

## 2015-10-27 NOTE — Telephone Encounter (Signed)
Lisa [name] states that she is thirsty.  Mouth dry.  The thirst may be slightly less then it was ~1 week ago. She is not experiencing any increase in somnolence.  She is experiencing pain in both hips. The left shoulder arm joint hurting.  The pain level is 5-6/10.  She uses Tylenol 325 mg to 500 mg prn with good effect. Pain decreases to  2-3. Discussed Dr. Mariana Kaufman concern that her calcium could potentially increase and she wants to keep a close watch.   She is agreeable to come in for lab and visit with [name], NP on Monday 10-30-15.  Lab at [place] (Stat C-met) visit at 0915.  Set patient up for possible IVF at the [facility] @ 1030 if She needs IVF for hypercalcinemia. (no opening at [facility] until ~1330)  Orders would need to be entered for [facility]. The clinic would need to be called if IVF not needed.

## 2015-10-30 ENCOUNTER — Encounter: Payer: Self-pay | Admitting: Oncology

## 2015-10-30 ENCOUNTER — Ambulatory Visit (HOSPITAL_BASED_OUTPATIENT_CLINIC_OR_DEPARTMENT_OTHER): Payer: Medicare Other | Admitting: Oncology

## 2015-10-30 ENCOUNTER — Ambulatory Visit (HOSPITAL_BASED_OUTPATIENT_CLINIC_OR_DEPARTMENT_OTHER): Payer: Medicare Other

## 2015-10-30 ENCOUNTER — Ambulatory Visit (HOSPITAL_COMMUNITY): Admission: RE | Admit: 2015-10-30 | Payer: Medicare Other | Source: Ambulatory Visit

## 2015-10-30 VITALS — BP 129/73 | HR 84 | Temp 98.3°F | Resp 18 | Ht 63.0 in | Wt 171.5 lb

## 2015-10-30 DIAGNOSIS — C50911 Malignant neoplasm of unspecified site of right female breast: Secondary | ICD-10-CM

## 2015-10-30 DIAGNOSIS — C7951 Secondary malignant neoplasm of bone: Secondary | ICD-10-CM

## 2015-10-30 DIAGNOSIS — C782 Secondary malignant neoplasm of pleura: Secondary | ICD-10-CM

## 2015-10-30 DIAGNOSIS — C50919 Malignant neoplasm of unspecified site of unspecified female breast: Secondary | ICD-10-CM

## 2015-10-30 DIAGNOSIS — C7800 Secondary malignant neoplasm of unspecified lung: Secondary | ICD-10-CM

## 2015-10-30 LAB — COMPREHENSIVE METABOLIC PANEL
ALBUMIN: 3.6 g/dL (ref 3.5–5.0)
ALK PHOS: 76 U/L (ref 40–150)
ALT: 17 U/L (ref 0–55)
AST: 27 U/L (ref 5–34)
Anion Gap: 9 mEq/L (ref 3–11)
BUN: 19.5 mg/dL (ref 7.0–26.0)
CO2: 20 mEq/L — ABNORMAL LOW (ref 22–29)
Calcium: 10.7 mg/dL — ABNORMAL HIGH (ref 8.4–10.4)
Chloride: 110 mEq/L — ABNORMAL HIGH (ref 98–109)
Creatinine: 1 mg/dL (ref 0.6–1.1)
EGFR: 57 mL/min/{1.73_m2} — AB (ref >90)
Glucose: 104 mg/dl (ref 70–140)
Potassium: 3.9 mEq/L (ref 3.5–5.1)
SODIUM: 140 meq/L (ref 136–145)
TOTAL PROTEIN: 7.1 g/dL (ref 6.4–8.3)
Total Bilirubin: 0.72 mg/dL (ref 0.20–1.20)

## 2015-10-30 MED ORDER — SODIUM CHLORIDE 0.9 % IV SOLN
INTRAVENOUS | Status: DC
  Administered 2015-10-30: 10:00:00 via INTRAVENOUS

## 2015-10-30 NOTE — Progress Notes (Signed)
OFFICE PROGRESS NOTE   October 30, 2015   Physicians: J.Kinard, [name], R.Donneta Romberg, A.Kendall. 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 She began Aromasin 09-07-15, with zometa for bone involvement and hypercalcemia. She had surgery for T10 pathologic fracture with retropulsion of bone, this involving T6-L1 by Dr Christella Noa on 09-01-15. She had partial course of radiation prior to that surgery. Patient finally was able to accomplish PET on 09-20-15. She has recovered well from surgery.  Patient is ambulating with walker and feels progressively stronger. Was able to drive herself here today. Using 500 mg of Tylenol as needed which helps her pain.  She is thirsty, note calcium was elevated at last visit. She has had some nausea, uses Zofran as needed. She is generally able to eat. Has some constipation. Uses stool softeners, but not consistently. Not interested in starting MiraLax.  She denies increased SOB. No fever, no bleeding, no LE swelling, is able to sleep.  Remainder of 10 point Review of Systems negative.   No PAC Declined flu vaccine No genetics testing.  ONCOLOGIC HISTORY The right breast carcinoma initially was T2 N1, ER/PR-positive in December 1998, treated with mastectomy with 12 node axillary evaluation and adjuvant Adriamycin/Cytoxan followed by 5 years of tamoxifen through December 2003. 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 CA 2729 was 434 in April 2010. She has tolerated Femara well and clinically has continued to do very well, though CA 27.29 has never normalized. 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 Patient seen by medical oncology  08-10-15 and radiation begun shortly afterwards by Dr Sondra Come, 22.5 of planned 35 Gy radiation to area of T10 thru 08-29-15. MRI T spine obtained after some difficulty on 08-29-15, with pathologic compression fracture T10 with retropulsion of compressed vertebra to right with compression of cord called, involvement also T9, T11, L2. She had thoracic six- lumbar one posterolateral arthrodesis, T10 laminectomy, partial laminectomy T9, T11 by Dr Christella Noa on 09-01-15. She was begun on Aromasin on 09-07-15.  PET had been scheduled for 09-04-15, delayed with surgery, accomplished on 09-20-15 with chest adenopathy, increased soft tissue right lung base, extensive bone involvement.   Objective:  Vital signs in last 24 hours:  BP 129/73 mmHg  Pulse 84  Temp(Src) 98.3 F (36.8 C) (Oral)  Resp 18  Ht 5' 3"  (1.6 m)  Wt 171 lb 8 oz (77.792 kg)  BMI 30.39 kg/m2  SpO2 99% Weight down 5 lbs.  Alert, oriented and appropriate. Using rolling walker. No alopecia  HEENT:PERRL, sclerae not icteric. Oral mucosa moist without lesions, posterior pharynx clear.  Neck supple. No JVD.  Lymphatics:no supraclavicular adenopathy Resp: diminished BS right base otherwise clear to auscultation bilaterally  Cardio: regular rate and rhythm. No gallop. GI: soft, nontender, not distended, no mass or organomegaly. Normally active bowel sounds.  Musculoskeletal/ Extremities: UE/LEwithout pitting edema, cords, tenderness. Surgical  Incision along spine healing well. Neuro: no peripheral neuropathy. Otherwise nonfocal. PSYCH appropriate mood and affect Skin without rash, ecchymosis, petechiae Breasts: right mastectomy   Lab Results:  Results for orders placed or performed in visit on 10/30/15  Comprehensive metabolic panel  Result Value Ref Range   Sodium 140 136 - 145 mEq/L   Potassium  3.9 3.5 - 5.1 mEq/L   Chloride 110 (H) 98 - 109 mEq/L   CO2 20 (L) 22 - 29 mEq/L   Glucose 104 70 - 140 mg/dl   BUN 19.5 7.0 - 26.0 mg/dL    Creatinine 1.0 0.6 - 1.1 mg/dL   Total Bilirubin 0.72 0.20 - 1.20 mg/dL   Alkaline Phosphatase 76 40 - 150 U/L   AST 27 5 - 34 U/L   ALT 17 0 - 55 U/L   Total Protein 7.1 6.4 - 8.3 g/dL   Albumin 3.6 3.5 - 5.0 g/dL   Calcium 10.7 (H) 8.4 - 10.4 mg/dL   Anion Gap 9 3 - 11 mEq/L   EGFR 57 (L) >90 ml/min/1.73 m2     Studies/Results:  EXAM: NUCLEAR MEDICINE PET SKULL BASE TO THIGH   09-20-15  COMPARISON: 01/10/2010 head CT. Abdomen and pelvis CT from 08/02/2015.  FINDINGS: NECK  No hypermetabolic lymph nodes in the neck.  CHEST  Small lymph nodes are seen in the thoracic inlet bilaterally. These lymph nodes are hypermetabolic with SUV max = in the 05-2011 range. A cluster of lymph nodes in the right thoracic inlet demonstrates SUV max = 11.8 (image 48 series 4).  Mediastinal and bilateral hypermetabolic lymphadenopathy is evident. Although not well seen on this study without intravenous contrast material, hypermetabolism in the right hilum is presumably related to lymph nodes with SUV max = 17.9. High attenuation lymph nodes seen in the right juxta cardiac fat (image 81 series 4) demonstrate SUV max = 14.8. A large high density pleural-parenchymal lesion in the right lung base was present on the 2011 exam, but shows an increased soft tissue component today measuring approximately 5.0 x 4.6 cm. This area is markedly hypermetabolic with SUV max = 37.  ABDOMEN/PELVIS  No abnormal hypermetabolic activity within the liver, pancreas, adrenal glands, or spleen. No hypermetabolic lymph nodes in the abdomen or pelvis.  Atherosclerotic calcification is seen in the wall of the abdominal aorta. Tiny nonobstructing stone identified interpolar right kidney.  SKELETON  Multiple hypermetabolic bone lesions are seen in the chest, abdomen, and pelvis. 2.5 x 3.0 cm destructive soft tissue lesion in the anterior right iliac bone demonstrates SUV max = 31.  Other hypermetabolic bone lesions are seen in the right scapula, bilateral ribs, thoracic spine, sacrum, and right femoral neck.  IMPRESSION: 1. Hypermetabolic lymph nodes are identified in the thoracic inlet of the chest bilaterally with hypermetabolism in the mediastinum and both hilar regions associated. Features are compatible with metastatic disease. 2. Hypermetabolic pleural disease identified in the right chest. 3. Hypermetabolic bone metastases in the right scapula, thoracolumbar spine, sacrum, bony pelvis, and right femoral neck. 4. Atherosclerosis of the abdominal aorta and coronary arteries.  PACs images reviewed.    RIGHT FEMUR 1 VIEW  10-12-15  COMPARISON: PET-CT of 09/20/2015  FINDINGS: The lesion involving the right iliac wing just below the anterior superior iliac spine is faintly visualized by plain film with some periosteal reaction present. However, no lytic or blastic lesion is evident involving the right femoral neck. No impending fracture is seen. The right femur is unremarkable. There is degenerative change in the right knee primarily involving the medial compartment with there is loss of joint space and sclerosis.  IMPRESSION: 1. Negative right femur for metastatic disease by plain film with no evidence of impending fracture. 2. Metastatic lesion involves the right iliac wing laterally as noted above. 3. Degenerative change in the right knee.    Medications:  I have reviewed the patient's current medications. She has stopped all calcium supplements.   DISCUSSION Labs reviewed. Ca++ is 10.7; elevated, but improved from last visit 1 Liter NS today. Needs to continue good oral hydration. Continue exemestane. Zometa monthly. Last given 10-12-15.     Assessment/Plan:  1.progressive metastatic breast cancer now involving right lung base/ pleura, chest adenopathy, and extensive bone involvement: pathologic compression fracture T10 and  retropulsion of bone impinging on spinal cord for which she had T6 - L1 posterolateral arthrodesis with T10 laminectomy and T9/ T11 partial laminectomy by Dr Christella Noa on 09-01-15. Continue exemestane and zometa.  2.post partial course of radiation thru 08-29-15 to T10 region. Return appointment to Dr Sondra Come 10-19-15 for possible completion of radiation course and/ or other areas including possibly right iliac wing. No impending fracture right femoral neck by plain xray. 3.hypercalcemia and metastatic bone involvement: elevated today, zometa given 3-23. IVF today. Follow chemistries. 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 Femara DCd 08-10-15; Aromasin begun ~ 09-06-15.[name] PET finally accomplished, results as noted.  5.diarrhea Jan treated for C diff ( Ab + toxin -) and resolved. 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 Now back at home with mother, [place] involved.   10.Advance Directives in place 11.protein calorie malnutrition: discussed need for improved nutrition to recover from surgery. 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 IVF ordered entered. Time spent 30 min including >50% counseling and coordination of care. Cc Drs Coletta Memos, NP   10/30/2015, 4:09 PM

## 2015-10-30 NOTE — Patient Instructions (Signed)
Hypercalcemia Hypercalcemia is having too much calcium in the blood. The body needs calcium to make bones and keep them strong. Calcium also helps the muscles, nerves, brain, and heart work the way they should. Most of the calcium in the body is in the bones. There is also some calcium in the blood. Hypercalcemia can happen when calcium comes out of the bones, or when the kidneys are not able to remove calcium from the blood. Hypercalcemia can be mild or severe. CAUSES There are many possible causes of hypercalcemia. Common causes include:  Hyperparathyroidism. This is a condition in which the body produces too much parathyroid hormone. There are four parathyroid glands in your neck. These glands produce a chemical messenger (hormone) that helps the body absorb calcium from foods and helps your bones release calcium.  Certain kinds of cancer, such as lung cancer, breast cancer, or myeloma. Less common causes of hypercalcemia include:   Getting too much calcium or vitamin D from your diet.  Kidney failure.  Hyperthyroidism.  Being on bed rest for a long time.  Certain medicines.  Infections.  Sarcoidosis. RISK FACTORS This condition is more likely to develop in:  Women.  People who are 60 years or older.  People who have a family history of hypercalcemia. SYMPTOMS  Mild hypercalcemia that starts slowly may not cause symptoms. Severe, sudden hypercalcemia is more likely to cause symptoms, such as:  Loss of appetite.  Increased thirst and frequent urination.  Fatigue.  Nausea and vomiting.  Headache.  Abdominal pain.  Muscle pain, twitching, or weakness.  Constipation.  Blood in the urine.  Pain in the side of the back (flank pain).  Anxiety, confusion, or depression.  Irregular heartbeat (arrhythmia).  Loss of consciousness. DIAGNOSIS  This condition may be diagnosed based on:   Your symptoms.  Blood tests.  Urine  tests.  X-rays.  Ultrasound.  MRI.  CT scan. TREATMENT  Treatment for hypercalcemia depends on the cause. Treatment may include:  Receiving fluids through an IV tube.  Medicines that keep calcium levels steady after receiving fluids (loop diuretics).  Medicines that keep calcium in your bones (bisphosphonates).  Medicines that lower the calcium level in your blood.  Surgery to remove overactive parathyroid glands. HOME CARE INSTRUCTIONS  Take over-the-counter and prescription medicines only as told by your health care provider.  Follow instructions from your health care provider about eating or drinking restrictions.  Drink enough fluid to keep your urine clear or pale yellow.  Stay active. Weight-bearing exercise helps to keep calcium in your bones. Follow instructions from your health care provider about what type and level of exercise is safe for you.  Keep all follow-up visits as told by your health care provider. This is important. SEEK MEDICAL CARE IF:  You have a fever.  You have flank or abdominal pain that is getting worse. SEEK IMMEDIATE MEDICAL CARE IF:   You have severe abdominal or flank pain.  You have chest pain.  You have trouble breathing.  You become very confused and sleepy.  You lose consciousness.   This information is not intended to replace advice given to you by your health care provider. Make sure you discuss any questions you have with your health care provider.   Document Released: 09/21/2004 Document Revised: 03/29/2015 Document Reviewed: 11/23/2014 Elsevier Interactive Patient Education 2016 [employer]  

## 2015-10-30 NOTE — Progress Notes (Signed)
Pt was discharged by [facility] RN

## 2015-10-31 ENCOUNTER — Other Ambulatory Visit: Payer: Self-pay | Admitting: Adult Health

## 2015-11-03 ENCOUNTER — Other Ambulatory Visit: Payer: Self-pay | Admitting: Adult Health

## 2015-11-03 ENCOUNTER — Other Ambulatory Visit: Payer: Self-pay

## 2015-11-03 DIAGNOSIS — C7951 Secondary malignant neoplasm of bone: Principal | ICD-10-CM

## 2015-11-03 DIAGNOSIS — C50919 Malignant neoplasm of unspecified site of unspecified female breast: Secondary | ICD-10-CM

## 2015-11-03 MED ORDER — EXEMESTANE 25 MG PO TABS: 25.0000 mg | ORAL_TABLET | Freq: Every day | ORAL | Status: DC

## 2015-11-03 NOTE — Telephone Encounter (Signed)
Sent refill in to [name] and called Ms. Renzo to let her know that this prescription was refilled.

## 2015-11-03 NOTE — Telephone Encounter (Signed)
Continue Aromasin   thanks

## 2015-11-03 NOTE — Telephone Encounter (Signed)
Patient called stating that she has been trying to get a hold of someone to find out whether Dr. Marko Plume wants her to stay on aromasin.  She only has one tablet left to take tomorrow.  Patient did not leave a VM, she called [facility] for the refill. Writer touched base with Barbaraann Share, RN who will call the patient today.

## 2015-11-03 NOTE — Addendum Note (Signed)
Addended by: Baruch Merl on: 11/03/2015 04:29 PM   Modules accepted: Orders

## 2015-11-05 ENCOUNTER — Other Ambulatory Visit: Payer: Self-pay | Admitting: Oncology

## 2015-11-05 DIAGNOSIS — C50919 Malignant neoplasm of unspecified site of unspecified female breast: Secondary | ICD-10-CM

## 2015-11-07 ENCOUNTER — Other Ambulatory Visit: Payer: Self-pay

## 2015-11-07 DIAGNOSIS — C7951 Secondary malignant neoplasm of bone: Principal | ICD-10-CM

## 2015-11-07 DIAGNOSIS — C50919 Malignant neoplasm of unspecified site of unspecified female breast: Secondary | ICD-10-CM

## 2015-11-07 NOTE — Telephone Encounter (Signed)
Automated refill request from [facility]. Noted 11/03/15 exemestane refill order sent. Called [facility] with this information and lvm.

## 2015-11-09 ENCOUNTER — Other Ambulatory Visit (HOSPITAL_BASED_OUTPATIENT_CLINIC_OR_DEPARTMENT_OTHER): Payer: Medicare Other

## 2015-11-09 ENCOUNTER — Other Ambulatory Visit: Payer: Self-pay | Admitting: *Deleted

## 2015-11-09 ENCOUNTER — Encounter: Payer: Self-pay | Admitting: Oncology

## 2015-11-09 ENCOUNTER — Ambulatory Visit (HOSPITAL_BASED_OUTPATIENT_CLINIC_OR_DEPARTMENT_OTHER): Payer: Medicare Other

## 2015-11-09 ENCOUNTER — Other Ambulatory Visit: Payer: Self-pay | Admitting: Oncology

## 2015-11-09 ENCOUNTER — Telehealth: Payer: Self-pay | Admitting: Oncology

## 2015-11-09 ENCOUNTER — Ambulatory Visit (HOSPITAL_BASED_OUTPATIENT_CLINIC_OR_DEPARTMENT_OTHER): Payer: Medicare Other | Admitting: Oncology

## 2015-11-09 VITALS — BP 147/67 | HR 80 | Temp 98.5°F | Resp 18 | Ht 63.0 in | Wt 170.0 lb

## 2015-11-09 DIAGNOSIS — C782 Secondary malignant neoplasm of pleura: Principal | ICD-10-CM

## 2015-11-09 DIAGNOSIS — R350 Frequency of micturition: Secondary | ICD-10-CM | POA: Diagnosis not present

## 2015-11-09 DIAGNOSIS — C50911 Malignant neoplasm of unspecified site of right female breast: Secondary | ICD-10-CM

## 2015-11-09 DIAGNOSIS — C50919 Malignant neoplasm of unspecified site of unspecified female breast: Secondary | ICD-10-CM

## 2015-11-09 DIAGNOSIS — C7951 Secondary malignant neoplasm of bone: Secondary | ICD-10-CM | POA: Diagnosis not present

## 2015-11-09 DIAGNOSIS — Z79899 Other long term (current) drug therapy: Secondary | ICD-10-CM

## 2015-11-09 DIAGNOSIS — M4854XS Collapsed vertebra, not elsewhere classified, thoracic region, sequela of fracture: Secondary | ICD-10-CM

## 2015-11-09 DIAGNOSIS — R634 Abnormal weight loss: Secondary | ICD-10-CM

## 2015-11-09 DIAGNOSIS — N390 Urinary tract infection, site not specified: Secondary | ICD-10-CM

## 2015-11-09 LAB — CBC WITH DIFFERENTIAL/PLATELET
BASO%: 0.5 % (ref 0.0–2.0)
BASOS ABS: 0 10*3/uL (ref 0.0–0.1)
EOS ABS: 0.4 10*3/uL (ref 0.0–0.5)
EOS%: 6 % (ref 0.0–7.0)
HCT: 40 % (ref 34.8–46.6)
HEMOGLOBIN: 13.6 g/dL (ref 11.6–15.9)
LYMPH%: 17.2 % (ref 14.0–49.7)
MCH: 28.6 pg (ref 25.1–34.0)
MCHC: 34 g/dL (ref 31.5–36.0)
MCV: 84 fL (ref 79.5–101.0)
MONO#: 0.7 10*3/uL (ref 0.1–0.9)
MONO%: 10.8 % (ref 0.0–14.0)
NEUT#: 4.4 10*3/uL (ref 1.5–6.5)
NEUT%: 65.5 % (ref 38.4–76.8)
Platelets: 230 10*3/uL (ref 145–400)
RBC: 4.76 10*6/uL (ref 3.70–5.45)
RDW: 14.4 % (ref 11.2–14.5)
WBC: 6.6 10*3/uL (ref 3.9–10.3)
lymph#: 1.1 10*3/uL (ref 0.9–3.3)

## 2015-11-09 LAB — COMPREHENSIVE METABOLIC PANEL
ALBUMIN: 3.7 g/dL (ref 3.5–5.0)
ALK PHOS: 86 U/L (ref 40–150)
ALT: 17 U/L (ref 0–55)
AST: 26 U/L (ref 5–34)
Anion Gap: 11 mEq/L (ref 3–11)
BILIRUBIN TOTAL: 0.83 mg/dL (ref 0.20–1.20)
BUN: 14.7 mg/dL (ref 7.0–26.0)
CO2: 21 meq/L — AB (ref 22–29)
Calcium: 10.6 mg/dL — ABNORMAL HIGH (ref 8.4–10.4)
Chloride: 110 mEq/L — ABNORMAL HIGH (ref 98–109)
Creatinine: 0.9 mg/dL (ref 0.6–1.1)
EGFR: 61 mL/min/{1.73_m2} — ABNORMAL LOW (ref >90)
GLUCOSE: 99 mg/dL (ref 70–140)
Potassium: 3.6 mEq/L (ref 3.5–5.1)
SODIUM: 141 meq/L (ref 136–145)
TOTAL PROTEIN: 7.4 g/dL (ref 6.4–8.3)

## 2015-11-09 LAB — URINALYSIS, MICROSCOPIC - CHCC
BILIRUBIN (URINE): NEGATIVE
Glucose: NEGATIVE mg/dL
Ketones: NEGATIVE mg/dL
Nitrite: NEGATIVE
PROTEIN: 30 mg/dL
SPECIFIC GRAVITY, URINE: 1.02 (ref 1.003–1.035)
UROBILINOGEN UR: 0.2 mg/dL (ref 0.2–1)
pH: 6 (ref 4.6–8.0)

## 2015-11-09 MED ORDER — ZOLEDRONIC ACID 4 MG/100ML IV SOLN
4.0000 mg | Freq: Once | INTRAVENOUS | Status: AC
  Administered 2015-11-09: 4 mg via INTRAVENOUS
  Filled 2015-11-09: qty 100

## 2015-11-09 MED ORDER — ONDANSETRON HCL 8 MG PO TABS: 8.0000 mg | ORAL_TABLET | Freq: Three times a day (TID) | ORAL | Status: DC | PRN

## 2015-11-09 MED ORDER — SODIUM CHLORIDE 0.9 % IV SOLN
Freq: Once | INTRAVENOUS | Status: AC
  Administered 2015-11-09: 12:00:00 via INTRAVENOUS

## 2015-11-09 NOTE — Telephone Encounter (Signed)
[facility] radiology to [facility] ct scan

## 2015-11-09 NOTE — Progress Notes (Signed)
OFFICE PROGRESS NOTE   November 10, 2015   Physicians: J.Kinard, [name], R.Donneta Romberg, A.Kendall. 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 She has recovered well overall from T6 - L1 partial laminectomy by Dr Christella Noa 09-01-15 for pathologic compression of T10 with retropulsion of bone. She had follow up with Dr Sondra Come on 10-19-15, completion of last 3-4 radiation treatments to thoracic area not necessary now.   Patient is anxious and generally not feeling as well today, tho history is not very specific. She is somewhat SOB with exertion such as walking into office, no cough, no actual chest pain. She feels tired and generally weak. She had diarrhea x 1 after returning home from zometa treatment on 10-16-15, none since; diarrhea is reported rarely with zometa. She feels generally achy at times including various joint pains, cold not shaking chills, denies fever or excessive thirst. She drove herself to office today, tho family member available to drive her home if needed after the zometa. She has not taken oral iron in a month. She stopped drinking Ensure as she was concerned about calcium content. She notices some hair loss, difficulty falling asleep if she wakens at night, some nausea. Denies bleeding, LE swelling, specific new or different pain, specific symptoms of infection. Environmental allergies do not seem bothersome. She is upset re multiple physician visits. Remainder of 10 point Review of Systems unchanged  She is to have labs at Dr Shon Baton office on 4-21 and to see him for physical on 11-16-15. I have spoken directly with Dr Philip Aspen now, will add the extra labs today to save her a trip to his office for lab work on 4-21 (thank you). She is not fasting, ok with Dr  Philip Aspen for the lipids today regardless.  No PAC Declined flu vaccine No genetics testing.  ONCOLOGIC HISTORY The right breast carcinoma initially was T2 N1, ER/PR-positive in December 1998, treated with mastectomy with 12 node axillary evaluation and adjuvant Adriamycin/Cytoxan followed by 5 years of tamoxifen through December 2003. 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 CA 2729 was 434 in April 2010. She has tolerated Femara well and clinically has continued to do very well, though CA 27.29 has never normalized. 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 Patient seen by medical oncology 08-10-15 and radiation begun shortly afterwards by Dr Sondra Come, 22.5 of planned 35 Gy radiation to area of T10 thru 08-29-15. MRI T spine obtained after some difficulty on 08-29-15, with pathologic compression fracture T10 with retropulsion of compressed vertebra to right with compression of cord called, involvement also T9, T11, L2. She had thoracic six- lumbar one posterolateral arthrodesis, T10 laminectomy, partial laminectomy T9, T11 by Dr Christella Noa on 09-01-15. She was begun on Aromasin on 09-07-15.  PET had been scheduled for 09-04-15, delayed with surgery, accomplished on 09-20-15 with chest adenopathy, increased soft tissue right lung base, extensive bone involvement.    Objective:  Vital signs in last 24 hours:  BP 147/67 mmHg  Pulse 80  Temp(Src) 98.5 F (36.9 C) (Oral)  Resp 18  Ht 5' 3"  (1.6 m)  Wt 170 lb (77.111 kg)  BMI 30.12 kg/m2  SpO2 100% Weight down 7 lbs from 3-25 and down 1 lb from NP visit 10-30-15. Respirations not labored seated in exam room, no cough. Alert, oriented, somewhat more anxious today. Ambulatory with cane, easily mobile for exam.  No alopecia  HEENT:PERRL, sclerae not icteric. Oral  mucosa moist without lesions, posterior pharynx clear.  Neck supple. No JVD.  Lymphatics:no cervical,supraclavicular adenopathy Resp: Diminished BS lower 1/3 right, otherwise clear to auscultation bilaterally. No use of accessory muscles. Cardio: regular rate and rhythm. No gallop. GI: soft, nontender, not distended, no mass or organomegaly. Normally active bowel sounds.  Musculoskeletal/ Extremities: without pitting edema, cords, tenderness Neuro: no peripheral neuropathy. Moves all extremities equally. Speech fluent and appropriate. No focal deficits.  Skin without rash, ecchymosis, petechiae   Lab Results:  Results for orders placed or performed in visit on 11/09/15  CBC with Differential  Result Value Ref Range   WBC 6.6 3.9 - 10.3 10e3/uL   NEUT# 4.4 1.5 - 6.5 10e3/uL   HGB 13.6 11.6 - 15.9 g/dL   HCT 40.0 34.8 - 46.6 %   Platelets 230 145 - 400 10e3/uL   MCV 84.0 79.5 - 101.0 fL   MCH 28.6 25.1 - 34.0 pg   MCHC 34.0 31.5 - 36.0 g/dL   RBC 4.76 3.70 - 5.45 10e6/uL   RDW 14.4 11.2 - 14.5 %   lymph# 1.1 0.9 - 3.3 10e3/uL   MONO# 0.7 0.1 - 0.9 10e3/uL   Eosinophils Absolute 0.4 0.0 - 0.5 10e3/uL   Basophils Absolute 0.0 0.0 - 0.1 10e3/uL   NEUT% 65.5 38.4 - 76.8 %   LYMPH% 17.2 14.0 - 49.7 %   MONO% 10.8 0.0 - 14.0 %   EOS% 6.0 0.0 - 7.0 %   BASO% 0.5 0.0 - 2.0 %  CA 27.29  Result Value Ref Range   CA 27.29 212.1 (H) 0.0 - 38.6 U/mL  Comprehensive metabolic panel  Result Value Ref Range   Sodium 141 136 - 145 mEq/L   Potassium 3.6 3.5 - 5.1 mEq/L   Chloride 110 (H) 98 - 109 mEq/L   CO2 21 (L) 22 - 29 mEq/L   Glucose 99 70 - 140 mg/dl   BUN 14.7 7.0 - 26.0 mg/dL   Creatinine 0.9 0.6 - 1.1 mg/dL   Total Bilirubin 0.83 0.20 - 1.20 mg/dL   Alkaline Phosphatase 86 40 - 150 U/L   AST 26 5 - 34 U/L   ALT 17 0 - 55 U/L   Total Protein 7.4 6.4 - 8.3 g/dL   Albumin 3.7 3.5 - 5.0 g/dL   Calcium 10.6 (H) 8.4 - 10.4 mg/dL   Anion Gap 11 3 - 11 mEq/L   EGFR 61 (L) >90  ml/min/1.73 m2  CA 27-29 (Parallel Testing)  Result Value Ref Range   CA 27.29 227 (H) <38 U/mL  Urinalysis with microscopic  Result Value Ref Range   Glucose Negative Negative mg/dL   Bilirubin (Urine) Negative Negative   Ketones Negative Negative mg/dL   Specific Gravity, Urine 1.020 1.003 - 1.035   Blood Moderate Negative   pH 6.0 4.6 - 8.0   Protein 30 Negative- <30 mg/dL   Urobilinogen, UR 0.2 0.2 - 1 mg/dL   Nitrite Negative Negative   Leukocyte Esterase Moderate Negative   RBC / HPF Field Obscured by WBCs 0 - 2   WBC, UA TNTC 0 - 2   Bacteria, UA Many Negative- Trace   Epithelial Cells Occasional Negative- Few  Lipid panel  Result Value Ref Range   Cholesterol, Total 133 100 - 199 mg/dL   Triglycerides 238 (H) 0 - 149 mg/dL   HDL 43 >39 mg/dL   VLDL Cholesterol Cal 48 (H) 5 - 40 mg/dL   LDL Calculated 42 0 - 99 mg/dL   Chol/HDL Ratio 3.1 0.0 - 4.4 ratio units    Urine culture sent after UA resulted above. NOTE urine culture 09-20-15 insignificant growth and 09-04-15 >100k VRE.   NOTE lipids above not fasting  CA 2729 available after visit had been 226 in 07-2015 by same method as the 212 today, and had been 232 in 07-2015 and 262 in 03-2015 as compared with 227 by "parallel testing" today.  Studies/Results:  No results found.  CT chest requested prior to my next visit  Medications: I have reviewed the patient's current medications.  Will begin nitrofurantoin awaiting urine culture result. She has tried to eliminate calcium from meds/ diet, including stopping Ensure as above.   DISCUSSION Patient is concerned that she is not tolerating Aromasin, tho UA available after visit suggests there may be UTI at least contributing to some of these symptoms. She agrees to receiving zometa as planned today given extent of metastatic disease to bone and slight elevation again in calcium; possibly could consider changing zometa to pamidronate if diarrhea again.  Will repeat CT chest  with SOB, as may need to change treatment from Aromasin if progression there (only on Aromasin 2 months now, and can take up to ~ 3 months to see improvement with the hormone blockers).   Did not discuss chemo with her now, had AC adjuvantly. If chemo needed, she may do better with IV at [facility] vs oral xeloda given anxiety about treatment etc.  MD reviewed with pharmacist possible diarrhea with zometa.   Assessment/Plan:   1.progressive metastatic breast cancer now involving right lung base/ pleura, chest adenopathy, and extensive bone involvement: pathologic compression fracture T10 and retropulsion of bone impinging on spinal cord for which she had T6 - L1 posterolateral arthrodesis with T10 laminectomy and T9/ T11 partial laminectomy by Dr Christella Noa on 09-01-15. On exemestane sincre 09-07-15 and IV bisphosphonate (pamidronate in hospital 08-2015 and outpatient zometa). SOB with exertion, will repeat CT chest. Consider pamidronate if diarrhea reoccurs following zometa today. I will see her back after CT as may need to change systemic treatment.  2.post partial course of radiation thru 08-29-15 to T10 region. Per Dr Sondra Come not necessary to complete that course now, prn follow up with him.  3.hypercalcemia and metastatic bone involvement: calcium minimally elevated today, zometa to be given on schedule today. Follow labs. Consider change to pamidronate if diarrhea with this zometa 4.Hormone positive right breast cancer diagnosed 1998 and recurrent to pleura in 2010, post VATS pleurodesis and long stable course on Femara until recent progression. Femara DCd 08-10-15; Aromasin begun ~ 09-06-15. Last imaging was PET 09-20-15.  5.diarrhea Jan treated for C diff ( Ab + toxin -) and resolved.  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 Now back at home with mother, [place] involved.  10.Advance Directives  in place 11.further weight loss likely from DC supplement drink due to concern about extra calcium. I have told her that nutrition is very important and dietary calcium likely not contributing much to elevated calcium now 12. Probable UTI by plain UA now, begin nitrofurantoin awaiting culture result. Vanc resistant enterococcus in  urine 08-2015   All questions answered and we will be in touch with her re possible UTI. Zometa orders confirmed. Time spent 30 min including >50% counseling and coordination of care. CC this note + labs to Dr [name]   Gordy Levan, MD   11/10/2015, 2:16 PM

## 2015-11-09 NOTE — Telephone Encounter (Signed)
appt made and avs printed °

## 2015-11-09 NOTE — Patient Instructions (Signed)

## 2015-11-10 ENCOUNTER — Telehealth: Payer: Self-pay

## 2015-11-10 DIAGNOSIS — Z79899 Other long term (current) drug therapy: Secondary | ICD-10-CM | POA: Insufficient documentation

## 2015-11-10 DIAGNOSIS — R350 Frequency of micturition: Secondary | ICD-10-CM | POA: Insufficient documentation

## 2015-11-10 DIAGNOSIS — N390 Urinary tract infection, site not specified: Secondary | ICD-10-CM

## 2015-11-10 LAB — LIPID PANEL
CHOLESTEROL TOTAL: 133 mg/dL (ref 100–199)
Chol/HDL Ratio: 3.1 ratio units (ref 0.0–4.4)
HDL: 43 mg/dL (ref >39)
LDL CALC: 42 mg/dL (ref 0–99)
Triglycerides: 238 mg/dL — ABNORMAL HIGH (ref 0–149)
VLDL Cholesterol Cal: 48 mg/dL — ABNORMAL HIGH (ref 5–40)

## 2015-11-10 LAB — CANCER ANTIGEN 27-29 (PARALLEL TESTING): CA 27.29: 227 U/mL — ABNORMAL HIGH (ref <38)

## 2015-11-10 LAB — CANCER ANTIGEN 27.29: CAN 27.29: 212.1 U/mL — AB (ref 0.0–38.6)

## 2015-11-10 MED ORDER — NITROFURANTOIN MONOHYD MACRO 100 MG PO CAPS: 100.0000 mg | ORAL_CAPSULE | Freq: Two times a day (BID) | ORAL | Status: DC

## 2015-11-10 NOTE — Telephone Encounter (Signed)
Discussed the results of the U/A from 11-09-15 as noted below by Dr. Marko Plume. Sent prescription in for Macrobid to pt's pharmacy.  Lisa [name] feels good.  No increased urinary  Symptoms. Afebrile. Will follow up on culture results. Sen this note by fax to Dr. Bevelyn Buckles  as requested by Dr. Marko Plume.

## 2015-11-10 NOTE — Telephone Encounter (Signed)
-----   Message from Gordy Levan, MD sent at 11/10/2015  1:24 PM EDT ----- Labs seen and need follow up: UA looks infected, culture still pending. Please start nitrofurantoin either as Macrobid 100 mg bid x 7 days or macrodantin 100 mg q 6 hrs x 7 days (macrobid preferable if insurance covers). Send copy of this note to Dr [name], and will need to send him result of urine cx when available also - he sees her 4-27. Let patient know to call if fever or increased symptoms of UTI this weekend.  thanks

## 2015-11-11 LAB — URINE CULTURE

## 2015-11-13 ENCOUNTER — Telehealth: Payer: Self-pay

## 2015-11-13 NOTE — Telephone Encounter (Signed)
Pt is taking antibiotic as Rx. She did fine after the zometa. She confirmed CT next Monday.

## 2015-11-13 NOTE — Telephone Encounter (Signed)
-----   Message from Gordy Levan, MD sent at 11/11/2015 11:32 AM EDT ----- Labs seen and need follow up:  on 4-24 please let patient know that urine culture does show UTI with E.coli, which is a very common kind of bladder infection and is very sensitive to all antibiotics tested. Be sure she is taking the antibiotic as prescribed   Tell her the information has been sent to Dr Philip Aspen.  Ask how she did after zometa on 4-20  thanks

## 2015-11-20 ENCOUNTER — Encounter (HOSPITAL_COMMUNITY): Payer: Self-pay

## 2015-11-20 ENCOUNTER — Ambulatory Visit (HOSPITAL_COMMUNITY)
Admission: RE | Admit: 2015-11-20 | Discharge: 2015-11-20 | Disposition: A | Discharge status: Home / Self Care | Payer: Medicare Other | Source: Ambulatory Visit | Attending: Oncology | Admitting: Oncology

## 2015-11-20 DIAGNOSIS — C50911 Malignant neoplasm of unspecified site of right female breast: Secondary | ICD-10-CM | POA: Insufficient documentation

## 2015-11-20 DIAGNOSIS — C7951 Secondary malignant neoplasm of bone: Secondary | ICD-10-CM | POA: Diagnosis not present

## 2015-11-20 DIAGNOSIS — R590 Localized enlarged lymph nodes: Secondary | ICD-10-CM | POA: Diagnosis not present

## 2015-11-20 DIAGNOSIS — C782 Secondary malignant neoplasm of pleura: Secondary | ICD-10-CM | POA: Diagnosis present

## 2015-11-20 MED ORDER — IOPAMIDOL (ISOVUE-300) INJECTION 61%
75.0000 mL | Freq: Once | INTRAVENOUS | Status: AC | PRN
Start: 2015-11-20 — End: 2015-11-20
  Administered 2015-11-20: 75 mL via INTRAVENOUS

## 2015-11-22 ENCOUNTER — Other Ambulatory Visit: Payer: Self-pay | Admitting: Oncology

## 2015-11-22 DIAGNOSIS — C7951 Secondary malignant neoplasm of bone: Principal | ICD-10-CM

## 2015-11-22 DIAGNOSIS — C50911 Malignant neoplasm of unspecified site of right female breast: Secondary | ICD-10-CM

## 2015-11-23 ENCOUNTER — Ambulatory Visit (HOSPITAL_BASED_OUTPATIENT_CLINIC_OR_DEPARTMENT_OTHER): Payer: Medicare Other | Admitting: Oncology

## 2015-11-23 ENCOUNTER — Encounter: Payer: Self-pay | Admitting: Oncology

## 2015-11-23 ENCOUNTER — Other Ambulatory Visit (HOSPITAL_BASED_OUTPATIENT_CLINIC_OR_DEPARTMENT_OTHER): Payer: Medicare Other

## 2015-11-23 ENCOUNTER — Telehealth: Payer: Self-pay | Admitting: Oncology

## 2015-11-23 VITALS — BP 162/65 | HR 79 | Temp 98.9°F | Resp 18 | Ht 63.0 in | Wt 169.9 lb

## 2015-11-23 DIAGNOSIS — C7951 Secondary malignant neoplasm of bone: Principal | ICD-10-CM

## 2015-11-23 DIAGNOSIS — C50911 Malignant neoplasm of unspecified site of right female breast: Secondary | ICD-10-CM

## 2015-11-23 DIAGNOSIS — G893 Neoplasm related pain (acute) (chronic): Secondary | ICD-10-CM | POA: Diagnosis not present

## 2015-11-23 DIAGNOSIS — C782 Secondary malignant neoplasm of pleura: Secondary | ICD-10-CM | POA: Diagnosis not present

## 2015-11-23 DIAGNOSIS — C50919 Malignant neoplasm of unspecified site of unspecified female breast: Secondary | ICD-10-CM

## 2015-11-23 LAB — COMPREHENSIVE METABOLIC PANEL
ALBUMIN: 3.7 g/dL (ref 3.5–5.0)
ALK PHOS: 89 U/L (ref 40–150)
ALT: 16 U/L (ref 0–55)
AST: 24 U/L (ref 5–34)
Anion Gap: 8 mEq/L (ref 3–11)
BUN: 14.8 mg/dL (ref 7.0–26.0)
CALCIUM: 11.1 mg/dL — AB (ref 8.4–10.4)
CHLORIDE: 109 meq/L (ref 98–109)
CO2: 23 mEq/L (ref 22–29)
Creatinine: 1 mg/dL (ref 0.6–1.1)
EGFR: 55 mL/min/{1.73_m2} — AB (ref >90)
Glucose: 116 mg/dl (ref 70–140)
POTASSIUM: 3.7 meq/L (ref 3.5–5.1)
SODIUM: 141 meq/L (ref 136–145)
Total Bilirubin: 0.62 mg/dL (ref 0.20–1.20)
Total Protein: 7.2 g/dL (ref 6.4–8.3)

## 2015-11-23 LAB — CBC WITH DIFFERENTIAL/PLATELET
BASO%: 1.3 % (ref 0.0–2.0)
BASOS ABS: 0.1 10*3/uL (ref 0.0–0.1)
EOS ABS: 0.3 10*3/uL (ref 0.0–0.5)
EOS%: 6.1 % (ref 0.0–7.0)
HEMATOCRIT: 40 % (ref 34.8–46.6)
HEMOGLOBIN: 13.3 g/dL (ref 11.6–15.9)
LYMPH%: 22.9 % (ref 14.0–49.7)
MCH: 27.9 pg (ref 25.1–34.0)
MCHC: 33.3 g/dL (ref 31.5–36.0)
MCV: 83.9 fL (ref 79.5–101.0)
MONO#: 0.6 10*3/uL (ref 0.1–0.9)
MONO%: 11 % (ref 0.0–14.0)
NEUT#: 3.1 10*3/uL (ref 1.5–6.5)
NEUT%: 58.7 % (ref 38.4–76.8)
Platelets: 257 10*3/uL (ref 145–400)
RBC: 4.77 10*6/uL (ref 3.70–5.45)
RDW: 15 % — AB (ref 11.2–14.5)
WBC: 5.2 10*3/uL (ref 3.9–10.3)
lymph#: 1.2 10*3/uL (ref 0.9–3.3)

## 2015-11-23 NOTE — Telephone Encounter (Signed)
appt made and avs printed °

## 2015-11-23 NOTE — Progress Notes (Signed)
OFFICE PROGRESS NOTE   Nov 23, 2015   Physicians: J.Kinard, [name], R.Donneta Romberg, A.Kendall. K.Cabbell  INTERVAL HISTORY:  Patient is seen, together with mother, in scheduled follow up of progressive metastatic breast cancer, this involving right pleural area, central chest adenopathy, and extensively in bones. She has been on Aromasin since 09-07-15 and continues monthly zometa with #4 given 11-09-15. She had CT chest 11-20-15, done because of increased SOB, this compared with PET 09-20-15 shows central chest adenopathy and possible increase in lytic area right scapula. She saw Dr Philip Aspen recently, has referred to outpatient PT for gait etc.  Patient overall is clearly better than 3-4 months ago, with better appetite and better PS than when I saw her last on 11-10-15.  She feels that a number of symptoms may be related to the Aromasin, tho not clear that these are not disease related. Most pain is right back "at bra line"; she also has soreness in hips and lateral thighs. No rash seen. She is eating regular diet well, is thirsty at times, bowels generally moving daily. She is not SOB with activity in office now. She denies bleeding or LE swelling. No bladder symptoms, no fever.  She is ambulatory more easily, using walker as balance still not optimal. Losing some hair, which has always been thick. No problems with weakness, diarrhea or fever after zometa 11-09-15 per RN phone call after that treatment.  Remainder of 10 point Review of Systems negative/ unchanged.   No PAC Declined flu vaccine No genetics testing.   ONCOLOGIC HISTORY The right breast carcinoma initially was T2 N1, ER/PR-positive in December 1998, treated with mastectomy with 12 node axillary evaluation and adjuvant Adriamycin/Cytoxan followed by 5 years of tamoxifen through December 2003. The breast cancer was found metastatic to pleura, hormone positive in early 2010, treated with VATS sclerosis by Dr.Burney April 2010 and on  Femara also since April 2010. CA 2729 was 434 in April 2010. She has tolerated Femara well and clinically has continued to do very well, though CA 27.29 has never normalized. 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 Patient seen by medical oncology 08-10-15 and radiation begun shortly afterwards by Dr Sondra Come, 22.5 of planned 35 Gy radiation to area of T10 thru 08-29-15. She had first zometa 08-11-15. MRI T spine obtained after some difficulty on 08-29-15, with pathologic compression fracture T10 with retropulsion of compressed vertebra to right with compression of cord called, involvement also T9, T11, L2. She had thoracic six- lumbar one posterolateral arthrodesis, T10 laminectomy, partial laminectomy T9, T11 by Dr Christella Noa on 09-01-15. She was begun on Aromasin on 09-07-15.  PET had been scheduled for 09-04-15, delayed with surgery, accomplished on 09-20-15 with chest adenopathy, increased soft tissue right lung base, extensive bone involvement.   Objective:  Vital signs in last 24 hours:  BP 162/65 mmHg  Pulse 79  Temp(Src) 98.9 F (37.2 C) (Oral)  Resp 18  Ht '5\' 3"'$  (1.6 m)  Wt 169 lb 14.4 oz (77.066 kg)  BMI 30.10 kg/m2  SpO2 98% Weight stable Alert, oriented and appropriate. Ambulatory using walker. Gets up more easily with no assistance from seated position. Respirations not labored RA.  No alopecia  HEENT:PERRL, sclerae not icteric. Oral mucosa moist without lesions, posterior pharynx clear.  Neck supple. No JVD.  Lymphatics:no cervical,suraclavicular, axillary or inguinal adenopathy Resp: diminished BS and dull to percussion right lower, otherwise clear to auscultation and no dullness to  percussion bilaterally Cardio: regular rate and rhythm. No gallop. GI: soft, nontender, not distended, no mass or organomegaly. Normally active bowel sounds.  Musculoskeletal/ Extremities: LE, UE without pitting edema, cords,  tenderness Surgical incision (T6 - L1) well healed.  Neuro: no peripheral neuropathy. Otherwise nonfocal. PSYCH obviously anxious with discussion of change treatment to chemo Skin without rash including right mid back, no ecchymosis, petechiae   Lab Results:  Results for orders placed or performed in visit on 11/23/15  CBC with Differential  Result Value Ref Range   WBC 5.2 3.9 - 10.3 10e3/uL   NEUT# 3.1 1.5 - 6.5 10e3/uL   HGB 13.3 11.6 - 15.9 g/dL   HCT 40.0 34.8 - 46.6 %   Platelets 257 145 - 400 10e3/uL   MCV 83.9 79.5 - 101.0 fL   MCH 27.9 25.1 - 34.0 pg   MCHC 33.3 31.5 - 36.0 g/dL   RBC 4.77 3.70 - 5.45 10e6/uL   RDW 15.0 (H) 11.2 - 14.5 %   lymph# 1.2 0.9 - 3.3 10e3/uL   MONO# 0.6 0.1 - 0.9 10e3/uL   Eosinophils Absolute 0.3 0.0 - 0.5 10e3/uL   Basophils Absolute 0.1 0.0 - 0.1 10e3/uL   NEUT% 58.7 38.4 - 76.8 %   LYMPH% 22.9 14.0 - 49.7 %   MONO% 11.0 0.0 - 14.0 %   EOS% 6.1 0.0 - 7.0 %   BASO% 1.3 0.0 - 2.0 %  Comprehensive metabolic panel  Result Value Ref Range   Sodium 141 136 - 145 mEq/L   Potassium 3.7 3.5 - 5.1 mEq/L   Chloride 109 98 - 109 mEq/L   CO2 23 22 - 29 mEq/L   Glucose 116 70 - 140 mg/dl   BUN 14.8 7.0 - 26.0 mg/dL   Creatinine 1.0 0.6 - 1.1 mg/dL   Total Bilirubin 0.62 0.20 - 1.20 mg/dL   Alkaline Phosphatase 89 40 - 150 U/L   AST 24 5 - 34 U/L   ALT 16 0 - 55 U/L   Total Protein 7.2 6.4 - 8.3 g/dL   Albumin 3.7 3.5 - 5.0 g/dL   Calcium 11.1 (H) 8.4 - 10.4 mg/dL   Anion Gap 8 3 - 11 mEq/L   EGFR 55 (L) >90 ml/min/1.73 m2    CA 2729 stable or slightly better on 11-09-15 at 212, this having been 226 on 08-10-15. I am not sure this is very helpful marker presently  Studies/Results: CLINICAL DATA: Metastatic right breast cancer  shortness of breath, cough and weight loss.  EXAM: CT CHEST WITH CONTRAST  TECHNIQUE: Multidetector CT imaging of the chest was performed during intravenous contrast administration.  CONTRAST: 35m  ISOVUE-300 IOPAMIDOL (ISOVUE-300) INJECTION 61%  COMPARISON: PET 09/20/2015 not MR thoracic spine 08/29/2015 and CT abdomen pelvis 08/02/2015.  FINDINGS: Mediastinum/Nodes: Right internal jugular lymph nodes measure up to 8 mm on the right, similar. There is ill-defined soft tissue density throughout the mediastinum. Largest defined mediastinal lymph node measures 9 mm in the low right paratracheal station. Right hilar lymph nodes measure up to 1.2 cm. Comparison with 09/20/2015 is difficult due to lack of IV contrast on that exam. Subcarinal lymph node measures 1.5 cm, similar. Surgical clips in the right axilla with mastectomy changes on the right. No internal mammary adenopathy. Heart size normal. No pericardial effusion.  Lungs/Pleura: Patchy calcified consolidation in the right lower lobe is again seen with a small right fibrothorax and associated pleural calcification. Cast subpleural nodularity in the right hemi thorax, similar. Scattered  pulmonary parenchymal scarring.  Upper abdomen: Visualized portions of the liver, adrenal glands, kidneys, spleen, pancreas and stomach are grossly unremarkable.  Musculoskeletal: Postoperative changes are seen in the thoracolumbar spine. Enlarging lytic lesion in the right scapula, measuring 1.4 cm, previously 1.2 cm. T9 and T11 lytic lesions with pathologic compression of T10.  IMPRESSION: 1. Low internal jugular, mediastinal and right hilar adenopathy, indicative of metastatic disease, as on 09/20/2015. 2. Partially calcified consolidation in the right lower lobe with pleural nodularity in the right hemi thorax, as on 09/20/2015. 3. Osseous metastatic disease. Scapular lesion may be minimally larger.   Note right femur Xray 10-12-15 no met by plain film in right femoral neck, faint lesion right iliac wing   Medications: I have reviewed the patient's current medications. She prefers not to use the tramadol if she can tolerate  discomfort, used only tylenol x1 yesterday.  DISCUSSION CT chest information discussed in general terms. I have told patient that I am encouraged that her appetite and activity level have improved, but we need to be sure that present aromasin is helping adequately for the metastatic breast cancer. Pain in mid back is higher than T10, but is in area of laminectomy. I have mentioned that we may need to consider chemotherapy, either IV (consider weekly taxol) or oral (consider xeloda) soon if not responding well to aromasin. As expected, patient is very reluctant to consider chemo, and elderly mother is somewhat upset by this conversation. I have told them that chemo of this sort would likely be easier from standpoint of side effects than the adriamycin cytoxan that she has for adjuvant treatment 1998, and have reminded them that we have made lots of improvements since she took chemo almost 20 years ago.  I have encouraged her to push po fluids due to slight elevation of calcium again today. I have told her that elevated Ca++ is likely not affected significantly by diet, OK for milk products if those appeal to her. We have discussed side effects of hypercalcemia. She will have zometa at least as scheduled 12-07-15.   Assessment/Plan:  1.progressive metastatic breast cancer now involving right lung base/ pleura, chest adenopathy, and extensive bone involvement: pathologic compression fracture T10 and retropulsion of bone impinging on spinal cord for which she had T6 - L1 posterolateral arthrodesis with T10 laminectomy and T9/ T11 partial laminectomy by Dr Christella Noa on 09-01-15. On exemestane sincre 09-07-15 and IV bisphosphonate (pamidronate in hospital 08-2015 and outpatient zometa). SOB with exertion, will repeat CT chest. May need to change systemic treatment from hormonal blocker to chemo (consider weekly taxol vs xeloda), however she does look best overall today that I have seen her and probably will do best with  some time to consider chemo. I will see her on 5-18 with zometa. Will get plain xrays of bilateral hips/ femurs if pain persists there.  2.post partial course of radiation thru 08-29-15 to T10 region. Per Dr Sondra Come not necessary to complete that course now, prn follow up with him.  3.hypercalcemia and metastatic bone involvement: calcium minimally elevated today following zometa 11-09-15. Push po fluids.  Follow labs. Consider change to pamidronate if diarrhea with zometa 4.Hormone positive right breast cancer diagnosed 1998 and recurrent to pleura in 2010, post VATS pleurodesis and long stable course on Femara until recent progression. Femara DCd 08-10-15; Aromasin begun ~ 09-06-15. Last imaging was PET 09-20-15 and CT chest 11-20-15. 5.diarrhea Jan treated for C diff ( Ab + toxin -) and resolved.  6.refused flu  vaccine 7.remote past tobacco, none since 1998 8.social situation: has been primary caregiver for 72 yo mother at home until present problems. Now back at home with mother, [place] involved.  10.Advance Directives in place 11.further weight loss likely from DC supplement drink due to concern about extra calcium. I have told her that nutrition is very important and dietary calcium likely not contributing much to elevated calcium now 12.E.coli UTI >100,000 on 11-09-15 treated with nitrofurantion x 7 days. No symptoms now. (VRE without symptoms 08-2015).  All questions answered. Zometa orders confirmed for 12-07-15. Message to RN to follow up next week, will add plain films bilateral hips and femurs prior to my visit on 5-18 if that discomfort continues. Time spent 25 min including >50% counseling and coordination of care. Route Dr Philip Aspen.     Gordy Levan, MD   11/23/2015, 6:40 PM

## 2015-11-25 ENCOUNTER — Other Ambulatory Visit: Payer: Self-pay | Admitting: Oncology

## 2015-11-29 ENCOUNTER — Telehealth: Payer: Self-pay

## 2015-11-29 NOTE — Telephone Encounter (Signed)
-----   Message from Gordy Levan, MD sent at 11/25/2015 12:28 PM EDT ----- Please follow up by phone week of 11-28-15 If still noticing pain bilateral hips/ lateral thighs, I want plain xrays prior to my next visit (12-07-15) - in time for me to know results prior to visit would be best If so, please order:  "metastatic breast ca to bone with pain" DG hip right W WO pelvis DG hip left W WO pelvis DG femur right 1 view DG femur left 1 view  Please copy this note into EMR chart when done thanks

## 2015-11-29 NOTE — Telephone Encounter (Signed)
S/w pt and her hips and more her lateral thighs are still hurting. Explained that Dr Edwyna Shell wants some plain x-rays on hips. Pt did not want to get xrays. She states she had xray and CT recently. The xray was 3/23 of R femur and CT was 5/1 of chest. She states SE of examestane include joints pains and nausea. She states her R leg is 9/16" shorter than L let and she wears a lift in her shoe. She stated she gets pains in various places with weather changes. She stated she is more concerned with the lesions in R scapula and T spine, and that they may worsen. She explained that today she was nauseated before she took her examestane, she does use the zofran and that makes her itch.  This nurse did not order x-rays and will let Dr Edwyna Shell know of this conversation. She confirmed her 5/18 appt with lab/MD/infusion.

## 2015-12-03 ENCOUNTER — Other Ambulatory Visit: Payer: Self-pay | Admitting: Oncology

## 2015-12-03 DIAGNOSIS — C50919 Malignant neoplasm of unspecified site of unspecified female breast: Secondary | ICD-10-CM

## 2015-12-07 ENCOUNTER — Encounter: Payer: Self-pay | Admitting: Oncology

## 2015-12-07 ENCOUNTER — Telehealth: Payer: Self-pay | Admitting: Oncology

## 2015-12-07 ENCOUNTER — Ambulatory Visit (HOSPITAL_BASED_OUTPATIENT_CLINIC_OR_DEPARTMENT_OTHER): Payer: Medicare Other

## 2015-12-07 ENCOUNTER — Ambulatory Visit (HOSPITAL_BASED_OUTPATIENT_CLINIC_OR_DEPARTMENT_OTHER): Payer: Medicare Other | Admitting: Oncology

## 2015-12-07 ENCOUNTER — Other Ambulatory Visit (HOSPITAL_BASED_OUTPATIENT_CLINIC_OR_DEPARTMENT_OTHER): Payer: Medicare Other

## 2015-12-07 VITALS — BP 146/90 | HR 82 | Temp 98.6°F | Resp 18 | Ht 63.0 in | Wt 167.4 lb

## 2015-12-07 DIAGNOSIS — C7801 Secondary malignant neoplasm of right lung: Secondary | ICD-10-CM

## 2015-12-07 DIAGNOSIS — C50911 Malignant neoplasm of unspecified site of right female breast: Secondary | ICD-10-CM

## 2015-12-07 DIAGNOSIS — G893 Neoplasm related pain (acute) (chronic): Secondary | ICD-10-CM

## 2015-12-07 DIAGNOSIS — C50919 Malignant neoplasm of unspecified site of unspecified female breast: Secondary | ICD-10-CM

## 2015-12-07 DIAGNOSIS — C7951 Secondary malignant neoplasm of bone: Secondary | ICD-10-CM

## 2015-12-07 DIAGNOSIS — C782 Secondary malignant neoplasm of pleura: Secondary | ICD-10-CM

## 2015-12-07 LAB — CBC WITH DIFFERENTIAL/PLATELET
BASO%: 1.3 % (ref 0.0–2.0)
Basophils Absolute: 0.1 10*3/uL (ref 0.0–0.1)
EOS%: 5.6 % (ref 0.0–7.0)
Eosinophils Absolute: 0.3 10*3/uL (ref 0.0–0.5)
HCT: 40.1 % (ref 34.8–46.6)
HGB: 13.5 g/dL (ref 11.6–15.9)
LYMPH#: 1.2 10*3/uL (ref 0.9–3.3)
LYMPH%: 23 % (ref 14.0–49.7)
MCH: 27.9 pg (ref 25.1–34.0)
MCHC: 33.6 g/dL (ref 31.5–36.0)
MCV: 82.9 fL (ref 79.5–101.0)
MONO#: 0.7 10*3/uL (ref 0.1–0.9)
MONO%: 13 % (ref 0.0–14.0)
NEUT#: 2.9 10*3/uL (ref 1.5–6.5)
NEUT%: 57.1 % (ref 38.4–76.8)
Platelets: 227 10*3/uL (ref 145–400)
RBC: 4.83 10*6/uL (ref 3.70–5.45)
RDW: 15 % — ABNORMAL HIGH (ref 11.2–14.5)
WBC: 5.1 10*3/uL (ref 3.9–10.3)

## 2015-12-07 LAB — COMPREHENSIVE METABOLIC PANEL
ALT: 18 U/L (ref 0–55)
AST: 25 U/L (ref 5–34)
Albumin: 3.8 g/dL (ref 3.5–5.0)
Alkaline Phosphatase: 86 U/L (ref 40–150)
Anion Gap: 5 mEq/L (ref 3–11)
BUN: 13.4 mg/dL (ref 7.0–26.0)
CHLORIDE: 112 meq/L — AB (ref 98–109)
CO2: 22 meq/L (ref 22–29)
CREATININE: 0.8 mg/dL (ref 0.6–1.1)
Calcium: 11 mg/dL — ABNORMAL HIGH (ref 8.4–10.4)
EGFR: 70 mL/min/{1.73_m2} — ABNORMAL LOW (ref >90)
Glucose: 101 mg/dl (ref 70–140)
POTASSIUM: 4.2 meq/L (ref 3.5–5.1)
SODIUM: 139 meq/L (ref 136–145)
Total Bilirubin: 0.66 mg/dL (ref 0.20–1.20)
Total Protein: 7.2 g/dL (ref 6.4–8.3)

## 2015-12-07 MED ORDER — ZOLEDRONIC ACID 4 MG/100ML IV SOLN
4.0000 mg | Freq: Once | INTRAVENOUS | Status: AC
  Administered 2015-12-07: 4 mg via INTRAVENOUS
  Filled 2015-12-07: qty 100

## 2015-12-07 MED ORDER — SODIUM CHLORIDE 0.9 % IJ SOLN
10.0000 mL | INTRAMUSCULAR | Status: DC | PRN
  Filled 2015-12-07: qty 10

## 2015-12-07 MED ORDER — HEPARIN SOD (PORK) LOCK FLUSH 100 UNIT/ML IV SOLN
500.0000 [IU] | Freq: Once | INTRAVENOUS | Status: DC | PRN
  Filled 2015-12-07: qty 5

## 2015-12-07 MED ORDER — SODIUM CHLORIDE 0.9 % IV SOLN
Freq: Once | INTRAVENOUS | Status: AC
  Administered 2015-12-07: 13:00:00 via INTRAVENOUS

## 2015-12-07 NOTE — Telephone Encounter (Signed)
appt made and avs printed °

## 2015-12-07 NOTE — Progress Notes (Signed)
OFFICE PROGRESS NOTE   Dec 09, 2015   Physicians: J.Kinard, Jarome Matin, R.Lipscomb Callas, A.Kendall. K.Cabbell  INTERVAL HISTORY:  Patient is seen, together with mother, in continuing attention to metastatic breast cancer involving right lower chest/ pleura/ adenopathy and extensively involving bones. She had long response to Femara and has now been on Aromasin since 09-07-15, as well as monthly zometa since 08-11-15, due today. She had T6 - L1 laminectomy on 09-01-15 for pathologic compression T10. Last imaging:  PET 09-20-15,  CT chest 11-20-15, and xray right femur 10-12-15.   Patient is clearly improved over 3-4 months ago, tho is mildly hypercalcemic today, continues with soreness right back below scapula and neuropathic type pain lateral thighs as well as discomfort upper right thigh. She is easily ambulatory with rolling walker and easily able to stand from sitting position. She has begun outpatient physical therapy for gaitShe rarely uses any pain medication, one tramadol in last week and some tylenol. She is gradually more active at home, able to make up 2 beds. She has occasional cough, 2-3x yesterday and none today, described as "hard" and NP. She notices SOB only with increased exertion. She is drinking four 16 oz bottles of water daily . She is up to void 2-4x per night, no dysuria. Appetite generally good, no nausea, no fever, no bleeding. No LE swelling, bowels ok.  She had diarrhea after ~ #3 zometa, tho tolerated this without difficulty at treatment 11-09-15. She does not tolerate gatorade, which causes diarrhea.  Remainder of 10 point Review of Systems negative/ unchanged.    No PAC Declined flu vaccine No genetics testing.  PT dates 5-24,26,30,6-1,6,8,13,15    Most at 3 PM  ONCOLOGIC HISTORY The right breast carcinoma initially was T2 N1, ER/PR-positive in December 1998, treated with mastectomy with 12 node axillary evaluation and adjuvant Adriamycin/Cytoxan followed by 5 years of  tamoxifen through December 2003. 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 CA 2729 was 434 in April 2010. She has tolerated Femara well and clinically has continued to do very well, though CA 27.29 has never normalized. 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 Patient seen by medical oncology 08-10-15 and radiation begun shortly afterwards by Dr 03-23-2001, 22.5 of planned 35 Gy radiation to area of T10 thru 08-29-15. She had first zometa 08-11-15. MRI T spine obtained after some difficulty on 08-29-15, with pathologic compression fracture T10 with retropulsion of compressed vertebra to right with compression of cord called, involvement also T9, T11, L2. She had thoracic six- lumbar one posterolateral arthrodesis, T10 laminectomy, partial laminectomy T9, T11 by Dr 02-04-1972 on 09-01-15. She was begun on Aromasin on 09-07-15.  PET had been scheduled for 09-04-15, delayed with surgery, accomplished on 09-20-15 with chest adenopathy, increased soft tissue right lung base, extensive bone involvement.   Objective:  Vital signs in last 24 hours:  BP 146/90 mmHg  Pulse 82  Temp(Src) 98.6 F (37 C) (Oral)  Resp 18  Ht 5\' 3"  (1.6 m)  Wt 167 lb 6.4 oz (75.932 kg)  BMI 29.66 kg/m2  SpO2 100% Weight doen 2 lbs Alert, oriented and appropriate. Stands from chair with no assistance, ambulates without walker and more easily with walker. Respirations not labored RA with activity in exam room, no cough during all of visit. No alopecia  HEENT:PERRL, sclerae not  icteric. Oral mucosa moist without lesions, posterior pharynx clear.  Neck supple. No JVD.  Lymphatics:no cervical,supraclavicular, axillary adenopathy Resp: absent BS right lower lung posteriorly, no wheezes or crackles, otherwise clear to auscultation bilaterally and  normal percussion bilaterally Cardio: regular rate and rhythm. No gallop. GI: soft, nontender, not distended, no mass or organomegaly. Normally active bowel sounds. Surgical incision not remarkable. Musculoskeletal/ Extremities: LE/ [place]  without pitting edema, cords, tenderness. No tenderness to palpation right scapula or where she describes pain, which is ~ T7 or T8 right back. Not tender to palpation right upper femur.  Neuro: speech fluent, CN intact, moves all extremities Skin without rash, ecchymosis, petechiae. Laminectomy incision well healed.    Lab Results:  Results for orders placed or performed in visit on 12/07/15  CBC with Differential  Result Value Ref Range   WBC 5.1 3.9 - 10.3 10e3/uL   NEUT# 2.9 1.5 - 6.5 10e3/uL   HGB 13.5 11.6 - 15.9 g/dL   HCT 40.1 34.8 - 46.6 %   Platelets 227 145 - 400 10e3/uL   MCV 82.9 79.5 - 101.0 fL   MCH 27.9 25.1 - 34.0 pg   MCHC 33.6 31.5 - 36.0 g/dL   RBC 4.83 3.70 - 5.45 10e6/uL   RDW 15.0 (H) 11.2 - 14.5 %   lymph# 1.2 0.9 - 3.3 10e3/uL   MONO# 0.7 0.1 - 0.9 10e3/uL   Eosinophils Absolute 0.3 0.0 - 0.5 10e3/uL   Basophils Absolute 0.1 0.0 - 0.1 10e3/uL   NEUT% 57.1 38.4 - 76.8 %   LYMPH% 23.0 14.0 - 49.7 %   MONO% 13.0 0.0 - 14.0 %   EOS% 5.6 0.0 - 7.0 %   BASO% 1.3 0.0 - 2.0 %  Comprehensive metabolic panel  Result Value Ref Range   Sodium 139 136 - 145 mEq/L   Potassium 4.2 3.5 - 5.1 mEq/L   Chloride 112 (H) 98 - 109 mEq/L   CO2 22 22 - 29 mEq/L   Glucose 101 70 - 140 mg/dl   BUN 13.4 7.0 - 26.0 mg/dL   Creatinine 0.8 0.6 - 1.1 mg/dL   Total Bilirubin 0.66 0.20 - 1.20 mg/dL   Alkaline Phosphatase 86 40 - 150 U/L   AST 25 5 - 34 U/L   ALT 18 0 - 55 U/L   Total Protein 7.2 6.4 - 8.3 g/dL   Albumin 3.8 3.5 - 5.0 g/dL   Calcium 11.0 (H) 8.4 - 10.4 mg/dL   Anion Gap 5 3 - 11 mEq/L   EGFR 70 (L) >90 ml/min/1.73 m2  CA 15.3  Result Value Ref Range   CA 15-3 240.9 (H) 0.0 - 25.0 U/mL  CA 27.29  Result Value Ref Range    CA 27.29 217.2 (H) 0.0 - 38.6 U/mL    Markers were available after visit. CA 15.3 had been 251 on 10-12-15 and 245 on 08-24-15  CA 2729 had been 212 on 11-09-15 (by new lab method, compared with 227 by prior lab method),                                 226 on 08-10-15 (by new lab method, compared with 232 by prior lab method)                                 Also 262 by prior method 03-2015,  256 in 10-2014.  Studies/Results:  No results found.  Patient refused repeat Xrays after last visit  Medications: I have reviewed the patient's current medications.   DISCUSSION  I have told patient that while she is clinically stronger and less uncomfortable than in 07-2015, it is difficult to tell if she is having adequate response to present Aromasin, which she has now taken for enough time to allow benefit. I have told her that goal is to improve and control the metastatic disease if possible, even if that means trying different interventions, obviously if she is in agreement. I have mentioned the area seen on PET right scapula and right femoral neck (could not be seen in femoral neck on plain xray 10-12-15); note also rib involvement on that PET. As previously, she and mother are very anxious and not very open to this discussion. I have told her that we have lots of options for other treatment, which I expect she would be able to tolerate.  We have decided that I will let her know results of the markers and we will make decisions then re additional imaging. I have explained that new or persistant symptoms may need other imaging, even with what she has had done in last few months.   I have encouraged her to try the outpatient PT; we will work around the PT schedule if needed.  We have discussed calcium value and pain meds.   Assessment/Plan:  1.progressive metastatic breast cancer now involving right lung base/ pleura, central chest adenopathy, and extensive bone involvement: pathologic compression fracture  T10 and retropulsion of bone impinging on spinal cord for which she had T6 - L1 posterolateral arthrodesis with T10 laminectomy and T9/ T11 partial laminectomy by Dr Christella Noa on 09-01-15. On exemestane since 09-07-15 and IV bisphosphonate.  May need to change systemic treatment from hormonal blocker to chemo (consider weekly taxol vs xeloda); I will discuss with patient by phone with information re no real improvement in markers as above, and will recommend repeat PET, as this will give [place] best information for all of bone areas as well as intrathoracic disease. If possible will get PET in 2-3 weeks and I will see her after that. She will continue Aromasin until PET information and continue monthly zometa now.   Initial diagnosis hormone + right breast CA 1998, recurrent to pleura 2010, VATS pleurodesis and long control on Femara until late 2016.   2.post partial course of radiation thru 08-29-15 to T10 region. Prn follow up Dr Sondra Come 3.hypercalcemia and metastatic bone involvement: calcium minimally elevated today.  Consider change to pamidronate if diarrhea with zometa. Discussed other electrolyte supplements around zometa since she cannot tolerate gatorade. 4.E coli UTI treated 10-2015, asymptomatic now 5.diarrhea Jan treated for C diff ( Ab + toxin -) and resolved.  6.refused flu vaccine 7.remote past tobacco, none since 1998 8.social situation: has been primary caregiver for 73 yo. Now back at home with mother. Beginning outpatient PT for gait and core strengthening, per Dr Philip Aspen.  10.Advance Directives in place 11. Some weight loss ongoing, encouraged good po intake.   Questions elicited and patient/ mother seem in agreement with recommendations and plans for now. Zometa orders confirmed. Time spent 25 min including >50% counseling and coordination of care. Route PCP    Senita Corredor P, MD   12/09/2015, 11:02 AM

## 2015-12-07 NOTE — Patient Instructions (Signed)

## 2015-12-08 LAB — CANCER ANTIGEN 15-3: CA 15-3: 240.9 U/mL — ABNORMAL HIGH (ref 0.0–25.0)

## 2015-12-08 LAB — CANCER ANTIGEN 27.29: CAN 27.29: 217.2 U/mL — AB (ref 0.0–38.6)

## 2015-12-09 ENCOUNTER — Other Ambulatory Visit: Payer: Self-pay | Admitting: Oncology

## 2015-12-09 ENCOUNTER — Telehealth: Payer: Self-pay | Admitting: Oncology

## 2015-12-09 DIAGNOSIS — C50911 Malignant neoplasm of unspecified site of right female breast: Secondary | ICD-10-CM

## 2015-12-09 DIAGNOSIS — C50919 Malignant neoplasm of unspecified site of unspecified female breast: Secondary | ICD-10-CM | POA: Insufficient documentation

## 2015-12-09 DIAGNOSIS — C7951 Secondary malignant neoplasm of bone: Secondary | ICD-10-CM

## 2015-12-09 DIAGNOSIS — C782 Secondary malignant neoplasm of pleura: Secondary | ICD-10-CM

## 2015-12-09 NOTE — Telephone Encounter (Signed)
MEDICAL ONCOLOGY  Spoke directly to patient by phone now. (Patient across street from home when I called, came quickly and did not sound SOB when talking, no cough).   Told patient that tumor markers are stable, but not improved. Explained again findings on PET early March, including bone involvement in spine, ribs, right scapula, right femoral neck. Discussed pain right back spine vs ribs, and right hip.  She understands that we need to be sure that present treatment is helping adequately. I have told her again that there are other options for treatment if this needs to change. She is in agreement with repeating PET if insurance will approve, as this will be most definitive reevaluation now. All questions answered. She will continue exemestane at least until restaging information available.  She did well with zometa 12-07-15, no diarrhea or other problems.   Patient expressed appreciation for call, and appreciation for nursing staff in infusion area.  PET order placed, [facility] managed care notified. Patient aware that she will be contacted with PET appointment.  Godfrey Pick, MD

## 2015-12-22 ENCOUNTER — Ambulatory Visit (HOSPITAL_COMMUNITY)
Admission: RE | Admit: 2015-12-22 | Discharge: 2015-12-22 | Disposition: A | Discharge status: Home / Self Care | Payer: Medicare Other | Source: Ambulatory Visit | Attending: Oncology | Admitting: Oncology

## 2015-12-22 DIAGNOSIS — Z981 Arthrodesis status: Secondary | ICD-10-CM | POA: Insufficient documentation

## 2015-12-22 DIAGNOSIS — C7951 Secondary malignant neoplasm of bone: Secondary | ICD-10-CM | POA: Diagnosis not present

## 2015-12-22 DIAGNOSIS — Z9889 Other specified postprocedural states: Secondary | ICD-10-CM | POA: Diagnosis not present

## 2015-12-22 DIAGNOSIS — C50911 Malignant neoplasm of unspecified site of right female breast: Secondary | ICD-10-CM | POA: Diagnosis present

## 2015-12-22 DIAGNOSIS — C782 Secondary malignant neoplasm of pleura: Secondary | ICD-10-CM | POA: Insufficient documentation

## 2015-12-22 LAB — GLUCOSE, CAPILLARY: Glucose-Capillary: 97 mg/dL (ref 65–99)

## 2015-12-22 MED ORDER — FLUDEOXYGLUCOSE F - 18 (FDG) INJECTION
8.9000 | Freq: Once | INTRAVENOUS | Status: AC | PRN
  Administered 2015-12-22: 8.9 via INTRAVENOUS

## 2015-12-31 ENCOUNTER — Other Ambulatory Visit: Payer: Self-pay

## 2015-12-31 ENCOUNTER — Other Ambulatory Visit: Payer: Self-pay | Admitting: Oncology

## 2015-12-31 DIAGNOSIS — C50911 Malignant neoplasm of unspecified site of right female breast: Secondary | ICD-10-CM

## 2016-01-01 ENCOUNTER — Ambulatory Visit (HOSPITAL_BASED_OUTPATIENT_CLINIC_OR_DEPARTMENT_OTHER): Payer: Medicare Other

## 2016-01-01 ENCOUNTER — Telehealth: Payer: Self-pay | Admitting: Oncology

## 2016-01-01 ENCOUNTER — Ambulatory Visit (HOSPITAL_COMMUNITY)
Admission: RE | Admit: 2016-01-01 | Discharge: 2016-01-01 | Disposition: A | Discharge status: Home / Self Care | Payer: Medicare Other | Source: Ambulatory Visit | Attending: Oncology | Admitting: Oncology

## 2016-01-01 ENCOUNTER — Ambulatory Visit (HOSPITAL_BASED_OUTPATIENT_CLINIC_OR_DEPARTMENT_OTHER): Payer: Medicare Other | Admitting: Oncology

## 2016-01-01 ENCOUNTER — Encounter: Payer: Self-pay | Admitting: Oncology

## 2016-01-01 ENCOUNTER — Encounter: Payer: Self-pay | Admitting: Pharmacist

## 2016-01-01 ENCOUNTER — Other Ambulatory Visit (HOSPITAL_BASED_OUTPATIENT_CLINIC_OR_DEPARTMENT_OTHER): Payer: Medicare Other

## 2016-01-01 VITALS — BP 138/79 | HR 77 | Temp 98.8°F | Resp 18 | Ht 63.0 in | Wt 165.2 lb

## 2016-01-01 DIAGNOSIS — C50911 Malignant neoplasm of unspecified site of right female breast: Secondary | ICD-10-CM

## 2016-01-01 DIAGNOSIS — C7951 Secondary malignant neoplasm of bone: Secondary | ICD-10-CM

## 2016-01-01 DIAGNOSIS — M4854XS Collapsed vertebra, not elsewhere classified, thoracic region, sequela of fracture: Secondary | ICD-10-CM

## 2016-01-01 DIAGNOSIS — C782 Secondary malignant neoplasm of pleura: Secondary | ICD-10-CM

## 2016-01-01 DIAGNOSIS — C50919 Malignant neoplasm of unspecified site of unspecified female breast: Secondary | ICD-10-CM

## 2016-01-01 DIAGNOSIS — I10 Essential (primary) hypertension: Secondary | ICD-10-CM

## 2016-01-01 LAB — CBC WITH DIFFERENTIAL/PLATELET
BASO%: 0.5 % (ref 0.0–2.0)
BASOS ABS: 0 10*3/uL (ref 0.0–0.1)
EOS ABS: 0.3 10*3/uL (ref 0.0–0.5)
EOS%: 5.3 % (ref 0.0–7.0)
HEMATOCRIT: 40.6 % (ref 34.8–46.6)
HEMOGLOBIN: 14 g/dL (ref 11.6–15.9)
LYMPH%: 24.1 % (ref 14.0–49.7)
MCH: 29 pg (ref 25.1–34.0)
MCHC: 34.5 g/dL (ref 31.5–36.0)
MCV: 84.1 fL (ref 79.5–101.0)
MONO#: 0.6 10*3/uL (ref 0.1–0.9)
MONO%: 11.2 % (ref 0.0–14.0)
NEUT%: 58.9 % (ref 38.4–76.8)
NEUTROS ABS: 3.4 10*3/uL (ref 1.5–6.5)
NRBC: 0 % (ref 0–0)
PLATELETS: 171 10*3/uL (ref 145–400)
RBC: 4.83 10*6/uL (ref 3.70–5.45)
RDW: 14.8 % — AB (ref 11.2–14.5)
WBC: 5.7 10*3/uL (ref 3.9–10.3)
lymph#: 1.4 10*3/uL (ref 0.9–3.3)

## 2016-01-01 LAB — COMPREHENSIVE METABOLIC PANEL
ALBUMIN: 3.9 g/dL (ref 3.5–5.0)
ALK PHOS: 73 U/L (ref 40–150)
ALT: 24 U/L (ref 0–55)
ANION GAP: 7 meq/L (ref 3–11)
AST: 28 U/L (ref 5–34)
BILIRUBIN TOTAL: 1.02 mg/dL (ref 0.20–1.20)
BUN: 13.8 mg/dL (ref 7.0–26.0)
CALCIUM: 11.4 mg/dL — AB (ref 8.4–10.4)
CO2: 23 meq/L (ref 22–29)
CREATININE: 0.9 mg/dL (ref 0.6–1.1)
Chloride: 111 mEq/L — ABNORMAL HIGH (ref 98–109)
EGFR: 65 mL/min/{1.73_m2} — AB (ref >90)
Glucose: 90 mg/dl (ref 70–140)
Potassium: 4.1 mEq/L (ref 3.5–5.1)
Sodium: 140 mEq/L (ref 136–145)
TOTAL PROTEIN: 7.2 g/dL (ref 6.4–8.3)

## 2016-01-01 MED ORDER — ZOLEDRONIC ACID 4 MG/100ML IV SOLN
4.0000 mg | Freq: Once | INTRAVENOUS | Status: AC
  Administered 2016-01-01: 4 mg via INTRAVENOUS
  Filled 2016-01-01: qty 100

## 2016-01-01 NOTE — Progress Notes (Signed)
Oral Chemotherapy Pharmacist Encounter   I spoke with patient in the infusion room today for overview of new oral chemotherapy medication: Xeloda.  Counseled patient on administration, dosing, side effects, safe handling, and monitoring. Side effects include but not limited to: N/V/D, hand/foot syndrome, mucositis/stomatitis, fever.  She has Zofran if needed for N/V.  She will try to take Xeloda w/o the Zofran but if she has trouble w/ N/V, she knows to take Zofran 30 min before each dose of Xeloda.  She states she generally has a weak stomach.  She also stated she doesn't eat large meals in the evenings.  She will try to have something on her stomach in the evenings once she begins Xeloda.  I also advised her to go to pharmacy and purchase some Imodium and if diarrhea occurs, use as directed on package.  If diarrhea is not controlled, she knows to call our office.   Pt states she usually has constipation.  There is a potential drug interaction w/ Xeloda (strong 2C9 inhibitor) and Losartan (2C9 substrate).  Xeloda may decrease the metabolism of Losartan. Recommended therapy modification to avoid toxicity of Losartan.  Losartan was [id number] by PCP [name], MD w/ [facility].  I reviewed this interaction w/ pt and she stated that she has a "big problem" changing blood pressure meds; it usually takes a long time to adjust.  I provided her 2 handouts on Xeloda.  She voiced understanding and appreciation.   All questions answered.  Dr. Marko Plume needs to write RX and then we can fax over to [facility].  Will follow up with patient regarding insurance ([employer]/Medicare Part D).   Once she begins Xeloda, we will follow up in 1-2 weeks for adherence and toxicity management.   Pt has our contact info for the [facility].  Thank you, Kennith Center, Pharm.D., CPP 01/01/2016@1 :49 PM Oral Chemotherapy Clinic

## 2016-01-01 NOTE — Telephone Encounter (Signed)
appt made and avs printed °

## 2016-01-01 NOTE — Patient Instructions (Signed)

## 2016-01-01 NOTE — Progress Notes (Signed)
OFFICE PROGRESS NOTE   January 02, 2016   Physicians: J.Kinard, [name], R.Donneta Romberg, A.Kendall. K.Cabbell  INTERVAL HISTORY:   Patient is seen, together with mother, in continuing attention to breast cancer involving right chest and extensively involving bone, on Aromasin since 09-07-15 and monthly zometa due today. Course has been complicated by pathologic fracture T10 and hypercalcemia.  Restaging PET on 12-22-15 does not show improvement from the Aromasin. At completion of discussion today, plan is to stop Aromasin and try xeloda.  Patient was unable to tolerate outpatient PT due to generalized pain with that activity; she discontinued the PT after one session. She uses only very occasional tylenol for pain, does have tramadol available,  but did not use either despite symptoms following the PT session. She continues to do some more gentle exercises herself at home, and is ambulatory in the house without walker or cane, tho tells me she is very careful. She has same mild discomfort at midback, unchanged. She has had slight SOB at times, no cough or hemoptysis, some tightness at times right chest "I could tell the cancer is getting worse". She is not excessively thirsty, bowels are moving, not sedated. She is eating and drinks fluids well. She denies LE swelling. No bleeding. No fever or symptoms of infection. She had no problem with zometa on 12-07-15. Remainder of 10 point Review of Systems unchanged.    No PAC Declined flu vaccine No genetics testing.  ONCOLOGIC HISTORY The right breast carcinoma initially was T2 N1, ER/PR-positive in December 1998, treated with mastectomy with 12 node axillary evaluation and adjuvant Adriamycin/Cytoxan followed by 5 years of tamoxifen through December 2003. 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 CA 2729 was 434 in April 2010.  She has tolerated Femara well and clinically has continued to do very well, though CA 27.29 has never normalized. 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 Patient seen by medical oncology 08-10-15 and radiation begun shortly afterwards by Dr Sondra Come, 22.5 of planned 35 Gy radiation to area of T10 thru 08-29-15. She had first zometa 08-11-15. MRI T spine obtained after some difficulty on 08-29-15, with pathologic compression fracture T10 with retropulsion of compressed vertebra to right with compression of cord called, involvement also T9, T11, L2. She had thoracic six- lumbar one posterolateral arthrodesis, T10 laminectomy, partial laminectomy T9, T11 by Dr Christella Noa on 09-01-15. She was begun on Aromasin on 09-07-15.  PET had been scheduled for 09-04-15, delayed with surgery, accomplished on 09-20-15 with chest adenopathy, increased soft tissue right lung base, extensive bone involvement.  Repeat PET 12-22-15 some improvement in area of radiation T spine, otherwise no improvement in bones, some increase right femur, some central chest adenopathy and right lower chest/ pleural involvement.   Objective:  Vital signs in last 24 hours:  BP 138/79 mmHg  Pulse 77  Temp(Src) 98.8 F (37.1 C) (Oral)  Resp 18  Ht _0  (1.6 m)  Wt 165 lb 3.2 oz (74.934 kg)  BMI 29.27 kg/m2  SpO2 98% Weight down 2 lbs. Alert, oriented and appropriate. Ambulatory with and without rolling walker, favors hips. .  No alopecia  HEENT:PERRL, sclerae not icteric. Oral mucosa moist without lesions, posterior pharynx clear.  Neck supple. No JVD.  Lymphatics:no cervical,supraclavicular adenopathy Resp: decreased BS at right base without wheezes or crackles,  otherwise clear to auscultation bilaterally  Cardio: regular rate and rhythm. No gallop. GI: soft, nontender, not distended, no mass or organomegaly. Normally active bowel sounds. Surgical incision not  remarkable. Musculoskeletal/ Extremities: without pitting edema, cords, tenderness Neuro: speech fluent and appropriate, no change motor/ sensory/ cerebellar grossly. PSYCH appropriate mood and affect.  Skin without rash, ecchymosis, petechiae Breasts: without dominant mass, skin or nipple findings. Axillae benign. Portacath-without erythema or tenderness  Lab Results:  Results for orders placed or performed in visit on 01/01/16  CBC with Differential  Result Value Ref Range   WBC 5.7 3.9 - 10.3 10e3/uL   NEUT# 3.4 1.5 - 6.5 10e3/uL   HGB 14.0 11.6 - 15.9 g/dL   HCT 40.6 34.8 - 46.6 %   Platelets 171 145 - 400 10e3/uL   MCV 84.1 79.5 - 101.0 fL   MCH 29.0 25.1 - 34.0 pg   MCHC 34.5 31.5 - 36.0 g/dL   RBC 4.83 3.70 - 5.45 10e6/uL   RDW 14.8 (H) 11.2 - 14.5 %   lymph# 1.4 0.9 - 3.3 10e3/uL   MONO# 0.6 0.1 - 0.9 10e3/uL   Eosinophils Absolute 0.3 0.0 - 0.5 10e3/uL   Basophils Absolute 0.0 0.0 - 0.1 10e3/uL   NEUT% 58.9 38.4 - 76.8 %   LYMPH% 24.1 14.0 - 49.7 %   MONO% 11.2 0.0 - 14.0 %   EOS% 5.3 0.0 - 7.0 %   BASO% 0.5 0.0 - 2.0 %   nRBC 0 0 - 0 %  Comprehensive metabolic panel  Result Value Ref Range   Sodium 140 136 - 145 mEq/L   Potassium 4.1 3.5 - 5.1 mEq/L   Chloride 111 (H) 98 - 109 mEq/L   CO2 23 22 - 29 mEq/L   Glucose 90 70 - 140 mg/dl   BUN 13.8 7.0 - 26.0 mg/dL   Creatinine 0.9 0.6 - 1.1 mg/dL   Total Bilirubin 1.02 0.20 - 1.20 mg/dL   Alkaline Phosphatase 73 40 - 150 U/L   AST 28 5 - 34 U/L   ALT 24 0 - 55 U/L   Total Protein 7.2 6.4 - 8.3 g/dL   Albumin 3.9 3.5 - 5.0 g/dL   Calcium 11.4 (H) 8.4 - 10.4 mg/dL   Anion Gap 7 3 - 11 mEq/L   EGFR 65 (L) >90 ml/min/1.73 m2    CA 2729 on 12-07-15   217, on 11-09-15  212,  on 08-10-15  226 CA 15-3  on 12-07-15  240, on 10-12-15  251,  on 08-24-15  245   Studies/Results:   EXAM: NUCLEAR MEDICINE PET SKULL BASE TO THIGH  12-22-15  COMPARISON: PET-CT 09/20/2015  FINDINGS: NECK  No hypermetabolic cervical  lymph nodes are identified.There are no lesions of the pharyngeal mucosal space.  CHEST  Again demonstrated are multiple small hypermetabolic lymph nodes at the thoracic inlet bilaterally. These are similar to the prior study. SUV max is 12.1 on the right and 11.8 on the left. Multiple hypermetabolic mediastinal and bilateral hilar lymph nodes are also similar to the prior study. Right hilar node has an SUV max of 18.9 (previously 17.9). There is a subcarinal nodal mass with an SUV max of 16.3. There is a persistent hypermetabolic, partially calcified pleural parenchymal mass at the right lung base, measuring 5.8 x 4.4 cm. This has an SUV max of 27.2. There is additional hypermetabolic pleural disease extending into the right-sided fissures which appears similar.  ABDOMEN/PELVIS  There is no hypermetabolic activity within  the liver, adrenal glands, spleen or pancreas. There is a new hypermetabolic soft tissue nodule in the right false pelvis, measuring 1.8 x 1.1 cm on image 128. This is likely a lymph node and has an SUV max of 20.1. No other hypermetabolic.  SKELETON  There is multifocal hypermetabolic osseous metastatic disease. Patient has undergone lower thoracic laminectomy and posterolateral fusion from T6 through L1. The hypermetabolic activity within the most affected T9 and T10 vertebral bodies has improved (SUV max 6.5, previously 9.8). Little change is seen in the other lesions. There are persistent large hypermetabolic lesions within the right iliac crest and right sacrum. The sacral lesion has an SUV max of 21.8. There are lesions within the right scapula. There is a lytic lesion posteriorly in the intertrochanteric region of the right femur without evidence of pathologic fracture at this time.  IMPRESSION: 1. No significant change in multifocal nodal, pleural and parenchymal metastatic disease within the thorax. 2. New nodal metastasis in the right false  pelvis. No other evidence of soft tissue metastasis within the abdomen or pelvis. 3. Little overall change in the extensive osseous metastatic disease. The lower thoracic pathologic fractures demonstrate decreased metabolic activity status post laminectomy, fusion and presumed radiation therapy.    PACs images for PET reviewed with patient and mother at time of visit.   Xray done after visit today: Dg Femur, Min 2 Views Right  01/01/2016  CLINICAL DATA:  Breast cancer metastatic to bone. PET recently showed an abnormality in the right femoral neck. Right proximal femoral pain. EXAM: RIGHT FEMUR 2 VIEWS COMPARISON:  PET 12/22/2015 and right femur radiographs 10/12/2015. FINDINGS: A lytic lesion in the proximal right femur, seen on 12/22/2015, is better seen on that study. There may be a small area of developing lucency in the proximal right femoral shaft. This would correspond to a focal abnormality on PET in the same area. IMPRESSION: 1. Small lucent lesion in the proximal right femoral shaft likely corresponds to an area of FDG uptake on 12/22/2015. 2. Lucent lesion in the proximal right femur is better seen on 12/22/2015. Electronically Signed   By: Lorin Picket M.D.   On: 01/01/2016 14:38    Medications: I have reviewed the patient's current medications. She is reluctant to use any pain medication. She will stop Aromasin now.  Will have [facility] oral chemo pharmacist and [place] look into best way to obtain Xeloda, to be dose reduced for cycle 1 to 1000 mg bid (rather than 1500 mg bid, BSA 1.8 m2) due to extent of bone involvement.   DISCUSSION Findings on PET discussed. Effectiveness of aromasin would be apparent now at 4 months on that drug, so clearly this is not benefitting adequately.  I have recommended that she change to chemotherapy, which she and mother understand is in attempt to control and palliate this metastatic disease. We have discussed oral xeloda bid x 14 days  every 21 days vs IV weekly taxol (days 07-29-13 q 21 days). Both patient and mother are more open to this discussion of change in therapy today; patient would prefer to try oral xeloda if possible. I have talked with them about the xeloda; following my visit, [facility] oral chemo pharmacist has done full teaching for xeloda.  Patient's cozaar (50 mg daily, 2C9 substrate) has potential interaction with xeloda (strong 2C9 inhibitor), xeloda potentially decreasing metabolism of losartan. Recommendation in pharmacist's information is to change to another antihypertensive while on xeloda, tho patient is concerned about changing  medication. I will be in touch with Dr Philip Aspen in this regard.   Will continue monthly zometa, dose today. Will get plain xray right femur due to increased uptake on PET, as need to be sure no impending pathologic fracture. If no impending fracture, will recommend considering radiation to that area by Dr Sondra Come. Depending on timing of RT may need to adjust xeloda also (not discussed now).    Assessment/Plan:  1.progressive metastatic breast cancer now involving right lung base/ pleura, central chest adenopathy, and extensive bone involvement: pathologic compression fracture T10 and retropulsion of bone impinging on spinal cord for which she had T6 - L1 posterolateral arthrodesis with T10 laminectomy and T9/ T11 partial laminectomy by Dr Christella Noa on 09-01-15.  No significant improvement now on Aromasin x 4 months, DC now. Plan oral xeloda starting 100 mg total dose bid x 14 days every 21 days if able to obtain this medication. Continue monthly zometa. Will ask Dr Sondra Come to see again for possible treatment right hip/ right femur.  2.post partial course of radiation thru 08-29-15 to T10 region. That seems to correlate with area of some improvement on PET. 3.hypercalcemia and metastatic bone involvement: calcium minimally elevated today. Consider change to pamidronate if diarrhea with zometa. Other  electrolyte supplements around zometa since she cannot tolerate gatorade. 4.Potential adverse interaction xeloda and cozaar. Will be in touch with Dr Philip Aspen.  5.diarrhea Jan treated for C diff ( Ab + toxin -) and resolved.  6.remote past tobacco, none since 1998 7.social situation: has been primary caregiver for 73 yo. Now back at home with mother.  8.Advance Directives in place 9. Some weight loss ongoing, encouraged good po intake.   All questions answered and patient and mother seem to follow discussion well. We will let her know that right femur xray after visit does not show impending fracture, will ask Dr Sondra Come to see her again. Xeloda planning as above. Will let Dr Philip Aspen know losartan considerations. Time spent 45 min including >50% counseling and coordination of care.    LIVESAY,LENNIS P, MD   01/02/2016, 3:22 PM

## 2016-01-02 ENCOUNTER — Telehealth: Payer: Self-pay | Admitting: Oncology

## 2016-01-02 ENCOUNTER — Telehealth: Payer: Self-pay

## 2016-01-02 ENCOUNTER — Other Ambulatory Visit: Payer: Self-pay | Admitting: Oncology

## 2016-01-02 DIAGNOSIS — C7951 Secondary malignant neoplasm of bone: Principal | ICD-10-CM

## 2016-01-02 DIAGNOSIS — C50911 Malignant neoplasm of unspecified site of right female breast: Secondary | ICD-10-CM

## 2016-01-02 NOTE — Telephone Encounter (Signed)
-----   Message from Gordy Levan, MD sent at 01/02/2016  2:29 PM EDT ----- Need to send prescription to [facility] for Xeloday 500 mg  2 tabs = 1000 mg bid x 14 days every 21 days #56 for cycle 1  [place] involved, this to see how best to get drug for Ms.Dimartino. We will need to be sure to know when she starts so that I can coordinate follow up correctly.  thanks

## 2016-01-02 NOTE — Telephone Encounter (Signed)
Spoke with patient to confirm appt with Dr Sondra Come 6/21 at 830 am per LL 6/13 pof

## 2016-01-02 NOTE — Telephone Encounter (Signed)
Medical Oncology  Phone call to patient to let her know results of right femur xray from 01-01-16. LMOM asking her to return call to office.  Need to let her know that the xray shows slight increase in the bone involvement. I would like her to see Dr Sondra Come again and have requested appointment with him.  Tell her that we are working on xeloda and that I will discuss her cozaar with pharmacist and will be in touch with Dr [name].   Godfrey Pick, MD

## 2016-01-02 NOTE — Telephone Encounter (Signed)
Physically gave this note  to pharmacist Emeline Darling so she could verify dose.  Told her Dr. Marko Plume not in office until Thursday 01-04-16 for physical prescription to be printed and signed for pharmacist for Oral Chemo drugs to fax to [facility].

## 2016-01-02 NOTE — Telephone Encounter (Signed)
Lisa [name] returned Dr. Mariana Kaufman call.  Gave her the information as noted below by Dr. Marko Plume. Lisa [name] appointment is scheduled for 01-10-16 at 0830. Lisa [name] appreciative of Dr. Mariana Kaufman efforts.

## 2016-01-03 ENCOUNTER — Telehealth: Payer: Self-pay | Admitting: Oncology

## 2016-01-03 ENCOUNTER — Telehealth: Payer: Self-pay

## 2016-01-03 MED ORDER — CAPECITABINE 500 MG PO TABS: 625.0000 mg/m2 | ORAL_TABLET | Freq: Two times a day (BID) | ORAL | Status: DC

## 2016-01-03 MED FILL — XELODA 500 MG TABLET: 500 | 14 days supply | Qty: 56 | Fill #0

## 2016-01-03 NOTE — Telephone Encounter (Signed)
Medical Oncology  LM for Dr Kaylyn Lim RN requesting to speak with MD covering for Dr Philip Aspen re patient's cozaar, as this may not be best with planned Xeloda. Dr Philip Aspen out until next week per that office reception. Left my office cell and pager for return call  L.Marko Plume, MD

## 2016-01-03 NOTE — Telephone Encounter (Signed)
S/w Sonya in oral chemo clinic. E-scribed xeloda to [facility] outpatient pharmacy.

## 2016-01-03 NOTE — Telephone Encounter (Signed)
S/w pt per Dr Edwyna Shell attached message. Pt told 140/90 as high BP and to watch for trends as well. Pt will stop cozaar. Pt will be taking BP at home BID.

## 2016-01-03 NOTE — Telephone Encounter (Signed)
-----   Message from Gordy Levan, MD sent at 01/03/2016  1:03 PM EDT ----- Please let patient know that I have discussed with Dr Shon Baton physician partner, as Dr Philip Aspen is away this week. We went over her meds, her BPs at [facility], fact that weight is down a little. Recommendation is that she stop cozaar, does not need to taper off, really may just not need BP med now. We will watch BP closely off of cozaar and not on any other blood pressure medicine now, will keep Dr Philip Aspen informed, and if we need to do something differently as we go, we will figure that out.  I would suggest stopping cozaar this week, so that she can get used to that for several days to a week before starting xeloda.  She certainly can check BP at home and keep a log of that.  thanks

## 2016-01-05 ENCOUNTER — Telehealth: Payer: Self-pay

## 2016-01-05 NOTE — Telephone Encounter (Signed)
Ms Schodowski started the Xeloda this am 01-05-16.  Took Zofran 1/2 hour prior to dose as instructed by Kennith Center, Pharm.D., CPP  Oral chemotherapy clinic. Reinforced to stop taking the Xeloda if diarrhea is at night or > then 4  BM in a  day or vomit > 1 in 24 hours and call the office.   She has imodium AD and instructed how to use by pharmacist. Will follow up with Ms. Kunkel next week to see how she is tolerating the Xeloda.

## 2016-01-05 NOTE — Telephone Encounter (Signed)
LM in voice mail to call back to discuss when to begin Xeloda and to make sure she has antiemetic at home.

## 2016-01-05 NOTE — Progress Notes (Signed)
Histology and Location of Primary Cancer: progressive metastatic breast cancer  Location(s) of Symptomatic Metastases: lytic lesion in the proximal right femur   Past/Anticipated chemotherapy by medical oncology, if any: oral xeloda bid x 14 days every 21 days and monthly zometa.   Pain on a scale of 0-10 is: 0 - reports having pain in her thoracic spine when tired.    Ambulatory status? Walker? Wheelchair?: Ambulatory with a cane  SAFETY ISSUES:  Prior radiation? Yes -08/17/2015-08/29/2015 lower thoracic spine 22.5 gray    Pacemaker/ICD? no  Possible current pregnancy? no  Is the patient on methotrexate? no  Current Complaints / other details:  Patient is here with her mother.    BP 152/77 mmHg  Pulse 80  Temp(Src) 98.9 F (37.2 C) (Oral)  Ht 5\' 3"  (1.6 m)  Wt 172 lb 8 oz (78.245 kg)  BMI 30.56 kg/m2  SpO2 98%   Wt Readings from Last 3 Encounters:  01/10/16 172 lb 8 oz (78.245 kg)  01/01/16 165 lb 3.2 oz (74.934 kg)  12/07/15 167 lb 6.4 oz (75.932 kg)

## 2016-01-08 ENCOUNTER — Other Ambulatory Visit: Payer: Self-pay

## 2016-01-08 DIAGNOSIS — C50919 Malignant neoplasm of unspecified site of unspecified female breast: Secondary | ICD-10-CM

## 2016-01-08 DIAGNOSIS — C782 Secondary malignant neoplasm of pleura: Principal | ICD-10-CM

## 2016-01-08 MED ORDER — ONDANSETRON HCL 8 MG PO TABS: 8.0000 mg | ORAL_TABLET | Freq: Three times a day (TID) | ORAL | Status: DC | PRN

## 2016-01-08 MED FILL — ONDANSETRON HCL 8 MG TABLET: 8 | 6 days supply | Qty: 20 | Fill #0

## 2016-01-08 NOTE — Telephone Encounter (Signed)
Sent in refill for Zofran to [place] as Ms. Lisa [name] requested.

## 2016-01-08 NOTE — Telephone Encounter (Signed)
Lisa [name] states that she is tolerating the Xeloda just fine so far. No symptoms. She is taking a Zofran tablet 30 min prior to taking doses.  A refill was sent to [facility] out patient pharmacy per her request.

## 2016-01-08 NOTE — Telephone Encounter (Signed)
Dr. Marko Plume to see Lisa [name] on 01-18-16.   Dr. Marko Plume would like RN to follow up with her ~ 6-23 and 01-15-16

## 2016-01-10 ENCOUNTER — Encounter: Payer: Self-pay | Admitting: Radiation Oncology

## 2016-01-10 ENCOUNTER — Ambulatory Visit
Admission: RE | Admit: 2016-01-10 | Discharge: 2016-01-10 | Disposition: A | Discharge status: Home / Self Care | Payer: Medicare Other | Source: Ambulatory Visit | Attending: Radiation Oncology | Admitting: Radiation Oncology

## 2016-01-10 VITALS — BP 152/77 | HR 80 | Temp 98.9°F | Ht 63.0 in | Wt 172.5 lb

## 2016-01-10 DIAGNOSIS — Z510 Encounter for antineoplastic radiation therapy: Secondary | ICD-10-CM | POA: Diagnosis present

## 2016-01-10 DIAGNOSIS — C50919 Malignant neoplasm of unspecified site of unspecified female breast: Secondary | ICD-10-CM | POA: Insufficient documentation

## 2016-01-10 DIAGNOSIS — C7951 Secondary malignant neoplasm of bone: Secondary | ICD-10-CM

## 2016-01-10 DIAGNOSIS — C50911 Malignant neoplasm of unspecified site of right female breast: Secondary | ICD-10-CM

## 2016-01-10 NOTE — Progress Notes (Addendum)
  Radiation Oncology         [phone] ________________________________  Name: AMERA TORNO MRN: [id number]  Date: 01/10/2016  DOB: 1940/08/30  SIMULATION AND TREATMENT PLANNING NOTE    ICD-9-CM ICD-10-CM   1. Bone metastases (HCC) 198.5 C79.51     DIAGNOSIS:  Metastatic breast cancer with osseous metastasis  NARRATIVE:  The patient was brought to the [facility].  Identity was confirmed.  All relevant records and images related to the planned course of therapy were reviewed.  The patient freely provided informed written consent to proceed with treatment after reviewing the details related to the planned course of therapy. The consent form was witnessed and verified by the simulation staff.  Then, the patient was set-up in a stable reproducible  supine position for radiation therapy.  CT images were obtained.  Surface markings were placed.  The CT images were loaded into the planning software.  Then the target and avoidance structures were contoured.  Treatment planning then occurred.  The radiation prescription was entered and confirmed.  Then, I designed and supervised the construction of a total of 3 medically necessary complex treatment devices.  I have requested : Isodose Plan.  I have ordered: Dose Calc.  PLAN:  The patient will receive 20 Gy in 5 fractions Directed at the right proximal and mid-femur.  ________________________________ -----------------------------------  Blair Promise, PhD, MD  This document serves as a record of services personally performed by Gery Pray, MD. It was created on his behalf by Darcus Austin, a trained medical scribe. The creation of this record is based on the scribe's personal observations and the provider's statements to them. This document has been checked and approved by the attending provider.

## 2016-01-10 NOTE — Progress Notes (Signed)
Radiation Oncology         [phone]) [phone] ________________________________  Name: Lisa [name] MRN: [id number]  Date: 01/10/2016  DOB: March 28, 1941  Re-Evaluation Visit Note  CC: [name], MD  Gordy Levan, MD   Diagnosis: Metastatic breast cancer with osseous metastasis  Radiation treatment dates: 4 months ago  08/17/2015-08/29/2015: Lower thoracic spine 22.5 gray in 9 fractions  Narrative:  The patient returns today for a re-evaluation. The patient had a restaging PET scan on 12/22/15. This showed no significant change in multifocal nodal, pleural, and parenchymal metastatic disease in the thorax and a new nodal metastasis in the right false pelvis. There is little change in her osseous metastatic disease, but there is improvement of the hypermetabolic activity in the T9 and T10 vertebral bodies. The PET scan also showed a  Lytic lesion in the right femoral neck. The patient has pain in this area. The patient had an X-ray of this area on 01/01/16 shows a small area of developing lucency in the proximal right femoral shaft. The patient presents today, with her mother, to discuss the role of radiation to this area.  ROS: The patient reports back pain when she presses against the back of a chair for a long period of time and pain localized to where "the elastic of my pants are." Denies leg weakness. Denies urinary difficulties. She reports limping on her right leg and more pain in that area.  ALLERGIES:  is allergic to diphenhydramine hcl; codeine sulfate; oseltamivir phosphate; oxycodone-acetaminophen; prednisone; and sudafed.  Meds: Current Outpatient Prescriptions  Medication Sig Dispense Refill  . acetaminophen (TYLENOL) 325 MG tablet Take 325 mg by mouth every 6 (six) hours as needed. Reported on 01/10/2016    . capecitabine (XELODA) 500 MG tablet Take 2 tablets (1,000 mg total) by mouth 2 (two) times daily after a meal. For 14 days. Every 21 days. 56 tablet 0  . loratadine  (CLARITIN) 10 MG tablet Take 10 mg by mouth daily.     . ondansetron (ZOFRAN) 8 MG tablet Take 1 tablet (8 mg total) by mouth every 8 (eight) hours as needed for nausea or vomiting. 20 tablet 2  . simvastatin (ZOCOR) 20 MG tablet Take 20 mg by mouth at bedtime.      . vitamin B-12 (CYANOCOBALAMIN) 1000 MCG tablet Take 1,000 mcg by mouth 2 (two) times daily.    . vitamin C (ASCORBIC ACID) 500 MG tablet Take 500 mg by mouth daily.      . vitamin E 200 UNIT capsule Take 200 Units by mouth daily.      . ferrous sulfate 325 (65 FE) MG tablet Take 325 mg by mouth 2 (two) times a week. Reported on 01/10/2016    . Multiple Vitamins-Minerals (MULTIVITAMIN WITH MINERALS) tablet Take 1 tablet by mouth daily. Reported on 01/10/2016    . traMADol (ULTRAM) 50 MG tablet Take 1-2 tablets (50-100 mg total) by mouth every 4 (four) hours as needed for moderate pain. (Patient not taking: Reported on 01/10/2016) 15 tablet 0   No current facility-administered medications for this encounter.    Physical Findings: The patient is in no acute distress. Patient is alert and oriented.  height is 5\' 3"  (1.6 m) and weight is 172 lb 8 oz (78.245 kg). Her oral temperature is 98.9 F (37.2 C). Her blood pressure is 152/77 and her pulse is 80. Her oxygen saturation is 98%.   No palpable cervical, supraclavicular or axillary lymphoadenopathy. The heart has a regular  rate and rhythm. The lungs are clear to auscultation. Long vertical scar along the upper-mid back region. Healed well with no sign of recurrence. Lower motor strength 5/5 in proximal and distal groups. The patient is ambulatory with a cane.  She points to some discomfort in the right groin area.  Lab Findings: Lab Results  Component Value Date   WBC 5.7 01/01/2016   HGB 14.0 01/01/2016   HCT 40.6 01/01/2016   MCV 84.1 01/01/2016   PLT 171 01/01/2016    Radiographic Findings: Nm Pet Image Restag (ps) Skull Base To Thigh  12/22/2015  CLINICAL DATA:  Subsequent  treatment strategy for metastatic breast cancer. EXAM: NUCLEAR MEDICINE PET SKULL BASE TO THIGH TECHNIQUE: 8.9 mCi F-18 FDG was injected intravenously. Full-ring PET imaging was performed from the skull base to thigh after the radiotracer. CT data was obtained and used for attenuation correction and anatomic localization. FASTING BLOOD GLUCOSE:  Value: 97 mg/dl COMPARISON:  PET-CT 09/20/2015 FINDINGS: NECK No hypermetabolic cervical lymph nodes are identified.There are no lesions of the pharyngeal mucosal space. CHEST Again demonstrated are multiple small hypermetabolic lymph nodes at the thoracic inlet bilaterally. These are similar to the prior study. SUV max is 12.1 on the right and 11.8 on the left. Multiple hypermetabolic mediastinal and bilateral hilar lymph nodes are also similar to the prior study. Right hilar node has an SUV max of 18.9 (previously 17.9). There is a subcarinal nodal mass with an SUV max of 16.3. There is a persistent hypermetabolic, partially calcified pleural parenchymal mass at the right lung base, measuring 5.8 x 4.4 cm. This has an SUV max of 27.2. There is additional hypermetabolic pleural disease extending into the right-sided fissures which appears similar. ABDOMEN/PELVIS There is no hypermetabolic activity within the liver, adrenal glands, spleen or pancreas. There is a new hypermetabolic soft tissue nodule in the right false pelvis, measuring 1.8 x 1.1 cm on image 128. This is likely a lymph node and has an SUV max of 20.1. No other hypermetabolic. SKELETON There is multifocal hypermetabolic osseous metastatic disease. Patient has undergone lower thoracic laminectomy and posterolateral fusion from T6 through L1. The hypermetabolic activity within the most affected T9 and T10 vertebral bodies has improved (SUV max 6.5, previously 9.8). Little change is seen in the other lesions. There are persistent large hypermetabolic lesions within the right iliac crest and right sacrum. The  sacral lesion has an SUV max of 21.8. There are lesions within the right scapula. There is a lytic lesion posteriorly in the intertrochanteric region of the right femur without evidence of pathologic fracture at this time. IMPRESSION: 1. No significant change in multifocal nodal, pleural and parenchymal metastatic disease within the thorax. 2. New nodal metastasis in the right false pelvis. No other evidence of soft tissue metastasis within the abdomen or pelvis. 3. Little overall change in the extensive osseous metastatic disease. The lower thoracic pathologic fractures demonstrate decreased metabolic activity status post laminectomy, fusion and presumed radiation therapy. Electronically Signed   By: Richardean Sale M.D.   On: 12/22/2015 16:51   Dg Femur, Min 2 Views Right  01/01/2016  CLINICAL DATA:  Breast cancer metastatic to bone. PET recently showed an abnormality in the right femoral neck. Right proximal femoral pain. EXAM: RIGHT FEMUR 2 VIEWS COMPARISON:  PET 12/22/2015 and right femur radiographs 10/12/2015. FINDINGS: A lytic lesion in the proximal right femur, seen on 12/22/2015, is better seen on that study. There may be a small area of developing lucency in  the proximal right femoral shaft. This would correspond to a focal abnormality on PET in the same area. IMPRESSION: 1. Small lucent lesion in the proximal right femoral shaft likely corresponds to an area of FDG uptake on 12/22/2015. 2. Lucent lesion in the proximal right femur is better seen on 12/22/2015. Electronically Signed   By: Lorin Picket M.D.   On: 01/01/2016 14:38    Impression:  The patient is recovering from the effects of radiation to her thoracic spine. We discussed the role of palliative radiation to the right femur to prevent a pathologic fracture in the future and mild pain issues. We discussed the process of CT simulation and the placement of tattoos. We discussed side effects, benefits, and long term toxicities of  treatment. The patient expressed understanding.   Plan: The patient will undergo CT simulation today and undergo radiation treatment next week. The patient signed a consent form and this was placed in her medical chart.  Anticipate between 5 and 10 radiation treatments.  -----------------------------------  Blair Promise, PhD, MD  This document serves as a record of services personally performed by Gery Pray, MD. It was created on his behalf by Darcus Austin, a trained medical scribe. The creation of this record is based on the scribe's personal observations and the provider's statements to them. This document has been checked and approved by the attending provider.

## 2016-01-10 NOTE — Progress Notes (Signed)
Please see the Nurse Progress Note in the MD Initial Consult Encounter for this patient. 

## 2016-01-12 ENCOUNTER — Telehealth: Payer: Self-pay | Admitting: *Deleted

## 2016-01-12 NOTE — Telephone Encounter (Signed)
Spoke with patient regarding xeloda. States she is doing well, no problems.  6/20, BP 133/77 6/22, BP 129/87 6/23, BP 128/85

## 2016-01-14 ENCOUNTER — Other Ambulatory Visit: Payer: Self-pay | Admitting: Oncology

## 2016-01-15 ENCOUNTER — Telehealth: Payer: Self-pay | Admitting: *Deleted

## 2016-01-15 NOTE — Telephone Encounter (Signed)
Pt states she is doing very well. Occasional headache, relieved by tylenol.  6/24 BP: 125/83, pulse 79 6/25 BP: 129/79, pulse 74  6/26 BP: 120/71, pulse 76

## 2016-01-15 NOTE — Telephone Encounter (Signed)
-----   Message from Gordy Levan, MD sent at 01/14/2016  8:17 PM EDT ----- Please check on her by phone again 6-26, which will be day 11 cycle 1 xeloda. I would like CBC CMET on 6-26 if any concerns, as I do not see her until 6-29. Need to be sure to see results and let patient know if so.  Thank you

## 2016-01-16 DIAGNOSIS — Z510 Encounter for antineoplastic radiation therapy: Secondary | ICD-10-CM | POA: Diagnosis not present

## 2016-01-16 NOTE — Addendum Note (Signed)
Encounter addended by: Gery Pray, MD on: 01/16/2016  4:45 PM<BR>     Documentation filed: Notes Section

## 2016-01-17 ENCOUNTER — Ambulatory Visit
Admission: RE | Admit: 2016-01-17 | Discharge: 2016-01-17 | Disposition: A | Discharge status: Home / Self Care | Payer: Medicare Other | Source: Ambulatory Visit | Attending: Radiation Oncology | Admitting: Radiation Oncology

## 2016-01-17 ENCOUNTER — Encounter: Payer: Self-pay | Admitting: Radiation Oncology

## 2016-01-17 VITALS — BP 150/92 | HR 72 | Temp 98.6°F | Ht 63.0 in | Wt 169.9 lb

## 2016-01-17 DIAGNOSIS — Z510 Encounter for antineoplastic radiation therapy: Secondary | ICD-10-CM | POA: Diagnosis not present

## 2016-01-17 DIAGNOSIS — C7951 Secondary malignant neoplasm of bone: Secondary | ICD-10-CM

## 2016-01-17 NOTE — Progress Notes (Signed)
Latice Shellenberger has completed 1 fraction to her left femur.  She denies having pain today but reports having some pain in her left leg with walking.  She is taking Xeloda and reports having slight constipation this morning.  She is wondering if she can take a stool softener.    BP 150/92 mmHg  Pulse 72  Temp(Src) 98.6 F (37 C) (Oral)  Ht 5\' 3"  (1.6 m)  Wt 169 lb 14.4 oz (77.066 kg)  BMI 30.10 kg/m2  SpO2 100%   Wt Readings from Last 3 Encounters:  01/17/16 169 lb 14.4 oz (77.066 kg)  01/10/16 172 lb 8 oz (78.245 kg)  01/01/16 165 lb 3.2 oz (74.934 kg)

## 2016-01-17 NOTE — Progress Notes (Signed)
  Radiation Oncology         [phone]) [phone] ________________________________  Name: Lisa [name] MRN: [id number]  Date: 01/17/2016  DOB: [birth year]  Weekly Radiation Therapy Management    ICD-9-CM ICD-10-CM   1. Bone metastases (HCC) 198.5 C79.51      Current Dose: 4 Gy     Planned Dose:  20 Gy  Narrative . . . . . . . . The patient presents for routine under treatment assessment.          Lisa [name] has completed 1 fraction to her right femur. She denies having pain today, but reports having some pain in her right leg while walking. She is taking Xeloda and reports having slight constipation this morning. She is wondering if she can take a stool softener.                                 Set-up films were reviewed.                                 The chart was checked. Physical Findings. . .  height is 5\' 3"  (1.6 m) and weight is 169 lb 14.4 oz (77.066 kg). Her oral temperature is 98.6 F (37 C). Her blood pressure is 150/92 and her pulse is 72. Her oxygen saturation is 100%. . Weight essentially stable.  No significant changes. Lungs are clear to auscultation bilaterally. Heart has regular rate and rhythm. Abdomen soft, non-tender, normal bowel sounds. Impression . . . . . . . The patient is tolerating radiation. Plan . . . . . . . . . . . . Continue treatment as planned. ________________________________   Blair Promise, PhD, MD  This document serves as a record of services personally performed by Gery Pray, MD. It was created on his behalf by Darcus Austin, a trained medical scribe. The creation of this record is based on the scribe's personal observations and the provider's statements to them. This document has been checked and approved by the attending provider.

## 2016-01-17 NOTE — Progress Notes (Signed)
  Radiation Oncology         [phone]) [phone] ________________________________  Name: Lisa [name] MRN: [id number]  Date: 01/17/2016  DOB: 09/29/1940  Simulation Verification Note    ICD-9-CM ICD-10-CM   1. Bone metastases (HCC) 198.5 C79.51     Status: outpatient  NARRATIVE: The patient was brought to the treatment unit and placed in the planned treatment position. The clinical setup was verified. Then port films were obtained and uploaded to the radiation oncology medical record software.  The treatment beams were carefully compared against the planned radiation fields. The position location and shape of the radiation fields was reviewed. They targeted volume of tissue appears to be appropriately covered by the radiation beams. Organs at risk appear to be excluded as planned.  Based on my personal review, I approved the simulation verification. The patient's treatment will proceed as planned.  -----------------------------------  Blair Promise, PhD, MD  This document serves as a record of services personally performed by Gery Pray, MD. It was created on his behalf by Darcus Austin, a trained medical scribe. The creation of this record is based on the scribe's personal observations and the provider's statements to them. This document has been checked and approved by the attending provider.

## 2016-01-18 ENCOUNTER — Ambulatory Visit
Admission: RE | Admit: 2016-01-18 | Discharge: 2016-01-18 | Disposition: A | Discharge status: Home / Self Care | Payer: Medicare Other | Source: Ambulatory Visit | Attending: Radiation Oncology | Admitting: Radiation Oncology

## 2016-01-18 ENCOUNTER — Other Ambulatory Visit (HOSPITAL_BASED_OUTPATIENT_CLINIC_OR_DEPARTMENT_OTHER): Payer: Medicare Other

## 2016-01-18 ENCOUNTER — Ambulatory Visit (HOSPITAL_BASED_OUTPATIENT_CLINIC_OR_DEPARTMENT_OTHER): Payer: Medicare Other | Admitting: Oncology

## 2016-01-18 ENCOUNTER — Encounter: Payer: Self-pay | Admitting: Oncology

## 2016-01-18 ENCOUNTER — Telehealth: Payer: Self-pay | Admitting: Oncology

## 2016-01-18 VITALS — BP 133/79 | HR 86 | Temp 98.5°F | Resp 18 | Ht 63.0 in | Wt 171.6 lb

## 2016-01-18 DIAGNOSIS — J910 Malignant pleural effusion: Secondary | ICD-10-CM | POA: Diagnosis not present

## 2016-01-18 DIAGNOSIS — C50911 Malignant neoplasm of unspecified site of right female breast: Secondary | ICD-10-CM

## 2016-01-18 DIAGNOSIS — Z5111 Encounter for antineoplastic chemotherapy: Secondary | ICD-10-CM

## 2016-01-18 DIAGNOSIS — Z79899 Other long term (current) drug therapy: Secondary | ICD-10-CM

## 2016-01-18 DIAGNOSIS — I10 Essential (primary) hypertension: Secondary | ICD-10-CM | POA: Diagnosis not present

## 2016-01-18 DIAGNOSIS — C7951 Secondary malignant neoplasm of bone: Secondary | ICD-10-CM

## 2016-01-18 DIAGNOSIS — C782 Secondary malignant neoplasm of pleura: Secondary | ICD-10-CM

## 2016-01-18 DIAGNOSIS — C7989 Secondary malignant neoplasm of other specified sites: Secondary | ICD-10-CM

## 2016-01-18 DIAGNOSIS — Z510 Encounter for antineoplastic radiation therapy: Secondary | ICD-10-CM | POA: Diagnosis not present

## 2016-01-18 LAB — CBC WITH DIFFERENTIAL/PLATELET
BASO%: 0.4 % (ref 0.0–2.0)
Basophils Absolute: 0 10*3/uL (ref 0.0–0.1)
EOS%: 4.2 % (ref 0.0–7.0)
Eosinophils Absolute: 0.2 10*3/uL (ref 0.0–0.5)
HCT: 37.9 % (ref 34.8–46.6)
HGB: 13.2 g/dL (ref 11.6–15.9)
LYMPH%: 19.4 % (ref 14.0–49.7)
MCH: 29.5 pg (ref 25.1–34.0)
MCHC: 34.8 g/dL (ref 31.5–36.0)
MCV: 84.6 fL (ref 79.5–101.0)
MONO#: 0.5 10*3/uL (ref 0.1–0.9)
MONO%: 10.9 % (ref 0.0–14.0)
NEUT%: 65.1 % (ref 38.4–76.8)
NEUTROS ABS: 3.2 10*3/uL (ref 1.5–6.5)
PLATELETS: 198 10*3/uL (ref 145–400)
RBC: 4.48 10*6/uL (ref 3.70–5.45)
RDW: 15 % — AB (ref 11.2–14.5)
WBC: 5 10*3/uL (ref 3.9–10.3)
lymph#: 1 10*3/uL (ref 0.9–3.3)

## 2016-01-18 LAB — COMPREHENSIVE METABOLIC PANEL
ALT: 18 U/L (ref 0–55)
ANION GAP: 9 meq/L (ref 3–11)
AST: 26 U/L (ref 5–34)
Albumin: 3.8 g/dL (ref 3.5–5.0)
Alkaline Phosphatase: 79 U/L (ref 40–150)
BUN: 18.9 mg/dL (ref 7.0–26.0)
CO2: 22 meq/L (ref 22–29)
Calcium: 10.4 mg/dL (ref 8.4–10.4)
Chloride: 109 mEq/L (ref 98–109)
Creatinine: 1.2 mg/dL — ABNORMAL HIGH (ref 0.6–1.1)
EGFR: 44 mL/min/{1.73_m2} — ABNORMAL LOW (ref >90)
Glucose: 138 mg/dl (ref 70–140)
Potassium: 3.4 mEq/L — ABNORMAL LOW (ref 3.5–5.1)
SODIUM: 140 meq/L (ref 136–145)
TOTAL PROTEIN: 7.1 g/dL (ref 6.4–8.3)
Total Bilirubin: 1.11 mg/dL (ref 0.20–1.20)

## 2016-01-18 NOTE — Progress Notes (Signed)
OFFICE PROGRESS NOTE   January 18, 2016   Physicians: J.Kinard, [name], R.Donneta Romberg, A.Kendall. K.Kellogg  Patient is seen, together with mother, in continuing attention to progressive metastatic right breast cancer involving bone and right chest. She did not have adequate response to 4 months of Aromasin, with treatment changed to Xeloda beginning 01-05-16. She is receiving radiation to right femur, to complete on 01-24-16.  Last zometa 01-01-16, tolerated without difficulty.  Xeloda for cycle 1 was dosed at 1000 mg bid, completing 14 days today; plan is to use this bid x 14 days every 21 days. RN has followed up by phone since start of xeloda, no concerns. Patient has tolerated cycle 1 at this dose with no difficulty. She has taken zofran prior to each dose, but has had no nausea. She denies mucositis or any diarrhea. She denies fatigue, palmar plantar dysesthesia, skin irritation.Appetite has been good. Pain in right hip area is already improved. She stopped cozaar 01-03-16 due to potential interaction with xeloda, with log of home BPs off of cozaar 6-15 thru 01-18-16:  111-144/ 71-86, mostly in 120s/70s. No fever. No new or different pain. Walking more easily, still uses cane or walker. No increased SOB or chest pain. No bleeding. No LE swelling. Sight constipation one day this week.  Remainder of 10 point Review of Systems negative.    No PAC No genetics testing.    ONCOLOGIC HISTORY The right breast carcinoma initially was T2 N1, ER/PR-positive in December 1998, treated with mastectomy with 12 node axillary evaluation and adjuvant Adriamycin/Cytoxan followed by 5 years of tamoxifen through December 2003. 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 CA 2729 was 434 in April 2010. She has tolerated Femara well and clinically has continued to do very  well, though CA 27.29 has never normalized. 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 Patient seen by medical oncology 08-10-15 and radiation begun shortly afterwards by Dr Sondra Come, 22.5 of planned 35 Gy radiation to area of T10 thru 08-29-15. She had first zometa 08-11-15. MRI T spine obtained after some difficulty on 08-29-15, with pathologic compression fracture T10 with retropulsion of compressed vertebra to right with compression of cord called, involvement also T9, T11, L2. She had thoracic six- lumbar one posterolateral arthrodesis, T10 laminectomy, partial laminectomy T9, T11 by Dr Christella Noa on 09-01-15. She was begun on Aromasin on 09-07-15.  PET had been scheduled for 09-04-15, delayed with surgery, accomplished on 09-20-15 with chest adenopathy, increased soft tissue right lung base, extensive bone involvement.  Repeat PET 12-22-15 some improvement in area of radiation T spine, otherwise no improvement in bones, some increase right femur, some central chest adenopathy and right lower chest/ pleural involvement. Aromasin was discontinued and xeloda begun 01-05-16.   Objective:  Vital signs in last 24 hours:  BP 133/79 mmHg  Pulse 86  Temp(Src) 98.5 F (36.9 C) (Oral)  Resp 18  Ht 5' 3"  (1.6 m)  Wt 171 lb 9.6 oz (77.837 kg)  BMI 30.41 kg/m2  SpO2 98% Weight stable Alert, oriented and appropriate. More easily mobile and ambulatory in exam room, using rolling walker. No alopecia  HEENT:PERRL, sclerae not icteric. Oral mucosa moist without lesions, posterior pharynx clear.  Neck supple. No JVD.  Lymphatics:no cervical,supraclavicular adenopathy Resp: BS heard to lower fields bilaterally, somewhat diminished  right base, no wheezes or rales.  Cardio: regular rate and rhythm. No gallop. GI: soft, nontender, not distended, no mass or organomegaly. Normally active bowel sounds. Surgical incision not remarkable. Musculoskeletal/  Extremities: LE/ UE without pitting edema, cords, tenderness.  Neuro: no peripheral neuropathy. Otherwise nonfocal. PSYCH appropriate mood and affect Skin without rash, ecchymosis, petechiae. Palms not remarkable  Lab Results:  Results for orders placed or performed in visit on 01/18/16  CBC with Differential  Result Value Ref Range   WBC 5.0 3.9 - 10.3 10e3/uL   NEUT# 3.2 1.5 - 6.5 10e3/uL   HGB 13.2 11.6 - 15.9 g/dL   HCT 37.9 34.8 - 46.6 %   Platelets 198 145 - 400 10e3/uL   MCV 84.6 79.5 - 101.0 fL   MCH 29.5 25.1 - 34.0 pg   MCHC 34.8 31.5 - 36.0 g/dL   RBC 4.48 3.70 - 5.45 10e6/uL   RDW 15.0 (H) 11.2 - 14.5 %   lymph# 1.0 0.9 - 3.3 10e3/uL   MONO# 0.5 0.1 - 0.9 10e3/uL   Eosinophils Absolute 0.2 0.0 - 0.5 10e3/uL   Basophils Absolute 0.0 0.0 - 0.1 10e3/uL   NEUT% 65.1 38.4 - 76.8 %   LYMPH% 19.4 14.0 - 49.7 %   MONO% 10.9 0.0 - 14.0 %   EOS% 4.2 0.0 - 7.0 %   BASO% 0.4 0.0 - 2.0 %  Comprehensive metabolic panel  Result Value Ref Range   Sodium 140 136 - 145 mEq/L   Potassium 3.4 (L) 3.5 - 5.1 mEq/L   Chloride 109 98 - 109 mEq/L   CO2 22 22 - 29 mEq/L   Glucose 138 70 - 140 mg/dl   BUN 18.9 7.0 - 26.0 mg/dL   Creatinine 1.2 (H) 0.6 - 1.1 mg/dL   Total Bilirubin 1.11 0.20 - 1.20 mg/dL   Alkaline Phosphatase 79 40 - 150 U/L   AST 26 5 - 34 U/L   ALT 18 0 - 55 U/L   Total Protein 7.1 6.4 - 8.3 g/dL   Albumin 3.8 3.5 - 5.0 g/dL   Calcium 10.4 8.4 - 10.4 mg/dL   Anion Gap 9 3 - 11 mEq/L   EGFR 44 (L) >90 ml/min/1.73 m2     Studies/Results:  No results found.  Medications: I have reviewed the patient's current medications. She may try changing zofran to prn rather than prior to each dose, tho has done so well this first cycle that she may prefer to continue same.  CYcle 1 xeloda was dose reduced due to age/ PS/ RT, but certainly well tolerated thus far and may be sufficient. Will give zometa with my next visit 02-05-16, which is 5 weeks from last but preferable to  another trip to office  DISCUSSION We are pleased with how well she has tolerated cycle 1 xeloda and with improvement already obvious from radiation. Will get CBC on 01-24-16 to confirm ok to begin cycle 2 xeloda on 01-25-16. Will keep dosing same at 1000 mg bid for cycle 2; per patient, she received more than 56 tablets with the initial prescription  Suggested increase in K in diet.  Assessment/Plan:  1.progressive metastatic breast cancer now involving right lung base/ pleura, central chest adenopathy, and extensive bone involvement: pathologic compression fracture T10 and retropulsion of bone impinging on spinal cord for which she had T6 - L1 posterolateral arthrodesis with T10 laminectomy and T9/ T11 partial laminectomy by Dr Christella Noa on 09-01-15.  No significant improvement on Aromasin x 4  months, DC 01-02-16. Oral xeloda 1000 mg  bid x 14 days every 21 days, cycle 1 from 6-16 thru 01-18-16. Continue monthly zometa. Radiation to right hip/ right femur to complete during this week off of xeloda, Dr Clabe Seal help appreciated. 2.post partial course of radiation thru 08-29-15 to T10 region,  correlate with area of some improvement on PET. 3.hypercalcemia and metastatic bone involvement: calcium best today that she has had. Other electrolyte supplements around zometa since she cannot tolerate gatorade. 4.Potential adverse interaction xeloda and cozaar, cozaar DCd on 01-03-16. BPs good, watching closely at home  5.diarrhea Jan treated for C diff ( Ab + toxin -) and resolved.  6.remote past tobacco, none since 1998 7.social situation: has been primary caregiver for 73 yo. Now back at home with mother.  8.Advance Directives in place 9. No further weight loss today, following.    All questions answered. Message to collaborative RN for CBC on 01-24-16. Time spent 25 min including >50% counseling and coordination of care. Route Dr Philip Aspen.    Gordy Levan, MD   01/18/2016, 5:36 PM

## 2016-01-18 NOTE — Telephone Encounter (Signed)
appt made and avs printed °

## 2016-01-19 ENCOUNTER — Ambulatory Visit
Admission: RE | Admit: 2016-01-19 | Discharge: 2016-01-19 | Disposition: A | Discharge status: Home / Self Care | Payer: Medicare Other | Source: Ambulatory Visit | Attending: Radiation Oncology | Admitting: Radiation Oncology

## 2016-01-19 DIAGNOSIS — Z510 Encounter for antineoplastic radiation therapy: Secondary | ICD-10-CM | POA: Diagnosis not present

## 2016-01-22 ENCOUNTER — Ambulatory Visit
Admission: RE | Admit: 2016-01-22 | Discharge: 2016-01-22 | Disposition: A | Discharge status: Home / Self Care | Payer: Medicare Other | Source: Ambulatory Visit | Attending: Radiation Oncology | Admitting: Radiation Oncology

## 2016-01-22 DIAGNOSIS — Z510 Encounter for antineoplastic radiation therapy: Secondary | ICD-10-CM | POA: Diagnosis not present

## 2016-01-24 ENCOUNTER — Ambulatory Visit: Payer: Medicare Other

## 2016-01-24 ENCOUNTER — Encounter: Payer: Self-pay | Admitting: Radiation Oncology

## 2016-01-24 ENCOUNTER — Ambulatory Visit
Admission: RE | Admit: 2016-01-24 | Discharge: 2016-01-24 | Disposition: A | Discharge status: Home / Self Care | Payer: Medicare Other | Source: Ambulatory Visit | Attending: Radiation Oncology | Admitting: Radiation Oncology

## 2016-01-24 ENCOUNTER — Ambulatory Visit (HOSPITAL_BASED_OUTPATIENT_CLINIC_OR_DEPARTMENT_OTHER): Payer: Medicare Other

## 2016-01-24 ENCOUNTER — Telehealth: Payer: Self-pay

## 2016-01-24 DIAGNOSIS — Z510 Encounter for antineoplastic radiation therapy: Secondary | ICD-10-CM | POA: Diagnosis not present

## 2016-01-24 DIAGNOSIS — C782 Secondary malignant neoplasm of pleura: Secondary | ICD-10-CM

## 2016-01-24 DIAGNOSIS — C7951 Secondary malignant neoplasm of bone: Secondary | ICD-10-CM

## 2016-01-24 DIAGNOSIS — C50919 Malignant neoplasm of unspecified site of unspecified female breast: Secondary | ICD-10-CM

## 2016-01-24 DIAGNOSIS — C50911 Malignant neoplasm of unspecified site of right female breast: Secondary | ICD-10-CM

## 2016-01-24 DIAGNOSIS — R300 Dysuria: Secondary | ICD-10-CM

## 2016-01-24 LAB — COMPREHENSIVE METABOLIC PANEL
ALBUMIN: 3.9 g/dL (ref 3.5–5.0)
ALK PHOS: 76 U/L (ref 40–150)
ALT: 19 U/L (ref 0–55)
AST: 29 U/L (ref 5–34)
Anion Gap: 10 mEq/L (ref 3–11)
BUN: 22 mg/dL (ref 7.0–26.0)
CO2: 20 mEq/L — ABNORMAL LOW (ref 22–29)
Calcium: 10.6 mg/dL — ABNORMAL HIGH (ref 8.4–10.4)
Chloride: 109 mEq/L (ref 98–109)
Creatinine: 1 mg/dL (ref 0.6–1.1)
EGFR: 56 mL/min/{1.73_m2} — ABNORMAL LOW (ref >90)
GLUCOSE: 102 mg/dL (ref 70–140)
POTASSIUM: 3.8 meq/L (ref 3.5–5.1)
SODIUM: 140 meq/L (ref 136–145)
TOTAL PROTEIN: 7.4 g/dL (ref 6.4–8.3)
Total Bilirubin: 1.3 mg/dL — ABNORMAL HIGH (ref 0.20–1.20)

## 2016-01-24 LAB — CBC WITH DIFFERENTIAL/PLATELET
BASO%: 0.6 % (ref 0.0–2.0)
Basophils Absolute: 0 10*3/uL (ref 0.0–0.1)
EOS%: 5.7 % (ref 0.0–7.0)
Eosinophils Absolute: 0.3 10*3/uL (ref 0.0–0.5)
HCT: 39.1 % (ref 34.8–46.6)
HEMOGLOBIN: 13.7 g/dL (ref 11.6–15.9)
LYMPH%: 19.8 % (ref 14.0–49.7)
MCH: 29.8 pg (ref 25.1–34.0)
MCHC: 35 g/dL (ref 31.5–36.0)
MCV: 85.2 fL (ref 79.5–101.0)
MONO#: 0.8 10*3/uL (ref 0.1–0.9)
MONO%: 15.2 % — AB (ref 0.0–14.0)
NEUT%: 58.7 % (ref 38.4–76.8)
NEUTROS ABS: 2.9 10*3/uL (ref 1.5–6.5)
Platelets: 211 10*3/uL (ref 145–400)
RBC: 4.59 10*6/uL (ref 3.70–5.45)
RDW: 15.5 % — AB (ref 11.2–14.5)
WBC: 4.9 10*3/uL (ref 3.9–10.3)
lymph#: 1 10*3/uL (ref 0.9–3.3)

## 2016-01-24 MED ORDER — TRAMADOL HCL 50 MG PO TABS: 50.0000 mg | ORAL_TABLET | ORAL | Status: DC | PRN

## 2016-01-24 MED FILL — traMADol HCL 50 MG TABS: 50 | 2 days supply | Qty: 15 | Fill #0

## 2016-01-24 NOTE — Telephone Encounter (Signed)
-----   Message from Gordy Levan, MD sent at 01/24/2016  3:41 PM EDT ----- Labs seen and need follow up: please let her know CBC and CMET ok for starting xeloda as planned   thanks

## 2016-01-24 NOTE — Telephone Encounter (Signed)
S/w pt per LL ok to start xeloda as planned. Pt was amenable. She also requested tramadol r/f - phoned to [facility] outpatient pharmacy.

## 2016-01-25 ENCOUNTER — Ambulatory Visit: Payer: Medicare Other

## 2016-01-25 MED FILL — ONDANSETRON HCL 8 MG TABLET: 8 | 6 days supply | Qty: 20 | Fill #1

## 2016-01-26 ENCOUNTER — Ambulatory Visit: Payer: Medicare Other

## 2016-01-29 ENCOUNTER — Ambulatory Visit: Payer: Medicare Other

## 2016-01-30 ENCOUNTER — Ambulatory Visit: Payer: Medicare Other

## 2016-01-31 ENCOUNTER — Ambulatory Visit: Payer: Medicare Other

## 2016-02-02 NOTE — Progress Notes (Addendum)
°  Radiation Oncology         [phone]) [phone] ________________________________  Name: Lisa LEHNERT MRN: [id number]  Date: 01/24/2016  DOB: 03-06-1941  End of Treatment Note    ICD-9-CM ICD-10-CM   1. Bone metastases (HCC) 198.5 C79.51     DIAGNOSIS: Metastatic breast cancer with osseous metastasis     Indication for treatment:  Palliation, pain control       Radiation treatment dates:  01/17/2016-01/24/2016  Site/dose:  Right femur to 20 Gy in 5 fractions.  Beams/energy:   Complex isodose plan, 15x, ap/pa  Narrative: The patient tolerated radiation treatment relatively well.   Patient had improvement in her right upper leg pain particularly with standing  Plan: The patient has completed radiation treatment. The patient will return to radiation oncology clinic for routine followup in one month. I advised them to call or return sooner if they have any questions or concerns related to their recovery or treatment.  -----------------------------------  Blair Promise, PhD, MD

## 2016-02-04 ENCOUNTER — Other Ambulatory Visit: Payer: Self-pay | Admitting: Oncology

## 2016-02-04 DIAGNOSIS — C50911 Malignant neoplasm of unspecified site of right female breast: Secondary | ICD-10-CM

## 2016-02-05 ENCOUNTER — Encounter: Payer: Self-pay | Admitting: Oncology

## 2016-02-05 ENCOUNTER — Ambulatory Visit (HOSPITAL_BASED_OUTPATIENT_CLINIC_OR_DEPARTMENT_OTHER): Payer: Medicare Other | Admitting: Oncology

## 2016-02-05 ENCOUNTER — Ambulatory Visit (HOSPITAL_BASED_OUTPATIENT_CLINIC_OR_DEPARTMENT_OTHER): Payer: Medicare Other

## 2016-02-05 ENCOUNTER — Telehealth: Payer: Self-pay | Admitting: Oncology

## 2016-02-05 ENCOUNTER — Other Ambulatory Visit: Payer: Self-pay | Admitting: Oncology

## 2016-02-05 VITALS — BP 147/81 | HR 84 | Temp 98.1°F | Resp 18 | Ht 63.0 in | Wt 169.9 lb

## 2016-02-05 DIAGNOSIS — C7951 Secondary malignant neoplasm of bone: Secondary | ICD-10-CM | POA: Diagnosis not present

## 2016-02-05 DIAGNOSIS — C50911 Malignant neoplasm of unspecified site of right female breast: Secondary | ICD-10-CM

## 2016-02-05 DIAGNOSIS — I10 Essential (primary) hypertension: Secondary | ICD-10-CM

## 2016-02-05 DIAGNOSIS — R350 Frequency of micturition: Secondary | ICD-10-CM

## 2016-02-05 DIAGNOSIS — Z5111 Encounter for antineoplastic chemotherapy: Secondary | ICD-10-CM

## 2016-02-05 DIAGNOSIS — C782 Secondary malignant neoplasm of pleura: Secondary | ICD-10-CM | POA: Diagnosis not present

## 2016-02-05 LAB — COMPREHENSIVE METABOLIC PANEL
ALT: 17 U/L (ref 0–55)
AST: 28 U/L (ref 5–34)
Albumin: 3.9 g/dL (ref 3.5–5.0)
Alkaline Phosphatase: 80 U/L (ref 40–150)
Anion Gap: 10 mEq/L (ref 3–11)
BUN: 17.5 mg/dL (ref 7.0–26.0)
CHLORIDE: 110 meq/L — AB (ref 98–109)
CO2: 20 meq/L — AB (ref 22–29)
CREATININE: 0.9 mg/dL (ref 0.6–1.1)
Calcium: 10.4 mg/dL (ref 8.4–10.4)
EGFR: 62 mL/min/{1.73_m2} — ABNORMAL LOW (ref >90)
GLUCOSE: 108 mg/dL (ref 70–140)
Potassium: 3.7 mEq/L (ref 3.5–5.1)
SODIUM: 140 meq/L (ref 136–145)
Total Bilirubin: 0.96 mg/dL (ref 0.20–1.20)
Total Protein: 7.3 g/dL (ref 6.4–8.3)

## 2016-02-05 LAB — URINALYSIS, MICROSCOPIC - CHCC
Bilirubin (Urine): NEGATIVE
GLUCOSE UR CHCC: NEGATIVE mg/dL
KETONES: NEGATIVE mg/dL
Nitrite: POSITIVE
PROTEIN: NEGATIVE mg/dL
RBC / HPF: NEGATIVE (ref 0–2)
Specific Gravity, Urine: 1.01 (ref 1.003–1.035)
UROBILINOGEN UR: 0.2 mg/dL (ref 0.2–1)
pH: 6 (ref 4.6–8.0)

## 2016-02-05 LAB — CBC WITH DIFFERENTIAL/PLATELET
BASO%: 1.2 % (ref 0.0–2.0)
Basophils Absolute: 0.1 10*3/uL (ref 0.0–0.1)
EOS%: 6.5 % (ref 0.0–7.0)
Eosinophils Absolute: 0.3 10*3/uL (ref 0.0–0.5)
HCT: 39.2 % (ref 34.8–46.6)
HGB: 13.5 g/dL (ref 11.6–15.9)
LYMPH%: 16.8 % (ref 14.0–49.7)
MCH: 30.1 pg (ref 25.1–34.0)
MCHC: 34.4 g/dL (ref 31.5–36.0)
MCV: 87.7 fL (ref 79.5–101.0)
MONO#: 0.6 10*3/uL (ref 0.1–0.9)
MONO%: 12.1 % (ref 0.0–14.0)
NEUT#: 3.1 10*3/uL (ref 1.5–6.5)
NEUT%: 63.4 % (ref 38.4–76.8)
Platelets: 203 10*3/uL (ref 145–400)
RBC: 4.46 10*6/uL (ref 3.70–5.45)
RDW: 16.3 % — ABNORMAL HIGH (ref 11.2–14.5)
WBC: 4.9 10*3/uL (ref 3.9–10.3)
lymph#: 0.8 10*3/uL — ABNORMAL LOW (ref 0.9–3.3)

## 2016-02-05 MED ORDER — CAPECITABINE 500 MG PO TABS: 625.0000 mg/m2 | ORAL_TABLET | Freq: Two times a day (BID) | ORAL | Status: DC

## 2016-02-05 MED ORDER — ZOLEDRONIC ACID 4 MG/100ML IV SOLN
4.0000 mg | Freq: Once | INTRAVENOUS | Status: AC
  Administered 2016-02-05: 4 mg via INTRAVENOUS
  Filled 2016-02-05: qty 100

## 2016-02-05 MED FILL — CAPECITABINE 500 MG TABLET: 500 | 14 days supply | Qty: 56 | Fill #0

## 2016-02-05 NOTE — Telephone Encounter (Signed)
appt made and avs printed °

## 2016-02-05 NOTE — Patient Instructions (Signed)

## 2016-02-05 NOTE — Progress Notes (Signed)
Pt provided RN urine sample. Will send off to lab and pt will get a call if results are abnormal. Pt verbalized understanding and will call if there are any further concerns. [name]

## 2016-02-05 NOTE — Progress Notes (Signed)
OFFICE PROGRESS NOTE   February 06, 2016   Physicians: J.Kinard, [name], R.Donneta Romberg, A.Kendall. 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 She has had good improvement in right hip pain since radiation recently completed there. She began cycle 2 xeloda on 01-26-16, planned for 14 days every 21 days. She is due zometa today. She has follow up with Dr Sondra Come 02-29-16.   Patient is more easily mobile and feels gait is essentially back to normal since radiation to right hip. She has some new pain right upper arm at ~ insertion of deltoid, which she feels is related to using cane. She denies back pain now. She has been more tired, but is also up 4-6 times a night to void despite not pushing po fluids beyond about 6:30 PM. She seems to have some urinary urgency, tho no frank dysuria and no fever, note E coli UTI 09-2015. Appetite is good, no nausea. She has had small ulcer on lateral posterior left tongue, using OTC mouthwash only but will try salt water and topical dental paste (which she has at home, type not clear to me). She has no significant cough and is not more SOB. BPs mostly 120-130/ 74-80, occasionally diastolic 90. She had brief palpitations on 6-30 with HR 115, not recurrent. NOTE she is off cozaar since 01-03-16 for possible interaction with xeloda. Energy seems improved. No bleeding.  Remainder of 10 point Review of Systems negative.  No PAC No genetics testing  ONCOLOGIC HISTORY The right breast carcinoma initially was T2 N1, ER/PR-positive in December 1998, treated with mastectomy with 12 node axillary evaluation and adjuvant Adriamycin/Cytoxan followed by 5 years of tamoxifen through December 2003. 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 CA 2729  was 434 in April 2010. She has tolerated Femara well and clinically has continued to do very well, though CA 27.29 has never normalized. 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 Patient seen by medical oncology 08-10-15 and radiation begun shortly afterwards by Dr Sondra Come, 22.5 of planned 35 Gy radiation to area of T10 thru 08-29-15. She had first zometa 08-11-15. MRI T spine obtained after some difficulty on 08-29-15, with pathologic compression fracture T10 with retropulsion of compressed vertebra to right with compression of cord called, involvement also T9, T11, L2. She had thoracic six- lumbar one posterolateral arthrodesis, T10 laminectomy, partial laminectomy T9, T11 by Dr Christella Noa on 09-01-15. She was begun on Aromasin on 09-07-15.  PET had been scheduled for 09-04-15, delayed with surgery, accomplished on 09-20-15 with chest adenopathy, increased soft tissue right lung base, extensive bone involvement.  Repeat PET 12-22-15 some improvement in area of radiation T spine, otherwise no improvement in bones, some increase right femur, some central chest adenopathy and right lower chest/ pleural involvement. Aromasin was discontinued and xeloda begun 01-05-16.     Objective:  Vital signs in last 24 hours:  BP 147/81 mmHg  Pulse 84  Temp(Src) 98.1 F (36.7 C) (Oral)  Resp 18  Ht '5\' 3"'$  (1.6 m)  Wt 169 lb 14.4 oz (77.066 kg)  BMI 30.10 kg/m2  SpO2 100% Weight down 1 lb. Alert, oriented and appropriate. Ambulatory more quickly and easily in exam room today, not using cane or walker.  Overall looks best today that I have seen her in months. No alopecia  HEENT:PERRL, sclerae not icteric. Oral mucosa moist, single 2 mm aphthous type ulcer left lateral tongue, posterior pharynx clear.  Neck supple. No JVD.  Lymphatics:no cervical,supraclavicular, axillary or inguinal adenopathy Resp: clear to auscultation bilaterally and normal percussion  bilaterally Cardio: regular rate and rhythm. No gallop. GI: soft, nontender, not distended, no mass or organomegaly. Normally active bowel sounds.  Musculoskeletal/ Extremities: UE/ LE without pitting edema, cords, tenderness Neuro: no peripheral neuropathy. Otherwise nonfocal. PSYCH brighter affect, joking and talkative Skin without rash, ecchymosis, petechiae   Lab Results:  Results for orders placed or performed in visit on 02/05/16  CBC with Differential  Result Value Ref Range   WBC 4.9 3.9 - 10.3 10e3/uL   NEUT# 3.1 1.5 - 6.5 10e3/uL   HGB 13.5 11.6 - 15.9 g/dL   HCT 39.2 34.8 - 46.6 %   Platelets 203 145 - 400 10e3/uL   MCV 87.7 79.5 - 101.0 fL   MCH 30.1 25.1 - 34.0 pg   MCHC 34.4 31.5 - 36.0 g/dL   RBC 4.46 3.70 - 5.45 10e6/uL   RDW 16.3 (H) 11.2 - 14.5 %   lymph# 0.8 (L) 0.9 - 3.3 10e3/uL   MONO# 0.6 0.1 - 0.9 10e3/uL   Eosinophils Absolute 0.3 0.0 - 0.5 10e3/uL   Basophils Absolute 0.1 0.0 - 0.1 10e3/uL   NEUT% 63.4 38.4 - 76.8 %   LYMPH% 16.8 14.0 - 49.7 %   MONO% 12.1 0.0 - 14.0 %   EOS% 6.5 0.0 - 7.0 %   BASO% 1.2 0.0 - 2.0 %  Comprehensive metabolic panel  Result Value Ref Range   Sodium 140 136 - 145 mEq/L   Potassium 3.7 3.5 - 5.1 mEq/L   Chloride 110 (H) 98 - 109 mEq/L   CO2 20 (L) 22 - 29 mEq/L   Glucose 108 70 - 140 mg/dl   BUN 17.5 7.0 - 26.0 mg/dL   Creatinine 0.9 0.6 - 1.1 mg/dL   Total Bilirubin 0.96 0.20 - 1.20 mg/dL   Alkaline Phosphatase 80 40 - 150 U/L   AST 28 5 - 34 U/L   ALT 17 0 - 55 U/L   Total Protein 7.3 6.4 - 8.3 g/dL   Albumin 3.9 3.5 - 5.0 g/dL   Calcium 10.4 8.4 - 10.4 mg/dL   Anion Gap 10 3 - 11 mEq/L   EGFR 62 (L) >90 ml/min/1.73 m2  Cancer antigen 27.29  Result Value Ref Range   CA 27.29 260.2 (H) 0.0 - 38.6 U/mL  CA 15.3  Result Value Ref Range   CA 15-3 237.1 (H) 0.0 - 25.0 U/mL  Urinalysis with microscopic  Result Value Ref Range   Glucose Negative Negative mg/dL   Bilirubin (Urine) Negative Negative   Ketones  Negative Negative mg/dL   Specific Gravity, Urine 1.010 1.003 - 1.035   Blood Trace Negative   pH 6.0 4.6 - 8.0   Protein Negative Negative- <30 mg/dL   Urobilinogen, UR 0.2 0.2 - 1 mg/dL   Nitrite Positive Negative   Leukocyte Esterase Moderate Negative   RBC / HPF Negative 0 - 2   WBC, UA 21-50 with clumps 0 - 2   Bacteria, UA Many Negative- Trace   Epithelial Cells Few Negative- Few  UA available after visit, culture pending  Chemistries reviewed at visit, calcium normal now  CA 2729 available after visit, had been 217 on 12-07-15, first cycle xeloda  begun 01-05-16 CA 15-3 available after visit, had been 240 on 12-07-15 and 251 on 10-12-15, first cycle xeloda as above  Studies/Results: PET 12-22-15 no uptake in right humerus where new discomfort recently  Medications: I have reviewed the patient's current medications. Xeloda dose begun at 1000 mg bid.  DISCUSSION Patient is pleased with improvement since radiation to right hip area, understands that she may get more benefit out for 4 weeks from completion. She feels xeloda is tolerable and is in agreement with continuing for now, to complete present cycle 2 on 02-08-16 and cycle 3 due to start 02-16-16.  WIll check labs on 7-27 and let her know if ok to begin xeloda 7-28 ( ok if ANC .=1.5 and plt >=100k). I will see her at least with next zometa on 03-04-16.   Will have zometa today as planned.   Fatigue likely at least in part from needing to void multiple times at night. UA available after visit suspicious for UTI so will begin empiric nitrofurantoin and follow up culture (note C diff previously so will avoid quinolones if possible.  Assessment/Plan:  1.progressive metastatic breast cancer now involving right lung base/ pleura, central chest adenopathy, and extensive bone involvement: pathologic compression fracture T10 and retropulsion of bone impinging on spinal cord for which she had T6 - L1 posterolateral arthrodesis with T10  laminectomy and T9/ T11 partial laminectomy by Dr Christella Noa on 09-01-15.  No significant improvement on Aromasin x 4 months, DC 01-02-16. Oral xeloda 1000 mg bid x 14 days every 21 days, cycle 1  6-16 thru 01-18-16. Continue monthly zometa. Radiation to right hip/ right femur completed and clearly helpful. Continue xeloda.  2.post partial course of radiation thru 08-29-15 to T10 region, correlate with area of some improvement on PET. 3.hypercalcemia and metastatic bone involvement: calcium not elevated today. Other electrolyte supplements around zometa since she cannot tolerate gatorade. 4.Potential adverse interaction xeloda and cozaar, cozaar DCd on 01-03-16. BPs good, watching closely at home  5.diarrhea Jan treated for C diff ( Ab + toxin -) and resolved.  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  8.Advance Directives in place 9.weight nealy stable and appetite better, follow  All questions answered; we will be in touch with her about apparent UTI. Zometa orders confirmed. Message to collaborative RN re labs 7-27. Time spent 30 min including >50% counseling and coordination of care. Route PCP, cc Dr Sondra Come   Evlyn Clines, MD   02/06/2016, 4:11 PM

## 2016-02-06 ENCOUNTER — Telehealth: Payer: Self-pay

## 2016-02-06 DIAGNOSIS — N390 Urinary tract infection, site not specified: Secondary | ICD-10-CM

## 2016-02-06 LAB — CANCER ANTIGEN 27.29: CA 27.29: 260.2 U/mL — ABNORMAL HIGH (ref 0.0–38.6)

## 2016-02-06 LAB — CANCER ANTIGEN 15-3: CAN 15 3: 237.1 U/mL — AB (ref 0.0–25.0)

## 2016-02-06 MED ORDER — NITROFURANTOIN MONOHYD MACRO 100 MG PO CAPS: 100.0000 mg | ORAL_CAPSULE | Freq: Two times a day (BID) | ORAL | Status: DC

## 2016-02-06 MED FILL — NITROFURANTOIN MONO-MCR 100: 100 | 7 days supply | Qty: 14 | Fill #0

## 2016-02-06 NOTE — Telephone Encounter (Signed)
Told Lisa [name] that the urinalysis looks suspious for a UTI.  Will send in ATB as noted below by Dr. Marko Plume and f/u on culture

## 2016-02-06 NOTE — Telephone Encounter (Signed)
-----   Message from Gordy Levan, MD sent at 02/05/2016  7:57 PM EDT ----- Labs seen and need follow up: please send script for macrobid 100 mg bid x 7 days. She was not febrile and will not go out to get script tonight, so in AM is fine.  WIll need to watch for culture results     thanks

## 2016-02-07 ENCOUNTER — Telehealth: Payer: Self-pay

## 2016-02-07 ENCOUNTER — Other Ambulatory Visit: Payer: Self-pay | Admitting: Oncology

## 2016-02-07 DIAGNOSIS — N390 Urinary tract infection, site not specified: Secondary | ICD-10-CM

## 2016-02-07 LAB — URINE CULTURE

## 2016-02-07 MED ORDER — SULFAMETHOXAZOLE-TRIMETHOPRIM 800-160 MG PO TABS: 1.0000 | ORAL_TABLET | Freq: Two times a day (BID) | ORAL | Status: DC

## 2016-02-07 MED FILL — SULFAMETHOXAZOLE-TMP DS TAB: 800-160 | 5 days supply | Qty: 10 | Fill #0

## 2016-02-07 NOTE — Telephone Encounter (Signed)
-----   Message from Gordy Levan, MD sent at 02/07/2016  2:32 PM EDT ----- Please change macrobid to Bactrim DS one tab every 12 hrs for 5 days.  (Bactrim not listed on allergies, but if she cannot take it will use Augmentin 500 mg q 12 hrs with food instead)  Will recheck UA C& S with other labs on 7-27 to be sure infection is cleared - order in ,RN please watch for that information  She should just hold the last day or so of xeloda if not finished, as Xeloda does not go well with Bactrim. If she took xeloda this AM, ok to start Bactrim this PM  thanks

## 2016-02-07 NOTE — Telephone Encounter (Signed)
Spoke with Lisa [name] and told her to stop Xeloda.(only 3 tabs left for current cycle.  She has not had Bactrim previously that she is aware of. Gave her the information about follow up of UTI as noted below by Dr. Marko Plume.

## 2016-02-08 ENCOUNTER — Encounter: Payer: Self-pay | Admitting: Radiation Oncology

## 2016-02-15 ENCOUNTER — Telehealth: Payer: Self-pay

## 2016-02-15 ENCOUNTER — Other Ambulatory Visit: Payer: Self-pay | Admitting: Oncology

## 2016-02-15 ENCOUNTER — Other Ambulatory Visit (HOSPITAL_BASED_OUTPATIENT_CLINIC_OR_DEPARTMENT_OTHER): Payer: Medicare Other

## 2016-02-15 DIAGNOSIS — C50911 Malignant neoplasm of unspecified site of right female breast: Secondary | ICD-10-CM

## 2016-02-15 DIAGNOSIS — N390 Urinary tract infection, site not specified: Secondary | ICD-10-CM

## 2016-02-15 LAB — URINALYSIS, MICROSCOPIC - CHCC
BILIRUBIN (URINE): NEGATIVE
GLUCOSE UR CHCC: NEGATIVE mg/dL
Ketones: NEGATIVE mg/dL
NITRITE: NEGATIVE
PH: 6 (ref 4.6–8.0)
Protein: <30 mg/dL
Specific Gravity, Urine: 1.015 (ref 1.003–1.035)
UROBILINOGEN UR: 0.2 mg/dL (ref 0.2–1)

## 2016-02-15 LAB — CBC WITH DIFFERENTIAL/PLATELET
BASO%: 1.3 % (ref 0.0–2.0)
Basophils Absolute: 0.1 10*3/uL (ref 0.0–0.1)
EOS%: 7.8 % — ABNORMAL HIGH (ref 0.0–7.0)
Eosinophils Absolute: 0.4 10*3/uL (ref 0.0–0.5)
HEMATOCRIT: 40.5 % (ref 34.8–46.6)
HGB: 14.4 g/dL (ref 11.6–15.9)
LYMPH%: 22 % (ref 14.0–49.7)
MCH: 31 pg (ref 25.1–34.0)
MCHC: 35.6 g/dL (ref 31.5–36.0)
MCV: 87.1 fL (ref 79.5–101.0)
MONO#: 0.5 10*3/uL (ref 0.1–0.9)
MONO%: 11.7 % (ref 0.0–14.0)
NEUT#: 2.6 10*3/uL (ref 1.5–6.5)
NEUT%: 57.2 % (ref 38.4–76.8)
Platelets: 190 10*3/uL (ref 145–400)
RBC: 4.65 10*6/uL (ref 3.70–5.45)
RDW: 16 % — ABNORMAL HIGH (ref 11.2–14.5)
WBC: 4.5 10*3/uL (ref 3.9–10.3)
lymph#: 1 10*3/uL (ref 0.9–3.3)
nRBC: 0 % (ref 0–0)

## 2016-02-15 LAB — COMPREHENSIVE METABOLIC PANEL
ALT: 25 U/L (ref 0–55)
ANION GAP: 9 meq/L (ref 3–11)
AST: 35 U/L — AB (ref 5–34)
Albumin: 4 g/dL (ref 3.5–5.0)
Alkaline Phosphatase: 100 U/L (ref 40–150)
BUN: 17 mg/dL (ref 7.0–26.0)
CALCIUM: 10.6 mg/dL — AB (ref 8.4–10.4)
CHLORIDE: 107 meq/L (ref 98–109)
CO2: 22 mEq/L (ref 22–29)
Creatinine: 1 mg/dL (ref 0.6–1.1)
EGFR: 55 mL/min/{1.73_m2} — AB (ref >90)
Glucose: 95 mg/dl (ref 70–140)
POTASSIUM: 3.8 meq/L (ref 3.5–5.1)
Sodium: 138 mEq/L (ref 136–145)
Total Bilirubin: 1.21 mg/dL — ABNORMAL HIGH (ref 0.20–1.20)
Total Protein: 7.6 g/dL (ref 6.4–8.3)

## 2016-02-15 NOTE — Telephone Encounter (Signed)
Pt returned call and stated she was not hurting down in front like she was. She has a tad of pain in her back at waistline. S/w Dr Marko Plume and instructed pt to start her xeloda tomorrow as planned. Pt agreed and informed this RN that she is drinking 3 20 oz bottles of water per day.

## 2016-02-15 NOTE — Telephone Encounter (Signed)
LVM CBC good, UA improved but not fully. Is she still having any UTI symptoms? If so we will need to do another round of antibiotics. Please call back.

## 2016-02-19 LAB — URINE CULTURE

## 2016-02-20 ENCOUNTER — Telehealth: Payer: Self-pay

## 2016-02-20 ENCOUNTER — Telehealth: Payer: Self-pay | Admitting: Oncology

## 2016-02-20 ENCOUNTER — Other Ambulatory Visit: Payer: Self-pay | Admitting: Oncology

## 2016-02-20 DIAGNOSIS — N390 Urinary tract infection, site not specified: Secondary | ICD-10-CM

## 2016-02-20 MED ORDER — NITROFURANTOIN MONOHYD MACRO 100 MG PO CAPS: 100.0000 mg | ORAL_CAPSULE | Freq: Two times a day (BID) | ORAL | 0 refills | Status: DC

## 2016-02-20 MED FILL — NITROFURANTOIN MONO-MCR 100: 100 | 7 days supply | Qty: 14 | Fill #0

## 2016-02-20 NOTE — Telephone Encounter (Signed)
lvm to inform pt of 8/10 lab appt per [facility] pof

## 2016-02-20 NOTE — Telephone Encounter (Signed)
LM for Ms Sperbeck noting the information below about Urine culture and change in ATB for UTI.

## 2016-02-20 NOTE — Telephone Encounter (Signed)
Ms. Dileonardo called and stated she she received message and will pick up ATB today. She will verify with [facility] that there is not contraindication with Macrobid and Xeloda.  If there is a contraindication, she will take Xeloda tonight and begin Macrobid in am and stop the Xeloda.  She wants to at least get a full 5 days of Xeloda.

## 2016-02-20 NOTE — Telephone Encounter (Signed)
-----   Message from Gordy Levan, MD sent at 02/20/2016  7:49 AM EDT ----- Labs seen and need follow up:  Please let patient know that the UTI is not sensitive to Bactrim that she has used. Please change to macrobid 100 mg bid x 7 days. When she comes to see Dr Sondra Come on 8-10 she needs repeat UA C&S to be sure cleared. I have put in POF and lab orders for 8-10.  thanks

## 2016-02-21 MED FILL — SIMVASTATIN 20 MG TABLET: 20 | 90 days supply | Qty: 90 | Fill #0

## 2016-02-21 NOTE — Telephone Encounter (Signed)
Lisa [name] called and stated that the pharmacist at [name] outpatient pharmacy said that she can take Macrobid with Xeloda.

## 2016-02-26 ENCOUNTER — Encounter: Payer: Self-pay | Admitting: Oncology

## 2016-02-29 ENCOUNTER — Other Ambulatory Visit: Payer: Self-pay | Admitting: *Deleted

## 2016-02-29 ENCOUNTER — Ambulatory Visit
Admission: RE | Admit: 2016-02-29 | Discharge: 2016-02-29 | Disposition: A | Discharge status: Home / Self Care | Payer: Medicare Other | Source: Ambulatory Visit | Attending: Radiation Oncology | Admitting: Radiation Oncology

## 2016-02-29 ENCOUNTER — Telehealth: Payer: Self-pay

## 2016-02-29 ENCOUNTER — Other Ambulatory Visit: Payer: Medicare Other

## 2016-02-29 ENCOUNTER — Encounter: Payer: Self-pay | Admitting: Radiation Oncology

## 2016-02-29 VITALS — BP 143/84 | HR 79 | Temp 98.8°F | Ht 63.0 in | Wt 165.2 lb

## 2016-02-29 DIAGNOSIS — C7951 Secondary malignant neoplasm of bone: Secondary | ICD-10-CM

## 2016-02-29 DIAGNOSIS — R309 Painful micturition, unspecified: Secondary | ICD-10-CM

## 2016-02-29 DIAGNOSIS — Y842 Radiological procedure and radiotherapy as the cause of abnormal reaction of the patient, or of later complication, without mention of misadventure at the time of the procedure: Secondary | ICD-10-CM | POA: Diagnosis not present

## 2016-02-29 DIAGNOSIS — N390 Urinary tract infection, site not specified: Secondary | ICD-10-CM

## 2016-02-29 DIAGNOSIS — C50911 Malignant neoplasm of unspecified site of right female breast: Secondary | ICD-10-CM | POA: Insufficient documentation

## 2016-02-29 LAB — URINALYSIS, MICROSCOPIC - CHCC
Bilirubin (Urine): NEGATIVE
Glucose: NEGATIVE mg/dL
KETONES: NEGATIVE mg/dL
Nitrite: POSITIVE
SPECIFIC GRAVITY, URINE: 1.015 (ref 1.003–1.035)
Urobilinogen, UR: 0.2 mg/dL (ref 0.2–1)
pH: 6 (ref 4.6–8.0)

## 2016-02-29 MED ORDER — CIPROFLOXACIN HCL 250 MG PO TABS: 250.0000 mg | ORAL_TABLET | Freq: Two times a day (BID) | ORAL | 0 refills | Status: DC

## 2016-02-29 MED FILL — CIPROFLOXACIN HCL 250 MG TA: 250 | 3 days supply | Qty: 6 | Fill #0

## 2016-02-29 NOTE — Progress Notes (Signed)
Lisa [name] is here for follow up.  She denies having pain.  She does report that her right leg limps when she gets tired.  She is using a cane.  She is taking Xeloda and has completed 3 rounds.  She reports having a UTI and has been on 2 different antibiotics.  She reports having pain in her right groin when urinating.  Her urine was checked again today.  She denies having any skin irritation.  She reports having fatigue.  BP (!) 143/84 (BP Location: Left Arm, Patient Position: Sitting)   Pulse 79   Temp 98.8 F (37.1 C) (Oral)   Ht 5\' 3"  (1.6 m)   Wt 165 lb 3.2 oz (74.9 kg)   SpO2 99%   BMI 29.26 kg/m    Wt Readings from Last 3 Encounters:  02/29/16 165 lb 3.2 oz (74.9 kg)  02/05/16 169 lb 14.4 oz (77.1 kg)  01/18/16 171 lb 9.6 oz (77.8 kg)

## 2016-02-29 NOTE — Progress Notes (Signed)
Radiation Oncology         [phone]) [phone] ________________________________  Name: Lisa [name] MRN: [id number]  Date: 02/29/2016  DOB: January 12, 1941  Follow-Up Visit Note  CC: [name], MD  Gordy Levan, MD    ICD-9-CM ICD-10-CM   1. Breast cancer metastasized to bone, right (HCC) 174.9 C50.911    198.5 C79.51   2. Bone metastases (HCC) 198.5 C79.51       Diagnosis:   Metastatic breast cancer with osseous metastasis   Interval Since Last Radiation:  01/17/2016-01/24/2016, about one month,  directed at the right proximal and mid-femur   Narrative:  The patient returns today for routine one month follow up. She denies having pain.  She does report that her right leg limps when she gets tired.  She is using a cane.  She is taking Xeloda and has completed 3 rounds.  She reports having a UTI and has been on 2 different antibiotics.  She reports having pain in her right groin when urinating.  Her urine was checked again today.  She denies having any skin irritation.  She reports having fatigue. The patient reports that the cane is causing some pain in her right arm, but is treating this with an ice pack. Patient denies taking any pain medication. Patient reports that she is unable to do core exercises because she experiences pain in her thoracic spine to her lumbar spine, laterally (as described by the patient).                           ALLERGIES:  is allergic to diphenhydramine hcl; codeine sulfate; oseltamivir phosphate; oxycodone-acetaminophen; prednisone; and sudafed [pseudoephedrine hcl].  Meds: Current Outpatient Prescriptions  Medication Sig Dispense Refill  . acetaminophen (TYLENOL) 325 MG tablet Take 325 mg by mouth every 6 (six) hours as needed. Reported on 01/10/2016    . capecitabine (XELODA) 500 MG tablet Take 2 tablets (1,000 mg total) by mouth 2 (two) times daily after a meal. For 14 days. Every 21 days. 56 tablet 0  . loratadine (CLARITIN) 10 MG tablet Take 10  mg by mouth daily.     . Potassium 99 MG TABS Take 1 tablet by mouth daily.    . simvastatin (ZOCOR) 20 MG tablet Take 20 mg by mouth at bedtime.      . vitamin B-12 (CYANOCOBALAMIN) 1000 MCG tablet Take 1,000 mcg by mouth 2 (two) times daily.    . vitamin C (ASCORBIC ACID) 500 MG tablet Take 500 mg by mouth daily.      . vitamin E 200 UNIT capsule Take 200 Units by mouth daily.      . ferrous sulfate 325 (65 FE) MG tablet Take 325 mg by mouth 2 (two) times a week. Reported on 02/05/2016    . Multiple Vitamins-Minerals (MULTIVITAMIN WITH MINERALS) tablet Take 1 tablet by mouth daily. Reported on 02/05/2016    . nitrofurantoin, macrocrystal-monohydrate, (MACROBID) 100 MG capsule Take 1 capsule (100 mg total) by mouth 2 (two) times daily. X 7 days for UTI (Patient not taking: Reported on 02/29/2016) 14 capsule 0  . ondansetron (ZOFRAN) 8 MG tablet Take 1 tablet (8 mg total) by mouth every 8 (eight) hours as needed for nausea or vomiting. (Patient not taking: Reported on 02/29/2016) 20 tablet 2  . sulfamethoxazole-trimethoprim (BACTRIM DS,SEPTRA DS) 800-160 MG tablet Take 1 tablet by mouth every 12 (twelve) hours. For 5 days for UTI (Patient not taking: Reported on  02/29/2016) 10 tablet 0  . traMADol (ULTRAM) 50 MG tablet Take 1-2 tablets (50-100 mg total) by mouth every 4 (four) hours as needed for moderate pain. (Patient not taking: Reported on 02/05/2016) 15 tablet 0   No current facility-administered medications for this encounter.     Physical Findings: The patient is in no acute distress. Patient is alert and oriented.  height is 5\' 3"  (1.6 m) and weight is 165 lb 3.2 oz (74.9 kg). Her oral temperature is 98.8 F (37.1 C). Her blood pressure is 143/84 (abnormal) and her pulse is 79. Her oxygen saturation is 99%. .  No significant changes. No palpable subclavicular or axillary adenopathy. The lungs are clear to auscultation. The heart has a regular rhythm and rate. The abdomen is soft and nontender  with normal bowel sounds. Motor strength good in the lower extremities   Lab Findings: Lab Results  Component Value Date   WBC 4.5 02/15/2016   HGB 14.4 02/15/2016   HCT 40.5 02/15/2016   MCV 87.1 02/15/2016   PLT 190 02/15/2016    Radiographic Findings: No results found.  Impression:  The patient is recovering from the effects of radiation.  No significant pain in the right pelvis/femur area  Plan:  I will release the patient to the care of Dr. Marko Plume. Call or return to clinic prn if these symptoms worsen or fail to improve as anticipated.   ____________________________________   This document serves as a record of services personally performed by Gery Pray, MD. It was created on his behalf by Truddie Hidden, a trained medical scribe. The creation of this record is based on the scribe's personal observations and the provider's statements to them. This document has been checked and approved by the attending provider.

## 2016-02-29 NOTE — Telephone Encounter (Signed)
Pt called stating she had sterile cup and collected urine at home. She is bringing it in to [facility]. It is not clear. "the macrobid is not working"

## 2016-02-29 NOTE — Telephone Encounter (Signed)
S/w pt that Dr Marko Plume wants her to hold her xeloda until urine culture is back. Tonight would have been her last dose, she is going in to her week of rest. Told pt that we are changing her antibiotic to cipro q12 hours for 3 days. Pt spoke back directions and verified next appt.

## 2016-03-02 LAB — URINE CULTURE

## 2016-03-03 ENCOUNTER — Other Ambulatory Visit: Payer: Self-pay | Admitting: Oncology

## 2016-03-04 ENCOUNTER — Encounter: Payer: Self-pay | Admitting: Oncology

## 2016-03-04 ENCOUNTER — Ambulatory Visit (HOSPITAL_BASED_OUTPATIENT_CLINIC_OR_DEPARTMENT_OTHER): Payer: Medicare Other

## 2016-03-04 ENCOUNTER — Ambulatory Visit (HOSPITAL_BASED_OUTPATIENT_CLINIC_OR_DEPARTMENT_OTHER): Payer: Medicare Other | Admitting: Oncology

## 2016-03-04 ENCOUNTER — Other Ambulatory Visit (HOSPITAL_BASED_OUTPATIENT_CLINIC_OR_DEPARTMENT_OTHER): Payer: Medicare Other

## 2016-03-04 VITALS — BP 174/89 | HR 81 | Temp 97.7°F | Resp 18 | Ht 63.0 in | Wt 170.0 lb

## 2016-03-04 DIAGNOSIS — C50911 Malignant neoplasm of unspecified site of right female breast: Secondary | ICD-10-CM | POA: Diagnosis not present

## 2016-03-04 DIAGNOSIS — C7951 Secondary malignant neoplasm of bone: Principal | ICD-10-CM

## 2016-03-04 DIAGNOSIS — I10 Essential (primary) hypertension: Secondary | ICD-10-CM

## 2016-03-04 DIAGNOSIS — N390 Urinary tract infection, site not specified: Secondary | ICD-10-CM

## 2016-03-04 DIAGNOSIS — Z5111 Encounter for antineoplastic chemotherapy: Secondary | ICD-10-CM

## 2016-03-04 DIAGNOSIS — C782 Secondary malignant neoplasm of pleura: Secondary | ICD-10-CM

## 2016-03-04 LAB — CBC WITH DIFFERENTIAL/PLATELET
BASO%: 0.9 % (ref 0.0–2.0)
Basophils Absolute: 0 10*3/uL (ref 0.0–0.1)
EOS%: 4.8 % (ref 0.0–7.0)
Eosinophils Absolute: 0.2 10*3/uL (ref 0.0–0.5)
HCT: 41 % (ref 34.8–46.6)
HGB: 13.9 g/dL (ref 11.6–15.9)
LYMPH%: 21 % (ref 14.0–49.7)
MCH: 30.7 pg (ref 25.1–34.0)
MCHC: 33.9 g/dL (ref 31.5–36.0)
MCV: 90.7 fL (ref 79.5–101.0)
MONO#: 0.6 10*3/uL (ref 0.1–0.9)
MONO%: 12.1 % (ref 0.0–14.0)
NEUT#: 3.1 10*3/uL (ref 1.5–6.5)
NEUT%: 61.2 % (ref 38.4–76.8)
Platelets: 241 10*3/uL (ref 145–400)
RBC: 4.52 10*6/uL (ref 3.70–5.45)
RDW: 17.2 % — ABNORMAL HIGH (ref 11.2–14.5)
WBC: 5.1 10*3/uL (ref 3.9–10.3)
lymph#: 1.1 10*3/uL (ref 0.9–3.3)

## 2016-03-04 LAB — COMPREHENSIVE METABOLIC PANEL
ALT: 18 U/L (ref 0–55)
AST: 34 U/L (ref 5–34)
Albumin: 3.9 g/dL (ref 3.5–5.0)
Alkaline Phosphatase: 87 U/L (ref 40–150)
Anion Gap: 8 mEq/L (ref 3–11)
BUN: 19.6 mg/dL (ref 7.0–26.0)
CHLORIDE: 110 meq/L — AB (ref 98–109)
CO2: 21 meq/L — AB (ref 22–29)
CREATININE: 1 mg/dL (ref 0.6–1.1)
Calcium: 11.3 mg/dL — ABNORMAL HIGH (ref 8.4–10.4)
EGFR: 54 mL/min/{1.73_m2} — ABNORMAL LOW (ref >90)
GLUCOSE: 95 mg/dL (ref 70–140)
Potassium: 3.7 mEq/L (ref 3.5–5.1)
Sodium: 139 mEq/L (ref 136–145)
Total Bilirubin: 1.18 mg/dL (ref 0.20–1.20)
Total Protein: 7.5 g/dL (ref 6.4–8.3)

## 2016-03-04 MED ORDER — ZOLEDRONIC ACID 4 MG/100ML IV SOLN
4.0000 mg | Freq: Once | INTRAVENOUS | Status: AC
  Administered 2016-03-04: 4 mg via INTRAVENOUS
  Filled 2016-03-04: qty 100

## 2016-03-04 MED ORDER — SODIUM CHLORIDE 0.9 % IV SOLN
INTRAVENOUS | Status: DC
  Administered 2016-03-04: 16:00:00 via INTRAVENOUS

## 2016-03-04 NOTE — Progress Notes (Signed)
OFFICE PROGRESS NOTE   March 04, 2016   Physicians:J.Kinard, [name], R.Donneta Romberg, A.Kendall. 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 She is to begin cycle 4 xeloda on 03-08-16 and is for monthly zometa today. Last imaging was PET 12-22-15  Patient had another UTI, this Klebsiella documented 02-29-16, not sensitive to initial empiric macrobid, was sensitive to cipro used x 3 days with last dose just over 24 hours ago. Repeat urine specimen obtained today and pending. Nocturia and frequency seem improved since the cipro. She has been afebrile She had 25k citrobacter on 7-27, Klebsiella 100k 7-17 , E coli 4-17  Otherwise patient is tolerating xeloda well overall, occasionally eats saltine crackers for queasiness but has not needed antiemetics, not markedly fatigued towards end of 2 week treatment or in the off week, no mucositis, no diarrhea, appetite fine. Bone pain seems less, with only discomfort now midback to right lateral chest "at bottom of bra" as she has had since surgery unchanged. She has taken tylenol only once in last 3 weeks. Gait is better most of the time. She tolerates limited activity at home, some SOB with exertion not changed, no significant cough. No LE swelling. No bleeding. Not complaining of allergy symptoms. BPs at home daily July 18 thru Aug 14, mostly 120s- 130s over 70s - 80, lowest 113/ 74 and highest 140/83 Remainder of 10 point Review of Systems negative.      No PAC No genetics testing. NOTE following visit, I have been notified  That [facility] is now able to do testing on any patient with metastatic breast cancer,  based on Olaparib study. Will offer to patient.   ONCOLOGIC HISTORY The right breast carcinoma initially was T2 N1, ER/PR-positive in December 1998, treated with mastectomy with 12 node axillary evaluation and  adjuvant Adriamycin/Cytoxan followed by 5 years of tamoxifen through December 2003. 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 CA 2729 was 434 in April 2010. She has tolerated Femara well and clinically has continued to do very well, though CA 27.29 has never normalized. 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 Patient seen by medical oncology 08-10-15 and radiation begun shortly afterwards by Dr Sondra Come, 22.5 of planned 35 Gy radiation to area of T10 thru 08-29-15. She had first zometa 08-11-15. MRI T spine obtained after some difficulty on 08-29-15, with pathologic compression fracture T10 with retropulsion of compressed vertebra to right with compression of cord called, involvement also T9, T11, L2. She had thoracic six- lumbar one posterolateral arthrodesis, T10 laminectomy, partial laminectomy T9, T11 by Dr Christella Noa on 09-01-15. She was begun on Aromasin on 09-07-15.  PET had been scheduled for 09-04-15, delayed with surgery, accomplished on 09-20-15 with chest adenopathy, increased soft tissue right lung base, extensive bone involvement.  Repeat PET 12-22-15 some improvement in area of radiation T spine, otherwise no improvement in bones, some increase right femur, some central chest adenopathy and right lower chest/ pleural involvement. Aromasin was discontinued and xeloda begun 01-05-16.      Objective:  Vital signs in last 24 hours:  BP (!) 174/89 (BP Location: Right Arm, Patient Position: Sitting)   Pulse 81   Temp 97.7 F (36.5 C) (Oral)  Resp 18   Ht 5' 3"  (1.6 m)   Wt 170 lb (77.1 kg)   SpO2 100%   BMI 30.11 kg/m  Weight stable Alert, oriented and appropriate, talkative and in good spirits. Ambulatory without assistance. No alopecia  HEENT:PERRL, sclerae not icteric. Oral mucosa moist without  lesions, posterior pharynx clear.  Neck supple. No JVD.  Lymphatics:no cervical,supraclavicular adenopathy Resp: slightly diminished BS lower right chest tho not absent, otherwise clear to auscultation bilaterally and normal percussion bilaterally. Respirations not labored RA. No cough Cardio: regular rate and rhythm. No gallop. GI: soft, nontender, not distended, no mass or organomegaly. Normally active bowel sounds.  Musculoskeletal/ Extremities: UE/ LE without pitting edema, cords, tenderness. Back not tender including midback to right. Stands easily from sitting position and ambulates without apparent difficulty now Neuro: speech fluent, strength symmetrical, no focal deficits. PSYCH appropriate mood and affect Skin without rash including midback to right, no ecchymosis, no petechiae Breast exam not repeated.  Lab Results:  Results for orders placed or performed in visit on 03/04/16  CBC with Differential  Result Value Ref Range   WBC 5.1 3.9 - 10.3 10e3/uL   NEUT# 3.1 1.5 - 6.5 10e3/uL   HGB 13.9 11.6 - 15.9 g/dL   HCT 41.0 34.8 - 46.6 %   Platelets 241 145 - 400 10e3/uL   MCV 90.7 79.5 - 101.0 fL   MCH 30.7 25.1 - 34.0 pg   MCHC 33.9 31.5 - 36.0 g/dL   RBC 4.52 3.70 - 5.45 10e6/uL   RDW 17.2 (H) 11.2 - 14.5 %   lymph# 1.1 0.9 - 3.3 10e3/uL   MONO# 0.6 0.1 - 0.9 10e3/uL   Eosinophils Absolute 0.2 0.0 - 0.5 10e3/uL   Basophils Absolute 0.0 0.0 - 0.1 10e3/uL   NEUT% 61.2 38.4 - 76.8 %   LYMPH% 21.0 14.0 - 49.7 %   MONO% 12.1 0.0 - 14.0 %   EOS% 4.8 0.0 - 7.0 %   BASO% 0.9 0.0 - 2.0 %  Comprehensive metabolic panel  Result Value Ref Range   Sodium 139 136 - 145 mEq/L   Potassium 3.7 3.5 - 5.1 mEq/L   Chloride 110 (H) 98 - 109 mEq/L   CO2 21 (L) 22 - 29 mEq/L   Glucose 95 70 - 140 mg/dl   BUN 19.6 7.0 - 26.0 mg/dL   Creatinine 1.0 0.6 - 1.1 mg/dL   Total Bilirubin 1.18 0.20 - 1.20 mg/dL   Alkaline Phosphatase 87 40 - 150 U/L   AST 34 5 - 34 U/L   ALT 18 0 - 55 U/L    Total Protein 7.5 6.4 - 8.3 g/dL   Albumin 3.9 3.5 - 5.0 g/dL   Calcium 11.3 (H) 8.4 - 10.4 mg/dL   Anion Gap 8 3 - 11 mEq/L   EGFR 54 (L) >90 ml/min/1.73 m2   CBC and CMET reviewed at time of visit. Calcium noted, for zometa today  CA 2729 available after visit 318, this having been 260 on 7-17 and 217 on 5-18 CA 15-3 available after visit 252, having been 237 on 7-17 and 241 on 12-07-15.  Studies/Results:  No results found.  Medications: I have reviewed the patient's current medications. Patient asks about "mild sleeping medication", melatonin not useful and intolerant to benadryl. Discussed Remelteon (different melatonin product) or temazepam/ Restoril 15 mg prn. She will look these up and let us know if she wants to try.  DISCUSSION Patient is pleased that she continues to tolerate xeloda  this well and is in agreement with continuing this and zometa.  With clinical stable to improved status, will watch marker. Expect will need to repeat scans in ~ Sept.  Urinary symptoms, including frequency at night disturbing sleep, seem resolved with the 3 days of cipro (completed just over 24 hrs ago) but will follow up urine pending to be sure this has been adequate treatment. Not clear why she is having recurrent UTIs, may need to ask urology to see/ check post void residual if this problem continues.    Not discussed today, see information above re genetics testing by [facility] for any patient with metastatic breast ca.  Assessment/Plan:  1.progressive metastatic breast cancer involving right lung base/ pleura, central chest adenopathy, and extensive bone involvement: pathologic compression fracture T10 and retropulsion of bone impinging on spinal cord for which she had T6 - L1 posterolateral arthrodesis with T10 laminectomy and T9/ T11 partial laminectomy by Dr Christella Noa on 09-01-15.  No significant improvement on Aromasin x 4 months, DC 01-02-16. Oral xeloda 1000 mg bid x 14 days every 21 days,  cycle 1  6-16 thru 01-18-16. Continue monthly zometa. Radiation to right hip/ right femur completed and helpful. Continue xeloda.Overall seems some better, tho marker noted, will follow. May need to repeat PET in ~ Sept if not clearly improving then 2,Several recent UTIs: symptoms better with short course cipro for most recent Klebsiella, repeat urine culture pending. 3 mild.hypercalcemia again today and metastatic bone involvement: zometa today. Other electrolyte supplements around zometa since she cannot tolerate gatorade. 4.Potential adverse interaction xeloda and cozaar, cozaar DCd on 01-03-16. BPs good, watching closely at home  5.diarrhea Jan treated for C diff ( Ab + toxin -) and resolved. 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  8.Advance Directives in place 9.weight nealy stable and appetite better  All questions answered. zometa order confirmed. Time spent 30 min including >50% counseling and coordination of care.  Cc Dr Philip Aspen  Evlyn Clines, MD   03/04/2016, 8:09 PM

## 2016-03-04 NOTE — Patient Instructions (Signed)

## 2016-03-05 LAB — CANCER ANTIGEN 27.29: CA 27.29: 318.7 U/mL — ABNORMAL HIGH (ref 0.0–38.6)

## 2016-03-05 LAB — CANCER ANTIGEN 15-3: CA 15-3: 252.3 U/mL — ABNORMAL HIGH (ref 0.0–25.0)

## 2016-03-06 ENCOUNTER — Other Ambulatory Visit: Payer: Self-pay | Admitting: Oncology

## 2016-03-06 ENCOUNTER — Encounter: Payer: Self-pay | Admitting: Oncology

## 2016-03-06 DIAGNOSIS — C50911 Malignant neoplasm of unspecified site of right female breast: Secondary | ICD-10-CM

## 2016-03-06 DIAGNOSIS — Z5111 Encounter for antineoplastic chemotherapy: Secondary | ICD-10-CM | POA: Insufficient documentation

## 2016-03-06 DIAGNOSIS — C7951 Secondary malignant neoplasm of bone: Secondary | ICD-10-CM

## 2016-03-06 LAB — URINE CULTURE

## 2016-03-06 NOTE — Progress Notes (Signed)
Medical Oncology  Scheduling message:  LL x 30 min + lab + zometa 9-11 PET ~ 10-5 LL x 30 min + lab + zometa 10-9  L.Livesay,MD

## 2016-03-07 ENCOUNTER — Other Ambulatory Visit: Payer: Self-pay | Admitting: Oncology

## 2016-03-07 ENCOUNTER — Encounter: Payer: Self-pay | Admitting: Oncology

## 2016-03-07 NOTE — Progress Notes (Signed)
Medical Oncology  Scheduling request sent in error, this already done 8-16 and not located by MD until second sent.   Scheduling request from 8-16 correct  L.Marko Plume, MD

## 2016-03-12 ENCOUNTER — Telehealth: Payer: Self-pay

## 2016-03-12 NOTE — Telephone Encounter (Signed)
Pt called asking about bone density and mammogram. She forgot to ask Dr Edwyna Shell about this during her phone call. Gave information per Dr Edwyna Shell attached message. Pt appreciative of information

## 2016-03-12 NOTE — Telephone Encounter (Signed)
-----   Message from Gordy Levan, MD sent at 03/05/2016  8:56 AM EDT ----- When we let her know about the urine culture from 8-14 still pending now,  please also tell her that she does not need bone density scan this year as she is off the aromatase inhibitor. She also really does not need the mammogram in Sept, as we are treating her metastatic breast cancer with lots of interventions now, and PET in June did not show anything in breast. We can continue to do breast exam at least every few months with my visits.  thanks

## 2016-03-19 ENCOUNTER — Telehealth: Payer: Self-pay

## 2016-03-19 ENCOUNTER — Other Ambulatory Visit (HOSPITAL_BASED_OUTPATIENT_CLINIC_OR_DEPARTMENT_OTHER): Payer: Medicare Other

## 2016-03-19 DIAGNOSIS — R300 Dysuria: Secondary | ICD-10-CM

## 2016-03-19 DIAGNOSIS — C50911 Malignant neoplasm of unspecified site of right female breast: Secondary | ICD-10-CM

## 2016-03-19 LAB — URINALYSIS, MICROSCOPIC - CHCC
Bilirubin (Urine): NEGATIVE
Glucose: NEGATIVE mg/dL
Ketones: NEGATIVE mg/dL
Leukocyte Esterase: NEGATIVE
NITRITE: NEGATIVE
Specific Gravity, Urine: 1.01 (ref 1.003–1.035)
UROBILINOGEN UR: 0.2 mg/dL (ref 0.2–1)
pH: 6.5 (ref 4.6–8.0)

## 2016-03-19 MED ORDER — CIPROFLOXACIN HCL 250 MG PO TABS: 250.0000 mg | ORAL_TABLET | Freq: Two times a day (BID) | ORAL | 0 refills | Status: DC

## 2016-03-19 MED FILL — CIPROFLOXACIN HCL 250 MG TA: 250 | 3 days supply | Qty: 6 | Fill #0

## 2016-03-19 NOTE — Telephone Encounter (Signed)
Ms Messineo states that right sided pain is better this am after resting. She will begin Cipro 250 mg bid x 3vdays.  Continue Xeloda. Ms Bru does not use the Zofran with the Xeloda tablets and has not been nauseous.  Pharmacist Aaron Edelman at [place] said that Cipro with Zofran increases q-t interval.  Xeloda  alone with Cipro has a minimal effect and she could take Xeloda. Told Ms Strider that  If her right sided pain increases to call Dr. Mariana Kaufman office.  Will let her know the results of the urine culture when final.

## 2016-03-19 NOTE — Telephone Encounter (Signed)
Told Lisa [name] the information noted below by Dr. Marko Plume.  Pt only took 1 cipro  250 mg tab this am.  Will hold any further doses until resuts of the urine culture.

## 2016-03-19 NOTE — Telephone Encounter (Signed)
-----   Message from Gordy Levan, MD sent at 03/19/2016  3:20 PM EDT ----- Labs seen and need follow up :  UA does not look worrisome. Suggest waiting on antibiotic until we see culture.   thanks

## 2016-03-20 LAB — URINE CULTURE

## 2016-03-21 NOTE — Telephone Encounter (Signed)
Per Dr Marko Plume: no urine infection on the culture.

## 2016-03-27 ENCOUNTER — Telehealth: Payer: Self-pay

## 2016-03-27 NOTE — Telephone Encounter (Signed)
Pt has 4 xeloda left from prior times when xeloda stopped early. She is due to start cycle 5 on Friday. Does Dr Marko Plume want to rx 56 tabs or 28 tabs (then subtract the 4 she has at home?) Pt uses [facility] outpatient pharmacy for xeloda.

## 2016-03-27 NOTE — Telephone Encounter (Signed)
lvm for pt to call. I realized during our conversation that the pt stated the pharmacy gave her 56 tablets for 2 cycles. The question is is she taking 1 tablet BID or is she taking 2 tablet BID? The Rx is for 2 tablet BID which would mean 56 tablets is for 1 cycle.

## 2016-03-27 NOTE — Telephone Encounter (Signed)
Pt called back and clarified she has been taking 1 tablet every 12 hours since she started on xeloda. Will clarify with Dr Marko Plume.

## 2016-03-28 ENCOUNTER — Telehealth: Payer: Self-pay | Admitting: Oncology

## 2016-03-28 ENCOUNTER — Other Ambulatory Visit: Payer: Self-pay | Admitting: Oncology

## 2016-03-28 NOTE — Telephone Encounter (Signed)
Medical Oncology  Follow up call to patient by MD now  She is taking 500 mg xeloda bid, states this is the dose that she has used all of treatment. She prefers not to increase dose, is afraid of nausea and diarrhea, tho she has not had any of those problems. She has 4 xeloda left.  She has had more pain in hips in last day or so. She has no symptoms of UTI.  She is concerned about weather early next week, would like to come in for labs on 9-8 instead of lab with MD and Rx on 9-11.  Urgent scheduling message sent now for CBC CMET CA 2729, CA15-3 on 9-8 instead of 9-11. Patient aware that we will watch for labs and can send xeloda script to [facility] if counts ok on 03-29-16.  Message to RN to be sure MD knows CBC on 9-8.  Godfrey Pick, MD

## 2016-03-29 ENCOUNTER — Other Ambulatory Visit: Payer: Self-pay

## 2016-03-29 ENCOUNTER — Ambulatory Visit (HOSPITAL_BASED_OUTPATIENT_CLINIC_OR_DEPARTMENT_OTHER): Payer: Medicare Other

## 2016-03-29 DIAGNOSIS — C50911 Malignant neoplasm of unspecified site of right female breast: Secondary | ICD-10-CM | POA: Diagnosis not present

## 2016-03-29 DIAGNOSIS — C7951 Secondary malignant neoplasm of bone: Principal | ICD-10-CM

## 2016-03-29 LAB — CBC WITH DIFFERENTIAL/PLATELET
BASO%: 1.1 % (ref 0.0–2.0)
Basophils Absolute: 0.1 10*3/uL (ref 0.0–0.1)
EOS ABS: 0.2 10*3/uL (ref 0.0–0.5)
EOS%: 4 % (ref 0.0–7.0)
HCT: 40 % (ref 34.8–46.6)
HEMOGLOBIN: 13.7 g/dL (ref 11.6–15.9)
LYMPH#: 0.9 10*3/uL (ref 0.9–3.3)
LYMPH%: 19 % (ref 14.0–49.7)
MCH: 31.5 pg (ref 25.1–34.0)
MCHC: 34.3 g/dL (ref 31.5–36.0)
MCV: 91.8 fL (ref 79.5–101.0)
MONO#: 0.6 10*3/uL (ref 0.1–0.9)
MONO%: 11.5 % (ref 0.0–14.0)
NEUT%: 64.4 % (ref 38.4–76.8)
NEUTROS ABS: 3.1 10*3/uL (ref 1.5–6.5)
PLATELETS: 213 10*3/uL (ref 145–400)
RBC: 4.35 10*6/uL (ref 3.70–5.45)
RDW: 15.3 % — ABNORMAL HIGH (ref 11.2–14.5)
WBC: 4.8 10*3/uL (ref 3.9–10.3)

## 2016-03-29 LAB — COMPREHENSIVE METABOLIC PANEL
ALBUMIN: 3.6 g/dL (ref 3.5–5.0)
ALK PHOS: 91 U/L (ref 40–150)
ALT: 17 U/L (ref 0–55)
ANION GAP: 9 meq/L (ref 3–11)
AST: 35 U/L — ABNORMAL HIGH (ref 5–34)
BILIRUBIN TOTAL: 1.15 mg/dL (ref 0.20–1.20)
BUN: 18.6 mg/dL (ref 7.0–26.0)
CO2: 22 mEq/L (ref 22–29)
CREATININE: 1.1 mg/dL (ref 0.6–1.1)
Calcium: 11.4 mg/dL — ABNORMAL HIGH (ref 8.4–10.4)
Chloride: 108 mEq/L (ref 98–109)
EGFR: 50 mL/min/{1.73_m2} — AB (ref >90)
GLUCOSE: 104 mg/dL (ref 70–140)
Potassium: 3.4 mEq/L — ABNORMAL LOW (ref 3.5–5.1)
Sodium: 139 mEq/L (ref 136–145)
TOTAL PROTEIN: 7.2 g/dL (ref 6.4–8.3)

## 2016-03-29 MED ORDER — CAPECITABINE 500 MG PO TABS: ORAL_TABLET | ORAL | 0 refills | Status: DC

## 2016-03-29 NOTE — Telephone Encounter (Signed)
Reviewed lab results with Dr. Marko Plume and pt can begin Xeloda tonight. Prescription sent to [facility] for Xeloda refill.

## 2016-03-29 NOTE — Telephone Encounter (Signed)
lab on 9-8 and refill xeloda  Received: Yesterday  Message Contents  Gordy Levan, MD  Baruch Merl, RN; Janace Hoard, RN        WIll get lab on 9-8 instead of 9-11  Urgent message sent to sched now for 9-8 lab. Orders moved.  Patient knows that she will be called about the time for lab on 9-8.   OK to send script to [facility] if [place] .=1.5 and plt >=100k. She is taking 500 mg bid and prefers not to increase dose, so will be # 24 for 2 weeks as she has 4 tablets left at home.    She will still see me on 9-11, weather permitting, no lab that day.   thanks

## 2016-03-30 LAB — CANCER ANTIGEN 15-3: CAN 15 3: 275.1 U/mL — AB (ref 0.0–25.0)

## 2016-03-30 LAB — CANCER ANTIGEN 27.29: CA 27.29: 309.3 U/mL — ABNORMAL HIGH (ref 0.0–38.6)

## 2016-03-31 ENCOUNTER — Other Ambulatory Visit: Payer: Self-pay | Admitting: Oncology

## 2016-04-01 ENCOUNTER — Ambulatory Visit (HOSPITAL_BASED_OUTPATIENT_CLINIC_OR_DEPARTMENT_OTHER): Payer: Medicare Other | Admitting: Oncology

## 2016-04-01 ENCOUNTER — Ambulatory Visit (HOSPITAL_BASED_OUTPATIENT_CLINIC_OR_DEPARTMENT_OTHER): Payer: Medicare Other

## 2016-04-01 ENCOUNTER — Other Ambulatory Visit: Payer: Medicare Other

## 2016-04-01 ENCOUNTER — Encounter: Payer: Self-pay | Admitting: Oncology

## 2016-04-01 VITALS — BP 171/69 | HR 85 | Temp 98.4°F | Resp 18 | Ht 63.0 in | Wt 164.9 lb

## 2016-04-01 DIAGNOSIS — G893 Neoplasm related pain (acute) (chronic): Secondary | ICD-10-CM | POA: Diagnosis not present

## 2016-04-01 DIAGNOSIS — C50911 Malignant neoplasm of unspecified site of right female breast: Secondary | ICD-10-CM

## 2016-04-01 DIAGNOSIS — C50919 Malignant neoplasm of unspecified site of unspecified female breast: Secondary | ICD-10-CM

## 2016-04-01 DIAGNOSIS — C782 Secondary malignant neoplasm of pleura: Secondary | ICD-10-CM

## 2016-04-01 DIAGNOSIS — C7951 Secondary malignant neoplasm of bone: Secondary | ICD-10-CM

## 2016-04-01 DIAGNOSIS — Z5111 Encounter for antineoplastic chemotherapy: Secondary | ICD-10-CM

## 2016-04-01 MED ORDER — TRAMADOL HCL 50 MG PO TABS: 50.0000 mg | ORAL_TABLET | ORAL | 0 refills | Status: DC | PRN

## 2016-04-01 MED ORDER — CAPECITABINE 500 MG PO TABS: ORAL_TABLET | ORAL | 0 refills | Status: DC

## 2016-04-01 MED ORDER — ZOLEDRONIC ACID 4 MG/100ML IV SOLN
4.0000 mg | Freq: Once | INTRAVENOUS | Status: AC
  Administered 2016-04-01: 4 mg via INTRAVENOUS
  Filled 2016-04-01: qty 100

## 2016-04-01 MED ORDER — SODIUM CHLORIDE 0.9 % IV SOLN
INTRAVENOUS | Status: DC
  Administered 2016-04-01: 15:00:00 via INTRAVENOUS

## 2016-04-01 MED FILL — CAPECITABINE 500 MG TABLET: 500 | 14 days supply | Qty: 56 | Fill #0

## 2016-04-01 NOTE — Progress Notes (Signed)
OFFICE PROGRESS NOTE   April 01, 2016   Physicians: J.Kinard, [name], R.Donneta Romberg, A.Kendall. K.Cabbell  INTERVAL HISTORY:   Patient is seen, together with mother, in continuing attention to metastatic breast cancer involving left pleura and extensively involving bone. She has been on xeloda since 01-05-16, tho has actually been taking less than dose that had been listed in EMR, and zometa. CA 5-3 and CA 2729 have not improved recently, and patient has had more low back and hip discomfort.   Patient reports pain across low back, better with tramadol 100 mg last pm, which she is generally reluctant to use. She has some NP cough without SOB at rest, no chest pain. She has slight queasiness, which improves with saltines, which she prefers to antiemetics. She has had no vomiting and is eating and drinking fluids. She denies diarrhea, mucositis, LE swelling. She has not fallen. She has had no fever or symptoms of infection, including no clear UTI symptoms (tho has had UTIs unexpectedly).  States BP is much better consistently at home (off cozaar with xeloda). Remainder of 10 point Review of Systems negative.    No PAC Genetics counseling planned 04-11-16, may be eligible for testing from [place] The right breast carcinoma initially was T2 N1, ER/PR-positive in December 1998, treated with mastectomy with 12 node axillary evaluation and adjuvant Adriamycin/Cytoxan followed by 5 years of tamoxifen through December 2003. 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 CA 2729 was 434 in April 2010. She has tolerated Femara well and clinically has continued to do very well, though CA 27.29 has never normalized. 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 Patient seen by medical oncology  08-10-15 and radiation begun shortly afterwards by Dr Sondra Come, 22.5 of planned 35 Gy radiation to area of T10 thru 08-29-15. She had first zometa 08-11-15. MRI T spine obtained after some difficulty on 08-29-15, with pathologic compression fracture T10 with retropulsion of compressed vertebra to right with compression of cord called, involvement also T9, T11, L2. She had thoracic six- lumbar one posterolateral arthrodesis, T10 laminectomy, partial laminectomy T9, T11 by Dr Christella Noa on 09-01-15. She was begun on Aromasin on 09-07-15.  PET had been scheduled for 09-04-15, delayed with surgery, accomplished on 09-20-15 with chest adenopathy, increased soft tissue right lung base, extensive bone involvement.  Repeat PET 12-22-15 some improvement in area of radiation T spine, otherwise no improvement in bones, some increase right femur, some central chest adenopathy and right lower chest/ pleural involvement. Aromasin was discontinued and xeloda begun 01-05-16.    Objective:  Vital signs in last 24 hours:  BP (!) 171/69 (BP Location: Left Arm, Patient Position: Sitting) Comment: informed nurse  Pulse 85   Temp 98.4 F (36.9 C) (Oral)   Resp 18   Ht _0  (1.6 m)   Wt 164 lb 14.4 oz (74.8 kg)   SpO2 100%   BMI 29.21 kg/m   Alert, oriented and appropriate. Talkative, not in obvious discomfort seated in exam room. Ambulatory with rolling walker, able to stand from sitting position without assistance..  No alopecia  HEENT:PERRL, sclerae not icteric. Oral mucosa moist without lesions, posterior pharynx clear.   No JVD.  Lymphatics:no cervical,supraclavicular adenopathy Resp: BS heard to lower fields bilaterally, no wheezes or rales Cardio: regular rate and rhythm. No  gallop. GI: soft, nontender, not distended, no mass or organomegaly. Normally active bowel sounds.  Musculoskeletal/ Extremities: UE/ LE without pitting edema, cords, tenderness Neuro: seems nonfocal. PSYCH appropriate mood and affect Skin  without rash, ecchymosis, petechiae. Palms without erythema    Lab Results:  Results for orders placed or performed in visit on 03/29/16  CBC with Differential  Result Value Ref Range   WBC 4.8 3.9 - 10.3 10e3/uL   NEUT# 3.1 1.5 - 6.5 10e3/uL   HGB 13.7 11.6 - 15.9 g/dL   HCT 40.0 34.8 - 46.6 %   Platelets 213 145 - 400 10e3/uL   MCV 91.8 79.5 - 101.0 fL   MCH 31.5 25.1 - 34.0 pg   MCHC 34.3 31.5 - 36.0 g/dL   RBC 4.35 3.70 - 5.45 10e6/uL   RDW 15.3 (H) 11.2 - 14.5 %   lymph# 0.9 0.9 - 3.3 10e3/uL   MONO# 0.6 0.1 - 0.9 10e3/uL   Eosinophils Absolute 0.2 0.0 - 0.5 10e3/uL   Basophils Absolute 0.1 0.0 - 0.1 10e3/uL   NEUT% 64.4 38.4 - 76.8 %   LYMPH% 19.0 14.0 - 49.7 %   MONO% 11.5 0.0 - 14.0 %   EOS% 4.0 0.0 - 7.0 %   BASO% 1.1 0.0 - 2.0 %  Comprehensive metabolic panel  Result Value Ref Range   Sodium 139 136 - 145 mEq/L   Potassium 3.4 (L) 3.5 - 5.1 mEq/L   Chloride 108 98 - 109 mEq/L   CO2 22 22 - 29 mEq/L   Glucose 104 70 - 140 mg/dl   BUN 18.6 7.0 - 26.0 mg/dL   Creatinine 1.1 0.6 - 1.1 mg/dL   Total Bilirubin 1.15 0.20 - 1.20 mg/dL   Alkaline Phosphatase 91 40 - 150 U/L   AST 35 (H) 5 - 34 U/L   ALT 17 0 - 55 U/L   Total Protein 7.2 6.4 - 8.3 g/dL   Albumin 3.6 3.5 - 5.0 g/dL   Calcium 11.4 (H) 8.4 - 10.4 mg/dL   Anion Gap 9 3 - 11 mEq/L   EGFR 50 (L) >90 ml/min/1.73 m2  CA 15.3  Result Value Ref Range   CA 15-3 275.1 (H) 0.0 - 25.0 U/mL  Cancer antigen 27.29  Result Value Ref Range   CA 27.29 309.3 (H) 0.0 - 38.6 U/mL    CA 15-3 on 8-14 was 252, on 7-17 was 237 and on 5-18 was 240.  CA 2729 on 8-14 was 318, on 7-17 was 260 and on 5-18 was 217  Studies/Results:  No results found.  Medications: I have reviewed the patient's current medications. Refill tramadol. Zometa today  DISCUSSION Will increase xeloda to 1000 mg bid, as I had mistakenly understood that she was taking.  Needs repeat imaging with PET, to evaluate all of bony skeleton as well as  lungs and pleura where post VATs and radiation changes.     Assessment/Plan: 1.progressive metastatic breast cancer involving right lung base/ pleura, central chest adenopathy, and extensive bone involvement: pathologic compression fracture T10 and retropulsion of bone impinging on spinal cord for which she had T6 - L1 posterolateral arthrodesis with T10 laminectomy and T9/ T11 partial laminectomy by Dr Christella Noa on 09-01-15.  No significant improvement on Aromasin x 4 months, DC 01-02-16. Oral xeloda which she has actually been taking 500 mg bid x 14 days every 21 days instead of 1000 mg bid x 14 days every 21 days.  Continue monthly zometa. Radiation to right hip/  right femur completed and helpful.  PET to be done hopefully in next couple of weeks as may need to change systemic therapy. 2,Several recent UTIs: symptoms better with short course cipro for most recent Klebsiella, last urine culture 03-19-16 negative 3 mild.hypercalcemia again today and metastatic bone involvement: zometa today. Other electrolyte supplements around zometa since she cannot tolerate gatorade. 4.Potential adverse interaction xeloda and cozaar, cozaar DCd on 01-03-16. BPs good, watching closely at home  5.diarrhea Jan treated for C diff ( Ab + toxin -) and resolved. 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  8.Advance Directives in place 9.needs flu vaccine, preferably on off week from chemo   All questions answered and she knows to call if needed prior to next scheduled appointment. Zometa orders confirmed. TIme spent 25 min including >50% counseling and coordination of care.      Evlyn Clines, MD   04/01/2016, 3:11 PM

## 2016-04-01 NOTE — Patient Instructions (Signed)

## 2016-04-07 ENCOUNTER — Other Ambulatory Visit: Payer: Self-pay | Admitting: Oncology

## 2016-04-07 DIAGNOSIS — C50911 Malignant neoplasm of unspecified site of right female breast: Secondary | ICD-10-CM

## 2016-04-10 ENCOUNTER — Encounter: Payer: Self-pay | Admitting: Genetic Counselor

## 2016-04-10 ENCOUNTER — Telehealth: Payer: Self-pay

## 2016-04-10 ENCOUNTER — Other Ambulatory Visit: Payer: Self-pay | Admitting: Oncology

## 2016-04-10 NOTE — Telephone Encounter (Signed)
-----   Message from Gordy Levan, MD sent at 04/07/2016  8:33 PM EDT ----- Please check on patient by phone - see how she has tolerated most recent xeloda, ? If she increased dose to 1000 mg bid.  If she has increased dose please add CBC to genetics on 9-21, and ask if mucositis, diarrhea, increased nausea.  Be sure she has enough tramadol and confirm that is helping adequately with pain.  I would like for her to have flu vaccine, hopefully can coordinate with other appointment, preferably during off week from chemo.  I have ordered PET hopefully within a week or so prior to my next visit on 10-9  thanks

## 2016-04-10 NOTE — Telephone Encounter (Signed)
Lisa [name] has taken increased dose of Xeloda to 1000 mg bid beginning 04-03-16. She will complete the course on 04-15-14. Denies any mouth sores or diarrhea.   Nausea has not increased.  She is eating a few saltines which resolves the nausea.  She has not used any Zofran in weeks. She has been having pain in left pelvis. Level is ~6/10 most of the time.  She uses tylenol 325 mg -500 mg once a day. She has taken 2  Tramadol 50 mg tabs twice this week.  Pain level ~3/10 with tramadol, pending how she is moving or at rest. Using a walker. Has a left leg limp. Told her that Dr. Marko Plume said that she can take 1-2 tabs of tramadol every 8 hrs prn pain. She will have tramadol prescription from 04-01-16 filled tomorrow.  Lisa [name] stated that she does not take the flu shot. She has not done so for 34 years.  Dr. Marko Plume notified.  Lisa [name] is aware of getting a CBC/diff tomorrow 04-11-16 even if she does not get genetic testing done. Told Lisa [name] that Dr. Marko Plume has ordered the PET/ CT for ~04-25-16 and visit with her on 04-29-16. Lab 1330/visit 1400 and Zometa at 1500.

## 2016-04-10 NOTE — Telephone Encounter (Signed)
LM in Ms Cutchall' vm to call back regarding questions noted below by Dr. Marko Plume.

## 2016-04-11 ENCOUNTER — Telehealth: Payer: Self-pay

## 2016-04-11 ENCOUNTER — Other Ambulatory Visit (HOSPITAL_BASED_OUTPATIENT_CLINIC_OR_DEPARTMENT_OTHER): Payer: Medicare Other

## 2016-04-11 ENCOUNTER — Ambulatory Visit (HOSPITAL_BASED_OUTPATIENT_CLINIC_OR_DEPARTMENT_OTHER): Payer: Medicare Other | Admitting: Genetic Counselor

## 2016-04-11 DIAGNOSIS — Z8042 Family history of malignant neoplasm of prostate: Secondary | ICD-10-CM

## 2016-04-11 DIAGNOSIS — C50911 Malignant neoplasm of unspecified site of right female breast: Secondary | ICD-10-CM | POA: Diagnosis not present

## 2016-04-11 DIAGNOSIS — Z803 Family history of malignant neoplasm of breast: Secondary | ICD-10-CM

## 2016-04-11 DIAGNOSIS — C7951 Secondary malignant neoplasm of bone: Secondary | ICD-10-CM

## 2016-04-11 DIAGNOSIS — Z315 Encounter for genetic counseling: Secondary | ICD-10-CM | POA: Diagnosis not present

## 2016-04-11 LAB — CBC WITH DIFFERENTIAL/PLATELET
BASO%: 1 % (ref 0.0–2.0)
BASOS ABS: 0.1 10*3/uL (ref 0.0–0.1)
EOS ABS: 0.2 10*3/uL (ref 0.0–0.5)
EOS%: 3.9 % (ref 0.0–7.0)
HEMATOCRIT: 39.2 % (ref 34.8–46.6)
HGB: 13.4 g/dL (ref 11.6–15.9)
LYMPH%: 22.4 % (ref 14.0–49.7)
MCH: 31.8 pg (ref 25.1–34.0)
MCHC: 34.3 g/dL (ref 31.5–36.0)
MCV: 92.6 fL (ref 79.5–101.0)
MONO#: 0.8 10*3/uL (ref 0.1–0.9)
MONO%: 13.2 % (ref 0.0–14.0)
NEUT#: 3.4 10*3/uL (ref 1.5–6.5)
NEUT%: 59.5 % (ref 38.4–76.8)
Platelets: 253 10*3/uL (ref 145–400)
RBC: 4.23 10*6/uL (ref 3.70–5.45)
RDW: 14.2 % (ref 11.2–14.5)
WBC: 5.7 10*3/uL (ref 3.9–10.3)
lymph#: 1.3 10*3/uL (ref 0.9–3.3)

## 2016-04-11 MED FILL — traMADol HCL 50 MG TABS: 50 | 7 days supply | Qty: 40 | Fill #0

## 2016-04-11 NOTE — Telephone Encounter (Signed)
lvm that CBC was good after the increase in Xeloda

## 2016-04-12 ENCOUNTER — Encounter: Payer: Self-pay | Admitting: Genetic Counselor

## 2016-04-12 NOTE — Progress Notes (Signed)
REFERRING PROVIDER: [name], MD [address] [place], [place] [address]  PRIMARY PROVIDER:  [name], MD  PRIMARY REASON FOR VISIT:  1. Carcinoma of breast metastatic to bone, right ([place])   2. Family history of breast cancer in female   3. Family history of prostate cancer      HISTORY OF PRESENT ILLNESS:   Lisa [name], a 75 y.o. female, was seen for a [facility] cancer genetics consultation at the request of Dr. Marko Plume due to a personal of metastatic breast cancer and family history of breast and prostate cancers.  Lisa [name] presents to clinic today to discuss the possibility of a hereditary predisposition to cancer, genetic testing, and to further clarify her future cancer risks, as well as potential cancer risks for family members.   In December 1998, at the age of 55, Lisa [name] was diagnosed with carcinoma of the right breast. This was treated with right mastectomy, adjuvant chemotherapy, and five years of tamoxifen.  She later experience recurrence and metastasis to the pleural sac and bones.  Lisa [name] reports no additional personal history of cancer.    CANCER HISTORY:  Oncology History   Right breast carcinoma T2N1 ER/PR positive at right mastectomy with node evaluation Dec 1998, treated with adjuvant adriamycin cytoxan followed by 5 years of tamoxifen. Recurrent to pleura 2010.     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  First live birth at age - no children.  OCP use for approximately 0 years.  Ovaries intact: yes.  Hysterectomy: yes, in 68 (age 82) to address fibroids.  Menopausal status: postmenopausal.  HRT use: premarin for 5-6 months. Colonoscopy: yes; reports history of two sigmoidoscopies and one colonoscopy several years ago; reports no history of polyps. Mammogram within the last year: yes. Number of  breast biopsies: 1. Up to date with pelvic exams:  n/a. Any excessive radiation exposure/other exposures in the past:  Reports some history of secondhand smoke exposure  Past Medical History:  Diagnosis Date  . Breast cancer ([place]) 07-13-1997   right  . Chronic back pain   . Complication of anesthesia   . Hypertension   . Pleural effusion 11-08-2008  . PONV (postoperative nausea and vomiting)   . Radiation 08/17/2015-08/29/2015   lower thoracic spine 22.5 gray  . Radiation 01/17/16-01/24/16   right femur 20Gy    Past Surgical History:  Procedure Laterality Date  . ABDOMINAL HYSTERECTOMY  02-21-1982  . BREAST BIOPSY  07-13-1997  . DILATION AND CURETTAGE, DIAGNOSTIC / THERAPEUTIC  12-13-1981  . MASTECTOMY  07-29-1997  . PELVIC LAPAROSCOPY  11-06-1981  . POSTERIOR LUMBAR FUSION 4 LEVEL N/A 09/01/2015   Procedure: Thoracic six-Lumbar one fusion with arthrodesis pedicle screw stabilization and decompression;  Surgeon: Ashok Pall, MD;  Location: [facility];  Service: Neurosurgery;  Laterality: N/A;  T6-L1 fusion with arthrodesis pedicle screw stabilization and decompression  . THORACENTESIS  10-18-2008    Social History   Social History  . Marital status: Single    Spouse name: N/A  . Number of children: 0  . Years of education: N/A   Occupational History  . Administraton for [employer]    Social History Main Topics  . Smoking status: Former Smoker    Packs/day: 0.50    Years: 30.00    Types: Cigarettes  Quit date: 07/22/1996  . Smokeless tobacco: Never Used     Comment: 1/2 up to 1 ppd  . Alcohol use No     Comment: hx of alcohol socially - hasn't had drink since March 1998  . Drug use: No  . Sexual activity: Not Asked   Other Topics Concern  . None   Social History Narrative  . None     FAMILY HISTORY:  We obtained a detailed, 4-generation family history.  Significant diagnoses are listed below: Family History  Problem Relation Age of Onset  . Heart  disease Mother     with pacemaker   . Asthma Father   . Heart attack Maternal Grandfather 64  . Diabetes Paternal Grandmother   . Aortic stenosis Maternal Uncle   . Prostate cancer Maternal Uncle     dx unspecified age  . Prostate cancer Maternal Uncle     dx older than 50y  . Prostate cancer Maternal Uncle     dx older than 50y  . Prostate cancer Maternal Uncle 72    s/p prostatectomy  . Breast cancer Cousin 31    maternal 1st cousin; s/p lumpectomy and radiation  . Breast cancer Cousin     maternal 1st cousin dx mid-late 56s; s/p mastectomy    Lisa [name] is an only child.  Her mother is still living at 59; she has never been diagnosed with cancer.  Her mother has five full brothers.  Only one brother is still living--he is currently 40.  Four of the five brothers have a history of prostate cancer, Lisa [name] is not sure when exactly these uncles were diagnosed, but it sounds like at least most were diagnosed over the age of 21.  She is unsure of Gleason scores for these uncles.  The only unaffected uncle passed away in childhood from measles and kidney failure.  Two of Lisa [name] maternal first cousins have a history of breast cancer.  One cousin was diagnosed at approximately 61 and was treated with a lumpectomy and radiation.  The other cousin was diagnosed in her mid-late 71s and was treated with a mastectomy.  Lisa [name] maternal grandmother passed away at age 37 and never had cancer.  Her maternal grandfather passed away from a heart attack at 34.  Lisa [name] has no information for her maternal great aunts/uncles and great grandparents.  Lisa [name] father died of pneumonia at 39, and he was never diagnosed with cancer.  He was an only child.  Lisa [name] paternal grandmother died of diabetes-related cause in her late 58s-early 80s.  Her grandfather died of "a liver hemorrhage" in his late 87s-early 80s, after a fall.  Lisa [name] has no information for any of her paternal  great aunts/uncles or great grandparents.  Lisa [name] in unaware of any previous family history of genetic testing for hereditary cancer.  Patient's maternal and paternal ancestors are of [place] descent. There is no reported Ashkenazi Jewish ancestry. There is no known consanguinity.  GENETIC COUNSELING ASSESSMENT: Lisa [name] is a 75 y.o. female with a personal and family history of cancer which is somewhat suggestive of a hereditary breast cancer syndrome and predisposition to cancer. We, therefore, discussed and recommended the following at today's visit.   DISCUSSION: We reviewed the characteristics, features and inheritance patterns of hereditary cancer syndromes, particularly those caused by mutations within the BRCA1/2 genes. We also discussed genetic testing, including the appropriate family members to test, the process of testing, insurance coverage  and turn-around-time for results. We discussed the implications of a negative, positive and/or variant of uncertain significant result. We recommended Lisa [name] pursue genetic testing for the 28-gene [facility] Hereditary Cancer Panel through [employer].  The [facility] gene panel offered by [employer] includes sequencing and deletion/duplication testing of the following 28 genes: APC, ATM, BARD1, BMPR1A, BRCA1, BRCA2, BRIP1, CHD1, CDK4, CDKN2A, CHEK2, EPCAM (large rearrangement only), GREM1/SCG5, MLH1, MSH2, MSH6, MUTYH, NBN, PALB2, PMS2, POLD1, POLE, PTEN, RAD51C, RAD51D, SMAD4, STK11, and TP53.   Based on Lisa [name] personal and family history of cancer, she meets medical criteria for genetic testing. Because she has metastatic breast cancer and positive genetic testing could help further inform her treatment options, Lisa [name] meets [facility]' criteria for no-cost genetic testing.  Testing will be billed through her insurance, but she will not be responsible for any genetic testing costs that insurance  does not cover.  She should not receive a bill, and she is advised to contact us if she receive a bill in error.  PLAN: After considering the risks, benefits, and limitations, Lisa [name]  provided informed consent to pursue genetic testing and the blood sample was sent to [facility] for analysis of the 28-gene [facility] Hereditary Cancer Panel. Results should be available within approximately 2-3 weeks' time, at which point they will be disclosed by telephone to Ms. Salle, as will any additional recommendations warranted by these results. Ms. Lucatero will receive a summary of her genetic counseling visit and a copy of her results once available. This information will also be available in Epic. We encouraged Ms. Nhem to remain in contact with cancer genetics annually so that we can continuously update the family history and inform her of any changes in cancer genetics and testing that may be of benefit for her family. Ms. Colantuono questions were answered to her satisfaction today. Our contact information was provided should additional questions or concerns arise.  Thank you for the referral and allowing [place] to share in the care of your patient.   Jeanine Luz, MS, [facility] Certified Genetic Counselor [email or web address] Phone: [phone]  The patient was seen for a total of 60 minutes in face-to-face genetic counseling.  This patient was discussed with Drs. Magrinat, Lindi Adie and/or Burr Medico who agrees with the above.    _______________________________________________________________________ For Office Staff:  Number of people involved in session: 1 Was an Intern/ student involved with case: no

## 2016-04-19 ENCOUNTER — Telehealth: Payer: Self-pay

## 2016-04-19 DIAGNOSIS — C50911 Malignant neoplasm of unspecified site of right female breast: Secondary | ICD-10-CM

## 2016-04-19 DIAGNOSIS — C7951 Secondary malignant neoplasm of bone: Principal | ICD-10-CM

## 2016-04-19 NOTE — Telephone Encounter (Signed)
Call received from triage voice mail at 1615. Lisa [name] is due to begin next Cycle 6 of Xeloda 1000 mg bid x 14 days on 04-23-16. Does Dr. Marko Plume want  her to continue  or wait until after PET scan.  The scheduler just told her that the only available PET was 0630 on 04-26-16.  She cannot get up and be at appointment with her current back situation.   She is to follow up with Dr. Marko Plume on 04-29-16.  Reached Lisa [name] and told her that this information was being sent to Dr. Marko Plume to review. Suggested that she call back Tuesday 04-23-16 am if she has not heard from Dr. Mariana Kaufman office.

## 2016-04-22 MED ORDER — CAPECITABINE 500 MG PO TABS: ORAL_TABLET | ORAL | 0 refills | Status: DC

## 2016-04-22 MED FILL — CAPECITABINE 500 MG TABLET: 500 | 14 days supply | Qty: 56 | Fill #0

## 2016-04-22 NOTE — Telephone Encounter (Signed)
-----   Message from Gordy Levan, MD sent at 04/19/2016  8:27 PM EDT ----- Note received 9-29 PM: "Lisa [name] is due to begin next Cycle 6 of Xeloda 1000 mg bid x 14 days on 04-23-16. Does Dr. Marko Plume want  Lisa [name] to continue  or wait until after PET scan.  The scheduler just told Lisa [name] that the only available PET was 0630 on 04-26-16.  She cannot get up and be at appointment with Lisa [name] current back situation.   She is to follow up with Dr. Marko Plume on 04-29-16.  Reached Lisa [name] and told Lisa [name] that this information was being sent to Dr. Marko Plume to review. Suggested that she call back Tuesday 04-23-16 am if she has not heard from Dr. Mariana Kaufman office."  Lisa [name] -  please see when next PET is at a time patient can manage.  OK to go ahead with xeloda at 1000 mg bid starting 10-3. I would like CBC within the week of starting xeloda, possibly coordinate day of PET. Depending on when PET is I may need to move my 10-9 appointment. If we cannot get PET in timely fashion (within the week) I will need to get some other imaging of Lisa [name] back. Thanks Lennis  thanks

## 2016-04-22 NOTE — Telephone Encounter (Signed)
S/w pt to start her xeloda tomorrow. She stated she needed a refill - order sent per Dr Edwyna Shell attached note.  Pt stated she could get to PET 10 am or later. Will call in AM to schedule PET.

## 2016-04-23 NOTE — Telephone Encounter (Deleted)
-----   Message from Gordy Levan, MD sent at 04/19/2016  8:27 PM EDT ----- Note received 9-29 PM: "Lisa [name] is due to begin next Cycle 6 of Xeloda 1000 mg bid x 14 days on 04-23-16. Does Lisa [name] want  her to continue  or wait until after PET scan.  The scheduler just told her that the only available PET was 0630 on 04-26-16.  She cannot get up and be at appointment with her current back situation.   She is to follow up with Lisa [name] on 04-29-16.  Reached Lisa [name] and told her that this information was being sent to Lisa [name] to review. Suggested that she call back Tuesday 04-23-16 am if she has not heard from Lisa [name] office."  Lisa [name] -  please see when next PET is at a time patient can manage.  OK to go ahead with xeloda at 1000 mg bid starting 10-3. I would like CBC within the week of starting xeloda, possibly coordinate day of PET. Depending on when PET is I may need to move my 10-9 appointment. If we cannot get PET in timely fashion (within the week) I will need to get some other imaging of her back. Thanks Lennis  thanks

## 2016-04-23 NOTE — Telephone Encounter (Signed)
S/w scheduling and first available at [facility] at or after 10 am is 10/12 at 10 am, pt to arrive at 0930.   lvm with pt about appt time and date. Let Dr Marko Plume know of this as well to see if we need other imaging as per her attached note.

## 2016-04-24 ENCOUNTER — Telehealth: Payer: Self-pay | Admitting: Genetic Counselor

## 2016-04-24 NOTE — Telephone Encounter (Signed)
Discussed with Ms. Pickel that her genetic test result was negative for known pathogenic mutations within any of 28 genes (including the BRCA genes).  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  Discussed that we treat this like a negative test result until its updated by the lab and reviewed why we do that.  Ms. Loschiavo should continue to follow her doctors recommendations.  Discussed that others of her maternal relatives can have genetic testing (especially those with breast or prostate cancer) because there still might be a mutation in the family perhaps that Ms. Kloeppel herself just did not inherit.  Ms. Mayon should keep her phone number up to date with [place] because we will call her as soon as we learn more about this VUS.  She is welcome to call with any questions she may have.  I will mail her a copy of her results.

## 2016-04-25 ENCOUNTER — Ambulatory Visit: Payer: Self-pay | Admitting: Genetic Counselor

## 2016-04-25 ENCOUNTER — Telehealth: Payer: Self-pay

## 2016-04-25 DIAGNOSIS — Z1379 Encounter for other screening for genetic and chromosomal anomalies: Secondary | ICD-10-CM

## 2016-04-25 DIAGNOSIS — Z803 Family history of malignant neoplasm of breast: Secondary | ICD-10-CM

## 2016-04-25 DIAGNOSIS — C50911 Malignant neoplasm of unspecified site of right female breast: Secondary | ICD-10-CM

## 2016-04-25 DIAGNOSIS — Z8042 Family history of malignant neoplasm of prostate: Secondary | ICD-10-CM

## 2016-04-25 NOTE — Telephone Encounter (Addendum)
LM for Lisa [name] that Dr. Marko Plume said that no labs are needed prior to 04-29-16 visit unless she is feeling weak or having problems with Xeloda. If she is experiencing any bad back pain now,  she will need imaging prior to PET. Requested that she call back with this information.

## 2016-04-25 NOTE — Telephone Encounter (Signed)
Lisa [name]' pain is a 7-8/10 some days but usually a 5-6/10. She takes tylenol 325 mg for pain daily and uses none to 1 tramadol 50 mg tab when pain is ~7-8/10. She is not experiencing any difficulty moving limbs. Discussed using the Tramadol at hs and possibly 1/2 to 1 tablet during the day when her pain is 4-5/10. Pt stated that she had not thought of this dosing.  She may try this to help keep pain level 1-2/10. She is tolerating the Xeloda very well. No N/V or diarrhea. She will keep appointment with Dr. Marko Plume on 04-29-16 as scheduled along with Zometa infusion.

## 2016-04-28 ENCOUNTER — Other Ambulatory Visit: Payer: Self-pay | Admitting: Oncology

## 2016-04-29 ENCOUNTER — Ambulatory Visit: Payer: Medicare Other

## 2016-04-29 ENCOUNTER — Ambulatory Visit (HOSPITAL_BASED_OUTPATIENT_CLINIC_OR_DEPARTMENT_OTHER): Payer: Medicare Other | Admitting: Oncology

## 2016-04-29 ENCOUNTER — Other Ambulatory Visit (HOSPITAL_BASED_OUTPATIENT_CLINIC_OR_DEPARTMENT_OTHER): Payer: Medicare Other

## 2016-04-29 VITALS — BP 146/76 | HR 85 | Temp 97.9°F | Resp 18 | Ht 63.0 in | Wt 167.9 lb

## 2016-04-29 DIAGNOSIS — C782 Secondary malignant neoplasm of pleura: Secondary | ICD-10-CM

## 2016-04-29 DIAGNOSIS — R300 Dysuria: Secondary | ICD-10-CM

## 2016-04-29 DIAGNOSIS — M545 Low back pain: Secondary | ICD-10-CM | POA: Diagnosis not present

## 2016-04-29 DIAGNOSIS — C7951 Secondary malignant neoplasm of bone: Secondary | ICD-10-CM | POA: Diagnosis not present

## 2016-04-29 DIAGNOSIS — C50911 Malignant neoplasm of unspecified site of right female breast: Secondary | ICD-10-CM | POA: Diagnosis not present

## 2016-04-29 DIAGNOSIS — G893 Neoplasm related pain (acute) (chronic): Secondary | ICD-10-CM

## 2016-04-29 DIAGNOSIS — C50919 Malignant neoplasm of unspecified site of unspecified female breast: Secondary | ICD-10-CM

## 2016-04-29 LAB — CBC WITH DIFFERENTIAL/PLATELET
BASO%: 1.3 % (ref 0.0–2.0)
BASOS ABS: 0.1 10*3/uL (ref 0.0–0.1)
EOS ABS: 0.3 10*3/uL (ref 0.0–0.5)
EOS%: 4.7 % (ref 0.0–7.0)
HCT: 37.7 % (ref 34.8–46.6)
HGB: 12.8 g/dL (ref 11.6–15.9)
LYMPH%: 20.7 % (ref 14.0–49.7)
MCH: 31.7 pg (ref 25.1–34.0)
MCHC: 34 g/dL (ref 31.5–36.0)
MCV: 93.2 fL (ref 79.5–101.0)
MONO#: 0.5 10*3/uL (ref 0.1–0.9)
MONO%: 10.2 % (ref 0.0–14.0)
NEUT%: 63.1 % (ref 38.4–76.8)
NEUTROS ABS: 3.4 10*3/uL (ref 1.5–6.5)
PLATELETS: 235 10*3/uL (ref 145–400)
RBC: 4.04 10*6/uL (ref 3.70–5.45)
RDW: 14.7 % — ABNORMAL HIGH (ref 11.2–14.5)
WBC: 5.3 10*3/uL (ref 3.9–10.3)
lymph#: 1.1 10*3/uL (ref 0.9–3.3)

## 2016-04-29 LAB — COMPREHENSIVE METABOLIC PANEL
ALBUMIN: 3.6 g/dL (ref 3.5–5.0)
ALK PHOS: 120 U/L (ref 40–150)
ALT: 12 U/L (ref 0–55)
ANION GAP: 11 meq/L (ref 3–11)
AST: 28 U/L (ref 5–34)
BILIRUBIN TOTAL: 1.15 mg/dL (ref 0.20–1.20)
BUN: 16.7 mg/dL (ref 7.0–26.0)
CO2: 23 meq/L (ref 22–29)
Calcium: 10.7 mg/dL — ABNORMAL HIGH (ref 8.4–10.4)
Chloride: 107 mEq/L (ref 98–109)
Creatinine: 1.3 mg/dL — ABNORMAL HIGH (ref 0.6–1.1)
EGFR: 40 mL/min/{1.73_m2} — AB (ref >90)
Glucose: 134 mg/dl (ref 70–140)
POTASSIUM: 3 meq/L — AB (ref 3.5–5.1)
Sodium: 141 mEq/L (ref 136–145)
TOTAL PROTEIN: 7.2 g/dL (ref 6.4–8.3)

## 2016-04-29 LAB — URINALYSIS, MICROSCOPIC - CHCC
BILIRUBIN (URINE): NEGATIVE
GLUCOSE UR CHCC: NEGATIVE mg/dL
Ketones: NEGATIVE mg/dL
Leukocyte Esterase: NEGATIVE
NITRITE: NEGATIVE
PH: 6 (ref 4.6–8.0)
PROTEIN: 30 mg/dL
Specific Gravity, Urine: 1.015 (ref 1.003–1.035)
UROBILINOGEN UR: 0.2 mg/dL (ref 0.2–1)

## 2016-04-29 NOTE — Progress Notes (Signed)
OFFICE PROGRESS NOTE   April 29, 2016   Physicians: J.Kinard, [name], R.Donneta Romberg, A.Kendall. K.Cabbell  INTERVAL HISTORY:  Patient is seen, together with mother, in continuing attention to metastatic breast cancer extensively involving bone and left pleura, on xeloda since 01-05-16 and zometa. Xeloda DCd today due to poor tolerance and probable progression; zometa held today with hypokalemia and higher creatinine.  Last zometa 04-01-16 PET scheduled for 05-02-16 (appointment moved from earlier at patient's request) She had follow up with Dr Christella Noa in [place], that note requested.  Patient resumed xeloda on 04-23-16, but is more fatigued and having more nausea. She used zofran for first time since June, "stopped nausea in 15 min". Pain is mid to low back and hips mostly, but has used no pain medication since tramadol 4 days ago. She is more SOB with activity. She does not notice weakness in LE, no cough/ sputum/ hemoptysis. No fever. She is voiding every 2 hrs thru night, no dysuria, feels that she does empty bladder. Bowels are moving without diarrhea. She is eating some and drinking fluids. No LE swelling.  No bleeding. No HA. Environmental allergies not bothersome. No mucositis Remainder of 10 point Review of Systems negative.   No flu vaccine  No PAC Genetics 03-2016 ([facility], 28 gene panel) with one VUS otherwise negative including BRCA  ONCOLOGIC HISTORY The right breast carcinoma initially was T2 N1, ER/PR-positive in December 1998, treated with mastectomy with 12 node axillary evaluation and adjuvant Adriamycin/Cytoxan followed by 5 years of tamoxifen through December 2003. 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 CA 2729 was 434 in April 2010. She has tolerated Femara well and clinically has continued to do very well, though CA 27.29 has never  normalized. 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 Patient seen by medical oncology 08-10-15 and radiation begun shortly afterwards by Dr Sondra Come, 22.5 of planned 35 Gy radiation to area of T10 thru 08-29-15. She had first zometa 08-11-15. MRI T spine obtained after some difficulty on 08-29-15, with pathologic compression fracture T10 with retropulsion of compressed vertebra to right with compression of cord called, involvement also T9, T11, L2. She had thoracic six- lumbar one posterolateral arthrodesis, T10 laminectomy, partial laminectomy T9, T11 by Dr Christella Noa on 09-01-15. She was begun on Aromasin on 09-07-15.  PET had been scheduled for 09-04-15, delayed with surgery, accomplished on 09-20-15 with chest adenopathy, increased soft tissue right lung base, extensive bone involvement.  Repeat PET 12-22-15 some improvement in area of radiation T spine, otherwise no improvement in bones, some increase right femur, some central chest adenopathy and right lower chest/ pleural involvement. Aromasin was discontinued and xeloda begun 01-05-16, used thru 04-29-16.  BRCA negative by [facility] 28 gene panel by [facility] 03-2016.  Objective:  Vital signs in last 24 hours:  BP (!) 146/76 (BP Location: Left Arm, Patient Position: Sitting)   Pulse 85   Temp 97.9 F (36.6 C) (Oral)   Resp 18   Ht 5' 3"  (1.6 m)   Wt 167 lb 14.4 oz (76.2 kg)   SpO2 100%   BMI 29.74 kg/m  Weight up 3 lbs. Looks at least mildly uncomfortable and more anxious. Respirations slightly labored with activity in exam room. No coughing.  Alert, oriented and appropriate. Ambulatory slowly.  No alopecia  HEENT:PERRL, sclerae  not icteric. Oral mucosa moist without lesions, posterior pharynx clear.  No JVD.  Lymphatics:no cervical,supraclavicular adenopathy Resp: decreased BS and dullness to percussion right lower 1/4, somewhat diminished BS thruout otherwise clear to auscultation  bilaterally  Cardio: regular rate and rhythm. No gallop. GI: soft, nontender, not distended, no mass or organomegaly. Some bowel sounds.  Musculoskeletal/ Extremities:LE without pitting edema, cords, tenderness Neuro: speech fluent. Moves all extremities equally Skin without rash, ecchymosis, petechiae   Lab Results:  Results for orders placed or performed in visit on 04/29/16  CBC with Differential  Result Value Ref Range   WBC 5.3 3.9 - 10.3 10e3/uL   NEUT# 3.4 1.5 - 6.5 10e3/uL   HGB 12.8 11.6 - 15.9 g/dL   HCT 37.7 34.8 - 46.6 %   Platelets 235 145 - 400 10e3/uL   MCV 93.2 79.5 - 101.0 fL   MCH 31.7 25.1 - 34.0 pg   MCHC 34.0 31.5 - 36.0 g/dL   RBC 4.04 3.70 - 5.45 10e6/uL   RDW 14.7 (H) 11.2 - 14.5 %   lymph# 1.1 0.9 - 3.3 10e3/uL   MONO# 0.5 0.1 - 0.9 10e3/uL   Eosinophils Absolute 0.3 0.0 - 0.5 10e3/uL   Basophils Absolute 0.1 0.0 - 0.1 10e3/uL   NEUT% 63.1 38.4 - 76.8 %   LYMPH% 20.7 14.0 - 49.7 %   MONO% 10.2 0.0 - 14.0 %   EOS% 4.7 0.0 - 7.0 %   BASO% 1.3 0.0 - 2.0 %  Comprehensive metabolic panel  Result Value Ref Range   Sodium 141 136 - 145 mEq/L   Potassium 3.0 (LL) 3.5 - 5.1 mEq/L   Chloride 107 98 - 109 mEq/L   CO2 23 22 - 29 mEq/L   Glucose 134 70 - 140 mg/dl   BUN 16.7 7.0 - 26.0 mg/dL   Creatinine 1.3 (H) 0.6 - 1.1 mg/dL   Total Bilirubin 1.15 0.20 - 1.20 mg/dL   Alkaline Phosphatase 120 40 - 150 U/L   AST 28 5 - 34 U/L   ALT 12 0 - 55 U/L   Total Protein 7.2 6.4 - 8.3 g/dL   Albumin 3.6 3.5 - 5.0 g/dL   Calcium 10.7 (H) 8.4 - 10.4 mg/dL   Anion Gap 11 3 - 11 mEq/L   EGFR 40 (L) >90 ml/min/1.73 m2    Markers available after visit with CA 2729  353, compared with 309 on 03-29-16 and 318 on 03-04-16 CA 15-3   Now 281, compared with 275 in Sept and 252 in Aug.  UA C&S sent now  Studies/Results:  No results found.  PET set up for 05-02-16  Medications: I have reviewed the patient's current medications. Increase OTC potassium to 2 daily Fine to  use zofran as prescribed for nausea Still not using tramadol adequately for degree of pain, last dose 3 days ago. Discussed and again encouraged her to use this 2-3x daily  DISCUSSION  Not tolerating present xeloda, hypokalemic and higher creatinine so will hold zometa today. Expect we will find progression on PET, which is to be done on 05-02-16; I will call her with PET results and will decide next plans from there. She would be willing to resume IV chemo if needed. May need to ask Dr Sondra Come to see again.  Increase potassium, tho I am not clear about OTC dose; if remains hypokalemic should change to prescription. Follow up urine results, previous UTIs  Meds as above    Assessment/Plan: 1.progressive metastatic breast cancer  involving right lung base/ pleura, central chest adenopathy, and extensive bone involvement: pathologic compression fracture T10 and retropulsion of bone impinging on spinal cord for which she had T6 - L1 posterolateral arthrodesis with T10 laminectomy and T9/ T11 partial laminectomy by Dr Christella Noa on 09-01-15.  No significant improvement on Aromasin x 4 months, DC 01-02-16. Oral xeloda since June, initially dose reduced and has not tolerated attempt at dose increase last 2 cycles.  Radiation to right hip/ right femur previously was helpful. Due zometa now but will hold today as above. PET to be done 05-02-16 2,Several recent UTIs: symptoms better with short course cipro for most recent Klebsiella. Nocturia now, repeat urine pending 3 minimal elevation in.calcemia todaywith albumin now normal. Will follow and resume zometa soon 4.Potential adverse interaction xeloda and cozaar, cozaar DCd on 01-03-16. BPs good, watching closely at home  5.diarrhea Jan treated for C diff ( Ab + toxin -) and resolved. 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  8.Advance Directives in place 9.needs flu  vaccine    All questions answered and she knows to call if needed. Time spent 25 min including >50% counseling and coordination of care. Route Dr Philip Aspen, cc Dr Christella Noa  Evlyn Clines, MD   04/29/2016, 3:01 PM

## 2016-04-30 LAB — CANCER ANTIGEN 27.29: CAN 27.29: 353 U/mL — AB (ref 0.0–38.6)

## 2016-04-30 LAB — URINE CULTURE

## 2016-04-30 LAB — CANCER ANTIGEN 15-3: CAN 15 3: 281.6 U/mL — AB (ref 0.0–25.0)

## 2016-05-01 ENCOUNTER — Telehealth: Payer: Self-pay

## 2016-05-01 NOTE — Telephone Encounter (Signed)
Told Ms Suter  that she did not have a UTI  as noted below by Dr. Marko Plume.

## 2016-05-01 NOTE — Telephone Encounter (Signed)
-----   Message from Gordy Levan, MD sent at 05/01/2016  9:24 AM EDT ----- Labs seen and need follow up: please let her know no bladder infection

## 2016-05-02 ENCOUNTER — Ambulatory Visit (HOSPITAL_COMMUNITY)
Admission: RE | Admit: 2016-05-02 | Discharge: 2016-05-02 | Disposition: A | Discharge status: Home / Self Care | Payer: Medicare Other | Source: Ambulatory Visit | Attending: Oncology | Admitting: Oncology

## 2016-05-02 DIAGNOSIS — C50911 Malignant neoplasm of unspecified site of right female breast: Secondary | ICD-10-CM | POA: Insufficient documentation

## 2016-05-02 DIAGNOSIS — J984 Other disorders of lung: Secondary | ICD-10-CM | POA: Insufficient documentation

## 2016-05-02 DIAGNOSIS — C7951 Secondary malignant neoplasm of bone: Secondary | ICD-10-CM | POA: Insufficient documentation

## 2016-05-02 DIAGNOSIS — R590 Localized enlarged lymph nodes: Secondary | ICD-10-CM | POA: Diagnosis not present

## 2016-05-02 LAB — GLUCOSE, CAPILLARY: Glucose-Capillary: 102 mg/dL — ABNORMAL HIGH (ref 65–99)

## 2016-05-02 MED ORDER — FLUDEOXYGLUCOSE F - 18 (FDG) INJECTION
8.2100 | Freq: Once | INTRAVENOUS | Status: AC | PRN
  Administered 2016-05-02: 8.21 via INTRAVENOUS

## 2016-05-03 ENCOUNTER — Other Ambulatory Visit: Payer: Self-pay | Admitting: Oncology

## 2016-05-03 ENCOUNTER — Telehealth: Payer: Self-pay | Admitting: *Deleted

## 2016-05-03 ENCOUNTER — Telehealth: Payer: Self-pay | Admitting: Oncology

## 2016-05-03 ENCOUNTER — Telehealth: Payer: Self-pay

## 2016-05-03 MED ORDER — DEXAMETHASONE 4 MG PO TABS: ORAL_TABLET | ORAL | 2 refills | Status: DC

## 2016-05-03 MED FILL — DEXAMETHASONE 4 MG TABLET: 4 | 3 days supply | Qty: 20 | Fill #0

## 2016-05-03 NOTE — Telephone Encounter (Signed)
Per MD I have scheduled appt, MD will give patient appt

## 2016-05-03 NOTE — Telephone Encounter (Signed)
-----   Message from Gordy Levan, MD sent at 05/03/2016  8:31 AM EDT ----- I talked with patient re progressin on PET  WIll start weekly taxol next week.  Needs teaching by RN by phone today please. WIll use decadron 20 mg with food 12 hrs and 6 hrs before first dose, then just 12 hrs prior if does ok first time.  #20 for 3 weeks  2 RF If able to give her the times for decadron can do that today, otherwise please follow up after chemo is scheduled.  I am also asking Dr Sondra Come to see her again  Thank you

## 2016-05-03 NOTE — Progress Notes (Signed)
START OFF PATHWAY REGIMEN - Breast  Off Pathway: Paclitaxel 80 mg/m2 Weekly  OFF00010:Paclitaxel 80 mg/m2 Weekly:   Administer weekly:     Paclitaxel (Taxol(R)) 80 mg/m2 in 250 mL NS IV over 1 hour Dose Mod: None  **Always confirm dose/schedule in your pharmacy ordering system**    Patient Characteristics: Metastatic Chemotherapy, HER2/neu Negative/Unknown/Equivocal, ER +, Fourth Line and Beyond, Prior Anthracycline or Anthracycline Contraindicated, Prior Taxane, AND No Prior Eribulin AJCC Stage Grouping: IV Current Disease Status: Distant Metastases AJCC M Stage: X ER Status: Positive (+) AJCC N Stage: X AJCC T Stage: X HER2/neu: Unknown PR Status: Positive (+) Line of therapy: Fourth Line and Beyond Would you be surprised if this patient died  in the next year? I would NOT be surprised if this patient died in the next year  Intent of Therapy: Non-Curative / Palliative Intent, Discussed with Patient 

## 2016-05-03 NOTE — Telephone Encounter (Signed)
S/w pt re: decadron and timing, taxol education done. Pt wrote down information. Confirmed appt date/time.

## 2016-05-03 NOTE — Telephone Encounter (Signed)
Spoke with pt to confirm all appt date/times per [facility]

## 2016-05-06 ENCOUNTER — Other Ambulatory Visit: Payer: Self-pay | Admitting: Oncology

## 2016-05-07 ENCOUNTER — Other Ambulatory Visit: Payer: Self-pay | Admitting: Oncology

## 2016-05-07 ENCOUNTER — Encounter: Payer: Self-pay | Admitting: Oncology

## 2016-05-07 DIAGNOSIS — C782 Secondary malignant neoplasm of pleura: Secondary | ICD-10-CM

## 2016-05-07 DIAGNOSIS — C50911 Malignant neoplasm of unspecified site of right female breast: Secondary | ICD-10-CM | POA: Insufficient documentation

## 2016-05-07 DIAGNOSIS — C7951 Secondary malignant neoplasm of bone: Secondary | ICD-10-CM

## 2016-05-07 DIAGNOSIS — C50919 Malignant neoplasm of unspecified site of unspecified female breast: Secondary | ICD-10-CM | POA: Insufficient documentation

## 2016-05-08 ENCOUNTER — Other Ambulatory Visit (HOSPITAL_BASED_OUTPATIENT_CLINIC_OR_DEPARTMENT_OTHER): Payer: Medicare Other

## 2016-05-08 ENCOUNTER — Ambulatory Visit
Admission: RE | Admit: 2016-05-08 | Discharge: 2016-05-08 | Disposition: A | Discharge status: Home / Self Care | Payer: Medicare Other | Source: Ambulatory Visit | Attending: Radiation Oncology | Admitting: Radiation Oncology

## 2016-05-08 ENCOUNTER — Encounter: Payer: Self-pay | Admitting: Radiation Oncology

## 2016-05-08 ENCOUNTER — Other Ambulatory Visit: Payer: Self-pay | Admitting: Oncology

## 2016-05-08 VITALS — BP 148/85 | HR 79 | Temp 99.1°F | Ht 63.0 in | Wt 167.2 lb

## 2016-05-08 DIAGNOSIS — C50911 Malignant neoplasm of unspecified site of right female breast: Secondary | ICD-10-CM

## 2016-05-08 DIAGNOSIS — I517 Cardiomegaly: Secondary | ICD-10-CM | POA: Insufficient documentation

## 2016-05-08 DIAGNOSIS — Z9011 Acquired absence of right breast and nipple: Secondary | ICD-10-CM | POA: Diagnosis not present

## 2016-05-08 DIAGNOSIS — C50919 Malignant neoplasm of unspecified site of unspecified female breast: Secondary | ICD-10-CM | POA: Insufficient documentation

## 2016-05-08 DIAGNOSIS — C7951 Secondary malignant neoplasm of bone: Principal | ICD-10-CM

## 2016-05-08 DIAGNOSIS — J90 Pleural effusion, not elsewhere classified: Secondary | ICD-10-CM | POA: Insufficient documentation

## 2016-05-08 DIAGNOSIS — Z90710 Acquired absence of both cervix and uterus: Secondary | ICD-10-CM | POA: Insufficient documentation

## 2016-05-08 DIAGNOSIS — I700 Atherosclerosis of aorta: Secondary | ICD-10-CM | POA: Insufficient documentation

## 2016-05-08 DIAGNOSIS — R599 Enlarged lymph nodes, unspecified: Secondary | ICD-10-CM | POA: Diagnosis not present

## 2016-05-08 LAB — CBC WITH DIFFERENTIAL/PLATELET
BASO%: 0.8 % (ref 0.0–2.0)
Basophils Absolute: 0.1 10*3/uL (ref 0.0–0.1)
EOS ABS: 0.3 10*3/uL (ref 0.0–0.5)
EOS%: 4.7 % (ref 0.0–7.0)
HCT: 38.3 % (ref 34.8–46.6)
HEMOGLOBIN: 13 g/dL (ref 11.6–15.9)
LYMPH%: 15.7 % (ref 14.0–49.7)
MCH: 32 pg (ref 25.1–34.0)
MCHC: 33.8 g/dL (ref 31.5–36.0)
MCV: 94.6 fL (ref 79.5–101.0)
MONO#: 0.8 10*3/uL (ref 0.1–0.9)
MONO%: 13.1 % (ref 0.0–14.0)
NEUT%: 65.7 % (ref 38.4–76.8)
NEUTROS ABS: 4 10*3/uL (ref 1.5–6.5)
PLATELETS: 197 10*3/uL (ref 145–400)
RBC: 4.05 10*6/uL (ref 3.70–5.45)
RDW: 15.4 % — AB (ref 11.2–14.5)
WBC: 6.1 10*3/uL (ref 3.9–10.3)
lymph#: 1 10*3/uL (ref 0.9–3.3)

## 2016-05-08 LAB — COMPREHENSIVE METABOLIC PANEL
ALT: 9 U/L (ref 0–55)
AST: 29 U/L (ref 5–34)
Albumin: 3.6 g/dL (ref 3.5–5.0)
Alkaline Phosphatase: 128 U/L (ref 40–150)
Anion Gap: 9 mEq/L (ref 3–11)
BUN: 20.6 mg/dL (ref 7.0–26.0)
CO2: 24 mEq/L (ref 22–29)
Calcium: 11.8 mg/dL — ABNORMAL HIGH (ref 8.4–10.4)
Chloride: 106 mEq/L (ref 98–109)
Creatinine: 1.2 mg/dL — ABNORMAL HIGH (ref 0.6–1.1)
EGFR: 43 mL/min/{1.73_m2} — ABNORMAL LOW (ref >90)
GLUCOSE: 101 mg/dL (ref 70–140)
POTASSIUM: 3.5 meq/L (ref 3.5–5.1)
SODIUM: 139 meq/L (ref 136–145)
Total Bilirubin: 1.32 mg/dL — ABNORMAL HIGH (ref 0.20–1.20)
Total Protein: 7.3 g/dL (ref 6.4–8.3)

## 2016-05-08 NOTE — Progress Notes (Signed)
Radiation Oncology         [phone]) [phone] ________________________________  Name: Lisa [name] MRN: [id number]  Date: 05/08/2016  DOB: Jan 16, 1941  Re-Eval Visit Note  CC: [name], MD  Gordy Levan, MD    ICD-9-CM ICD-10-CM   1. Carcinoma of breast metastatic to bone, unspecified laterality (HCC) 174.9 C50.919    198.5 C79.51     Diagnosis:   Metastatic breast cancer with osseous metastasis  Interval Since Last Radiation:  01/17/2016-01/24/2016, about 3 months,  directed at the right proximal and mid-femur   Narrative:  The patient is here today for a re-consult at the kind recommendation of Dr. Marko Plume.  The patient reports that she has been experiencing pain in her right hip/pelvis region, that is exacerbated by physical activity. The patient reports that if she is laying down for too long, she begins wheezing. The patient denies coughing while doing this, but does cough generally. The patient is scheduled to start a protocol of chemotherapy tomorrow.                              ALLERGIES:  is allergic to diphenhydramine hcl; codeine sulfate; oseltamivir phosphate; oxycodone-acetaminophen; prednisone; and sudafed [pseudoephedrine hcl].  Meds: Current Outpatient Prescriptions  Medication Sig Dispense Refill  . acetaminophen (TYLENOL) 325 MG tablet Take 325 mg by mouth every 6 (six) hours as needed. Reported on 01/10/2016    . loratadine (CLARITIN) 10 MG tablet Take 10 mg by mouth daily.     . ondansetron (ZOFRAN) 8 MG tablet Take 1 tablet (8 mg total) by mouth every 8 (eight) hours as needed for nausea or vomiting. 20 tablet 2  . Potassium 99 MG TABS Take 1 tablet by mouth daily.    . simvastatin (ZOCOR) 20 MG tablet Take 20 mg by mouth at bedtime.      . traMADol (ULTRAM) 50 MG tablet Take 50-100 mg by mouth every 8 (eight) hours as needed for moderate pain.    . vitamin B-12 (CYANOCOBALAMIN) 1000 MCG tablet Take 1,000 mcg by mouth 2 (two) times daily.    . vitamin  C (ASCORBIC ACID) 500 MG tablet Take 500 mg by mouth daily.      . vitamin E 200 UNIT capsule Take 200 Units by mouth daily.      . capecitabine (XELODA) 500 MG tablet Take 2  tablet twice a day for 14 days.  Every 21 days. (Patient not taking: Reported on 05/08/2016) 56 tablet 0  . dexamethasone (DECADRON) 4 MG tablet Take 5 tabs (20 mg) 12 hours and 6 hours before taxol for first chemo. Take 5 tabs (20mg ) 12 hours before taxol for subsequent treatment. (Patient not taking: Reported on 05/08/2016) 20 tablet 2  . ferrous sulfate 325 (65 FE) MG tablet Take 325 mg by mouth 2 (two) times a week. Reported on 02/05/2016    . Multiple Vitamins-Minerals (MULTIVITAMIN WITH MINERALS) tablet Take 1 tablet by mouth daily. Reported on 02/05/2016     No current facility-administered medications for this encounter.     Physical Findings: The patient is in no acute distress. Patient is alert and oriented.  height is 5\' 3"  (1.6 m) and weight is 167 lb 3.2 oz (75.8 kg). Her oral temperature is 99.1 F (37.3 C). Her blood pressure is 148/85 (abnormal) and her pulse is 79. Her oxygen saturation is 100%. .  No significant changes. Lungs are clear to auscultation bilaterally. Heart  has regular rate and rhythm. No palpable cervical, supraclavicular, or axillary adenopathy. Abdomen soft, non-tender, normal bowel sounds. Patient has pain with palpation along the right iliac crest, also pain when raising her right leg up.  Motor strength is 5/5 in the proximal and distal muscle groups in the lower extremities.     Lab Findings: Lab Results  Component Value Date   WBC 5.3 04/29/2016   HGB 12.8 04/29/2016   HCT 37.7 04/29/2016   MCV 93.2 04/29/2016   PLT 235 04/29/2016    Radiographic Findings: Nm Pet Image Restag (ps) Skull Base To Thigh  Result Date: 05/02/2016 CLINICAL DATA:  Subsequent treatment strategy for metastatic breast cancer. EXAM: NUCLEAR MEDICINE PET SKULL BASE TO THIGH TECHNIQUE: 8.21 mCi F-18 FDG  was injected intravenously. Full-ring PET imaging was performed from the skull base to thigh after the radiotracer. CT data was obtained and used for attenuation correction and anatomic localization. FASTING BLOOD GLUCOSE:  Value: 102 mg/dl COMPARISON:  PET-CT 12/22/2015. FINDINGS: NECK No hypermetabolic lymph nodes in the neck. However, there is some poorly defined infiltrative appearing soft tissue haziness in the in lower neck adjacent to the thyroid gland which is diffusely hypermetabolic (SUVmax = 6.3). CHEST Numerous pulmonary nodules are again noted throughout the lungs bilaterally, generally increased in size and number compared to the prior examination, with the largest of these nodules in the right lung associated with the right major fissure (image 31 of series 7) measuring 13 x 16 mm (SUVmax = 29.1). Large partially calcified pleural-based mass in the inferior aspect of the right lower lobe appear is similar to the prior examination, and is again hypermetabolic (SUVmax = [id number]). Several hypermetabolic bilateral hilar and mediastinal lymph nodes are noted, with the most hypermetabolism in a subcarinal lymph node (SUVmax = 8.1), which appears borderline enlarged measuring approximately 8 mm. Mild cardiomegaly. There is aortic atherosclerosis, as well as atherosclerosis of the great vessels of the mediastinum and the coronary arteries, including calcified atherosclerotic plaque in the left anterior descending and right coronary arteries. Calcifications of the aortic valve. Small left pleural effusion. Mild diffuse interlobular septal thickening in the lungs, favored to reflect a background of very mild interstitial pulmonary edema. ABDOMEN/PELVIS There are some patchy areas of heterogeneous increased metabolic activity in the liver, most of which have no discrete CT correlate. However, there are some tiny capsular areas overlying the right lobe of the liver that are partially calcified (image 93 of series 4)  which correspond to hypermetabolic lesions (SUVmax = 9.6)on the PET portion of the examination, concerning for hepatic metastasis. No abnormal hypermetabolic activity within the pancreas, adrenal glands, or spleen. In the distal right common iliac nodal chain there is an enlarged lymph node measuring 12 mm in short axis (image 134 of series 4) which is hypermetabolic (SUVmax = 6.9), similar to the prior examination. Low-attenuation lesion in the lower pole of the left kidney is incompletely characterized, but presumably a parapelvic cysts, similar to the prior study. No significant volume of ascites. No pneumoperitoneum. No pathologic dilatation of small bowel or colon. Status post hysterectomy. Ovaries are not confidently identified may be surgically absent or atrophic. SKELETON Innumerable hypermetabolic lesions are noted throughout the visualized axial and appendicular skeleton. Many of these are old lesions seen on prior examinations, however, these generally appear increased in size and number. Specific examples include new lesions in the right skull base just lateral to the foramen magnum (SUVmax = 10.8), a much larger hypermetabolic lesion (SUVmax =  20.0)which is mixed lytic and sclerotic in the right scapular glenoid(image 44 of series 4), and persistent increase in areas of irregular lucency and sclerosis throughout the sacrum which are diffusely hypermetabolic (SUVmax = [id number]). Multiple other osseous lesions are also noted. Orthopedic fixation hardware in the lower thoracic and upper lumbar spine again noted. Surgical clips in the right axillary region from prior lymph node dissection. Status post right modified radical mastectomy. IMPRESSION: 1. Today's study demonstrates general progression of disease, as evidenced by increased number and size of numerous osseous lesions and pulmonary lesions, as discussed above. 2. Additional incidental findings, as above. Electronically Signed   By: Vinnie Langton  M.D.   On: 05/02/2016 14:43    Impression:  Derian is a 75 year old caucasian female with recurrent metastatic breast cancer with osseous mets. Seems to be most symptomatic from her disease along the right iliac bone region. She would be a good candidate for short course radiation therapy directed to this area.   Plan:  The patient indicated that she would like to proceed with her treatment. She will come for her CT simulation appointment on October 24th at 3:00 p.m.    We discussed the available radiation techniques, and focused on the details of logistics and delivery.  We reviewed the anticipated acute and late sequelae associated with radiation in this setting.  The patient was encouraged to ask questions that I answered to the best of my ability.  I filled out a patient counseling form during our discussion including treatment diagrams.  We retained a copy for our records.  The patient would like to proceed with radiation and will be scheduled for CT simulation (october 24th, 3:00pm). I anticipate 8 treatments.      ____________________________________  This document serves as a record of services personally performed by Gery Pray, MD. It was created on his behalf by Truddie Hidden, a trained medical scribe. The creation of this record is based on the scribe's personal observations and the provider's statements to them. This document has been checked and approved by the attending provider.

## 2016-05-08 NOTE — Progress Notes (Signed)
Histology and Location of Primary Cancer: metastatic breast cancer   Location(s) of Symptomatic Metastases: PET scan from 05/02/16 shows "general progression of disease, as evidenced by increased number and size of numerous osseous lesions and pulmonary lesions "  Past/Anticipated chemotherapy by medical oncology, if any: weekly taxol to start 05/09/16.  Pain on a scale of 0-10 is: 4 in her right pelvis/hip started 2 days ago.Lisa [name]  Also having pain in her lumbar spine at a 1/10 which started weeks ago..  She took tramadal twice yesterday.   If Spine Met(s), symptoms, if any, include:  Bowel/Bladder retention or incontinence (please describe): reports having nocturia 4-7 times per night.  She denies having any bowel issues.  Numbness or weakness in extremities (please describe): right leg is weaker.  Current Decadron regimen, if applicable: taking decadron before taxol  Ambulatory status? Walker? Wheelchair?: walker  SAFETY ISSUES:  Prior radiation? 08/17/2015-08/29/2015 lower thoracic spine 22.5 gray,01/17/16-01/24/16 right femur 20Gy    Pacemaker/ICD? no  Possible current pregnancy? no  Is the patient on methotrexate? no  Current Complaints / other details:  She reports that she is not feeling well today.  She had some nausea this morning.  She also reports having to sleep sitting up for the past 2 nights because she was not able to get up from her bed.  She reports having swelling her lower legs from sleeping sitting up.  BP (!) 148/85 (BP Location: Left Arm, Patient Position: Sitting)   Pulse 79   Temp 99.1 F (37.3 C) (Oral)   Ht 5' 3"  (1.6 m)   Wt 167 lb 3.2 oz (75.8 kg)   SpO2 100%   BMI 29.62 kg/m    Wt Readings from Last 3 Encounters:  05/08/16 167 lb 3.2 oz (75.8 kg)  04/29/16 167 lb 14.4 oz (76.2 kg)  04/01/16 164 lb 14.4 oz (74.8 kg)

## 2016-05-08 NOTE — Progress Notes (Signed)
Please see the Nurse Progress Note in the MD Initial Consult Encounter for this patient. 

## 2016-05-09 ENCOUNTER — Ambulatory Visit (HOSPITAL_BASED_OUTPATIENT_CLINIC_OR_DEPARTMENT_OTHER): Payer: Medicare Other

## 2016-05-09 ENCOUNTER — Other Ambulatory Visit: Payer: Medicare Other

## 2016-05-09 VITALS — BP 170/81 | HR 86 | Temp 97.7°F | Resp 16

## 2016-05-09 DIAGNOSIS — C50911 Malignant neoplasm of unspecified site of right female breast: Secondary | ICD-10-CM | POA: Diagnosis not present

## 2016-05-09 DIAGNOSIS — C782 Secondary malignant neoplasm of pleura: Secondary | ICD-10-CM

## 2016-05-09 DIAGNOSIS — Z5111 Encounter for antineoplastic chemotherapy: Secondary | ICD-10-CM

## 2016-05-09 DIAGNOSIS — C7951 Secondary malignant neoplasm of bone: Secondary | ICD-10-CM

## 2016-05-09 MED ORDER — ONDANSETRON HCL 8 MG PO TABS
8.0000 mg | ORAL_TABLET | Freq: Once | ORAL | Status: AC
  Administered 2016-05-09: 8 mg via ORAL

## 2016-05-09 MED ORDER — SODIUM CHLORIDE 0.9 % IV SOLN
Freq: Once | INTRAVENOUS | Status: AC
  Administered 2016-05-09: 09:00:00 via INTRAVENOUS

## 2016-05-09 MED ORDER — FAMOTIDINE IN NACL 20-0.9 MG/50ML-% IV SOLN
INTRAVENOUS | Status: AC
  Filled 2016-05-09: qty 50

## 2016-05-09 MED ORDER — LORATADINE 10 MG PO TABS
10.0000 mg | ORAL_TABLET | Freq: Every day | ORAL | Status: DC
  Filled 2016-05-09: qty 1

## 2016-05-09 MED ORDER — ONDANSETRON HCL 8 MG PO TABS
ORAL_TABLET | ORAL | Status: AC
  Filled 2016-05-09: qty 1

## 2016-05-09 MED ORDER — SODIUM CHLORIDE 0.9 % IV SOLN: Freq: Once | INTRAVENOUS | Status: DC

## 2016-05-09 MED ORDER — DEXAMETHASONE SODIUM PHOSPHATE 10 MG/ML IJ SOLN
INTRAMUSCULAR | Status: AC
  Filled 2016-05-09: qty 1

## 2016-05-09 MED ORDER — SODIUM CHLORIDE 0.9 % IV SOLN
80.0000 mg/m2 | Freq: Once | INTRAVENOUS | Status: AC
  Administered 2016-05-09: 150 mg via INTRAVENOUS
  Filled 2016-05-09: qty 25

## 2016-05-09 MED ORDER — DEXAMETHASONE SODIUM PHOSPHATE 10 MG/ML IJ SOLN
10.0000 mg | Freq: Once | INTRAMUSCULAR | Status: AC
  Administered 2016-05-09: 10 mg via INTRAVENOUS

## 2016-05-09 MED ORDER — FAMOTIDINE IN NACL 20-0.9 MG/50ML-% IV SOLN
20.0000 mg | Freq: Once | INTRAVENOUS | Status: AC
  Administered 2016-05-09: 20 mg via INTRAVENOUS

## 2016-05-09 MED ORDER — ONDANSETRON HCL 4 MG/2ML IJ SOLN
INTRAMUSCULAR | Status: AC
  Filled 2016-05-09: qty 4

## 2016-05-09 MED ORDER — SODIUM CHLORIDE 0.9 % IV SOLN: 10.0000 mg | Freq: Once | INTRAVENOUS | Status: DC

## 2016-05-09 NOTE — Patient Instructions (Signed)
[facility] Discharge Instructions for Patients Receiving Chemotherapy  Today you received the following chemotherapy agents Taxol  To help prevent nausea and vomiting after your treatment, we encourage you to take your nausea medication as directed.   If you develop nausea and vomiting that is not controlled by your nausea medication, call the clinic.   BELOW ARE SYMPTOMS THAT SHOULD BE REPORTED IMMEDIATELY:  *FEVER GREATER THAN 100.5 F  *CHILLS WITH OR WITHOUT FEVER  NAUSEA AND VOMITING THAT IS NOT CONTROLLED WITH YOUR NAUSEA MEDICATION  *UNUSUAL SHORTNESS OF BREATH  *UNUSUAL BRUISING OR BLEEDING  TENDERNESS IN MOUTH AND THROAT WITH OR WITHOUT PRESENCE OF ULCERS  *URINARY PROBLEMS  *BOWEL PROBLEMS  UNUSUAL RASH Items with * indicate a potential emergency and should be followed up as soon as possible.  Feel free to call the clinic you have any questions or concerns. The clinic phone number is [phone].  Please show the [facility] at check-in to the Emergency Department and triage nurse.   Paclitaxel injection What is this medicine? PACLITAXEL (PAK li TAX el) is a chemotherapy drug. It targets fast dividing cells, like cancer cells, and causes these cells to die. This medicine is used to treat ovarian cancer, breast cancer, and other cancers. This medicine may be used for other purposes; ask your health care provider or pharmacist if you have questions. What should I tell my health care provider before I take this medicine? They need to know if you have any of these conditions: -blood disorders -irregular heartbeat -infection (especially a virus infection such as chickenpox, cold sores, or herpes) -liver disease -previous or ongoing radiation therapy -an unusual or allergic reaction to paclitaxel, alcohol, polyoxyethylated castor oil, other chemotherapy agents, other medicines, foods, dyes, or preservatives -pregnant or trying to get  pregnant -breast-feeding How should I use this medicine? This drug is given as an infusion into a vein. It is administered in a hospital or clinic by a specially trained health care professional. Talk to your pediatrician regarding the use of this medicine in children. Special care may be needed. Overdosage: If you think you have taken too much of this medicine contact a poison control center or emergency room at once. NOTE: This medicine is only for you. Do not share this medicine with others. What if I miss a dose? It is important not to miss your dose. Call your doctor or health care professional if you are unable to keep an appointment. What may interact with this medicine? Do not take this medicine with any of the following medications: -disulfiram -metronidazole This medicine may also interact with the following medications: -cyclosporine -diazepam -ketoconazole -medicines to increase blood counts like filgrastim, pegfilgrastim, sargramostim -other chemotherapy drugs like cisplatin, doxorubicin, epirubicin, etoposide, teniposide, vincristine -quinidine -testosterone -vaccines -verapamil Talk to your doctor or health care professional before taking any of these medicines: -acetaminophen -aspirin -ibuprofen -ketoprofen -naproxen This list may not describe all possible interactions. Give your health care provider a list of all the medicines, herbs, non-prescription drugs, or dietary supplements you use. Also tell them if you smoke, drink alcohol, or use illegal drugs. Some items may interact with your medicine. What should I watch for while using this medicine? Your condition will be monitored carefully while you are receiving this medicine. You will need important blood work done while you are taking this medicine. This drug may make you feel generally unwell. This is not uncommon, as chemotherapy can affect healthy cells as well as  cancer cells. Report any side effects. Continue  your course of treatment even though you feel ill unless your doctor tells you to stop. This medicine can cause serious allergic reactions. To reduce your risk you will need to take other medicine(s) before treatment with this medicine. In some cases, you may be given additional medicines to help with side effects. Follow all directions for their use. Call your doctor or health care professional for advice if you get a fever, chills or sore throat, or other symptoms of a cold or flu. Do not treat yourself. This drug decreases your body's ability to fight infections. Try to avoid being around people who are sick. This medicine may increase your risk to bruise or bleed. Call your doctor or health care professional if you notice any unusual bleeding. Be careful brushing and flossing your teeth or using a toothpick because you may get an infection or bleed more easily. If you have any dental work done, tell your dentist you are receiving this medicine. Avoid taking products that contain aspirin, acetaminophen, ibuprofen, naproxen, or ketoprofen unless instructed by your doctor. These medicines may hide a fever. Do not become pregnant while taking this medicine. Women should inform their doctor if they wish to become pregnant or think they might be pregnant. There is a potential for serious side effects to an unborn child. Talk to your health care professional or pharmacist for more information. Do not breast-feed an infant while taking this medicine. Men are advised not to father a child while receiving this medicine. This product may contain alcohol. Ask your pharmacist or healthcare provider if this medicine contains alcohol. Be sure to tell all healthcare providers you are taking this medicine. Certain medicines, like metronidazole and disulfiram, can cause an unpleasant reaction when taken with alcohol. The reaction includes flushing, headache, nausea, vomiting, sweating, and increased thirst. The reaction  can last from 30 minutes to several hours. What side effects may I notice from receiving this medicine? Side effects that you should report to your doctor or health care professional as soon as possible: -allergic reactions like skin rash, itching or hives, swelling of the face, lips, or tongue -low blood counts - This drug may decrease the number of white blood cells, red blood cells and platelets. You may be at increased risk for infections and bleeding. -signs of infection - fever or chills, cough, sore throat, pain or difficulty passing urine -signs of decreased platelets or bleeding - bruising, pinpoint red spots on the skin, black, tarry stools, nosebleeds -signs of decreased red blood cells - unusually weak or tired, fainting spells, lightheadedness -breathing problems -chest pain -high or low blood pressure -mouth sores -nausea and vomiting -pain, swelling, redness or irritation at the injection site -pain, tingling, numbness in the hands or feet -slow or irregular heartbeat -swelling of the ankle, feet, hands Side effects that usually do not require medical attention (report to your doctor or health care professional if they continue or are bothersome): -bone pain -complete hair loss including hair on your head, underarms, pubic hair, eyebrows, and eyelashes -changes in the color of fingernails -diarrhea -loosening of the fingernails -loss of appetite -muscle or joint pain -red flush to skin -sweating This list may not describe all possible side effects. Call your doctor for medical advice about side effects. You may report side effects to FDA at 1-800-FDA-1088. Where should I keep my medicine? This drug is given in a hospital or clinic and will not be stored at home.  NOTE: This sheet is a summary. It may not cover all possible information. If you have questions about this medicine, talk to your doctor, pharmacist, or health care provider.    2016, [employer].  (2015-02-23 13:02:56)

## 2016-05-10 NOTE — Addendum Note (Signed)
Addended by: Neysa Hotter on: 05/10/2016 09:59 AM   Modules accepted: Orders

## 2016-05-14 ENCOUNTER — Ambulatory Visit
Admission: RE | Admit: 2016-05-14 | Discharge: 2016-05-14 | Disposition: A | Discharge status: Home / Self Care | Payer: Medicare Other | Source: Ambulatory Visit | Attending: Radiation Oncology | Admitting: Radiation Oncology

## 2016-05-14 DIAGNOSIS — C7951 Secondary malignant neoplasm of bone: Principal | ICD-10-CM

## 2016-05-14 DIAGNOSIS — C50919 Malignant neoplasm of unspecified site of unspecified female breast: Secondary | ICD-10-CM | POA: Diagnosis not present

## 2016-05-14 DIAGNOSIS — C50911 Malignant neoplasm of unspecified site of right female breast: Secondary | ICD-10-CM

## 2016-05-14 NOTE — Progress Notes (Signed)
  Radiation Oncology         [phone] ________________________________  Name: Lisa [name] MRN: [id number]  Date: 05/14/2016  DOB: 04/29/1941  SIMULATION AND TREATMENT PLANNING NOTE    ICD-9-CM ICD-10-CM   1. Carcinoma of breast metastatic to bone, right (HCC) 174.9 C50.911    198.5 C79.51     DIAGNOSIS:  Metastatic breast cancer with osseous metastasis   NARRATIVE:  The patient was brought to the [facility].  Identity was confirmed.  All relevant records and images related to the planned course of therapy were reviewed.  The patient freely provided informed written consent to proceed with treatment after reviewing the details related to the planned course of therapy. The consent form was witnessed and verified by the simulation staff.  Then, the patient was set-up in a stable reproducible  supine position for radiation therapy.  CT images were obtained.  Surface markings were placed.  The CT images were loaded into the planning software.  Then the target and avoidance structures were contoured.  Treatment planning then occurred.  The radiation prescription was entered and confirmed.  Then, I designed and supervised the construction of a total of 4 medically necessary complex treatment devices.  I have requested : 3D Simulation  I have requested a DVH of the following structures: GTV, PTV, right kidney, bowel.  I have ordered:dose calc.  PLAN:  The patient will receive 28 Gy in 8 fractions.  -----------------------------------  Blair Promise, PhD, MD  This document serves as a record of services personally performed by Gery Pray, MD. It was created on his behalf by Truddie Hidden, a trained medical scribe. The creation of this record is based on the scribe's personal observations and the provider's statements to them. This document has been checked and approved by the attending provider.

## 2016-05-15 ENCOUNTER — Telehealth: Payer: Self-pay

## 2016-05-15 ENCOUNTER — Other Ambulatory Visit: Payer: Self-pay | Admitting: Oncology

## 2016-05-15 NOTE — Telephone Encounter (Signed)
-----   Message from Gordy Levan, MD sent at 05/15/2016  3:13 PM EDT ----- First weekly taxol given 10-19. Took 2 doses decadron before first chemo.  I do not see follow up phone call. I do not see any problems with first taxol infusion in nursing notes  Please call her to let her know ok to take just one dose of decadron, five tablets = 20 mg with food 12 hrs prior to #2 chemo 10-26 at 1100.  I do see her before treatment 10-26  Thank you

## 2016-05-15 NOTE — Telephone Encounter (Signed)
Spoke with Ms Ranta and told her to take the 12 hour decadron premed only tonight at 11 pm. as noted below by Dr. Marko Plume.

## 2016-05-16 ENCOUNTER — Encounter: Payer: Self-pay | Admitting: Oncology

## 2016-05-16 ENCOUNTER — Other Ambulatory Visit (HOSPITAL_BASED_OUTPATIENT_CLINIC_OR_DEPARTMENT_OTHER): Payer: Medicare Other

## 2016-05-16 ENCOUNTER — Ambulatory Visit (HOSPITAL_BASED_OUTPATIENT_CLINIC_OR_DEPARTMENT_OTHER): Payer: Medicare Other | Admitting: Oncology

## 2016-05-16 ENCOUNTER — Ambulatory Visit (HOSPITAL_BASED_OUTPATIENT_CLINIC_OR_DEPARTMENT_OTHER): Payer: Medicare Other

## 2016-05-16 VITALS — BP 150/75 | HR 90 | Temp 98.3°F | Resp 18 | Ht 63.0 in | Wt 164.9 lb

## 2016-05-16 DIAGNOSIS — C7951 Secondary malignant neoplasm of bone: Secondary | ICD-10-CM

## 2016-05-16 DIAGNOSIS — C50911 Malignant neoplasm of unspecified site of right female breast: Secondary | ICD-10-CM | POA: Diagnosis not present

## 2016-05-16 DIAGNOSIS — C50919 Malignant neoplasm of unspecified site of unspecified female breast: Secondary | ICD-10-CM

## 2016-05-16 DIAGNOSIS — C7800 Secondary malignant neoplasm of unspecified lung: Secondary | ICD-10-CM

## 2016-05-16 DIAGNOSIS — G893 Neoplasm related pain (acute) (chronic): Secondary | ICD-10-CM

## 2016-05-16 DIAGNOSIS — C782 Secondary malignant neoplasm of pleura: Secondary | ICD-10-CM

## 2016-05-16 DIAGNOSIS — Z5111 Encounter for antineoplastic chemotherapy: Secondary | ICD-10-CM

## 2016-05-16 LAB — CBC WITH DIFFERENTIAL/PLATELET
BASO%: 0 % (ref 0.0–2.0)
Basophils Absolute: 0 10*3/uL (ref 0.0–0.1)
EOS%: 0 % (ref 0.0–7.0)
Eosinophils Absolute: 0 10*3/uL (ref 0.0–0.5)
HEMATOCRIT: 34.5 % — AB (ref 34.8–46.6)
HGB: 12.1 g/dL (ref 11.6–15.9)
LYMPH#: 0.5 10*3/uL — AB (ref 0.9–3.3)
LYMPH%: 24.7 % (ref 14.0–49.7)
MCH: 31.9 pg (ref 25.1–34.0)
MCHC: 35.1 g/dL (ref 31.5–36.0)
MCV: 91 fL (ref 79.5–101.0)
MONO#: 0 10*3/uL — ABNORMAL LOW (ref 0.1–0.9)
MONO%: 0.5 % (ref 0.0–14.0)
NEUT#: 1.4 10*3/uL — ABNORMAL LOW (ref 1.5–6.5)
NEUT%: 74.8 % (ref 38.4–76.8)
Platelets: 199 10*3/uL (ref 145–400)
RBC: 3.79 10*6/uL (ref 3.70–5.45)
RDW: 14.4 % (ref 11.2–14.5)
WBC: 1.8 10*3/uL — ABNORMAL LOW (ref 3.9–10.3)

## 2016-05-16 LAB — COMPREHENSIVE METABOLIC PANEL
ALT: 14 U/L (ref 0–55)
AST: 22 U/L (ref 5–34)
Albumin: 3.4 g/dL — ABNORMAL LOW (ref 3.5–5.0)
Alkaline Phosphatase: 121 U/L (ref 40–150)
Anion Gap: 10 mEq/L (ref 3–11)
BUN: 21.8 mg/dL (ref 7.0–26.0)
CHLORIDE: 109 meq/L (ref 98–109)
CO2: 18 meq/L — AB (ref 22–29)
CREATININE: 1.2 mg/dL — AB (ref 0.6–1.1)
Calcium: 10.5 mg/dL — ABNORMAL HIGH (ref 8.4–10.4)
EGFR: 46 mL/min/{1.73_m2} — ABNORMAL LOW (ref >90)
GLUCOSE: 240 mg/dL — AB (ref 70–140)
POTASSIUM: 3.8 meq/L (ref 3.5–5.1)
SODIUM: 137 meq/L (ref 136–145)
Total Bilirubin: 0.99 mg/dL (ref 0.20–1.20)
Total Protein: 7.1 g/dL (ref 6.4–8.3)

## 2016-05-16 MED ORDER — HEPARIN SOD (PORK) LOCK FLUSH 100 UNIT/ML IV SOLN
250.0000 [IU] | Freq: Once | INTRAVENOUS | Status: DC | PRN
  Filled 2016-05-16: qty 5

## 2016-05-16 MED ORDER — HEPARIN SOD (PORK) LOCK FLUSH 100 UNIT/ML IV SOLN
500.0000 [IU] | Freq: Once | INTRAVENOUS | Status: DC | PRN
  Filled 2016-05-16: qty 5

## 2016-05-16 MED ORDER — FAMOTIDINE IN NACL 20-0.9 MG/50ML-% IV SOLN
INTRAVENOUS | Status: AC
  Filled 2016-05-16: qty 50

## 2016-05-16 MED ORDER — SODIUM CHLORIDE 0.9 % IV SOLN: Freq: Once | INTRAVENOUS | Status: DC

## 2016-05-16 MED ORDER — DEXAMETHASONE SODIUM PHOSPHATE 10 MG/ML IJ SOLN
INTRAMUSCULAR | Status: AC
  Filled 2016-05-16: qty 1

## 2016-05-16 MED ORDER — SODIUM CHLORIDE 0.9 % IJ SOLN
10.0000 mL | INTRAMUSCULAR | Status: DC | PRN
  Filled 2016-05-16: qty 10

## 2016-05-16 MED ORDER — DEXAMETHASONE SODIUM PHOSPHATE 10 MG/ML IJ SOLN
10.0000 mg | Freq: Once | INTRAMUSCULAR | Status: AC
  Administered 2016-05-16: 10 mg via INTRAVENOUS

## 2016-05-16 MED ORDER — SODIUM CHLORIDE 0.9 % IV SOLN
60.0000 mg/m2 | Freq: Once | INTRAVENOUS | Status: AC
  Administered 2016-05-16: 108 mg via INTRAVENOUS
  Filled 2016-05-16: qty 18

## 2016-05-16 MED ORDER — LORATADINE 10 MG PO TABS
10.0000 mg | ORAL_TABLET | Freq: Every day | ORAL | Status: DC
  Filled 2016-05-16: qty 1

## 2016-05-16 MED ORDER — ZOLEDRONIC ACID 4 MG/5ML IV CONC: 4.0000 mg | Freq: Once | INTRAVENOUS | Status: DC

## 2016-05-16 MED ORDER — ONDANSETRON HCL 8 MG PO TABS
8.0000 mg | ORAL_TABLET | Freq: Once | ORAL | Status: AC
  Administered 2016-05-16: 8 mg via ORAL

## 2016-05-16 MED ORDER — ALTEPLASE 2 MG IJ SOLR
2.0000 mg | Freq: Once | INTRAMUSCULAR | Status: DC | PRN
  Filled 2016-05-16: qty 2

## 2016-05-16 MED ORDER — FAMOTIDINE IN NACL 20-0.9 MG/50ML-% IV SOLN
20.0000 mg | Freq: Once | INTRAVENOUS | Status: AC
  Administered 2016-05-16: 20 mg via INTRAVENOUS

## 2016-05-16 MED ORDER — SODIUM CHLORIDE 0.9 % IJ SOLN
3.0000 mL | Freq: Once | INTRAMUSCULAR | Status: DC | PRN
  Filled 2016-05-16: qty 10

## 2016-05-16 MED ORDER — ONDANSETRON HCL 8 MG PO TABS
ORAL_TABLET | ORAL | Status: AC
  Filled 2016-05-16: qty 1

## 2016-05-16 NOTE — Progress Notes (Signed)
OFFICE PROGRESS NOTE   May 16, 2016   Physicians: J.Kinard, [name], R.Donneta Romberg, A.Kendall. K.Cabbell  INTERVAL HISTORY:  Patient is seen, together with aunt, in continuing attention to progressive metastatic right breast cancer, for which she began weekly taxol on 05-09-16. The metastatic breast cancer progressed on xeloda and involves bones, lungs and possibly liver by PET 05-02-16. This MD discussed PET information with patient by phone 05-03-16. Last zometa was 04-01-16, held since then with increased creatinine. She had consultation with Dr Sondra Come on 05-08-16, radiation to symptomatic right iliac region planned 10-31 thru 05-30-16.   Patient had no problems with first taxol infusion itself, then felt generally weak days 3,4,5 after first low dose taxol. This weakness was not specifically in LE.[name] She had some cramping in legs and in abdomen, not clearly taxol aches. Bowels moved. She had slight nausea, did not take antiemetics. She tried to eat and to drink fluids. She has taken tramadol only once since chemo on 05-09-16, pain mostly low back to right hip. She is SOB with exertion, not moreso and was able to walk in office today. She has some seasonal allergies. She denies cough, chest pain, fever or symptoms of infection. No clear bladder symptoms. No HA or clear neurologic symptoms. Peripheral IV access apparently adequate for chemo (no nursing notes otherwise).  Remainder of 10 point Review of Systems negative.   Patient has refused flu vaccine thus far, tho we have discussed again today. No PAC Genetics 03-2016 (MyRisk by [facility], 28 gene panel) with one VUS otherwise negative including BRCA  ONCOLOGIC HISTORY The right breast carcinoma initially was T2 N1, ER/PR-positive in December 1998, treated with mastectomy with 12 node axillary evaluation and adjuvant Adriamycin/Cytoxan followed by 5 years of tamoxifen through December 2003. The breast cancer was found metastatic to pleura,  hormone positive in early 2010, treated with VATS sclerosis by Dr.Burney April 2010 and on Femara also since April 2010. CA 2729 was 434 in April 2010. She has tolerated Femara well and clinically has continued to do very well, though CA 27.29 has never normalized. 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 Patient seen by medical oncology 08-10-15 and radiation begun shortly afterwards by Dr Sondra Come, 22.5 of planned 35 Gy radiation to area of T10 thru 08-29-15. She had first zometa 08-11-15. MRI T spine obtained after some difficulty on 08-29-15, with pathologic compression fracture T10 with retropulsion of compressed vertebra to right with compression of cord called, involvement also T9, T11, L2. She had thoracic six- lumbar one posterolateral arthrodesis, T10 laminectomy, partial laminectomy T9, T11 by Dr Christella Noa on 09-01-15. She was begun on Aromasin on 09-07-15.  PET had been scheduled for 09-04-15, delayed with surgery, accomplished on 09-20-15 with chest adenopathy, increased soft tissue right lung base, extensive bone involvement.  Repeat PET 12-22-15 some improvement in area of radiation T spine, otherwise no improvement in bones, some increase right femur, some central chest adenopathy and right lower chest/ pleural involvement. Aromasin was discontinued and xeloda begun 01-05-16, used thru 04-29-16.  BRCA negative by [facility] 28 gene panel by [facility] 03-2016. Xeloda given 12-2015 thru 04-29-16, progressed in lung and bones by PET 05-02-16. Weekly taxol began 05-09-16.  Objective:  Vital signs in last 24 hours:  BP (!) 150/75 (BP Location: Left Arm, Patient Position: Sitting)   Pulse 90   Temp 98.3 F (36.8 C) (Oral)   Resp 18   Ht _0  (1.6  m)   Wt 164 lb 14.4 oz (74.8 kg)   SpO2 99%   BMI 29.21 kg/m  Weight down 3 lbs. Looks more pale than hgb value, not icteric, not cyanotic. Alert, oriented and appropriate, talkative, not always  very direct historian. Ambulatory with rolling walker, able to stand from chair without assistance.  No alopecia  HEENT:PERRL, sclerae not icteric. Oral mucosa moist without lesions, posterior pharynx clear.  Neck supple. No JVD.  Lymphatics:no supraclavicular adenopathy Resp: somewhat diminished BS thruout and including right lower chest, no wheezes or crackles. Dullness to percussion right lower chest posteriorly. Respirations not labored at rest or with limited activity in exam room. O2 sat 99% noted. Cardio: regular rate and rhythm. No gallop. GI: soft, nontender, not distended, no mass or organomegaly. Normally active bowel sounds. Surgical incision not remarkable. Musculoskeletal/ Extremities: LE/ UEwithout pitting edema, cords, tenderness.  Neuro: strength equal bilaterally. Speech fluent and appropriate. No CN deficits.  Skin without rash, ecchymosis, petechiae   Lab Results:  Results for orders placed or performed in visit on 05/16/16  CBC with Differential  Result Value Ref Range   WBC 1.8 (L) 3.9 - 10.3 10e3/uL   NEUT# 1.4 (L) 1.5 - 6.5 10e3/uL   HGB 12.1 11.6 - 15.9 g/dL   HCT 34.5 (L) 34.8 - 46.6 %   Platelets 199 145 - 400 10e3/uL   MCV 91.0 79.5 - 101.0 fL   MCH 31.9 25.1 - 34.0 pg   MCHC 35.1 31.5 - 36.0 g/dL   RBC 3.79 3.70 - 5.45 10e6/uL   RDW 14.4 11.2 - 14.5 %   lymph# 0.5 (L) 0.9 - 3.3 10e3/uL   MONO# 0.0 (L) 0.1 - 0.9 10e3/uL   Eosinophils Absolute 0.0 0.0 - 0.5 10e3/uL   Basophils Absolute 0.0 0.0 - 0.1 10e3/uL   NEUT% 74.8 38.4 - 76.8 %   LYMPH% 24.7 14.0 - 49.7 %   MONO% 0.5 0.0 - 14.0 %   EOS% 0.0 0.0 - 7.0 %   BASO% 0.0 0.0 - 2.0 %  Comprehensive metabolic panel  Result Value Ref Range   Sodium 137 136 - 145 mEq/L   Potassium 3.8 3.5 - 5.1 mEq/L   Chloride 109 98 - 109 mEq/L   CO2 18 (L) 22 - 29 mEq/L   Glucose 240 (H) 70 - 140 mg/dl   BUN 21.8 7.0 - 26.0 mg/dL   Creatinine 1.2 (H) 0.6 - 1.1 mg/dL   Total Bilirubin 0.99 0.20 - 1.20 mg/dL    Alkaline Phosphatase 121 40 - 150 U/L   AST 22 5 - 34 U/L   ALT 14 0 - 55 U/L   Total Protein 7.1 6.4 - 8.3 g/dL   Albumin 3.4 (L) 3.5 - 5.0 g/dL   Calcium 10.5 (H) 8.4 - 10.4 mg/dL   Anion Gap 10 3 - 11 mEq/L   EGFR 46 (L) >90 ml/min/1.73 m2    04-29-16 CA 2729   353 and CA 15-3  281  Urine culture 04-29-16 <10K CFU  Studies/Results: NUCLEAR MEDICINE PET SKULL BASE TO THIGH 05-02-16  COMPARISON:  PET-CT 12/22/2015.  FINDINGS: NECK  No hypermetabolic lymph nodes in the neck. However, there is some poorly defined infiltrative appearing soft tissue haziness in the in lower neck adjacent to the thyroid gland which is diffusely hypermetabolic (SUVmax = 6.3).  CHEST  Numerous pulmonary nodules are again noted throughout the lungs bilaterally, generally increased in size and number compared to the prior examination, with the largest of  these nodules in the right lung associated with the right major fissure (image 31 of series 7) measuring 13 x 16 mm (SUVmax = 29.1). Large partially calcified pleural-based mass in the inferior aspect of the right lower lobe appear is similar to the prior examination, and is again hypermetabolic (SUVmax = 97.6). Several hypermetabolic bilateral hilar and mediastinal lymph nodes are noted, with the most hypermetabolism in a subcarinal lymph node (SUVmax = 8.1), which appears borderline enlarged measuring approximately 8 mm. Mild cardiomegaly. There is aortic atherosclerosis, as well as atherosclerosis of the great vessels of the mediastinum and the coronary arteries, including calcified atherosclerotic plaque in the left anterior descending and right coronary arteries. Calcifications of the aortic valve. Small left pleural effusion. Mild diffuse interlobular septal thickening in the lungs, favored to reflect a background of very mild interstitial pulmonary edema.  ABDOMEN/PELVIS  There are some patchy areas of heterogeneous increased  metabolic activity in the liver, most of which have no discrete CT correlate. However, there are some tiny capsular areas overlying the right lobe of the liver that are partially calcified (image 93 of series 4) which correspond to hypermetabolic lesions (SUVmax = 9.6)on the PET portion of the examination, concerning for hepatic metastasis. No abnormal hypermetabolic activity within the pancreas, adrenal glands, or spleen. In the distal right common iliac nodal chain there is an enlarged lymph node measuring 12 mm in short axis (image 134 of series 4) which is hypermetabolic (SUVmax = 6.9), similar to the prior examination. Low-attenuation lesion in the lower pole of the left kidney is incompletely characterized, but presumably a parapelvic cysts, similar to the prior study. No significant volume of ascites. No pneumoperitoneum. No pathologic dilatation of small bowel or colon. Status post hysterectomy. Ovaries are not confidently identified may be surgically absent or atrophic.  SKELETON  Innumerable hypermetabolic lesions are noted throughout the visualized axial and appendicular skeleton. Many of these are old lesions seen on prior examinations, however, these generally appear increased in size and number. Specific examples include new lesions in the right skull base just lateral to the foramen magnum (SUVmax = 10.8), a much larger hypermetabolic lesion (SUVmax = 20.0)which is mixed lytic and sclerotic in the right scapular glenoid(image 44 of series 4), and persistent increase in areas of irregular lucency and sclerosis throughout the sacrum which are diffusely hypermetabolic (SUVmax = 73.4). Multiple other osseous lesions are also noted. Orthopedic fixation hardware in the lower thoracic and upper lumbar spine again noted. Surgical clips in the right axillary region from prior lymph node dissection. Status post right modified radical mastectomy.  IMPRESSION: 1. Today's  study demonstrates general progression of disease, as evidenced by increased number and size of numerous osseous lesions and pulmonary lesions, as discussed above. 2. Additional incidental findings, as above.  PACs images reviewed by MD  Medications: I have reviewed the patient's current medications. I have encouraged her to use antiemetics and pain medications to improve her symptoms.   DISCUSSION Patient refuses flu vaccine due to illness after flu vaccine 30 years ago. She is not allergic to eggs. I have explained that the flu vaccine is not a live virus, so cannot cause flu. I have told her that she is at high risk for catching flu and for complications of flu due to immunocompromised state and necessary medical visits. She will consider this information. I would prefer not to give on day of chemo.   Patient is aware of progression documented on PET. She is willing to continue  taxol, dose today decreased due to [place] 1.4 and will add granix 10-27 and day after treatments. I will see her back next week prior to chemo, which should be ok to give with radiation to right iliac region as long as counts ok and patient otherwise stable.  Unfortunately she is likely to rapidly worsen if we cannot improve situation with the metastatic disease.  Assessment/Plan:  1.progressive metastatic breast cancer lungs and extensive bone involvement by PET 05-02-16: treatment changed to weekly taxol beginning 05-09-16. ANC 1.4 today, taxol dose decreased to 60 mg/m2 and will add granix 300 mcg 05-17-16.  For radiation to right iliac area 10-31 thru 05-30-16. WIll decide on 11-2 if able to give taxol that day. Prior to present taxol, no significant improvement on Aromasin x 4 months thru  01-02-16. Oral xeloda  12-2015 thru 04-29-16, progressed.  Last zometa 04-01-16, held with increased creatinine for now. .Pathologic compression fracture T10 and retropulsion of bone impinging on spinal cord for which she had T6 - L1  posterolateral arthrodesis with T10 laminectomy and T9/ T11 partial laminectomy by Dr Christella Noa on 2-10-1. Previous radiation to right hip and right femur helpful. 2,Several recent UTIs, culture negative 04-29-16 3 minimal elevation in.calcemia todaywith albumin now normal. Will follow and resume zometa when appropriate 4.Cozaar held since 6-14-17due to potential interaction with xeloda. Could resume if needed now 5.diarrhea Jan treated for C diff ( Ab + toxin -) and resolved. 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  8.Advance Directives in place 9. Should have flu vaccine if she agrees   All questions answered and patient is in agreement with plan of care, encouraged to call if needed prior to next scheduled appointments. Chemo orders adjusted, granix orders placed. Time spent 25 min including >50% counseling and coordination of care.    Evlyn Clines, MD   05/16/2016, 5:22 PM

## 2016-05-16 NOTE — Progress Notes (Signed)
OK to treat with today's CBC. ANC 1.4, WBC 1.8. Per Dr Marko Plume

## 2016-05-16 NOTE — Patient Instructions (Signed)
[facility] Discharge Instructions for Patients Receiving Chemotherapy  Today you received the following chemotherapy agents:  Taxol  To help prevent nausea and vomiting after your treatment, we encourage you to take your nausea medication as prescribed.   If you develop nausea and vomiting that is not controlled by your nausea medication, call the clinic.   BELOW ARE SYMPTOMS THAT SHOULD BE REPORTED IMMEDIATELY:  *FEVER GREATER THAN 100.5 F  *CHILLS WITH OR WITHOUT FEVER  NAUSEA AND VOMITING THAT IS NOT CONTROLLED WITH YOUR NAUSEA MEDICATION  *UNUSUAL SHORTNESS OF BREATH  *UNUSUAL BRUISING OR BLEEDING  TENDERNESS IN MOUTH AND THROAT WITH OR WITHOUT PRESENCE OF ULCERS  *URINARY PROBLEMS  *BOWEL PROBLEMS  UNUSUAL RASH Items with * indicate a potential emergency and should be followed up as soon as possible.  Feel free to call the clinic you have any questions or concerns. The clinic phone number is [phone].  Please show the CHEMO ALERT CARD at check-in to the Emergency Department and triage nurse.   

## 2016-05-17 ENCOUNTER — Ambulatory Visit (HOSPITAL_BASED_OUTPATIENT_CLINIC_OR_DEPARTMENT_OTHER): Payer: Medicare Other

## 2016-05-17 VITALS — BP 138/85 | HR 93 | Temp 98.3°F | Resp 20

## 2016-05-17 DIAGNOSIS — C50911 Malignant neoplasm of unspecified site of right female breast: Secondary | ICD-10-CM

## 2016-05-17 DIAGNOSIS — C50919 Malignant neoplasm of unspecified site of unspecified female breast: Secondary | ICD-10-CM | POA: Diagnosis not present

## 2016-05-17 DIAGNOSIS — Z5189 Encounter for other specified aftercare: Secondary | ICD-10-CM

## 2016-05-17 DIAGNOSIS — C7951 Secondary malignant neoplasm of bone: Secondary | ICD-10-CM | POA: Diagnosis not present

## 2016-05-17 MED ORDER — TBO-FILGRASTIM 300 MCG/0.5ML ~~LOC~~ SOSY
300.0000 ug | PREFILLED_SYRINGE | Freq: Once | SUBCUTANEOUS | Status: AC
  Administered 2016-05-17: 300 ug via SUBCUTANEOUS
  Filled 2016-05-17: qty 0.5

## 2016-05-17 NOTE — Patient Instructions (Signed)

## 2016-05-18 DIAGNOSIS — C7800 Secondary malignant neoplasm of unspecified lung: Secondary | ICD-10-CM

## 2016-05-18 DIAGNOSIS — C50911 Malignant neoplasm of unspecified site of right female breast: Secondary | ICD-10-CM | POA: Insufficient documentation

## 2016-05-19 DIAGNOSIS — Z1379 Encounter for other screening for genetic and chromosomal anomalies: Secondary | ICD-10-CM | POA: Insufficient documentation

## 2016-05-19 NOTE — Progress Notes (Signed)
GENETIC TEST RESULT  HPI: Lisa [name] was previously seen in the [facility] clinic on April 11, 2016 due to a personal history of metastatic breast cancer, family history of breast and prostate cancer, and concerns regarding a hereditary predisposition to cancer. Please refer to our prior cancer genetics clinic note from 04/11/2016 for more information regarding Lisa [name] medical, social and family histories, and our assessment and recommendations, at the time. Lisa [name] recent genetic test results were disclosed to her, as were recommendations warranted by these results. These results and recommendations are discussed in more detail below.  GENETIC TEST RESULTS: At the time of Lisa [name] visit on 04/11/16, we recommended she pursue genetic testing of the 28-gene myRisk Hereditary Cancer Panel through [facility].  The [facility] Hereditary Cancer Panel offered by [facility] ([place], [place]) includes sequencing and/or deletion/duplication testing of the following 28 genes: APC, ATM, BARD1, BMPR1A, BRCA1, BRCA2, BRIP1, CHD1, CDK4, CDKN2A, CHEK2, EPCAM, GREM1/SCG5, MLH1, MSH2, MSH6, MUTYH, NBN, PALB2, PMS2, POLD1, POLE, PTEN, RAD51C, RAD51D, SMAD4, STK11, and TP53. Those results are now back, the report date for which is April 19, 2016.  Genetic testing was normal, and did not reveal a know pathogenic mutation in these genes.  One variant of uncertain significance (VUS) was found in the APC gene.  The test report will be scanned into EPIC and will be located under the Results Review tab in the Pathology>Molecular Pathology section.   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 At this time, it is unknown if this VUS is associated with an increased risk for cancer or if this is a normal finding. Since this VUS result is uncertain, it cannot help guide  screening recommendations, and family members should not be tested for this VUS to help define their own cancer risks.  Also, we all have variants within our genes that make [place] unique individuals--most of these variants are benign.  Thus, we treat this VUS as a negative result, until it gets updated by the lab.   With time, we suspect the lab will reclassify this variant and when they do, we will try to re-contact Lisa [name] to discuss the reclassification further.  We also encouraged Lisa [name] to contact us in a year or two to obtain an update on the status of this VUS.  We discussed with Lisa [name] that since the current genetic testing is not perfect, it is possible there may be a gene mutation in one of these genes that current testing cannot detect, but that chance is small. We also discussed, that it is possible that another gene that has not yet been discovered, or that we have not yet tested, is responsible for the cancer diagnoses in the family, and it is, therefore, important to remain in touch with cancer genetics in the future so that we can continue to offer Lisa [name] the most up-to-date genetic testing.   CANCER SCREENING RECOMMENDATIONS: While we still do not have an explanation for the personal and family history of cancer, this result may be reassuring and indicate that Lisa [name] likely does not have an increased risk for a future cancer due to a mutation in one of these genes. This normal test also suggests that Lisa [name] cancer was most likely not due to an inherited predisposition associated with one of these genes.  Most cancers happen by chance and this negative  test suggests that her cancer falls into this category.  Further testing of other affected relatives may be helpful to further elucidation of the personal and familial cancer risks.  In the meantime, we recommended she continue to follow the cancer management and screening guidelines provided by her oncology and primary  healthcare providers.   RECOMMENDATIONS FOR FAMILY MEMBERS: Women in this family might be at some increased risk of developing cancer, over the general population risk, simply due to the family history of cancer. We recommended women in this family have a yearly mammogram beginning at age 28, or 71 years younger than the earliest onset of cancer, an annual clinical breast exam, and perform monthly breast self-exams. Women in this family should also have a gynecological exam as recommended by their primary provider. All family members should have a colonoscopy by age 32.  Based on Lisa [name] family history, we recommended her maternal relatives, who are still living and who have a history of breast or prostate cancer, have genetic counseling and testing. Lisa [name] will let us know if we can be of any assistance in coordinating genetic counseling and/or testing for these family members.  Family members who live in other cities can use the website of the [employer] ([employer]) to find a Oncologist by zip code.     FOLLOW-UP: Lastly, we discussed with Lisa [name] that cancer genetics is a rapidly advancing field and it is possible that new genetic tests will be appropriate for her and/or her family members in the future. We encouraged her to remain in contact with cancer genetics on an annual basis so we can update her personal and family histories and let her know of advances in cancer genetics that may benefit this family.   Our contact number was provided. Lisa [name] questions were answered to her satisfaction, and she knows she is welcome to call us at anytime with additional questions or concerns.   Jeanine Luz, MS, [facility] Certified Genetic Counselor [email or web address] Phone: [phone]

## 2016-05-21 ENCOUNTER — Ambulatory Visit
Admission: RE | Admit: 2016-05-21 | Discharge: 2016-05-21 | Disposition: A | Discharge status: Home / Self Care | Payer: Medicare Other | Source: Ambulatory Visit | Attending: Radiation Oncology | Admitting: Radiation Oncology

## 2016-05-21 ENCOUNTER — Encounter: Payer: Self-pay | Admitting: Radiation Oncology

## 2016-05-21 VITALS — BP 137/68 | HR 57 | Temp 98.2°F | Ht 63.0 in | Wt 162.0 lb

## 2016-05-21 DIAGNOSIS — C50919 Malignant neoplasm of unspecified site of unspecified female breast: Secondary | ICD-10-CM

## 2016-05-21 DIAGNOSIS — C7951 Secondary malignant neoplasm of bone: Principal | ICD-10-CM

## 2016-05-21 NOTE — Progress Notes (Signed)
  Radiation Oncology         [phone] ________________________________  Name: Lisa [name] MRN: [id number]  Date: 05/21/2016  DOB: 1940/09/11  Weekly Radiation Therapy Management    ICD-9-CM ICD-10-CM   1. Carcinoma of breast metastatic to bone, unspecified laterality (HCC) 174.9 C50.919    198.5 C79.51      Current Dose: 3.5 Gy     Planned Dose:  28 Gy  Narrative . . . . . . . . The patient presents for routine under treatment assessment.                                   The patient  Continues to have pain in the right pelvis region. No chest pain or shortness of breath.                                 Set-up films were reviewed.                                 The chart was checked. Physical Findings. . .  height is 5\' 3"  (1.6 m) and weight is 162 lb (73.5 kg). Her oral temperature is 98.2 F (36.8 C). Her blood pressure is 137/68 and her pulse is 57 (abnormal). Her oxygen saturation is 99%. . Weight essentially stable.  No significant changes.  Lungs are clear. Heart rate appears to be somewhat irregular. Peripheral pulses are strong but somewhat irregular Impression . . . . . . . The patient is tolerating radiation. Plan . . . . . . . . . . . . Continue treatment as planned. Patient will come in tomorrow prior to her treatment for further evaluation of her heart rate and pulse. She denies any prior history of atrial fibrillation.  ________________________________   Blair Promise, PhD, MD

## 2016-05-21 NOTE — Progress Notes (Addendum)
Ms Lions has received treatment to her right ilium.  Denies any pain at this time and states if she is not seated properly when coughing then she has increase lower back and hip pain. Pulse 144 when checked with irregularity, but now is 57% with O2 sat of 99%.  Not SOB when ambulating. 1st BP 137/68. Pulse 144  BP 137/68 (BP Location: Right Arm, Patient Position: Sitting, Cuff Size: Normal)   Pulse (!) 57   Temp 98.2 F (36.8 C) (Oral)   Ht 5\' 3"  (1.6 m)   Wt 162 lb (73.5 kg)   SpO2 99%   BMI 28.70 kg/m

## 2016-05-21 NOTE — Progress Notes (Signed)
  Radiation Oncology         [phone]) [phone] ________________________________  Name: Lisa [name] MRN: [id number]  Date: 05/21/2016  DOB: 05/30/1941  Simulation Verification Note    ICD-9-CM ICD-10-CM   1. Carcinoma of breast metastatic to bone, unspecified laterality (HCC) 174.9 C50.919    198.5 C79.51     Status: outpatient  NARRATIVE: The patient was brought to the treatment unit and placed in the planned treatment position. The clinical setup was verified. Then port films were obtained and uploaded to the radiation oncology medical record software.  The treatment beams were carefully compared against the planned radiation fields. The position location and shape of the radiation fields was reviewed. They targeted volume of tissue appears to be appropriately covered by the radiation beams. Organs at risk appear to be excluded as planned.  Based on my personal review, I approved the simulation verification. The patient's treatment will proceed as planned.  -----------------------------------  Blair Promise, PhD, MD

## 2016-05-22 ENCOUNTER — Emergency Department (HOSPITAL_COMMUNITY): Payer: Medicare Other

## 2016-05-22 ENCOUNTER — Ambulatory Visit
Admission: RE | Admit: 2016-05-22 | Discharge: 2016-05-22 | Disposition: A | Discharge status: Home / Self Care | Payer: Medicare Other | Source: Ambulatory Visit | Attending: Radiation Oncology | Admitting: Radiation Oncology

## 2016-05-22 ENCOUNTER — Encounter (HOSPITAL_COMMUNITY): Payer: Self-pay | Admitting: Emergency Medicine

## 2016-05-22 ENCOUNTER — Other Ambulatory Visit: Payer: Self-pay | Admitting: Oncology

## 2016-05-22 ENCOUNTER — Inpatient Hospital Stay (HOSPITAL_COMMUNITY)
Admission: EM | Admit: 2016-05-22 | Discharge: 2016-05-31 | DRG: 308 | Disposition: A | Discharge status: Home / Self Care | Payer: Medicare Other | Attending: Internal Medicine | Admitting: Internal Medicine

## 2016-05-22 ENCOUNTER — Other Ambulatory Visit: Payer: Self-pay

## 2016-05-22 VITALS — BP 140/61 | HR 78

## 2016-05-22 DIAGNOSIS — I481 Persistent atrial fibrillation: Principal | ICD-10-CM | POA: Diagnosis present

## 2016-05-22 DIAGNOSIS — C787 Secondary malignant neoplasm of liver and intrahepatic bile duct: Secondary | ICD-10-CM | POA: Diagnosis present

## 2016-05-22 DIAGNOSIS — C50911 Malignant neoplasm of unspecified site of right female breast: Secondary | ICD-10-CM | POA: Diagnosis not present

## 2016-05-22 DIAGNOSIS — E860 Dehydration: Secondary | ICD-10-CM | POA: Diagnosis present

## 2016-05-22 DIAGNOSIS — E785 Hyperlipidemia, unspecified: Secondary | ICD-10-CM | POA: Diagnosis present

## 2016-05-22 DIAGNOSIS — N183 Chronic kidney disease, stage 3 (moderate): Secondary | ICD-10-CM | POA: Diagnosis present

## 2016-05-22 DIAGNOSIS — I10 Essential (primary) hypertension: Secondary | ICD-10-CM | POA: Diagnosis present

## 2016-05-22 DIAGNOSIS — I499 Cardiac arrhythmia, unspecified: Secondary | ICD-10-CM | POA: Diagnosis present

## 2016-05-22 DIAGNOSIS — I130 Hypertensive heart and chronic kidney disease with heart failure and stage 1 through stage 4 chronic kidney disease, or unspecified chronic kidney disease: Secondary | ICD-10-CM | POA: Diagnosis present

## 2016-05-22 DIAGNOSIS — R0602 Shortness of breath: Secondary | ICD-10-CM

## 2016-05-22 DIAGNOSIS — I313 Pericardial effusion (noninflammatory): Secondary | ICD-10-CM | POA: Diagnosis present

## 2016-05-22 DIAGNOSIS — I5033 Acute on chronic diastolic (congestive) heart failure: Secondary | ICD-10-CM | POA: Diagnosis not present

## 2016-05-22 DIAGNOSIS — Z87891 Personal history of nicotine dependence: Secondary | ICD-10-CM

## 2016-05-22 DIAGNOSIS — C7951 Secondary malignant neoplasm of bone: Secondary | ICD-10-CM | POA: Diagnosis present

## 2016-05-22 DIAGNOSIS — I700 Atherosclerosis of aorta: Secondary | ICD-10-CM | POA: Diagnosis not present

## 2016-05-22 DIAGNOSIS — N179 Acute kidney failure, unspecified: Secondary | ICD-10-CM | POA: Diagnosis present

## 2016-05-22 DIAGNOSIS — T501X5A Adverse effect of loop [high-ceiling] diuretics, initial encounter: Secondary | ICD-10-CM | POA: Diagnosis not present

## 2016-05-22 DIAGNOSIS — I5031 Acute diastolic (congestive) heart failure: Secondary | ICD-10-CM

## 2016-05-22 DIAGNOSIS — R000 Tachycardia, unspecified: Secondary | ICD-10-CM | POA: Diagnosis not present

## 2016-05-22 DIAGNOSIS — E876 Hypokalemia: Secondary | ICD-10-CM | POA: Diagnosis present

## 2016-05-22 DIAGNOSIS — N189 Chronic kidney disease, unspecified: Secondary | ICD-10-CM | POA: Diagnosis present

## 2016-05-22 DIAGNOSIS — C7800 Secondary malignant neoplasm of unspecified lung: Secondary | ICD-10-CM | POA: Diagnosis not present

## 2016-05-22 DIAGNOSIS — I351 Nonrheumatic aortic (valve) insufficiency: Secondary | ICD-10-CM | POA: Diagnosis not present

## 2016-05-22 DIAGNOSIS — R05 Cough: Secondary | ICD-10-CM

## 2016-05-22 DIAGNOSIS — C7802 Secondary malignant neoplasm of left lung: Secondary | ICD-10-CM | POA: Diagnosis present

## 2016-05-22 DIAGNOSIS — I4891 Unspecified atrial fibrillation: Secondary | ICD-10-CM | POA: Diagnosis not present

## 2016-05-22 DIAGNOSIS — C50919 Malignant neoplasm of unspecified site of unspecified female breast: Secondary | ICD-10-CM | POA: Diagnosis present

## 2016-05-22 DIAGNOSIS — Z90710 Acquired absence of both cervix and uterus: Secondary | ICD-10-CM | POA: Diagnosis not present

## 2016-05-22 DIAGNOSIS — Z79899 Other long term (current) drug therapy: Secondary | ICD-10-CM

## 2016-05-22 DIAGNOSIS — Z66 Do not resuscitate: Secondary | ICD-10-CM | POA: Diagnosis present

## 2016-05-22 DIAGNOSIS — R599 Enlarged lymph nodes, unspecified: Secondary | ICD-10-CM | POA: Diagnosis not present

## 2016-05-22 DIAGNOSIS — C7801 Secondary malignant neoplasm of right lung: Secondary | ICD-10-CM | POA: Diagnosis present

## 2016-05-22 DIAGNOSIS — R5381 Other malaise: Secondary | ICD-10-CM | POA: Diagnosis present

## 2016-05-22 DIAGNOSIS — Z923 Personal history of irradiation: Secondary | ICD-10-CM | POA: Diagnosis not present

## 2016-05-22 DIAGNOSIS — I517 Cardiomegaly: Secondary | ICD-10-CM | POA: Diagnosis not present

## 2016-05-22 DIAGNOSIS — R058 Other specified cough: Secondary | ICD-10-CM

## 2016-05-22 DIAGNOSIS — J90 Pleural effusion, not elsewhere classified: Secondary | ICD-10-CM | POA: Diagnosis not present

## 2016-05-22 DIAGNOSIS — I480 Paroxysmal atrial fibrillation: Secondary | ICD-10-CM

## 2016-05-22 DIAGNOSIS — Z981 Arthrodesis status: Secondary | ICD-10-CM | POA: Diagnosis not present

## 2016-05-22 DIAGNOSIS — I3139 Other pericardial effusion (noninflammatory): Secondary | ICD-10-CM

## 2016-05-22 DIAGNOSIS — Z9011 Acquired absence of right breast and nipple: Secondary | ICD-10-CM | POA: Diagnosis not present

## 2016-05-22 HISTORY — DX: Other persistent atrial fibrillation: I48.19

## 2016-05-22 HISTORY — DX: Atherosclerotic heart disease of native coronary artery without angina pectoris: I25.10

## 2016-05-22 HISTORY — DX: Other pericardial effusion (noninflammatory): I31.39

## 2016-05-22 HISTORY — DX: Pericardial effusion (noninflammatory): I31.3

## 2016-05-22 HISTORY — DX: Coronary atherosclerosis due to calcified coronary lesion: I25.84

## 2016-05-22 HISTORY — DX: Chronic kidney disease, stage 3 unspecified: N18.30

## 2016-05-22 HISTORY — DX: Chronic kidney disease, stage 3 (moderate): N18.3

## 2016-05-22 LAB — URINALYSIS, ROUTINE W REFLEX MICROSCOPIC
Bilirubin Urine: NEGATIVE
Glucose, UA: NEGATIVE mg/dL
HGB URINE DIPSTICK: NEGATIVE
Ketones, ur: NEGATIVE mg/dL
LEUKOCYTES UA: NEGATIVE
NITRITE: NEGATIVE
PROTEIN: NEGATIVE mg/dL
SPECIFIC GRAVITY, URINE: 1.007 (ref 1.005–1.030)
pH: 7 (ref 5.0–8.0)

## 2016-05-22 LAB — CBC WITH DIFFERENTIAL/PLATELET
BASOS PCT: 1 %
Basophils Absolute: 0 10*3/uL (ref 0.0–0.1)
EOS ABS: 0.1 10*3/uL (ref 0.0–0.7)
EOS PCT: 4 %
HEMATOCRIT: 33.8 % — AB (ref 36.0–46.0)
HEMOGLOBIN: 11.6 g/dL — AB (ref 12.0–15.0)
LYMPHS PCT: 38 %
Lymphs Abs: 0.8 10*3/uL (ref 0.7–4.0)
MCH: 31.4 pg (ref 26.0–34.0)
MCHC: 34.3 g/dL (ref 30.0–36.0)
MCV: 91.4 fL (ref 78.0–100.0)
MONOS PCT: 15 %
Monocytes Absolute: 0.3 10*3/uL (ref 0.1–1.0)
NEUTROS ABS: 1 10*3/uL — AB (ref 1.7–7.7)
NEUTROS PCT: 42 %
Platelets: 251 10*3/uL (ref 150–400)
RBC: 3.7 MIL/uL — ABNORMAL LOW (ref 3.87–5.11)
RDW: 14.4 % (ref 11.5–15.5)
WBC: 2.2 10*3/uL — ABNORMAL LOW (ref 4.0–10.5)

## 2016-05-22 LAB — BASIC METABOLIC PANEL
Anion gap: 6 (ref 5–15)
BUN: 24 mg/dL — AB (ref 6–20)
CHLORIDE: 107 mmol/L (ref 101–111)
CO2: 25 mmol/L (ref 22–32)
Calcium: 9.9 mg/dL (ref 8.9–10.3)
Creatinine, Ser: 1.37 mg/dL — ABNORMAL HIGH (ref 0.44–1.00)
GFR calc Af Amer: 43 mL/min — ABNORMAL LOW (ref >60)
GFR calc non Af Amer: 38 mL/min — ABNORMAL LOW (ref >60)
Glucose, Bld: 103 mg/dL — ABNORMAL HIGH (ref 65–99)
POTASSIUM: 3.1 mmol/L — AB (ref 3.5–5.1)
SODIUM: 138 mmol/L (ref 135–145)

## 2016-05-22 LAB — TSH: TSH: 1.856 u[IU]/mL (ref 0.350–4.500)

## 2016-05-22 LAB — TROPONIN I

## 2016-05-22 LAB — MAGNESIUM: Magnesium: 2 mg/dL (ref 1.7–2.4)

## 2016-05-22 MED ORDER — SODIUM CHLORIDE 0.9 % IV SOLN
INTRAVENOUS | Status: DC
  Administered 2016-05-22: 23:00:00 via INTRAVENOUS

## 2016-05-22 MED ORDER — DILTIAZEM HCL 100 MG IV SOLR
5.0000 mg/h | Freq: Once | INTRAVENOUS | Status: AC
  Administered 2016-05-22: 10 mg/h via INTRAVENOUS
  Filled 2016-05-22: qty 100

## 2016-05-22 MED ORDER — VITAMIN E 45 MG (100 UNIT) PO CAPS
200.0000 [IU] | ORAL_CAPSULE | Freq: Every day | ORAL | Status: DC
  Administered 2016-05-23 – 2016-05-31 (×8): 200 [IU] via ORAL
  Filled 2016-05-22 (×4): qty 2
  Filled 2016-05-22: qty 1
  Filled 2016-05-22 (×5): qty 2

## 2016-05-22 MED ORDER — POTASSIUM CHLORIDE CRYS ER 20 MEQ PO TBCR
40.0000 meq | EXTENDED_RELEASE_TABLET | Freq: Once | ORAL | Status: AC
  Administered 2016-05-22: 40 meq via ORAL
  Filled 2016-05-22: qty 2

## 2016-05-22 MED ORDER — METOPROLOL TARTRATE 25 MG PO TABS
25.0000 mg | ORAL_TABLET | Freq: Three times a day (TID) | ORAL | Status: DC
  Administered 2016-05-22 – 2016-05-23 (×2): 25 mg via ORAL
  Filled 2016-05-22 (×2): qty 1

## 2016-05-22 MED ORDER — SIMVASTATIN 20 MG PO TABS
20.0000 mg | ORAL_TABLET | Freq: Every day | ORAL | Status: DC
  Administered 2016-05-23 – 2016-05-30 (×8): 20 mg via ORAL
  Filled 2016-05-22 (×8): qty 1

## 2016-05-22 MED ORDER — ACETAMINOPHEN 325 MG PO TABS: 325.0000 mg | ORAL_TABLET | Freq: Four times a day (QID) | ORAL | Status: DC | PRN

## 2016-05-22 MED ORDER — ALPRAZOLAM 0.25 MG PO TABS
0.2500 mg | ORAL_TABLET | Freq: Two times a day (BID) | ORAL | Status: DC | PRN
  Filled 2016-05-22: qty 1

## 2016-05-22 MED ORDER — METOPROLOL TARTRATE 5 MG/5ML IV SOLN
5.0000 mg | Freq: Once | INTRAVENOUS | Status: AC
  Administered 2016-05-22: 5 mg via INTRAVENOUS
  Filled 2016-05-22: qty 5

## 2016-05-22 MED ORDER — ONDANSETRON HCL 4 MG PO TABS: 8.0000 mg | ORAL_TABLET | Freq: Three times a day (TID) | ORAL | Status: DC | PRN

## 2016-05-22 MED ORDER — LORATADINE 10 MG PO TABS
10.0000 mg | ORAL_TABLET | Freq: Every day | ORAL | Status: DC
  Administered 2016-05-23 – 2016-05-31 (×8): 10 mg via ORAL
  Filled 2016-05-22 (×8): qty 1

## 2016-05-22 MED ORDER — VITAMIN C 500 MG PO TABS
500.0000 mg | ORAL_TABLET | Freq: Every day | ORAL | Status: DC
  Administered 2016-05-23 – 2016-05-31 (×8): 500 mg via ORAL
  Filled 2016-05-22 (×9): qty 1

## 2016-05-22 MED ORDER — ASPIRIN 325 MG PO TABS: 325.0000 mg | ORAL_TABLET | Freq: Every day | ORAL | Status: DC

## 2016-05-22 MED ORDER — DILTIAZEM HCL 25 MG/5ML IV SOLN
10.0000 mg | Freq: Once | INTRAVENOUS | Status: AC
  Administered 2016-05-22: 10 mg via INTRAVENOUS
  Filled 2016-05-22: qty 5

## 2016-05-22 MED ORDER — ZOLPIDEM TARTRATE 5 MG PO TABS: 5.0000 mg | ORAL_TABLET | Freq: Every evening | ORAL | Status: DC | PRN

## 2016-05-22 MED ORDER — VITAMIN B-12 1000 MCG PO TABS
1000.0000 ug | ORAL_TABLET | Freq: Two times a day (BID) | ORAL | Status: DC
  Administered 2016-05-23 – 2016-05-31 (×15): 1000 ug via ORAL
  Filled 2016-05-22 (×15): qty 1

## 2016-05-22 MED ORDER — ASPIRIN EC 81 MG PO TBEC: 81.0000 mg | DELAYED_RELEASE_TABLET | Freq: Every day | ORAL | Status: DC

## 2016-05-22 MED ORDER — HEPARIN SODIUM (PORCINE) 5000 UNIT/ML IJ SOLN: 5000.0000 [IU] | Freq: Three times a day (TID) | INTRAMUSCULAR | Status: DC

## 2016-05-22 MED ORDER — DILTIAZEM HCL 100 MG IV SOLR
5.0000 mg/h | Freq: Once | INTRAVENOUS | Status: AC
  Administered 2016-05-23: 15 mg/h via INTRAVENOUS
  Filled 2016-05-22: qty 100

## 2016-05-22 MED ORDER — TRAMADOL HCL 50 MG PO TABS
50.0000 mg | ORAL_TABLET | Freq: Three times a day (TID) | ORAL | Status: DC | PRN
  Filled 2016-05-22: qty 2

## 2016-05-22 MED ORDER — APIXABAN 5 MG PO TABS
5.0000 mg | ORAL_TABLET | Freq: Two times a day (BID) | ORAL | Status: DC
  Administered 2016-05-23 – 2016-05-31 (×18): 5 mg via ORAL
  Filled 2016-05-22 (×18): qty 1

## 2016-05-22 NOTE — ED Notes (Signed)
Bed: WA03 Expected date:  Expected time:  Means of arrival:  Comments: 75 yo from cancer center

## 2016-05-22 NOTE — ED Triage Notes (Signed)
Patient brought over from cancer center for new onset of afib. Patient states that she had breast cancer and is currently getting treatment for bone cancer.

## 2016-05-22 NOTE — H&P (Signed)
History and Physical    Lisa [name] [id number] DOB: 1940-10-13 DOA: 05/22/2016  Referring MD/NP/PA:   PCP: [name], MD   Patient coming from:  The patient is coming from home.  At baseline, pt is independent for most of ADL.   Chief Complaint: Irregular heartbeat and chest discomfort  HPI: Lisa [name] is a 75 y.o. 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 difficile colitis, CKD-2, who presents with irregular heartbeat.  Pt states that she was in [place] for radiation oncology clinic today. She had mild chest discomfort and found to have irregularly irregular heartbeat by Dr. Sondra Come, therefore she was sent to ED for further evaluation and treatment. She was found to have new onset A fib with RVR in ED. She states that her symptoms may have started 2 weeks ago. Currently patient denies any chest pain. She has mild exertional shortness of breath and mild cough, but no fever or chills. No tenderness over calf areas. No hx of A fib in the past. Patient does not have nausea, vomiting, abdominal pain, diarrhea, symptoms of UTI or unilateral weakness.  ED Course: pt was found to have A fib with RVR with HR up to 140, WBC 2.2, TSH 1.856, potassium 3.1, worsening renal function, temperature normal, oxygen saturation normal. Pt is admitted to SDU as inpt. Card, Dr. Sallyanne Kuster was consulted.  Review of Systems:   General: no fevers, chills, no changes in body weight, has fatigue HEENT: no blurry vision, hearing changes or sore throat Respiratory: no dyspnea, coughing, wheezing CV: had chest discomfort, no chest pain, no palpitations GI: no nausea, vomiting, abdominal pain, diarrhea,  constipation GU: no dysuria, burning on urination, increased urinary frequency, hematuria  Ext: no leg edema Neuro: no unilateral weakness, numbness, or tingling, no vision change or hearing loss Skin: no rash, no skin tear. MSK: No muscle spasm, no deformity, no limitation of range of movement in spin Heme: No easy bruising.  Travel history: No recent long distant travel.  Allergy:  Allergies  Allergen Reactions  . Diphenhydramine Hcl Other (See Comments)    Severe headaches  . Codeine Sulfate Rash    REACTION: rash. Makes her jittery  . Oseltamivir Phosphate Nausea And Vomiting    REACTION: rash  . Oxycodone-Acetaminophen Nausea And Vomiting    REACTION: emesis  . Prednisone Other (See Comments)    REACTION: hiatal hernia, runny  Nose, felt terrible  . Sudafed [Pseudoephedrine Hcl] Other (See Comments)    vertigo    Past Medical History:  Diagnosis Date  . Breast cancer ([place]) 07-13-1997   right  . Chronic back pain   . Complication of anesthesia   . Hypertension   . Pleural effusion 11-08-2008  . PONV (postoperative nausea and vomiting)   . Radiation 08/17/2015-08/29/2015   lower thoracic spine 22.5 gray  . Radiation 01/17/16-01/24/16   right femur 20Gy    Past Surgical History:  Procedure Laterality Date  . ABDOMINAL HYSTERECTOMY  02-21-1982  . BREAST BIOPSY  07-13-1997  . DILATION AND CURETTAGE, DIAGNOSTIC / THERAPEUTIC  12-13-1981  . MASTECTOMY  07-29-1997  . PELVIC LAPAROSCOPY  11-06-1981  . POSTERIOR LUMBAR FUSION 4 LEVEL N/A 09/01/2015   Procedure: Thoracic six-Lumbar one fusion with arthrodesis pedicle screw stabilization and decompression;  Surgeon:  Ashok Pall, MD;  Location: [facility];  Service: Neurosurgery;  Laterality: N/A;  T6-L1 fusion with arthrodesis pedicle screw stabilization and decompression  . THORACENTESIS  10-18-2008    Social History:  reports that she quit smoking about 19 years ago. Her smoking use included Cigarettes. She has a 15.00 pack-year  smoking history. She has never used smokeless tobacco. She reports that she does not drink alcohol or use drugs.  Family History:  Family History  Problem Relation Age of Onset  . Heart disease Mother     with pacemaker   . Asthma Father   . Heart attack Maternal Grandfather 64  . Diabetes Paternal Grandmother   . Aortic stenosis Maternal Uncle   . Prostate cancer Maternal Uncle     dx unspecified age  . Prostate cancer Maternal Uncle     dx older than 50y  . Prostate cancer Maternal Uncle     dx older than 50y  . Prostate cancer Maternal Uncle 72    s/p prostatectomy  . Breast cancer Cousin 36    maternal 1st cousin; s/p lumpectomy and radiation  . Breast cancer Cousin     maternal 1st cousin dx mid-late 66s; s/p mastectomy     Prior to Admission medications   Medication Sig Start Date End Date Taking? Authorizing Provider  acetaminophen (TYLENOL) 325 MG tablet Take 325 mg by mouth every 6 (six) hours as needed for mild pain. Reported on 01/10/2016   Yes Historical Provider, MD  dexamethasone (DECADRON) 4 MG tablet Take 5 tabs (20 mg) 12 hours and 6 hours before taxol for first chemo. Take 5 tabs (20mg ) 12 hours before taxol for subsequent treatment. 05/03/16  Yes Lennis Marion Downer, MD  loratadine (CLARITIN) 10 MG tablet Take 10 mg by mouth daily.    Yes Historical Provider, MD  ondansetron (ZOFRAN) 8 MG tablet Take 1 tablet (8 mg total) by mouth every 8 (eight) hours as needed for nausea or vomiting. 01/08/16  Yes Lennis Marion Downer, MD  Potassium 99 MG TABS Take 1 tablet by mouth daily.   Yes Historical Provider, MD  simvastatin (ZOCOR) 20 MG tablet Take 20 mg by mouth at bedtime.     Yes Historical Provider, MD  traMADol (ULTRAM) 50 MG tablet Take 50-100 mg by mouth every 8 (eight) hours as needed for moderate pain.   Yes Historical Provider, MD  vitamin B-12 (CYANOCOBALAMIN) 1000 MCG tablet Take 1,000 mcg by mouth 2 (two) times daily.   Yes Historical Provider, MD  vitamin C  (ASCORBIC ACID) 500 MG tablet Take 500 mg by mouth daily.     Yes Historical Provider, MD  vitamin E 200 UNIT capsule Take 200 Units by mouth daily.     Yes Historical Provider, MD    Physical Exam: Vitals:   05/22/16 2029 05/22/16 2100 05/22/16 2102 05/22/16 2130  BP: 137/88 115/83 115/83 123/78  Pulse: (!) 138 96 (!) 135 61  Resp: 24 (!) 27 24 (!) 27  Temp:      TempSrc:      SpO2: 96% 97% 97% 94%  Weight:      Height:       General: Not in acute distress HEENT:       Eyes: PERRL, EOMI, no scleral icterus.       ENT: No discharge from the ears and nose, no pharynx injection, no tonsillar enlargement.        Neck: No JVD, no bruit, no mass felt. Heme: No  neck lymph node enlargement. Cardiac: S1/S2, Irregularly irregular rhythm, No murmurs, No gallops or rubs. Respiratory: No rales, wheezing, rhonchi or rubs. GI: Soft, nondistended, nontender, no rebound pain, no organomegaly, BS present. GU: No hematuria Ext: No pitting leg edema bilaterally. 2+DP/PT pulse bilaterally. Musculoskeletal: No joint deformities, No joint redness or warmth, no limitation of ROM in spin. Skin: No rashes.  Neuro: Alert, oriented X3, cranial nerves II-XII grossly intact, moves all extremities normally. Psych: Patient is not psychotic, no suicidal or hemocidal ideation.  Labs on Admission: I have personally reviewed following labs and imaging studies  CBC:  Recent Labs Lab 05/16/16 0914 05/22/16 1734  WBC 1.8* 2.2*  NEUTROABS 1.4* 1.0*  HGB 12.1 11.6*  HCT 34.5* 33.8*  MCV 91.0 91.4  PLT 199 [id number]   Basic Metabolic Panel:  Recent Labs Lab 05/16/16 0914 05/22/16 1734  NA 137 138  K 3.8 3.1*  CL  --  107  CO2 18* 25  GLUCOSE 240* 103*  BUN 21.8 24*  CREATININE 1.2* 1.37*  CALCIUM 10.5* 9.9   GFR: Estimated Creatinine Clearance: 34.8 mL/min (by C-G formula based on SCr of 1.37 mg/dL (H)). Liver Function Tests:  Recent Labs Lab 05/16/16 0914  AST 22  ALT 14  ALKPHOS 121    BILITOT 0.99  PROT 7.1  ALBUMIN 3.4*   No results for input(s): LIPASE, AMYLASE in the last 168 hours. No results for input(s): AMMONIA in the last 168 hours. Coagulation Profile: No results for input(s): INR, PROTIME in the last 168 hours. Cardiac Enzymes: No results for input(s): CKTOTAL, CKMB, CKMBINDEX, TROPONINI in the last 168 hours. BNP (last 3 results) No results for input(s): PROBNP in the last 8760 hours. HbA1C: No results for input(s): HGBA1C in the last 72 hours. CBG: No results for input(s): GLUCAP in the last 168 hours. Lipid Profile: No results for input(s): CHOL, HDL, LDLCALC, TRIG, CHOLHDL, LDLDIRECT in the last 72 hours. Thyroid Function Tests:  Recent Labs  05/22/16 1741  TSH 1.856   Anemia Panel: No results for input(s): VITAMINB12, FOLATE, FERRITIN, TIBC, IRON, RETICCTPCT in the last 72 hours. Urine analysis:    Component Value Date/Time   COLORURINE YELLOW 09/04/2015 2351   APPEARANCEUR CLOUDY (A) 09/04/2015 2351   LABSPEC 1.015 04/29/2016 1524   PHURINE 6.0 04/29/2016 1524   PHURINE 6.5 09/04/2015 2351   GLUCOSEU Negative 04/29/2016 1524   HGBUR Small 04/29/2016 1524   HGBUR NEGATIVE 09/04/2015 2351   HGBUR trace-lysed 03/08/2008 1541   BILIRUBINUR Negative 04/29/2016 1524   KETONESUR Negative 04/29/2016 1524   KETONESUR NEGATIVE 09/04/2015 2351   PROTEINUR 30 04/29/2016 1524   PROTEINUR NEGATIVE 09/04/2015 2351   UROBILINOGEN 0.2 04/29/2016 1524   NITRITE Negative 04/29/2016 1524   NITRITE NEGATIVE 09/04/2015 2351   LEUKOCYTESUR Negative 04/29/2016 1524   Sepsis Labs: @LABRCNTIP (procalcitonin:4,lacticidven:4) )No results found for this or any previous visit (from the past 240 hour(s)).   Radiological Exams on Admission: Dg Chest 2 View  Result Date: 05/22/2016 CLINICAL DATA:  Recheck heart rate, irregular pulse EXAM: CHEST  2 VIEW COMPARISON:  05/02/2016, 08/02/2015 FINDINGS: Postsurgical changes of the right breast/axilla. Posterior  stabilization rod and fixating screws within the spine. Multiple thoracic compression deformities at the lower thoracic spine, grossly unchanged. Diffuse interstitial opacities likely reflect chronic change. Left mid lung zone airspace opacity. Tiny bilateral pleural effusions or CP angle thickening. Stable cardiomediastinal silhouette. No pneumothorax. IMPRESSION: 1. Oval opacity left mid lung zone could relate to an area of inflammatory infiltrate.  2. Diffuse interstitial opacities, likely reflecting chronic change. Tiny bilateral effusions or pleural thickening. 3. Known metastatic pulmonary nodules are better appreciated on recent PET-CT. 4. Moderate severe compression deformities of the lower thoracic spine, grossly unchanged. Electronically Signed   By: Donavan Foil M.D.   On: 05/22/2016 18:49     EKG: Independently reviewed. Atrial fibrillation with RVR, QTC 489, nonspecific T-wave changes.   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 Card, Dr. Sallyanne Kuster was consulted--> recommended to start Eliquis and metoprolol-->likelyTEE-guided cardioversion on Friday. CHA2DS2-VASc Score is 3, will needs oral anticoagulation.   - Admit to step down unit since patient needs Cardizem drip titrate -highly appreciate cardiology's consultation and recommendations. -Control the heart rate with Cardizem gtt  -started Eliquis 5 mg bid -started metoprolol 25 mg tid per card - cycle CEs, every 6 hours 3. - 2-D echo  - will get Free T4 and T3 -RIsk stratify with FLP and A1c -keep Mg >2.0  HLD: Last LDL was 42 -Continue home medications: Zocor -Check FLP  HTN: Blood pressure 128/80. No on  med at home -on Cardizem drip and metoprolol as above  Hypokalemia: K=3.1  on admission. - Repleted - Check Mg level  AoCKD-II: Baseline Cre is 140-1.1, pt's Cre is 1.37 on admission. Likely due to prerenal secondary to dehydration. - IVF: NS 75 cc/h - Check FeNa  - Follow up renal function by BMP  Breast cancer metastasized to multiple sites [facility]): pt is followed up and treated by oncologist, Dr. Marko Plume and radiation oncologist, Dr. Sondra Come. Currently on chemo and XRT. -f/u with Dr. Marko Plume and Dr. Sondra Come  DVT ppx: On Eliquis Code Status: DNR Family Communication: Yes, patient's mother, uncle and aunt at bed side Disposition Plan:  Anticipate discharge back to previous home environment Consults called: Card, Dr. Sallyanne Kuster Admission status: SDU/inpation       Date of Service 05/22/2016    Ivor Costa [facility] Pager [phone]  If 7PM-7AM, please contact night-coverage [email or web address] Password [email or web address] 05/22/2016, 9:51 PM

## 2016-05-22 NOTE — Progress Notes (Signed)
  Radiation Oncology         ([phone]) [phone] ________________________________  Name: Lisa [name] MRN: [id number]  Date: 05/22/2016  DOB: [birth year]  Weekly Radiation Therapy Management    ICD-9-CM ICD-10-CM   1. Carcinoma of breast metastatic to bone, unspecified laterality (HCC) 174.9 C50.919    198.5 C79.51      Current Dose: 7 Gy     Planned Dose:  28 Gy  Narrative . . . . . . . . The patient presents for UNSCHEDULED under treatment assessment.                                    The patient presents today some tightness in her chest and some SOB. Initial assessment showed an elevated heart rate and irregular pulse.                                  Set-up films were reviewed.                                 The chart was checked. Physical Findings. . .  blood pressure is 140/61 and her pulse is 78. Her oxygen saturation is 97%.   Lungs are clear to auscultation bilaterally. Heart has an irregular rhythm and elevated rate. Impression . . . . . . . The patient may be in A-Fib (new onset). Plan . . . . . . . . . . . . The patient was transferred to the ED for further evaluation. ________________________________   Blair Promise, PhD, MD  This document serves as a record of services personally performed by Gery Pray, MD. It was created on his behalf by Darcus Austin, a trained medical scribe. The creation of this record is based on the scribe's personal observations and the provider's statements to them. This document has been checked and approved by the attending provider.

## 2016-05-22 NOTE — ED Notes (Signed)
Cardiologist at bedside.  

## 2016-05-22 NOTE — Progress Notes (Signed)
Patient was brought to the ER by wheelchair accompanied by her Aunt.  Gave report to [place], Therapist, sports.

## 2016-05-22 NOTE — ED Notes (Signed)
Assisted patient to the restroom and back to bed. Pt tolerated it well with her walker but was short of breath with walking.

## 2016-05-22 NOTE — ED Provider Notes (Addendum)
[facility] DEPT Provider Note   CSN: [id number] Arrival date & time: 05/22/16  1659     History   Chief Complaint Chief Complaint  Patient presents with  . Tachycardia    HPI Lisa [name] is a 75 y.o. female.Complains of generalized weakness onset 2 weeks ago. She was seen by Dr.Kinard earlier today who determined the patient had irregularly irregular tachycardic rhythm and A. fib atrial fibrillation likely. Time of onset unknown. She denies shortness of breath. She denies chest pain. Denies other associated symptoms. No treatment prior to coming here.Nothing makes symptoms better or worse.  HPI  Past Medical History:  Diagnosis Date  . Breast cancer ([place]) 07-13-1997   right  . Chronic back pain   . Complication of anesthesia   . Hypertension   . Pleural effusion 11-08-2008  . PONV (postoperative nausea and vomiting)   . Radiation 08/17/2015-08/29/2015   lower thoracic spine 22.5 gray  . Radiation 01/17/16-01/24/16   right femur 20Gy    Patient Active Problem List   Diagnosis Date Noted  . Genetic testing 05/19/2016  . 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 Breast cancer metastasized to bone, right ([place]) 05/07/2016  . Breast cancer metastasized to multiple sites, right ([place]) 05/07/2016  . Encounter for antineoplastic chemotherapy 03/06/2016  . Breast cancer metastasized to multiple sites ([place]) 12/09/2015  . Urinary frequency 11/10/2015  . High risk medication use 11/10/2015  . Postoperative anemia due to acute blood loss 09/14/2015  . Metastatic breast cancer ([place]) 09/01/2015  . Cord compression ([place]) 08/31/2015  . Pathologic compression fracture of thoracic vertebra ([place]) 08/31/2015  . Pathologic fracture of thoracic vertebrae 08/26/2015  . Dehydration 08/12/2015  . C. difficile colitis 08/12/2015  . CKD (chronic kidney disease) stage 3, GFR 30-59 ml/min  08/12/2015  . Unintentional weight loss 08/12/2015  . Cancer associated pain 08/12/2015  . Hypokalemia 08/08/2015  . Carcinoma of breast metastatic to bone ([place]) 08/07/2015  . Enteritis due to Clostridium difficile 08/06/2015  . Sepsis ([place]) 08/02/2015  . Diarrhea 08/02/2015  . AKI (acute kidney injury) ([place]) 08/02/2015  . Hypercalcemia 08/02/2015  . Acute kidney injury ([place])   . Bone metastases ([place])   . 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 PLEURAL EFFUSION 10/13/2008  . ACUTE BRONCHOSPASM 06/27/2008  . Disorder of bone and cartilage 02/03/2008  . UNS ADVRS EFF UNS RX MEDICINAL&BIOLOGICAL SBSTNC 01/04/2008  . HLD (hyperlipidemia) 07/24/2007  . Essential hypertension 07/24/2007  . Allergic rhinitis 07/24/2007  . BREAST CANCER, HX OF 07/24/2007    Past Surgical History:  Procedure Laterality Date  . ABDOMINAL HYSTERECTOMY  02-21-1982  . BREAST BIOPSY  07-13-1997  . DILATION AND CURETTAGE, DIAGNOSTIC / THERAPEUTIC  12-13-1981  . MASTECTOMY  07-29-1997  . PELVIC LAPAROSCOPY  11-06-1981  . POSTERIOR LUMBAR FUSION 4 LEVEL N/A 09/01/2015   Procedure: Thoracic six-Lumbar one fusion with arthrodesis pedicle screw stabilization and decompression;  Surgeon: Ashok Pall, MD;  Location: [facility];  Service: Neurosurgery;  Laterality: N/A;  T6-L1 fusion with arthrodesis pedicle screw stabilization and decompression  . THORACENTESIS  10-18-2008    OB History    No data available       Home Medications    Prior to Admission medications   Medication Sig Start Date End Date Taking? Authorizing Provider  acetaminophen (TYLENOL) 325 MG tablet Take 325 mg by mouth every 6 (six) hours as needed.  Reported on 01/10/2016    Historical Provider, MD  dexamethasone (DECADRON) 4 MG tablet Take 5 tabs (20 mg) 12 hours and 6 hours before taxol for first chemo. Take 5 tabs (20mg ) 12 hours before taxol for subsequent treatment. 05/03/16   Lennis Marion Downer, MD  ferrous sulfate 325 (65  FE) MG tablet Take 325 mg by mouth 2 (two) times a week. Reported on 02/05/2016    Historical Provider, MD  loratadine (CLARITIN) 10 MG tablet Take 10 mg by mouth daily.     Historical Provider, MD  Multiple Vitamins-Minerals (MULTIVITAMIN WITH MINERALS) tablet Take 1 tablet by mouth daily. Reported on 02/05/2016    Historical Provider, MD  ondansetron (ZOFRAN) 8 MG tablet Take 1 tablet (8 mg total) by mouth every 8 (eight) hours as needed for nausea or vomiting. 01/08/16   Lennis Marion Downer, MD  Potassium 99 MG TABS Take 1 tablet by mouth daily.    Historical Provider, MD  simvastatin (ZOCOR) 20 MG tablet Take 20 mg by mouth at bedtime.      Historical Provider, MD  traMADol (ULTRAM) 50 MG tablet Take 50-100 mg by mouth every 8 (eight) hours as needed for moderate pain.    Historical Provider, MD  vitamin B-12 (CYANOCOBALAMIN) 1000 MCG tablet Take 1,000 mcg by mouth 2 (two) times daily.    Historical Provider, MD  vitamin C (ASCORBIC ACID) 500 MG tablet Take 500 mg by mouth daily.      Historical Provider, MD  vitamin E 200 UNIT capsule Take 200 Units by mouth daily.      Historical Provider, MD    Family History Family History  Problem Relation Age of Onset  . Heart disease Mother     with pacemaker   . Asthma Father   . Heart attack Maternal Grandfather 64  . Diabetes Paternal Grandmother   . Aortic stenosis Maternal Uncle   . Prostate cancer Maternal Uncle     dx unspecified age  . Prostate cancer Maternal Uncle     dx older than 50y  . Prostate cancer Maternal Uncle     dx older than 50y  . Prostate cancer Maternal Uncle 72    s/p prostatectomy  . Breast cancer Cousin 56    maternal 1st cousin; s/p lumpectomy and radiation  . Breast cancer Cousin     maternal 1st cousin dx mid-late 9s; s/p mastectomy    Social History Social History  Substance Use Topics  . Smoking status: Former Smoker    Packs/day: 0.50    Years: 30.00    Types: Cigarettes    Quit date: 07/22/1996  .  Smokeless tobacco: Never Used     Comment: 1/2 up to 1 ppd  . Alcohol use No     Comment: hx of alcohol socially - hasn't had drink since March 1998     Allergies   Diphenhydramine hcl; Codeine sulfate; Oseltamivir phosphate; Oxycodone-acetaminophen; Prednisone; and Sudafed [pseudoephedrine hcl]   Review of Systems Review of Systems  HENT: Negative.   Respiratory: Negative.   Cardiovascular: Negative.   Gastrointestinal: Negative.   Musculoskeletal: Negative.   Skin: Negative.   Allergic/Immunologic: Positive for immunocompromised state.       Cancer patient  Neurological: Positive for weakness.  Psychiatric/Behavioral: Negative.   All other systems reviewed and are negative.    Physical Exam Updated Vital Signs BP 132/83 (BP Location: Left Arm)   Pulse (!) 140   Temp 98.3 F (36.8 C) (Oral)   Resp  19   Ht 5\' 2"  (1.575 m)   Wt 161 lb 8 oz (73.3 kg)   SpO2 99%   BMI 29.54 kg/m   Physical Exam  Constitutional: She appears well-developed and well-nourished. No distress.  HENT:  Head: Normocephalic and atraumatic.  Eyes: Conjunctivae are normal. Pupils are equal, round, and reactive to light.  Neck: Neck supple. No tracheal deviation present. No thyromegaly present.  Cardiovascular:  No murmur heard. Tachycardic. Irregularly irregular  Pulmonary/Chest: Effort normal and breath sounds normal.  Abdominal: Soft. Bowel sounds are normal. She exhibits no distension. There is no tenderness.  Musculoskeletal: Normal range of motion. She exhibits no edema or tenderness.  Neurological: She is alert. Coordination normal.  Skin: Skin is warm and dry. No rash noted.  Psychiatric: She has a normal mood and affect.  Nursing note and vitals reviewed.    ED Treatments / Results  Labs (all labs ordered are listed, but only abnormal results are displayed) Labs Reviewed  BASIC METABOLIC PANEL  CBC  TSH  CBC WITH DIFFERENTIAL/PLATELET  BASIC METABOLIC PANEL    EKG  EKG  Interpretation  Date/Time:  Wednesday May 22 2016 17:15:49 EDT Ventricular Rate:  165 PR Interval:    QRS Duration: 88 QT Interval:  295 QTC Calculation: 489 R Axis:   1 Text Interpretation:  Atrial fibrillation with rapid V-rate Abnormal R-wave progression, early transition Confirmed by Jeneen Rinks  MD, [place] ([id number]) on 05/22/2016 5:22:32 PM      Tracing from 09/01/2015 showed normal sinus rhythm Radiology No results found.  Procedures Procedures (including critical care time)  Medications Ordered in ED Medications  diltiazem (CARDIZEM) injection 10 mg (not administered)   Results for orders placed or performed during the hospital encounter of 05/22/16  TSH  Result Value Ref Range   TSH 1.856 0.350 - 4.500 uIU/mL  CBC with Differential/Platelet  Result Value Ref Range   WBC 2.2 (L) 4.0 - 10.5 K/uL   RBC 3.70 (L) 3.87 - 5.11 MIL/uL   Hemoglobin 11.6 (L) 12.0 - 15.0 g/dL   HCT 33.8 (L) 36.0 - 46.0 %   MCV 91.4 78.0 - 100.0 fL   MCH 31.4 26.0 - 34.0 pg   MCHC 34.3 30.0 - 36.0 g/dL   RDW 14.4 11.5 - 15.5 %   Platelets 251 150 - 400 K/uL   Neutrophils Relative % 42 %   Lymphocytes Relative 38 %   Monocytes Relative 15 %   Eosinophils Relative 4 %   Basophils Relative 1 %   Neutro Abs 1.0 (L) 1.7 - 7.7 K/uL   Lymphs Abs 0.8 0.7 - 4.0 K/uL   Monocytes Absolute 0.3 0.1 - 1.0 K/uL   Eosinophils Absolute 0.1 0.0 - 0.7 K/uL   Basophils Absolute 0.0 0.0 - 0.1 K/uL   WBC Morphology WHITE COUNT CONFIRMED ON SMEAR   Basic metabolic panel  Result Value Ref Range   Sodium 138 135 - 145 mmol/L   Potassium 3.1 (L) 3.5 - 5.1 mmol/L   Chloride 107 101 - 111 mmol/L   CO2 25 22 - 32 mmol/L   Glucose, Bld 103 (H) 65 - 99 mg/dL   BUN 24 (H) 6 - 20 mg/dL   Creatinine, Ser 1.37 (H) 0.44 - 1.00 mg/dL   Calcium 9.9 8.9 - 10.3 mg/dL   GFR calc non Af Amer 38 (L) >60 mL/min   GFR calc Af Amer 43 (L) >60 mL/min   Anion gap 6 5 - 15   Dg Chest 2  View  Result Date: 05/22/2016 CLINICAL  DATA:  Recheck heart rate, irregular pulse EXAM: CHEST  2 VIEW COMPARISON:  05/02/2016, 08/02/2015 FINDINGS: Postsurgical changes of the right breast/axilla. Posterior stabilization rod and fixating screws within the spine. Multiple thoracic compression deformities at the lower thoracic spine, grossly unchanged. Diffuse interstitial opacities likely reflect chronic change. Left mid lung zone airspace opacity. Tiny bilateral pleural effusions or CP angle thickening. Stable cardiomediastinal silhouette. No pneumothorax. IMPRESSION: 1. Oval opacity left mid lung zone could relate to an area of inflammatory infiltrate. 2. Diffuse interstitial opacities, likely reflecting chronic change. Tiny bilateral effusions or pleural thickening. 3. Known metastatic pulmonary nodules are better appreciated on recent PET-CT. 4. Moderate severe compression deformities of the lower thoracic spine, grossly unchanged. Electronically Signed   By: Donavan Foil M.D.   On: 05/22/2016 18:49   Nm Pet Image Restag (ps) Skull Base To Thigh  Result Date: 05/02/2016 CLINICAL DATA:  Subsequent treatment strategy for metastatic breast cancer. EXAM: NUCLEAR MEDICINE PET SKULL BASE TO THIGH TECHNIQUE: 8.21 mCi F-18 FDG was injected intravenously. Full-ring PET imaging was performed from the skull base to thigh after the radiotracer. CT data was obtained and used for attenuation correction and anatomic localization. FASTING BLOOD GLUCOSE:  Value: 102 mg/dl COMPARISON:  PET-CT 12/22/2015. FINDINGS: NECK No hypermetabolic lymph nodes in the neck. However, there is some poorly defined infiltrative appearing soft tissue haziness in the in lower neck adjacent to the thyroid gland which is diffusely hypermetabolic (SUVmax = 6.3). CHEST Numerous pulmonary nodules are again noted throughout the lungs bilaterally, generally increased in size and number compared to the prior examination, with the largest of these nodules in the right lung associated with  the right major fissure (image 31 of series 7) measuring 13 x 16 mm (SUVmax = 29.1). Large partially calcified pleural-based mass in the inferior aspect of the right lower lobe appear is similar to the prior examination, and is again hypermetabolic (SUVmax = [id number]). Several hypermetabolic bilateral hilar and mediastinal lymph nodes are noted, with the most hypermetabolism in a subcarinal lymph node (SUVmax = 8.1), which appears borderline enlarged measuring approximately 8 mm. Mild cardiomegaly. There is aortic atherosclerosis, as well as atherosclerosis of the great vessels of the mediastinum and the coronary arteries, including calcified atherosclerotic plaque in the left anterior descending and right coronary arteries. Calcifications of the aortic valve. Small left pleural effusion. Mild diffuse interlobular septal thickening in the lungs, favored to reflect a background of very mild interstitial pulmonary edema. ABDOMEN/PELVIS There are some patchy areas of heterogeneous increased metabolic activity in the liver, most of which have no discrete CT correlate. However, there are some tiny capsular areas overlying the right lobe of the liver that are partially calcified (image 93 of series 4) which correspond to hypermetabolic lesions (SUVmax = 9.6)on the PET portion of the examination, concerning for hepatic metastasis. No abnormal hypermetabolic activity within the pancreas, adrenal glands, or spleen. In the distal right common iliac nodal chain there is an enlarged lymph node measuring 12 mm in short axis (image 134 of series 4) which is hypermetabolic (SUVmax = 6.9), similar to the prior examination. Low-attenuation lesion in the lower pole of the left kidney is incompletely characterized, but presumably a parapelvic cysts, similar to the prior study. No significant volume of ascites. No pneumoperitoneum. No pathologic dilatation of small bowel or colon. Status post hysterectomy. Ovaries are not confidently  identified may be surgically absent or atrophic. SKELETON Innumerable hypermetabolic lesions are noted throughout  the visualized axial and appendicular skeleton. Many of these are old lesions seen on prior examinations, however, these generally appear increased in size and number. Specific examples include new lesions in the right skull base just lateral to the foramen magnum (SUVmax = 10.8), a much larger hypermetabolic lesion (SUVmax = 20.0)which is mixed lytic and sclerotic in the right scapular glenoid(image 44 of series 4), and persistent increase in areas of irregular lucency and sclerosis throughout the sacrum which are diffusely hypermetabolic (SUVmax = [id number]). Multiple other osseous lesions are also noted. Orthopedic fixation hardware in the lower thoracic and upper lumbar spine again noted. Surgical clips in the right axillary region from prior lymph node dissection. Status post right modified radical mastectomy. IMPRESSION: 1. Today's study demonstrates general progression of disease, as evidenced by increased number and size of numerous osseous lesions and pulmonary lesions, as discussed above. 2. Additional incidental findings, as above. Electronically Signed   By: Vinnie Langton M.D.   On: 05/02/2016 14:43   Chest x-ray viewed by me Initial Impression / Assessment and Plan / ED Course  I have reviewed the triage vital signs and the nursing notes.  Pertinent labs & imaging results that were available during my care of the patient were reviewed by me and considered in my medical decision making (see chart for details).  Clinical Course   8:15 PM patient resting comfortably. Heart rate has slowed from 140s to 120s after treatment with intravenous Cardizem bolus and intravenous Cardizem drip. Dr.Croituro evaluate patient in the ED and requested admission to hospitalist service. Plan is to anticoagulate patient and later cardiovert. Oral potassium supplementation ordered. Dr.Niu hospitalist  service consulted and saw patient in the emergency department requests admission to stepdown bed   [id number] Score calculated at 3 Final Clinical Impressions(s) / ED Diagnoses  Diagnoses #1 atrial fibrillation or with rapid ventricular response #2 anemia #3 hypokalemia Final diagnoses:  None   CRITICAL CARE Performed by: Orlie Dakin Total critical care time: 30 minutes Critical care time was exclusive of separately billable procedures and treating other patients. Critical care was necessary to treat or prevent imminent or life-threatening deterioration. Critical care was time spent personally by me on the following activities: development of treatment plan with patient and/or surrogate as well as nursing, discussions with consultants, evaluation of patient's response to treatment, examination of patient, obtaining history from patient or surrogate, ordering and performing treatments and interventions, ordering and review of laboratory studies, ordering and review of radiographic studies, pulse oximetry and re-evaluation of patient's condition. New Prescriptions New Prescriptions   No medications on file     Orlie Dakin, MD 05/22/16 2022    Orlie Dakin, MD 05/22/16 2025    Orlie Dakin, MD 05/23/16 0140

## 2016-05-22 NOTE — Consult Note (Signed)
Reason for Consult: New onset atrial fibrillation with rapid ventricular response  Requesting Physician: Blaine Hamper  Cardiologist: New  HPI: This is a 75 y.o. female with a past medical history significant for a long struggle with breast cancer since 1998, now with metastatic lesions involving the pelvis and spine after previously having had pleural involvement requiring surgical thoracentesis (2010). She is currently receiving chemotherapy with Taxol and has previously undergone radiation to the low thoracic spine for metastasis that was threatening spinal compression. She does not have brain metastasis. Most recent oncological evaluation was a PET scan on October 12 that did not show any involvement of the brain, but did show some lesions in the right skull base lateral to the foramen magnum, as well as showing general progression of disease with an increased number and size of numerous bone and pulmonary lesions..  During the visit to the radiation oncology clinic she describes some chest discomfort and shortness of breath and she was found to have an irregular rapid pulse and was taken to the emergency room. She was found to be in atrial fibrillation with rapid ventricular response. She feels unwell but is not in acute distress. She believes her symptoms began as much as 2 weeks ago. She has never been diagnosed with atrial fibrillation or other cardiac problems in the past.  She has mild to moderate exertional dyspnea but does not have exertional angina. She has not experienced syncope and is not really aware of palpitations. She does not have orthopnea or PND and has not had leg edema.  Intravenous diltiazem has been started in the emergency room but she remains moderately tachycardic around the 125 bpm.  PMHx:  Past Medical History:  Diagnosis Date  . Breast cancer ([place]) 07-13-1997   right  . Chronic back pain   . Complication of anesthesia   . Hypertension   . Pleural effusion  11-08-2008  . PONV (postoperative nausea and vomiting)   . Radiation 08/17/2015-08/29/2015   lower thoracic spine 22.5 gray  . Radiation 01/17/16-01/24/16   right femur 20Gy   Past Surgical History:  Procedure Laterality Date  . ABDOMINAL HYSTERECTOMY  02-21-1982  . BREAST BIOPSY  07-13-1997  . DILATION AND CURETTAGE, DIAGNOSTIC / THERAPEUTIC  12-13-1981  . MASTECTOMY  07-29-1997  . PELVIC LAPAROSCOPY  11-06-1981  . POSTERIOR LUMBAR FUSION 4 LEVEL N/A 09/01/2015   Procedure: Thoracic six-Lumbar one fusion with arthrodesis pedicle screw stabilization and decompression;  Surgeon: Ashok Pall, MD;  Location: [facility];  Service: Neurosurgery;  Laterality: N/A;  T6-L1 fusion with arthrodesis pedicle screw stabilization and decompression  . THORACENTESIS  10-18-2008    FAMHx: Family History  Problem Relation Age of Onset  . Heart disease Mother     with pacemaker   . Asthma Father   . Heart attack Maternal Grandfather 64  . Diabetes Paternal Grandmother   . Aortic stenosis Maternal Uncle   . Prostate cancer Maternal Uncle     dx unspecified age  . Prostate cancer Maternal Uncle     dx older than 50y  . Prostate cancer Maternal Uncle     dx older than 50y  . Prostate cancer Maternal Uncle 72    s/p prostatectomy  . Breast cancer Cousin 102    maternal 1st cousin; s/p lumpectomy and radiation  . Breast cancer Cousin     maternal 1st cousin dx mid-late 71s; s/p mastectomy    SOCHx:  reports that she quit smoking about 40  years ago. Her smoking use included Cigarettes. She has a 15.00 pack-year smoking history. She has never used smokeless tobacco. She reports that she does not drink alcohol or use drugs.  ALLERGIES: Allergies  Allergen Reactions  . Diphenhydramine Hcl Other (See Comments)    Severe headaches  . Codeine Sulfate Rash    REACTION: rash. Makes her jittery  . Oseltamivir Phosphate Nausea And Vomiting    REACTION: rash  . Oxycodone-Acetaminophen Nausea And Vomiting     REACTION: emesis  . Prednisone Other (See Comments)    REACTION: hiatal hernia, runny  Nose, felt terrible  . Sudafed [Pseudoephedrine Hcl] Other (See Comments)    vertigo    ROS: Pertinent items are noted in HPI. Pertinent items noted in HPI and remainder of comprehensive ROS otherwise negative.  HOME MEDICATIONS: No current facility-administered medications on file prior to encounter.    Current Outpatient Prescriptions on File Prior to Encounter  Medication Sig Dispense Refill  . acetaminophen (TYLENOL) 325 MG tablet Take 325 mg by mouth every 6 (six) hours as needed for mild pain. Reported on 01/10/2016    . dexamethasone (DECADRON) 4 MG tablet Take 5 tabs (20 mg) 12 hours and 6 hours before taxol for first chemo. Take 5 tabs (20mg ) 12 hours before taxol for subsequent treatment. 20 tablet 2  . loratadine (CLARITIN) 10 MG tablet Take 10 mg by mouth daily.     . ondansetron (ZOFRAN) 8 MG tablet Take 1 tablet (8 mg total) by mouth every 8 (eight) hours as needed for nausea or vomiting. 20 tablet 2  . Potassium 99 MG TABS Take 1 tablet by mouth daily.    . simvastatin (ZOCOR) 20 MG tablet Take 20 mg by mouth at bedtime.      . traMADol (ULTRAM) 50 MG tablet Take 50-100 mg by mouth every 8 (eight) hours as needed for moderate pain.    . vitamin B-12 (CYANOCOBALAMIN) 1000 MCG tablet Take 1,000 mcg by mouth 2 (two) times daily.    . vitamin C (ASCORBIC ACID) 500 MG tablet Take 500 mg by mouth daily.      . vitamin E 200 UNIT capsule Take 200 Units by mouth daily.        HOSPITAL MEDICATIONS: I have reviewed the patient's current medications. Scheduled: . aspirin EC  81 mg Oral Daily  . heparin subcutaneous  5,000 Units Subcutaneous Q8H  . loratadine  10 mg Oral Daily  . metoprolol tartrate  25 mg Oral TID  . simvastatin  20 mg Oral QHS  . vitamin B-12  1,000 mcg Oral BID  . vitamin C  500 mg Oral Daily  . vitamin E  200 Units Oral Daily   Continuous: . sodium chloride    .  diltiazem (CARDIZEM) infusion      VITALS: Blood pressure 137/88, pulse (!) 138, temperature 98.7 F (37.1 C), temperature source Oral, resp. rate 24, height 5\' 2"  (1.575 m), weight 161 lb 8 oz (73.3 kg), SpO2 96 %.  PHYSICAL EXAM:  General: Alert, oriented x3, no distress Head: no evidence of trauma, PERRL, EOMI, no exophtalmos or lid lag, no myxedema, no xanthelasma; normal ears, nose and oropharynx Neck: normal jugular venous pulsations and no hepatojugular reflux; brisk carotid pulses without delay and no carotid bruits Chest: clear to auscultation, no signs of consolidation by percussion or palpation, normal fremitus, symmetrical and full respiratory excursions Cardiovascular: normal position and quality of the apical impulse, rapid ir regular rhythm, normal first heart sound  and normal second heart sound, no rubs or gallops, no murmur Abdomen: no tenderness or distention, no masses by palpation, no abnormal pulsatility or arterial bruits, normal bowel sounds, no hepatosplenomegaly Extremities: no clubbing, cyanosis;  no edema; 2+ radial, ulnar and brachial pulses bilaterally; 2+ right femoral, posterior tibial and dorsalis pedis pulses; 2+ left femoral, posterior tibial and dorsalis pedis pulses; no subclavian or femoral bruits Neurological: grossly nonfocal   LABS  CBC  Recent Labs  05/22/16 1734  WBC 2.2*  NEUTROABS 1.0*  HGB 11.6*  HCT 33.8*  MCV 91.4  PLT [id number]   Basic Metabolic Panel  Recent Labs  05/22/16 1734  NA 138  K 3.1*  CL 107  CO2 25  GLUCOSE 103*  BUN 24*  CREATININE 1.37*  CALCIUM 9.9   Thyroid Function Tests  Recent Labs  05/22/16 1741  TSH 1.856      IMAGING: Dg Chest 2 View  Result Date: 05/22/2016 CLINICAL DATA:  Recheck heart rate, irregular pulse EXAM: CHEST  2 VIEW COMPARISON:  05/02/2016, 08/02/2015 FINDINGS: Postsurgical changes of the right breast/axilla. Posterior stabilization rod and fixating screws within the spine.  Multiple thoracic compression deformities at the lower thoracic spine, grossly unchanged. Diffuse interstitial opacities likely reflect chronic change. Left mid lung zone airspace opacity. Tiny bilateral pleural effusions or CP angle thickening. Stable cardiomediastinal silhouette. No pneumothorax. IMPRESSION: 1. Oval opacity left mid lung zone could relate to an area of inflammatory infiltrate. 2. Diffuse interstitial opacities, likely reflecting chronic change. Tiny bilateral effusions or pleural thickening. 3. Known metastatic pulmonary nodules are better appreciated on recent PET-CT. 4. Moderate severe compression deformities of the lower thoracic spine, grossly unchanged. Electronically Signed   By: Donavan Foil M.D.   On: 05/22/2016 18:49    ECG: Atrial fibrillation with rapid ventricular response, likely early RS transition in lead V2, otherwise normal  TELEMETRY:  atrial fibrillation with RVR  IMPRESSION: 1. Newly diagnosed atrial fibrillation rapid ventricular response, likely persistent over the last 2 weeks 2. Widely metastatic breast cancer involving the skeleton and lungs  RECOMMENDATION: 1. Initial approach is with rate control medications. Will add beta blockers to the intravenous diltiazem. The dose of metoprolol tartrate can be rapidly upwardly titrated as needed to achieve a resting heart rate of [id number]. 2. Due to uncertain and possibly lengthy duration of arrhythmia, early cardioversion is not an appropriate option.  3. Start anticoagulation. She is best suited for a direct oral anticoagulant such as Eliquis 5 mg BID. She does not appear to have any intracerebral metastases, although she does have one lesion in the base of the skull.  4. Consider TEE-guided cardioversion. Ideally, we'll get 3 doses of anticoagulant administered before we proceed with a TEE-cardioversion. Discussed both parts of this procedure in detail with the patient and her family and they seem to be agreeable  to this approach. They understand that cardioversion cannot be safely performed without anticoagulation and that any anticoagulant choice comes with a significantly increased risk of bleeding. The patient specifically does not want to take warfarin, since her father had a poor experience with this medication.  5. Hopefully logistics will allow scheduling of the cardioversion on Friday. If we can achieve good rate control, her symptoms improve and Friday cardioversion cannot be performed, may have to delay until the following week.   Time Spent Directly with Patient: 45 minutes  [name], MD, [facility] [phone] office [phone] pager   05/22/2016, 8:56 PM

## 2016-05-22 NOTE — Progress Notes (Addendum)
Patient here to recheck her heart rate from yesterday.  Her pulse was 78 when first taken and irregular.  Put patient on heart monitor and she is in atrial fibrillation/SVT with a rate ranging from 136-163.  Patient reports feeling short of breath. Notified Dr. Sondra Come and also called Barbaraann Share, RN with Dr. Mariana Kaufman office as patient is scheduled for Taxol tomorrow.  Patient has agreed to go to the ER to be evaluated after her radiation treatment today.

## 2016-05-23 ENCOUNTER — Telehealth: Payer: Self-pay | Admitting: Oncology

## 2016-05-23 ENCOUNTER — Encounter (HOSPITAL_COMMUNITY): Payer: Self-pay | Admitting: Radiology

## 2016-05-23 ENCOUNTER — Inpatient Hospital Stay (HOSPITAL_COMMUNITY): Payer: Medicare Other

## 2016-05-23 ENCOUNTER — Other Ambulatory Visit: Payer: Self-pay | Admitting: Oncology

## 2016-05-23 ENCOUNTER — Ambulatory Visit: Payer: Medicare Other

## 2016-05-23 ENCOUNTER — Other Ambulatory Visit: Payer: Medicare Other

## 2016-05-23 ENCOUNTER — Ambulatory Visit: Payer: Medicare Other | Admitting: Oncology

## 2016-05-23 DIAGNOSIS — I4891 Unspecified atrial fibrillation: Secondary | ICD-10-CM

## 2016-05-23 DIAGNOSIS — R000 Tachycardia, unspecified: Secondary | ICD-10-CM

## 2016-05-23 DIAGNOSIS — C7800 Secondary malignant neoplasm of unspecified lung: Secondary | ICD-10-CM

## 2016-05-23 DIAGNOSIS — C787 Secondary malignant neoplasm of liver and intrahepatic bile duct: Secondary | ICD-10-CM

## 2016-05-23 DIAGNOSIS — C50919 Malignant neoplasm of unspecified site of unspecified female breast: Secondary | ICD-10-CM

## 2016-05-23 DIAGNOSIS — C7951 Secondary malignant neoplasm of bone: Secondary | ICD-10-CM

## 2016-05-23 DIAGNOSIS — Z87891 Personal history of nicotine dependence: Secondary | ICD-10-CM

## 2016-05-23 LAB — BASIC METABOLIC PANEL
Anion gap: 7 (ref 5–15)
BUN: 21 mg/dL — AB (ref 6–20)
CALCIUM: 9.2 mg/dL (ref 8.9–10.3)
CO2: 21 mmol/L — AB (ref 22–32)
CREATININE: 1.06 mg/dL — AB (ref 0.44–1.00)
Chloride: 112 mmol/L — ABNORMAL HIGH (ref 101–111)
GFR calc non Af Amer: 51 mL/min — ABNORMAL LOW (ref >60)
GFR, EST AFRICAN AMERICAN: 59 mL/min — AB (ref >60)
GLUCOSE: 129 mg/dL — AB (ref 65–99)
Potassium: 3.5 mmol/L (ref 3.5–5.1)
Sodium: 140 mmol/L (ref 135–145)

## 2016-05-23 LAB — CBC
HCT: 33.6 % — ABNORMAL LOW (ref 36.0–46.0)
Hemoglobin: 11.4 g/dL — ABNORMAL LOW (ref 12.0–15.0)
MCH: 32 pg (ref 26.0–34.0)
MCHC: 33.9 g/dL (ref 30.0–36.0)
MCV: 94.4 fL (ref 78.0–100.0)
PLATELETS: 291 10*3/uL (ref 150–400)
RBC: 3.56 MIL/uL — ABNORMAL LOW (ref 3.87–5.11)
RDW: 14.7 % (ref 11.5–15.5)
WBC: 2.9 10*3/uL — ABNORMAL LOW (ref 4.0–10.5)

## 2016-05-23 LAB — CREATININE, URINE, RANDOM: CREATININE, URINE: 20.9 mg/dL

## 2016-05-23 LAB — SODIUM, URINE, RANDOM: SODIUM UR: 78 mmol/L

## 2016-05-23 LAB — TROPONIN I: Troponin I: <0.03 ng/mL (ref <0.03)

## 2016-05-23 LAB — LIPID PANEL
CHOLESTEROL: 134 mg/dL (ref 0–200)
HDL: 31 mg/dL — AB (ref >40)
LDL Cholesterol: 54 mg/dL (ref 0–99)
TRIGLYCERIDES: 247 mg/dL — AB (ref <150)
Total CHOL/HDL Ratio: 4.3 RATIO
VLDL: 49 mg/dL — ABNORMAL HIGH (ref 0–40)

## 2016-05-23 LAB — T4, FREE: Free T4: 1.04 ng/dL (ref 0.61–1.12)

## 2016-05-23 LAB — ECHOCARDIOGRAM COMPLETE
HEIGHTINCHES: 62 in
WEIGHTICAEL: 2584 [oz_av]

## 2016-05-23 MED ORDER — SODIUM CHLORIDE 0.9 % IV SOLN: INTRAVENOUS | Status: DC

## 2016-05-23 MED ORDER — METOPROLOL TARTRATE 50 MG PO TABS
50.0000 mg | ORAL_TABLET | Freq: Two times a day (BID) | ORAL | Status: DC
  Administered 2016-05-23: 50 mg via ORAL
  Filled 2016-05-23: qty 1

## 2016-05-23 MED ORDER — FUROSEMIDE 10 MG/ML IJ SOLN
INTRAMUSCULAR | Status: AC
  Filled 2016-05-23: qty 4

## 2016-05-23 MED ORDER — IOPAMIDOL (ISOVUE-300) INJECTION 61%
75.0000 mL | Freq: Once | INTRAVENOUS | Status: AC | PRN
Start: 2016-05-23 — End: 2016-05-23
  Administered 2016-05-23: 75 mL via INTRAVENOUS

## 2016-05-23 MED ORDER — FUROSEMIDE 10 MG/ML IJ SOLN
40.0000 mg | Freq: Once | INTRAMUSCULAR | Status: AC
  Administered 2016-05-23: 40 mg via INTRAVENOUS

## 2016-05-23 NOTE — ED Notes (Signed)
15:19 PT CAN GO TO FLOOR.

## 2016-05-23 NOTE — Telephone Encounter (Signed)
Talked to [facility] nurse in the ER.  Lisa [name] does not want radiation treatment today.  Notified therapists on [place] [facility].

## 2016-05-23 NOTE — Progress Notes (Signed)
PT Cancellation Note  Patient Details Name: Lisa [name] MRN: [id number] DOB: 1941-03-24   Cancelled Treatment:    Reason Eval/Treat Not Completed: Other (comment) (New onset afib with RVR) Pt currently in ED with plans to be admitted to [facility] or [facility].  Awaiting transfer to telemetry floor.  Current plan is for TEE/DCCV however endoscopy is booked for TEE/DCCV tomorrow and first available is Monday.   Debhora Titus,KATHrine E 05/23/2016, 3:18 PM Carmelia Bake, PT, DPT 05/23/2016 Pager: [phone]

## 2016-05-23 NOTE — Progress Notes (Signed)
  Echocardiogram 2D Echocardiogram has been performed.  Darlina Sicilian M 05/23/2016, 8:43 AM

## 2016-05-23 NOTE — Progress Notes (Signed)
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 No orthopnea, LE edema or PND. Awaiting transfer to a [facility] at [facility]. Still in the ER.   Inpatient Medications    Scheduled Meds: . apixaban  5 mg Oral BID  . loratadine  10 mg Oral Daily  . metoprolol tartrate  25 mg Oral TID  . simvastatin  20 mg Oral QHS  . vitamin B-12  1,000 mcg Oral BID  . vitamin C  500 mg Oral Daily  . vitamin E  200 Units Oral Daily   Continuous Infusions: . sodium chloride 75 mL/hr at 05/22/16 2250   PRN Meds: ALPRAZolam, ondansetron, traMADol, zolpidem   Vital Signs    Vitals:   05/23/16 0530 05/23/16 0700 05/23/16 0730 05/23/16 0804  BP: 96/60 94/63 103/70 117/78  Pulse: 76  (!) 57 105  Resp: (!) 31 (!) 31 21   Temp:      TempSrc:      SpO2: 95% 96% 96%   Weight:      Height:        Intake/Output Summary (Last 24 hours) at 05/23/16 0900 Last data filed at 05/23/16 0841  Gross per 24 hour  Intake             1100 ml  Output                0 ml  Net             1100 ml   Filed Weights   05/22/16 1716  Weight: 161 lb 8 oz (73.3 kg)    Physical Exam   GEN: Well nourished, well developed, in no acute distress.  HEENT: Grossly normal.  Neck: Supple, no JVD, carotid bruits, or masses. Cardiac: irreg irreg, no murmurs, rubs, or gallops. No clubbing, cyanosis, edema.  Radials/DP/PT 2+ and equal bilaterally.  Respiratory:  Respirations regular and unlabored, clear to auscultation bilaterally. GI: Soft, nontender, nondistended, BS + x 4. MS: no deformity or atrophy. Skin: warm and dry, no rash. Neuro:   Strength and sensation are intact. Psych: AAOx3.  Normal affect.  Labs    CBC  Recent Labs  05/22/16 1734 05/23/16 0304  WBC 2.2* 2.9*  NEUTROABS 1.0*  --   HGB 11.6* 11.4*  HCT 33.8* 33.6*  MCV 91.4 94.4  PLT 251 [id number]   Basic Metabolic Panel  Recent Labs  05/22/16 1734 05/22/16 2138 05/23/16 0304  NA 138  --  140  K 3.1*  --  3.5  CL 107  --  112*  CO2 25  --  21*  GLUCOSE 103*  --  129*  BUN 24*  --  21*  CREATININE 1.37*  --  1.06*  CALCIUM 9.9  --  9.2  MG  --  2.0  --    Liver Function Tests No results for input(s): AST, ALT, ALKPHOS, BILITOT, PROT, ALBUMIN in the last 72 hours. No results for input(s): LIPASE, AMYLASE in the last 72 hours. Cardiac Enzymes  Recent Labs  05/22/16 2138 05/23/16 0304  TROPONINI <0.03 <0.03   BNP  Invalid input(s): POCBNP D-Dimer No results for input(s): DDIMER in the last 72 hours. Hemoglobin A1C No results for input(s): HGBA1C in the last 72 hours. Fasting Lipid Panel  Recent Labs  05/23/16 0304  CHOL 134  HDL 31*  LDLCALC 54  TRIG 247*  CHOLHDL 4.3   Thyroid Function Tests  Recent Labs  05/22/16 1741  TSH 1.856    Telemetry    afib with controlled VR - Personally Reviewed  ECG    Atrial fibrillation with rapid V-rate HR 165 - Personally Reviewed  Radiology    Dg Chest 2 View  Result Date: 05/22/2016 CLINICAL DATA:  Recheck heart rate, irregular pulse EXAM: CHEST  2 VIEW COMPARISON:  05/02/2016, 08/02/2015 FINDINGS: Postsurgical changes of the right breast/axilla. Posterior stabilization rod and fixating screws within the spine. Multiple thoracic compression deformities at the lower thoracic spine, grossly unchanged. Diffuse interstitial opacities likely reflect chronic change. Left mid lung zone airspace opacity. Tiny bilateral pleural effusions or CP angle thickening. Stable cardiomediastinal silhouette. No pneumothorax. IMPRESSION: 1. Oval opacity left mid lung zone could relate to an area of  inflammatory infiltrate. 2. Diffuse interstitial opacities, likely reflecting chronic change. Tiny bilateral effusions or pleural thickening. 3. Known metastatic pulmonary nodules are better appreciated on recent PET-CT. 4. Moderate severe compression deformities of the lower thoracic spine, grossly unchanged. Electronically Signed   By: Donavan Foil M.D.   On: 05/22/2016 18:49    Cardiac Studies   2D ECHO pending.   Patient Profile   Lisa [name] is a 75 y.o. female with a history of HTN, breast cancer with Mets (pelvis, spine, previous pleural involvement s/p surgical thoracentesis (2010)) currently receiving chemotherapy with Taxol and has previously undergone radiation to the low thoracic spine for metastasis that was threatening spinal compression who was sent to [facility] ER from [place] [facility] office on 05/22/16 for some chest discomfort and SOB.  She was found to be in afib with RVR and cardiology consulted.   Assessment & Plan    New onset afib with RVR: unclear duration. She believes her symptoms began as much as 2 weeks ago.  -- Started on IV dilt with minimal improvement. Lopressor 25mg  TID added and now rate well controlled. Will stop IV cardizem and up titrate BB to 50mg  BID. 2D ECHO pending. TSH normal. Started on Eliquis (first dose at 3:50am last night) with plans for TEE/DCCV at [facility] tomorrow after at least 3 doses. CHADSVASC of at least 3 (HTN, age, F sex). However, endoscopy is booked for TEE/DCCV tomorrow and first available is Monday. They will call me if there are any cancellations.   HTN: BP was soft early this AM. Now better.     Signed, Angelena Form, PA-C  05/23/2016, 9:00 AM  As above; pt seen and examined; complains of DOE; no chest pain; rate is controlled. Continue present medications. Apixaban added. Plan for TEE guided cardioversion Monday morning.  Kirk Ruths, MD

## 2016-05-23 NOTE — ED Notes (Signed)
Patient is resting comfortably. 

## 2016-05-23 NOTE — Progress Notes (Signed)
Medical Oncology May 23, 2016, 8:40 AM  Patient seen in [facility] ED Antibiotics: none Chemotherapy: day 8 cycle 1 weekly taxol 05-16-16.  Zometa last 04-01-16  Planned day 15 cycle 1 05-23-16  HELD due to new atrial fibrillation.   Outpatient physicians:  J.Kinard, Philmore Pali (PCP), (R.Sharma), A.Kendall. K.Zebulon radiation oncology letting me know of transfer to ED on 05-22-16 due to new/ recent onset atrial fibrillation with rapid ventricular response.  Patient seen in ED now, bedside echocardiogram in process.  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   Subjective: Present symptoms of extreme fatigue on exertion began ~ 3 days after first taxol (~ 10-21 or 05-12-16), somewhat variable. She was not aware of palpitations, had some central chest discomfort without frank pain. BP and HR at [facility] 05-09-16 154/84 and 78, and on 10-26  150/75 and 90. In Radiation Oncology on 05-22-16  BP 132/83 and HR 140.  She denies SOB supine in ED on RA now. No bleeding. No pain including back or right hip area in this position. Hungry as has been NPO overnight, no GERD, no N/V. No abdominal pain. Bowels ok. No HA or other neurologic symptoms obvious. Peripheral IV access ok at present. No LE swelling.  Remainder of 10 point Review of Systems negative / unchanged.    ONCOLOGIC HISTORY The right breast carcinoma initially was T2 N1, ER/PR-positive in December 1998, treated with mastectomy with 12 node axillary evaluation and adjuvant Adriamycin/Cytoxan followed by 5 years of tamoxifen through December 2003. 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 CA 2729 was 434 in April  2010. She has tolerated Femara well and clinically has continued to do very well, though CA 27.29 has never normalized. 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 Patient seen by medical oncology 08-10-15 and radiation begun shortly afterwards by Dr Sondra Come, 22.5 of planned 35 Gy radiation to area of T10 thru 08-29-15. She had first zometa 08-11-15. MRI T spine obtained after some difficulty on 08-29-15, with pathologic compression fracture T10 with retropulsion of compressed vertebra to right with compression of cord called, involvement also T9, T11, L2. She had thoracic six- lumbar one posterolateral arthrodesis, T10 laminectomy, partial laminectomy T9, T11 by Dr Christella Noa on 09-01-15. She was begun on Aromasin on 09-07-15.  PET had been scheduled for 09-04-15, delayed with surgery, accomplished on 09-20-15 with chest adenopathy, increased soft tissue right lung base, extensive bone involvement.  Repeat PET 12-22-15 some improvement in area of radiation T spine, otherwise no improvement in bones, some increase right femur, some central chest adenopathy and right lower chest/ pleural involvement. Aromasin was discontinued and xeloda begun 01-05-16, used thru 04-29-16.  BRCA negative by [facility] 28 gene panel by [facility] 03-2016. Xeloda given 12-2015 thru 04-29-16, progressed in lung and bones by PET 05-02-16. Weekly taxol began 05-09-16. Radiation to right pelvis / hip begun ~ 05-21-16.      Objective: Vital signs in last 24 hours: Blood pressure 117/78, pulse 105, temperature 98.4 F (36.9 C), temperature source Oral, resp. rate 21, height 5' 2"  (1.575 m), weight 161 lb 8 oz (73.3 kg), SpO2 96 %. Awake, alert, fully  oriented and appropriate, detailed historian as always. Changes position on stretcher without assistance. HEENT unchanged. No JVD supine. Peripheral IV site ok. Lungs no wheezes or rales, decreased BS right base as baseline. Heart irregularly  irregular RR, clear heart sounds. Abdomen soft, not tender, quiet, no palpable HSM. UE/ LE no edema, cords, tenderness, feet warm. Speech fluent, no focal neuro deficits.    Intake/Output from previous day: 11/01 0701 - 11/02 0700 In: 100 [I.V.:100] Out: -  Intake/Output this shift: No intake/output data recorded.    Lab Results:  Recent Labs  05/22/16 1734 05/23/16 0304  WBC 2.2* 2.9*  HGB 11.6* 11.4*  HCT 33.8* 33.6*  PLT 251 291   BMET  Recent Labs  05/22/16 1734 05/23/16 0304  NA 138 140  K 3.1* 3.5  CL 107 112*  CO2 25 21*  GLUCOSE 103* 129*  BUN 24* 21*  CREATININE 1.37* 1.06*  CALCIUM 9.9 9.2    Studies/Results: Dg Chest 2 View  Result Date: 05/22/2016 CLINICAL DATA:  Recheck heart rate, irregular pulse EXAM: CHEST  2 VIEW COMPARISON:  05/02/2016, 08/02/2015 FINDINGS: Postsurgical changes of the right breast/axilla. Posterior stabilization rod and fixating screws within the spine. Multiple thoracic compression deformities at the lower thoracic spine, grossly unchanged. Diffuse interstitial opacities likely reflect chronic change. Left mid lung zone airspace opacity. Tiny bilateral pleural effusions or CP angle thickening. Stable cardiomediastinal silhouette. No pneumothorax. IMPRESSION: 1. Oval opacity left mid lung zone could relate to an area of inflammatory infiltrate. 2. Diffuse interstitial opacities, likely reflecting chronic change. Tiny bilateral effusions or pleural thickening. 3. Known metastatic pulmonary nodules are better appreciated on recent PET-CT. 4. Moderate severe compression deformities of the lower thoracic spine, grossly unchanged. Electronically Signed   By: Donavan Foil M.D.   On: 05/22/2016 18:49   DISCUSSION  Will get CT head with and without contrast per radiology protocol due to widely metastatic breast cancer and need for anticoagulation.   Hold chemo today  Assessment/Plan: 1. New atrial fibrillation with rapid ventricular  response: may have been intermittent in last 2 weeks, as complaints were somewhat out of proportion even to known extensive metastatic disease and the new chemo. With anticipated anticoagulation and extensive metastatic breast cancer, I have ordered CT head to be sure no obvious brain mets. 2. progressive metastatic breast cancer lungs and extensive bone involvement by PET 05-02-16: treatment changed to weekly taxol beginning 05-09-16. ANC 1.4 10-26, taxol dose decreased to 60 mg/m2 and added granix 300 mcg 05-17-16.  Palliative radiation to right iliac area begun 10-31, planned ~ 10 treatments. Hold day 15 cycle 1 taxol today.  Prior to present taxol, no significant improvement on Aromasin x 4 months thru  01-02-16. Oral xeloda  12-2015 thru 04-29-16, progressed.  Last zometa 04-01-16, held with increased creatinine for now. .Pathologic compression fracture T10 and retropulsion of bone impinging on spinal cord for which she had T6 - L1 posterolateral arthrodesis with T10 laminectomy and T9/ T11 partial laminectomy by Dr Christella Noa on 2-10-1. Previous radiation to right hip and right femur helpful. 3. Several recent UTIs, culture negative 04-29-16 4. Previous hypercalcemia: follow, no IV bisphosphonate since 04-01-16 due to more elevated creatinine  5.Cozaar held since 6-14-17due to potential interaction with xeloda. 6.diarrhea Jan treated for C diff ( Ab + toxin -) and resolved. 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  9.Advance Directives in place 10. Hopefully will allow flu vaccine  I will follow with you. Please page me if I can help between my rounds. Pager [facility] Evlyn Clines

## 2016-05-23 NOTE — ED Notes (Signed)
Report given to Floor Rn 

## 2016-05-23 NOTE — Progress Notes (Signed)
Pt calling out "I can't breath" Sitting on side of bed with noted increased work of breathing. O2 at @ 2l [place] with O2 sats 93-94%. BP 135/82. Tele A Fib rates 95-110. Lungs noted to have crackles. MD paged.

## 2016-05-23 NOTE — ED Notes (Signed)
Provided ice chips per orders.

## 2016-05-23 NOTE — ED Notes (Signed)
Assisted patient to restroom and back to stretcher. Pt ambulated well with walker but still gets short of breath with exertion.

## 2016-05-23 NOTE — ED Notes (Signed)
Gave report to [employer].

## 2016-05-23 NOTE — ED Notes (Signed)
Waiting on the patient to return to room so I can collect lab

## 2016-05-23 NOTE — ED Notes (Signed)
Hr has been 78-89 consistently.

## 2016-05-23 NOTE — Progress Notes (Signed)
PROGRESS NOTE [facility]   [name]   [id number] DOB: Oct 18, 1940  DOA: 05/22/2016 PCP: [name], MD   Brief Narrative:  Lisa [name] is a 75 y.o. 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 difficile colitis, CKD-2, who presents with irregular heartbeat.  Pt states that she was in [place] for radiation oncology clinic today. She had mild chest discomfort and found to have irregularly irregular heartbeat by Dr. Sondra Come, therefore she was sent to ED for further evaluation and treatment. She was found to have new onset A fib with RVR in ED. She states that her symptoms may have started 2 weeks ago. Currently patient denies any chest pain. She has mild exertional shortness of breath and mild cough, but no fever or chills. No tenderness over calf areas. No hx of A fib in the past. Patient does not have nausea, vomiting, abdominal pain, diarrhea, symptoms of UTI or unilateral weakness.  Subjective: Continues to feel short of breath seemed with cardiology at bedside. Heart rate better controlled. Denies chest pain palpitations and dizziness. Still in the ED  Assessment & Plan:   New onset Atrial fibrillation with RVR ([place]): heart rate has improved off Cardizem drip, on metoprolol and eliquis. -->likelyTEE-guided cardioversion on Monday. CHA2DS2-VASc Score is 3, TNIs negative, TSH normal  -Can transfer to telemetry floor  -Appreciate cardiology's consultation and recommendations. -Titrate Metoprolol as tolerated  -Continue Eliquis 5 mg bid - 2-D echo pending  -keep Mg >2.0  HLD: LDL 54 -Continue home medications: Zocor  HTN: Blood pressure 128/80. No on med at  home -Now on Metoprolol -Monitor BP   Hypokalemia: Resolved - Monitor BMP  - Check Mg   AoCKD-II: Baseline Cre is 140-1.1, pt's Cre Improved now 1.06 Likely due to prerenal secondary to dehydration. - Follow up renal function by BMP -Gentle hydration  Breast cancer metastasized to multiple sites [facility]): pt is followed up and treated by oncologist, Dr. Marko Plume and radiation oncologist, Dr. Sondra Come. Currently on chemo and XRT. -f/u with Dr. Marko Plume and Dr. Sondra Come   DVT prophylaxis: Eliquis Code Status: DNR  Family Communication: None at bedside Disposition Plan: Pending TEE w cardioversion, on Monday.    Consultants:   Cardiology   Procedures:   ECHO  Antimicrobials:  None    Objective: Vitals:   05/23/16 0530 05/23/16 0700 05/23/16 0730 05/23/16 0804  BP: 96/60 94/63 103/70 117/78  Pulse: 76  (!) 57 105  Resp: (!) 31 (!) 31 21   Temp:      TempSrc:      SpO2: 95% 96% 96%   Weight:      Height:        Intake/Output Summary (Last 24 hours) at 05/23/16 1139 Last data filed at 05/23/16 1010  Gross per 24 hour  Intake          1154.42 ml  Output                0 ml  Net          1154.42 ml   Filed Weights   05/22/16 1716  Weight: 73.3 kg (161 lb 8 oz)    Examination:  General exam: Appears calm and comfortable  HEENT: AC/AT, PERRLA, OP moist and clear Respiratory system: Clear  to auscultation. No wheezes,crackle or rhonchi Cardiovascular system: S1 & S2 heard, Irr Irr . No JVD, murmurs, rubs or gallops Gastrointestinal system: Abdomen is nondistended, soft and nontender. Central nervous system: Alert and oriented. No focal neurological deficits. Extremities: No pedal edema. Symmetric, strength 5/5   Skin: No rashes, lesions or ulcers Psychiatry: Judgement and insight appear normal. Mood & affect appropriate.    Data Reviewed: I have personally reviewed following labs and imaging studies  CBC:  Recent Labs Lab 05/22/16 1734 05/23/16 0304    WBC 2.2* 2.9*  NEUTROABS 1.0*  --   HGB 11.6* 11.4*  HCT 33.8* 33.6*  MCV 91.4 94.4  PLT 251 [id number]   Basic Metabolic Panel:  Recent Labs Lab 05/22/16 1734 05/22/16 2138 05/23/16 0304  NA 138  --  140  K 3.1*  --  3.5  CL 107  --  112*  CO2 25  --  21*  GLUCOSE 103*  --  129*  BUN 24*  --  21*  CREATININE 1.37*  --  1.06*  CALCIUM 9.9  --  9.2  MG  --  2.0  --    GFR: Estimated Creatinine Clearance: 45 mL/min (by C-G formula based on SCr of 1.06 mg/dL (H)). Liver Function Tests: No results for input(s): AST, ALT, ALKPHOS, BILITOT, PROT, ALBUMIN in the last 168 hours. No results for input(s): LIPASE, AMYLASE in the last 168 hours. No results for input(s): AMMONIA in the last 168 hours. Coagulation Profile: No results for input(s): INR, PROTIME in the last 168 hours. Cardiac Enzymes:  Recent Labs Lab 05/22/16 2138 05/23/16 0304 05/23/16 0952  TROPONINI <0.03 <0.03 <0.03   BNP (last 3 results) No results for input(s): PROBNP in the last 8760 hours. HbA1C: No results for input(s): HGBA1C in the last 72 hours. CBG: No results for input(s): GLUCAP in the last 168 hours. Lipid Profile:  Recent Labs  05/23/16 0304  CHOL 134  HDL 31*  LDLCALC 54  TRIG 247*  CHOLHDL 4.3   Thyroid Function Tests:  Recent Labs  05/22/16 1741 05/23/16 0304  TSH 1.856  --   FREET4  --  1.04   Anemia Panel: No results for input(s): VITAMINB12, FOLATE, FERRITIN, TIBC, IRON, RETICCTPCT in the last 72 hours. Sepsis Labs: No results for input(s): PROCALCITON, LATICACIDVEN in the last 168 hours.  No results found for this or any previous visit (from the past 240 hour(s)).    Radiology Studies: Dg Chest 2 View  Result Date: 05/22/2016 CLINICAL DATA:  Recheck heart rate, irregular pulse EXAM: CHEST  2 VIEW COMPARISON:  05/02/2016, 08/02/2015 FINDINGS: Postsurgical changes of the right breast/axilla. Posterior stabilization rod and fixating screws within the spine. Multiple  thoracic compression deformities at the lower thoracic spine, grossly unchanged. Diffuse interstitial opacities likely reflect chronic change. Left mid lung zone airspace opacity. Tiny bilateral pleural effusions or CP angle thickening. Stable cardiomediastinal silhouette. No pneumothorax. IMPRESSION: 1. Oval opacity left mid lung zone could relate to an area of inflammatory infiltrate. 2. Diffuse interstitial opacities, likely reflecting chronic change. Tiny bilateral effusions or pleural thickening. 3. Known metastatic pulmonary nodules are better appreciated on recent PET-CT. 4. Moderate severe compression deformities of the lower thoracic spine, grossly unchanged. Electronically Signed   By: Donavan Foil M.D.   On: 05/22/2016 18:49   Ct Head W & Wo Contrast  Result Date: 05/23/2016 CLINICAL DATA:  75 year old hypertensive female with metastatic breast cancer with ongoing chemotherapy. Headaches. Initial encounter. EXAM: CT HEAD WITHOUT AND  WITH CONTRAST TECHNIQUE: Contiguous axial images were obtained from the base of the skull through the vertex without and with intravenous contrast CONTRAST:  62mL ISOVUE-300 IOPAMIDOL (ISOVUE-300) INJECTION 61% COMPARISON:  No comparison head CT or brain MR. PET CT 05/02/2006. FINDINGS: Brain: No parenchymal enhancing lesion. No CT evidence of large acute infarct. No intracranial hemorrhage. Global atrophy without hydrocephalus. Vascular: Vascular calcifications. Skull: 1 cm left frontal calvarial lucency may represent metastatic disease. Slightly sclerotic appearance of the right C1 ring may represent metastatic disease. Sinuses/Orbits: No acute orbital abnormality. Visualized sinuses and mastoid air cells/ middle ear cavities are clear. Other: Negative. IMPRESSION: No parenchymal enhancing lesion. 1 cm left frontal calvarial lucency may represent metastatic disease. Slightly sclerotic appearance of the right C1 ring may represent metastatic disease. No CT evidence of  large acute infarct. Global atrophy. Electronically Signed   By: Genia Del M.D.   On: 05/23/2016 09:58     Scheduled Meds: . apixaban  5 mg Oral BID  . loratadine  10 mg Oral Daily  . metoprolol tartrate  50 mg Oral BID  . simvastatin  20 mg Oral QHS  . vitamin B-12  1,000 mcg Oral BID  . vitamin C  500 mg Oral Daily  . vitamin E  200 Units Oral Daily   Continuous Infusions: . sodium chloride 75 mL/hr at 05/22/16 2250     LOS: 1 day    Chipper Oman, MD [facility] Pager [phone]  If 7PM-7AM, please contact night-coverage [email or web address] Password [name] 05/23/2016, 11:39 AM

## 2016-05-23 NOTE — Progress Notes (Signed)
MD in to assess Pt. IV lasix 40 MG given per orders. Explanation given to Pt and instructed to call for help to [facility]

## 2016-05-24 ENCOUNTER — Inpatient Hospital Stay (HOSPITAL_COMMUNITY): Payer: Medicare Other

## 2016-05-24 ENCOUNTER — Telehealth: Payer: Self-pay | Admitting: *Deleted

## 2016-05-24 ENCOUNTER — Telehealth: Payer: Self-pay | Admitting: Oncology

## 2016-05-24 ENCOUNTER — Ambulatory Visit: Payer: Medicare Other

## 2016-05-24 DIAGNOSIS — R0602 Shortness of breath: Secondary | ICD-10-CM

## 2016-05-24 LAB — CBC WITH DIFFERENTIAL/PLATELET
Basophils Absolute: 0 10*3/uL (ref 0.0–0.1)
Basophils Relative: 0 %
EOS PCT: 0 %
Eosinophils Absolute: 0 10*3/uL (ref 0.0–0.7)
HCT: 35.1 % — ABNORMAL LOW (ref 36.0–46.0)
Hemoglobin: 11.7 g/dL — ABNORMAL LOW (ref 12.0–15.0)
LYMPHS ABS: 0.8 10*3/uL (ref 0.7–4.0)
LYMPHS PCT: 25 %
MCH: 32.1 pg (ref 26.0–34.0)
MCHC: 33.3 g/dL (ref 30.0–36.0)
MCV: 96.2 fL (ref 78.0–100.0)
MONO ABS: 0.6 10*3/uL (ref 0.1–1.0)
Monocytes Relative: 18 %
Neutro Abs: 1.9 10*3/uL (ref 1.7–7.7)
Neutrophils Relative %: 57 %
PLATELETS: 274 10*3/uL (ref 150–400)
RBC: 3.65 MIL/uL — ABNORMAL LOW (ref 3.87–5.11)
RDW: 15.2 % (ref 11.5–15.5)
WBC: 3.3 10*3/uL — ABNORMAL LOW (ref 4.0–10.5)

## 2016-05-24 LAB — BASIC METABOLIC PANEL
Anion gap: 7 (ref 5–15)
BUN: 23 mg/dL — AB (ref 6–20)
CHLORIDE: 110 mmol/L (ref 101–111)
CO2: 23 mmol/L (ref 22–32)
CREATININE: 1.09 mg/dL — AB (ref 0.44–1.00)
Calcium: 9.3 mg/dL (ref 8.9–10.3)
GFR calc Af Amer: 57 mL/min — ABNORMAL LOW (ref >60)
GFR, EST NON AFRICAN AMERICAN: 49 mL/min — AB (ref >60)
GLUCOSE: 126 mg/dL — AB (ref 65–99)
POTASSIUM: 3.8 mmol/L (ref 3.5–5.1)
Sodium: 140 mmol/L (ref 135–145)

## 2016-05-24 LAB — MAGNESIUM: Magnesium: 2 mg/dL (ref 1.7–2.4)

## 2016-05-24 LAB — HEMOGLOBIN A1C
Hgb A1c MFr Bld: 5.6 % (ref 4.8–5.6)
Mean Plasma Glucose: 114 mg/dL

## 2016-05-24 LAB — T3, FREE: T3, Free: 2.8 pg/mL (ref 2.0–4.4)

## 2016-05-24 MED ORDER — FUROSEMIDE 10 MG/ML IJ SOLN
40.0000 mg | Freq: Once | INTRAMUSCULAR | Status: AC
  Administered 2016-05-24: 40 mg via INTRAVENOUS
  Filled 2016-05-24: qty 4

## 2016-05-24 MED ORDER — FUROSEMIDE 10 MG/ML IJ SOLN
40.0000 mg | Freq: Two times a day (BID) | INTRAMUSCULAR | Status: DC
  Administered 2016-05-24 – 2016-05-25 (×2): 40 mg via INTRAVENOUS
  Filled 2016-05-24 (×2): qty 4

## 2016-05-24 MED ORDER — METOPROLOL TARTRATE 50 MG PO TABS
75.0000 mg | ORAL_TABLET | Freq: Two times a day (BID) | ORAL | Status: DC
  Administered 2016-05-24 – 2016-05-27 (×8): 75 mg via ORAL
  Filled 2016-05-24 (×8): qty 1

## 2016-05-24 MED ORDER — LEVALBUTEROL HCL 1.25 MG/0.5ML IN NEBU
1.2500 mg | INHALATION_SOLUTION | Freq: Four times a day (QID) | RESPIRATORY_TRACT | Status: DC | PRN
Start: 2016-05-24 — End: 2016-05-28
  Administered 2016-05-25 – 2016-05-28 (×3): 1.25 mg via RESPIRATORY_TRACT
  Filled 2016-05-24 (×4): qty 0.5

## 2016-05-24 NOTE — Progress Notes (Signed)
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 SOB better after IV lasix but not back to baseline. No chest pain.   Inpatient Medications    Scheduled Meds: . apixaban  5 mg Oral BID  . loratadine  10 mg Oral Daily  . metoprolol tartrate  50 mg Oral BID  . simvastatin  20 mg Oral QHS  . vitamin B-12  1,000 mcg Oral BID  . vitamin C  500 mg Oral Daily  . vitamin E  200 Units Oral Daily   Continuous Infusions: . sodium chloride     PRN Meds: ALPRAZolam, ondansetron, traMADol, zolpidem   Vital Signs    Vitals:   05/23/16 0804 05/23/16 1551 05/23/16 2107 05/24/16 0605  BP: 117/78 134/89 127/82 119/70  Pulse: 105 (!) 102 94 88  Resp:  (!) 24 20 20   Temp:  98 F (36.7 C) 97.6 F (36.4 C) 97.9 F (36.6 C)  TempSrc:  Oral Oral Oral  SpO2:  94% 100% 100%  Weight:      Height:        Intake/Output Summary (Last 24 hours) at 05/24/16 0743 Last data filed at 05/24/16 0600  Gross per 24 hour  Intake          2174.42 ml  Output             1900 ml  Net           274.42 ml   Filed Weights   05/22/16 1716  Weight: 161 lb 8 oz (73.3 kg)    Physical Exam   GEN: Well nourished, well developed, in no acute distress.  HEENT: Grossly normal.  Neck: Supple, no JVD, carotid bruits, or masses. Cardiac: irreg irreg, no murmurs, rubs, or gallops. No clubbing, cyanosis, trace bilateral edema.  Radials/DP/PT 2+ and equal bilaterally.  Respiratory:  Respirations regular and unlabored, mild crackles at bases GI: Soft, nontender, nondistended, BS + x 4. MS: no  deformity or atrophy. Skin: warm and dry, no rash. Neuro:  Strength and sensation are intact. Psych: AAOx3.  Normal affect.  Labs    CBC  Recent Labs  05/22/16 1734 05/23/16 0304 05/24/16 0452  WBC 2.2* 2.9* 3.3*  NEUTROABS 1.0*  --  1.9  HGB 11.6* 11.4* 11.7*  HCT 33.8* 33.6* 35.1*  MCV 91.4 94.4 96.2  PLT 251 291 [id number]   Basic Metabolic Panel  Recent Labs  05/22/16 2138 05/23/16 0304 05/24/16 0452  NA  --  140 140  K  --  3.5 3.8  CL  --  112* 110  CO2  --  21* 23  GLUCOSE  --  129* 126*  BUN  --  21* 23*  CREATININE  --  1.06* 1.09*  CALCIUM  --  9.2 9.3  MG 2.0  --  2.0   Liver Function Tests No results for input(s): AST, ALT, ALKPHOS, BILITOT, PROT, ALBUMIN in the last 72 hours. No results for input(s): LIPASE, AMYLASE in the last 72 hours. Cardiac Enzymes  Recent Labs  05/22/16 2138 05/23/16  0304 05/23/16 0952  TROPONINI <0.03 <0.03 <0.03   BNP Invalid input(s): POCBNP D-Dimer No results for input(s): DDIMER in the last 72 hours. Hemoglobin A1C  Recent Labs  05/23/16 0304  HGBA1C 5.6   Fasting Lipid Panel  Recent Labs  05/23/16 0304  CHOL 134  HDL 31*  LDLCALC 54  TRIG 247*  CHOLHDL 4.3   Thyroid Function Tests  Recent Labs  05/22/16 1741 05/23/16 0304  TSH 1.856  --   T3FREE  --  2.8    Telemetry    afib with HRs 90-130 - Personally Reviewed  ECG    Atrial fibrillation with rapid V-rate HR 165 - Personally Reviewed  Radiology    Dg Chest 2 View  Result Date: 05/22/2016 CLINICAL DATA:  Recheck heart rate, irregular pulse EXAM: CHEST  2 VIEW COMPARISON:  05/02/2016, 08/02/2015 FINDINGS: Postsurgical changes of the right breast/axilla. Posterior stabilization rod and fixating screws within the spine. Multiple thoracic compression deformities at the lower thoracic spine, grossly unchanged. Diffuse interstitial opacities likely reflect chronic change. Left mid lung zone airspace opacity. Tiny bilateral pleural effusions or  CP angle thickening. Stable cardiomediastinal silhouette. No pneumothorax. IMPRESSION: 1. Oval opacity left mid lung zone could relate to an area of inflammatory infiltrate. 2. Diffuse interstitial opacities, likely reflecting chronic change. Tiny bilateral effusions or pleural thickening. 3. Known metastatic pulmonary nodules are better appreciated on recent PET-CT. 4. Moderate severe compression deformities of the lower thoracic spine, grossly unchanged. Electronically Signed   By: Donavan Foil M.D.   On: 05/22/2016 18:49   Ct Head W & Wo Contrast  Result Date: 05/23/2016 CLINICAL DATA:  75 year old hypertensive female with metastatic breast cancer with ongoing chemotherapy. Headaches. Initial encounter. EXAM: CT HEAD WITHOUT AND WITH CONTRAST TECHNIQUE: Contiguous axial images were obtained from the base of the skull through the vertex without and with intravenous contrast CONTRAST:  43mL ISOVUE-300 IOPAMIDOL (ISOVUE-300) INJECTION 61% COMPARISON:  No comparison head CT or brain MR. PET CT 05/02/2006. FINDINGS: Brain: No parenchymal enhancing lesion. No CT evidence of large acute infarct. No intracranial hemorrhage. Global atrophy without hydrocephalus. Vascular: Vascular calcifications. Skull: 1 cm left frontal calvarial lucency may represent metastatic disease. Slightly sclerotic appearance of the right C1 ring may represent metastatic disease. Sinuses/Orbits: No acute orbital abnormality. Visualized sinuses and mastoid air cells/ middle ear cavities are clear. Other: Negative. IMPRESSION: No parenchymal enhancing lesion. 1 cm left frontal calvarial lucency may represent metastatic disease. Slightly sclerotic appearance of the right C1 ring may represent metastatic disease. No CT evidence of large acute infarct. Global atrophy. Electronically Signed   By: Genia Del M.D.   On: 05/23/2016 09:58    Cardiac Studies   2D ECHO: 05/23/2016 LV EF: 55% -   60% Study Conclusions - Left ventricle: The  cavity size was normal. Wall thickness was   normal. Systolic function was normal. The estimated ejection   fraction was in the range of 55% to 60%. Wall motion was normal;   there were no regional wall motion abnormalities. - Aortic valve: There was trivial regurgitation. - Mitral valve: Calcified annulus. Systolic bowing without   prolapse. There was moderate regurgitation directed centrally. - Left atrium: The atrium was mildly dilated. - Right atrium: The atrium was mildly dilated. - Atrial septum: No defect or patent foramen ovale was identified. - Pulmonary arteries: Systolic pressure was mildly to moderately   increased. PA peak pressure: 49 mm Hg (S).   Patient Profile   [name]  is a 75 y.o. female with a history of HTN, breast cancer with Mets (pelvis, spine, previous pleural involvement s/p surgical thoracentesis (2010)) currently receiving chemotherapy with Taxol and has previously undergone radiation to the low thoracic spine for metastasis that was threatening spinal compression who was sent to [facility] ER from [place] office on 05/22/16 for some chest discomfort and SOB.  She was found to be in afib with RVR and cardiology consulted.   Assessment & Plan    New onset afib with RVR: unclear duration. She believes her symptoms began as long as 2 weeks ago.  -- Currently on Lopressor 50mg  BID and rates with moderate control (HR 90-130). Will increase to Lopressor 75mg  BID for better rate control.  -- 2D ECHO with normal LV function, no WMAs, mod MR, PA pressure 49. TSH normal.  -- Started on Eliquis 5mg  BID with plans for TEE/DCCV at [facility] on Monday (first available). CHADSVASC of at least 3 (HTN, age, F sex).  HTN: BP well controlled currently.   Acute diastolic CHF: likely related to afib and IVFs. Patient given IV lasix 40mg  x1. Still has some volume on board will give another dose of IV lasix 40mg    Signed, Angelena Form, PA-C  05/24/2016, 7:43 AM  As above,  patient seen and examined. She has some dyspnea but denies chest pain. She remains in atrial fibrillation.  1 paroxysmal atrial fibrillation-rate is elevated. Increase metoprolol to 75 mg twice a day. Continue apxiaban. Plan TEE guided cardioversion on Monday.  2 Acute diastolic congestive heart failure-likely exacerbated by atrial fibrillation. Agree with lasix. Follow renal function.  3 Metastatic breast cancer  Kirk Ruths, MD

## 2016-05-24 NOTE — Discharge Instructions (Addendum)
TEE ° °YOU HAD AN CARDIAC PROCEDURE TODAY: Refer to the procedure report and other information in the discharge instructions given to you for any specific questions about what was found during the examination. If this information does not answer your questions, please call [facility] office at [phone] to clarify.  ° °DIET: Your first meal following the procedure should be a light meal and then it is ok to progress to your normal diet. A half-sandwich or bowl of soup is an example of a good first meal. Heavy or fried foods are harder to digest and may make you feel nauseous or bloated. Drink plenty of fluids but you should avoid alcoholic beverages for 24 hours. If you had a esophageal dilation, please see attached instructions for diet.  ° °ACTIVITY: Your care partner should take you home directly after the procedure. You should plan to take it easy, moving slowly for the rest of the day. You can resume normal activity the day after the procedure however YOU SHOULD NOT DRIVE, use power tools, machinery or perform tasks that involve climbing or major physical exertion for 24 hours (because of the sedation medicines used during the test).  ° °SYMPTOMS TO REPORT IMMEDIATELY: °A cardiologist can be reached at any hour. Please call [phone] for any of the following symptoms:  °Vomiting of blood or coffee ground material  °New, significant abdominal pain  °New, significant chest pain or pain under the shoulder blades  °Painful or persistently difficult swallowing  °New shortness of breath  °Black, tarry-looking or red, bloody stools ° °FOLLOW UP:  °Please also call with any specific questions about appointments or follow up tests. ° °Electrical Cardioversion, Care After °Refer to this sheet in the next few weeks. These instructions provide you with information on caring for yourself after your procedure. Your health care provider may also give you more specific instructions. Your treatment has been planned  according to current medical practices, but problems sometimes occur. Call your health care provider if you have any problems or questions after your procedure. °WHAT TO EXPECT AFTER THE PROCEDURE °After your procedure, it is typical to have the following sensations: °· Some redness on the skin where the shocks were delivered. If this is tender, a sunburn lotion or hydrocortisone cream may help. °· Possible return of an abnormal heart rhythm within hours or days after the procedure. °HOME CARE INSTRUCTIONS °· Take medicines only as directed by your health care provider. Be sure you understand how and when to take your medicine. °· Learn how to feel your pulse and check it often. °· Limit your activity for 48 hours after the procedure or as directed by your health care provider. °· Avoid or minimize caffeine and other stimulants as directed by your health care provider. °SEEK MEDICAL CARE IF: °· You feel like your heart is beating too fast or your pulse is not regular. °· You have any questions about your medicines. °· You have bleeding that will not stop. °SEEK IMMEDIATE MEDICAL CARE IF: °· You are dizzy or feel faint. °· It is hard to breathe or you feel short of breath. °· There is a change in discomfort in your chest. °· Your speech is slurred or you have trouble moving an arm or leg on one side of your body. °· You get a serious muscle cramp that does not go away. °· Your fingers or toes turn cold or blue. °  °This information is not intended to replace advice given to you by   your health care provider. Make sure you discuss any questions you have with your health care provider.   Document Released: 04/28/2013 Document Revised: 07/29/2014 Document Reviewed: 04/28/2013 Elsevier Interactive Patient Education 2016 [place] on my medicine - ELIQUIS (apixaban)  This medication education was reviewed with me or my healthcare representative as part of my discharge preparation.  The pharmacist  that spoke with me during my hospital stay was:  Sallyanne Havers, Student-PharmD  Why was Eliquis prescribed for you? Eliquis was prescribed for you to reduce the risk of a blood clot forming that can cause a stroke if you have a medical condition called atrial fibrillation (a type of irregular heartbeat).  What do You need to know about Eliquis ? Take your Eliquis TWICE DAILY - one tablet in the morning and one tablet in the evening with or without food. If you have difficulty swallowing the tablet whole please discuss with your pharmacist how to take the medication safely.  Take Eliquis exactly as prescribed by your doctor and DO NOT stop taking Eliquis without talking to the doctor who prescribed the medication.  Stopping may increase your risk of developing a stroke.  Refill your prescription before you run out.  After discharge, you should have regular check-up appointments with your healthcare provider that is prescribing your Eliquis.  In the future your dose may need to be changed if your kidney function or weight changes by a significant amount or as you get older.  What do you do if you miss a dose? If you miss a dose, take it as soon as you remember on the same day and resume taking twice daily.  Do not take more than one dose of ELIQUIS at the same time to make up a missed dose.  Important Safety Information A possible side effect of Eliquis is bleeding. You should call your healthcare provider right away if you experience any of the following: ? Bleeding from an injury or your nose that does not stop. ? Unusual colored urine (red or dark brown) or unusual colored stools (red or black). ? Unusual bruising for unknown reasons. ? A serious fall or if you hit your head (even if there is no bleeding).  Some medicines may interact with Eliquis and might increase your risk of bleeding or clotting while on Eliquis. To help avoid this, consult your healthcare provider or pharmacist  prior to using any new prescription or non-prescription medications, including herbals, vitamins, non-steroidal anti-inflammatory drugs (NSAIDs) and supplements.  This website has more information on Eliquis (apixaban): http://www.eliquis.com/eliquis/home

## 2016-05-24 NOTE — Telephone Encounter (Signed)
Pt left VM to cancel her injection appt today as she is in the hospital.  Notified Injection nurse and canceled appt.[name]

## 2016-05-24 NOTE — Telephone Encounter (Signed)
Anise Salvo, RN on [address] to see if Samaiyah would like radiation today.  Lisa [name] does not want treatment today because her heart rate goes up to 145 when she stands up and is short of breath.  She will also have a TEE on Monday around 1 pm so we will check back on Monday about treatment.  Notified Merrilee Seashore, RTT on [place] [address].

## 2016-05-24 NOTE — Evaluation (Signed)
Occupational Therapy Evaluation Patient Details Name: Lisa [name] MRN: [id number] DOB: [birth year] Today's Date: 05/24/2016    History of Present Illness This 75 y.o. female admitted from radiation/oncology office for irregular heartbeat and chest discomfort.  She was found to have A-Fib with RVR.  PMH includes metastatic breast CA with mets to pelvis and spine - she is currently taking chemo and receiving radiation; HTN, h/o C-Diff, CKD-2.     Clinical Impression   Pt admitted with above. She demonstrates the below listed deficits and will benefit from continued OT to maximize safety and independence with BADLs.  Pt currently requires max A for ADLs and min guard assist - supervision for functional transfers.  Pt with poor activity tolerance due to HR elevates with only minimal activity.  Resting HR low 100's - 120.   HR immediately increased to high 120s - 144 with standing EOB.  02 sats 100% on 3L supplemental 02.  Pt returned to supine and RN notified.  She is scheduled for cardioversion on Monday.  Will check back after cardioversion.  Anticipate she will be able to discharge home.       Follow Up Recommendations  No OT follow up;Supervision - Intermittent    Equipment Recommendations  None recommended by OT    Recommendations for Other Services       Precautions / Restrictions Precautions Precautions: Fall Restrictions Weight Bearing Restrictions: No      Mobility Bed Mobility Overal bed mobility: Modified Independent                Transfers Overall transfer level: Needs assistance Equipment used: None Transfers: Sit to/from Stand;Stand Pivot Transfers Sit to Stand: Supervision Stand pivot transfers: Min guard            Balance                                            ADL Overall ADL's : Needs assistance/impaired Eating/Feeding: Independent   Grooming: Wash/dry hands;Oral care;Wash/dry face;Brushing hair;Sitting    Upper Body Bathing: Maximal assistance;Bed level   Lower Body Bathing: Maximal assistance;Bed level   Upper Body Dressing : Maximal assistance;Sitting   Lower Body Dressing: Maximal assistance;Bed level   Toilet Transfer: Stand-pivot;BSC;Supervision/safety   Toileting- Clothing Manipulation and Hygiene: Sit to/from stand;Minimal assistance       Functional mobility during ADLs: Supervision/safety;Min guard General ADL Comments: Pt limited by elevated HR with activity      Vision     Perception     Praxis      Pertinent Vitals/Pain Pain Assessment: Faces Faces Pain Scale: No hurt     Hand Dominance Right   Extremity/Trunk Assessment Upper Extremity Assessment Upper Extremity Assessment: Generalized weakness   Lower Extremity Assessment Lower Extremity Assessment: Generalized weakness   Cervical / Trunk Assessment Cervical / Trunk Assessment: Normal   Communication Communication Communication: No difficulties   Cognition Arousal/Alertness: Awake/alert Behavior During Therapy: WFL for tasks assessed/performed Overall Cognitive Status: Within Functional Limits for tasks assessed                     General Comments       Exercises       Shoulder Instructions      Home Living Family/patient expects to be discharged to:: Private residence Living Arrangements: Parent Available Help at Discharge: Family;Available PRN/intermittently Type of Home:  House Home Access: Stairs to enter [employer] of Steps: 4 Entrance Stairs-Rails: Left Home Layout: One level     Bathroom Shower/Tub: Tub/shower unit Shower/tub characteristics: Curtain Biochemist, clinical: Standard Bathroom Accessibility: No   Home Equipment: Environmental consultant - 2 wheels;Cane - single point;Shower seat   Additional Comments: mother is in her 3s       Prior Functioning/Environment Level of Independence: Independent with assistive device(s)        Comments: utilizes SPC or RW  during ambulation         OT Problem List: Decreased strength;Decreased activity tolerance;Cardiopulmonary status limiting activity   OT Treatment/Interventions: Self-care/ADL training;Therapeutic exercise;Energy conservation;DME and/or AE instruction;Therapeutic activities;Balance training;Patient/family education    OT Goals(Current goals can be found in the care plan section) Acute Rehab OT Goals Patient Stated Goal: to get this over with and get my independence back  OT Goal Formulation: With patient Time For Goal Achievement: 06/07/16 Potential to Achieve Goals: Good ADL Goals Pt Will Perform Grooming: with modified independence;standing Pt Will Perform Upper Body Bathing: with modified independence;sitting Pt Will Perform Lower Body Bathing: with modified independence;sit to/from stand Pt Will Perform Upper Body Dressing: with modified independence;sitting Pt Will Perform Lower Body Dressing: sit to/from stand Pt Will Transfer to Toilet: with modified independence;ambulating;regular height toilet;bedside commode;grab bars Pt Will Perform Toileting - Clothing Manipulation and hygiene: with modified independence;sit to/from stand Pt Will Perform Tub/Shower Transfer: Tub transfer;ambulating;shower seat;rolling walker;with min guard assist Pt/caregiver will Perform Home Exercise Program: Increased strength;Right Upper extremity;Left upper extremity;With theraband;With written HEP provided;Independently  OT Frequency: Min 2X/week   Barriers to D/C: Decreased caregiver support          Co-evaluation              End of Session Equipment Utilized During Treatment: Oxygen Nurse Communication: Other (comment) (HR )  Activity Tolerance: Treatment limited secondary to medical complications (Comment) Patient left: in bed;with call bell/phone within reach;with bed alarm set   Time: [id number] OT Time Calculation (min): 28 min Charges:  OT General Charges $OT Visit: 1  Procedure OT Evaluation $OT Eval Moderate Complexity: 1 Procedure OT Treatments $Therapeutic Activity: 8-22 mins G-Codes:    Tarra Pence M 06/23/2016, 11:04 AM

## 2016-05-24 NOTE — Progress Notes (Signed)
Medical Oncology May 24, 2016, 1:47 PM  Hospital day 2      Chemotherapy: day 8 cycle 1 weekly taxol 05-16-16. Day 15 cycle 1 HELD 05-23-16; granix planned after day 15 chemo held. Zometa last 04-01-16 for hypercalcemia and bone metastases  EMR reviewed.   Subjective: Acute SOB last pm much better with diuresis. Slept last night. Feeling some better today, still weak and "winded" when up, even to stand beside bed with OT. No chest pain, no significant cough, no pain back or RLE. Has eaten, no N/V. No bleeding on eliquis since 05-23-16.     ONCOLOGIC HISTORY Right breast carcinoma T2 N1, ER/PR-positive December 1998, treated with mastectomy with 12 node axillary evaluation and adjuvant Adriamycin/Cytoxan followed by 5 years of tamoxifen through December 2003. 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 CA 2729 was 434 in April 2010. She has tolerated Femara well and clinically has continued to do very well, though CA 27.29 has never normalized. 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 Patient seen by medical oncology 08-10-15 and radiation begun shortly afterwards by Dr Sondra Come, 22.5 of planned 35 Gy radiation to area of T10 thru 08-29-15. She had first zometa 08-11-15. MRI T spine obtained after some difficulty on 08-29-15, with pathologic compression fracture T10 with retropulsion of compressed vertebra to right with compression of cord called, involvement also T9, T11, L2. She had thoracic six- lumbar one posterolateral arthrodesis, T10 laminectomy, partial laminectomy T9, T11 by Dr Christella Noa on 09-01-15. She was begun on Aromasin on 09-07-15.  PET had been scheduled for 09-04-15, delayed with surgery, accomplished on 09-20-15 with chest adenopathy, increased soft tissue right lung base, extensive bone  involvement.  Repeat PET 12-22-15 some improvement in area of radiation T spine, otherwise no improvement in bones, some increase right femur, some central chest adenopathy and right lower chest/ pleural involvement. Aromasin was discontinued and xeloda begun 01-05-16, used thru 04-29-16.  BRCA negative by [facility] 28 gene panel by [facility] 03-2016. Progressed in lung and bones by PET 05-02-16. Weekly taxol began 05-09-16. Radiation to right pelvis / hip begun ~ 05-21-16.    Objective: Vital signs in last 24 hours: Blood pressure 121/73, pulse 83, temperature 97.9 F (36.6 C), temperature source Oral, resp. rate 20, height 5' 2"  (1.575 m), weight 161 lb 8 oz (73.3 kg), SpO2 100 %. HR to 140s when stood with OT.  Intake/Output from previous day: 11/02 0701 - 11/03 0700 In: 2174.4 [P.O.:120; I.V.:2054.4] Out: 1900 [Urine:1900] Intake/Output this shift: Total I/O In: -  Out: 400 [Urine:400]  Physical exam: Awake, alert, fully oriented,  talkative, much less dyspneic than last pm. Pale, not icteric. Oral mucosa moist and clear. No JVD supine. Peripheral IV locked left upper forearm, site ok. Heart irregularly irregular rhythm. Lungs still slight crackles, no longer wheezing. Abdomen soft, a few BS, not tender. LE no edema left, trace on right, no cords or tenderness. Moves all extremeties easily. Speech fluent and appropriate.   Lab Results:  Recent Labs  05/23/16 0304 05/24/16 0452  WBC 2.9* 3.3*  HGB 11.4* 11.7*  HCT 33.6* 35.1*  PLT 291 274   BMET  Recent Labs  05/23/16 0304 05/24/16 0452  NA 140 140  K 3.5 3.8  CL 112* 110  CO2 21* 23  GLUCOSE 129* 126*  BUN 21* 23*  CREATININE 1.06* 1.09*  CALCIUM 9.2 9.3    Studies/Results: Dg Chest 2 View  Result Date: 05/22/2016 CLINICAL DATA:  Recheck heart rate, irregular pulse EXAM: CHEST  2 VIEW COMPARISON:  05/02/2016, 08/02/2015 FINDINGS: Postsurgical changes of the right breast/axilla. Posterior stabilization rod and fixating  screws within the spine. Multiple thoracic compression deformities at the lower thoracic spine, grossly unchanged. Diffuse interstitial opacities likely reflect chronic change. Left mid lung zone airspace opacity. Tiny bilateral pleural effusions or CP angle thickening. Stable cardiomediastinal silhouette. No pneumothorax. IMPRESSION: 1. Oval opacity left mid lung zone could relate to an area of inflammatory infiltrate. 2. Diffuse interstitial opacities, likely reflecting chronic change. Tiny bilateral effusions or pleural thickening. 3. Known metastatic pulmonary nodules are better appreciated on recent PET-CT. 4. Moderate severe compression deformities of the lower thoracic spine, grossly unchanged. Electronically Signed   By: Donavan Foil M.D.   On: 05/22/2016 18:49   Ct Head W & Wo Contrast  Result Date: 05/23/2016 CLINICAL DATA:  75 year old hypertensive female with metastatic breast cancer with ongoing chemotherapy. Headaches. Initial encounter. EXAM: CT HEAD WITHOUT AND WITH CONTRAST TECHNIQUE: Contiguous axial images were obtained from the base of the skull through the vertex without and with intravenous contrast CONTRAST:  46m ISOVUE-300 IOPAMIDOL (ISOVUE-300) INJECTION 61% COMPARISON:  No comparison head CT or brain MR. PET CT 05/02/2006. FINDINGS: Brain: No parenchymal enhancing lesion. No CT evidence of large acute infarct. No intracranial hemorrhage. Global atrophy without hydrocephalus. Vascular: Vascular calcifications. Skull: 1 cm left frontal calvarial lucency may represent metastatic disease. Slightly sclerotic appearance of the right C1 ring may represent metastatic disease. Sinuses/Orbits: No acute orbital abnormality. Visualized sinuses and mastoid air cells/ middle ear cavities are clear. Other: Negative. IMPRESSION: No parenchymal enhancing lesion. 1 cm left frontal calvarial lucency may represent metastatic disease. Slightly sclerotic appearance of the right C1 ring may represent  metastatic disease. No CT evidence of large acute infarct. Global atrophy. Electronically Signed   By: SGenia DelM.D.   On: 05/23/2016 09:58   CT head information discussed with patient last pm.   DISCUSSION: [facility], with anticoagulation on Eliquis prior. She is concerned about losing strength staying in bed, but understands that she likely will improve strength and activity more quickly when cardiac issues resolved than after previous spine surgery. Hold radiation today. Hold chemo until acute cardiac problem resolved, resume outpatient then.   Assessment/Plan: 1. New atrial fibrillation with rapid ventricular response: may have been intermittent in last 2 weeks. Head CT negative for intracranial mets. [facility] 2. progressive metastatic breast cancer lungs andextensive bone involvement by PET 05-02-16: treatment changed to weekly taxol beginning 05-09-16. ANC 1.4 10-26, taxol dose decreased to 60 mg/m2 and added granix 300 mcg 05-17-16. Palliative radiation to right iliac area begun 10-31, planned ~ 10 treatments. Hold day 15 cycle 1 taxol for present acute cardiac problems, hold radiation at least until next week. Does not need granix today Prior to present taxol, no significant improvement on Aromasin x 4 months thru 01-02-16 and progression on xeloda 12-2015 thru 04-29-16. Last zometa 04-01-16, held with increased creatinine in Oct. .Pathologic compression fracture T10 and retropulsion of bone impinging on spinal cord for which she had T6 - L1 posterolateral arthrodesis with T10 laminectomy and T9/ T11 partial laminectomy by Dr CChristella Noaon 2-10-1. Previous radiation to right hip and right femur helpful. 3.  Several recent UTIs, culture negative 04-29-16 4. Previous hypercalcemia: follow, no IV bisphosphonate since 04-01-16 due to more elevated creatinine  5.needs flu vaccine, tho has refused  to date 6.diarrhea Jan treated for C diff ( Ab + toxin -) and resolved. 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  9.Advance Directives in place  Please page me [phone] or call med onc on call if needed between my rounds. Thank you  Evlyn Clines  MD

## 2016-05-24 NOTE — Progress Notes (Signed)
PT Cancellation Note  Patient Details Name: Lisa [name] MRN: [id number] DOB: Apr 07, 1941   Cancelled Treatment:    Reason Eval/Treat Not Completed: Medical issues which prohibited therapy   Weston Anna, MPT Pager: [phone]

## 2016-05-24 NOTE — Care Management Note (Signed)
Case Management Note  Patient Details  Name: Lisa [name] MRN: [id number] Date of Birth: June 22, 1941  Subjective/Objective:  75 y/o f admitted w/afib. From home. On 02-will monitor if needed @ home.Cardio following.                  Action/Plan:d/c plan home.   Expected Discharge Date:   (unknown)               Expected Discharge Plan:  Home/Self Care  In-House Referral:     Discharge planning Services  CM Consult  Post Acute Care Choice:    Choice offered to:     DME Arranged:    DME Agency:     HH Arranged:    HH Agency:     Status of Service:  In process, will continue to follow  If discussed at Long Length of Stay Meetings, dates discussed:    Additional Comments:  Dessa Phi, RN 05/24/2016, 1:26 PM

## 2016-05-24 NOTE — Progress Notes (Signed)
PROGRESS NOTE [facility]   Lisa [name]   [id number] DOB: 04/14/1941  DOA: 05/22/2016 PCP: [name], MD   Brief Narrative:  Lisa [name] is a 75 y.o. 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 difficile colitis, CKD-2, who presents with irregular heartbeat.  Pt states that she was in [place] for radiation oncology clinic today. She had mild chest discomfort and found to have irregularly irregular heartbeat by Dr. Sondra Come, therefore she was sent to ED for further evaluation and treatment. She was found to have new onset A fib with RVR in ED. She states that her symptoms may have started 2 weeks ago. Currently patient denies any chest pain. She has mild exertional shortness of breath and mild cough, but no fever or chills. No tenderness over calf areas. No hx of A fib in the past. Patient does not have nausea, vomiting, abdominal pain, diarrhea, symptoms of UTI or unilateral weakness.  Subjective: SOB worsened overnight, Lasix where given, slight improvement. Denies chest pain, palpitations and dizziness.   Assessment & Plan:   New onset Atrial fibrillation with RVR ([place]): heart rate has improved off Cardizem drip, on metoprolol and eliquis. -->likelyTEE-guided cardioversion on Monday. CHA2DS2-VASc Score is 3, TNIs negative, TSH normal, Continues in Afib -Can transfer to telemetry floor  -Appreciate cardiology's consultation and recommendations. -Titrate Metoprolol as tolerated  -Continue Eliquis 5 mg bid  SOB 2/2 Acute diastolic failure due to afib  -IV Lasix 40 mg BID  -Monitor BMP  -Repeat CXR  -Neb treatment given   HLD: LDL 54 -Continue home medications:  Zocor  HTN: Blood pressure 128/80. No on med at home -Now on Metoprolol -Monitor BP   Hypokalemia: Resolved - Monitor BMP  - Check Mg   AoCKD-II: Baseline Cre is 140-1.1, pt's Cre Improved Likely due to prerenal secondary to dehydration. -IVF discontinued due to fluid overload  -Monitor renal function - now on lasix   Breast cancer metastasized to multiple sites [facility]): pt is followed up and treated by oncologist, Dr. Marko Plume and radiation oncologist, Dr. Sondra Come. Currently on chemo and XRT. -f/u with Dr. Marko Plume and Dr. Sondra Come   DVT prophylaxis: Eliquis Code Status: DNR  Family Communication: Family at bedside, plan discussed in details.  Disposition Plan: Pending TEE w cardioversion, on Monday.    Consultants:   Cardiology   Procedures:   ECHO  Antimicrobials:  None    Objective: Vitals:   05/23/16 2107 05/24/16 0605 05/24/16 1022 05/24/16 1400  BP: 127/82 119/70 121/73 97/62  Pulse: 94 88 83 76  Resp: 20 20  20   Temp: 97.6 F (36.4 C) 97.9 F (36.6 C)  98 F (36.7 C)  TempSrc: Oral Oral  Oral  SpO2: 100% 100%  100%  Weight:      Height:        Intake/Output Summary (Last 24 hours) at 05/24/16 1517 Last data filed at 05/24/16 1500  Gross per 24 hour  Intake              360 ml  Output             2600 ml  Net            -2240 ml   Filed Weights   05/22/16 1716  Weight:  73.3 kg (161 lb 8 oz)    Examination:  General exam: Breathing heavy, difficulty to speak in complete full sentences  Respiratory system: Diffuse rales, mild wheezing at the upper bases b/l  Cardiovascular system: S1 & S2 heard, Irr Irr . No JVD, murmurs, rubs or gallops Gastrointestinal system: Abdomen is nondistended, soft and nontender. Central nervous system: Alert and oriented. No focal neurological deficits. Extremities: Trace pedal edema.    Skin: No rashes, lesions or ulcers    Data Reviewed: I have personally reviewed following labs and imaging  studies  CBC:  Recent Labs Lab 05/22/16 1734 05/23/16 0304 05/24/16 0452  WBC 2.2* 2.9* 3.3*  NEUTROABS 1.0*  --  1.9  HGB 11.6* 11.4* 11.7*  HCT 33.8* 33.6* 35.1*  MCV 91.4 94.4 96.2  PLT 251 291 [id number]   Basic Metabolic Panel:  Recent Labs Lab 05/22/16 1734 05/22/16 2138 05/23/16 0304 05/24/16 0452  NA 138  --  140 140  K 3.1*  --  3.5 3.8  CL 107  --  112* 110  CO2 25  --  21* 23  GLUCOSE 103*  --  129* 126*  BUN 24*  --  21* 23*  CREATININE 1.37*  --  1.06* 1.09*  CALCIUM 9.9  --  9.2 9.3  MG  --  2.0  --  2.0   GFR: Estimated Creatinine Clearance: 43.7 mL/min (by C-G formula based on SCr of 1.09 mg/dL (H)). Liver Function Tests: No results for input(s): AST, ALT, ALKPHOS, BILITOT, PROT, ALBUMIN in the last 168 hours. No results for input(s): LIPASE, AMYLASE in the last 168 hours. No results for input(s): AMMONIA in the last 168 hours. Coagulation Profile: No results for input(s): INR, PROTIME in the last 168 hours. Cardiac Enzymes:  Recent Labs Lab 05/22/16 2138 05/23/16 0304 05/23/16 0952  TROPONINI <0.03 <0.03 <0.03   BNP (last 3 results) No results for input(s): PROBNP in the last 8760 hours. HbA1C:  Recent Labs  05/23/16 0304  HGBA1C 5.6   CBG: No results for input(s): GLUCAP in the last 168 hours. Lipid Profile:  Recent Labs  05/23/16 0304  CHOL 134  HDL 31*  LDLCALC 54  TRIG 247*  CHOLHDL 4.3   Thyroid Function Tests:  Recent Labs  05/22/16 1741 05/23/16 0304  TSH 1.856  --   FREET4  --  1.04  T3FREE  --  2.8   Anemia Panel: No results for input(s): VITAMINB12, FOLATE, FERRITIN, TIBC, IRON, RETICCTPCT in the last 72 hours. Sepsis Labs: No results for input(s): PROCALCITON, LATICACIDVEN in the last 168 hours.  No results found for this or any previous visit (from the past 240 hour(s)).    Radiology Studies: Dg Chest 2 View  Result Date: 05/22/2016 CLINICAL DATA:  Recheck heart rate, irregular pulse EXAM: CHEST  2  VIEW COMPARISON:  05/02/2016, 08/02/2015 FINDINGS: Postsurgical changes of the right breast/axilla. Posterior stabilization rod and fixating screws within the spine. Multiple thoracic compression deformities at the lower thoracic spine, grossly unchanged. Diffuse interstitial opacities likely reflect chronic change. Left mid lung zone airspace opacity. Tiny bilateral pleural effusions or CP angle thickening. Stable cardiomediastinal silhouette. No pneumothorax. IMPRESSION: 1. Oval opacity left mid lung zone could relate to an area of inflammatory infiltrate. 2. Diffuse interstitial opacities, likely reflecting chronic change. Tiny bilateral effusions or pleural thickening. 3. Known metastatic pulmonary nodules are better appreciated on recent PET-CT. 4. Moderate severe compression deformities of the lower thoracic spine, grossly unchanged. Electronically Signed   By:  Donavan Foil M.D.   On: 05/22/2016 18:49   Ct Head W & Wo Contrast  Result Date: 05/23/2016 CLINICAL DATA:  75 year old hypertensive female with metastatic breast cancer with ongoing chemotherapy. Headaches. Initial encounter. EXAM: CT HEAD WITHOUT AND WITH CONTRAST TECHNIQUE: Contiguous axial images were obtained from the base of the skull through the vertex without and with intravenous contrast CONTRAST:  84mL ISOVUE-300 IOPAMIDOL (ISOVUE-300) INJECTION 61% COMPARISON:  No comparison head CT or brain MR. PET CT 05/02/2006. FINDINGS: Brain: No parenchymal enhancing lesion. No CT evidence of large acute infarct. No intracranial hemorrhage. Global atrophy without hydrocephalus. Vascular: Vascular calcifications. Skull: 1 cm left frontal calvarial lucency may represent metastatic disease. Slightly sclerotic appearance of the right C1 ring may represent metastatic disease. Sinuses/Orbits: No acute orbital abnormality. Visualized sinuses and mastoid air cells/ middle ear cavities are clear. Other: Negative. IMPRESSION: No parenchymal enhancing lesion.  1 cm left frontal calvarial lucency may represent metastatic disease. Slightly sclerotic appearance of the right C1 ring may represent metastatic disease. No CT evidence of large acute infarct. Global atrophy. Electronically Signed   By: Genia Del M.D.   On: 05/23/2016 09:58     Scheduled Meds: . apixaban  5 mg Oral BID  . furosemide  40 mg Intravenous BID  . loratadine  10 mg Oral Daily  . metoprolol tartrate  75 mg Oral BID  . simvastatin  20 mg Oral QHS  . vitamin B-12  1,000 mcg Oral BID  . vitamin C  500 mg Oral Daily  . vitamin E  200 Units Oral Daily   Continuous Infusions: . sodium chloride       LOS: 2 days    Chipper Oman, MD [facility] Pager [phone]  If 7PM-7AM, please contact night-coverage [email or web address] Password [email or web address] 05/24/2016, 3:17 PM

## 2016-05-25 LAB — BASIC METABOLIC PANEL
Anion gap: 8 (ref 5–15)
BUN: 27 mg/dL — ABNORMAL HIGH (ref 6–20)
CHLORIDE: 107 mmol/L (ref 101–111)
CO2: 25 mmol/L (ref 22–32)
CREATININE: 1.13 mg/dL — AB (ref 0.44–1.00)
Calcium: 9.4 mg/dL (ref 8.9–10.3)
GFR calc non Af Amer: 47 mL/min — ABNORMAL LOW (ref >60)
GFR, EST AFRICAN AMERICAN: 55 mL/min — AB (ref >60)
Glucose, Bld: 116 mg/dL — ABNORMAL HIGH (ref 65–99)
POTASSIUM: 3.6 mmol/L (ref 3.5–5.1)
Sodium: 140 mmol/L (ref 135–145)

## 2016-05-25 LAB — MAGNESIUM: MAGNESIUM: 2 mg/dL (ref 1.7–2.4)

## 2016-05-25 MED ORDER — FUROSEMIDE 10 MG/ML IJ SOLN
20.0000 mg | Freq: Two times a day (BID) | INTRAMUSCULAR | Status: DC
  Administered 2016-05-25: 20 mg via INTRAVENOUS
  Filled 2016-05-25: qty 2

## 2016-05-25 MED ORDER — FUROSEMIDE 20 MG PO TABS: 20.0000 mg | ORAL_TABLET | Freq: Two times a day (BID) | ORAL | Status: DC

## 2016-05-25 MED ORDER — AMIODARONE HCL 200 MG PO TABS
400.0000 mg | ORAL_TABLET | Freq: Two times a day (BID) | ORAL | Status: DC
  Administered 2016-05-25 – 2016-05-27 (×6): 400 mg via ORAL
  Filled 2016-05-25 (×8): qty 2

## 2016-05-25 NOTE — Evaluation (Signed)
Physical Therapy Evaluation Patient Details Name: Lisa [name] MRN: [id number] DOB: 1941-02-22 Today's Date: 05/25/2016   History of Present Illness  This 75 y.o. female admitted from radiation/oncology office for irregular heartbeat and chest discomfort.  She was found to have A-Fib with RVR.  PMH includes metastatic breast CA with mets to pelvis and spine - she is currently taking chemo and receiving radiation; HTN, h/o C-Diff, CKD-2.    Clinical Impression  Pt admitted with above diagnosis. Pt currently with functional limitations due to the deficits listed below (see PT Problem List).  Pt will benefit from skilled PT to increase their independence and safety with mobility to allow discharge to the venue listed below.  Pt eager to participate and mobilizing well.  HR up to 138 bpm upon returning to room.  Pt with planned cardioversion Monday.  Will check back after procedure.     Follow Up Recommendations No PT follow up    Equipment Recommendations  None recommended by PT    Recommendations for Other Services       Precautions / Restrictions Precautions Precautions: Fall      Mobility  Bed Mobility Overal bed mobility: Modified Independent                Transfers Overall transfer level: Needs assistance Equipment used: Rolling walker (2 wheeled) Transfers: Sit to/from Stand Sit to Stand: Supervision         General transfer comment: supervision for safety  Ambulation/Gait Ambulation/Gait assistance: Min guard Ambulation Distance (Feet): 160 Feet Assistive device: Rolling walker (2 wheeled) Gait Pattern/deviations: Step-through pattern;Decreased stride length     General Gait Details: slow but steady gait with RW, HR 124 during ambulation, up to 138 when returning to room  Stairs            Wheelchair Mobility    Modified Rankin (Stroke Patients Only)       Balance                                              Pertinent Vitals/Pain Pain Assessment: No/denies pain    Home Living Family/patient expects to be discharged to:: Private residence Living Arrangements: Parent Available Help at Discharge: Family;Available PRN/intermittently Type of Home: House Home Access: Stairs to enter Entrance Stairs-Rails: Left Entrance Stairs-Number of Steps: 4 Home Layout: One level Home Equipment: Walker - 2 wheels;Cane - single point;Shower seat Additional Comments: mother is in her 58s     Prior Function Level of Independence: Independent with assistive device(s)         Comments: utilizes SPC or RW during ambulation      Hand Dominance   Dominant Hand: Right    Extremity/Trunk Assessment               Lower Extremity Assessment: Generalized weakness      Cervical / Trunk Assessment: Normal  Communication   Communication: No difficulties  Cognition Arousal/Alertness: Awake/alert Behavior During Therapy: WFL for tasks assessed/performed Overall Cognitive Status: Within Functional Limits for tasks assessed                      General Comments      Exercises     Assessment/Plan    PT Assessment Patient needs continued PT services  PT Problem List Decreased activity tolerance;Decreased mobility;Cardiopulmonary status limiting activity  PT Treatment Interventions DME instruction;Gait training;Functional mobility training;Therapeutic exercise;Therapeutic activities;Patient/family education    PT Goals (Current goals can be found in the Care Plan section)  Acute Rehab PT Goals PT Goal Formulation: With patient Time For Goal Achievement: 06/01/16 Potential to Achieve Goals: Good    Frequency Min 3X/week   Barriers to discharge        Co-evaluation               End of Session   Activity Tolerance: Patient tolerated treatment well Patient left: in chair;with call bell/phone within reach;with family/visitor present           Time:  1455-1510 PT Time Calculation (min) (ACUTE ONLY): 15 min   Charges:   PT Evaluation $PT Eval Low Complexity: 1 Procedure     PT G Codes:        Lisa [name],Lisa [name] 05/25/2016, 3:23 PM Carmelia Bake, PT, DPT 05/25/2016 Pager: [phone]

## 2016-05-25 NOTE — Progress Notes (Signed)
    Subjective:  Denies CP; dyspnea improved compared to yesterday   Objective:  Vitals:   05/24/16 1400 05/24/16 2003 05/24/16 2127 05/25/16 0453  BP: 97/62 130/82 108/75 103/64  Pulse: 76 82 (!) 122 (!) 103  Resp: 20 20 20 18   Temp: 98 F (36.7 C) 98.5 F (36.9 C)  97.7 F (36.5 C)  TempSrc: Oral Oral  Oral  SpO2: 100% 99% 100% 100%  Weight:      Height:        Intake/Output from previous day:  Intake/Output Summary (Last 24 hours) at 05/25/16 0705 Last data filed at 05/25/16 0600  Gross per 24 hour  Intake              420 ml  Output             1600 ml  Net            -1180 ml    Physical Exam: Physical exam: Well-developed well-nourished in no acute distress.  Skin is warm and dry.  HEENT is normal.  Neck is supple.  Chest diminished BS bases Cardiovascular exam is tachycardic and irregular Abdominal exam nontender or distended. No masses palpated. Extremities show trace edema. neuro grossly intact    Lab Results: Basic Metabolic Panel:  Recent Labs  05/24/16 0452 05/25/16 0407  NA 140 140  K 3.8 3.6  CL 110 107  CO2 23 25  GLUCOSE 126* 116*  BUN 23* 27*  CREATININE 1.09* 1.13*  CALCIUM 9.3 9.4  MG 2.0 2.0   CBC:  Recent Labs  05/22/16 1734 05/23/16 0304 05/24/16 0452  WBC 2.2* 2.9* 3.3*  NEUTROABS 1.0*  --  1.9  HGB 11.6* 11.4* 11.7*  HCT 33.8* 33.6* 35.1*  MCV 91.4 94.4 96.2  PLT 251 291 274   Cardiac Enzymes:  Recent Labs  05/22/16 2138 05/23/16 0304 05/23/16 0952  TROPONINI <0.03 <0.03 <0.03     Assessment/Plan:  74 year old female with metastatic breast cancer currently receiving chemotherapy and radiation admitted with atrial fibrillation with rapid ventricular response.  1 paroxysmal atrial fibrillation-rate is elevated. Add amiodarone 400 BID (BP borderline and will not tolerate additional increases in beta blocker); continue metoprolol 75 mg twice a day. Continue apxiaban. Plan TEE guided cardioversion on  Monday.  2 Acute diastolic congestive heart failure-likely exacerbated by atrial fibrillation. Continue lasix. Follow renal function.  3 Metastatic breast cancer  Kirk Ruths 05/25/2016, 7:05 AM

## 2016-05-25 NOTE — Progress Notes (Signed)
PROGRESS NOTE [facility]   Lisa [name]   [id number] DOB: 13-Jul-1941  DOA: 05/22/2016 PCP: Lisa Lopes, MD   Brief Narrative:  Lisa [name] is a 75 y.o. 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 difficile colitis, CKD-2, who presents with irregular heartbeat.  Pt states that she was in [place] for radiation oncology clinic today. She had mild chest discomfort and found to have irregularly irregular heartbeat by Dr. Sondra [name], therefore she was sent to ED for further evaluation and treatment. She was found to have new onset A fib with RVR in ED. She states that her symptoms may have started 2 weeks ago. Currently patient denies any chest pain. She has mild exertional shortness of breath and mild cough, but no fever or chills. No tenderness over calf areas. No hx of A fib in the past. Patient does not have nausea, vomiting, abdominal pain, diarrhea, symptoms of UTI or unilateral weakness.  Subjective: Breathing significantly improved after lasix, now can speak in full sentences. Still c/o wheezing. No desaturation. Trial of O2 at room air, patient did well. Continues in Afib, HR is controlled, cardio adjusted medications. Plan for Cardioversion on Monday.   Assessment & Plan:   New onset Atrial fibrillation with RVR (Lisa [name]): heart rate has improved off Cardizem drip, on metoprolol and eliquis. -->likelyTEE-guided cardioversion on Monday. CHA2DS2-VASc Score is 3, TNIs negative, TSH normal, Continues in Afib -Can transfer to telemetry floor  -Appreciate cardiology's consultation and recommendations. - Added Amio 400mg  BID  -Metoprolol 75 g BID - would not tolerated increase as BP  borderline low -Continue Eliquis 5 mg bid -Plan for Cardioversion on Monday at [name]   SOB 2/2 Acute diastolic failure due to afib - Improving  -Will decrease IV lasix to 20 BID- slight increase in Cr - good diuresis -Monitor BMP  -Neb treatment given Xopenex - patient felt improvement  -Wean O2 as tolerated   HLD: LDL 54 -Continue home medications: Lisa [name]  HTN: No on med at home - now borderline low  -On Metoprolol 75 mg BID  -Monitor BP   Hypokalemia: Resolved - Monitor BMP  - Check Mg   AoCKD-II: Baseline Cre is 140-1.1. Likely due to prerenal secondary to dehydration. Slight increase today -likely due to aggressive diuresis  -Monitor renal function - now on lasix   Breast cancer metastasized to multiple sites Dr Lisa [name]): pt is followed up and treated by oncologist, Dr. Marko [name] and radiation oncologist, Dr. Sondra [name]. Currently on chemo and XRT. -f/u with Dr. Marko [name] and Dr. Sondra [name]  DVT prophylaxis: Eliquis Code Status: DNR  Family Communication: Family at bedside, plan discussed in details.  Disposition Plan: Pending TEE w cardioversion, on Monday.    Consultants:   Cardiology   Procedures:   ECHO  Antimicrobials:  None    Objective: Vitals:   05/24/16 2003 05/24/16 2127 05/25/16 0453 05/25/16 1127  BP: 130/82 108/75 103/64   Pulse: 82 (!) 122 (!) 103   Resp: 20 20 18    Temp: 98.5 F (36.9 C)  97.7 F (36.5 C)   TempSrc: Oral  Oral   SpO2: 99% 100% 100% 93%  Weight:      Height:        Intake/Output Summary (Last 24 hours) at 05/25/16 1150 Last data  filed at 05/25/16 1127  Gross per 24 hour  Intake              540 ml  Output             2251 ml  Net            -1711 ml   Filed Weights   05/22/16 1716  Weight: 73.3 kg (161 lb 8 oz)    Examination:  General exam: Breathing heavy, difficulty to speak in complete full sentences  Respiratory system: mild wheezing at the upper bases b/l, Rales at the mid and lower bases, improving    Cardiovascular system: S1 & S2 heard, Irr Irr . No JVD, murmurs, rubs or gallops Gastrointestinal system: Abdomen is nondistended, soft and nontender. Extremities: Trace pedal edema.    Skin: No rashes, lesions or ulcers    Data Reviewed: I have personally reviewed following labs and imaging studies  CBC:  Recent Labs Lab 05/22/16 1734 05/23/16 0304 05/24/16 0452  WBC 2.2* 2.9* 3.3*  NEUTROABS 1.0*  --  1.9  HGB 11.6* 11.4* 11.7*  HCT 33.8* 33.6* 35.1*  MCV 91.4 94.4 96.2  PLT 251 291 [id number]   Basic Metabolic Panel:  Recent Labs Lab 05/22/16 1734 05/22/16 2138 05/23/16 0304 05/24/16 0452 05/25/16 0407  NA 138  --  140 140 140  K 3.1*  --  3.5 3.8 3.6  CL 107  --  112* 110 107  CO2 25  --  21* 23 25  GLUCOSE 103*  --  129* 126* 116*  BUN 24*  --  21* 23* 27*  CREATININE 1.37*  --  1.06* 1.09* 1.13*  CALCIUM 9.9  --  9.2 9.3 9.4  MG  --  2.0  --  2.0 2.0   GFR: Estimated Creatinine Clearance: 42.2 mL/min (by C-G formula based on SCr of 1.13 mg/dL (H)). Liver Function Tests: No results for input(s): AST, ALT, ALKPHOS, BILITOT, PROT, ALBUMIN in the last 168 hours. No results for input(s): LIPASE, AMYLASE in the last 168 hours. No results for input(s): AMMONIA in the last 168 hours. Coagulation Profile: No results for input(s): INR, PROTIME in the last 168 hours. Cardiac Enzymes:  Recent Labs Lab 05/22/16 2138 05/23/16 0304 05/23/16 0952  TROPONINI <0.03 <0.03 <0.03   BNP (last 3 results) No results for input(s): PROBNP in the last 8760 hours. HbA1C:  Recent Labs  05/23/16 0304  HGBA1C 5.6   CBG: No results for input(s): GLUCAP in the last 168 hours. Lipid Profile:  Recent Labs  05/23/16 0304  CHOL 134  HDL 31*  LDLCALC 54  TRIG 247*  CHOLHDL 4.3   Thyroid Function Tests:  Recent Labs  05/22/16 1741 05/23/16 0304  TSH 1.856  --   FREET4  --  1.04  T3FREE  --  2.8   Anemia Panel: No results for input(s): VITAMINB12, FOLATE, FERRITIN,  TIBC, IRON, RETICCTPCT in the last 72 hours. Sepsis Labs: No results for input(s): PROCALCITON, LATICACIDVEN in the last 168 hours.  No results found for this or any previous visit (from the past 240 hour(s)).    Radiology Studies: Dg Chest Port 1 View  Result Date: 05/24/2016 CLINICAL DATA:  Shortness of breath on exertion, history of breast carcinoma EXAM: PORTABLE CHEST 1 VIEW COMPARISON:  05/22/2016 FINDINGS: Cardiac shadow is mildly enlarged. Postsurgical changes are again seen and stable. Increasing right-sided pleural effusion is noted. The known right basilar mass lesion is not well appreciated on this study.  Small left effusion is noted as well. There remain some mild increased density in the left mid lung related to focal scarring. IMPRESSION: Increasing bilateral pleural effusions right greater than left. The known right basilar mass is not well appreciated on this exam. Electronically Signed   By: Inez Catalina M.D.   On: 05/24/2016 16:38     Scheduled Meds: . amiodarone  400 mg Oral BID  . apixaban  5 mg Oral BID  . furosemide  20 mg Oral BID  . loratadine  10 mg Oral Daily  . metoprolol tartrate  75 mg Oral BID  . simvastatin  20 mg Oral QHS  . vitamin B-12  1,000 mcg Oral BID  . vitamin C  500 mg Oral Daily  . vitamin E  200 Units Oral Daily   Continuous Infusions:     LOS: 3 days    Chipper Oman, MD [facility] Pager [phone]  If 7PM-7AM, please contact night-coverage [email or web address] Password [email or web address] 05/25/2016, 11:50 AM

## 2016-05-26 DIAGNOSIS — I5031 Acute diastolic (congestive) heart failure: Secondary | ICD-10-CM

## 2016-05-26 LAB — BASIC METABOLIC PANEL
ANION GAP: 9 (ref 5–15)
BUN: 39 mg/dL — ABNORMAL HIGH (ref 6–20)
CHLORIDE: 107 mmol/L (ref 101–111)
CO2: 24 mmol/L (ref 22–32)
Calcium: 9.4 mg/dL (ref 8.9–10.3)
Creatinine, Ser: 1.18 mg/dL — ABNORMAL HIGH (ref 0.44–1.00)
GFR, EST AFRICAN AMERICAN: 52 mL/min — AB (ref >60)
GFR, EST NON AFRICAN AMERICAN: 45 mL/min — AB (ref >60)
Glucose, Bld: 108 mg/dL — ABNORMAL HIGH (ref 65–99)
POTASSIUM: 3 mmol/L — AB (ref 3.5–5.1)
SODIUM: 140 mmol/L (ref 135–145)

## 2016-05-26 LAB — MAGNESIUM: MAGNESIUM: 2 mg/dL (ref 1.7–2.4)

## 2016-05-26 MED ORDER — POTASSIUM CHLORIDE CRYS ER 20 MEQ PO TBCR
40.0000 meq | EXTENDED_RELEASE_TABLET | ORAL | Status: AC
  Administered 2016-05-26 (×2): 40 meq via ORAL
  Filled 2016-05-26 (×2): qty 2

## 2016-05-26 MED ORDER — POTASSIUM CHLORIDE CRYS ER 20 MEQ PO TBCR: 40.0000 meq | EXTENDED_RELEASE_TABLET | Freq: Once | ORAL | Status: DC

## 2016-05-26 NOTE — Progress Notes (Signed)
    Subjective:  Denies CP; dyspnea improved    Objective:  Vitals:   05/25/16 1127 05/25/16 1330 05/25/16 2152 05/26/16 0431  BP:  108/60 111/79 101/63  Pulse:  85 90 95  Resp:  20  20  Temp:  98.9 F (37.2 C)  97.7 F (36.5 C)  TempSrc:  Oral  Oral  SpO2: 93% 99% 99% 96%  Weight:      Height:        Intake/Output from previous day:  Intake/Output Summary (Last 24 hours) at 05/26/16 0715 Last data filed at 05/26/16 0431  Gross per 24 hour  Intake              360 ml  Output             2051 ml  Net            -1691 ml    Physical Exam: Physical exam: Well-developed well-nourished in no acute distress.  Skin is warm and dry.  HEENT is normal.  Neck is supple.  Chest minimal basilar crackles Cardiovascular exam is irregular Abdominal exam nontender or distended. No masses palpated. Extremities show trace edema. neuro grossly intact    Lab Results: Basic Metabolic Panel:  Recent Labs  05/24/16 0452 05/25/16 0407 05/26/16 0555  NA 140 140 140  K 3.8 3.6 3.0*  CL 110 107 107  CO2 23 25 24   GLUCOSE 126* 116* 108*  BUN 23* 27* 39*  CREATININE 1.09* 1.13* 1.18*  CALCIUM 9.3 9.4 9.4  MG 2.0 2.0  --    CBC:  Recent Labs  05/24/16 0452  WBC 3.3*  NEUTROABS 1.9  HGB 11.7*  HCT 35.1*  MCV 96.2  PLT 274   Cardiac Enzymes:  Recent Labs  05/23/16 0952  TROPONINI <0.03     Assessment/Plan:  75 year old female with metastatic breast cancer currently receiving chemotherapy and radiation admitted with atrial fibrillation with rapid ventricular response.  1 paroxysmal atrial fibrillation-rate improved. Continue amiodarone 400 BID and metoprolol 75 mg twice a day. Continue apxiaban. Plan TEE guided cardioversion tomorrow.  2 Acute diastolic congestive heart failure-likely exacerbated by atrial fibrillation. Renal function worse today; hold lasix and follow; hopefully will improve once sinus reestablished.  3 Metastatic breast cancer  Kirk Ruths 05/26/2016, 7:15 AM

## 2016-05-26 NOTE — Progress Notes (Signed)
PROGRESS NOTE [facility]   Lisa [name]   [id number] DOB: 04-12-1941  DOA: 05/22/2016 PCP: [name], MD   Brief Narrative:  Lisa [name] is a 75 y.o. 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 difficile colitis, CKD-2, who presents with irregular heartbeat.  Pt states that she was in [place] for radiation oncology clinic today. She had mild chest discomfort and found to have irregularly irregular heartbeat by Dr. Sondra Come, therefore she was sent to ED for further evaluation and treatment. She was found to have new onset A fib with RVR in ED. She states that her symptoms may have started 2 weeks ago. Currently patient denies any chest pain. She has mild exertional shortness of breath and mild cough, but no fever or chills. No tenderness over calf areas. No hx of A fib in the past. Patient does not have nausea, vomiting, abdominal pain, diarrhea, symptoms of UTI or unilateral weakness.  Subjective: Continues to improve, remains in O2 for comfort  Assessment & Plan:   New onset Atrial fibrillation with RVR ([place]): heart rate has improved off Cardizem drip, on metoprolol and eliquis. -->likelyTEE-guided cardioversion on Monday. CHA2DS2-VASc Score is 3, TNIs negative, TSH normal, Continues in Afib -Can transfer to telemetry floor  -Appreciate cardiology's consultation and recommendations. -Continue 400mg  BID  -Metoprolol 75 g BID - would not tolerated increase as BP borderline low -Continue Eliquis 5 mg bid -Plan for Cardioversion on Monday at [facility]   SOB 2/2 Acute diastolic failure due to afib - Improving  -d/c Lasix Cr worsening  -Monitor BMP  -d/c  Xopenex -Wean O2 as tolerated   HLD: LDL 54 -Continue home medications: Zocor  HTN: No on med at home - now borderline low  -On Metoprolol 75 mg BID  -Monitor BP   Hypokalemia: Resolved -Monitor BMP   AoCKD-II: Baseline Cre is 140-1.1. Likely due to prerenal secondary to dehydration. Slight increase again - likely due to aggressive diuresis  -Monitor renal function  -Lasix d/c'ed   Breast cancer metastasized to multiple sites [facility]): pt is followed up and treated by oncologist, Dr. Marko Plume and radiation oncologist, Dr. Sondra Come. Currently on chemo and XRT. -f/u with Dr. Marko Plume and Dr. Sondra Come  DVT prophylaxis: Eliquis Code Status: DNR  Family Communication: Family at bedside, plan discussed in details.  Disposition Plan: Pending TEE w cardioversion, on Monday.   Consultants:   Cardiology   Procedures:   ECHO  Antimicrobials:  None    Objective: Vitals:   05/25/16 1127 05/25/16 1330 05/25/16 2152 05/26/16 0431  BP:  108/60 111/79 101/63  Pulse:  85 90 95  Resp:  20  20  Temp:  98.9 F (37.2 C)  97.7 F (36.5 C)  TempSrc:  Oral  Oral  SpO2: 93% 99% 99% 96%  Weight:      Height:        Intake/Output Summary (Last 24 hours) at 05/26/16 0952 Last data filed at 05/26/16 0830  Gross per 24 hour  Intake              960 ml  Output             1850 ml  Net             -890 ml  Filed Weights   05/22/16 1716  Weight: 73.3 kg (161 lb 8 oz)    Examination:  General exam: calm and comfortable  Respiratory system: minimal rales at the b/l lower bases. No wheezing. Good air entry  Cardiovascular system: S1 & S2 heard, Irr Irr . No JVD, murmurs, rubs or gallops Gastrointestinal system: Abdomen is nondistended, soft and nontender. Extremities: Trace pedal edema.    Skin: No rashes, lesions or ulcers    Data Reviewed: I have personally reviewed following labs and imaging studies  CBC:  Recent Labs Lab 05/22/16 1734 05/23/16 0304 05/24/16 0452  WBC  2.2* 2.9* 3.3*  NEUTROABS 1.0*  --  1.9  HGB 11.6* 11.4* 11.7*  HCT 33.8* 33.6* 35.1*  MCV 91.4 94.4 96.2  PLT 251 291 123456   Basic Metabolic Panel:  Recent Labs Lab 05/22/16 1734 05/22/16 2138 05/23/16 0304 05/24/16 0452 05/25/16 0407 05/26/16 0555  NA 138  --  140 140 140 140  K 3.1*  --  3.5 3.8 3.6 3.0*  CL 107  --  112* 110 107 107  CO2 25  --  21* 23 25 24   GLUCOSE 103*  --  129* 126* 116* 108*  BUN 24*  --  21* 23* 27* 39*  CREATININE 1.37*  --  1.06* 1.09* 1.13* 1.18*  CALCIUM 9.9  --  9.2 9.3 9.4 9.4  MG  --  2.0  --  2.0 2.0 2.0   GFR: Estimated Creatinine Clearance: 40.4 mL/min (by C-G formula based on SCr of 1.18 mg/dL (H)). Liver Function Tests: No results for input(s): AST, ALT, ALKPHOS, BILITOT, PROT, ALBUMIN in the last 168 hours. No results for input(s): LIPASE, AMYLASE in the last 168 hours. No results for input(s): AMMONIA in the last 168 hours. Coagulation Profile: No results for input(s): INR, PROTIME in the last 168 hours. Cardiac Enzymes:  Recent Labs Lab 05/22/16 2138 05/23/16 0304 05/23/16 0952  TROPONINI <0.03 <0.03 <0.03   BNP (last 3 results) No results for input(s): PROBNP in the last 8760 hours. HbA1C: No results for input(s): HGBA1C in the last 72 hours. CBG: No results for input(s): GLUCAP in the last 168 hours. Lipid Profile: No results for input(s): CHOL, HDL, LDLCALC, TRIG, CHOLHDL, LDLDIRECT in the last 72 hours. Thyroid Function Tests: No results for input(s): TSH, T4TOTAL, FREET4, T3FREE, THYROIDAB in the last 72 hours. Anemia Panel: No results for input(s): VITAMINB12, FOLATE, FERRITIN, TIBC, IRON, RETICCTPCT in the last 72 hours. Sepsis Labs: No results for input(s): PROCALCITON, LATICACIDVEN in the last 168 hours.  No results found for this or any previous visit (from the past 240 hour(s)).    Radiology Studies: Dg Chest Port 1 View  Result Date: 05/24/2016 CLINICAL DATA:  Shortness of breath on exertion, history  of breast carcinoma EXAM: PORTABLE CHEST 1 VIEW COMPARISON:  05/22/2016 FINDINGS: Cardiac shadow is mildly enlarged. Postsurgical changes are again seen and stable. Increasing right-sided pleural effusion is noted. The known right basilar mass lesion is not well appreciated on this study. Small left effusion is noted as well. There remain some mild increased density in the left mid lung related to focal scarring. IMPRESSION: Increasing bilateral pleural effusions right greater than left. The known right basilar mass is not well appreciated on this exam. Electronically Signed   By: Inez Catalina M.D.   On: 05/24/2016 16:38     Scheduled Meds: . amiodarone  400 mg Oral BID  . apixaban  5 mg Oral BID  . loratadine  10 mg Oral Daily  . metoprolol tartrate  75 mg Oral BID  . potassium chloride  40 mEq Oral Q4H  . simvastatin  20 mg Oral QHS  . vitamin B-12  1,000 mcg Oral BID  . vitamin C  500 mg Oral Daily  . vitamin E  200 Units Oral Daily   Continuous Infusions:    LOS: 4 days    Chipper Oman, MD [facility] Pager [phone]  If 7PM-7AM, please contact night-coverage [email or web address] Password [email or web address] 05/26/2016, 9:52 AM

## 2016-05-27 ENCOUNTER — Inpatient Hospital Stay (HOSPITAL_COMMUNITY): Payer: Medicare Other | Admitting: Anesthesiology

## 2016-05-27 ENCOUNTER — Ambulatory Visit: Payer: Medicare Other

## 2016-05-27 ENCOUNTER — Inpatient Hospital Stay (HOSPITAL_COMMUNITY): Payer: Medicare Other

## 2016-05-27 ENCOUNTER — Telehealth: Payer: Self-pay | Admitting: Oncology

## 2016-05-27 ENCOUNTER — Encounter (HOSPITAL_COMMUNITY): Payer: Self-pay | Admitting: Anesthesiology

## 2016-05-27 ENCOUNTER — Encounter (HOSPITAL_COMMUNITY)
Admission: EM | Disposition: A | Discharge status: Home / Self Care | Payer: Self-pay | Source: Home / Self Care | Attending: Family Medicine

## 2016-05-27 DIAGNOSIS — N189 Chronic kidney disease, unspecified: Secondary | ICD-10-CM

## 2016-05-27 DIAGNOSIS — I351 Nonrheumatic aortic (valve) insufficiency: Secondary | ICD-10-CM

## 2016-05-27 DIAGNOSIS — I5031 Acute diastolic (congestive) heart failure: Secondary | ICD-10-CM

## 2016-05-27 DIAGNOSIS — I4891 Unspecified atrial fibrillation: Secondary | ICD-10-CM

## 2016-05-27 DIAGNOSIS — N179 Acute kidney failure, unspecified: Secondary | ICD-10-CM

## 2016-05-27 DIAGNOSIS — I10 Essential (primary) hypertension: Secondary | ICD-10-CM

## 2016-05-27 HISTORY — PX: TEE WITHOUT CARDIOVERSION: SHX5443

## 2016-05-27 HISTORY — PX: CARDIOVERSION: SHX1299

## 2016-05-27 LAB — BASIC METABOLIC PANEL
Anion gap: 6 (ref 5–15)
BUN: 38 mg/dL — AB (ref 6–20)
CHLORIDE: 110 mmol/L (ref 101–111)
CO2: 22 mmol/L (ref 22–32)
CREATININE: 1.18 mg/dL — AB (ref 0.44–1.00)
Calcium: 10.3 mg/dL (ref 8.9–10.3)
GFR calc Af Amer: 52 mL/min — ABNORMAL LOW (ref >60)
GFR, EST NON AFRICAN AMERICAN: 45 mL/min — AB (ref >60)
GLUCOSE: 113 mg/dL — AB (ref 65–99)
Potassium: 3.9 mmol/L (ref 3.5–5.1)
SODIUM: 138 mmol/L (ref 135–145)

## 2016-05-27 SURGERY — CARDIOVERSION
Anesthesia: General

## 2016-05-27 MED ORDER — LACTATED RINGERS IV SOLN
INTRAVENOUS | Status: DC | PRN
Start: 2016-05-27 — End: 2016-05-27
  Administered 2016-05-27: 13:00:00 via INTRAVENOUS

## 2016-05-27 MED ORDER — BUTAMBEN-TETRACAINE-BENZOCAINE 2-2-14 % EX AERO
INHALATION_SPRAY | CUTANEOUS | Status: DC | PRN
  Administered 2016-05-27: 2 via TOPICAL

## 2016-05-27 MED ORDER — PROPOFOL 10 MG/ML IV BOLUS
INTRAVENOUS | Status: DC | PRN
  Administered 2016-05-27: 10 mg via INTRAVENOUS

## 2016-05-27 MED ORDER — FLUTICASONE PROPIONATE 50 MCG/ACT NA SUSP
1.0000 | Freq: Two times a day (BID) | NASAL | Status: DC
  Administered 2016-05-27 – 2016-05-31 (×8): 1 via NASAL
  Filled 2016-05-27: qty 16

## 2016-05-27 MED ORDER — PROPOFOL 500 MG/50ML IV EMUL
INTRAVENOUS | Status: DC | PRN
  Administered 2016-05-27: 75 ug/kg/min via INTRAVENOUS

## 2016-05-27 MED ORDER — LIDOCAINE HCL (CARDIAC) 20 MG/ML IV SOLN
INTRAVENOUS | Status: DC | PRN
  Administered 2016-05-27: 30 mg via INTRAVENOUS

## 2016-05-27 NOTE — Progress Notes (Signed)
Follow-up scheduled in Afib clinic on 06/03/16 at 10am with Roderic Palau, NP. Ashiya Kinkead PA-C

## 2016-05-27 NOTE — Op Note (Signed)
Procedure: Electrical Cardioversion Indications:  Atrial Fibrillation  Procedure Details:  Consent: Risks of procedure as well as the alternatives and risks of each were explained to the (patient/caregiver).  Consent for procedure obtained.  Time Out: Verified patient identification, verified procedure, site/side was marked, verified correct patient position, special equipment/implants available, medications/allergies/relevent history reviewed, required imaging and test results available.  Performed  Patient placed on cardiac monitor, pulse oximetry, supplemental oxygen as necessary.  Sedation given: IV propofol, Dr. Therisa Doyne Pacer pads placed anterior and posterior chest.  Cardioverted 1 time(s).  Cardioversion with synchronized biphasic 120J shock.  Evaluation: Findings: Post procedure EKG shows: NSR Complications: None Patient did tolerate procedure well.  Time Spent Directly with the Patient:  30 minutes   Lisa [name] 05/27/2016, 1:28 PM

## 2016-05-27 NOTE — Op Note (Signed)
INDICATIONS: precardioversion, atrial fibrillation  PROCEDURE:   Informed consent was obtained prior to the procedure. The risks, benefits and alternatives for the procedure were discussed and the patient comprehended these risks.  Risks include, but are not limited to, cough, sore throat, vomiting, nausea, somnolence, esophageal and stomach trauma or perforation, bleeding, low blood pressure, aspiration, pneumonia, infection, trauma to the teeth and death.    After a procedural time-out, the oropharynx was anesthetized with 20% benzocaine spray.   During this procedure the patient was administered IV propofol by Anesthesiology for sedation.  The patient's heart rate, blood pressure, and oxygen saturationweare monitored continuously during the procedure.   The transesophageal probe was inserted in the esophagus and stomach without difficulty and multiple views were obtained.  The patient was kept under observation until the patient left the procedure room.  The patient left the procedure room in stable condition.   Agitated microbubble saline contrast was not administered.  COMPLICATIONS:    There were no immediate complications.  FINDINGS:  No left atrial thrombus. Mildly dilated left atrium. Normal LVEF. Moderate MR. Trivial pericardial effusion.  RECOMMENDATIONS:    Proceed with DCCV.  Time Spent Directly with the Patient:  30 minutes   Lisa [name] 05/27/2016, 1:26 PM

## 2016-05-27 NOTE — Transfer of Care (Signed)
Immediate Anesthesia Transfer of Care Note  Patient: Lisa [name]  Procedure(s) Performed: Procedure(s): CARDIOVERSION (N/A) TRANSESOPHAGEAL ECHOCARDIOGRAM (TEE) (N/A)  Patient Location: Endoscopy Unit  Anesthesia Type:General  Level of Consciousness: awake, oriented and patient cooperative  Airway & Oxygen Therapy: Patient Spontanous Breathing and Patient connected to nasal cannula oxygen  Post-op Assessment: Report given to RN and Post -op Vital signs reviewed and stable  Post vital signs: Reviewed  Last Vitals:  Vitals:   05/27/16 1038 05/27/16 1135  BP: 114/68 (!) 140/92  Pulse: 80 93  Resp:  (!) 27  Temp:  36.9 C    Last Pain:  Vitals:   05/27/16 1135  TempSrc: Oral  PainSc:       Patients Stated Pain Goal: 3 ([id number])  Complications: No apparent anesthesia complications

## 2016-05-27 NOTE — Progress Notes (Signed)
PROGRESS NOTE [facility]   SHANNAY DINGER   [id number] DOB: Jun 19, 1941  DOA: 05/22/2016 PCP: [name], MD   Brief Narrative:  Lisa [name] is a 75 y.o. 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 difficile colitis, CKD-2, who presents with irregular heartbeat.  Pt states that she was in [place] for radiation oncology clinic today. She had mild chest discomfort and found to have irregularly irregular heartbeat by Dr. Sondra Come, therefore she was sent to ED for further evaluation and treatment. She was found to have new onset A fib with RVR in ED. She states that her symptoms may have started 2 weeks ago. Currently patient denies any chest pain. She has mild exertional shortness of breath and mild cough, but no fever or chills. No tenderness over calf areas. No hx of A fib in the past. Patient does not have nausea, vomiting, abdominal pain, diarrhea, symptoms of UTI or unilateral weakness.  Subjective: Had cardioversion today, now back to NSR after 1 shock. Patient feel slight SOB and wheezy. Dont want to go home, does not feel safe at this time, without any support.   Assessment & Plan:   New onset Atrial fibrillation with RVR [facility]): s/p TEE Cardioversion -  Back to sinus, on metoprolol and eliquis. - CHA2DS2-VASc Score is 3, TNIs negative, TSH normal -Appreciate cardiology's consultation and recommendations. -Continue Amio 400mg  BID  -Metoprolol 75 g BID - would not tolerated increase as BP borderline low -Continue Eliquis 5 mg bid -Plan for discharge tomorrow in AM   SOB 2/2 Acute diastolic failure due to afib  -d/c Lasix Cr worsening  -Monitor BMP  -get neb  treatment  -Wean O2 as tolerated   HLD: LDL 54 -Continue home medications: Zocor  HTN: No on med at home - now borderline low  -On Metoprolol 75 mg BID  -Monitor BP   Hypokalemia: Resolved -Monitor BMP   AoCKD-II: Baseline Cre 1.1 - slight increase likely due to aggressive diuresis  -Monitor renal function  -Lasix d/c'ed   Breast cancer metastasized to multiple sites [facility]): pt is followed up and treated by oncologist, Dr. Marko Plume and radiation oncologist, Dr. Sondra Come. Currently on chemo and XRT. -f/u with Dr. Marko Plume and Dr. Sondra Come  DVT prophylaxis: Eliquis Code Status: DNR  Family Communication: Family at bedside, plan discussed in details.  Disposition Plan: Plan for discharge in AM   Consultants:   Cardiology   Procedures:   ECHO  Antimicrobials:  None    Objective: Vitals:   05/27/16 1327 05/27/16 1330 05/27/16 1340 05/27/16 1350  BP: (!) 89/51 (!) 96/55 (!) 98/51 (!) 111/54  Pulse: (!) 55 (!) 55 (!) 56 (!) 56  Resp: 20 (!) 23 (!) 23 (!) 22  Temp: 98.6 F (37 C)     TempSrc: Oral     SpO2: 97% 100% 100% 98%  Weight:      Height:        Intake/Output Summary (Last 24 hours) at 05/27/16 1713 Last data filed at 05/27/16 1319  Gross per 24 hour  Intake              460 ml  Output              770 ml  Net             -  310 ml   Filed Weights   05/22/16 1716  Weight: 73.3 kg (161 lb 8 oz)    Examination:  General exam: calm and comfortable  Respiratory system: CTA. Mild wheezing in upper lobes.  Cardiovascular system: S1 & S2 heard, RRR . No JVD, murmurs, rubs or gallops Gastrointestinal system: Abdomen is nondistended, soft and nontender. Extremities: No pedal edema.    Skin: No rashes, lesions or ulcers    Data Reviewed: I have personally reviewed following labs and imaging studies  CBC:  Recent Labs Lab 05/22/16 1734 05/23/16 0304 05/24/16 0452  WBC 2.2* 2.9* 3.3*  NEUTROABS 1.0*  --  1.9  HGB 11.6* 11.4* 11.7*  HCT 33.8*  33.6* 35.1*  MCV 91.4 94.4 96.2  PLT 251 291 [id number]   Basic Metabolic Panel:  Recent Labs Lab 05/22/16 2138 05/23/16 0304 05/24/16 0452 05/25/16 0407 05/26/16 0555 05/27/16 0437  NA  --  140 140 140 140 138  K  --  3.5 3.8 3.6 3.0* 3.9  CL  --  112* 110 107 107 110  CO2  --  21* 23 25 24 22   GLUCOSE  --  129* 126* 116* 108* 113*  BUN  --  21* 23* 27* 39* 38*  CREATININE  --  1.06* 1.09* 1.13* 1.18* 1.18*  CALCIUM  --  9.2 9.3 9.4 9.4 10.3  MG 2.0  --  2.0 2.0 2.0  --    GFR: Estimated Creatinine Clearance: 40.4 mL/min (by C-G formula based on SCr of 1.18 mg/dL (H)). Liver Function Tests: No results for input(s): AST, ALT, ALKPHOS, BILITOT, PROT, ALBUMIN in the last 168 hours. No results for input(s): LIPASE, AMYLASE in the last 168 hours. No results for input(s): AMMONIA in the last 168 hours. Coagulation Profile: No results for input(s): INR, PROTIME in the last 168 hours. Cardiac Enzymes:  Recent Labs Lab 05/22/16 2138 05/23/16 0304 05/23/16 0952  TROPONINI <0.03 <0.03 <0.03   BNP (last 3 results) No results for input(s): PROBNP in the last 8760 hours. HbA1C: No results for input(s): HGBA1C in the last 72 hours. CBG: No results for input(s): GLUCAP in the last 168 hours. Lipid Profile: No results for input(s): CHOL, HDL, LDLCALC, TRIG, CHOLHDL, LDLDIRECT in the last 72 hours. Thyroid Function Tests: No results for input(s): TSH, T4TOTAL, FREET4, T3FREE, THYROIDAB in the last 72 hours. Anemia Panel: No results for input(s): VITAMINB12, FOLATE, FERRITIN, TIBC, IRON, RETICCTPCT in the last 72 hours. Sepsis Labs: No results for input(s): PROCALCITON, LATICACIDVEN in the last 168 hours.  No results found for this or any previous visit (from the past 240 hour(s)).    Radiology Studies: No results found.   Scheduled Meds: . amiodarone  400 mg Oral BID  . apixaban  5 mg Oral BID  . loratadine  10 mg Oral Daily  . metoprolol tartrate  75 mg Oral BID  .  simvastatin  20 mg Oral QHS  . vitamin B-12  1,000 mcg Oral BID  . vitamin C  500 mg Oral Daily  . vitamin E  200 Units Oral Daily   Continuous Infusions:    LOS: 5 days    Chipper Oman, MD [facility] Pager [phone]  If 7PM-7AM, please contact night-coverage [email or web address] Password [email or web address] 05/27/2016, 5:13 PM

## 2016-05-27 NOTE — Care Management Important Message (Signed)
Important Message  Patient Details IM Letter given to [name]/Case Manager to present to Patient Name: CASMIRA MAMOLA MRN: [id number] Date of Birth: Mar 21, 1941   Medicare Important Message Given:  Yes    [name] 05/27/2016, 9:25 AMImportant Message  Patient Details  Name: ADELA NILSSON MRN: [id number] Date of Birth: May 16, 1941   Medicare Important Message Given:  Yes    [name] 05/27/2016, 9:25 AM

## 2016-05-27 NOTE — Progress Notes (Signed)
Patient Name: Lisa [name] Date of Encounter: 05/27/2016  Primary Cardiologist: [facility] Problem List     Principal Problem:   Atrial fibrillation with RVR ([place]) Active Problems:   HLD (hyperlipidemia)   Essential hypertension   Bone metastases (HCC)   Hypokalemia   Acute kidney injury superimposed on CKD ([place])   Breast cancer metastasized to multiple sites ([place])   SOB (shortness of breath)   Acute diastolic heart failure (HCC)    Subjective   Asymptomatic. No CP, SOB, palpitations.  Inpatient Medications    . amiodarone  400 mg Oral BID  . apixaban  5 mg Oral BID  . loratadine  10 mg Oral Daily  . metoprolol tartrate  75 mg Oral BID  . simvastatin  20 mg Oral QHS  . vitamin B-12  1,000 mcg Oral BID  . vitamin C  500 mg Oral Daily  . vitamin E  200 Units Oral Daily    Vital Signs    Vitals:   05/26/16 1335 05/26/16 2048 05/27/16 0534 05/27/16 1038  BP: 106/65 115/68 108/67 114/68  Pulse: 90 65 75 80  Resp: 20 18 18    Temp: 97.8 F (36.6 C) 98.2 F (36.8 C) 98 F (36.7 C)   TempSrc: Oral Oral Oral   SpO2: 98% 97% 97%   Weight:      Height:        Intake/Output Summary (Last 24 hours) at 05/27/16 1057 Last data filed at 05/27/16 0900  Gross per 24 hour  Intake              600 ml  Output              970 ml  Net             -370 ml   Filed Weights   05/22/16 1716  Weight: 161 lb 8 oz (73.3 kg)    Physical Exam    General: Well developed, well nourished WF, in no acute distress. HEENT: Normocephalic, atraumatic, sclera non-icteric, no xanthomas, nares are without discharge. Neck: Negative for carotid bruits. JVP not elevated. Lungs: Clear bilaterally to auscultation without wheezes, rales, or rhonchi. Breathing is unlabored. Cardiac: Irregularly irregular, S1 S2 without murmurs, rubs, or gallops.  Abdomen: Soft, non-tender, non-distended with normoactive bowel sounds. No rebound/guarding. Extremities: No clubbing or cyanosis. No  edema. Distal pedal pulses are 2+ and equal bilaterally. Skin: Warm and dry, no significant rash. Neuro: Alert and oriented X 3. Sensation in tact. Follows commands. Psych:  Responds to questions appropriately with a normal affect.  Labs    CBC No results for input(s): WBC, NEUTROABS, HGB, HCT, MCV, PLT in the last 72 hours. Basic Metabolic Panel  Recent Labs  05/25/16 0407 05/26/16 0555 05/27/16 0437  NA 140 140 138  K 3.6 3.0* 3.9  CL 107 107 110  CO2 25 24 22   GLUCOSE 116* 108* 113*  BUN 27* 39* 38*  CREATININE 1.13* 1.18* 1.18*  CALCIUM 9.4 9.4 10.3  MG 2.0 2.0  --     Telemetry    Persistent atrial fib rates 90s-120s  Radiology    Dg Chest 2 View  Result Date: 05/22/2016 CLINICAL DATA:  Recheck heart rate, irregular pulse EXAM: CHEST  2 VIEW COMPARISON:  05/02/2016, 08/02/2015 FINDINGS: Postsurgical changes of the right breast/axilla. Posterior stabilization rod and fixating screws within the spine. Multiple thoracic compression deformities at the lower thoracic spine, grossly unchanged. Diffuse interstitial opacities likely reflect chronic change.  Left mid lung zone airspace opacity. Tiny bilateral pleural effusions or CP angle thickening. Stable cardiomediastinal silhouette. No pneumothorax. IMPRESSION: 1. Oval opacity left mid lung zone could relate to an area of inflammatory infiltrate. 2. Diffuse interstitial opacities, likely reflecting chronic change. Tiny bilateral effusions or pleural thickening. 3. Known metastatic pulmonary nodules are better appreciated on recent PET-CT. 4. Moderate severe compression deformities of the lower thoracic spine, grossly unchanged. Electronically Signed   By: Donavan Foil M.D.   On: 05/22/2016 18:49   Ct Head W & Wo Contrast  Result Date: 05/23/2016 CLINICAL DATA:  75 year old hypertensive female with metastatic breast cancer with ongoing chemotherapy. Headaches. Initial encounter. EXAM: CT HEAD WITHOUT AND WITH CONTRAST TECHNIQUE:  Contiguous axial images were obtained from the base of the skull through the vertex without and with intravenous contrast CONTRAST:  78mL ISOVUE-300 IOPAMIDOL (ISOVUE-300) INJECTION 61% COMPARISON:  No comparison head CT or brain MR. PET CT 05/02/2006. FINDINGS: Brain: No parenchymal enhancing lesion. No CT evidence of large acute infarct. No intracranial hemorrhage. Global atrophy without hydrocephalus. Vascular: Vascular calcifications. Skull: 1 cm left frontal calvarial lucency may represent metastatic disease. Slightly sclerotic appearance of the right C1 ring may represent metastatic disease. Sinuses/Orbits: No acute orbital abnormality. Visualized sinuses and mastoid air cells/ middle ear cavities are clear. Other: Negative. IMPRESSION: No parenchymal enhancing lesion. 1 cm left frontal calvarial lucency may represent metastatic disease. Slightly sclerotic appearance of the right C1 ring may represent metastatic disease. No CT evidence of large acute infarct. Global atrophy. Electronically Signed   By: Genia Del M.D.   On: 05/23/2016 09:58   Nm Pet Image Restag (ps) Skull Base To Thigh  Result Date: 05/02/2016 CLINICAL DATA:  Subsequent treatment strategy for metastatic breast cancer. EXAM: NUCLEAR MEDICINE PET SKULL BASE TO THIGH TECHNIQUE: 8.21 mCi F-18 FDG was injected intravenously. Full-ring PET imaging was performed from the skull base to thigh after the radiotracer. CT data was obtained and used for attenuation correction and anatomic localization. FASTING BLOOD GLUCOSE:  Value: 102 mg/dl COMPARISON:  PET-CT 12/22/2015. FINDINGS: NECK No hypermetabolic lymph nodes in the neck. However, there is some poorly defined infiltrative appearing soft tissue haziness in the in lower neck adjacent to the thyroid gland which is diffusely hypermetabolic (SUVmax = 6.3). CHEST Numerous pulmonary nodules are again noted throughout the lungs bilaterally, generally increased in size and number compared to the  prior examination, with the largest of these nodules in the right lung associated with the right major fissure (image 31 of series 7) measuring 13 x 16 mm (SUVmax = 29.1). Large partially calcified pleural-based mass in the inferior aspect of the right lower lobe appear is similar to the prior examination, and is again hypermetabolic (SUVmax = [id number]). Several hypermetabolic bilateral hilar and mediastinal lymph nodes are noted, with the most hypermetabolism in a subcarinal lymph node (SUVmax = 8.1), which appears borderline enlarged measuring approximately 8 mm. Mild cardiomegaly. There is aortic atherosclerosis, as well as atherosclerosis of the great vessels of the mediastinum and the coronary arteries, including calcified atherosclerotic plaque in the left anterior descending and right coronary arteries. Calcifications of the aortic valve. Small left pleural effusion. Mild diffuse interlobular septal thickening in the lungs, favored to reflect a background of very mild interstitial pulmonary edema. ABDOMEN/PELVIS There are some patchy areas of heterogeneous increased metabolic activity in the liver, most of which have no discrete CT correlate. However, there are some tiny capsular areas overlying the right lobe of the liver that  are partially calcified (image 93 of series 4) which correspond to hypermetabolic lesions (SUVmax = 9.6)on the PET portion of the examination, concerning for hepatic metastasis. No abnormal hypermetabolic activity within the pancreas, adrenal glands, or spleen. In the distal right common iliac nodal chain there is an enlarged lymph node measuring 12 mm in short axis (image 134 of series 4) which is hypermetabolic (SUVmax = 6.9), similar to the prior examination. Low-attenuation lesion in the lower pole of the left kidney is incompletely characterized, but presumably a parapelvic cysts, similar to the prior study. No significant volume of ascites. No pneumoperitoneum. No pathologic  dilatation of small bowel or colon. Status post hysterectomy. Ovaries are not confidently identified may be surgically absent or atrophic. SKELETON Innumerable hypermetabolic lesions are noted throughout the visualized axial and appendicular skeleton. Many of these are old lesions seen on prior examinations, however, these generally appear increased in size and number. Specific examples include new lesions in the right skull base just lateral to the foramen magnum (SUVmax = 10.8), a much larger hypermetabolic lesion (SUVmax = 20.0)which is mixed lytic and sclerotic in the right scapular glenoid(image 44 of series 4), and persistent increase in areas of irregular lucency and sclerosis throughout the sacrum which are diffusely hypermetabolic (SUVmax = [id number]). Multiple other osseous lesions are also noted. Orthopedic fixation hardware in the lower thoracic and upper lumbar spine again noted. Surgical clips in the right axillary region from prior lymph node dissection. Status post right modified radical mastectomy. IMPRESSION: 1. Today's study demonstrates general progression of disease, as evidenced by increased number and size of numerous osseous lesions and pulmonary lesions, as discussed above. 2. Additional incidental findings, as above. Electronically Signed   By: Vinnie Langton M.D.   On: 05/02/2016 14:43   Dg Chest Port 1 View  Result Date: 05/24/2016 CLINICAL DATA:  Shortness of breath on exertion, history of breast carcinoma EXAM: PORTABLE CHEST 1 VIEW COMPARISON:  05/22/2016 FINDINGS: Cardiac shadow is mildly enlarged. Postsurgical changes are again seen and stable. Increasing right-sided pleural effusion is noted. The known right basilar mass lesion is not well appreciated on this study. Small left effusion is noted as well. There remain some mild increased density in the left mid lung related to focal scarring. IMPRESSION: Increasing bilateral pleural effusions right greater than left. The known right  basilar mass is not well appreciated on this exam. Electronically Signed   By: Inez Catalina M.D.   On: 05/24/2016 16:38   Patient Profile     41F with HTN, longstanding history of metastatic breast cancer (involving skeleton and lungs with PET scan 04/2016 showing increased number of size of pulm/bone lesions), hyperlipidemia, CKD stage II admitted with newly recognized atrial fib RVR and suspected acute diastolic CHF due to AF. Developed AKI on CKD with hypokalemia with diuresis, thus Lasix discontinued. 2D Echo 05/23/16: EF 55-60%, no RWMA, mod Mr, mild LAE/RAE, PASP 57mmHg.   Assessment & Plan    1. Persistent atrial fib - for TEE/DCCV today at [facility]. Will need to outline plan for de-escalation of amiodarone after DC.  2. HTN - controlled.  3. Coronary calcium seen on CT scan - asymptomatic. Continue to observe for any angina.  4. Acute diastolic CHF - improved. Further Lasix on hold due to AKI on CKD with hypokalemia. Will order f/u weight for AM.  Signed, Charlie Pitter, PA-C  05/27/2016, 10:57 AM   The patient has been seen in conjunction with Melina Copa, PAC. All aspects of  care have been considered and discussed. The patient has been personally interviewed, examined, and all clinical data has been reviewed.   For TEE guided electrical cardioversion today.  Possibly home later today if cardioversion successful.  Will need early follow-up with Dr. Sallyanne Kuster.

## 2016-05-27 NOTE — Telephone Encounter (Signed)
Called [facility] [place] and spoke to Lisa [name], Therapist, sports.  Lisa [name] will be having a cardioversion today at [facility] and will not be able to have radiation today.  Notified [place] [facility].

## 2016-05-27 NOTE — Care Management Note (Signed)
Case Management Note  Patient Details  Name: Lisa [name] MRN: [id number] Date of Birth: 11/02/1940  Subjective/Objective: Returned from [facility] for TEE. On 02-will monitor if needed @ home.                   Action/Plan:d/c plan home.   Expected Discharge Date:   (unknown)               Expected Discharge Plan:  Home/Self Care  In-House Referral:     Discharge planning Services  CM Consult  Post Acute Care Choice:    Choice offered to:     DME Arranged:    DME Agency:     HH Arranged:    HH Agency:     Status of Service:  In process, will continue to follow  If discussed at Long Length of Stay Meetings, dates discussed:    Additional Comments:  Dessa Phi, RN 05/27/2016, 2:34 PM

## 2016-05-27 NOTE — Anesthesia Procedure Notes (Signed)
Date/Time: 05/27/2016 1:09 PM Performed by: Jenne Campus Pre-anesthesia Checklist: Patient identified, Emergency Drugs available, Suction available, Patient being monitored and Timeout performed Patient Re-evaluated:Patient Re-evaluated prior to inductionOxygen Delivery Method: Nasal cannula

## 2016-05-27 NOTE — Progress Notes (Signed)
  Echocardiogram Echocardiogram Transesophageal has been performed.  Lisa [name] 05/27/2016, 2:28 PM

## 2016-05-27 NOTE — Progress Notes (Signed)
Pt ambulated in hallway with walker on room air. Room Air o2 sats with ambulation 95%

## 2016-05-27 NOTE — Anesthesia Preprocedure Evaluation (Addendum)
Anesthesia Evaluation  Patient identified by MRN, date of birth, ID band Patient awake    Reviewed: Allergy & Precautions, H&P , NPO status , Patient's Chart, lab work & pertinent test results  History of Anesthesia Complications (+) PONV  Airway Mallampati: II  TM Distance: >3 FB Neck ROM: Full    Dental no notable dental hx. (+) Teeth Intact, Dental Advisory Given   Pulmonary neg pulmonary ROS, former smoker,    Pulmonary exam normal breath sounds clear to auscultation       Cardiovascular hypertension, + dysrhythmias Atrial Fibrillation  Rhythm:Irregular Rate:Normal     Neuro/Psych negative neurological ROS  negative psych ROS   GI/Hepatic negative GI ROS, Neg liver ROS,   Endo/Other  negative endocrine ROS  Renal/GU negative Renal ROS  negative genitourinary   Musculoskeletal   Abdominal   Peds  Hematology negative hematology ROS (+) anemia ,   Anesthesia Other Findings   Reproductive/Obstetrics negative OB ROS                            Anesthesia Physical Anesthesia Plan  ASA: III  Anesthesia Plan: General   Post-op Pain Management:    Induction: Intravenous  Airway Management Planned: Mask  Additional Equipment:   Intra-op Plan:   Post-operative Plan:   Informed Consent: I have reviewed the patients History and Physical, chart, labs and discussed the procedure including the risks, benefits and alternatives for the proposed anesthesia with the patient or authorized representative who has indicated his/her understanding and acceptance.   Dental advisory given  Plan Discussed with: CRNA  Anesthesia Plan Comments:         Anesthesia Quick Evaluation

## 2016-05-27 NOTE — Anesthesia Postprocedure Evaluation (Signed)
Anesthesia Post Note  Patient: Lisa [name]  Procedure(s) Performed: Procedure(s) (LRB): CARDIOVERSION (N/A) TRANSESOPHAGEAL ECHOCARDIOGRAM (TEE) (N/A)  Patient location during evaluation: PACU Anesthesia Type: MAC Level of consciousness: awake and alert Pain management: pain level controlled Vital Signs Assessment: post-procedure vital signs reviewed and stable Respiratory status: spontaneous breathing, nonlabored ventilation and respiratory function stable Cardiovascular status: stable and blood pressure returned to baseline Anesthetic complications: no    Last Vitals:  Vitals:   05/27/16 1340 05/27/16 1350  BP: (!) 98/51 (!) 111/54  Pulse: (!) 56 (!) 56  Resp: (!) 23 (!) 22  Temp:      Last Pain:  Vitals:   05/27/16 1327  TempSrc: Oral  PainSc:                  Riccardo Holeman,W. EDMOND

## 2016-05-28 ENCOUNTER — Ambulatory Visit: Payer: Medicare Other

## 2016-05-28 ENCOUNTER — Ambulatory Visit: Payer: Medicare Other | Admitting: Radiation Oncology

## 2016-05-28 ENCOUNTER — Encounter (HOSPITAL_COMMUNITY): Payer: Self-pay | Admitting: Physician Assistant

## 2016-05-28 ENCOUNTER — Inpatient Hospital Stay (HOSPITAL_COMMUNITY): Payer: Medicare Other

## 2016-05-28 DIAGNOSIS — I313 Pericardial effusion (noninflammatory): Secondary | ICD-10-CM

## 2016-05-28 DIAGNOSIS — I480 Paroxysmal atrial fibrillation: Secondary | ICD-10-CM

## 2016-05-28 DIAGNOSIS — J90 Pleural effusion, not elsewhere classified: Secondary | ICD-10-CM

## 2016-05-28 DIAGNOSIS — I3139 Other pericardial effusion (noninflammatory): Secondary | ICD-10-CM

## 2016-05-28 DIAGNOSIS — C50911 Malignant neoplasm of unspecified site of right female breast: Secondary | ICD-10-CM

## 2016-05-28 LAB — CBC
HEMATOCRIT: 34.4 % — AB (ref 36.0–46.0)
Hemoglobin: 11.7 g/dL — ABNORMAL LOW (ref 12.0–15.0)
MCH: 32 pg (ref 26.0–34.0)
MCHC: 34 g/dL (ref 30.0–36.0)
MCV: 94 fL (ref 78.0–100.0)
PLATELETS: 234 10*3/uL (ref 150–400)
RBC: 3.66 MIL/uL — ABNORMAL LOW (ref 3.87–5.11)
RDW: 15.7 % — AB (ref 11.5–15.5)
WBC: 5 10*3/uL (ref 4.0–10.5)

## 2016-05-28 LAB — COMPREHENSIVE METABOLIC PANEL
ALBUMIN: 3.3 g/dL — AB (ref 3.5–5.0)
ALT: 16 U/L (ref 14–54)
AST: 23 U/L (ref 15–41)
Alkaline Phosphatase: 77 U/L (ref 38–126)
Anion gap: 8 (ref 5–15)
BUN: 38 mg/dL — AB (ref 6–20)
CHLORIDE: 108 mmol/L (ref 101–111)
CO2: 22 mmol/L (ref 22–32)
CREATININE: 1.22 mg/dL — AB (ref 0.44–1.00)
Calcium: 10.7 mg/dL — ABNORMAL HIGH (ref 8.9–10.3)
GFR calc Af Amer: 50 mL/min — ABNORMAL LOW (ref >60)
GFR calc non Af Amer: 43 mL/min — ABNORMAL LOW (ref >60)
GLUCOSE: 107 mg/dL — AB (ref 65–99)
POTASSIUM: 4.3 mmol/L (ref 3.5–5.1)
SODIUM: 138 mmol/L (ref 135–145)
Total Bilirubin: 1.1 mg/dL (ref 0.3–1.2)
Total Protein: 6 g/dL — ABNORMAL LOW (ref 6.5–8.1)

## 2016-05-28 LAB — INFLUENZA PANEL BY PCR (TYPE A & B)
INFLAPCR: NEGATIVE
Influenza B By PCR: NEGATIVE

## 2016-05-28 LAB — BRAIN NATRIURETIC PEPTIDE: B Natriuretic Peptide: 536.6 pg/mL — ABNORMAL HIGH (ref 0.0–100.0)

## 2016-05-28 MED ORDER — FUROSEMIDE 10 MG/ML IJ SOLN
40.0000 mg | Freq: Once | INTRAMUSCULAR | Status: AC
  Administered 2016-05-28: 40 mg via INTRAVENOUS
  Filled 2016-05-28: qty 4

## 2016-05-28 MED ORDER — LEVALBUTEROL HCL 1.25 MG/0.5ML IN NEBU
1.2500 mg | INHALATION_SOLUTION | Freq: Four times a day (QID) | RESPIRATORY_TRACT | Status: DC
  Filled 2016-05-28 (×2): qty 0.5

## 2016-05-28 MED ORDER — AMIODARONE HCL 200 MG PO TABS
200.0000 mg | ORAL_TABLET | Freq: Every day | ORAL | Status: DC
  Administered 2016-05-29: 200 mg via ORAL
  Filled 2016-05-28: qty 1

## 2016-05-28 MED ORDER — LEVALBUTEROL HCL 1.25 MG/0.5ML IN NEBU
1.2500 mg | INHALATION_SOLUTION | Freq: Four times a day (QID) | RESPIRATORY_TRACT | Status: DC
  Administered 2016-05-28 – 2016-05-30 (×8): 1.25 mg via RESPIRATORY_TRACT
  Filled 2016-05-28 (×10): qty 0.5

## 2016-05-28 MED ORDER — METOPROLOL TARTRATE 25 MG PO TABS
25.0000 mg | ORAL_TABLET | Freq: Two times a day (BID) | ORAL | Status: DC
  Administered 2016-05-28 – 2016-05-31 (×7): 25 mg via ORAL
  Filled 2016-05-28 (×7): qty 1

## 2016-05-28 MED ORDER — ACETYLCYSTEINE 20 % IN SOLN
3.0000 mL | Freq: Four times a day (QID) | RESPIRATORY_TRACT | Status: DC
  Administered 2016-05-28: 3 mL via RESPIRATORY_TRACT
  Administered 2016-05-28: 15:00:00 via RESPIRATORY_TRACT
  Administered 2016-05-29 – 2016-05-30 (×6): 3 mL via RESPIRATORY_TRACT
  Filled 2016-05-28 (×10): qty 4

## 2016-05-28 MED ORDER — FUROSEMIDE 10 MG/ML IJ SOLN
20.0000 mg | Freq: Once | INTRAMUSCULAR | Status: AC
  Administered 2016-05-28: 20 mg via INTRAVENOUS
  Filled 2016-05-28: qty 2

## 2016-05-28 NOTE — Progress Notes (Signed)
PROGRESS NOTE [facility]   Lisa [name]   [id number] DOB: 1941-06-17  DOA: 05/22/2016 PCP: [name], MD   Brief Narrative:  Lisa [name] is a 75 y.o. 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 difficile colitis, CKD-2, who presents with irregular heartbeat. Found to be A. fib with rapid ventricular response. Admitted started on Cardizem drip and a Eliquis. During the admission patient has been complaining of shortness of breath which was attributed to acute diastolic failure due to A. fib. Cardiology was consulted cardioversion was done patient tolerated, now in sinus normal rhythm. Patient continues to complain of shortness of breath, and cough.  Subjective: Status post cardioversion day 1, tolerated without complications, although remained short of breath and no increasing cough with some nasal congestion. Patient has nebulizer treatment which has helped. Shortness of breath and weakness mainly with sedation at rest doing okay. Trial of room air with saturations in the 90s. Checks x-ray from this morning shows mild pleural effusion.  Assessment & Plan:   New onset Atrial fibrillation with RVR [facility]): s/p TEE Cardioversion -  Back to sinus, on metoprolol and eliquis. - CHA2DS2-VASc Score is 3, TNIs negative, TSH normal -Appreciate cardiology's consultation and recommendations. -Continue Amio 400mg  BID - decrease to 200 daily, heart rate in the lower side, patient back to normal sinus rhythm -Metoprolol decrease it to 25 mg twice a day -Continue Eliquis 5 mg bid -Continue Telemetry   SOB 2/2 Acute diastolic failure due to a fib, now back to normal rhythm and symptom persist,  checks x-ray show some pleural effusion and atelectasis. An inflammatory process from metastasis could be a possibility. No signs of infection, although patient is immunosuppressed from chemotherapy. No pancytopenia or leukocytosis. Patient is afebrile. -We'll schedule nebs treatment around-the-clock every 6 hours -Mucomyst given patient reporting some mucus that cannot bring up -We'll give Lasix 40 mg IV -Flonase added -Flutter Valve  -Wean O2 as tolerated  -May need to consider antibiotic if symptoms persist. -Check for flu - patient refusing vaccine  HLD: LDL 54 -Continue home medications: Zocor  HTN:  -On Metoprolol 25 mg BID  -Monitor BP   Hypokalemia: Resolved -Monitor BMP   AoCKD-II: Baseline Cre 1.1 - slight increase likely due to aggressive diuresis  -Monitor renal function  -Lasix d/c'ed   Breast cancer metastasized to multiple sites [facility]): pt is followed up and treated by oncologist, Dr. Marko Plume and radiation oncologist, Dr. Sondra Come. Currently on chemo and XRT. -f/u with Dr. Marko Plume and Dr. Sondra Come  DVT prophylaxis: Eliquis Code Status: DNR  Family Communication: Family at bedside, plan discussed in details.  Disposition Plan: Possible discharge in the morning if shortness of breath has improved.   Consultants:   Cardiology   Oncology   Procedures:   ECHO ------------------------------------------------------------------- Study Conclusions  - Left ventricle: The cavity size was normal. Wall thickness was   normal. Systolic function was normal. The estimated ejection   fraction was in the range of 55% to 60%. Wall motion was normal;   there were no regional wall motion abnormalities. - Aortic valve: No evidence of vegetation. There was mild   regurgitation. - Mitral valve: No evidence of vegetation. There was moderate   regurgitation. - Left atrium: The atrium was mildly dilated. No evidence  of   thrombus in the atrial cavity or appendage. No  evidence of   thrombus in the appendage. - Right atrium: The atrium was dilated. - Atrial septum: No defect or patent foramen ovale was identified.   Echo contrast study showed no right-to-left atrial level shunt,   following an increase in RA pressure induced by provocative   maneuvers. - Pulmonary arteries: Systolic pressure was mildly increased. PA   peak pressure: 35 mm Hg (S). - Pericardium, extracardiac: A small, free-flowing pericardial   effusion was identified circumferential to the heart. The fluid   had no internal echoes.There was no evidence of hemodynamic   compromise.  Antimicrobials:  None    Objective: Vitals:   05/28/16 0908 05/28/16 0921 05/28/16 1001 05/28/16 1347  BP: 110/62   132/67  Pulse:   60 (!) 58  Resp:    19  Temp:    98 F (36.7 C)  TempSrc:    Oral  SpO2:  98%  100%  Weight:      Height:        Intake/Output Summary (Last 24 hours) at 05/28/16 1500 Last data filed at 05/28/16 1322  Gross per 24 hour  Intake             1080 ml  Output                0 ml  Net             1080 ml   Filed Weights   05/22/16 1716 05/28/16 0512  Weight: 73.3 kg (161 lb 8 oz) 75.2 kg (165 lb 11.2 oz)    Examination:  General exam: Mild anxious Respiratory system: Good air entry, wheezing bilaterally in the upper lobes. Minimal rales at the left lower base  Cardiovascular system: S1 & S2 heard, RRR . No JVD, murmurs, rubs or gallops Gastrointestinal system: Abdomen is nondistended, soft and nontender. Extremities: No pedal edema.    Skin: No rashes, lesions or ulcers    Data Reviewed: I have personally reviewed following labs and imaging studies  CBC:  Recent Labs Lab 05/22/16 1734 05/23/16 0304 05/24/16 0452 05/28/16 0457  WBC 2.2* 2.9* 3.3* 5.0  NEUTROABS 1.0*  --  1.9  --   HGB 11.6* 11.4* 11.7* 11.7*  HCT 33.8* 33.6* 35.1* 34.4*  MCV 91.4 94.4 96.2 94.0  PLT 251 291 274 [id number]   Basic Metabolic Panel:  Recent Labs Lab 05/22/16 2138   05/24/16 0452 05/25/16 0407 05/26/16 0555 05/27/16 0437 05/28/16 0457  NA  --   < > 140 140 140 138 138  K  --   < > 3.8 3.6 3.0* 3.9 4.3  CL  --   < > 110 107 107 110 108  CO2  --   < > 23 25 24 22 22   GLUCOSE  --   < > 126* 116* 108* 113* 107*  BUN  --   < > 23* 27* 39* 38* 38*  CREATININE  --   < > 1.09* 1.13* 1.18* 1.18* 1.22*  CALCIUM  --   < > 9.3 9.4 9.4 10.3 10.7*  MG 2.0  --  2.0 2.0 2.0  --   --   < > = values in this interval not displayed. GFR: Estimated Creatinine Clearance: 39.5 mL/min (by C-G formula based on SCr of 1.22 mg/dL (H)). Liver Function Tests:  Recent Labs Lab 05/28/16 0457  AST 23  ALT 16  ALKPHOS 77  BILITOT 1.1  PROT 6.0*  ALBUMIN 3.3*   No results for input(s): LIPASE, AMYLASE in the last 168 hours. No results for input(s): AMMONIA in the last 168 hours. Coagulation Profile: No results for input(s): INR, PROTIME in the last 168 hours. Cardiac Enzymes:  Recent Labs Lab 05/22/16 2138 05/23/16 0304 05/23/16 0952  TROPONINI <0.03 <0.03 <0.03   BNP (last 3 results) No results for input(s): PROBNP in the last 8760 hours. HbA1C: No results for input(s): HGBA1C in the last 72 hours. CBG: No results for input(s): GLUCAP in the last 168 hours. Lipid Profile: No results for input(s): CHOL, HDL, LDLCALC, TRIG, CHOLHDL, LDLDIRECT in the last 72 hours. Thyroid Function Tests: No results for input(s): TSH, T4TOTAL, FREET4, T3FREE, THYROIDAB in the last 72 hours. Anemia Panel: No results for input(s): VITAMINB12, FOLATE, FERRITIN, TIBC, IRON, RETICCTPCT in the last 72 hours. Sepsis Labs: No results for input(s): PROCALCITON, LATICACIDVEN in the last 168 hours.  No results found for this or any previous visit (from the past 240 hour(s)).    Radiology Studies: Dg Chest Port 1 View  Result Date: 05/28/2016 CLINICAL DATA:  Cough. EXAM: PORTABLE CHEST 1 VIEW COMPARISON:  05/24/2016.  PET-CT 12/22/2015 FINDINGS: Cardiomegaly with mild pulmonary  venous congestion and mild interstitial prominence. Mild component congestive heart for on today's exam cannot be excluded . Mild left mid lung field subsegmental atelectasis again noted. Pulmonary nodular densities previously identified are best identified on prior PET-CT. Persistent bilateral pleural effusions, right side greater than left. Surgical clips right chest. Prior thoracolumbar spine fusion . IMPRESSION: 1. Cannot exclude a mild component congestive heart failure on today's exam. 2. Persistent mild left mid lung field subsegmental atelectasis. Persistent bilateral pleural effusions right side greater than left. Known pulmonary nodules are best identified on prior PET-CT. Electronically Signed   By: Marcello Moores  Register   On: 05/28/2016 09:58     Scheduled Meds: . acetylcysteine  3 mL Nebulization Q6H  . amiodarone  400 mg Oral BID  . apixaban  5 mg Oral BID  . fluticasone  1 spray Each Nare BID  . furosemide  40 mg Intravenous Once  . levalbuterol  1.25 mg Nebulization Q6H  . loratadine  10 mg Oral Daily  . metoprolol tartrate  25 mg Oral BID  . simvastatin  20 mg Oral QHS  . vitamin B-12  1,000 mcg Oral BID  . vitamin C  500 mg Oral Daily  . vitamin E  200 Units Oral Daily   Continuous Infusions:    LOS: 6 days    Chipper Oman, MD [facility] Pager [phone]  If 7PM-7AM, please contact night-coverage [email or web address] Password [email or web address] 05/28/2016, 3:00 PM

## 2016-05-28 NOTE — Progress Notes (Signed)
Patient Name: Lisa [name] Date of Encounter: 05/28/2016  Primary Cardiologist: [name] Problem List     Principal Problem:   Atrial fibrillation with RVR ([place]) Active Problems:   HLD (hyperlipidemia)   Essential hypertension   Bone metastases (HCC)   Hypokalemia   Acute kidney injury superimposed on CKD ([place])   Breast cancer metastasized to multiple sites ([place])   SOB (shortness of breath)   Acute diastolic heart failure (HCC)    Subjective   Has been up coughing all night, got about 2 hours of sleep. No palpitations, CP or dyspnea. Maintaining NSR. States she's had a cough since January.  Inpatient Medications    . amiodarone  400 mg Oral BID  . apixaban  5 mg Oral BID  . fluticasone  1 spray Each Nare BID  . loratadine  10 mg Oral Daily  . metoprolol tartrate  25 mg Oral BID  . simvastatin  20 mg Oral QHS  . vitamin B-12  1,000 mcg Oral BID  . vitamin C  500 mg Oral Daily  . vitamin E  200 Units Oral Daily    Vital Signs    Vitals:   05/27/16 2132 05/27/16 2203 05/28/16 0512 05/28/16 0800  BP: 126/81  114/75 (!) 126/54  Pulse: 61  (!) 52 (!) 58  Resp: (!) 25  (!) 22 (!) 21  Temp: 98.2 F (36.8 C)  98.2 F (36.8 C) 98.2 F (36.8 C)  TempSrc: Oral  Oral Oral  SpO2: 99% 99% 99% 99%  Weight:   165 lb 11.2 oz (75.2 kg)   Height:        Intake/Output Summary (Last 24 hours) at 05/28/16 0825 Last data filed at 05/27/16 1700  Gross per 24 hour  Intake              340 ml  Output                0 ml  Net              340 ml   Filed Weights   05/22/16 1716 05/28/16 0512  Weight: 161 lb 8 oz (73.3 kg) 165 lb 11.2 oz (75.2 kg)    Physical Exam    General: Well developed, well nourished WF, in no acute distress. HEENT: Normocephalic, atraumatic, sclera non-icteric, no xanthomas, nares are without discharge. Neck: Negative for carotid bruits. JVP not elevated. Lungs: Coarse BS bilaterally throughout. Breathing is unlabored. Cardiac: RRR  S1 S2 without murmurs, rubs, or gallops.  Abdomen: Soft, non-tender, non-distended with normoactive bowel sounds. No rebound/guarding. Extremities: No clubbing or cyanosis. Trace sockline edema. Distal pedal pulses are 2+ and equal bilaterally. Skin: Warm and dry, no significant rash. Neuro: Alert and oriented X 3. Sensation in tact. Follows commands. Psych:  Responds to questions appropriately with a normal affect.  Labs    CBC  Recent Labs  05/28/16 0457  WBC 5.0  HGB 11.7*  HCT 34.4*  MCV 94.0  PLT Q000111Q   Basic Metabolic Panel  Recent Labs  05/26/16 0555 05/27/16 0437 05/28/16 0457  NA 140 138 138  K 3.0* 3.9 4.3  CL 107 110 108  CO2 24 22 22   GLUCOSE 108* 113* 107*  BUN 39* 38* 38*  CREATININE 1.18* 1.18* 1.22*  CALCIUM 9.4 10.3 10.7*  MG 2.0  --   --    Liver Function Tests  Recent Labs  05/28/16 0457  AST 23  ALT 16  ALKPHOS  23  BILITOT 1.1  PROT 6.0*  ALBUMIN 3.3*   Telemetry    NSR  Radiology    Dg Chest 2 View  Result Date: 05/22/2016 CLINICAL DATA:  Recheck heart rate, irregular pulse EXAM: CHEST  2 VIEW COMPARISON:  05/02/2016, 08/02/2015 FINDINGS: Postsurgical changes of the right breast/axilla. Posterior stabilization rod and fixating screws within the spine. Multiple thoracic compression deformities at the lower thoracic spine, grossly unchanged. Diffuse interstitial opacities likely reflect chronic change. Left mid lung zone airspace opacity. Tiny bilateral pleural effusions or CP angle thickening. Stable cardiomediastinal silhouette. No pneumothorax. IMPRESSION: 1. Oval opacity left mid lung zone could relate to an area of inflammatory infiltrate. 2. Diffuse interstitial opacities, likely reflecting chronic change. Tiny bilateral effusions or pleural thickening. 3. Known metastatic pulmonary nodules are better appreciated on recent PET-CT. 4. Moderate severe compression deformities of the lower thoracic spine, grossly unchanged. Electronically  Signed   By: Donavan Foil M.D.   On: 05/22/2016 18:49   Ct Head W & Wo Contrast  Result Date: 05/23/2016 CLINICAL DATA:  75 year old hypertensive female with metastatic breast cancer with ongoing chemotherapy. Headaches. Initial encounter. EXAM: CT HEAD WITHOUT AND WITH CONTRAST TECHNIQUE: Contiguous axial images were obtained from the base of the skull through the vertex without and with intravenous contrast CONTRAST:  67mL ISOVUE-300 IOPAMIDOL (ISOVUE-300) INJECTION 61% COMPARISON:  No comparison head CT or brain MR. PET CT 05/02/2006. FINDINGS: Brain: No parenchymal enhancing lesion. No CT evidence of large acute infarct. No intracranial hemorrhage. Global atrophy without hydrocephalus. Vascular: Vascular calcifications. Skull: 1 cm left frontal calvarial lucency may represent metastatic disease. Slightly sclerotic appearance of the right C1 ring may represent metastatic disease. Sinuses/Orbits: No acute orbital abnormality. Visualized sinuses and mastoid air cells/ middle ear cavities are clear. Other: Negative. IMPRESSION: No parenchymal enhancing lesion. 1 cm left frontal calvarial lucency may represent metastatic disease. Slightly sclerotic appearance of the right C1 ring may represent metastatic disease. No CT evidence of large acute infarct. Global atrophy. Electronically Signed   By: Genia Del M.D.   On: 05/23/2016 09:58   Nm Pet Image Restag (ps) Skull Base To Thigh  Result Date: 05/02/2016 CLINICAL DATA:  Subsequent treatment strategy for metastatic breast cancer. EXAM: NUCLEAR MEDICINE PET SKULL BASE TO THIGH TECHNIQUE: 8.21 mCi F-18 FDG was injected intravenously. Full-ring PET imaging was performed from the skull base to thigh after the radiotracer. CT data was obtained and used for attenuation correction and anatomic localization. FASTING BLOOD GLUCOSE:  Value: 102 mg/dl COMPARISON:  PET-CT 12/22/2015. FINDINGS: NECK No hypermetabolic lymph nodes in the neck. However, there is some  poorly defined infiltrative appearing soft tissue haziness in the in lower neck adjacent to the thyroid gland which is diffusely hypermetabolic (SUVmax = 6.3). CHEST Numerous pulmonary nodules are again noted throughout the lungs bilaterally, generally increased in size and number compared to the prior examination, with the largest of these nodules in the right lung associated with the right major fissure (image 31 of series 7) measuring 13 x 16 mm (SUVmax = 29.1). Large partially calcified pleural-based mass in the inferior aspect of the right lower lobe appear is similar to the prior examination, and is again hypermetabolic (SUVmax = [id number]). Several hypermetabolic bilateral hilar and mediastinal lymph nodes are noted, with the most hypermetabolism in a subcarinal lymph node (SUVmax = 8.1), which appears borderline enlarged measuring approximately 8 mm. Mild cardiomegaly. There is aortic atherosclerosis, as well as atherosclerosis of the great vessels of the mediastinum and  the coronary arteries, including calcified atherosclerotic plaque in the left anterior descending and right coronary arteries. Calcifications of the aortic valve. Small left pleural effusion. Mild diffuse interlobular septal thickening in the lungs, favored to reflect a background of very mild interstitial pulmonary edema. ABDOMEN/PELVIS There are some patchy areas of heterogeneous increased metabolic activity in the liver, most of which have no discrete CT correlate. However, there are some tiny capsular areas overlying the right lobe of the liver that are partially calcified (image 93 of series 4) which correspond to hypermetabolic lesions (SUVmax = 9.6)on the PET portion of the examination, concerning for hepatic metastasis. No abnormal hypermetabolic activity within the pancreas, adrenal glands, or spleen. In the distal right common iliac nodal chain there is an enlarged lymph node measuring 12 mm in short axis (image 134 of series 4) which  is hypermetabolic (SUVmax = 6.9), similar to the prior examination. Low-attenuation lesion in the lower pole of the left kidney is incompletely characterized, but presumably a parapelvic cysts, similar to the prior study. No significant volume of ascites. No pneumoperitoneum. No pathologic dilatation of small bowel or colon. Status post hysterectomy. Ovaries are not confidently identified may be surgically absent or atrophic. SKELETON Innumerable hypermetabolic lesions are noted throughout the visualized axial and appendicular skeleton. Many of these are old lesions seen on prior examinations, however, these generally appear increased in size and number. Specific examples include new lesions in the right skull base just lateral to the foramen magnum (SUVmax = 10.8), a much larger hypermetabolic lesion (SUVmax = 20.0)which is mixed lytic and sclerotic in the right scapular glenoid(image 44 of series 4), and persistent increase in areas of irregular lucency and sclerosis throughout the sacrum which are diffusely hypermetabolic (SUVmax = Q000111Q). Multiple other osseous lesions are also noted. Orthopedic fixation hardware in the lower thoracic and upper lumbar spine again noted. Surgical clips in the right axillary region from prior lymph node dissection. Status post right modified radical mastectomy. IMPRESSION: 1. Today's study demonstrates general progression of disease, as evidenced by increased number and size of numerous osseous lesions and pulmonary lesions, as discussed above. 2. Additional incidental findings, as above. Electronically Signed   By: Vinnie Langton M.D.   On: 05/02/2016 14:43   Dg Chest Port 1 View  Result Date: 05/24/2016 CLINICAL DATA:  Shortness of breath on exertion, history of breast carcinoma EXAM: PORTABLE CHEST 1 VIEW COMPARISON:  05/22/2016 FINDINGS: Cardiac shadow is mildly enlarged. Postsurgical changes are again seen and stable. Increasing right-sided pleural effusion is noted. The  known right basilar mass lesion is not well appreciated on this study. Small left effusion is noted as well. There remain some mild increased density in the left mid lung related to focal scarring. IMPRESSION: Increasing bilateral pleural effusions right greater than left. The known right basilar mass is not well appreciated on this exam. Electronically Signed   By: Inez Catalina M.D.   On: 05/24/2016 16:38     Patient Profile     12F with HTN, longstanding history of metastatic breast cancer (involving skeleton and lungs with PET scan 04/2016 showing increased number of size of pulm/bone lesions), hyperlipidemia, CKD stage III admitted with newly recognized atrial fib RVR and suspected acute diastolic CHF due to AF. Developed AKI on CKD with hypokalemia with diuresis, thus Lasix discontinued. 2D Echo 05/23/16: EF 55-60%, no RWMA, mod Mr, mild LAE/RAE, PASP 99mmHg.   Assessment & Plan    1. Persistent atrial fib - s/p successful TEE/DCCV at  [facility] yesterday. MD to advise on amiodarone dose de-escalation going forward - has been on 400mg  BID since 05/24/16.  2. HTN - controlled.  3. Coronary calcium seen on PET scan 04/2016 - asymptomatic. Continue to observe for any angina.  4. Acute diastolic CHF - f/u CXR pending - developed URI sx overnight.  5. CKD stage III - actually appears stage 3 by GFR and stable. Baseline in latter 2017 appears 1.1-1.3.  6. Small pericardial effusion by TEE - follow clinically as OP. No evidence of hemodynamic compromise.  Follow-up: arranged in Afib clinic for first post-hospital visit on 06/03/16. At that visit, can arrange follow-up with Dr. Sallyanne Kuster thereafter.  Signed, Charlie Pitter, PA-C  05/28/2016, 8:25 AM   The patient has been seen in conjunction with Melina Copa, PAC. All aspects of care have been considered and discussed. The patient has been personally interviewed, examined, and all clinical data has been reviewed.   I suspect post cardioversion  pulmonary edema.  IV diuresis.Check BNP. Consider acute amiodarone pulmonary toxicity if rapid improvwement fails to occur with lasix.

## 2016-05-28 NOTE — Progress Notes (Signed)
Medical Oncology May 28, 2016, 8:28 AM  Hospital day 7 Antibiotics: none presently MAY NEED TO START Chemotherapy: last weekly taxol given 05-16-16 Last zometa 04-01-16 Radiation to right hip area held since admission Eliquis begun 05-23-16  Outpatient physicians:  J.Kinard, Philmore Pali (PCP), (R.Sharma), A.Kendall. K.Cabbell. M.Croitoru  Successful cardioversion on 05-27-16  Subjective: Tolerated cardioversion without complications. Cannot tell any improvement in symptoms since back in sinus rhythm due to new cough. New and worsening cough/ lower respiratory congestion since 05-27-16. Cough productive of clear sputum, neb somewhat helpful x 1 last pm, unable to sleep last pm due to coughing. SOB and weak with exertion. Bowels ok. No esophagitis, some upper respiratory drainage. No aches. Pain from metastatic bone involvement not bothersome with this activity. No LE swelling. No bleeding. Appetite good, ate last pm and full breakfast this AM. No family here presently  NOTE no flu vaccine, as patient has declined to date  ONCOLOGIC HISTORY Right breast carcinoma T2 N1, ER/PR-positive December 1998, treated with mastectomy with 12 node axillary evaluation and adjuvant Adriamycin/Cytoxan followed by 5 years of tamoxifen through December 2003. 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 CA 2729 was 434 in April 2010. She has tolerated Femara well and clinically has continued to do very well, though CA 27.29 has never normalized. 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 Patient seen by medical oncology 08-10-15 and radiation begun shortly afterwards by Dr Sondra Come, 22.5 of planned 35 Gy radiation to area of T10 thru 08-29-15. She had first zometa 08-11-15. MRI T spine obtained after some  difficulty on 08-29-15, with pathologic compression fracture T10 with retropulsion of compressed vertebra to right with compression of cord called, involvement also T9, T11, L2. She had thoracic six- lumbar one posterolateral arthrodesis, T10 laminectomy, partial laminectomy T9, T11 by Dr Christella Noa on 09-01-15. She was begun on Aromasin on 09-07-15.  PET had been scheduled for 09-04-15, delayed with surgery, accomplished on 09-20-15 with chest adenopathy, increased soft tissue right lung base, extensive bone involvement.  Repeat PET 12-22-15 some improvement in area of radiation T spine, otherwise no improvement in bones, some increase right femur, some central chest adenopathy and right lower chest/ pleural involvement. Aromasin was discontinued and xeloda begun 01-05-16, used thru 04-29-16.  BRCA negative by [facility] 28 gene panel by [facility] 03-2016. Progressed in lung and bones by PET 05-02-16. Weekly taxol began 05-09-16.Radiation to right pelvis / hip begun ~ 05-21-16.     Objective: Vital signs in last 24 hours: Blood pressure (!) 126/54, pulse (!) 58, temperature 98.2 F (36.8 C), temperature source Oral, resp. rate (!) 21, height 5' 2"  (1.575 m), weight 165 lb 11.2 oz (75.2 kg), SpO2 99 %. Awake, alert, talkative, some cough and audible congestion. Looks mildly ill. Oral mucosa moist and clear. Lungs with rhonchi and crackles, slight expiratory wheezing mostly left chest anteriorly and bilateral posterior fields. Heart RRR. Abdomen soft, not tender, some normal BS. LE no edema,, cords, tenderness. NSL LUE site ok. Turns to side in bed and moves all extremities easily. Feet warm. Speech fluent,, no focal neuro deficits. No central catheter  Intake/Output from previous day: 11/06 0701 - 11/07 0700 In: 340 [P.O.:240; I.V.:100] Out: -  Intake/Output this shift: No intake/output data recorded.   Lab Results:  Recent Labs  05/28/16 0457  WBC 5.0  HGB 11.7*  HCT 34.4*  PLT 234   BMET  Recent  Labs  05/27/16 0437 05/28/16 0457  NA 138 138  K 3.9 4.3  CL 110 108  CO2 22 22  GLUCOSE 113* 107*  BUN 38* 38*  CREATININE 1.18* 1.22*  CALCIUM 10.3 10.7*    Studies/Results: Portable CXR ordered now  Assessment/Plan: 1. Post successful cardioversion of atrial fibrillation 05-27-16: remains on Eliquis, amiodarone, metoprolol.  A fib with rapid ventricular response: may have been intermittent in last 2 weeks PTA.  2.New lower respiratory congestion and cough in past 24 hours. I have requested xopenex neb and ordered CXR; primary service to round soon and may need to consider antibiotics. Note she has not had flu vaccine. Immunocompromised from all recent chemo, tho not neutropenic.  May need to delay planned DC today.  4. progressive metastatic breast cancer lungs andextensive bone involvement by PET 05-02-16: treatment changed to weekly taxol beginning 05-09-16. Will resume taxol at [facility] after DC when stable. Palliative radiation to right iliac area begun 10-31, planned ~ 10 treatments. Pain controlled, probably best not to transport to radiation onc with the respiratory symptoms now.  Prior to present taxol, no significant improvement on Aromasin x 4 months thru 01-02-16 and progression on xeloda 12-2015 thru 04-29-16. Last zometa 04-01-16, held with increased creatinine in Oct. .Pathologic compression fracture T10 and retropulsion of bone impinging on spinal cord for which she had T6 - L1 posterolateral arthrodesis with T10 laminectomy and T9/ T11 partial laminectomy by Dr Christella Noa on 2-10-1. Previous radiation to right hip and right femur helpful. 5. Previous hypercalcemia: follow, no IV bisphosphonate since 04-01-16 due to more elevated creatinine  6.needs flu vaccine, tho has refused to date 7.diarrhea Jan treated for C diff ( Ab + toxin -) and resolved. 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   10.Advance Directives in place 11. Deconditioning with acute problems this week  Lisa [name]

## 2016-05-28 NOTE — Progress Notes (Signed)
Physical Therapy Treatment Patient Details Name: Lisa [name] MRN: [id number] DOB: October 27, 1940 Today's Date: 05/28/2016    History of Present Illness This 75 y.o. female admitted from radiation/oncology office for irregular heartbeat and chest discomfort.  She was found to have A-Fib with RVR.  PMH includes metastatic breast CA with mets to pelvis and spine - she is currently taking chemo and receiving radiation; HTN, h/o C-Diff, CKD-2.      PT Comments    Pt did well with PT session despite reporting feeling weak today.  No SOB noted with gait and o2 was 93% on room air after gait.  Will continue to follow.  Follow Up Recommendations  No PT follow up     Equipment Recommendations  None recommended by PT    Recommendations for Other Services       Precautions / Restrictions Precautions Precautions: Fall Restrictions Weight Bearing Restrictions: No    Mobility  Bed Mobility Overal bed mobility: Modified Independent                Transfers Overall transfer level: Needs assistance Equipment used: Rolling walker (2 wheeled) Transfers: Sit to/from Stand Sit to Stand: Supervision         General transfer comment: supervision for safety  Ambulation/Gait Ambulation/Gait assistance: Min guard Ambulation Distance (Feet): 140 Feet Assistive device: Rolling walker (2 wheeled) Gait Pattern/deviations: Step-through pattern     General Gait Details: Focused on a smoother gait pattern as pt was pushing RW, then step, step, but with cues was able to make smoother gait. 2 short standing rest breaks.  RA o2 93% at end of gait   Stairs            Wheelchair Mobility    Modified Rankin (Stroke Patients Only)       Balance Overall balance assessment: Needs assistance Sitting-balance support: Feet supported Sitting balance-Leahy Scale: Good       Standing balance-Leahy Scale: Fair                      Cognition Arousal/Alertness:  Awake/alert Behavior During Therapy: WFL for tasks assessed/performed Overall Cognitive Status: Within Functional Limits for tasks assessed                      Exercises      General Comments        Pertinent Vitals/Pain Pain Assessment: No/denies pain    Home Living                      Prior Function            PT Goals (current goals can now be found in the care plan section) Acute Rehab PT Goals PT Goal Formulation: With patient Time For Goal Achievement: 06/01/16 Potential to Achieve Goals: Good Progress towards PT goals: Progressing toward goals    Frequency    Min 3X/week      PT Plan Current plan remains appropriate    Co-evaluation             End of Session Equipment Utilized During Treatment: Gait belt Activity Tolerance: Patient tolerated treatment well Patient left: in chair;with call bell/phone within reach     Time: 1142-1158 PT Time Calculation (min) (ACUTE ONLY): 16 min  Charges:  $Gait Training: 8-22 mins                    G Codes:  [place] Huberty LUBECK 05/28/2016, 12:46 PM

## 2016-05-29 ENCOUNTER — Ambulatory Visit: Payer: Medicare Other

## 2016-05-29 ENCOUNTER — Telehealth: Payer: Self-pay | Admitting: Oncology

## 2016-05-29 DIAGNOSIS — J90 Pleural effusion, not elsewhere classified: Secondary | ICD-10-CM

## 2016-05-29 DIAGNOSIS — E785 Hyperlipidemia, unspecified: Secondary | ICD-10-CM

## 2016-05-29 DIAGNOSIS — E876 Hypokalemia: Secondary | ICD-10-CM

## 2016-05-29 DIAGNOSIS — R0602 Shortness of breath: Secondary | ICD-10-CM

## 2016-05-29 LAB — DIFFERENTIAL
BASOS ABS: 0 10*3/uL (ref 0.0–0.1)
BASOS PCT: 1 %
EOS ABS: 0 10*3/uL (ref 0.0–0.7)
Eosinophils Relative: 1 %
Lymphocytes Relative: 17 %
Lymphs Abs: 0.7 10*3/uL (ref 0.7–4.0)
MONO ABS: 0.8 10*3/uL (ref 0.1–1.0)
MONOS PCT: 20 %
Neutro Abs: 2.5 10*3/uL (ref 1.7–7.7)
Neutrophils Relative %: 61 %

## 2016-05-29 LAB — BASIC METABOLIC PANEL
Anion gap: 6 (ref 5–15)
BUN: 34 mg/dL — AB (ref 6–20)
CO2: 26 mmol/L (ref 22–32)
CREATININE: 1.28 mg/dL — AB (ref 0.44–1.00)
Calcium: 10.5 mg/dL — ABNORMAL HIGH (ref 8.9–10.3)
Chloride: 108 mmol/L (ref 101–111)
GFR calc Af Amer: 47 mL/min — ABNORMAL LOW (ref >60)
GFR, EST NON AFRICAN AMERICAN: 41 mL/min — AB (ref >60)
GLUCOSE: 102 mg/dL — AB (ref 65–99)
POTASSIUM: 3.3 mmol/L — AB (ref 3.5–5.1)
Sodium: 140 mmol/L (ref 135–145)

## 2016-05-29 LAB — CBC
HEMATOCRIT: 34.8 % — AB (ref 36.0–46.0)
Hemoglobin: 11.7 g/dL — ABNORMAL LOW (ref 12.0–15.0)
MCH: 32.4 pg (ref 26.0–34.0)
MCHC: 33.6 g/dL (ref 30.0–36.0)
MCV: 96.4 fL (ref 78.0–100.0)
PLATELETS: 237 10*3/uL (ref 150–400)
RBC: 3.61 MIL/uL — ABNORMAL LOW (ref 3.87–5.11)
RDW: 15.7 % — AB (ref 11.5–15.5)
WBC: 3.9 10*3/uL — ABNORMAL LOW (ref 4.0–10.5)

## 2016-05-29 MED ORDER — POTASSIUM CHLORIDE CRYS ER 20 MEQ PO TBCR
40.0000 meq | EXTENDED_RELEASE_TABLET | Freq: Once | ORAL | Status: AC
  Administered 2016-05-29: 40 meq via ORAL
  Filled 2016-05-29: qty 2

## 2016-05-29 MED ORDER — FUROSEMIDE 10 MG/ML IJ SOLN
40.0000 mg | Freq: Once | INTRAMUSCULAR | Status: AC
Start: 2016-05-29 — End: 2016-05-29
  Administered 2016-05-29: 40 mg via INTRAVENOUS
  Filled 2016-05-29: qty 4

## 2016-05-29 MED ORDER — AMIODARONE HCL 200 MG PO TABS
200.0000 mg | ORAL_TABLET | Freq: Two times a day (BID) | ORAL | Status: DC
  Administered 2016-05-29 – 2016-05-31 (×4): 200 mg via ORAL
  Filled 2016-05-29 (×4): qty 1

## 2016-05-29 MED ORDER — POTASSIUM CHLORIDE CRYS ER 20 MEQ PO TBCR
20.0000 meq | EXTENDED_RELEASE_TABLET | Freq: Once | ORAL | Status: AC
  Administered 2016-05-29: 20 meq via ORAL
  Filled 2016-05-29: qty 1

## 2016-05-29 NOTE — Care Management Note (Signed)
Case Management Note  Patient Details  Name: Lisa [name] MRN: [id number] Date of Birth: 25-Jun-1941  Subjective/Objective: Noted 02 sats on RA, no PT f/u. No CM needs.                   Action/Plan:d/c home.   Expected Discharge Date:   (unknown)               Expected Discharge Plan:  Home/Self Care  In-House Referral:     Discharge planning Services  CM Consult  Post Acute Care Choice:    Choice offered to:     DME Arranged:    DME Agency:     HH Arranged:    HH Agency:     Status of Service:  In process, will continue to follow  If discussed at Long Length of Stay Meetings, dates discussed:    Additional Comments:  Dessa Phi, RN 05/29/2016, 3:31 PM

## 2016-05-29 NOTE — Telephone Encounter (Addendum)
Received message that Lisa [name] does not want treatment today.  She said that Lisa [name] had told her and her family that she should not have chemotherapy or radiation while taking Eliquis.  Discussed with Lisa [name] who said that patients can have radiation while taking Eliquis.  Called Lisa [name] and let her know this but she said she is now scared to have radiation.  Advised her that we will cancel radiation today and to discuss this with the cardiologist that rounds today.  Notified [facility] of cancellation.

## 2016-05-29 NOTE — Progress Notes (Addendum)
Patient Name: Lisa [name] Date of Encounter: 05/29/2016  Primary Cardiologist: Dr. [name] Problem List     Principal Problem:   Atrial fibrillation with RVR [facility]) Active Problems:   HLD (hyperlipidemia)   Essential hypertension   Bone metastases (HCC)   Hypokalemia   Acute kidney injury superimposed on CKD ([place])   Breast cancer metastasized to multiple sites ([place])   SOB (shortness of breath)   Acute diastolic heart failure (HCC)   Pericardial effusion - small by TEE 05/27/16   Pleural effusion   Paroxysmal atrial fibrillation (HCC)    Subjective   Breathing significantly improved. Ambulated with PT this AM with oxygen saturations remaining stable at 93-94%. Denies any chest discomfort or palpitations.   Inpatient Medications    Scheduled Meds: . acetylcysteine  3 mL Nebulization Q6H  . amiodarone  200 mg Oral Daily  . apixaban  5 mg Oral BID  . fluticasone  1 spray Each Nare BID  . levalbuterol  1.25 mg Nebulization Q6H  . loratadine  10 mg Oral Daily  . metoprolol tartrate  25 mg Oral BID  . simvastatin  20 mg Oral QHS  . vitamin B-12  1,000 mcg Oral BID  . vitamin C  500 mg Oral Daily  . vitamin E  200 Units Oral Daily   Continuous Infusions:  PRN Meds: ALPRAZolam, ondansetron, traMADol, zolpidem   Vital Signs    Vitals:   05/29/16 0633 05/29/16 0833 05/29/16 0849 05/29/16 1020  BP: 122/60     Pulse: (!) 57   64  Resp: (!) 22     Temp: 98 F (36.7 C)     TempSrc: Oral     SpO2: 99% 94% 94%   Weight: 153 lb 4.8 oz (69.5 kg)     Height:        Intake/Output Summary (Last 24 hours) at 05/29/16 1042 Last data filed at 05/29/16 0900  Gross per 24 hour  Intake             1200 ml  Output             1500 ml  Net             -300 ml   Filed Weights   05/22/16 1716 05/28/16 0512 05/29/16 [id number]  Weight: 161 lb 8 oz (73.3 kg) 165 lb 11.2 oz (75.2 kg) 153 lb 4.8 oz (69.5 kg)    Physical Exam    GEN: Well nourished, well developed,  Caucasian female appearing in no acute distress.  HEENT: Grossly normal.  Neck: Supple, no JVD, carotid bruits, or masses. Cardiac: RRR, no murmurs, rubs, or gallops. No clubbing, cyanosis, edema.  Radials/DP/PT 2+ and equal bilaterally.  Respiratory:  Respirations regular and unlabored, diffuse rhonchi throughout lung fields. GI: Soft, nontender, nondistended, BS + x 4. MS: no deformity or atrophy. Skin: warm and dry, no rash. Neuro:  Strength and sensation are intact. Psych: AAOx3.  Normal affect.  Labs    CBC  Recent Labs  05/28/16 0457 05/29/16 0453  WBC 5.0 3.9*  NEUTROABS  --  2.5  HGB 11.7* 11.7*  HCT 34.4* 34.8*  MCV 94.0 96.4  PLT 234 [id number]   Basic Metabolic Panel  Recent Labs  05/28/16 0457 05/29/16 0453  NA 138 140  K 4.3 3.3*  CL 108 108  CO2 22 26  GLUCOSE 107* 102*  BUN 38* 34*  CREATININE 1.22* 1.28*  CALCIUM 10.7* 10.5*   Liver Function  Tests  Recent Labs  05/28/16 0457  AST 23  ALT 16  ALKPHOS 77  BILITOT 1.1  PROT 6.0*  ALBUMIN 3.3*   No results for input(s): LIPASE, AMYLASE in the last 72 hours. Cardiac Enzymes No results for input(s): CKTOTAL, CKMB, CKMBINDEX, TROPONINI in the last 72 hours. BNP Invalid input(s): POCBNP D-Dimer No results for input(s): DDIMER in the last 72 hours. Hemoglobin A1C No results for input(s): HGBA1C in the last 72 hours. Fasting Lipid Panel No results for input(s): CHOL, HDL, LDLCALC, TRIG, CHOLHDL, LDLDIRECT in the last 72 hours. Thyroid Function Tests No results for input(s): TSH, T4TOTAL, T3FREE, THYROIDAB in the last 72 hours.  Invalid input(s): FREET3  Telemetry    NSR, HR in mid-50's - 60's.  - Personally Reviewed  ECG    No new tracings.   Radiology    Dg Chest Port 1 View  Result Date: 05/28/2016 CLINICAL DATA:  Cough. EXAM: PORTABLE CHEST 1 VIEW COMPARISON:  05/24/2016.  PET-CT 12/22/2015 FINDINGS: Cardiomegaly with mild pulmonary venous congestion and mild interstitial prominence.  Mild component congestive heart for on today's exam cannot be excluded . Mild left mid lung field subsegmental atelectasis again noted. Pulmonary nodular densities previously identified are best identified on prior PET-CT. Persistent bilateral pleural effusions, right side greater than left. Surgical clips right chest. Prior thoracolumbar spine fusion . IMPRESSION: 1. Cannot exclude a mild component congestive heart failure on today's exam. 2. Persistent mild left mid lung field subsegmental atelectasis. Persistent bilateral pleural effusions right side greater than left. Known pulmonary nodules are best identified on prior PET-CT. Electronically Signed   By: Marcello Moores  Register   On: 05/28/2016 09:58    Cardiac Studies   Echocardiogram: 05/23/2016 Study Conclusions  - Left ventricle: The cavity size was normal. Wall thickness was   normal. Systolic function was normal. The estimated ejection   fraction was in the range of 55% to 60%. Wall motion was normal;   there were no regional wall motion abnormalities. - Aortic valve: There was trivial regurgitation. - Mitral valve: Calcified annulus. Systolic bowing without   prolapse. There was moderate regurgitation directed centrally. - Left atrium: The atrium was mildly dilated. - Right atrium: The atrium was mildly dilated. - Atrial septum: No defect or patent foramen ovale was identified. - Pulmonary arteries: Systolic pressure was mildly to moderately   increased. PA peak pressure: 49 mm Hg (S).  Patient Profile     33F w/ PMH of HTN, longstanding history of metastatic breast cancer (involving skeleton and lungs with PET scan 04/2016 showing increased number of size of pulm/bone lesions), HLD, CKD stage III admitted with newly recognized atrial fib RVR and suspected acute diastolic CHF due to AF.  Assessment & Plan    1. Persistent atrial fib  - s/p successful TEE/DCCV at [facility] on 05/27/2016. Currently on Lopressor 25mg  BID.  - has been on PO  Amiodarone 400mg  BID since 05/24/16. Plan to decrease to 200mg  BID on 05/31/2016. - This patients CHA2DS2-VASc Score and unadjusted Ischemic Stroke Rate (% per year) is equal to 3.2 % stroke rate/year from a score of 3 (HTN, Female, Age). Currently on Eliquis 5mg  BID. Patient thinks she cannot undergo Chemotherapy/Radiation after being told she cannot interrupt her Eliquis for 30 days for a procedure. Reviewed with her that Dr. Sallyanne Kuster likely meant invasive procedures. Will review with Dr. Tamala Julian for further clarification. Would likely just require frequent CBC's to monitor Hgb and platelet count.   2. HTN - well-controlled  at 114/60 - 132/71 in the past 24 hours. - continue current medication regimen.   3. Coronary calcium seen on PET scan 04/2016  - asymptomatic. Continue to observe for any angina.  4. Acute Diastolic CHF - developed URI symptoms on 05/28/2016. CXR showing mild CHF with persistent mid left lung field atelectasis and persistent bilateral pleural effusions. BNP elevated to 536. Given IV Lasix with only recorded net output of -361mL but weight is down a questionable 12 pounds (?). Doubt the accuracy of either of these. Patient does report very frequent urination. Is asking we avoid giving additional IV diuresis.    5. CKD stage III  - baseline 1.1 - 1.3. Stable at 1.28 this AM.   6. Small pericardial effusion by TEE  - follow clinically as OP. No evidence of hemodynamic compromise.  7. Hypokalemia  - K+ 3.3 this AM. - given 20 mEq. Will give additional 40 mEq.   Signed, Erma Heritage, PA  05/29/2016, 10:42 AM  The patient has been seen in conjunction with Bernerd Pho, PA. All aspects of care have been considered and discussed. The patient has been personally interviewed, examined, and all clinical data has been reviewed.   Agree with decreased dose of amiodarone.  IV  Lasix 40 mg now. Still has JVD. Anticipate DC in AM. If still dyspneic, may need low dose  oral lasix.  Ready for DC from CV standpoint.  Eliquis is not a contraindication to Radiation or Chemotherapy unless severe thrombocytopenia.

## 2016-05-29 NOTE — Progress Notes (Signed)
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 difficile colitis, CKD-2, who presents with irregular heartbeat. Found to be A. fib with rapid ventricular response. Admitted started on Cardizem drip and a Eliquis. During the admission patient has been complaining of shortness of breath which was attributed to acute diastolic failure due to A. fib. Cardiology was consulted cardioversion was done patient tolerated, now in sinus normal rhythm. Patient continues to complain of shortness of breath, and cough however is improving.   Assessment & Plan:   Principal Problem:   Atrial fibrillation with RVR (HCC) Active Problems:   HLD (hyperlipidemia)   Essential hypertension   Bone metastases (HCC)   Hypokalemia   Acute kidney injury superimposed on CKD ([place])   Breast cancer metastasized to multiple sites ([place])   SOB (shortness of breath) on exertion   Acute diastolic heart failure (HCC)   Pericardial effusion - small by TEE 05/27/16   Pleural effusion   Paroxysmal atrial fibrillation ([place])  New onset Atrial fibrillation with RVR ([place]): s/p TEE/Cardioversion POD2 - Back to sinus, on metoprolol and eliquis. - CHA2DS2-VASc Scoreis 3, TNIs negative, TSH normal -Appreciate Cardiology's consultation and recommendations. -Amiodarone decreased to 200 daily, heart rate in the lower side, patient back to normal sinus rhythm -Metoprolol decrease it to 25 mg twice a day -Continue Eliquis 5 mg bid -Continue Telemetry   SOB 2/2 Acute diastolic failure due to A  Fib TEE/DCCV POD 2 -now back to normal rhythm and symptom persist,  -Repeat CXR in AM -We'll schedule nebs treatment around-the-clock every 6 hours -Mucomyst given patient reporting some mucus that cannot bring up -We'll give Lasix 40 mg IV -Flonase added -Flutter Valve  -Wean O2 as tolerated  -May need to consider antibiotic if symptoms persist. -Check for flu - patient refusing vaccine  HLD:LDL 54 -Continue home medications: Zocor  HTN: -On Metoprolol 25 mg BID  -Monitor BP   Hypokalemia: -Patients K+ was 3.3 -Repleted -Repeat BMP in AM  AoCKD-II: Baseline Cre 1.1 - slight increase likely due to aggressive diuresis  -Monitor renal function  -Cardiology to try another dose of Lasix  Breast cancer metastasized to multiple sites [facility]):  -pt is followed up and treated by oncologist, Dr. Marko Plume andradiation oncologist, Dr. Sondra Come. Currently on chemo and XRT. -f/u with Dr. Marko Plume and Dr. Sondra Come -Cancelled Radiation Treatment today because patient was hesitant. -Has Taxol Treatments at [facility]  DVT prophylaxis: Eliquis Code Status: FULL Family Communication: Discussed plan of care with patient's mother who was at bedside. Disposition Plan: Home in AM if Stable.   Consultants:   Cardiology  Medical Oncology  Procedures: TEE/DCCV  Antimicrobials: None  Subjective: Seen and examined at bedside today and stated she was doing better. Still had some chest congestion. No N/V or Abdominal Pain. No other concerns or complaints.   Objective: Vitals:   05/29/16 0833 05/29/16 0849 05/29/16 1020 05/29/16 1430  BP:    118/65  Pulse:   64 72  Resp:    18  Temp:    98.5 F (36.9 C)  TempSrc:    Oral  SpO2: 94% 94%  95%  Weight:      Height:        Intake/Output Summary (Last 24 hours) at 05/29/16 1552 Last data filed at 05/29/16 0900  Gross per 24 hour  Intake              720 ml  Output             1500 ml  Net             -780 ml   Filed Weights    05/22/16 1716 05/28/16 0512 05/29/16 [id number]  Weight: 73.3 kg (161 lb 8 oz) 75.2 kg (165 lb 11.2 oz) 69.5 kg (153 lb 4.8 oz)    Examination: Physical Exam:  Constitutional: WN/WD, NAD and appears calm and comfortable Eyes: Lidds and conjunctivae normal, sclerae anicteric  ENMT: External Ears, Nose appear normal. Grossly normal hearing.  Neck: Appears normal, supple, no cervical masses, normal ROM, no appreciable thyromegaly. Mild JVD Respiratory: Diminished with expiratory wheezing and mild crackles. No rales, rhonchi. Normal respiratory effort and patient is not tachypenic. No accessory muscle use.  Cardiovascular: RRR, no murmurs / rubs / gallops. S1 and S2 auscultated. No extremity edema.  Abdomen: Soft, non-tender, non-distended. No masses palpated. No appreciable hepatosplenomegaly. Bowel sounds positive.  GU: Deferred. Musculoskeletal: No clubbing / cyanosis of digits/nails. No joint deformity upper and lower extremities.  Skin: No rashes, lesions, ulcers. No induration; Warm and dry.  Neurologic: CN 2-12 grossly intact with no focal deficits. Sensation intact in all 4 Extremities. Romberg sign cerebellar reflexes not assessed.  Psychiatric: Normal judgment and insight. Alert and oriented x 3. Normal mood and appropriate affect.   Data Reviewed: I have personally reviewed following labs and imaging studies  CBC:  Recent Labs Lab 05/22/16 1734 05/23/16 0304 05/24/16 0452 05/28/16 0457 05/29/16 0453  WBC 2.2* 2.9* 3.3* 5.0 3.9*  NEUTROABS 1.0*  --  1.9  --  2.5  HGB 11.6* 11.4* 11.7* 11.7* 11.7*  HCT 33.8* 33.6* 35.1* 34.4* 34.8*  MCV 91.4 94.4 96.2 94.0 96.4  PLT 251 291 274 234 [id number]   Basic Metabolic Panel:  Recent Labs Lab 05/22/16 2138  05/24/16 0452 05/25/16 0407 05/26/16 0555 05/27/16 0437 05/28/16 0457 05/29/16 0453  NA  --   < > 140 140 140 138 138 140  K  --   < > 3.8 3.6 3.0* 3.9 4.3 3.3*  CL  --   < > 110 107 107 110 108 108  CO2  --   < > 23 25 24 22  22 26   GLUCOSE  --   < > 126* 116* 108* 113* 107* 102*  BUN  --   < > 23* 27* 39* 38* 38* 34*  CREATININE  --   < > 1.09* 1.13* 1.18* 1.18* 1.22* 1.28*  CALCIUM  --   < > 9.3 9.4 9.4 10.3 10.7* 10.5*  MG 2.0  --  2.0 2.0 2.0  --   --   --   < > = values in this interval not displayed. GFR: Estimated Creatinine Clearance: 36.3 mL/min (by C-G formula based on SCr of 1.28 mg/dL (H)). Liver Function Tests:  Recent Labs Lab 05/28/16 0457  AST 23  ALT 16  ALKPHOS 77  BILITOT 1.1  PROT 6.0*  ALBUMIN 3.3*   No results for input(s): LIPASE, AMYLASE in the last 168 hours. No results for input(s): AMMONIA in the last 168 hours. Coagulation Profile: No results for input(s): INR, PROTIME in the last 168 hours.  Cardiac Enzymes:  Recent Labs Lab 05/22/16 2138 05/23/16 0304 05/23/16 0952  TROPONINI <0.03 <0.03 <0.03   BNP (last 3 results) No results for input(s): PROBNP in the last 8760 hours. HbA1C: No results for input(s): HGBA1C in the last 72 hours. CBG: No results for input(s): GLUCAP in the last 168 hours. Lipid Profile: No results for input(s): CHOL, HDL, LDLCALC, TRIG, CHOLHDL, LDLDIRECT in the last 72 hours. Thyroid Function Tests: No results for input(s): TSH, T4TOTAL, FREET4, T3FREE, THYROIDAB in the last 72 hours. Anemia Panel: No results for input(s): VITAMINB12, FOLATE, FERRITIN, TIBC, IRON, RETICCTPCT in the last 72 hours. Sepsis Labs: No results for input(s): PROCALCITON, LATICACIDVEN in the last 168 hours.  No results found for this or any previous visit (from the past 240 hour(s)).   Radiology Studies: Dg Chest Port 1 View  Result Date: 05/28/2016 CLINICAL DATA:  Cough. EXAM: PORTABLE CHEST 1 VIEW COMPARISON:  05/24/2016.  PET-CT 12/22/2015 FINDINGS: Cardiomegaly with mild pulmonary venous congestion and mild interstitial prominence. Mild component congestive heart for on today's exam cannot be excluded . Mild left mid lung field subsegmental atelectasis again  noted. Pulmonary nodular densities previously identified are best identified on prior PET-CT. Persistent bilateral pleural effusions, right side greater than left. Surgical clips right chest. Prior thoracolumbar spine fusion . IMPRESSION: 1. Cannot exclude a mild component congestive heart failure on today's exam. 2. Persistent mild left mid lung field subsegmental atelectasis. Persistent bilateral pleural effusions right side greater than left. Known pulmonary nodules are best identified on prior PET-CT. Electronically Signed   By: Marcello Moores  Register   On: 05/28/2016 09:58   Scheduled Meds: . acetylcysteine  3 mL Nebulization Q6H  . amiodarone  200 mg Oral BID  . apixaban  5 mg Oral BID  . fluticasone  1 spray Each Nare BID  . furosemide  40 mg Intravenous Once  . levalbuterol  1.25 mg Nebulization Q6H  . loratadine  10 mg Oral Daily  . metoprolol tartrate  25 mg Oral BID  . simvastatin  20 mg Oral QHS  . vitamin B-12  1,000 mcg Oral BID  . vitamin C  500 mg Oral Daily  . vitamin E  200 Units Oral Daily   Continuous Infusions:   LOS: 7 days   Kerney Elbe, DO [facility] Pager [phone]  If 7PM-7AM, please contact night-coverage [email or web address] Password [email or web address] 05/29/2016, 3:52 PM

## 2016-05-29 NOTE — Progress Notes (Signed)
OT Cancellation Note  Patient Details Name: AYAKO LOFTY MRN: [id number] DOB: 08-Aug-1940   Cancelled Treatment:    Reason Eval/Treat Not Completed: Fatigue/lethargy limiting ability to participate Pt just finished with PT and wanted to eat her lunch Will check on pt next day  Kari Baars, [place]  Payton Mccallum D 05/29/2016, 12:02 PM

## 2016-05-29 NOTE — Telephone Encounter (Addendum)
Odelia Gage, RN on [place] will be able to come for radiation at 12 today before she is discharged.  Notified [place] 3.

## 2016-05-29 NOTE — Progress Notes (Signed)
Medical Oncology May 29, 2016, 8:44 AM  Hospital day 8 Antibiotics: none Chemotherapy:  weekly taxol given 05-16-16 Last zometa 04-01-16 Radiation to right hip area held since admission Eliquis begun 05-23-16 Cardioversion 05-27-16   EMR reviewed.   Subjective: Feels better since lasix yesterday and mucomyst, which improved cough and wheezing,  also slept well last pm. Minimal cough now, NP. No central chest discomfort. Not SOB with present activity. No bleeding. Walked to nurses station with walker / PT on 11-7. Eating well. Normal bowel movement today. Only discomfort is mild in right lower chest since previous coughing, area where remote VATS and tumor recurrence. Not noticing right hip pain now. Losing hair from recent chemo.   Objective: Vital signs in last 24 hours: Blood pressure 122/60, pulse (!) 57, temperature 98 F (36.7 C), temperature source Oral, resp. rate (!) 22, height 5\' 2"  (1.575 m), weight 153 lb 4.8 oz (69.5 kg), SpO2 99 %. Awake, alert, talkative and joking, fully oriented and appropriate. Looks more comfortable than at my rounds 11-7, only occasional slight cough and respirations not labored RA sitting side of bed. PERRL. Lungs minimal crackes posteriorly and left anterior, no wheezing or rales. Dull with decreased BS right base as baseline. No use of accessory muscles. NSL left upper arm. Heart RRR. Abdomen soft, not distended. LE no pitting edema, cords, tenderness. Moves all extremities easily.   Intake/Output from previous day: 11/07 0701 - 11/08 0700 In: 1200 [P.O.:1200] Out: 1500 [Urine:1500] Intake/Output this shift: No intake/output data recorded.    Lab Results:  Recent Labs  05/28/16 0457 05/29/16 0453  WBC 5.0 3.9*  HGB 11.7* 11.7*  HCT 34.4* 34.8*  PLT 234 237  ANC today 2.5 BMET  Recent Labs  05/28/16 0457 05/29/16 0453  NA 138 140  K 4.3 3.3*  CL 108 108  CO2 22 26  GLUCOSE 107* 102*  BUN 38* 34*  CREATININE 1.22* 1.28*   CALCIUM 10.7* 10.5*   I have ordered KCL 20 mEq po x 1 now  Studies/Results: Dg Chest Port 1 View  Result Date: 05/28/2016 CLINICAL DATA:  Cough. EXAM: PORTABLE CHEST 1 VIEW COMPARISON:  05/24/2016.  PET-CT 12/22/2015 FINDINGS: Cardiomegaly with mild pulmonary venous congestion and mild interstitial prominence. Mild component congestive heart for on today's exam cannot be excluded . Mild left mid lung field subsegmental atelectasis again noted. Pulmonary nodular densities previously identified are best identified on prior PET-CT. Persistent bilateral pleural effusions, right side greater than left. Surgical clips right chest. Prior thoracolumbar spine fusion . IMPRESSION: 1. Cannot exclude a mild component congestive heart failure on today's exam. 2. Persistent mild left mid lung field subsegmental atelectasis. Persistent bilateral pleural effusions right side greater than left. Known pulmonary nodules are best identified on prior PET-CT. Electronically Signed   By: Marcello Moores  Register   On: 05/28/2016 09:58   DISCUSSION I will see her back at [facility] as planned 11-16, with low dose weekly taxol that day if stable.  RT has been held in hospital, likely ok to resume either today if still in hospital, or tomorrow. I have sent message now to rad onc, asking if they could possibly get treatment in today before DC( if she leaves today) , otherwise resume outpatient later this week.   Assessment/Plan: 1. Post successful cardioversion of atrial fibrillation 05-27-16: remains on Eliquis, amiodarone, metoprolol.  A fib with rapid ventricular response: may have been intermittent in last 2 weeks PTA.  2.Increased respiratory congestion and cough following cardioversion:  improved with lasix and nebs (xopenex and mucomyst) . No apparent infection.  Note she has not had flu vaccine. Immunocompromised from all recent chemo, tho not neutropenic.  4. progressive metastatic breast cancer lungs andextensive bone  involvement by PET 05-02-16: treatment changed to weekly taxol beginning 05-09-16. Will resume taxol at [facility] after DC when stable. Counts ok including ANC today. Palliative radiation to right iliac area begun 10-31, planned ~ 10 treatments. Pain controlled, probably best not to transport to radiation onc with the respiratory symptoms now.  Prior to present taxol, no significant improvement on Aromasin x 4 months thru 01-02-16 and progression onxeloda 12-2015 thru 04-29-16. Last zometa 04-01-16, held with increased creatinine in Oct. .Pathologic compression fracture T10 and retropulsion of bone impinging on spinal cord for which she had T6 - L1 posterolateral arthrodesis with T10 laminectomy and T9/ T11 partial laminectomy by Dr Christella Noa on 2-10-1. Previous radiation to right hip and right femur helpful. 5. Previous hypercalcemia: follow, no IV bisphosphonate since 04-01-16 due to more elevated creatinine  6.needs flu vaccine, tho has refused to date 7.diarrhea Jan treated for C diff ( Ab + toxin -) and resolved. Still on contact isolation this admission from past history. 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  10.Advance Directives in place 11. Deconditioning with acute problems this week.PT saw yesterday. 12. Hypokalemia likely from diuresis: I have ordered oral K 20 mEq now x1  Please call or page me if I can help before I see her next.  Phone [phone]  Pager [facility]  Evlyn Clines MD

## 2016-05-29 NOTE — Progress Notes (Signed)
Physical Therapy Treatment Patient Details Name: Lisa [name] MRN: [id number] DOB: Jan 21, 1941 Today's Date: 05/29/2016    History of Present Illness This 75 y.o. female admitted from radiation/oncology office for irregular heartbeat and chest discomfort.  She was found to have A-Fib with RVR.  PMH includes metastatic breast CA with mets to pelvis and spine - she is currently taking chemo and receiving radiation; HTN, h/o C-Diff, CKD-2.      PT Comments    The patient tolerated ambulation. Noted  Audible expiratory rattles. Oxygen saturation 94% on RA.  Follow Up Recommendations  No PT follow up     Equipment Recommendations  None recommended by PT    Recommendations for Other Services       Precautions / Restrictions Precautions Precautions: Fall Restrictions Weight Bearing Restrictions: No    Mobility  Bed Mobility Overal bed mobility: Modified Independent                Transfers Overall transfer level: Modified independent Equipment used: Rolling walker (2 wheeled) Transfers: Sit to/from Stand Sit to Stand: Modified independent (Device/Increase time)         General transfer comment: patient reports thatn she gets to [facility] independently  Ambulation/Gait Ambulation/Gait assistance: Min guard Ambulation Distance (Feet): 220 Feet (and 20) Assistive device: Rolling walker (2 wheeled) Gait Pattern/deviations: Step-through pattern     General Gait Details: Patient stopped x 3 for standing rest break. Sats 94 and HR 71 while ambulating.   Stairs            Wheelchair Mobility    Modified Rankin (Stroke Patients Only)       Balance                                    Cognition Arousal/Alertness: Awake/alert Behavior During Therapy: Anxious                        Exercises      General Comments        Pertinent Vitals/Pain Pain Assessment: No/denies pain    Home Living                      Prior  Function            PT Goals (current goals can now be found in the care plan section) Progress towards PT goals: Progressing toward goals    Frequency    Min 3X/week      PT Plan Current plan remains appropriate    Co-evaluation             End of Session   Activity Tolerance: Patient tolerated treatment well Patient left: in bed;with call bell/phone within reach     Time: [id number] PT Time Calculation (min) (ACUTE ONLY): 19 min  Charges:  $Gait Training: 8-22 mins                    G Codes:      Claretha Cooper 05/29/2016, 12:34 PM Tresa Endo PT [phone]

## 2016-05-30 ENCOUNTER — Ambulatory Visit: Payer: Medicare Other

## 2016-05-30 ENCOUNTER — Ambulatory Visit
Admit: 2016-05-30 | Discharge: 2016-05-30 | Disposition: A | Discharge status: Home / Self Care | Payer: Medicare Other | Attending: Radiation Oncology | Admitting: Radiation Oncology

## 2016-05-30 ENCOUNTER — Telehealth: Payer: Self-pay | Admitting: Oncology

## 2016-05-30 ENCOUNTER — Inpatient Hospital Stay (HOSPITAL_COMMUNITY): Payer: Medicare Other

## 2016-05-30 LAB — CBC WITH DIFFERENTIAL/PLATELET
BASOS ABS: 0 10*3/uL (ref 0.0–0.1)
BASOS PCT: 0 %
EOS ABS: 0 10*3/uL (ref 0.0–0.7)
Eosinophils Relative: 1 %
HEMATOCRIT: 35.4 % — AB (ref 36.0–46.0)
HEMOGLOBIN: 11.9 g/dL — AB (ref 12.0–15.0)
Lymphocytes Relative: 15 %
Lymphs Abs: 0.8 10*3/uL (ref 0.7–4.0)
MCH: 32.4 pg (ref 26.0–34.0)
MCHC: 33.6 g/dL (ref 30.0–36.0)
MCV: 96.5 fL (ref 78.0–100.0)
Monocytes Absolute: 0.9 10*3/uL (ref 0.1–1.0)
Monocytes Relative: 18 %
NEUTROS ABS: 3.5 10*3/uL (ref 1.7–7.7)
NEUTROS PCT: 66 %
Platelets: 238 10*3/uL (ref 150–400)
RBC: 3.67 MIL/uL — AB (ref 3.87–5.11)
RDW: 15.6 % — ABNORMAL HIGH (ref 11.5–15.5)
WBC: 5.2 10*3/uL (ref 4.0–10.5)

## 2016-05-30 LAB — BASIC METABOLIC PANEL
ANION GAP: 8 (ref 5–15)
BUN: 29 mg/dL — ABNORMAL HIGH (ref 6–20)
CALCIUM: 11.1 mg/dL — AB (ref 8.9–10.3)
CO2: 24 mmol/L (ref 22–32)
CREATININE: 1.09 mg/dL — AB (ref 0.44–1.00)
Chloride: 107 mmol/L (ref 101–111)
GFR, EST AFRICAN AMERICAN: 57 mL/min — AB (ref >60)
GFR, EST NON AFRICAN AMERICAN: 49 mL/min — AB (ref >60)
Glucose, Bld: 104 mg/dL — ABNORMAL HIGH (ref 65–99)
Potassium: 4.2 mmol/L (ref 3.5–5.1)
SODIUM: 139 mmol/L (ref 135–145)

## 2016-05-30 LAB — MAGNESIUM: MAGNESIUM: 2.1 mg/dL (ref 1.7–2.4)

## 2016-05-30 LAB — PHOSPHORUS: Phosphorus: 2.8 mg/dL (ref 2.5–4.6)

## 2016-05-30 MED ORDER — FUROSEMIDE 10 MG/ML IJ SOLN
20.0000 mg | Freq: Once | INTRAMUSCULAR | Status: AC
  Administered 2016-05-30: 20 mg via INTRAVENOUS
  Filled 2016-05-30: qty 2

## 2016-05-30 MED ORDER — LEVALBUTEROL HCL 1.25 MG/0.5ML IN NEBU
1.2500 mg | INHALATION_SOLUTION | Freq: Three times a day (TID) | RESPIRATORY_TRACT | Status: DC
  Administered 2016-05-31: 1.25 mg via RESPIRATORY_TRACT
  Filled 2016-05-30 (×3): qty 0.5

## 2016-05-30 MED ORDER — ACETYLCYSTEINE 20 % IN SOLN
3.0000 mL | Freq: Three times a day (TID) | RESPIRATORY_TRACT | Status: DC
  Administered 2016-05-31: 3 mL via RESPIRATORY_TRACT
  Filled 2016-05-30 (×3): qty 4

## 2016-05-30 MED ORDER — LEVALBUTEROL HCL 0.63 MG/3ML IN NEBU: 0.6300 mg | INHALATION_SOLUTION | Freq: Four times a day (QID) | RESPIRATORY_TRACT | Status: DC | PRN

## 2016-05-30 NOTE — Telephone Encounter (Signed)
Called Lisa [name] and she would like radiation today.  Notified Jennifer, RT on linac 3.

## 2016-05-30 NOTE — Progress Notes (Signed)
Occupational Therapy Treatment Patient Details Name: Lisa [name] MRN: [id number] DOB: Apr 03, 1941 Today's Date: 05/30/2016    History of present illness This 75 y.o. female admitted from radiation/oncology office for irregular heartbeat and chest discomfort.  She was found to have A-Fib with RVR.  PMH includes metastatic breast CA with mets to pelvis and spine - she is currently taking chemo and receiving radiation; HTN, h/o C-Diff, CKD-2.     OT comments  Energy conservation hand out issued  Follow Up Recommendations  No OT follow up;Supervision - Intermittent    Equipment Recommendations  None recommended by OT       Precautions / Restrictions Precautions Precautions: Fall Restrictions Weight Bearing Restrictions: No       Mobility Bed Mobility               General bed mobility comments: pt OOB  Transfers Overall transfer level: Needs assistance     Sit to Stand: Supervision Stand pivot transfers: Supervision                ADL Overall ADL's : Needs assistance/impaired                 Upper Body Dressing : Set up;Sitting   Lower Body Dressing: Minimal assistance;Sit to/from stand;Cueing for safety;Cueing for sequencing   Toilet Transfer: Supervision/safety;Ambulation;Cueing for sequencing;Cueing for safety   Toileting- Clothing Manipulation and Hygiene: Supervision/safety;Sit to/from stand       Functional mobility during ADLs: Supervision/safety;Min guard General ADL Comments: OT issued and educated pt on energy conservation strategies.  Pt able to verbalize understanding                Cognition   Behavior During Therapy: [facility] for tasks assessed/performed Overall Cognitive Status: Within Functional Limits for tasks assessed                                    Pertinent Vitals/ Pain       Pain Assessment: No/denies pain         Frequency  Min 2X/week        Progress Toward Goals  OT Goals(current  goals can now be found in the care plan section)  Progress towards OT goals: Progressing toward goals  Acute Rehab OT Goals Patient Stated Goal: to get this over with and get my independence back  OT Goal Formulation: With patient Time For Goal Achievement: 06/07/16 Potential to Achieve Goals: Good  Plan Discharge plan remains appropriate       End of Session     Activity Tolerance Patient tolerated treatment well   Patient Left Other (comment) (on couch in room- student nurse aware)   Nurse Communication        [id number] OT Time Calculation (min): 9 min  Charges:    Payton Mccallum D 05/30/2016, 11:50 AM

## 2016-05-30 NOTE — Progress Notes (Signed)
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 difficile colitis, CKD-2, who presents with irregular heartbeat. Found to be A. fib with rapid ventricular response. Admitted started on Cardizem drip and a Eliquis. During the admission patient has been complaining of shortness of breath which was attributed to acute diastolic failure due to A. fib. Cardiology was consulted cardioversion was done patient tolerated, now in sinus normal rhythm. Patient continues to complain of shortness of breath, and cough however is improving. Patient being diuresed and steadily improving. Likely to be D/C'd Home in Am.   Assessment & Plan:   Principal Problem:   Atrial fibrillation with RVR (HCC) Active Problems:   HLD (hyperlipidemia)   Essential hypertension   Bone metastases (HCC)   Hypokalemia   Acute kidney injury superimposed on CKD (Lisa [name])   Breast cancer metastasized to multiple sites (Lisa [name])   SOB (shortness of breath) on exertion   Acute diastolic heart failure (HCC)   Pericardial effusion - small by TEE 05/27/16   Pleural effusion   Paroxysmal atrial fibrillation (HCC)  SOB 2/2 Acute Exacerbation of Diastolic CHF due to A Fib TEE/DCCV POD 3 -Shortness of Breath improving; -Repeat CXR showed Cardiomegaly with mild bilateral from interstitial prominence and bilateral effusions consistent congestive heart failure. No change from prior exam. Persistent left mid lung field subsegmental atelectasis. Low lung volumes. No pneumothorax. Surgical clips rightaxilla.  Prior thoracolumbar spine fusion . -BNP was 536.6 -ECHO showed The cavity size was normal. Wall thickness was normal. Systolic function was normal. The estimated ejection fraction was in the range of 55% to 60%. Wall motion was normal; there were no regional wall motion abnormalities. -We'll schedule nebs treatment around-the-clock every 6 hours; Xopenex Nebulizers -Mucomyst given patient reporting some mucus that cannot bring up -IV Lasix given by Cardiology Yesterday; Given IV 20 mg today will repeat IV Lasix tonight -Flonase added -C/w Flutter Valve  -Wean O2 as tolerated  -Check for flu - patient refusing vaccine  New onset Atrial fibrillation with RVR ([place]): s/p TEE/Cardioversion POD3- Back to sinus, on metoprolol and eliquis. - CHA2DS2-VASc Scoreis 3, TNIs negative, TSH normal -Appreciate Cardiology's consultation and recommendations. -Amiodarone decreased to 200 daily, heart rate in the lower side, patient back to normal sinus rhythm -Metoprolol decrease it to 25 mg twice a day -Continue Eliquis 5 mg bid -Continue Telemetry   HLD: -LDL 54 -Continue home medications: Simvastatin 20 mg  HTN: -On Metoprolol 25 mg BID  -Monitor BP   Hypokalemia: -Improved. Patient's K+ was 4.2 -Repeat BMP in AM  AoCKD-II:  -Baseline Cre 1.1  -Monitor renal function  -Will repeat IV Lasix 20 mg today and tonight.   Breast cancer metastasized to multiple sites Lisa [name]):  -pt is followed up and treated by oncologist, Lisa [name] andradiation oncologist, Lisa [name]. Currently on chemo and XRT. -f/u with Lisa [name] and Lisa [name] -Cancelled Radiation Treatment today because patient was hesitant. -Has Taxol Treatments at [facility]  DVT prophylaxis: Eliquis Code Status: FULL Family Communication: No family at bedside Disposition Plan: Home in AM if Stable.   Consultants:   Cardiology  Medical Oncology  Procedures: TEE/DCCV  Antimicrobials: None  Subjective: Seen and  examined at bedside today and stated she had a bad night because she didn't sleep well but stated she felt better. No CP/N/V and SOB was improving but stated she still had some expiratory wheezing.  Objective: Vitals:   05/29/16 1539 05/29/16 2137 05/30/16 0626 05/30/16 0846  BP:  117/71 (!) 143/62 124/60  Pulse:  69 61 64  Resp:  18 20   Temp:  98.3 F (36.8 C) 98.2 F (36.8 C) 97.9 F (36.6 C)  TempSrc:  Oral Oral Oral  SpO2: 98% 97% 99% 96%  Weight:   73 kg (160 lb 14.4 oz)   Height:        Intake/Output Summary (Last 24 hours) at 05/30/16 1211 Last data filed at 05/30/16 1000  Gross per 24 hour  Intake              480 ml  Output             2050 ml  Net            -1570 ml   Filed Weights   05/28/16 0512 05/29/16 0633 05/30/16 0626  Weight: 75.2 kg (165 lb 11.2 oz) 69.5 kg (153 lb 4.8 oz) 73 kg (160 lb 14.4 oz)    Examination: Physical Exam:  Constitutional: WN/WD, NAD and appears calm and comfortable sitting bedside Eyes: Lidds and conjunctivae normal, sclerae anicteric  ENMT: External Ears, Nose appear normal. Grossly normal hearing.  Neck: Appears normal, supple, no cervical masses, normal ROM, no appreciable thyromegaly. No appreciable JVD today.  Respiratory: Diminished with mild wheezing and mild crackles. No rales, rhonchi. Normal respiratory effort and patient is not tachypenic. No accessory muscle use.  Cardiovascular: RRR, no murmurs / rubs / gallops. S1 and S2 auscultated. No extremity edema.  Abdomen: Soft, non-tender, non-distended. No masses palpated. No appreciable hepatosplenomegaly. Bowel sounds positive.  GU: Deferred. Musculoskeletal: No clubbing / cyanosis of digits/nails. No joint deformity upper and lower extremities.  Skin: No rashes, lesions, ulcers. No induration; Warm and dry.  Neurologic: CN 2-12 grossly intact with no focal deficits. Sensation intact in all 4 Extremities. Romberg sign cerebellar reflexes not assessed.  Psychiatric: Normal  judgment and insight. Alert and oriented x 3. Normal mood and appropriate affect.   Data Reviewed: I have personally reviewed following labs and imaging studies  CBC:  Recent Labs Lab 05/24/16 0452 05/28/16 0457 05/29/16 0453 05/30/16 0452  WBC 3.3* 5.0 3.9* 5.2  NEUTROABS 1.9  --  2.5 3.5  HGB 11.7* 11.7* 11.7* 11.9*  HCT 35.1* 34.4* 34.8* 35.4*  MCV 96.2 94.0 96.4 96.5  PLT 274 234 237 [id number]   Basic Metabolic Panel:  Recent Labs Lab 05/24/16 0452 05/25/16 0407 05/26/16 0555 05/27/16 0437 05/28/16 0457 05/29/16 0453 05/30/16 0452  NA 140 140 140 138 138 140 139  K 3.8 3.6 3.0* 3.9 4.3 3.3* 4.2  CL 110 107 107 110 108 108 107  CO2 23 25 24 22 22 26 24   GLUCOSE 126* 116* 108* 113* 107* 102* 104*  BUN 23* 27* 39* 38* 38* 34* 29*  CREATININE 1.09* 1.13* 1.18* 1.18* 1.22* 1.28* 1.09*  CALCIUM 9.3 9.4 9.4 10.3 10.7* 10.5* 11.1*  MG 2.0 2.0 2.0  --   --   --  2.1  PHOS  --   --   --   --   --   --  2.8   GFR: Estimated Creatinine Clearance: 43.7 mL/min (by C-G formula based  on SCr of 1.09 mg/dL (H)). Liver Function Tests:  Recent Labs Lab 05/28/16 0457  AST 23  ALT 16  ALKPHOS 77  BILITOT 1.1  PROT 6.0*  ALBUMIN 3.3*   No results for input(s): LIPASE, AMYLASE in the last 168 hours. No results for input(s): AMMONIA in the last 168 hours. Coagulation Profile: No results for input(s): INR, PROTIME in the last 168 hours. Cardiac Enzymes: No results for input(s): CKTOTAL, CKMB, CKMBINDEX, TROPONINI in the last 168 hours. BNP (last 3 results) No results for input(s): PROBNP in the last 8760 hours. HbA1C: No results for input(s): HGBA1C in the last 72 hours. CBG: No results for input(s): GLUCAP in the last 168 hours. Lipid Profile: No results for input(s): CHOL, HDL, LDLCALC, TRIG, CHOLHDL, LDLDIRECT in the last 72 hours. Thyroid Function Tests: No results for input(s): TSH, T4TOTAL, FREET4, T3FREE, THYROIDAB in the last 72 hours. Anemia Panel: No results for  input(s): VITAMINB12, FOLATE, FERRITIN, TIBC, IRON, RETICCTPCT in the last 72 hours. Sepsis Labs: No results for input(s): PROCALCITON, LATICACIDVEN in the last 168 hours.  No results found for this or any previous visit (from the past 240 hour(s)).   Radiology Studies: Dg Chest Port 1 View  Result Date: 05/30/2016 CLINICAL DATA:  Shortness of breath. EXAM: PORTABLE CHEST 1 VIEW COMPARISON:  05/28/2016 . FINDINGS: Cardiomegaly with mild bilateral from interstitial prominence and bilateral effusions consistent congestive heart failure. No change from prior exam. Persistent left mid lung field subsegmental atelectasis. Low lung volumes. No pneumothorax. Surgical clips right axilla. Prior thoracolumbar spine fusion . IMPRESSION: 1. Congestive heart failure with pulmonary interstitial edema and bilateral pleural effusions. No change from prior exam. 2.  Left mid lung field subsegmental atelectasis. Electronically Signed   By: Marcello Moores  Register   On: 05/30/2016 07:59   Scheduled Meds: . acetylcysteine  3 mL Nebulization Q6H  . amiodarone  200 mg Oral BID  . apixaban  5 mg Oral BID  . fluticasone  1 spray Each Nare BID  . levalbuterol  1.25 mg Nebulization Q6H  . loratadine  10 mg Oral Daily  . metoprolol tartrate  25 mg Oral BID  . simvastatin  20 mg Oral QHS  . vitamin B-12  1,000 mcg Oral BID  . vitamin C  500 mg Oral Daily  . vitamin E  200 Units Oral Daily   Continuous Infusions:   Lisa: 8 days   Kerney Elbe, DO [facility] Pager [phone]  If 7PM-7AM, please contact night-coverage [email or web address] Password [email or web address] 05/30/2016, 12:11 PM

## 2016-05-30 NOTE — Progress Notes (Signed)
Patient Name: Lisa [name] Date of Encounter: 05/30/2016  Primary Cardiologist: Dr. [name] Problem List     Principal Problem:   Atrial fibrillation with RVR [facility]) Active Problems:   HLD (hyperlipidemia)   Essential hypertension   Bone metastases (HCC)   Hypokalemia   Acute kidney injury superimposed on CKD ([place])   Breast cancer metastasized to multiple sites ([place])   SOB (shortness of breath) on exertion   Acute diastolic heart failure (HCC)   Pericardial effusion - small by TEE 05/27/16   Pleural effusion   Paroxysmal atrial fibrillation (HCC)    Subjective   Breathing significantly improved. Cough has essentially resolved.   Inpatient Medications    Scheduled Meds: . acetylcysteine  3 mL Nebulization Q6H  . amiodarone  200 mg Oral BID  . apixaban  5 mg Oral BID  . fluticasone  1 spray Each Nare BID  . levalbuterol  1.25 mg Nebulization Q6H  . loratadine  10 mg Oral Daily  . metoprolol tartrate  25 mg Oral BID  . simvastatin  20 mg Oral QHS  . vitamin B-12  1,000 mcg Oral BID  . vitamin C  500 mg Oral Daily  . vitamin E  200 Units Oral Daily   Continuous Infusions:  PRN Meds: ALPRAZolam, ondansetron, traMADol, zolpidem   Vital Signs    Vitals:   05/29/16 2137 05/30/16 0626 05/30/16 0846 05/30/16 1251  BP: 117/71 (!) 143/62 124/60 (!) 117/50  Pulse: 69 61 64 61  Resp: 18 20  20   Temp: 98.3 F (36.8 C) 98.2 F (36.8 C) 97.9 F (36.6 C) 98.7 F (37.1 C)  TempSrc: Oral Oral Oral Oral  SpO2: 97% 99% 96% 96%  Weight:  160 lb 14.4 oz (73 kg)    Height:        Intake/Output Summary (Last 24 hours) at 05/30/16 1304 Last data filed at 05/30/16 1000  Gross per 24 hour  Intake              480 ml  Output             2050 ml  Net            -1570 ml   Filed Weights   05/28/16 0512 05/29/16 0633 05/30/16 0626  Weight: 165 lb 11.2 oz (75.2 kg) 153 lb 4.8 oz (69.5 kg) 160 lb 14.4 oz (73 kg)    Physical Exam    GEN: Well nourished,  well developed, Caucasian female appearing in no acute distress.  HEENT: Grossly normal.  Neck: Supple, no JVD, carotid bruits, or masses. Cardiac: RRR, no murmurs, rubs, or gallops. No clubbing, cyanosis, edema.  Radials/DP/PT 2+ and equal bilaterally.  Respiratory:  Respirations regular and unlabored, diffuse rhonchi throughout lung fields. GI: Soft, nontender, nondistended, BS + x 4. MS: no deformity or atrophy. Skin: warm and dry, no rash. Neuro:  Strength and sensation are intact. Psych: AAOx3.  Normal affect.  Labs    CBC  Recent Labs  05/29/16 0453 05/30/16 0452  WBC 3.9* 5.2  NEUTROABS 2.5 3.5  HGB 11.7* 11.9*  HCT 34.8* 35.4*  MCV 96.4 96.5  PLT 237 [id number]   Basic Metabolic Panel  Recent Labs  05/29/16 0453 05/30/16 0452  NA 140 139  K 3.3* 4.2  CL 108 107  CO2 26 24  GLUCOSE 102* 104*  BUN 34* 29*  CREATININE 1.28* 1.09*  CALCIUM 10.5* 11.1*  MG  --  2.1  PHOS  --  2.8   Liver Function Tests  Recent Labs  05/28/16 0457  AST 23  ALT 16  ALKPHOS 77  BILITOT 1.1  PROT 6.0*  ALBUMIN 3.3*   No results for input(s): LIPASE, AMYLASE in the last 72 hours. Cardiac Enzymes No results for input(s): CKTOTAL, CKMB, CKMBINDEX, TROPONINI in the last 72 hours. BNP Invalid input(s): POCBNP D-Dimer No results for input(s): DDIMER in the last 72 hours. Hemoglobin A1C No results for input(s): HGBA1C in the last 72 hours. Fasting Lipid Panel No results for input(s): CHOL, HDL, LDLCALC, TRIG, CHOLHDL, LDLDIRECT in the last 72 hours. Thyroid Function Tests No results for input(s): TSH, T4TOTAL, T3FREE, THYROIDAB in the last 72 hours.  Invalid input(s): FREET3  Telemetry    NSR, HR in mid-50's - 60's.  - Personally Reviewed  ECG    No new tracings.   Radiology    Dg Chest Port 1 View  Result Date: 05/28/2016 CLINICAL DATA:  Cough. EXAM: PORTABLE CHEST 1 VIEW COMPARISON:  05/24/2016.  PET-CT 12/22/2015 FINDINGS: Cardiomegaly with mild pulmonary venous  congestion and mild interstitial prominence. Mild component congestive heart for on today's exam cannot be excluded . Mild left mid lung field subsegmental atelectasis again noted. Pulmonary nodular densities previously identified are best identified on prior PET-CT. Persistent bilateral pleural effusions, right side greater than left. Surgical clips right chest. Prior thoracolumbar spine fusion . IMPRESSION: 1. Cannot exclude a mild component congestive heart failure on today's exam. 2. Persistent mild left mid lung field subsegmental atelectasis. Persistent bilateral pleural effusions right side greater than left. Known pulmonary nodules are best identified on prior PET-CT. Electronically Signed   By: Marcello Moores  Register   On: 05/28/2016 09:58    Cardiac Studies   Echocardiogram: 05/23/2016 Study Conclusions  - Left ventricle: The cavity size was normal. Wall thickness was   normal. Systolic function was normal. The estimated ejection   fraction was in the range of 55% to 60%. Wall motion was normal;   there were no regional wall motion abnormalities. - Aortic valve: There was trivial regurgitation. - Mitral valve: Calcified annulus. Systolic bowing without   prolapse. There was moderate regurgitation directed centrally. - Left atrium: The atrium was mildly dilated. - Right atrium: The atrium was mildly dilated. - Atrial septum: No defect or patent foramen ovale was identified. - Pulmonary arteries: Systolic pressure was mildly to moderately   increased. PA peak pressure: 49 mm Hg (S).  Patient Profile     55F w/ PMH of HTN, longstanding history of metastatic breast cancer (involving skeleton and lungs with PET scan 04/2016 showing increased number of size of pulm/bone lesions), HLD, CKD stage III admitted with newly recognized atrial fib RVR and suspected acute diastolic CHF due to AF.  Assessment & Plan    1. Persistent atrial fib  Maintaining normal sinus rhythm since cardioversion. The  patient is on amiodarone.   2. HTN -Well-controlled  3. Coronary calcium seen on PET scan 04/2016  - asymptomatic. Continue to observe for any angina.  4. Acute Diastolic CHF - Nice diuresis overnight . If dyspnea/cough recur, consider chronic diuretic therapy.  5. CKD stage III  - Kidney function has improved despite significant diuresis.   6. Hypokalemia  - Resolved   Please call if we may be of further assistance. Follow-up with cardiology, Dr.Croitoru as recommended.   Signed, Sinclair Grooms, MD  05/30/2016, 1:04 PM

## 2016-05-30 NOTE — Progress Notes (Signed)
Patient had a 7 beat run of Vtach. Asymptomatic. On call notified.

## 2016-05-30 NOTE — Progress Notes (Signed)
Patient unknown to me. Received call from RT staff inquiring about precaution status. Upon further investigation noted patient is under VRE precautions. Advised RT staff to gown, glove, and wash hands. Katie, RT verbalized understanding.

## 2016-05-30 NOTE — Progress Notes (Signed)
Physical Therapy Treatment Patient Details Name: NUSAIBAH HEIDEL MRN: [id number] DOB: 12/29/1940 Today's Date: 05/30/2016    History of Present Illness This 75 y.o. female admitted from radiation/oncology office for irregular heartbeat and chest discomfort.  She was found to have A-Fib with RVR.  PMH includes metastatic breast CA with mets to pelvis and spine - she is currently taking chemo and receiving radiation; HTN, h/o C-Diff, CKD-2.      PT Comments    Patient was seen in recliner upon arrival. Performed sit to stand x Supervision. Impulsive and did not require any VC's for safe UE placement. Gait x 250 ft x min guard. O2 sats 96% and HR 73 bpm during ambulation. Required one standing rest break to take vitals. Returned back to recliner where pt was transported to radiation therapy.   Follow Up Recommendations  No PT follow up     Equipment Recommendations  None recommended by PT    Recommendations for Other Services       Precautions / Restrictions Precautions Precautions: Fall Restrictions Weight Bearing Restrictions: No    Mobility  Bed Mobility               General bed mobility comments: pt OOB  Transfers Overall transfer level: Needs assistance Equipment used: Rolling walker (2 wheeled) Transfers: Sit to/from Stand Sit to Stand: Supervision Stand pivot transfers: Supervision       General transfer comment: impulsive.   Ambulation/Gait Ambulation/Gait assistance: Min guard Ambulation Distance (Feet): 250 Feet Assistive device: Rolling walker (2 wheeled) Gait Pattern/deviations: Step-through pattern;Trunk flexed Gait velocity: decreased Gait velocity interpretation: Below normal speed for age/gender General Gait Details: required 1 standing rest break. O2 sats 96% and HR 73 during ambulation.    Stairs            Wheelchair Mobility    Modified Rankin (Stroke Patients Only)       Balance                                     Cognition Arousal/Alertness: Awake/alert Behavior During Therapy: WFL for tasks assessed/performed Overall Cognitive Status: Within Functional Limits for tasks assessed                      Exercises      General Comments        Pertinent Vitals/Pain Pain Assessment: No/denies pain    Home Living                      Prior Function            PT Goals (current goals can now be found in the care plan section) Acute Rehab PT Goals Patient Stated Goal: to get this over with and get my independence back  Progress towards PT goals: Progressing toward goals    Frequency    Min 3X/week      PT Plan Current plan remains appropriate    Co-evaluation             End of Session Equipment Utilized During Treatment: Gait belt Activity Tolerance: Patient tolerated treatment well Patient left: with call bell/phone within reach;in chair     Time:  - 14:10 - 14:20    Charges:     1 gt  G CodesHall Busing, SPTA [facility] Acute Rehab [phone]  Present and agree with above  Rica Koyanagi  PTA [facility]  Acute  Rehab Pager      [phone]

## 2016-05-31 ENCOUNTER — Ambulatory Visit
Admit: 2016-05-31 | Discharge: 2016-05-31 | Disposition: A | Discharge status: Home / Self Care | Payer: Medicare Other | Attending: Radiation Oncology | Admitting: Radiation Oncology

## 2016-05-31 ENCOUNTER — Ambulatory Visit: Payer: Medicare Other

## 2016-05-31 DIAGNOSIS — C50919 Malignant neoplasm of unspecified site of unspecified female breast: Secondary | ICD-10-CM | POA: Diagnosis not present

## 2016-05-31 LAB — CBC WITH DIFFERENTIAL/PLATELET
BASOS ABS: 0.1 10*3/uL (ref 0.0–0.1)
BASOS PCT: 1 %
EOS ABS: 0.1 10*3/uL (ref 0.0–0.7)
EOS PCT: 2 %
HCT: 35.4 % — ABNORMAL LOW (ref 36.0–46.0)
Hemoglobin: 11.7 g/dL — ABNORMAL LOW (ref 12.0–15.0)
Lymphocytes Relative: 13 %
Lymphs Abs: 0.6 10*3/uL — ABNORMAL LOW (ref 0.7–4.0)
MCH: 31.4 pg (ref 26.0–34.0)
MCHC: 33.1 g/dL (ref 30.0–36.0)
MCV: 94.9 fL (ref 78.0–100.0)
Monocytes Absolute: 0.9 10*3/uL (ref 0.1–1.0)
Monocytes Relative: 19 %
Neutro Abs: 3.1 10*3/uL (ref 1.7–7.7)
Neutrophils Relative %: 65 %
PLATELETS: 207 10*3/uL (ref 150–400)
RBC: 3.73 MIL/uL — AB (ref 3.87–5.11)
RDW: 15.3 % (ref 11.5–15.5)
WBC: 4.7 10*3/uL (ref 4.0–10.5)

## 2016-05-31 LAB — PHOSPHORUS: PHOSPHORUS: 3.3 mg/dL (ref 2.5–4.6)

## 2016-05-31 LAB — COMPREHENSIVE METABOLIC PANEL WITH GFR
ALT: 15 U/L (ref 14–54)
AST: 26 U/L (ref 15–41)
Albumin: 3 g/dL — ABNORMAL LOW (ref 3.5–5.0)
Alkaline Phosphatase: 72 U/L (ref 38–126)
Anion gap: 5 (ref 5–15)
BUN: 24 mg/dL — ABNORMAL HIGH (ref 6–20)
CO2: 25 mmol/L (ref 22–32)
Calcium: 11 mg/dL — ABNORMAL HIGH (ref 8.9–10.3)
Chloride: 110 mmol/L (ref 101–111)
Creatinine, Ser: 1.25 mg/dL — ABNORMAL HIGH (ref 0.44–1.00)
GFR calc Af Amer: 49 mL/min — ABNORMAL LOW (ref >60)
GFR calc non Af Amer: 42 mL/min — ABNORMAL LOW (ref >60)
Glucose, Bld: 100 mg/dL — ABNORMAL HIGH (ref 65–99)
Potassium: 3.8 mmol/L (ref 3.5–5.1)
Sodium: 140 mmol/L (ref 135–145)
Total Bilirubin: 0.7 mg/dL (ref 0.3–1.2)
Total Protein: 5.9 g/dL — ABNORMAL LOW (ref 6.5–8.1)

## 2016-05-31 LAB — MAGNESIUM: Magnesium: 2.1 mg/dL (ref 1.7–2.4)

## 2016-05-31 MED ORDER — AMIODARONE HCL 200 MG PO TABS: 200.0000 mg | ORAL_TABLET | Freq: Two times a day (BID) | ORAL | 0 refills | Status: DC

## 2016-05-31 MED ORDER — METOPROLOL TARTRATE 25 MG PO TABS: 25.0000 mg | ORAL_TABLET | Freq: Two times a day (BID) | ORAL | 0 refills | Status: DC

## 2016-05-31 MED ORDER — FLUTICASONE PROPIONATE 50 MCG/ACT NA SUSP: 1.0000 | Freq: Two times a day (BID) | NASAL | 2 refills | Status: DC

## 2016-05-31 MED ORDER — APIXABAN 5 MG PO TABS: 5.0000 mg | ORAL_TABLET | Freq: Two times a day (BID) | ORAL | 0 refills | Status: DC

## 2016-05-31 MED FILL — METOPROLOL TARTRATE 25 MG T: 25 | 30 days supply | Qty: 60 | Fill #0

## 2016-05-31 MED FILL — ELIQUIS 5 MG TABLET: 5 | 30 days supply | Qty: 60 | Fill #0

## 2016-05-31 MED FILL — AMIODARONE HCL 200 MG TAB: 200 | 30 days supply | Qty: 60 | Fill #0

## 2016-05-31 MED FILL — FLUTICASONE PROP 50 MCG SPR: 50 | 30 days supply | Qty: 16 | Fill #0

## 2016-05-31 NOTE — Care Management Note (Signed)
Case Management Note  Patient Details  Name: Lisa [name] MRN: [id number] Date of Birth: 06-01-1941  Subjective/Objective: Per MD provided patient w/eliquis free 30day coupon-patient voiced understanding.No further CM needs.                   Action/Plan:d/c home.   Expected Discharge Date:   (unknown)               Expected Discharge Plan:  Home/Self Care  In-House Referral:     Discharge planning Services  CM Consult  Post Acute Care Choice:    Choice offered to:     DME Arranged:    DME Agency:     HH Arranged:    [place] Agency:     Status of Service:  Completed, signed off  If discussed at [employer] of Stay Meetings, dates discussed:    Additional Comments:  Dessa Phi, RN 05/31/2016, 11:29 AM

## 2016-05-31 NOTE — Progress Notes (Signed)
Completed D/C teaching. Answered questions. Patient will go to radiation and then be D/C home with family in stable condition.

## 2016-05-31 NOTE — Discharge Summary (Signed)
Physician Discharge Summary  Lisa [name] [id number] DOB: [birth year] DOA: 05/22/2016  PCP: [name], MD  Admit date: 05/22/2016 Discharge date: 05/31/2016  Admitted From: Home Disposition: Home  Recommendations for Outpatient Follow-up:  1. Follow up with PCP in 1-2 weeks 2. Follow up with Dr. Sallyanne Kuster in Cardiology 3. Follow up with Atrial Fibrillation Clinic on Monday 4. Follow up with Hematology/Oncology with Dr. Marko Plume 5. Follow up with Radiation Oncology Dr. Sondra Come 6. 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 difficile colitis, CKD-2, who presents with irregular heartbeat. Found to be A. fib with rapid ventricular response. Admitted started on Cardizem drip and a Eliquis. During the admission patient has been complaining of shortness of breath which was attributed to acute diastolic failure due to A. fib. Cardiology was consulted cardioversion was done patient tolerated, now in sinus normal rhythm. Patient continued to complain of shortness of breath and cough but was being diuresed and steadily improving. This AM she is doing well and ready to go home. She will follow up with her PCP, Cardiology, Hematology/Oncology and Radiation Oncology as an outpatient.   Discharge Diagnoses:  Principal Problem:   Atrial fibrillation with RVR ([place]) Active Problems:   HLD (hyperlipidemia)   Essential hypertension   Bone metastases (HCC)   Hypokalemia   Acute kidney injury  superimposed on CKD ([place])   Breast cancer metastasized to multiple sites [facility])   Shortness of breath   Acute diastolic heart failure (HCC)   Pericardial effusion - small by TEE 05/27/16   Pleural effusion   Paroxysmal atrial fibrillation (HCC)  SOB 2/2 Acute Exacerbation of Diastolic CHF due to A Fib TEE/DCCV POD 4 -Shortness of Breath much improved; -BNP was 536.6 -ECHO showed The cavity size was normal. Wall thickness was normal. Systolic function was normal. The estimated ejection fraction was in the range of 55% to 60%. Wall motion was normal;there were no regional wall motion abnormalities. -Had Breathing Tx while in hospital -Diuresed with IV Lasix; Will Defer to Cardiology as an outpatient to start po Diuresis for chronic diuretic therapy -Flonase added -C/w Flutter Valve  -Weaned O2 as tolerated; Does not require Home O2  New onset Atrial fibrillation with RVR ([place]): s/p TEE/Cardioversion POD4- Back to sinus, on metoprolol and eliquis. - CHA2DS2-VASc Scoreis 3, TNIs negative, TSH normal -Appreciated Cardiology's consultation and recommendations. -C/w Amiodarone 200 mg po Daily -Metoprolol was decreased to 25 mg daily -Continue Eliquis 5 mg bid -Follow up at [facility] and with Cardiology as an outpatient.   HLD: -LDL 54 -Continue home medications: Simvastatin 20 mg  HTN: -Continue Metoprolol 25 mg po BID  Hypokalemia: -Improved and Resolved. Patient's K+ was 3.9 -Repeat BMP as an outpatient  AoCKD-II:  -Baseline Cre 1.1  -Creatinine today was 1.25 -Have BMP done as an Outpatient at PCP  Breast cancer metastasized to multiple sites [facility]):  -Pt is followed up and treated by oncologist, Dr. Marko Plume andradiation oncologist, Dr. Sondra Come. Currently on chemo and XRT. -F/u with Dr. Marko Plume and Dr. Sondra Come -Has Taxol Treatments at [facility] -Patient to have Radiation Treatment today and  be D/C'd Home  Discharge Instructions  Discharge  Instructions    Call MD for:  difficulty breathing, headache or visual disturbances    Complete by:  As directed    Call MD for:  persistant dizziness or light-headedness    Complete by:  As directed    Call MD for:  persistant nausea and vomiting    Complete by:  As directed    Diet - low sodium heart healthy    Complete by:  As directed    Discharge instructions    Complete by:  As directed    Follow up with PCP, Cardiology, and Hematology/Oncology and Radiation Oncology as a outpatient. Take all medications as prescribed. If symptoms change or worsen please return to the ER for Evaluation.   Increase activity slowly    Complete by:  As directed        Medication List    TAKE these medications   amiodarone 200 MG tablet Commonly known as:  PACERONE Take 1 tablet (200 mg total) by mouth 2 (two) times daily.   apixaban 5 MG Tabs tablet Commonly known as:  ELIQUIS Take 1 tablet (5 mg total) by mouth 2 (two) times daily.   dexamethasone 4 MG tablet Commonly known as:  DECADRON Take 5 tabs (20 mg) 12 hours and 6 hours before taxol for first chemo. Take 5 tabs (20mg ) 12 hours before taxol for subsequent treatment.   fluticasone 50 MCG/ACT nasal spray Commonly known as:  FLONASE Place 1 spray into both nostrils 2 (two) times daily.   loratadine 10 MG tablet Commonly known as:  CLARITIN Take 10 mg by mouth daily.   metoprolol tartrate 25 MG tablet Commonly known as:  LOPRESSOR Take 1 tablet (25 mg total) by mouth 2 (two) times daily.   ondansetron 8 MG tablet Commonly known as:  ZOFRAN Take 1 tablet (8 mg total) by mouth every 8 (eight) hours as needed for nausea or vomiting.   Potassium 99 MG Tabs Take 1 tablet by mouth daily.   simvastatin 20 MG tablet Commonly known as:  ZOCOR Take 20 mg by mouth at bedtime.   traMADol 50 MG tablet Commonly known as:  ULTRAM Take 50-100 mg by mouth every 8 (eight) hours as needed for moderate pain.   TYLENOL 325 MG  tablet Generic drug:  acetaminophen Take 325 mg by mouth every 6 (six) hours as needed for mild pain. Reported on 01/10/2016   vitamin B-12 1000 MCG tablet Commonly known as:  CYANOCOBALAMIN Take 1,000 mcg by mouth 2 (two) times daily.   vitamin C 500 MG tablet Commonly known as:  ASCORBIC ACID Take 500 mg by mouth daily.   vitamin E 200 UNIT capsule Take 200 Units by mouth daily.      Follow-up Information    [facility] Follow up.   Specialty:  Cardiology Why:  A follow-up appointment has been made for you in the Atrial Fib Clinic at [facility] on 06/03/16 at [place] may call the office for instructions and garage code if needed. Contact information: [address] [id number] [place] [address] [phone]         Allergies  Allergen Reactions  . Diphenhydramine Hcl Other (See Comments)    Severe headaches  . Codeine Sulfate Rash    REACTION: rash. Makes her jittery  . Oseltamivir Phosphate Nausea And Vomiting    REACTION: rash  . Oxycodone-Acetaminophen Nausea And Vomiting    REACTION:  emesis  . Prednisone Other (See Comments)    REACTION: hiatal hernia, runny  Nose, felt terrible  . Sudafed [Pseudoephedrine Hcl] Other (See Comments)    vertigo    Consultations:  Cardiology  Hematology/Oncology  Radiation Oncology  Procedures/Studies: Dg Chest 2 View  Result Date: 05/22/2016 CLINICAL DATA:  Recheck heart rate, irregular pulse EXAM: CHEST  2 VIEW COMPARISON:  05/02/2016, 08/02/2015 FINDINGS: Postsurgical changes of the right breast/axilla. Posterior stabilization rod and fixating screws within the spine. Multiple thoracic compression deformities at the lower thoracic spine, grossly unchanged. Diffuse interstitial opacities likely reflect chronic change. Left mid lung zone airspace opacity. Tiny bilateral pleural effusions or CP angle thickening. Stable cardiomediastinal silhouette. No  pneumothorax. IMPRESSION: 1. Oval opacity left mid lung zone could relate to an area of inflammatory infiltrate. 2. Diffuse interstitial opacities, likely reflecting chronic change. Tiny bilateral effusions or pleural thickening. 3. Known metastatic pulmonary nodules are better appreciated on recent PET-CT. 4. Moderate severe compression deformities of the lower thoracic spine, grossly unchanged. Electronically Signed   By: Donavan Foil M.D.   On: 05/22/2016 18:49   Ct Head W & Wo Contrast  Result Date: 05/23/2016 CLINICAL DATA:  75 year old hypertensive female with metastatic breast cancer with ongoing chemotherapy. Headaches. Initial encounter. EXAM: CT HEAD WITHOUT AND WITH CONTRAST TECHNIQUE: Contiguous axial images were obtained from the base of the skull through the vertex without and with intravenous contrast CONTRAST:  37mL ISOVUE-300 IOPAMIDOL (ISOVUE-300) INJECTION 61% COMPARISON:  No comparison head CT or brain MR. PET CT 05/02/2006. FINDINGS: Brain: No parenchymal enhancing lesion. No CT evidence of large acute infarct. No intracranial hemorrhage. Global atrophy without hydrocephalus. Vascular: Vascular calcifications. Skull: 1 cm left frontal calvarial lucency may represent metastatic disease. Slightly sclerotic appearance of the right C1 ring may represent metastatic disease. Sinuses/Orbits: No acute orbital abnormality. Visualized sinuses and mastoid air cells/ middle ear cavities are clear. Other: Negative. IMPRESSION: No parenchymal enhancing lesion. 1 cm left frontal calvarial lucency may represent metastatic disease. Slightly sclerotic appearance of the right C1 ring may represent metastatic disease. No CT evidence of large acute infarct. Global atrophy. Electronically Signed   By: Genia Del M.D.   On: 05/23/2016 09:58   Nm Pet Image Restag (ps) Skull Base To Thigh  Result Date: 05/02/2016 CLINICAL DATA:  Subsequent treatment strategy for metastatic breast cancer. EXAM: NUCLEAR  MEDICINE PET SKULL BASE TO THIGH TECHNIQUE: 8.21 mCi F-18 FDG was injected intravenously. Full-ring PET imaging was performed from the skull base to thigh after the radiotracer. CT data was obtained and used for attenuation correction and anatomic localization. FASTING BLOOD GLUCOSE:  Value: 102 mg/dl COMPARISON:  PET-CT 12/22/2015. FINDINGS: NECK No hypermetabolic lymph nodes in the neck. However, there is some poorly defined infiltrative appearing soft tissue haziness in the in lower neck adjacent to the thyroid gland which is diffusely hypermetabolic (SUVmax = 6.3). CHEST Numerous pulmonary nodules are again noted throughout the lungs bilaterally, generally increased in size and number compared to the prior examination, with the largest of these nodules in the right lung associated with the right major fissure (image 31 of series 7) measuring 13 x 16 mm (SUVmax = 29.1). Large partially calcified pleural-based mass in the inferior aspect of the right lower lobe appear is similar to the prior examination, and is again hypermetabolic (SUVmax = [id number]). Several hypermetabolic bilateral hilar and mediastinal lymph nodes are noted, with the most hypermetabolism in a subcarinal lymph node (SUVmax = 8.1), which appears borderline enlarged measuring  approximately 8 mm. Mild cardiomegaly. There is aortic atherosclerosis, as well as atherosclerosis of the great vessels of the mediastinum and the coronary arteries, including calcified atherosclerotic plaque in the left anterior descending and right coronary arteries. Calcifications of the aortic valve. Small left pleural effusion. Mild diffuse interlobular septal thickening in the lungs, favored to reflect a background of very mild interstitial pulmonary edema. ABDOMEN/PELVIS There are some patchy areas of heterogeneous increased metabolic activity in the liver, most of which have no discrete CT correlate. However, there are some tiny capsular areas overlying the right lobe of  the liver that are partially calcified (image 93 of series 4) which correspond to hypermetabolic lesions (SUVmax = 9.6)on the PET portion of the examination, concerning for hepatic metastasis. No abnormal hypermetabolic activity within the pancreas, adrenal glands, or spleen. In the distal right common iliac nodal chain there is an enlarged lymph node measuring 12 mm in short axis (image 134 of series 4) which is hypermetabolic (SUVmax = 6.9), similar to the prior examination. Low-attenuation lesion in the lower pole of the left kidney is incompletely characterized, but presumably a parapelvic cysts, similar to the prior study. No significant volume of ascites. No pneumoperitoneum. No pathologic dilatation of small bowel or colon. Status post hysterectomy. Ovaries are not confidently identified may be surgically absent or atrophic. SKELETON Innumerable hypermetabolic lesions are noted throughout the visualized axial and appendicular skeleton. Many of these are old lesions seen on prior examinations, however, these generally appear increased in size and number. Specific examples include new lesions in the right skull base just lateral to the foramen magnum (SUVmax = 10.8), a much larger hypermetabolic lesion (SUVmax = 20.0)which is mixed lytic and sclerotic in the right scapular glenoid(image 44 of series 4), and persistent increase in areas of irregular lucency and sclerosis throughout the sacrum which are diffusely hypermetabolic (SUVmax = [id number]). Multiple other osseous lesions are also noted. Orthopedic fixation hardware in the lower thoracic and upper lumbar spine again noted. Surgical clips in the right axillary region from prior lymph node dissection. Status post right modified radical mastectomy. IMPRESSION: 1. Today's study demonstrates general progression of disease, as evidenced by increased number and size of numerous osseous lesions and pulmonary lesions, as discussed above. 2. Additional incidental  findings, as above. Electronically Signed   By: Vinnie Langton M.D.   On: 05/02/2016 14:43   Dg Chest Port 1 View  Result Date: 05/30/2016 CLINICAL DATA:  Shortness of breath. EXAM: PORTABLE CHEST 1 VIEW COMPARISON:  05/28/2016 . FINDINGS: Cardiomegaly with mild bilateral from interstitial prominence and bilateral effusions consistent congestive heart failure. No change from prior exam. Persistent left mid lung field subsegmental atelectasis. Low lung volumes. No pneumothorax. Surgical clips right axilla. Prior thoracolumbar spine fusion . IMPRESSION: 1. Congestive heart failure with pulmonary interstitial edema and bilateral pleural effusions. No change from prior exam. 2.  Left mid lung field subsegmental atelectasis. Electronically Signed   By: Marcello Moores  Register   On: 05/30/2016 07:59   Dg Chest Port 1 View  Result Date: 05/28/2016 CLINICAL DATA:  Cough. EXAM: PORTABLE CHEST 1 VIEW COMPARISON:  05/24/2016.  PET-CT 12/22/2015 FINDINGS: Cardiomegaly with mild pulmonary venous congestion and mild interstitial prominence. Mild component congestive heart for on today's exam cannot be excluded . Mild left mid lung field subsegmental atelectasis again noted. Pulmonary nodular densities previously identified are best identified on prior PET-CT. Persistent bilateral pleural effusions, right side greater than left. Surgical clips right chest. Prior thoracolumbar spine fusion . IMPRESSION:  1. Cannot exclude a mild component congestive heart failure on today's exam. 2. Persistent mild left mid lung field subsegmental atelectasis. Persistent bilateral pleural effusions right side greater than left. Known pulmonary nodules are best identified on prior PET-CT. Electronically Signed   By: Marcello Moores  Register   On: 05/28/2016 09:58   Dg Chest Port 1 View  Result Date: 05/24/2016 CLINICAL DATA:  Shortness of breath on exertion, history of breast carcinoma EXAM: PORTABLE CHEST 1 VIEW COMPARISON:  05/22/2016 FINDINGS:  Cardiac shadow is mildly enlarged. Postsurgical changes are again seen and stable. Increasing right-sided pleural effusion is noted. The known right basilar mass lesion is not well appreciated on this study. Small left effusion is noted as well. There remain some mild increased density in the left mid lung related to focal scarring. IMPRESSION: Increasing bilateral pleural effusions right greater than left. The known right basilar mass is not well appreciated on this exam. Electronically Signed   By: Inez Catalina M.D.   On: 05/24/2016 16:38    TRANSTHORACIC ECHO Study Conclusions  - Left ventricle: The cavity size was normal. Wall thickness was   normal. Systolic function was normal. The estimated ejection   fraction was in the range of 55% to 60%. Wall motion was normal;   there were no regional wall motion abnormalities. - Aortic valve: There was trivial regurgitation. - Mitral valve: Calcified annulus. Systolic bowing without   prolapse. There was moderate regurgitation directed centrally. - Left atrium: The atrium was mildly dilated. - Right atrium: The atrium was mildly dilated. - Atrial septum: No defect or patent foramen ovale was identified. - Pulmonary arteries: Systolic pressure was mildly to moderately   increased. PA peak pressure: 49 mm Hg (S).  TEE/DCCV -Done at [name]   Subjective: Seen and examined at bedside and was doing well. Had no active complaints and stated she had a goodnight and that her breathing was much improved. No other concerns or complaints at this time.  Discharge Exam: Vitals:   05/30/16 2244 05/31/16 0506  BP: (!) 119/57 (!) 141/70  Pulse: 61 61  Resp: 20 20  Temp: 98 F (36.7 C) 98.3 F (36.8 C)   Vitals:   05/30/16 2244 05/31/16 0506 05/31/16 0902 05/31/16 0903  BP: (!) 119/57 (!) 141/70    Pulse: 61 61    Resp: 20 20    Temp: 98 F (36.7 C) 98.3 F (36.8 C)    TempSrc: Oral Oral    SpO2: 98% 94% 97% 97%  Weight:  72.7 kg (160 lb  3.2 oz)    Height:       General: Pt is alert, awake, not in acute distress Cardiovascular: RRR, S1/S2 +, no rubs, no gallops Respiratory: Diminished but CTA bilaterally, no wheezing, no rhonchi Abdominal: Soft, NT, ND, bowel sounds + Extremities: no edema, no cyanosis  The results of significant diagnostics from this hospitalization (including imaging, microbiology, ancillary and laboratory) are listed below for reference.    Microbiology: No results found for this or any previous visit (from the past 240 hour(s)).   Labs: BNP (last 3 results)  Recent Labs  05/28/16 1820  BNP [id number]*   Basic Metabolic Panel:  Recent Labs Lab 05/25/16 0407 05/26/16 0555 05/27/16 0437 05/28/16 0457 05/29/16 0453 05/30/16 0452 05/31/16 0350  NA 140 140 138 138 140 139 140  K 3.6 3.0* 3.9 4.3 3.3* 4.2 3.8  CL 107 107 110 108 108 107 110  CO2 25 24 22 22 26  24  25  GLUCOSE 116* 108* 113* 107* 102* 104* 100*  BUN 27* 39* 38* 38* 34* 29* 24*  CREATININE 1.13* 1.18* 1.18* 1.22* 1.28* 1.09* 1.25*  CALCIUM 9.4 9.4 10.3 10.7* 10.5* 11.1* 11.0*  MG 2.0 2.0  --   --   --  2.1 2.1  PHOS  --   --   --   --   --  2.8 3.3   Liver Function Tests:  Recent Labs Lab 05/28/16 0457 05/31/16 0350  AST 23 26  ALT 16 15  ALKPHOS 77 72  BILITOT 1.1 0.7  PROT 6.0* 5.9*  ALBUMIN 3.3* 3.0*   No results for input(s): LIPASE, AMYLASE in the last 168 hours. No results for input(s): AMMONIA in the last 168 hours. CBC:  Recent Labs Lab 05/28/16 0457 05/29/16 0453 05/30/16 0452 05/31/16 0350  WBC 5.0 3.9* 5.2 4.7  NEUTROABS  --  2.5 3.5 3.1  HGB 11.7* 11.7* 11.9* 11.7*  HCT 34.4* 34.8* 35.4* 35.4*  MCV 94.0 96.4 96.5 94.9  PLT 234 237 238 207   Cardiac Enzymes: No results for input(s): CKTOTAL, CKMB, CKMBINDEX, TROPONINI in the last 168 hours. BNP: Invalid input(s): POCBNP CBG: No results for input(s): GLUCAP in the last 168 hours. D-Dimer No results for input(s): DDIMER in the last 72  hours. Hgb A1c No results for input(s): HGBA1C in the last 72 hours. Lipid Profile No results for input(s): CHOL, HDL, LDLCALC, TRIG, CHOLHDL, LDLDIRECT in the last 72 hours. Thyroid function studies No results for input(s): TSH, T4TOTAL, T3FREE, THYROIDAB in the last 72 hours.  Invalid input(s): FREET3 Anemia work up No results for input(s): VITAMINB12, FOLATE, FERRITIN, TIBC, IRON, RETICCTPCT in the last 72 hours. Urinalysis    Component Value Date/Time   COLORURINE YELLOW 05/22/2016 2152   APPEARANCEUR CLEAR 05/22/2016 2152   LABSPEC 1.007 05/22/2016 2152   LABSPEC 1.015 04/29/2016 1524   PHURINE 7.0 05/22/2016 2152   GLUCOSEU NEGATIVE 05/22/2016 2152   GLUCOSEU Negative 04/29/2016 1524   HGBUR NEGATIVE 05/22/2016 2152   HGBUR trace-lysed 03/08/2008 1541   BILIRUBINUR NEGATIVE 05/22/2016 2152   BILIRUBINUR Negative 04/29/2016 [place] 05/22/2016 2152   PROTEINUR NEGATIVE 05/22/2016 2152   UROBILINOGEN 0.2 04/29/2016 1524   NITRITE NEGATIVE 05/22/2016 2152   LEUKOCYTESUR NEGATIVE 05/22/2016 2152   LEUKOCYTESUR Negative 04/29/2016 1524   Sepsis Labs Invalid input(s): PROCALCITONIN,  WBC,  LACTICIDVEN Microbiology No results found for this or any previous visit (from the past 240 hour(s)).  Time coordinating discharge: Over 30 minutes  SIGNED:  Kerney Elbe, DO [facility] 05/31/2016, 11:06 AM Pager [phone]  If 7PM-7AM, please contact night-coverage [email or web address] Password [email or web address]

## 2016-05-31 NOTE — Care Management Important Message (Addendum)
Important Message  Patient Details IM Letter given to [name]/Case Manager to present to Patient Name: Lisa [name] MRN: [id number] Date of Birth: November 19, 1940   Medicare Important Message Given:  Yes    [name] 05/31/2016, 12:08 [place] Message  Patient Details  Name: Lisa [name] MRN: [id number] Date of Birth: [birth year]   Medicare Important Message Given:  Yes    [name] 05/31/2016, 12:08 PM

## 2016-05-31 NOTE — Progress Notes (Addendum)
Nutrition Brief Note  Patient identified for LOS (day 9).  Wt Readings from Last 15 Encounters:  05/31/16 160 lb 3.2 oz (72.7 kg)  05/21/16 162 lb (73.5 kg)  05/16/16 164 lb 14.4 oz (74.8 kg)  05/08/16 167 lb 3.2 oz (75.8 kg)  04/29/16 167 lb 14.4 oz (76.2 kg)  04/01/16 164 lb 14.4 oz (74.8 kg)  03/04/16 170 lb (77.1 kg)  02/29/16 165 lb 3.2 oz (74.9 kg)  02/05/16 169 lb 14.4 oz (77.1 kg)  01/18/16 171 lb 9.6 oz (77.8 kg)  01/17/16 169 lb 14.4 oz (77.1 kg)  01/10/16 172 lb 8 oz (78.2 kg)  01/01/16 165 lb 3.2 oz (74.9 kg)  12/07/15 167 lb 6.4 oz (75.9 kg)  11/23/15 169 lb 14.4 oz (77.1 kg)    Body mass index is 29.3 kg/m. Patient meets criteria for overweight based on current BMI.   Current diet order is Heart Healthy/Carb Modified, patient has been consuming mainly 75-100% of meals since time of admission. Skin WDL.  Labs reviewed; BUN: 24 mg/dL, creatinine: 1.25 mg/dL, Ca: 11 mg/dL, GFR: 42 mL/min.  Medications reviewed; 20 mg IV Lasix x1 dose yesterday, 1000 units vitamin B12 BID, 500 mg ascorbic acid/day, 200 units vitamin E/day.  No nutrition interventions warranted at this time. If nutrition issues arise, please consult RD.     Jarome Matin, MS, RD, LDN Inpatient Clinical Dietitian Pager # [phone] After hours/weekend pager # [phone]

## 2016-06-02 ENCOUNTER — Other Ambulatory Visit: Payer: Self-pay | Admitting: Oncology

## 2016-06-03 ENCOUNTER — Encounter (HOSPITAL_COMMUNITY): Payer: Self-pay | Admitting: Nurse Practitioner

## 2016-06-03 ENCOUNTER — Ambulatory Visit: Payer: Medicare Other

## 2016-06-03 ENCOUNTER — Telehealth: Payer: Self-pay | Admitting: *Deleted

## 2016-06-03 ENCOUNTER — Ambulatory Visit (HOSPITAL_COMMUNITY)
Admit: 2016-06-03 | Discharge: 2016-06-03 | Disposition: A | Discharge status: Home / Self Care | Payer: Medicare Other | Attending: Nurse Practitioner | Admitting: Nurse Practitioner

## 2016-06-03 ENCOUNTER — Ambulatory Visit
Admission: RE | Admit: 2016-06-03 | Discharge: 2016-06-03 | Disposition: A | Discharge status: Home / Self Care | Payer: Medicare Other | Source: Ambulatory Visit | Attending: Radiation Oncology | Admitting: Radiation Oncology

## 2016-06-03 VITALS — BP 136/78 | HR 59 | Ht 62.0 in | Wt 167.0 lb

## 2016-06-03 DIAGNOSIS — I129 Hypertensive chronic kidney disease with stage 1 through stage 4 chronic kidney disease, or unspecified chronic kidney disease: Secondary | ICD-10-CM | POA: Diagnosis not present

## 2016-06-03 DIAGNOSIS — Z87891 Personal history of nicotine dependence: Secondary | ICD-10-CM | POA: Diagnosis not present

## 2016-06-03 DIAGNOSIS — Z853 Personal history of malignant neoplasm of breast: Secondary | ICD-10-CM | POA: Insufficient documentation

## 2016-06-03 DIAGNOSIS — E785 Hyperlipidemia, unspecified: Secondary | ICD-10-CM | POA: Insufficient documentation

## 2016-06-03 DIAGNOSIS — M549 Dorsalgia, unspecified: Secondary | ICD-10-CM | POA: Diagnosis not present

## 2016-06-03 DIAGNOSIS — I480 Paroxysmal atrial fibrillation: Secondary | ICD-10-CM | POA: Diagnosis not present

## 2016-06-03 DIAGNOSIS — Z7901 Long term (current) use of anticoagulants: Secondary | ICD-10-CM | POA: Diagnosis not present

## 2016-06-03 DIAGNOSIS — C7951 Secondary malignant neoplasm of bone: Secondary | ICD-10-CM | POA: Diagnosis present

## 2016-06-03 DIAGNOSIS — N183 Chronic kidney disease, stage 3 (moderate): Secondary | ICD-10-CM | POA: Diagnosis not present

## 2016-06-03 DIAGNOSIS — I2510 Atherosclerotic heart disease of native coronary artery without angina pectoris: Secondary | ICD-10-CM | POA: Insufficient documentation

## 2016-06-03 DIAGNOSIS — I4891 Unspecified atrial fibrillation: Secondary | ICD-10-CM | POA: Diagnosis present

## 2016-06-03 DIAGNOSIS — R599 Enlarged lymph nodes, unspecified: Secondary | ICD-10-CM | POA: Diagnosis not present

## 2016-06-03 DIAGNOSIS — Z9011 Acquired absence of right breast and nipple: Secondary | ICD-10-CM | POA: Diagnosis not present

## 2016-06-03 DIAGNOSIS — I481 Persistent atrial fibrillation: Secondary | ICD-10-CM | POA: Insufficient documentation

## 2016-06-03 DIAGNOSIS — C50919 Malignant neoplasm of unspecified site of unspecified female breast: Secondary | ICD-10-CM | POA: Diagnosis not present

## 2016-06-03 DIAGNOSIS — G8929 Other chronic pain: Secondary | ICD-10-CM | POA: Diagnosis not present

## 2016-06-03 DIAGNOSIS — I700 Atherosclerosis of aorta: Secondary | ICD-10-CM | POA: Diagnosis not present

## 2016-06-03 DIAGNOSIS — J90 Pleural effusion, not elsewhere classified: Secondary | ICD-10-CM | POA: Diagnosis not present

## 2016-06-03 DIAGNOSIS — Z90710 Acquired absence of both cervix and uterus: Secondary | ICD-10-CM | POA: Diagnosis not present

## 2016-06-03 DIAGNOSIS — I517 Cardiomegaly: Secondary | ICD-10-CM | POA: Diagnosis not present

## 2016-06-03 MED FILL — SIMVASTATIN 20 MG TABLET: 20 | 90 days supply | Qty: 90 | Fill #1

## 2016-06-03 NOTE — Telephone Encounter (Signed)
Told Lisa [name] that Dr. Marko Plume said that if she feels she can take her treatment on Thursday then on  Wednesday take the decadron, if not hold the decadron. Lisa Chue verbalized understanding.

## 2016-06-03 NOTE — Progress Notes (Signed)
Primary Care Physician: [name], MD Referring Physician: [facility] f/u Cardiologist: to establish with Dr. Jeannetta Nap is a 75 y.o. 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 difficile colitis, CKD-2, who presented with irregular heartbeat at time of a radiation treatment. She was admitted and found to be in acute diastolic heart failure due to afib with rvr. Diuresed, IV Cardizem started and had successful cardioversion. Discharged on amiodarone 200 mg bid and eliquis 5 mg bid for CHA2DS2VASc of 3. She has not had any recurrent afib. No bleeding issues.    Today, she denies symptoms of palpitations, chest pain, shortness of breath, orthopnea, PND, lower extremity edema, dizziness, presyncope, syncope, or neurologic sequela. The patient is tolerating medications without difficulties and is otherwise without complaint today.   Past Medical History:  Diagnosis Date  . Breast cancer ([place]) 07-13-1997   right  . Chronic back pain   . CKD (chronic kidney disease), stage III   . Complication of anesthesia   . Coronary artery calcification of native artery    a. seen on PET scan 04/2016.  [name] Hypertension   . Pericardial effusion    a. small by TEE 05/2016.  [name] Persistent atrial fibrillation ([place])   . Pleural effusion 11-08-2008  . PONV (postoperative nausea and vomiting)   . Radiation 08/17/2015-08/29/2015   lower thoracic spine 22.5 gray  . Radiation 01/17/16-01/24/16   right femur 20Gy   Past Surgical History:  Procedure Laterality Date  . ABDOMINAL HYSTERECTOMY  02-21-1982  . BREAST BIOPSY  07-13-1997  . CARDIOVERSION N/A 05/27/2016   Procedure: CARDIOVERSION;  Surgeon: [name], MD;  Location: [place];  Service: Cardiovascular;  Laterality: N/A;  . DILATION AND CURETTAGE, DIAGNOSTIC / THERAPEUTIC  12-13-1981  . MASTECTOMY  07-29-1997  . PELVIC LAPAROSCOPY  11-06-1981  . POSTERIOR LUMBAR FUSION 4 LEVEL N/A 09/01/2015   Procedure: Thoracic six-Lumbar one fusion with arthrodesis pedicle screw stabilization and decompression;  Surgeon: Ashok Pall, MD;  Location: [place] [facility];  Service: Neurosurgery;  Laterality: N/A;  T6-L1 fusion with arthrodesis pedicle screw stabilization and decompression  . TEE WITHOUT CARDIOVERSION N/A 05/27/2016   Procedure: TRANSESOPHAGEAL ECHOCARDIOGRAM (TEE);  Surgeon: [name], MD;  Location: [name] ENDOSCOPY;  Service: Cardiovascular;  Laterality: N/A;  . THORACENTESIS  10-18-2008    Current Outpatient Prescriptions  Medication Sig Dispense Refill  . acetaminophen (TYLENOL) 325 MG tablet Take 325 mg by mouth every 6 (six) hours as needed for mild pain. Reported on 01/10/2016    . amiodarone (PACERONE) 200 MG tablet Take 1 tablet (200 mg total) by mouth 2 (two) times daily. 60 tablet 0  . apixaban (ELIQUIS) 5 MG TABS tablet Take 1 tablet (5 mg total) by mouth 2 (two) times daily. 60 tablet 0  . dexamethasone (DECADRON) 4 MG tablet Take 5 tabs (20 mg) 12 hours and 6 hours before taxol for first chemo. Take 5 tabs (20mg ) 12 hours before taxol for subsequent treatment. 20 tablet 2  . fluticasone (FLONASE) 50 MCG/ACT nasal spray Place 1 spray into both nostrils 2 (two) times daily. 16 g 2  . loratadine (CLARITIN) 10 MG tablet Take 10 mg by mouth daily.     . metoprolol tartrate (LOPRESSOR) 25 MG tablet Take 1 tablet (25  mg total) by mouth 2 (two) times daily. 60 tablet 0  . ondansetron (ZOFRAN) 8 MG tablet Take 1 tablet (8 mg total) by mouth every 8 (eight) hours as needed for nausea or vomiting. 20 tablet 2  . Potassium 99 MG TABS Take 1 tablet by mouth daily.    . simvastatin (ZOCOR) 20 MG tablet Take 20 mg by mouth at bedtime.      . traMADol (ULTRAM) 50 MG  tablet Take 50-100 mg by mouth every 8 (eight) hours as needed for moderate pain.    . vitamin B-12 (CYANOCOBALAMIN) 1000 MCG tablet Take 1,000 mcg by mouth 2 (two) times daily.    . vitamin C (ASCORBIC ACID) 500 MG tablet Take 500 mg by mouth daily.      . vitamin E 200 UNIT capsule Take 200 Units by mouth daily.       No current facility-administered medications for this encounter.     Allergies  Allergen Reactions  . Diphenhydramine Hcl Other (See Comments)    Severe headaches  . Codeine Sulfate Rash    REACTION: rash. Makes her jittery  . Oseltamivir Phosphate Nausea And Vomiting    REACTION: rash  . Oxycodone-Acetaminophen Nausea And Vomiting    REACTION: emesis  . Prednisone Other (See Comments)    REACTION: hiatal hernia, runny  Nose, felt terrible  . Sudafed [Pseudoephedrine Hcl] Other (See Comments)    vertigo    Social History   Social History  . Marital status: Single    Spouse name: N/A  . Number of children: 0  . Years of education: N/A   Occupational History  . Administraton for [employer]    Social History Main Topics  . Smoking status: Former Smoker    Packs/day: 0.50    Years: 30.00    Types: Cigarettes    Quit date: 07/22/1996  . Smokeless tobacco: Never Used     Comment: 1/2 up to 1 ppd  . Alcohol use No     Comment: hx of alcohol socially - hasn't had drink since March 1998  . Drug use: No  . Sexual activity: Not on file   Other Topics Concern  . Not on file   Social History Narrative  . No narrative on file    Family History  Problem Relation Age of Onset  . Heart disease Mother     with pacemaker   . Asthma Father   . Heart attack Maternal Grandfather 64  . Diabetes Paternal Grandmother   . Aortic stenosis Maternal Uncle   . Prostate cancer Maternal Uncle     dx unspecified age  . Prostate cancer Maternal Uncle     dx older than 50y  . Prostate cancer Maternal Uncle     dx older than 50y  . Prostate cancer  Maternal Uncle 72    s/p prostatectomy  . Breast cancer Cousin 77    maternal 1st cousin; s/p lumpectomy and radiation  . Breast cancer Cousin     maternal 1st cousin dx mid-late 67s; s/p mastectomy    ROS- All systems are reviewed and negative except as per the HPI above  Physical Exam: Vitals:   06/03/16 1000  BP: 136/78  Pulse: (!) 59  Weight: 167 lb (75.8 kg)  Height: 5\' 2"  (1.575 m)    GEN- The patient is well appearing, alert and oriented x 3 today.   Head- normocephalic, atraumatic Eyes-  Sclera clear, conjunctiva pink Ears- hearing intact Oropharynx- clear  Neck- supple, no JVP Lymph- no cervical lymphadenopathy Lungs- Clear to ausculation bilaterally, normal work of breathing Heart- Regular rate and rhythm, no murmurs, rubs or gallops, PMI not laterally displaced GI- soft, NT, ND, + BS Extremities- no clubbing, cyanosis, or edema MS- no significant deformity or atrophy Skin- no rash or lesion Psych- euthymic mood, full affect Neuro- strength and sensation are intact  EKG-sinus brady at 59 bpm, pr int 184 ms, qrs int 90 ms, qtc 419 ms Echo-Left ventricle: The cavity size was normal. Wall thickness was   normal. Systolic function was normal. The estimated ejection   fraction was in the range of 55% to 60%. Wall motion was normal;   there were no regional wall motion abnormalities. - Aortic valve: There was trivial regurgitation. - Mitral valve: Calcified annulus. Systolic bowing without   prolapse. There was moderate regurgitation directed centrally. - Left atrium: The atrium was mildly dilated. - Right atrium: The atrium was mildly dilated. - Atrial septum: No defect or patent foramen ovale was identified. - Pulmonary arteries: Systolic pressure was mildly to moderately   increased. PA peak pressure: 49 mm Hg (S).  Assessment and Plan: 1.New onset afib with rvr Successful cardioversion Continue loading amiodarone at 200 mg bid and reduce to 200 mg daily  11/24 Continue eliquis 5 mg bid Bleeding precautions discussed General information re afib management No triggers identified  2. Breast cancer metastasized to multiple sites Per oncologist Dr. Marko Plume and radiation oncologist Dr. Sondra Come F/u as been scheduled for 11/14   Appointment has been scheduled with Dr. Sallyanne Kuster within 4 weeks afib clinic as needed  Butch Penny C. Carroll, [facility] [address] [place], [place] [address] [phone]

## 2016-06-03 NOTE — Telephone Encounter (Signed)
Call received from patient asking to speak with Barbaraann Share at Dr. Mariana Kaufman desk.  Patient would like to know if she is going to have treatment this Thursday and if she needs to take her steroids prior to treatment.  Patient forwarded to [name] at Dr. Mariana Kaufman desk.

## 2016-06-04 ENCOUNTER — Ambulatory Visit
Admission: RE | Admit: 2016-06-04 | Discharge: 2016-06-04 | Disposition: A | Discharge status: Home / Self Care | Payer: Medicare Other | Source: Ambulatory Visit | Attending: Radiation Oncology | Admitting: Radiation Oncology

## 2016-06-04 ENCOUNTER — Ambulatory Visit: Payer: Medicare Other

## 2016-06-04 ENCOUNTER — Encounter: Payer: Self-pay | Admitting: Radiation Oncology

## 2016-06-04 VITALS — BP 134/64 | HR 54 | Temp 97.7°F | Resp 20 | Wt 168.2 lb

## 2016-06-04 DIAGNOSIS — C7951 Secondary malignant neoplasm of bone: Principal | ICD-10-CM

## 2016-06-04 DIAGNOSIS — C50919 Malignant neoplasm of unspecified site of unspecified female breast: Secondary | ICD-10-CM | POA: Diagnosis not present

## 2016-06-04 NOTE — Progress Notes (Signed)
wekly rad txs right ilium, 6/8 no skin irritation, no c/o pain, no nausea, appetite good, tires quickly, chemo this Thursday if labs okay and Dr. Marko Plume sees patient need 1 month follow up appt BP 134/64 (BP Location: Left Arm, Patient Position: Sitting, Cuff Size: Normal)   Pulse (!) 54   Temp 97.7 F (36.5 C) (Oral)   Resp 20   Wt 168 lb 3.2 oz (76.3 kg)   BMI 30.76 kg/m   Wt Readings from Last 3 Encounters:  06/04/16 168 lb 3.2 oz (76.3 kg)  06/03/16 167 lb (75.8 kg)  05/31/16 160 lb 3.2 oz (72.7 kg)

## 2016-06-04 NOTE — Progress Notes (Signed)
  Radiation Oncology         [phone] ________________________________  Name: Lisa [name] MRN: [id number]  Date: 06/04/2016  DOB: 02/21/1941  Weekly Radiation Therapy Management    ICD-9-CM ICD-10-CM   1. Carcinoma of breast metastatic to bone, unspecified laterality (HCC) 174.9 C50.919    198.5 C79.51      Current Dose: 21 Gy     Planned Dose:  28 Gy  Narrative . . . . . . . . The patient presents for routine under treatment assessment.                                   The patient is without complaint. No right hip pain at this time.  She denies any nausea                                 Set-up films were reviewed.                                 The chart was checked. Physical Findings. . .  weight is 168 lb 3.2 oz (76.3 kg). Her oral temperature is 97.7 F (36.5 C). Her blood pressure is 134/64 and her pulse is 54 (abnormal). Her respiration is 20. . Lungs are clear to auscultation bilaterally. Heart has regular rate and rhythm. No palpable cervical, supraclavicular, or axillary adenopathy. Abdomen soft, non-tender, normal bowel sounds. Radial pulse strong and regular.  Impression . . . . . . . The patient is tolerating radiation. Plan . . . . . . . . . . . . Continue treatment as planned.  ________________________________   Blair Promise, PhD, MD

## 2016-06-05 ENCOUNTER — Ambulatory Visit: Payer: Medicare Other

## 2016-06-05 ENCOUNTER — Ambulatory Visit
Admission: RE | Admit: 2016-06-05 | Discharge: 2016-06-05 | Disposition: A | Discharge status: Home / Self Care | Payer: Medicare Other | Source: Ambulatory Visit | Attending: Radiation Oncology | Admitting: Radiation Oncology

## 2016-06-05 DIAGNOSIS — C50919 Malignant neoplasm of unspecified site of unspecified female breast: Secondary | ICD-10-CM | POA: Diagnosis not present

## 2016-06-06 ENCOUNTER — Telehealth: Payer: Self-pay

## 2016-06-06 ENCOUNTER — Ambulatory Visit
Admission: RE | Admit: 2016-06-06 | Discharge: 2016-06-06 | Disposition: A | Discharge status: Home / Self Care | Payer: Medicare Other | Source: Ambulatory Visit | Attending: Radiation Oncology | Admitting: Radiation Oncology

## 2016-06-06 ENCOUNTER — Ambulatory Visit: Payer: Medicare Other

## 2016-06-06 ENCOUNTER — Other Ambulatory Visit (HOSPITAL_BASED_OUTPATIENT_CLINIC_OR_DEPARTMENT_OTHER): Payer: Medicare Other

## 2016-06-06 ENCOUNTER — Ambulatory Visit (HOSPITAL_BASED_OUTPATIENT_CLINIC_OR_DEPARTMENT_OTHER): Payer: Medicare Other | Admitting: Oncology

## 2016-06-06 ENCOUNTER — Encounter: Payer: Self-pay | Admitting: Radiation Oncology

## 2016-06-06 ENCOUNTER — Encounter: Payer: Self-pay | Admitting: Oncology

## 2016-06-06 VITALS — BP 131/56 | HR 68 | Temp 98.1°F | Resp 18 | Ht 62.0 in | Wt 167.7 lb

## 2016-06-06 DIAGNOSIS — C7951 Secondary malignant neoplasm of bone: Secondary | ICD-10-CM

## 2016-06-06 DIAGNOSIS — C50911 Malignant neoplasm of unspecified site of right female breast: Secondary | ICD-10-CM | POA: Diagnosis not present

## 2016-06-06 DIAGNOSIS — C7800 Secondary malignant neoplasm of unspecified lung: Secondary | ICD-10-CM

## 2016-06-06 DIAGNOSIS — I4891 Unspecified atrial fibrillation: Secondary | ICD-10-CM | POA: Diagnosis not present

## 2016-06-06 DIAGNOSIS — Z7901 Long term (current) use of anticoagulants: Secondary | ICD-10-CM | POA: Diagnosis not present

## 2016-06-06 DIAGNOSIS — C50919 Malignant neoplasm of unspecified site of unspecified female breast: Secondary | ICD-10-CM | POA: Diagnosis not present

## 2016-06-06 LAB — CBC WITH DIFFERENTIAL/PLATELET
BASO%: 0.2 % (ref 0.0–2.0)
BASOS ABS: 0 10*3/uL (ref 0.0–0.1)
EOS%: 0 % (ref 0.0–7.0)
Eosinophils Absolute: 0 10*3/uL (ref 0.0–0.5)
HEMATOCRIT: 38.5 % (ref 34.8–46.6)
HEMOGLOBIN: 12.8 g/dL (ref 11.6–15.9)
LYMPH#: 0.4 10*3/uL — AB (ref 0.9–3.3)
LYMPH%: 6.2 % — ABNORMAL LOW (ref 14.0–49.7)
MCH: 31.5 pg (ref 25.1–34.0)
MCHC: 33.2 g/dL (ref 31.5–36.0)
MCV: 94.8 fL (ref 79.5–101.0)
MONO#: 0.1 10*3/uL (ref 0.1–0.9)
MONO%: 0.9 % (ref 0.0–14.0)
NEUT#: 6.2 10*3/uL (ref 1.5–6.5)
NEUT%: 92.7 % — AB (ref 38.4–76.8)
PLATELETS: 243 10*3/uL (ref 145–400)
RBC: 4.06 10*6/uL (ref 3.70–5.45)
RDW: 14.9 % — ABNORMAL HIGH (ref 11.2–14.5)
WBC: 6.7 10*3/uL (ref 3.9–10.3)

## 2016-06-06 LAB — COMPREHENSIVE METABOLIC PANEL
ALBUMIN: 3.2 g/dL — AB (ref 3.5–5.0)
ALK PHOS: 97 U/L (ref 40–150)
ALT: 20 U/L (ref 0–55)
AST: 23 U/L (ref 5–34)
Anion Gap: 13 mEq/L — ABNORMAL HIGH (ref 3–11)
BILIRUBIN TOTAL: 0.71 mg/dL (ref 0.20–1.20)
BUN: 23 mg/dL (ref 7.0–26.0)
CALCIUM: 10.8 mg/dL — AB (ref 8.4–10.4)
CHLORIDE: 111 meq/L — AB (ref 98–109)
CO2: 16 mEq/L — ABNORMAL LOW (ref 22–29)
CREATININE: 1.4 mg/dL — AB (ref 0.6–1.1)
EGFR: 37 mL/min/{1.73_m2} — ABNORMAL LOW (ref >90)
Glucose: 288 mg/dl — ABNORMAL HIGH (ref 70–140)
Potassium: 4.2 mEq/L (ref 3.5–5.1)
Sodium: 139 mEq/L (ref 136–145)
Total Protein: 6.9 g/dL (ref 6.4–8.3)

## 2016-06-06 MED ORDER — POTASSIUM CHLORIDE ER 10 MEQ PO TBCR: 10.0000 meq | EXTENDED_RELEASE_TABLET | Freq: Every day | ORAL | 0 refills | Status: DC

## 2016-06-06 MED FILL — POTASSIUM CL ER 10 MEQ TAB: 10 | 30 days supply | Qty: 30 | Fill #0

## 2016-06-06 NOTE — Telephone Encounter (Signed)
-----   Message from Gordy Levan, MD sent at 06/06/2016 10:17 AM EST ----- Regarding: potassium script Potassium 10 mEq   One daily or as directed   #30

## 2016-06-06 NOTE — Progress Notes (Signed)
OFFICE PROGRESS NOTE   June 06, 2016   Physicians: J.Kinard, [name] (PCP), (R.Sharma), A.Kendall. K.Cabbell.,M.Croitoru  INTERVAL HISTORY:  Patient is seen, together with aunt, in continuing attention to extensively metastatic breast cancer involving lung and bone, for which she received single agent taxol 05-09-16 and 05-16-16.  She was hospitalized for new atrial fibrillation with RVR from 11-1 thru 05-31-16. She had successful cardioversion on 05-27-16. She continues Eliquis, amiodarone and lopressor. Patient was seen in atrial fibrillation clinic on 06-03-16 and will have follow up at Dr Shon Baton office on 06-07-16. She is to see Dr Sallyanne Kuster in early Jan.   Radiation to right hip will complete 06-06-16.  Last zometa 04-01-16   Patient is not as weak as with the a fib, but remains SOB with exertion or with "too much talking"; she had some pulmonary edema in hospital, improved with lasix. She feels "congestion in chest" when she lies supine, better when she propped up to sleep last pm. She denies productive cough, has some intermittent wheezing (improved also with xopenex in hospital).  She has no pain now in right hip or back. She is eating well. Bowels ok. No fever. No swelling LE. No bleeding. Remainder of 10 point Review of Systems negative / unchanged.    ONCOLOGIC HISTORY  Right breast carcinoma T2 N1, ER/PR-positive December 1998, treated with mastectomy with 12 node axillary evaluation and adjuvant Adriamycin/Cytoxan followed by 5 years of tamoxifen through December 2003. 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 CA 2729 was 434 in April 2010. She has tolerated Femara well and clinically has continued to do very well, though CA 27.29 has never normalized. Metastatic disease to T spine with pathologic fracture T10, and to right iliac wing  identified on imaging 07-2015. Patient seen by medical oncology 08-10-15 and radiation begun shortly afterwards by Dr Sondra Come, 22.5 of planned 35 Gy radiation to area of T10 thru 08-29-15. She had first zometa 08-11-15. MRI T spine obtained after some difficulty on 08-29-15, with pathologic compression fracture T10 with retropulsion of compressed vertebra to right with compression of cord called, involvement also T9, T11, L2. She had thoracic six- lumbar one posterolateral arthrodesis, T10 laminectomy, partial laminectomy T9, T11 by Dr Christella Noa on 09-01-15. She was begun on Aromasin on 09-07-15.  PET had been scheduled for 09-04-15, delayed with surgery, accomplished on 09-20-15 with chest adenopathy, increased soft tissue right lung base, extensive bone involvement.  Repeat PET 12-22-15 some improvement in area of radiation T spine, otherwise no improvement in bones, some increase right femur, some central chest adenopathy and right lower chest/ pleural involvement. Aromasin was discontinued and xeloda begun 01-05-16, used thru 04-29-16.  BRCA negative by [facility] 28 gene panel by [facility] 03-2016. Progressed in lung and bones by PET 05-02-16. Weekly taxol began 05-09-16.Radiation to right pelvis / hip begun ~ 05-21-16.   Objective:  Vital signs in last 24 hours:  BP (!) 131/56 (BP Location: Left Arm, Patient Position: Sitting)   Pulse 68   Temp 98.1 F (36.7 C) (Oral)   Resp 18   Ht 5' 2"  (1.575 m)   Wt 167 lb 11.2 oz (76.1 kg)   SpO2 99%   BMI 30.67 kg/m   Weight up 3 lbs Alert, oriented and appropriate. Ambulatory with rolling walker. Not dyspneic on RA in exam room Partial alopecia  HEENT:PERRL, sclerae not icteric. Oral mucosa  moist without lesions, posterior pharynx clear.  No JVD.  Lymphatics:no supraclavicular adenopathy Resp: diminished BS right lower 1/4 and slight expiratory wheeze right upper posterior, otherwise clear to auscultation bilaterally  Cardio: regular rate and rhythm. No gallop. GI:  soft, nontender, not distended. Normally active bowel sounds Musculoskeletal/ Extremities: without pitting edema, cords, tenderness Neuro: no peripheral neuropathy. Otherwise nonfocal Skin ecchymoses bilateral forearms at sites of IV access, otherwise without rash,  petechiae  Lab Results:  Results for orders placed or performed in visit on 06/06/16  CBC with Differential  Result Value Ref Range   WBC 6.7 3.9 - 10.3 10e3/uL   NEUT# 6.2 1.5 - 6.5 10e3/uL   HGB 12.8 11.6 - 15.9 g/dL   HCT 38.5 34.8 - 46.6 %   Platelets 243 145 - 400 10e3/uL   MCV 94.8 79.5 - 101.0 fL   MCH 31.5 25.1 - 34.0 pg   MCHC 33.2 31.5 - 36.0 g/dL   RBC 4.06 3.70 - 5.45 10e6/uL   RDW 14.9 (H) 11.2 - 14.5 %   lymph# 0.4 (L) 0.9 - 3.3 10e3/uL   MONO# 0.1 0.1 - 0.9 10e3/uL   Eosinophils Absolute 0.0 0.0 - 0.5 10e3/uL   Basophils Absolute 0.0 0.0 - 0.1 10e3/uL   NEUT% 92.7 (H) 38.4 - 76.8 %   LYMPH% 6.2 (L) 14.0 - 49.7 %   MONO% 0.9 0.0 - 14.0 %   EOS% 0.0 0.0 - 7.0 %   BASO% 0.2 0.0 - 2.0 %  Comprehensive metabolic panel  Result Value Ref Range   Sodium 139 136 - 145 mEq/L   Potassium 4.2 3.5 - 5.1 mEq/L   Chloride 111 (H) 98 - 109 mEq/L   CO2 16 (L) 22 - 29 mEq/L   Glucose 288 (H) 70 - 140 mg/dl   BUN 23.0 7.0 - 26.0 mg/dL   Creatinine 1.4 (H) 0.6 - 1.1 mg/dL   Total Bilirubin 0.71 0.20 - 1.20 mg/dL   Alkaline Phosphatase 97 40 - 150 U/L   AST 23 5 - 34 U/L   ALT 20 0 - 55 U/L   Total Protein 6.9 6.4 - 8.3 g/dL   Albumin 3.2 (L) 3.5 - 5.0 g/dL   Calcium 10.8 (H) 8.4 - 10.4 mg/dL   Anion Gap 13 (H) 3 - 11 mEq/L   EGFR 37 (L) >90 ml/min/1.73 m2   Blood sugar elevated on steroid premed for taxol  Studies/Results:  No results found.  Medications: I have reviewed the patient's current medications.  DISCUSSION Patient does not seem well enough today to give taxol as planned, with fatigue and SOB despite premedication decadron for the taxol. She will have last planned radiation today, then will  resume taxol and zometa (or change to xgeva) hopefully in next 1-2 weeks.  As she was seen in a fib clinic 2 days ago and is to see PCP tomorrow, I have not gotten CXR today.   Patient questioning timing of follow up appointment 06-10-16 with Dr Christella Noa, this set up prior to starting present chemo and radiation; she does not feel able to manage this appointment. I have let that office know and will send this note to update.   We did not discuss follow up in Jan.   Assessment/Plan: 1. progressive metastatic breast cancer lungs andextensive bone involvement by PET 05-02-16: treatment changed to weekly taxol beginning 05-09-16. Will resume taxol when possible. Palliative radiation to right iliac area begun 10-31, to complete today.  Prior to present taxol, no  significant improvement on Aromasin x 4 months thru 01-02-16 and progression onxeloda 12-2015 thru 04-29-16. Last zometa 04-01-16, held with increased creatinine in Oct. .Pathologic compression fracture T10 and retropulsion of bone impinging on spinal cord for which she had T6 - L1 posterolateral arthrodesis with T10 laminectomy and T9/ T11 partial laminectomy by Dr Christella Noa on 2-10-1. Previous radiation to right hip and right femur helpful. 2. Atrial fibrillation with RVR: hospitalized 11-1 thru 05-31-16, cardioversion 05-27-16, on Eliquis, lopressor, amiodarone. Follow up in a fib clinic and by Dr Sallyanne Kuster.  She is also to see PCP on 06-07-16. Lasix and xopenex nebs helpful in hospital 3.. Previous hypercalcemia: follow, no IV bisphosphonate since 04-01-16 due to more elevated creatinine  4..needs flu vaccine, tho has refused thus far 5.diarrhea Jan treated for C diff ( Ab + toxin -) and resolved. 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  8.Advance Directives in place .9. Deconditioning multifactorial.   All questions answered and patient is in agreement with  recommendations and plans now, knows to call if concerns prior to next scheduled appointments. Time spent 25 min including >50% counseling and coordination of care.     Evlyn Clines, MD   06/06/2016, 5:42 PM

## 2016-06-07 MED FILL — DEXAMETHASONE 4 MG TABLET: 4 | 3 days supply | Qty: 20 | Fill #1

## 2016-06-10 ENCOUNTER — Encounter: Payer: Self-pay | Admitting: Genetic Counselor

## 2016-06-13 ENCOUNTER — Other Ambulatory Visit: Payer: Self-pay | Admitting: Oncology

## 2016-06-14 ENCOUNTER — Ambulatory Visit (HOSPITAL_BASED_OUTPATIENT_CLINIC_OR_DEPARTMENT_OTHER): Payer: Medicare Other

## 2016-06-14 ENCOUNTER — Other Ambulatory Visit: Payer: Self-pay

## 2016-06-14 ENCOUNTER — Ambulatory Visit (HOSPITAL_COMMUNITY)
Admission: RE | Admit: 2016-06-14 | Discharge: 2016-06-14 | Disposition: A | Discharge status: Home / Self Care | Payer: Medicare Other | Source: Ambulatory Visit | Attending: Oncology | Admitting: Oncology

## 2016-06-14 ENCOUNTER — Encounter: Payer: Self-pay | Admitting: Oncology

## 2016-06-14 ENCOUNTER — Ambulatory Visit (HOSPITAL_BASED_OUTPATIENT_CLINIC_OR_DEPARTMENT_OTHER): Payer: Medicare Other | Admitting: Oncology

## 2016-06-14 VITALS — BP 152/70 | HR 65 | Temp 97.9°F | Resp 17 | Ht 62.0 in | Wt 170.5 lb

## 2016-06-14 DIAGNOSIS — I509 Heart failure, unspecified: Secondary | ICD-10-CM

## 2016-06-14 DIAGNOSIS — C7800 Secondary malignant neoplasm of unspecified lung: Secondary | ICD-10-CM

## 2016-06-14 DIAGNOSIS — I4891 Unspecified atrial fibrillation: Secondary | ICD-10-CM | POA: Diagnosis not present

## 2016-06-14 DIAGNOSIS — M7989 Other specified soft tissue disorders: Secondary | ICD-10-CM | POA: Diagnosis not present

## 2016-06-14 DIAGNOSIS — R0602 Shortness of breath: Secondary | ICD-10-CM

## 2016-06-14 DIAGNOSIS — N183 Chronic kidney disease, stage 3 unspecified: Secondary | ICD-10-CM | POA: Insufficient documentation

## 2016-06-14 DIAGNOSIS — Z7901 Long term (current) use of anticoagulants: Secondary | ICD-10-CM | POA: Insufficient documentation

## 2016-06-14 DIAGNOSIS — C50911 Malignant neoplasm of unspecified site of right female breast: Secondary | ICD-10-CM

## 2016-06-14 LAB — COMPREHENSIVE METABOLIC PANEL
ALBUMIN: 2.9 g/dL — AB (ref 3.5–5.0)
ALK PHOS: 92 U/L (ref 40–150)
ALT: 15 U/L (ref 0–55)
ANION GAP: 7 meq/L (ref 3–11)
AST: 23 U/L (ref 5–34)
BILIRUBIN TOTAL: 0.72 mg/dL (ref 0.20–1.20)
BUN: 18.9 mg/dL (ref 7.0–26.0)
CALCIUM: 10.4 mg/dL (ref 8.4–10.4)
CO2: 22 meq/L (ref 22–29)
CREATININE: 1.2 mg/dL — AB (ref 0.6–1.1)
Chloride: 110 mEq/L — ABNORMAL HIGH (ref 98–109)
EGFR: 45 mL/min/{1.73_m2} — AB (ref >90)
Glucose: 97 mg/dl (ref 70–140)
Potassium: 4 mEq/L (ref 3.5–5.1)
Sodium: 140 mEq/L (ref 136–145)
TOTAL PROTEIN: 6.2 g/dL — AB (ref 6.4–8.3)

## 2016-06-14 LAB — CBC WITH DIFFERENTIAL/PLATELET
BASO%: 1 % (ref 0.0–2.0)
Basophils Absolute: 0 10*3/uL (ref 0.0–0.1)
EOS ABS: 0.3 10*3/uL (ref 0.0–0.5)
EOS%: 5.7 % (ref 0.0–7.0)
HEMATOCRIT: 36.6 % (ref 34.8–46.6)
HEMOGLOBIN: 12 g/dL (ref 11.6–15.9)
LYMPH#: 0.4 10*3/uL — AB (ref 0.9–3.3)
LYMPH%: 9.5 % — ABNORMAL LOW (ref 14.0–49.7)
MCH: 31.1 pg (ref 25.1–34.0)
MCHC: 32.9 g/dL (ref 31.5–36.0)
MCV: 94.7 fL (ref 79.5–101.0)
MONO#: 0.7 10*3/uL (ref 0.1–0.9)
MONO%: 14.9 % — ABNORMAL HIGH (ref 0.0–14.0)
NEUT%: 68.9 % (ref 38.4–76.8)
NEUTROS ABS: 3.2 10*3/uL (ref 1.5–6.5)
PLATELETS: 204 10*3/uL (ref 145–400)
RBC: 3.87 10*6/uL (ref 3.70–5.45)
RDW: 15.5 % — ABNORMAL HIGH (ref 11.2–14.5)
WBC: 4.7 10*3/uL (ref 3.9–10.3)

## 2016-06-14 MED ORDER — FUROSEMIDE 40 MG PO TABS: ORAL_TABLET | ORAL | 0 refills | Status: DC

## 2016-06-14 MED FILL — FUROSEMIDE 40 MG TABLET: 40 | 3 days supply | Qty: 3 | Fill #0

## 2016-06-14 NOTE — Progress Notes (Signed)
*  Preliminary Results* Bilateral lower extremity venous duplex completed. Bilateral lower extremities are negative for deep vein thrombosis. There is no evidence of Baker's cyst bilaterally.  06/14/2016 11:36 AM Maudry Mayhew, BS, RVT, RDCS, RDMS

## 2016-06-14 NOTE — Progress Notes (Signed)
OFFICE PROGRESS NOTE   June 14, 2016   Physicians:  J.Kinard, [name] (PCP), (R.Sharma), A.Kendall. 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 Plan is to resume taxol on 06-20-16 if stable. She completed radiation to right hip 06-06-16. Follow up Dr Sondra Come 07-25-16 She saw Dr Shon Baton associate on 06-07-16, no changes to meds per patient. Next scheduled cardiology appointment is 07-29-16, follow up previous atrial fibrillation  Patient has had new swelling bilateral LE since 06-10-16, without pain, does elevate legs frequently. She continues to notice SOB with exertion or when she tries to lie supine "lying flat makes me wheeze" such that she props up with 2 pillows to sleep.She eats out daily, likely more salt than realizes.   Heart rhythm regular at PCP and by Dr Sondra Come since last here. She has some cough, not productive, but rattling at times. She denies chest pain or palpitations. No bleeding on Eliquis. No upper respiratory congestion. Amount of urination has decreased, but she has also been limiting po fluids to avoid nocturia; she was still up x4 to void last pm. She denies any pain now in right groin, still some discomfort in right hip joint tho this is improved. Some indigestion "from drinking milk". No nausea or vomiting. Bowels ok. Slept poorly last pm, not clear why. No dysuria. No fever.  Remainder of 10 point Review of Systems negative.   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 The breast cancer  was found metastatic to pleura, hormone positive in early 2010, treated with VATS sclerosis by Dr.Burney April 2010 and on Femara also since April 2010. CA 2729 was 434 in April 2010. She has tolerated Femara well and clinically has continued to do very well, though CA 27.29 has never normalized. 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 Patient seen by medical oncology 08-10-15 and radiation begun shortly afterwards by Dr Sondra Come, 22.5 of planned 35 Gy radiation to area of T10 thru 08-29-15. She had first zometa 08-11-15. MRI T spine obtained after some difficulty on 08-29-15, with pathologic compression fracture T10 with retropulsion of compressed vertebra to right with compression of cord called, involvement also T9, T11, L2. She had thoracic six- lumbar one posterolateral arthrodesis, T10 laminectomy, partial laminectomy T9, T11 by Dr Christella Noa on 09-01-15. She was begun on Aromasin on 09-07-15.  PET had been scheduled for 09-04-15, delayed with surgery, accomplished on 09-20-15 with chest adenopathy, increased soft tissue right lung base, extensive bone involvement.  Repeat PET 12-22-15 some improvement in area of radiation T spine, otherwise no improvement in bones, some increase right femur, some central chest adenopathy and right lower chest/ pleural involvement. Aromasin was discontinued and xeloda begun 01-05-16, used thru 04-29-16.  BRCA negative by [name] 28 gene panel by [facility] 03-2016. Progressed in lung and bones by PET 05-02-16. Weekly taxol began 05-09-16.Radiation to right pelvis / hip ~ 05-21-16 thru 06-06-16, 28 Gy   Objective:  Vital signs in last 24 hours:  BP (!) 152/70 (BP Location: Left Arm, Patient Position: Sitting)  Comment: Made the nurse Louis aware of the elevated BP  Pulse 65   Temp 97.9 F (36.6 C) (Oral)   Resp 17   Ht _0  (1.575 m)   Wt 170 lb 8 oz (77.3 kg)   SpO2 98%   BMI 31.18 kg/m  Weight up 3 lbs.  Looks brighter today even with respiratory symptoms and pedal edema. Alert, oriented and appropriate. Ambulatory with rolling walker, stands straight. Used WC for hospital today. Partial alopecia  HEENT:PERRL, sclerae not icteric. Oral mucosa moist without lesions, posterior pharynx clear.  Neck supple. No JVD upright Lymphatics:no cervical,supraclavicular adenopathy Resp: DIminished BS bases bilaterally, no wheezes, no rales, minimal scattered crackles. Dullness just in bases. No use of accessory muscles. Does not appear dyspneic with minimal activity in exam room. Cardio: regular rate and rhythm. No gallop. GI: abdomen soft, nontender, not distended, no appreciable mass or organomegaly. Normally active bowel sounds.  Musculoskeletal/ Extremities: LE 2+ pedal edema bilaterally, without cords, tenderness Neuro: no peripheral neuropathy. Otherwise nonfocal. PSYCH appropriate mood and affect Skin without rash, ecchymosis, petechiae   Lab Results:  Results for orders placed or performed in visit on 06/14/16  CBC with Differential  Result Value Ref Range   WBC 4.7 3.9 - 10.3 10e3/uL   NEUT# 3.2 1.5 - 6.5 10e3/uL   HGB 12.0 11.6 - 15.9 g/dL   HCT 36.6 34.8 - 46.6 %   Platelets 204 145 - 400 10e3/uL   MCV 94.7 79.5 - 101.0 fL   MCH 31.1 25.1 - 34.0 pg   MCHC 32.9 31.5 - 36.0 g/dL   RBC 3.87 3.70 - 5.45 10e6/uL   RDW 15.5 (H) 11.2 - 14.5 %   lymph# 0.4 (L) 0.9 - 3.3 10e3/uL   MONO# 0.7 0.1 - 0.9 10e3/uL   Eosinophils Absolute 0.3 0.0 - 0.5 10e3/uL   Basophils Absolute 0.0 0.0 - 0.1 10e3/uL   NEUT% 68.9 38.4 - 76.8 %   LYMPH% 9.5 (L) 14.0 - 49.7 %   MONO% 14.9 (H) 0.0 - 14.0 %   EOS% 5.7 0.0 - 7.0 %   BASO% 1.0 0.0 - 2.0 %  Comprehensive metabolic panel  Result Value Ref Range   Sodium 140 136 - 145 mEq/L   Potassium 4.0 3.5 - 5.1 mEq/L   Chloride 110 (H) 98 - 109 mEq/L   CO2 22 22 - 29 mEq/L   Glucose 97 70 - 140 mg/dl   BUN 18.9 7.0 - 26.0 mg/dL   Creatinine 1.2 (H) 0.6 - 1.1  mg/dL   Total Bilirubin 0.72 0.20 - 1.20 mg/dL   Alkaline Phosphatase 92 40 - 150 U/L   AST 23 5 - 34 U/L   ALT 15 0 - 55 U/L   Total Protein 6.2 (L) 6.4 - 8.3 g/dL   Albumin 2.9 (L) 3.5 - 5.0 g/dL   Calcium 10.4 8.4 - 10.4 mg/dL   Anion Gap 7 3 - 11 mEq/L   EGFR 45 (L) >90 ml/min/1.73 m2   Labs reviewed with patient at time of visit  Studies/Results:  Dg Chest 2 View  Result Date: 06/14/2016 CLINICAL DATA:  Shortness of breath.  Breast cancer . EXAM: CHEST  2 VIEW COMPARISON:  05/30/2016. FINDINGS: Cardiomegaly with pulmonary vascular prominence interstitial prominence with bilateral pleural effusions. Findings consistent congestive heart failure. Other etiologies of interstitial prominence including pneumonitis and interstitial tumor spread cannot be excluded . Bilateral subsegmental atelectasis. Surgical clips right axilla. Thoracolumbar spine fusion. Hardware intact. Stable thoracic spine compression  fractures. IMPRESSION: 1. Congestive heart failure with pulmonary interstitial edema and bilateral pleural effusions. Other etiologies of interstitial prominence including pneumonitis and interstitial tumor spread cannot be excluded . 2. Bilateral subsegmental atelectasis . Electronically Signed   By: Marcello Moores  Register   On: 06/14/2016 11:04   PACs images reviewed by MD  BIlateral LE venous dopplers today negative for DVT per technologist phone call directly to MD now   Medications: I have reviewed the patient's current medications. Lasix 40 mg po today then repeat 40 mg in AM 11-25 if still SOB or LE swelling. Note K good today at 4.0 and creatinine 1.2, so have not added potassium now.   DISCUSSION Patient seen prior to CXR and LE dopplers, then back by MD at office after that evaluation.    I have LMOM for A fib clinic, which is closed until 06-17-16, requesting follow up there or at cardiology office early next week. [place] not available other than on call physician  today.  As similar symptoms during recent hospitalization improved with IV lasix (dosed at 40 mg), will try this as above today and 11-25. If increased SOB or other concerns, she should be seen in ED over weekend, otherwise will appreciate follow up either in A fib clinic or with cardiologist next week. Patient will call this office on 11-27 AM to give [place] update on symptoms, and we will confirm cardiology appointments then. Elevate legs, decrease salt in diet, increase protein in diet as albumin lower.  Other than what appears to be some CHF, as she had in hospital,  pain in right hip is clearly better since recent radiation and she otherwise looks better overall today. Will try to resume taxol 11-30 if stable, then I will see her with taxol on 12-7. If creatinine remains <= 1.2, I would like to continue bisphosphonate for bone involvement and ongoing slightly elevated calcium. Delton See ordered for 06-27-16, RN to do teaching by phone and managed care notified.   Patient is aware that another MD will be taking over her care after first of year; we will set up appointment with Dr Lindi Adie when closer to that time. She did surprisingly well with this discussion, aunt very supportive as always.  Assessment/Plan:  1. progressive metastatic breast cancer lungs andextensive bone involvement by PET 05-02-16: treatment changed to weekly taxol beginning 05-09-16. Hope to resume taxol 11-30.Palliative radiation to right iliac area completed 06-06-16. Small bilateral pleural effusions may be cancer related. Prior to present taxol,.pathologic compression fracture T10 and retropulsion of bone impinging on spinal cord for which she had T6 - L1 posterolateral arthrodesis with T10 laminectomy and T9/ T11 partial laminectomy by Dr Christella Noa on 2-10-1. Previous radiation to right hip and right femur helpful.  Also prior to taxol, no significant improvement on Aromasin x 4 months thru 01-02-16 and progression onxeloda 12-2015  thru 04-29-16. Last zometa 04-01-16, held with increased creatinine recently. Delton See may be more appropriate with renal function, for both bone mets and hypercalcemia.  2. Apparent CHF with orthopnea, CXR findings, and pedal edema without DVT. Oral lasix today and if needed on 11-25. ED this weekend if worse. Other recommendations as above  Atrial fibrillation with RVR: hospitalized 11-1 thru 05-31-16, cardioversion 05-27-16, on Eliquis, lopressor, amiodarone. Follow up in a fib clinic and by Dr Sallyanne Kuster. Lasix and xopenex nebs helpful in hospital. Will request cardiology follow up next week. 3.. Mild hypercalcemia: no IV bisphosphonate since 04-01-16 due to more elevated creatinine. Corrected calcium mildly  elevated again today.  4..needs flu vaccine, tho has refused thus far 5.diarrhea Jan treated for C diff ( Ab + toxin -) and resolved. 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  8.Advance Directives in place .9. Deconditioning multifactorial. 10. CKD III with EGFR 45 today. Note had increase in creatinine with IV diuresis in hospital  All questions answered. Coordination with cardiology appointments and collaborative RN as above. Chemo orders confirmed for 06-20-16. Xgeva orders placed, message to managed care to preauth. Cc Dr Sallyanne Kuster, Dr Philip Aspen, Dr Christella Noa, Dr Sondra Come TIme spent 40 min including >50% counseling and coordination of care.    Evlyn Clines, MD   06/14/2016, 1:54 PM

## 2016-06-14 NOTE — Progress Notes (Signed)
Lisa [name], She needs an appointment next week please. Maybe an APP when I am in clinic? MCr

## 2016-06-17 ENCOUNTER — Encounter (HOSPITAL_COMMUNITY): Payer: Self-pay | Admitting: Nurse Practitioner

## 2016-06-17 ENCOUNTER — Telehealth: Payer: Self-pay

## 2016-06-17 ENCOUNTER — Ambulatory Visit (HOSPITAL_COMMUNITY)
Admission: RE | Admit: 2016-06-17 | Discharge: 2016-06-17 | Disposition: A | Discharge status: Home / Self Care | Payer: Medicare Other | Source: Ambulatory Visit | Attending: Nurse Practitioner | Admitting: Nurse Practitioner

## 2016-06-17 VITALS — BP 142/70 | HR 61 | Ht 62.0 in | Wt 174.0 lb

## 2016-06-17 DIAGNOSIS — Z87891 Personal history of nicotine dependence: Secondary | ICD-10-CM | POA: Insufficient documentation

## 2016-06-17 DIAGNOSIS — Z7901 Long term (current) use of anticoagulants: Secondary | ICD-10-CM | POA: Diagnosis not present

## 2016-06-17 DIAGNOSIS — Z853 Personal history of malignant neoplasm of breast: Secondary | ICD-10-CM | POA: Diagnosis not present

## 2016-06-17 DIAGNOSIS — N183 Chronic kidney disease, stage 3 (moderate): Secondary | ICD-10-CM | POA: Diagnosis not present

## 2016-06-17 DIAGNOSIS — I129 Hypertensive chronic kidney disease with stage 1 through stage 4 chronic kidney disease, or unspecified chronic kidney disease: Secondary | ICD-10-CM | POA: Diagnosis not present

## 2016-06-17 DIAGNOSIS — C7951 Secondary malignant neoplasm of bone: Secondary | ICD-10-CM | POA: Diagnosis not present

## 2016-06-17 DIAGNOSIS — C799 Secondary malignant neoplasm of unspecified site: Secondary | ICD-10-CM | POA: Diagnosis not present

## 2016-06-17 DIAGNOSIS — I2510 Atherosclerotic heart disease of native coronary artery without angina pectoris: Secondary | ICD-10-CM | POA: Insufficient documentation

## 2016-06-17 DIAGNOSIS — I481 Persistent atrial fibrillation: Secondary | ICD-10-CM | POA: Insufficient documentation

## 2016-06-17 DIAGNOSIS — E785 Hyperlipidemia, unspecified: Secondary | ICD-10-CM | POA: Insufficient documentation

## 2016-06-17 DIAGNOSIS — I5032 Chronic diastolic (congestive) heart failure: Secondary | ICD-10-CM | POA: Diagnosis not present

## 2016-06-17 DIAGNOSIS — I480 Paroxysmal atrial fibrillation: Secondary | ICD-10-CM

## 2016-06-17 DIAGNOSIS — I509 Heart failure, unspecified: Secondary | ICD-10-CM

## 2016-06-17 MED ORDER — FUROSEMIDE 20 MG PO TABS: 20.0000 mg | ORAL_TABLET | Freq: Every day | ORAL | 6 refills | Status: DC

## 2016-06-17 MED FILL — FUROSEMIDE 20 MG TABLET: 20 | 30 days supply | Qty: 30 | Fill #0

## 2016-06-17 NOTE — Patient Instructions (Signed)
Your physician has recommended you make the following change in your medication:  1)Lasix 20mg  once a day

## 2016-06-17 NOTE — Telephone Encounter (Signed)
-----   Message from Gordy Levan, MD sent at 06/14/2016  1:39 PM EST ----- Regarding: follow up by phone 11-27 Apparent CHF at visit 11-24.   GIven lasix 40 mg po x 1 today and told to repeat on 11-25 if still SOB or LE swelling. She is to call this office Mon 11-27 AM to let us know how she is, and to follow up with additional appointments below:  I have LM now for A fib clinic ([phone]), that clinic closed until 11-27. [place] cardiology also closed today (I did not call their on call MD). Patient hopefully can be seen at A fib clinic or at [facility] cardiology office early next week.  (PCP may also be able to help if needed)   thanks

## 2016-06-17 NOTE — Progress Notes (Signed)
Primary Care Physician: [name], MD Referring Physician: [facility] f/u Cardiologist: to establish with Dr. Jeannetta Nap is a 75 y.o. 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 difficile colitis, CKD-2, who presented with irregular heartbeat at time of a radiation treatment. She was admitted and found to be in acute diastolic heart failure due to afib with rvr. Diuresed, IV Cardizem started and had successful cardioversion. Discharged on amiodarone 200 mg bid and eliquis 5 mg bid for CHA2DS2VASc of 3. She has not had any recurrent afib. No bleeding issues.    She was evaluated in the afib clinic for f/u hospital and was in [place] on amiodarone 200 mg bid. She was to continue that dose x 2 weeks then reduce to one a day on 11/24.  She was seen in the cancer unit Friday and was found to have c/o of fluid weight gain, shortness of breath with wheezing and orthopnea. Weight was up 3 lbs and she was given 40 mg lasix x 3 days and asked to f/u here today 11/27. She is still pending appointment to get established with Dr. Loletha Grayer 07/29/16 and will try to move up this appointment. She has been maintaining SR so I am perplexed from a cardiac standpoint why she would have fluid retention. She is avoiding salt and elevates legs when sitting. Her weight is down 4 lbs, but she still feels that she is not back to her dry weight. Breathing  is improved, no further nocturnal wheezing or cough.She did not have chemo last week. CXR shows CHF but unchanged from previous exam.Labs were drawn including a cmet, with creatinine at 1.2 and normal liver enzymes, CBC wnl, TSH wnl  3 weeks ago.   Today, she denies symptoms of palpitations, chest pain,  shortness of breath, orthopnea, PND, lower extremity edema, dizziness, presyncope, syncope, or neurologic sequela. The patient is tolerating medications without difficulties and is otherwise without complaint today.   Past Medical History:  Diagnosis Date  . Breast cancer ([place]) 07-13-1997   right  . Chronic back pain   . CKD (chronic kidney disease), stage III   . Complication of anesthesia   . Coronary artery calcification of native artery    a. seen on PET scan 04/2016.  [name] Hypertension   . Pericardial effusion    a. small by TEE 05/2016.  [name] Persistent atrial fibrillation ([place])   . Pleural effusion 11-08-2008  . PONV (postoperative nausea and vomiting)   . Radiation 08/17/2015-08/29/2015   lower thoracic spine 22.5 gray  . Radiation 01/17/16-01/24/16   right femur 20Gy   Past Surgical History:  Procedure Laterality Date  . ABDOMINAL HYSTERECTOMY  02-21-1982  . BREAST BIOPSY  07-13-1997  . CARDIOVERSION N/A 05/27/2016   Procedure: CARDIOVERSION;  Surgeon: [name], MD;  Location: [facility];  Service: Cardiovascular;  Laterality: N/A;  . DILATION AND CURETTAGE, DIAGNOSTIC / THERAPEUTIC  12-13-1981  . MASTECTOMY  07-29-1997  . PELVIC LAPAROSCOPY  11-06-1981  . POSTERIOR LUMBAR FUSION 4 LEVEL N/A 09/01/2015   Procedure: Thoracic six-Lumbar one fusion with arthrodesis pedicle screw stabilization and decompression;  Surgeon: Ashok Pall, MD;  Location: [facility];  Service: Neurosurgery;  Laterality: N/A;  T6-L1 fusion with arthrodesis pedicle screw stabilization and decompression  .  TEE WITHOUT CARDIOVERSION N/A 05/27/2016   Procedure: TRANSESOPHAGEAL ECHOCARDIOGRAM (TEE);  Surgeon: [name], MD;  Location: [facility];  Service: Cardiovascular;  Laterality: N/A;  . THORACENTESIS  10-18-2008    Current Outpatient Prescriptions  Medication Sig Dispense Refill  . acetaminophen (TYLENOL) 325 MG tablet Take 325 mg by mouth every 6 (six) hours as needed for mild pain. Reported on  01/10/2016    . amiodarone (PACERONE) 200 MG tablet Take 100 mg by mouth 2 (two) times daily.    [name] apixaban (ELIQUIS) 5 MG TABS tablet Take 1 tablet (5 mg total) by mouth 2 (two) times daily. 60 tablet 0  . dexamethasone (DECADRON) 4 MG tablet Take 5 tabs (20 mg) 12 hours and 6 hours before taxol for first chemo. Take 5 tabs (20mg ) 12 hours before taxol for subsequent treatment. 20 tablet 2  . fluticasone (FLONASE) 50 MCG/ACT nasal spray Place 1 spray into both nostrils 2 (two) times daily. 16 g 2  . furosemide (LASIX) 20 MG tablet Take 1 tablet (20 mg total) by mouth daily. 30 tablet 6  . loratadine (CLARITIN) 10 MG tablet Take 10 mg by mouth daily.     . metoprolol tartrate (LOPRESSOR) 25 MG tablet Take 1 tablet (25 mg total) by mouth 2 (two) times daily. 60 tablet 0  . ondansetron (ZOFRAN) 8 MG tablet Take 1 tablet (8 mg total) by mouth every 8 (eight) hours as needed for nausea or vomiting. 20 tablet 2  . potassium chloride (K-DUR) 10 MEQ tablet Take 1 tablet (10 mEq total) by mouth daily. Or as directed. 30 tablet 0  . simvastatin (ZOCOR) 20 MG tablet Take 20 mg by mouth at bedtime.      . traMADol (ULTRAM) 50 MG tablet Take 50-100 mg by mouth every 8 (eight) hours as needed for moderate pain.    . vitamin B-12 (CYANOCOBALAMIN) 1000 MCG tablet Take 1,000 mcg by mouth 2 (two) times daily.    . vitamin C (ASCORBIC ACID) 500 MG tablet Take 500 mg by mouth daily.      . vitamin E 200 UNIT capsule Take 200 Units by mouth daily.       No current facility-administered medications for this encounter.     Allergies  Allergen Reactions  . Diphenhydramine Hcl Other (See Comments)    Severe headaches  . Codeine Sulfate Rash    REACTION: rash. Makes her jittery  . Oseltamivir Phosphate Nausea And Vomiting    REACTION: rash  . Oxycodone-Acetaminophen Nausea And Vomiting    REACTION: emesis  . Prednisone Other (See Comments)    REACTION: hiatal hernia, runny  Nose, felt terrible  . Sudafed  [Pseudoephedrine Hcl] Other (See Comments)    vertigo    Social History   Social History  . Marital status: Single    Spouse name: N/A  . Number of children: 0  . Years of education: N/A   Occupational History  . Administraton for [employer]    Social History Main Topics  . Smoking status: Former Smoker    Packs/day: 0.50    Years: 30.00    Types: Cigarettes    Quit date: 07/22/1996  . Smokeless tobacco: Never Used     Comment: 1/2 up to 1 ppd  . Alcohol use No     Comment: hx of alcohol socially - hasn't had drink since March 1998  . Drug use: No  . Sexual activity: Not on file   Other Topics Concern  .  Not on file   Social History Narrative  . No narrative on file    Family History  Problem Relation Age of Onset  . Heart disease Mother     with pacemaker   . Asthma Father   . Heart attack Maternal Grandfather 64  . Diabetes Paternal Grandmother   . Aortic stenosis Maternal Uncle   . Prostate cancer Maternal Uncle     dx unspecified age  . Prostate cancer Maternal Uncle     dx older than 50y  . Prostate cancer Maternal Uncle     dx older than 50y  . Prostate cancer Maternal Uncle 72    s/p prostatectomy  . Breast cancer Cousin 29    maternal 1st cousin; s/p lumpectomy and radiation  . Breast cancer Cousin     maternal 1st cousin dx mid-late 30s; s/p mastectomy    ROS- All systems are reviewed and negative except as per the HPI above  Physical Exam: Vitals:   06/17/16 1502  BP: (!) 142/70  Pulse: 61  Weight: 174 lb (78.9 kg)  Height: 5\' 2"  (1.575 m)    GEN- The patient is well appearing, alert and oriented x 3 today.   Head- normocephalic, atraumatic Eyes-  Sclera clear, conjunctiva pink Ears- hearing intact Oropharynx- clear Neck- supple, no JVP Lymph- no cervical lymphadenopathy Lungs- Clear to ausculation bilaterally, normal work of breathing Heart- Regular rate and rhythm, no murmurs, rubs or gallops, PMI not laterally  displaced GI- soft, NT, ND, + BS Extremities- no clubbing, cyanosis, or trace edmea MS- no significant deformity or atrophy Skin- no rash or lesion Psych- euthymic mood, full affect Neuro- strength and sensation are intact  EKG-NSR at 61 bpm,  pr int 176 ms, qrs int 86 ms, qtc 473 ms Echo-Left ventricle: The cavity size was normal. Wall thickness was   normal. Systolic function was normal. The estimated ejection   fraction was in the range of 55% to 60%. Wall motion was normal;   there were no regional wall motion abnormalities. - Aortic valve: There was trivial regurgitation. - Mitral valve: Calcified annulus. Systolic bowing without   prolapse. There was moderate regurgitation directed centrally. - Left atrium: The atrium was mildly dilated. - Right atrium: The atrium was mildly dilated. - Atrial septum: No defect or patent foramen ovale was identified. - Pulmonary arteries: Systolic pressure was mildly to moderately   increased. PA peak pressure: 49 mm Hg (S).  Assessment and Plan: 1.New onset afib with rvr Successful cardioversion Staying ins SR Continue amiodarone 200 mg a day Continue eliquis 5 mg bid  2. Breast cancer metastasized to multiple sites Per oncologist Dr. Marko Plume and radiation oncologist Dr. Sondra Come  3. CHF/diastolic dysfunction For now will continue 20 mg lasix a day Pt will weight daily and record weights Avoid salt Report 3-5 lb weight gain Elevate feet when sitting and knee high support socks suggested  Will ask for appointment with Dr. Loletha Grayer, scheduled for 1/8, can be moved up next 1-2 weeks afib clinic as needed   Butch Penny C. Janson Lamar, [facility] [address] [place],  [address] [phone]

## 2016-06-17 NOTE — Telephone Encounter (Signed)
Lisa [name] stated that she took  40 mg of lasix daily x 3 as directed by Dr. Marko Plume. The swelling in her feet is 75% better.  The right foot currently is more swollen then the left. She has kept legs elevated all weekend. The wheezing stopped 2 nights ago.  She is breathing better.   Dr. Marko Plume said to follow up with the A-Fib clinic at 3 pm today for further evaluation. Lisa [name] verbalized understanding.

## 2016-06-19 ENCOUNTER — Telehealth: Payer: Self-pay

## 2016-06-19 NOTE — Telephone Encounter (Signed)
Reviewed the side effects of Xgeva including but not limited to Osteonecrosis of the jaw, better for her kidney function vs zometa, and washed out feeling ~2 days after injection. Told Lisa [name] that a handout on Delton See can be given to her at her next visit. Lisa [name] is in agreement with the Delton See if it is covered by insurance.

## 2016-06-19 NOTE — Telephone Encounter (Signed)
-----   Message from Gordy Levan, MD sent at 06/14/2016  2:50 PM EST ----- I have asked managed care to get prior auth for xgeva, as it may be better for kidney function than zometa/ pamidronate, for bone mets and hypercalcemia.  Please do teaching for xgeva, by phone ok. Fine to do this when we follow up other issues next week  thanks

## 2016-06-20 ENCOUNTER — Other Ambulatory Visit (HOSPITAL_BASED_OUTPATIENT_CLINIC_OR_DEPARTMENT_OTHER): Payer: Medicare Other

## 2016-06-20 ENCOUNTER — Ambulatory Visit (HOSPITAL_BASED_OUTPATIENT_CLINIC_OR_DEPARTMENT_OTHER): Payer: Medicare Other

## 2016-06-20 VITALS — BP 144/68 | HR 70 | Temp 98.0°F | Resp 16

## 2016-06-20 DIAGNOSIS — C50911 Malignant neoplasm of unspecified site of right female breast: Secondary | ICD-10-CM

## 2016-06-20 DIAGNOSIS — Z5111 Encounter for antineoplastic chemotherapy: Secondary | ICD-10-CM | POA: Diagnosis not present

## 2016-06-20 LAB — CBC WITH DIFFERENTIAL/PLATELET
BASO%: 0 % (ref 0.0–2.0)
BASOS ABS: 0 10*3/uL (ref 0.0–0.1)
EOS%: 0 % (ref 0.0–7.0)
Eosinophils Absolute: 0 10*3/uL (ref 0.0–0.5)
HEMATOCRIT: 36.7 % (ref 34.8–46.6)
HGB: 12.5 g/dL (ref 11.6–15.9)
LYMPH#: 0.5 10*3/uL — AB (ref 0.9–3.3)
LYMPH%: 11.8 % — AB (ref 14.0–49.7)
MCH: 31.1 pg (ref 25.1–34.0)
MCHC: 34.1 g/dL (ref 31.5–36.0)
MCV: 91.3 fL (ref 79.5–101.0)
MONO#: 0.1 10*3/uL (ref 0.1–0.9)
MONO%: 2.5 % (ref 0.0–14.0)
NEUT#: 3.8 10*3/uL (ref 1.5–6.5)
NEUT%: 85.7 % — AB (ref 38.4–76.8)
Platelets: 192 10*3/uL (ref 145–400)
RBC: 4.02 10*6/uL (ref 3.70–5.45)
RDW: 14.5 % (ref 11.2–14.5)
WBC: 4.4 10*3/uL (ref 3.9–10.3)

## 2016-06-20 LAB — COMPREHENSIVE METABOLIC PANEL
ALT: 20 U/L (ref 0–55)
AST: 29 U/L (ref 5–34)
Albumin: 3.4 g/dL — ABNORMAL LOW (ref 3.5–5.0)
Alkaline Phosphatase: 100 U/L (ref 40–150)
Anion Gap: 10 mEq/L (ref 3–11)
BUN: 20.2 mg/dL (ref 7.0–26.0)
CALCIUM: 10.9 mg/dL — AB (ref 8.4–10.4)
CHLORIDE: 111 meq/L — AB (ref 98–109)
CO2: 19 mEq/L — ABNORMAL LOW (ref 22–29)
Creatinine: 1.1 mg/dL (ref 0.6–1.1)
EGFR: 50 mL/min/{1.73_m2} — ABNORMAL LOW (ref >90)
Glucose: 144 mg/dl — ABNORMAL HIGH (ref 70–140)
POTASSIUM: 4.1 meq/L (ref 3.5–5.1)
Sodium: 140 mEq/L (ref 136–145)
Total Bilirubin: 0.82 mg/dL (ref 0.20–1.20)
Total Protein: 7 g/dL (ref 6.4–8.3)

## 2016-06-20 MED ORDER — DEXAMETHASONE SODIUM PHOSPHATE 10 MG/ML IJ SOLN
10.0000 mg | Freq: Once | INTRAMUSCULAR | Status: AC
  Administered 2016-06-20: 10 mg via INTRAVENOUS

## 2016-06-20 MED ORDER — SODIUM CHLORIDE 0.9 % IV SOLN
80.0000 mg/m2 | Freq: Once | INTRAVENOUS | Status: AC
  Administered 2016-06-20: 150 mg via INTRAVENOUS
  Filled 2016-06-20: qty 25

## 2016-06-20 MED ORDER — ONDANSETRON HCL 8 MG PO TABS
8.0000 mg | ORAL_TABLET | Freq: Once | ORAL | Status: AC
  Administered 2016-06-20: 8 mg via ORAL

## 2016-06-20 MED ORDER — FAMOTIDINE IN NACL 20-0.9 MG/50ML-% IV SOLN
20.0000 mg | Freq: Once | INTRAVENOUS | Status: AC
  Administered 2016-06-20: 20 mg via INTRAVENOUS

## 2016-06-20 MED ORDER — ONDANSETRON HCL 8 MG PO TABS
ORAL_TABLET | ORAL | Status: AC
  Filled 2016-06-20: qty 1

## 2016-06-20 MED ORDER — LORATADINE 10 MG PO TABS
10.0000 mg | ORAL_TABLET | Freq: Every day | ORAL | Status: DC
  Filled 2016-06-20: qty 1

## 2016-06-20 MED ORDER — SODIUM CHLORIDE 0.9 % IV SOLN
Freq: Once | INTRAVENOUS | Status: AC
  Administered 2016-06-20: 15:00:00 via INTRAVENOUS

## 2016-06-20 MED ORDER — FAMOTIDINE IN NACL 20-0.9 MG/50ML-% IV SOLN
INTRAVENOUS | Status: AC
  Filled 2016-06-20: qty 50

## 2016-06-20 MED ORDER — DEXAMETHASONE SODIUM PHOSPHATE 10 MG/ML IJ SOLN
INTRAMUSCULAR | Status: AC
  Filled 2016-06-20: qty 1

## 2016-06-20 NOTE — Patient Instructions (Signed)
[facility] Discharge Instructions for Patients Receiving Chemotherapy  Today you received the following chemotherapy agents Taxol  To help prevent nausea and vomiting after your treatment, we encourage you to take your nausea medication   If you develop nausea and vomiting that is not controlled by your nausea medication, call the clinic.   BELOW ARE SYMPTOMS THAT SHOULD BE REPORTED IMMEDIATELY:  *FEVER GREATER THAN 100.5 F  *CHILLS WITH OR WITHOUT FEVER  NAUSEA AND VOMITING THAT IS NOT CONTROLLED WITH YOUR NAUSEA MEDICATION  *UNUSUAL SHORTNESS OF BREATH  *UNUSUAL BRUISING OR BLEEDING  TENDERNESS IN MOUTH AND THROAT WITH OR WITHOUT PRESENCE OF ULCERS  *URINARY PROBLEMS  *BOWEL PROBLEMS  UNUSUAL RASH Items with * indicate a potential emergency and should be followed up as soon as possible.  Feel free to call the clinic you have any questions or concerns. The clinic phone number is [phone].  Please show the CHEMO ALERT CARD at check-in to the Emergency Department and triage nurse.   

## 2016-06-23 ENCOUNTER — Other Ambulatory Visit: Payer: Self-pay | Admitting: Oncology

## 2016-06-23 DIAGNOSIS — C7951 Secondary malignant neoplasm of bone: Principal | ICD-10-CM

## 2016-06-23 DIAGNOSIS — C50911 Malignant neoplasm of unspecified site of right female breast: Secondary | ICD-10-CM

## 2016-06-24 NOTE — Progress Notes (Signed)
  Radiation Oncology         [phone]) [phone] ________________________________  Name: Lisa [name] MRN: [id number]  Date: 06/06/2016  DOB: [birth year]  End of Treatment Note  Diagnosis:   Metastatic breast cancer with osseous metastasis  Indication for treatment:  Palliative     Radiation treatment dates:   05/21/16 - 06/06/16  Site/dose:   28 Gy in 8 fractions to the right ilium.  Beams/energy:   3D // 15X, 6X Photon  Narrative: The patient tolerated radiation treatment relatively well. The patient did not report right hip pain during her last weekly under treatment check. However, during her under treatment check on 05/21/16, her peripheral pulses were strong, but somewhat irregular. She denied a history of atrial fibrillation. The following day, she had an irregular rhythm and elevated rate and she was transferred to the ED for further evaluation. The patient was diagnosed with atrial fibrillation with rapid ventricular response. The patient did not have treatment from 05/23/16 to 05/27/16.  Plan: The patient has completed radiation treatment. The patient will return to radiation oncology clinic for routine followup in one month. I advised them to call or return sooner if they have any questions or concerns related to their recovery or treatment.  -----------------------------------  Blair Promise, PhD, MD  This document serves as a record of services personally performed by Gery Pray, MD. It was created on his behalf by Darcus Austin, a trained medical scribe. The creation of this record is based on the scribe's personal observations and the provider's statements to them. This document has been checked and approved by the attending provider.

## 2016-06-26 ENCOUNTER — Other Ambulatory Visit: Payer: Self-pay

## 2016-06-26 MED ORDER — APIXABAN 5 MG PO TABS: 5.0000 mg | ORAL_TABLET | Freq: Two times a day (BID) | ORAL | 0 refills | Status: DC

## 2016-06-26 MED ORDER — METOPROLOL TARTRATE 25 MG PO TABS
25.0000 mg | ORAL_TABLET | Freq: Two times a day (BID) | ORAL | 0 refills | Status: DC
Start: 2016-06-26 — End: 2016-07-29

## 2016-06-26 NOTE — Telephone Encounter (Signed)
Spoke with [facility] and stated that Dr. Sallyanne Kuster neeeds to be sent refill requests for [name] eliquis and metoprolol.

## 2016-06-26 NOTE — Telephone Encounter (Signed)
Rx(s) sent to pharmacy electronically.  

## 2016-06-27 ENCOUNTER — Encounter: Payer: Self-pay | Admitting: Oncology

## 2016-06-27 ENCOUNTER — Other Ambulatory Visit (HOSPITAL_BASED_OUTPATIENT_CLINIC_OR_DEPARTMENT_OTHER): Payer: Medicare Other

## 2016-06-27 ENCOUNTER — Ambulatory Visit (HOSPITAL_BASED_OUTPATIENT_CLINIC_OR_DEPARTMENT_OTHER): Payer: Medicare Other | Admitting: Oncology

## 2016-06-27 ENCOUNTER — Telehealth: Payer: Self-pay

## 2016-06-27 ENCOUNTER — Ambulatory Visit: Payer: Medicare Other

## 2016-06-27 ENCOUNTER — Ambulatory Visit (HOSPITAL_BASED_OUTPATIENT_CLINIC_OR_DEPARTMENT_OTHER): Payer: Medicare Other

## 2016-06-27 VITALS — BP 146/69 | HR 71 | Temp 97.7°F | Resp 17 | Wt 166.8 lb

## 2016-06-27 DIAGNOSIS — C7951 Secondary malignant neoplasm of bone: Secondary | ICD-10-CM

## 2016-06-27 DIAGNOSIS — Z79899 Other long term (current) drug therapy: Secondary | ICD-10-CM

## 2016-06-27 DIAGNOSIS — C50911 Malignant neoplasm of unspecified site of right female breast: Secondary | ICD-10-CM

## 2016-06-27 DIAGNOSIS — N183 Chronic kidney disease, stage 3 (moderate): Secondary | ICD-10-CM

## 2016-06-27 DIAGNOSIS — C7800 Secondary malignant neoplasm of unspecified lung: Secondary | ICD-10-CM

## 2016-06-27 DIAGNOSIS — Z5111 Encounter for antineoplastic chemotherapy: Secondary | ICD-10-CM | POA: Diagnosis not present

## 2016-06-27 DIAGNOSIS — C50919 Malignant neoplasm of unspecified site of unspecified female breast: Secondary | ICD-10-CM

## 2016-06-27 DIAGNOSIS — D701 Agranulocytosis secondary to cancer chemotherapy: Secondary | ICD-10-CM

## 2016-06-27 DIAGNOSIS — T451X5A Adverse effect of antineoplastic and immunosuppressive drugs, initial encounter: Secondary | ICD-10-CM

## 2016-06-27 DIAGNOSIS — Z7901 Long term (current) use of anticoagulants: Secondary | ICD-10-CM

## 2016-06-27 LAB — COMPREHENSIVE METABOLIC PANEL
ALBUMIN: 3.4 g/dL — AB (ref 3.5–5.0)
ALK PHOS: 114 U/L (ref 40–150)
ALT: 22 U/L (ref 0–55)
ANION GAP: 12 meq/L — AB (ref 3–11)
AST: 27 U/L (ref 5–34)
BUN: 20 mg/dL (ref 7.0–26.0)
CALCIUM: 10.2 mg/dL (ref 8.4–10.4)
CO2: 20 mEq/L — ABNORMAL LOW (ref 22–29)
Chloride: 109 mEq/L (ref 98–109)
Creatinine: 1.1 mg/dL (ref 0.6–1.1)
EGFR: 49 mL/min/{1.73_m2} — AB (ref >90)
Glucose: 190 mg/dl — ABNORMAL HIGH (ref 70–140)
POTASSIUM: 3.9 meq/L (ref 3.5–5.1)
Sodium: 141 mEq/L (ref 136–145)
Total Bilirubin: 0.94 mg/dL (ref 0.20–1.20)
Total Protein: 7.2 g/dL (ref 6.4–8.3)

## 2016-06-27 LAB — CBC WITH DIFFERENTIAL/PLATELET
BASO%: 0 % (ref 0.0–2.0)
BASOS ABS: 0 10*3/uL (ref 0.0–0.1)
EOS ABS: 0 10*3/uL (ref 0.0–0.5)
EOS%: 0 % (ref 0.0–7.0)
HEMATOCRIT: 37.6 % (ref 34.8–46.6)
HEMOGLOBIN: 13 g/dL (ref 11.6–15.9)
LYMPH#: 0.5 10*3/uL — AB (ref 0.9–3.3)
LYMPH%: 23.6 % (ref 14.0–49.7)
MCH: 31.2 pg (ref 25.1–34.0)
MCHC: 34.6 g/dL (ref 31.5–36.0)
MCV: 90.2 fL (ref 79.5–101.0)
MONO#: 0 10*3/uL — AB (ref 0.1–0.9)
MONO%: 1 % (ref 0.0–14.0)
NEUT#: 1.4 10*3/uL — ABNORMAL LOW (ref 1.5–6.5)
NEUT%: 75.4 % (ref 38.4–76.8)
PLATELETS: 234 10*3/uL (ref 145–400)
RBC: 4.17 10*6/uL (ref 3.70–5.45)
RDW: 14.1 % (ref 11.2–14.5)
WBC: 1.9 10*3/uL — ABNORMAL LOW (ref 3.9–10.3)
nRBC: 0 % (ref 0–0)

## 2016-06-27 MED ORDER — FAMOTIDINE IN NACL 20-0.9 MG/50ML-% IV SOLN
20.0000 mg | Freq: Once | INTRAVENOUS | Status: AC
  Administered 2016-06-27: 20 mg via INTRAVENOUS

## 2016-06-27 MED ORDER — DEXAMETHASONE SODIUM PHOSPHATE 10 MG/ML IJ SOLN
10.0000 mg | Freq: Once | INTRAMUSCULAR | Status: AC
  Administered 2016-06-27: 10 mg via INTRAVENOUS

## 2016-06-27 MED ORDER — DEXAMETHASONE SODIUM PHOSPHATE 10 MG/ML IJ SOLN
INTRAMUSCULAR | Status: AC
  Filled 2016-06-27: qty 1

## 2016-06-27 MED ORDER — FAMOTIDINE IN NACL 20-0.9 MG/50ML-% IV SOLN
INTRAVENOUS | Status: AC
Start: 2016-06-27 — End: 2016-06-27
  Filled 2016-06-27: qty 50

## 2016-06-27 MED ORDER — LORATADINE 10 MG PO TABS
10.0000 mg | ORAL_TABLET | Freq: Every day | ORAL | Status: DC
  Filled 2016-06-27: qty 1

## 2016-06-27 MED ORDER — DENOSUMAB 120 MG/1.7ML ~~LOC~~ SOLN
120.0000 mg | Freq: Once | SUBCUTANEOUS | Status: AC
  Administered 2016-06-27: 120 mg via SUBCUTANEOUS
  Filled 2016-06-27: qty 1.7

## 2016-06-27 MED ORDER — ONDANSETRON HCL 8 MG PO TABS
8.0000 mg | ORAL_TABLET | Freq: Once | ORAL | Status: AC
  Administered 2016-06-27: 8 mg via ORAL

## 2016-06-27 MED ORDER — SODIUM CHLORIDE 0.9 % IV SOLN
Freq: Once | INTRAVENOUS | Status: AC
  Administered 2016-06-27: 15:00:00 via INTRAVENOUS

## 2016-06-27 MED ORDER — SODIUM CHLORIDE 0.9 % IV SOLN
80.0000 mg/m2 | Freq: Once | INTRAVENOUS | Status: AC
  Administered 2016-06-27: 150 mg via INTRAVENOUS
  Filled 2016-06-27: qty 25

## 2016-06-27 MED ORDER — ONDANSETRON HCL 8 MG PO TABS
ORAL_TABLET | ORAL | Status: AC
  Filled 2016-06-27: qty 1

## 2016-06-27 MED FILL — ELIQUIS 5 MG TABLET: 5 | 30 days supply | Qty: 60 | Fill #0

## 2016-06-27 MED FILL — METOPROLOL TARTRATE 25 MG T: 25 | 30 days supply | Qty: 60 | Fill #0

## 2016-06-27 NOTE — Telephone Encounter (Signed)
Received call from Dr.Livesay.She stated patient needs  PA for Eliquis.Spoke to [employer] [phone].Eliquis 5 mg approved.Ref # [id number].

## 2016-06-27 NOTE — Progress Notes (Signed)
OK to treat with ANC 1.4 per Dr Marko Plume

## 2016-06-27 NOTE — Progress Notes (Signed)
OFFICE PROGRESS NOTE   June 27, 2016   Physicians:J.Kinard, [name] (PCP), (R.Sharma), A.Kendall. K.Cabbell.,M.Croitoru  INTERVAL HISTORY:  Patient is seen, alone for visit, in continuing attention to metastatic breast cancer involving bone and lung, now being treated with weekly taxol and for first xgeva today (previously zometa).   Patient is feeling better now on lasix 20 mg daily by A fib clinic. She is less SOB, tho still props up on pillows to sleep, and pedal edema has nearly resolved. No palpitations or chest pain (did not have with A fib either). She denies any pain now, including right hip area, does notice that back is "tired" after she has been up for some time. Appetite is good, without nausea, and bowels are moving regularly; she eats out daily, likely much more sodium than she realizes. No cough, no sinus congestion. No bleeding. No bladder problems. Neuropathy in toes stable, none in hands. No fever or symptoms of infection. 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 CA 2729 was 434 in April 2010. She has tolerated Femara well and clinically has continued to do very well, though CA 27.29 has never normalized. 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 Patient seen by medical oncology 08-10-15 and radiation begun shortly afterwards by Dr Sondra Come, 22.5 of planned 35 Gy radiation to area of T10  thru 08-29-15. She had first zometa 08-11-15. MRI T spine obtained after some difficulty on 08-29-15, with pathologic compression fracture T10 with retropulsion of compressed vertebra to right with compression of cord called, involvement also T9, T11, L2. She had thoracic six- lumbar one posterolateral arthrodesis, T10 laminectomy, partial laminectomy T9, T11 by Dr Christella Noa on 09-01-15. She was begun on Aromasin on 09-07-15.  PET had been scheduled for 09-04-15, delayed with surgery, accomplished on 09-20-15 with chest adenopathy, increased soft tissue right lung base, extensive bone involvement.  Repeat PET 12-22-15 some improvement in area of radiation T spine, otherwise no improvement in bones, some increase right femur, some central chest adenopathy and right lower chest/ pleural involvement. Aromasin was discontinued and xeloda begun 01-05-16, used thru 04-29-16.  BRCA negative by [facility] 28 gene panel by [facility] 03-2016. Progressed in lung and bones by PET 05-02-16. Weekly taxol begun 05-09-16, tho interrupted due to new onset A fib early 05-2016. Radiation to right pelvis / hip ~ 05-21-16 thru 06-06-16, 28 Gy   Objective:  Vital signs in last 24 hours:  BP (!) 146/69 (BP Location: Left Arm, Patient Position: Sitting)   Pulse 71   Temp 97.7 F (36.5 C) (Oral)   Resp 17   Wt 166 lb 12.8 oz (75.7 kg)   SpO2 99%   BMI 30.51 kg/m  Weight down 3.5 lbs since adding lasix Alert, oriented and appropriate. Ambulatory with rolling walker, moving more easily. Talkative and in good spirits, did take premed decadron for taxol.   Alopecia  HEENT:PERRL, sclerae not icteric. Oral mucosa moist without lesions, posterior pharynx clear.  Neck supple. No JVD.  Lymphatics:no cervical,supraclavicular adenopathy Resp: decreased breath sounds right base unchanged, otherwise no rales, wheezes, crackles.  Cardio: regular rate and rhythm. No gallop. GI: soft, nontender, not distended, no mass or organomegaly. Normally  active bowel sounds.  Musculoskeletal/ Extremities: trace pedal edema R > L without cords, tenderness. No swelling UE. Surgical scar back well healed. Neuro: no increase in peripheral neuropathy. Otherwise nonfocal. PSYCH appropriate mood and affect. Skin without rash, ecchymosis, petechiae   Lab Results:  Results for orders placed or performed in visit on 06/27/16  CBC with Differential  Result Value Ref Range   WBC 1.9 (L) 3.9 - 10.3 10e3/uL   NEUT# 1.4 (L) 1.5 - 6.5 10e3/uL   HGB 13.0 11.6 - 15.9 g/dL   HCT 37.6 34.8 - 46.6 %   Platelets 234 145 - 400 10e3/uL   MCV 90.2 79.5 - 101.0 fL   MCH 31.2 25.1 - 34.0 pg   MCHC 34.6 31.5 - 36.0 g/dL   RBC 4.17 3.70 - 5.45 10e6/uL   RDW 14.1 11.2 - 14.5 %   lymph# 0.5 (L) 0.9 - 3.3 10e3/uL   MONO# 0.0 (L) 0.1 - 0.9 10e3/uL   Eosinophils Absolute 0.0 0.0 - 0.5 10e3/uL   Basophils Absolute 0.0 0.0 - 0.1 10e3/uL   NEUT% 75.4 38.4 - 76.8 %   LYMPH% 23.6 14.0 - 49.7 %   MONO% 1.0 0.0 - 14.0 %   EOS% 0.0 0.0 - 7.0 %   BASO% 0.0 0.0 - 2.0 %   nRBC 0 0 - 0 %  Comprehensive metabolic panel  Result Value Ref Range   Sodium 141 136 - 145 mEq/L   Potassium 3.9 3.5 - 5.1 mEq/L   Chloride 109 98 - 109 mEq/L   CO2 20 (L) 22 - 29 mEq/L   Glucose 190 (H) 70 - 140 mg/dl   BUN 20.0 7.0 - 26.0 mg/dL   Creatinine 1.1 0.6 - 1.1 mg/dL   Total Bilirubin 0.94 0.20 - 1.20 mg/dL   Alkaline Phosphatase 114 40 - 150 U/L   AST 27 5 - 34 U/L   ALT 22 0 - 55 U/L   Total Protein 7.2 6.4 - 8.3 g/dL   Albumin 3.4 (L) 3.5 - 5.0 g/dL   Calcium 10.2 8.4 - 10.4 mg/dL   Anion Gap 12 (H) 3 - 11 mEq/L   EGFR 49 (L) >90 ml/min/1.73 m2     Studies/Results:  No results found.  Medications: I have reviewed the patient's current medications. Will add granix after each taxol for now. I have spoken directly with [facility], as patient needs refill on Eliquis, which needs preauth by cardiology (per [place]). Dr Croitoru's staff doing preauth now  for prescription to be picked up today.  Did not address flu vaccine again today, will follow up. Patient refused in past, but seemed to be more open to it when we discussed a few weeks ago.  DISCUSSION Patient in agreement with Xgeva instead of zometa, which will not need dose change for renal function.  Will treat with taxol today despite ANC 1.4, and add granix 300 day after chemo. Due to  A fib and radiation, taxol today will be only #4 since Oct 19, but, other than ANC slightly low, she looks much better overall today, so will continue with addition of granix.  Assessment/Plan:  1. progressive metastatic breast cancer to lungs andextensive bone involvement by PET 05-02-16: treatment changed to weekly taxol beginning 05-09-16,  interruptions with other problems, #4 taxol today.Leukopenia due to chemo, recent pelvic RT and extent of bone involvement. Add granix. Changing zometa to xgeva, first today. Palliative radiation to right iliac area completed 06-06-16.  Prior to present taxol,.pathologic compression fracture T10 and retropulsion of bone impinging on spinal cord for which she had T6 - L1 laminectomy by Dr Christella Noa 09-01-15.  2. Apparent CHF with orthopnea, CXR findings, and pedal edema without DVT. Clinically improved now on lasix 20 mg daily by cardiology, tho not clear why she has the CHF.  Atrial fibrillation with RVR: hospitalized 11-1 thru 05-31-16, cardioversion 05-27-16, on Eliquis, lopressor, amiodarone. Due Eliquis refill, cardiology made aware of preauth for this.  3.. Mild hypercalcemia intermittently, tho in normal range today. Xgeva as noted, follow.  4..needs flu vaccine, tho has refused thus far 5.diarrhea Jan treated for C diff ( Ab + toxin -) and resolved. 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  8.Advance Directives in place .9. Deconditioning multifactorial. 10. CKD III. Creatinine stable  today.    All questions answered and patient is in agreement with recommendations and plans. Taxol, xgeva, granix orders placed. TIme spent 30 min including >50% counseling and coordination of care. Route Dr Philip Aspen   Evlyn Clines, MD   06/27/2016, 2:50 PM

## 2016-06-27 NOTE — Patient Instructions (Signed)
[facility] Discharge Instructions for Patients Receiving Chemotherapy  Today you received the following chemotherapy agents Taxol.  To help prevent nausea and vomiting after your treatment, we encourage you to take your nausea medication as directed.    If you develop nausea and vomiting that is not controlled by your nausea medication, call the clinic.   BELOW ARE SYMPTOMS THAT SHOULD BE REPORTED IMMEDIATELY:  *FEVER GREATER THAN 100.5 F  *CHILLS WITH OR WITHOUT FEVER  NAUSEA AND VOMITING THAT IS NOT CONTROLLED WITH YOUR NAUSEA MEDICATION  *UNUSUAL SHORTNESS OF BREATH  *UNUSUAL BRUISING OR BLEEDING  TENDERNESS IN MOUTH AND THROAT WITH OR WITHOUT PRESENCE OF ULCERS  *URINARY PROBLEMS  *BOWEL PROBLEMS  UNUSUAL RASH Items with * indicate a potential emergency and should be followed up as soon as possible.  Feel free to call the clinic you have any questions or concerns. The clinic phone number is [phone].    

## 2016-06-28 ENCOUNTER — Ambulatory Visit (HOSPITAL_BASED_OUTPATIENT_CLINIC_OR_DEPARTMENT_OTHER): Payer: Medicare Other

## 2016-06-28 ENCOUNTER — Other Ambulatory Visit: Payer: Self-pay | Admitting: Oncology

## 2016-06-28 VITALS — BP 137/88 | HR 69 | Temp 97.9°F | Resp 18

## 2016-06-28 DIAGNOSIS — D701 Agranulocytosis secondary to cancer chemotherapy: Secondary | ICD-10-CM | POA: Diagnosis not present

## 2016-06-28 DIAGNOSIS — C50919 Malignant neoplasm of unspecified site of unspecified female breast: Secondary | ICD-10-CM

## 2016-06-28 DIAGNOSIS — C50911 Malignant neoplasm of unspecified site of right female breast: Secondary | ICD-10-CM

## 2016-06-28 DIAGNOSIS — C7951 Secondary malignant neoplasm of bone: Principal | ICD-10-CM

## 2016-06-28 MED ORDER — TBO-FILGRASTIM 300 MCG/0.5ML ~~LOC~~ SOSY
300.0000 ug | PREFILLED_SYRINGE | Freq: Once | SUBCUTANEOUS | Status: AC
  Administered 2016-06-28: 300 ug via SUBCUTANEOUS
  Filled 2016-06-28: qty 0.5

## 2016-06-28 NOTE — Patient Instructions (Signed)

## 2016-06-29 ENCOUNTER — Other Ambulatory Visit: Payer: Self-pay | Admitting: Oncology

## 2016-06-29 DIAGNOSIS — D701 Agranulocytosis secondary to cancer chemotherapy: Secondary | ICD-10-CM | POA: Insufficient documentation

## 2016-06-29 DIAGNOSIS — T451X5A Adverse effect of antineoplastic and immunosuppressive drugs, initial encounter: Secondary | ICD-10-CM

## 2016-07-02 ENCOUNTER — Telehealth: Payer: Self-pay

## 2016-07-02 NOTE — Telephone Encounter (Signed)
-----   Message from Gordy Levan, MD sent at 06/29/2016  2:39 PM EST ----- Regarding: flu shot She really needs a flu shot, which I did not remember to address again at visit 12-7. Could you please check on her by phone 12-11 or 12-12, see how she did with first Xgeva on 12-7 and ask again if she will consider flu shot. It is not a live virus, so she will not get sick from the shot. Flu season already started, and even if not great protection from the flu vaccine, it may give some benefit. It takes 2 weeks after vaccine to get immunity.  I would prefer not to give on day of chemo, but can do that if unable to manage otherwise.   thanks
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
## 2016-07-03 ENCOUNTER — Other Ambulatory Visit: Payer: Self-pay | Admitting: Oncology

## 2016-07-03 DIAGNOSIS — C50911 Malignant neoplasm of unspecified site of right female breast: Secondary | ICD-10-CM

## 2016-07-04 ENCOUNTER — Ambulatory Visit (HOSPITAL_BASED_OUTPATIENT_CLINIC_OR_DEPARTMENT_OTHER): Payer: Medicare Other | Admitting: Oncology

## 2016-07-04 ENCOUNTER — Encounter: Payer: Self-pay | Admitting: Oncology

## 2016-07-04 ENCOUNTER — Ambulatory Visit: Payer: Medicare Other

## 2016-07-04 ENCOUNTER — Other Ambulatory Visit (HOSPITAL_BASED_OUTPATIENT_CLINIC_OR_DEPARTMENT_OTHER): Payer: Medicare Other

## 2016-07-04 VITALS — BP 122/64 | HR 69 | Temp 97.7°F | Resp 18 | Ht 62.0 in | Wt 164.6 lb

## 2016-07-04 DIAGNOSIS — D701 Agranulocytosis secondary to cancer chemotherapy: Secondary | ICD-10-CM

## 2016-07-04 DIAGNOSIS — C50911 Malignant neoplasm of unspecified site of right female breast: Secondary | ICD-10-CM | POA: Diagnosis not present

## 2016-07-04 DIAGNOSIS — Z7901 Long term (current) use of anticoagulants: Secondary | ICD-10-CM

## 2016-07-04 DIAGNOSIS — G893 Neoplasm related pain (acute) (chronic): Secondary | ICD-10-CM

## 2016-07-04 DIAGNOSIS — T451X5A Adverse effect of antineoplastic and immunosuppressive drugs, initial encounter: Secondary | ICD-10-CM

## 2016-07-04 LAB — CBC WITH DIFFERENTIAL/PLATELET
BASO%: 0 % (ref 0.0–2.0)
Basophils Absolute: 0 10*3/uL (ref 0.0–0.1)
EOS%: 0 % (ref 0.0–7.0)
Eosinophils Absolute: 0 10*3/uL (ref 0.0–0.5)
HEMATOCRIT: 32.6 % — AB (ref 34.8–46.6)
HEMOGLOBIN: 11.2 g/dL — AB (ref 11.6–15.9)
LYMPH#: 0.3 10*3/uL — AB (ref 0.9–3.3)
LYMPH%: 45.5 % (ref 14.0–49.7)
MCH: 30.6 pg (ref 25.1–34.0)
MCHC: 34.4 g/dL (ref 31.5–36.0)
MCV: 89.1 fL (ref 79.5–101.0)
MONO#: 0 10*3/uL — AB (ref 0.1–0.9)
MONO%: 4.5 % (ref 0.0–14.0)
NEUT%: 50 % (ref 38.4–76.8)
NEUTROS ABS: 0.3 10*3/uL — AB (ref 1.5–6.5)
NRBC: 0 % (ref 0–0)
Platelets: 232 10*3/uL (ref 145–400)
RBC: 3.66 10*6/uL — ABNORMAL LOW (ref 3.70–5.45)
RDW: 14.3 % (ref 11.2–14.5)
WBC: 0.7 10*3/uL — AB (ref 3.9–10.3)

## 2016-07-04 LAB — COMPREHENSIVE METABOLIC PANEL
ALBUMIN: 3 g/dL — AB (ref 3.5–5.0)
ALK PHOS: 97 U/L (ref 40–150)
ALT: 19 U/L (ref 0–55)
AST: 21 U/L (ref 5–34)
Anion Gap: 12 mEq/L — ABNORMAL HIGH (ref 3–11)
BUN: 18.5 mg/dL (ref 7.0–26.0)
CO2: 16 mEq/L — ABNORMAL LOW (ref 22–29)
CREATININE: 1 mg/dL (ref 0.6–1.1)
Calcium: 9.1 mg/dL (ref 8.4–10.4)
Chloride: 108 mEq/L (ref 98–109)
EGFR: 55 mL/min/{1.73_m2} — ABNORMAL LOW (ref >90)
GLUCOSE: 247 mg/dL — AB (ref 70–140)
POTASSIUM: 4.2 meq/L (ref 3.5–5.1)
SODIUM: 136 meq/L (ref 136–145)
Total Bilirubin: 0.79 mg/dL (ref 0.20–1.20)
Total Protein: 6.5 g/dL (ref 6.4–8.3)

## 2016-07-04 MED ORDER — TBO-FILGRASTIM 300 MCG/0.5ML ~~LOC~~ SOSY
300.0000 ug | PREFILLED_SYRINGE | Freq: Once | SUBCUTANEOUS | Status: AC
  Administered 2016-07-04: 300 ug via SUBCUTANEOUS
  Filled 2016-07-04: qty 0.5

## 2016-07-04 NOTE — Progress Notes (Signed)
OFFICE PROGRESS NOTE   July 04, 2016   Physicians: J.Kinard, [name] (PCP), (R.Sharma), A.Kendall. K.Cabbell.,M.Croitoru  INTERVAL HISTORY:  Patient is seen in continuing attention to metastatic breast cancer involving bone and lung, now receiving weekly taxol with granix support, also first xgeva given 06-27-16 (previous zometa). Planned day 8 cycle 2 taxol will be held today due to  [place] 0.3, but she is not febrile and is feeling better overall (did take premed decadron for planned taxol today).  No problems with Delton See 06-27-16  Patient had day 1 cycle 2 on 06-27-16 with ANC 1.4, then granix 300 mg on 12-8. She was very fatigued for ~ 2 days after chemo/ xgeva. She has had no nausea and appetite is fine now. She was not SOB walking into office today, only trace LLE > RLE swelling now. No fatigue similar to Afib RVR symptoms. Still sleeps on 2 pillows due to wheezing when lies supine. No significant cough, no sputum, no chest pain, not aware of palpitations.  She has minimal discomfort in back in area of previous surgery, none now in right hip/ pelvis. No peripheral neuropathy. Bowels ok. Bladder ok. No bleeding. Remainder of 10 point Review of Systems negative.    No central line Patient now agrees to flu vaccine, but will not give today with neutropenia. 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 CA 2729 was 434 in April 2010. She has tolerated Femara well and clinically has continued to do very well, though CA  27.29 has never normalized. 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 Patient seen by medical oncology 08-10-15 and radiation begun shortly afterwards by Dr Sondra Come, 22.5 of planned 35 Gy radiation to area of T10 thru 08-29-15. She had first zometa 08-11-15. MRI T spine obtained after some difficulty on 08-29-15, with pathologic compression fracture T10 with retropulsion of compressed vertebra to right with compression of cord called, involvement also T9, T11, L2. She had thoracic six- lumbar one posterolateral arthrodesis, T10 laminectomy, partial laminectomy T9, T11 by Dr Christella Noa on 09-01-15. She was begun on Aromasin on 09-07-15.  PET had been scheduled for 09-04-15, delayed with surgery, accomplished on 09-20-15 with chest adenopathy, increased soft tissue right lung base, extensive bone involvement.  Repeat PET 12-22-15 some improvement in area of radiation T spine, otherwise no improvement in bones, some increase right femur, some central chest adenopathy and right lower chest/ pleural involvement. Aromasin was discontinued and xeloda begun 01-05-16, used thru 04-29-16.  BRCA negative by [facility] 28 gene panel by [facility] 03-2016. Progressed in lung and bones by PET 05-02-16. Weekly taxol begun 05-09-16, tho interrupted due to new onset A fib early 05-2016. Radiation to right pelvis / hip ~ 05-21-16 thru 06-06-16, 28 Gy  Objective:  Vital signs in last 24 hours: Weight 164 lb/ 9 oz, down 2 lbs. 122/64, 69 regular, 18 not labored at rest RA, 97.7, O2 sat 99%  Alert, oriented and appropriate. Ambulatory with rolling walker.  Alopecia  HEENT:PERRL, sclerae not  icteric. Oral mucosa moist without lesions, posterior pharynx clear.  Neck supple. No JVD.  Lymphatics:no supraclavicular adenopathy Resp: no wheezes or rales. Decreased BS right base as previously Cardio: regular rate and rhythm. No gallop. GI: soft, nontender, not distended, no mass  or organomegaly. Normally active bowel sounds.  Musculoskeletal/ Extremities: trace LE pedal edema L>R, without pitting edema, cords, tenderness Neuro: no peripheral neuropathy. Otherwise nonfocal. PSYCH appropriate mood and affect Skin without rash, ecchymosis, petechiae Breasts: right mastectomy   Lab Results:  Results for orders placed or performed in visit on 06/27/16  CBC with Differential  Result Value Ref Range   WBC 1.9 (L) 3.9 - 10.3 10e3/uL   NEUT# 1.4 (L) 1.5 - 6.5 10e3/uL   HGB 13.0 11.6 - 15.9 g/dL   HCT 37.6 34.8 - 46.6 %   Platelets 234 145 - 400 10e3/uL   MCV 90.2 79.5 - 101.0 fL   MCH 31.2 25.1 - 34.0 pg   MCHC 34.6 31.5 - 36.0 g/dL   RBC 4.17 3.70 - 5.45 10e6/uL   RDW 14.1 11.2 - 14.5 %   lymph# 0.5 (L) 0.9 - 3.3 10e3/uL   MONO# 0.0 (L) 0.1 - 0.9 10e3/uL   Eosinophils Absolute 0.0 0.0 - 0.5 10e3/uL   Basophils Absolute 0.0 0.0 - 0.1 10e3/uL   NEUT% 75.4 38.4 - 76.8 %   LYMPH% 23.6 14.0 - 49.7 %   MONO% 1.0 0.0 - 14.0 %   EOS% 0.0 0.0 - 7.0 %   BASO% 0.0 0.0 - 2.0 %   nRBC 0 0 - 0 %  Comprehensive metabolic panel  Result Value Ref Range   Sodium 141 136 - 145 mEq/L   Potassium 3.9 3.5 - 5.1 mEq/L   Chloride 109 98 - 109 mEq/L   CO2 20 (L) 22 - 29 mEq/L   Glucose 190 (H) 70 - 140 mg/dl   BUN 20.0 7.0 - 26.0 mg/dL   Creatinine 1.1 0.6 - 1.1 mg/dL   Total Bilirubin 0.94 0.20 - 1.20 mg/dL   Alkaline Phosphatase 114 40 - 150 U/L   AST 27 5 - 34 U/L   ALT 22 0 - 55 U/L   Total Protein 7.2 6.4 - 8.3 g/dL   Albumin 3.4 (L) 3.5 - 5.0 g/dL   Calcium 10.2 8.4 - 10.4 mg/dL   Anion Gap 12 (H) 3 - 11 mEq/L   EGFR 49 (L) >90 ml/min/1.73 m2   CA 15-3 available after visit 243, this having been 281 on 04-29-16 CA 2729 available after visit 259, this having been 353 on 04-29-16  Corrected Ca++ still WNL today  Studies/Results:  No results found.  Medications: I have reviewed the patient's current medications. Continues lasix 20 mg daily per cardiology. Has  beenn on potassium 10 mEq daily, may not need with K 4.2 today, follow.  Granix 300 mg today No flu shot today with neutropenia. Will try to get this done on nonchemo day if possible, but already late as patient refused earlier.   With next and subsequent taxol, will increase granix to 480 mg, trying one injection day after each treatment. Granix 480 orders placed under sign and held for 12-22, 12-29, 1-5, 1-12, 1-19, 1-26  DISCUSSION Neutropenia and neutropenic precautions discussed. Additional granix today, hold chemo today. Repeat labs possible granix 12-15.   Even despite neutropenia she looks better overall. WIll continue weekly taxol as possible, with xgeva monthly. Consider faslodex + ibrance after 4-6 cycles if improved.   Assessment/Plan:  1. progressive metastatic breast cancer to lungs andextensive bone involvement by PET 05-02-16: Clinically improving now on weekly taxol beginning 05-09-16, several delays,  #4 given 06-27-16 with granix 12-8.Neutropenic today due to chemo, recent pelvic RT and extent of bone involvement, additional granix and follow. Continue xgeva monthly from 06-27-16. . Palliative radiation to right iliac area completed 06-06-16.  Pathologic compression fracture T10 and retropulsion of bone impinging on spinal cord for which she had T6 - L1 laminectomy by Dr Christella Noa 09-01-15.  2.Atrial fibrillation with RVR: hospitalized 11-1 thru 05-31-16, cardioversion 05-27-16, on Eliquis, lopressor, amiodarone. To see Dr Sallyanne Kuster 07-30-15.  Apparent CHF with orthopnea, CXR findings, and pedal edema without DVT. Clinically improved now on lasix 20 mg daily by cardiology, tho not clear why she has the CHF. 3.. Mild hypercalcemia resolved, follow, continue xgeva (previously zometa).  4..needs flu vaccine, which she finally agrees to have, but cannot do today with neutropenia. Will try to get this done as soon as possible. 5.diarrhea Jan treated for C diff ( Ab + toxin -) and  resolved. 6.remote past tobacco, none since 1998 7.social situation: patient is primary caregiver for 102 yo mother at their home.  8.Advance Directives in place .9. Deconditioning multifactorial. 10. CKD III. Creatinine stable today.   All questions answered and patient understands recommendations and plans. Chemo and granix orders adjusted, weekly taxol requested thru late Jan with granix day after each.  Time spent 25 min including >50% counseling and coordination of care. Cc Drs Philip Aspen and Delena Serve, MD   07/04/2016, 9:23 AM

## 2016-07-05 ENCOUNTER — Ambulatory Visit: Payer: Medicare Other

## 2016-07-05 ENCOUNTER — Other Ambulatory Visit (HOSPITAL_BASED_OUTPATIENT_CLINIC_OR_DEPARTMENT_OTHER): Payer: Medicare Other

## 2016-07-05 ENCOUNTER — Other Ambulatory Visit: Payer: Self-pay

## 2016-07-05 ENCOUNTER — Other Ambulatory Visit: Payer: Self-pay | Admitting: Oncology

## 2016-07-05 DIAGNOSIS — C50911 Malignant neoplasm of unspecified site of right female breast: Secondary | ICD-10-CM

## 2016-07-05 LAB — CBC WITH DIFFERENTIAL/PLATELET
BASO%: 0.3 % (ref 0.0–2.0)
Basophils Absolute: 0 10*3/uL (ref 0.0–0.1)
EOS ABS: 0 10*3/uL (ref 0.0–0.5)
EOS%: 0.1 % (ref 0.0–7.0)
HEMATOCRIT: 35.8 % (ref 34.8–46.6)
HEMOGLOBIN: 11.7 g/dL (ref 11.6–15.9)
LYMPH%: 11.4 % — ABNORMAL LOW (ref 14.0–49.7)
MCH: 30.4 pg (ref 25.1–34.0)
MCHC: 32.7 g/dL (ref 31.5–36.0)
MCV: 93 fL (ref 79.5–101.0)
MONO#: 1 10*3/uL — AB (ref 0.1–0.9)
MONO%: 24 % — ABNORMAL HIGH (ref 0.0–14.0)
NEUT%: 64.2 % (ref 38.4–76.8)
NEUTROS ABS: 2.7 10*3/uL (ref 1.5–6.5)
PLATELETS: 270 10*3/uL (ref 145–400)
RBC: 3.85 10*6/uL (ref 3.70–5.45)
RDW: 15.2 % — AB (ref 11.2–14.5)
WBC: 4.2 10*3/uL (ref 3.9–10.3)
lymph#: 0.5 10*3/uL — ABNORMAL LOW (ref 0.9–3.3)

## 2016-07-05 LAB — CANCER ANTIGEN 27.29: CA 27.29: 259.1 U/mL — ABNORMAL HIGH (ref 0.0–38.6)

## 2016-07-05 LAB — CANCER ANTIGEN 15-3: CAN 15 3: 243.5 U/mL — AB (ref 0.0–25.0)

## 2016-07-05 MED ORDER — AMIODARONE HCL 200 MG PO TABS: 200.0000 mg | ORAL_TABLET | Freq: Every day | ORAL | 3 refills | Status: DC

## 2016-07-05 MED FILL — AMIODARONE HCL 200 MG TAB: 200 | 30 days supply | Qty: 30 | Fill #0

## 2016-07-05 MED FILL — POTASSIUM CL ER 10 MEQ TAB: 10 | 30 days supply | Qty: 30 | Fill #0

## 2016-07-05 NOTE — Progress Notes (Signed)
ANC  Noted at 2.7, no injection needed per Dr. Marko Plume order. Pt refused flu shot when offered. Pt encouraged to get Flu vaccine, but still declines at this time

## 2016-07-06 ENCOUNTER — Ambulatory Visit: Payer: Medicare Other

## 2016-07-06 DIAGNOSIS — D701 Agranulocytosis secondary to cancer chemotherapy: Secondary | ICD-10-CM | POA: Insufficient documentation

## 2016-07-06 DIAGNOSIS — T451X5A Adverse effect of antineoplastic and immunosuppressive drugs, initial encounter: Secondary | ICD-10-CM | POA: Insufficient documentation

## 2016-07-09 ENCOUNTER — Ambulatory Visit (HOSPITAL_BASED_OUTPATIENT_CLINIC_OR_DEPARTMENT_OTHER): Payer: Medicare Other

## 2016-07-09 ENCOUNTER — Other Ambulatory Visit: Payer: Self-pay | Admitting: Oncology

## 2016-07-09 DIAGNOSIS — Z23 Encounter for immunization: Secondary | ICD-10-CM | POA: Diagnosis not present

## 2016-07-09 DIAGNOSIS — C50911 Malignant neoplasm of unspecified site of right female breast: Secondary | ICD-10-CM

## 2016-07-09 DIAGNOSIS — C7951 Secondary malignant neoplasm of bone: Secondary | ICD-10-CM

## 2016-07-09 LAB — CBC WITH DIFFERENTIAL/PLATELET
BASO%: 0.4 % (ref 0.0–2.0)
Basophils Absolute: 0 10*3/uL (ref 0.0–0.1)
EOS%: 0.6 % (ref 0.0–7.0)
Eosinophils Absolute: 0 10*3/uL (ref 0.0–0.5)
HCT: 37.9 % (ref 34.8–46.6)
HGB: 12.5 g/dL (ref 11.6–15.9)
LYMPH%: 11.6 % — AB (ref 14.0–49.7)
MCH: 30.1 pg (ref 25.1–34.0)
MCHC: 33 g/dL (ref 31.5–36.0)
MCV: 91.4 fL (ref 79.5–101.0)
MONO#: 1.1 10*3/uL — AB (ref 0.1–0.9)
MONO%: 13 % (ref 0.0–14.0)
NEUT#: 6.1 10*3/uL (ref 1.5–6.5)
NEUT%: 74.4 % (ref 38.4–76.8)
PLATELETS: 268 10*3/uL (ref 145–400)
RBC: 4.14 10*6/uL (ref 3.70–5.45)
RDW: 16 % — ABNORMAL HIGH (ref 11.2–14.5)
WBC: 8.2 10*3/uL (ref 3.9–10.3)
lymph#: 1 10*3/uL (ref 0.9–3.3)

## 2016-07-09 LAB — COMPREHENSIVE METABOLIC PANEL
ALT: 21 U/L (ref 0–55)
ANION GAP: 9 meq/L (ref 3–11)
AST: 26 U/L (ref 5–34)
Albumin: 3.3 g/dL — ABNORMAL LOW (ref 3.5–5.0)
Alkaline Phosphatase: 93 U/L (ref 40–150)
BUN: 20.1 mg/dL (ref 7.0–26.0)
CHLORIDE: 108 meq/L (ref 98–109)
CO2: 21 meq/L — AB (ref 22–29)
Calcium: 9.4 mg/dL (ref 8.4–10.4)
Creatinine: 1.3 mg/dL — ABNORMAL HIGH (ref 0.6–1.1)
EGFR: 41 mL/min/{1.73_m2} — AB (ref >90)
GLUCOSE: 125 mg/dL (ref 70–140)
Potassium: 3.8 mEq/L (ref 3.5–5.1)
SODIUM: 139 meq/L (ref 136–145)
Total Bilirubin: 0.9 mg/dL (ref 0.20–1.20)
Total Protein: 6.7 g/dL (ref 6.4–8.3)

## 2016-07-09 MED ORDER — INFLUENZA VAC SPLIT QUAD 0.5 ML IM SUSY
0.5000 mL | PREFILLED_SYRINGE | Freq: Once | INTRAMUSCULAR | Status: AC
  Administered 2016-07-09: 0.5 mL via INTRAMUSCULAR
  Filled 2016-07-09: qty 0.5

## 2016-07-11 ENCOUNTER — Other Ambulatory Visit: Payer: Medicare Other

## 2016-07-11 ENCOUNTER — Other Ambulatory Visit: Payer: Self-pay | Admitting: Oncology

## 2016-07-11 ENCOUNTER — Ambulatory Visit (HOSPITAL_BASED_OUTPATIENT_CLINIC_OR_DEPARTMENT_OTHER): Payer: Medicare Other

## 2016-07-11 VITALS — BP 141/61 | HR 65 | Temp 98.4°F | Resp 18

## 2016-07-11 DIAGNOSIS — Z5111 Encounter for antineoplastic chemotherapy: Secondary | ICD-10-CM | POA: Diagnosis not present

## 2016-07-11 DIAGNOSIS — C50911 Malignant neoplasm of unspecified site of right female breast: Secondary | ICD-10-CM

## 2016-07-11 MED ORDER — DEXAMETHASONE SODIUM PHOSPHATE 10 MG/ML IJ SOLN
10.0000 mg | Freq: Once | INTRAMUSCULAR | Status: AC
  Administered 2016-07-11: 10 mg via INTRAVENOUS

## 2016-07-11 MED ORDER — SODIUM CHLORIDE 0.9 % IV SOLN
80.0000 mg/m2 | Freq: Once | INTRAVENOUS | Status: AC
  Administered 2016-07-11: 150 mg via INTRAVENOUS
  Filled 2016-07-11: qty 25

## 2016-07-11 MED ORDER — SODIUM CHLORIDE 0.9 % IV SOLN
Freq: Once | INTRAVENOUS | Status: AC
  Administered 2016-07-11: 14:00:00 via INTRAVENOUS

## 2016-07-11 MED ORDER — LORATADINE 10 MG PO TABS: 10.0000 mg | ORAL_TABLET | Freq: Every day | ORAL | Status: DC

## 2016-07-11 MED ORDER — FAMOTIDINE IN NACL 20-0.9 MG/50ML-% IV SOLN
20.0000 mg | Freq: Once | INTRAVENOUS | Status: AC
  Administered 2016-07-11: 20 mg via INTRAVENOUS

## 2016-07-11 MED ORDER — FAMOTIDINE IN NACL 20-0.9 MG/50ML-% IV SOLN
INTRAVENOUS | Status: AC
  Filled 2016-07-11: qty 50

## 2016-07-11 MED ORDER — ONDANSETRON HCL 8 MG PO TABS
8.0000 mg | ORAL_TABLET | Freq: Once | ORAL | Status: AC
  Administered 2016-07-11: 8 mg via ORAL

## 2016-07-11 MED ORDER — DEXAMETHASONE SODIUM PHOSPHATE 10 MG/ML IJ SOLN
INTRAMUSCULAR | Status: AC
  Filled 2016-07-11: qty 1

## 2016-07-11 MED ORDER — ONDANSETRON HCL 8 MG PO TABS
ORAL_TABLET | ORAL | Status: AC
  Filled 2016-07-11: qty 1

## 2016-07-11 NOTE — Patient Instructions (Signed)
[facility] Discharge Instructions for Patients Receiving Chemotherapy  Today you received the following chemotherapy agents Taxol.  To help prevent nausea and vomiting after your treatment, we encourage you to take your nausea medication as directed.    If you develop nausea and vomiting that is not controlled by your nausea medication, call the clinic.   BELOW ARE SYMPTOMS THAT SHOULD BE REPORTED IMMEDIATELY:  *FEVER GREATER THAN 100.5 F  *CHILLS WITH OR WITHOUT FEVER  NAUSEA AND VOMITING THAT IS NOT CONTROLLED WITH YOUR NAUSEA MEDICATION  *UNUSUAL SHORTNESS OF BREATH  *UNUSUAL BRUISING OR BLEEDING  TENDERNESS IN MOUTH AND THROAT WITH OR WITHOUT PRESENCE OF ULCERS  *URINARY PROBLEMS  *BOWEL PROBLEMS  UNUSUAL RASH Items with * indicate a potential emergency and should be followed up as soon as possible.  Feel free to call the clinic you have any questions or concerns. The clinic phone number is [phone].    

## 2016-07-12 ENCOUNTER — Other Ambulatory Visit: Payer: Self-pay | Admitting: Oncology

## 2016-07-12 ENCOUNTER — Telehealth: Payer: Self-pay

## 2016-07-12 ENCOUNTER — Ambulatory Visit: Payer: Medicare Other

## 2016-07-12 ENCOUNTER — Ambulatory Visit (HOSPITAL_BASED_OUTPATIENT_CLINIC_OR_DEPARTMENT_OTHER): Payer: Medicare Other | Admitting: Oncology

## 2016-07-12 ENCOUNTER — Other Ambulatory Visit (HOSPITAL_BASED_OUTPATIENT_CLINIC_OR_DEPARTMENT_OTHER): Payer: Medicare Other

## 2016-07-12 ENCOUNTER — Ambulatory Visit: Payer: Medicare Other | Admitting: Oncology

## 2016-07-12 VITALS — BP 137/94 | HR 70 | Temp 98.1°F | Resp 17 | Ht 62.0 in | Wt 164.2 lb

## 2016-07-12 DIAGNOSIS — C50911 Malignant neoplasm of unspecified site of right female breast: Secondary | ICD-10-CM

## 2016-07-12 DIAGNOSIS — Z7901 Long term (current) use of anticoagulants: Secondary | ICD-10-CM

## 2016-07-12 DIAGNOSIS — N183 Chronic kidney disease, stage 3 (moderate): Secondary | ICD-10-CM

## 2016-07-12 DIAGNOSIS — D701 Agranulocytosis secondary to cancer chemotherapy: Secondary | ICD-10-CM | POA: Diagnosis not present

## 2016-07-12 DIAGNOSIS — C7951 Secondary malignant neoplasm of bone: Principal | ICD-10-CM

## 2016-07-12 DIAGNOSIS — T451X5A Adverse effect of antineoplastic and immunosuppressive drugs, initial encounter: Secondary | ICD-10-CM

## 2016-07-12 DIAGNOSIS — C7800 Secondary malignant neoplasm of unspecified lung: Secondary | ICD-10-CM | POA: Diagnosis not present

## 2016-07-12 LAB — COMPREHENSIVE METABOLIC PANEL
ALT: 18 U/L (ref 0–55)
AST: 21 U/L (ref 5–34)
Albumin: 3.3 g/dL — ABNORMAL LOW (ref 3.5–5.0)
Alkaline Phosphatase: 89 U/L (ref 40–150)
Anion Gap: 8 mEq/L (ref 3–11)
BUN: 21.2 mg/dL (ref 7.0–26.0)
CHLORIDE: 108 meq/L (ref 98–109)
CO2: 21 meq/L — AB (ref 22–29)
CREATININE: 1 mg/dL (ref 0.6–1.1)
Calcium: 9.2 mg/dL (ref 8.4–10.4)
EGFR: 55 mL/min/{1.73_m2} — ABNORMAL LOW (ref >90)
Glucose: 156 mg/dl — ABNORMAL HIGH (ref 70–140)
Potassium: 4.1 mEq/L (ref 3.5–5.1)
SODIUM: 137 meq/L (ref 136–145)
Total Bilirubin: 0.7 mg/dL (ref 0.20–1.20)
Total Protein: 6.5 g/dL (ref 6.4–8.3)

## 2016-07-12 LAB — CBC WITH DIFFERENTIAL/PLATELET
BASO%: 0.8 % (ref 0.0–2.0)
BASOS ABS: 0.1 10*3/uL (ref 0.0–0.1)
EOS%: 0 % (ref 0.0–7.0)
Eosinophils Absolute: 0 10*3/uL (ref 0.0–0.5)
HEMATOCRIT: 35.5 % (ref 34.8–46.6)
HGB: 11.7 g/dL (ref 11.6–15.9)
LYMPH#: 0.5 10*3/uL — AB (ref 0.9–3.3)
LYMPH%: 6 % — AB (ref 14.0–49.7)
MCH: 30.1 pg (ref 25.1–34.0)
MCHC: 33.1 g/dL (ref 31.5–36.0)
MCV: 91 fL (ref 79.5–101.0)
MONO#: 0.6 10*3/uL (ref 0.1–0.9)
MONO%: 7.1 % (ref 0.0–14.0)
NEUT#: 6.8 10*3/uL — ABNORMAL HIGH (ref 1.5–6.5)
NEUT%: 86.1 % — AB (ref 38.4–76.8)
PLATELETS: 246 10*3/uL (ref 145–400)
RBC: 3.9 10*6/uL (ref 3.70–5.45)
RDW: 15.7 % — ABNORMAL HIGH (ref 11.2–14.5)
WBC: 7.9 10*3/uL (ref 3.9–10.3)

## 2016-07-12 MED ORDER — DEXAMETHASONE 4 MG PO TABS: ORAL_TABLET | ORAL | 0 refills | Status: DC

## 2016-07-12 NOTE — Telephone Encounter (Signed)
lvm that decadron was e-scribed to [facility] outpatient pharmacy.

## 2016-07-12 NOTE — Telephone Encounter (Signed)
-----   Message from Gordy Levan, MD sent at 07/12/2016  1:03 PM EST ----- Regarding: script Please send decadron to [facility] = 16 mg 12 hrs before taxol take with food  #12 for 3 weeks  thanks

## 2016-07-12 NOTE — Progress Notes (Signed)
OFFICE PROGRESS NOTE   July 12, 2016   Physicians: J.Kinard, [name] (PCP), (R.Sharma), A.Kendall. 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 She had first xgeva 06-27-16, previously had zometa.   Patient had day 8 cycle 2 weekly taxol on 07-11-16, with granix 300 mcg on 12-8.  ANC was 0.3 on 12-4, weekly taxol delayed and granix repeated. ANC up to 2.7 by 12-15.  She tolerated taxol "day 15" cycle 2 on 12-21 without any different problems, tho does not sleep after premed decadron. She denies nausea, has been able to eat and bowels are moving. She feels some "stretching" across upper back and upper arms bilaterally after granix, no significant taxol aches. No bleeding on the Eliquis, no chest pain, pulse has been regular at her own frequent checks, no palpitations and has not felt weak. No SOB when she walks slowly. No pain including back or right hip. No fever or symptoms of infection. No problems with flu vaccine, which she finally agreed to have. No problems per patient with peripheral IV access. Still sleeping propped on pillows, notices wheezing when she lies flat, no LE swelling. Chronic environmental allergies. No significant peripheral neuropathy. Remainder of 10 point Review of Systems negative.   No central catheter Flu vaccine 07-09-16 No genetics testing Eliquis for A fib  Note right axillary nodes x 12 1998 (no radiation right breast/ axilla)  Patient's 73 yo mother is hospitalized at [facility] with CHF.  ONCOLOGIC HISTORY Right breast carcinoma T2 N1, ER/PR-positive December 1998, treated with mastectomy with 12 node axillary evaluation and adjuvant Adriamycin/Cytoxan followed by 5 years of tamoxifen through December 2003. The breast cancer was found metastatic to pleura, hormone positive in early  2010, treated with VATS sclerosis by Dr.Burney April 2010 and on Femara also since April 2010. CA 2729 was 434 in April 2010. She has tolerated Femara well and clinically has continued to do very well, though CA 27.29 has never normalized. 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 Patient seen by medical oncology 08-10-15 and radiation begun shortly afterwards by Dr Sondra Come, 22.5 of planned 35 Gy radiation to area of T10 thru 08-29-15. She had first zometa 08-11-15. MRI T spine obtained after some difficulty on 08-29-15, with pathologic compression fracture T10 with retropulsion of compressed vertebra to right with compression of cord called, involvement also T9, T11, L2. She had thoracic six- lumbar one posterolateral arthrodesis, T10 laminectomy, partial laminectomy T9, T11 by Dr Christella Noa on 09-01-15. She was begun on Aromasin on 09-07-15.  PET had been scheduled for 09-04-15, delayed with surgery, accomplished on 09-20-15 with chest adenopathy, increased soft tissue right lung base, extensive bone involvement.  Repeat PET 12-22-15 some improvement in area of radiation T spine, otherwise no improvement in bones, some increase right femur, some central chest adenopathy and right lower chest/ pleural involvement. Aromasin was discontinued and xeloda begun 01-05-16, used thru 04-29-16.  BRCA negative by [facility] 28 gene panel by [facility] 03-2016. Progressed in lung and bones by PET 05-02-16. Weekly taxol begun10-19-17, tho interrupted due to new onset A fib early 05-2016. Zometa used initially, changed to xgeva 06-27-16.  Radiation to right pelvis / hip ~ 05-21-16 thru 06-06-16, 28 Gy.  Objective:  Vital signs in last 24 hours:  BP (!) 137/94 (BP  Location: Left Arm, Patient Position: Sitting)   Pulse 70   Temp 98.1 F (36.7 C) (Oral)   Resp 17   Ht 5' 2"  (1.575 m)   Wt 164 lb 3.2 oz (74.5 kg)   SpO2 98%   BMI 30.03 kg/m  Weight stable Alert,  oriented and appropriate, looks generally comfortable, respirations not labored.Talkative. Ambulatory without assistance, using rolling walker.  Alopecia  HEENT:PERRL, sclerae not icteric. Oral mucosa moist without lesions, posterior pharynx clear.  Neck supple. No JVD.  Lymphatics:no cervical,supraclavicular, axillary or inguinal adenopathy Resp: decreased BS and dullness right lung lower 1/4 posteriorly, stable. Otherwise clear to auscultation bilaterally and normal percussion bilaterally Cardio: regular rate and rhythm. No gallop. GI: soft, nontender, not distended, no mass or organomegaly. Normally active bowel sounds. Surgical incision not remarkable. Musculoskeletal/ Extremities: LE without pitting edema, cords, tenderness. Laminectomy scar well healed, no tenderness spine. Neuro: no peripheral neuropathy. Otherwise nonfocal. PSYCH appropriate mood and affect Skin without rash, ecchymosis, petechiae Breasts: right mastectomy   Lab Results:  Results for orders placed or performed in visit on 07/12/16  CBC with Differential  Result Value Ref Range   WBC 7.9 3.9 - 10.3 10e3/uL   NEUT# 6.8 (H) 1.5 - 6.5 10e3/uL   HGB 11.7 11.6 - 15.9 g/dL   HCT 35.5 34.8 - 46.6 %   Platelets 246 145 - 400 10e3/uL   MCV 91.0 79.5 - 101.0 fL   MCH 30.1 25.1 - 34.0 pg   MCHC 33.1 31.5 - 36.0 g/dL   RBC 3.90 3.70 - 5.45 10e6/uL   RDW 15.7 (H) 11.2 - 14.5 %   lymph# 0.5 (L) 0.9 - 3.3 10e3/uL   MONO# 0.6 0.1 - 0.9 10e3/uL   Eosinophils Absolute 0.0 0.0 - 0.5 10e3/uL   Basophils Absolute 0.1 0.0 - 0.1 10e3/uL   NEUT% 86.1 (H) 38.4 - 76.8 %   LYMPH% 6.0 (L) 14.0 - 49.7 %   MONO% 7.1 0.0 - 14.0 %   EOS% 0.0 0.0 - 7.0 %   BASO% 0.8 0.0 - 2.0 %  Comprehensive metabolic panel  Result Value Ref Range   Sodium 137 136 - 145 mEq/L   Potassium 4.1 3.5 - 5.1 mEq/L   Chloride 108 98 - 109 mEq/L   CO2 21 (L) 22 - 29 mEq/L   Glucose 156 (H) 70 - 140 mg/dl   BUN 21.2 7.0 - 26.0 mg/dL   Creatinine 1.0 0.6  - 1.1 mg/dL   Total Bilirubin 0.70 0.20 - 1.20 mg/dL   Alkaline Phosphatase 89 40 - 150 U/L   AST 21 5 - 34 U/L   ALT 18 0 - 55 U/L   Total Protein 6.5 6.4 - 8.3 g/dL   Albumin 3.3 (L) 3.5 - 5.0 g/dL   Calcium 9.2 8.4 - 10.4 mg/dL   Anion Gap 8 3 - 11 mEq/L   EGFR 55 (L) >90 ml/min/1.73 m2   CA 2729 on 07-04-16 was 259, this 353 on 04-29-16. CA 15-3 on 07-04-16 was 243, this 281 on 04-29-16  Studies/Results:  No results found.  Medications: I have reviewed the patient's current medications. Will decrease oral premed decadron to 16 mg taken 12 hrs prior to taxol. No granix today with ANC 6.8 today, but likely will need possibly every other week for now.  DISCUSSION Meds as above She is doing better clinically and marker also improving. She is glad to continue treatment. If counts ok and she is tolerating, will give the taxol weekly  for now, but may need to do weekly x3 with one week off if does not tolerate.   We have not discussed PAC. She may be able to go to [facility] after situation is hopefully improved with the taxol.    Assessment/Plan:  1. progressive metastatic breast cancer tolungs andextensive bone involvement by PET 05-02-16: Clinically improving now on weekly taxol beginning 05-09-16, several delays, "day 8" cycle 2 given 07-11-16. Needed granix last week with counts low from extensive bone involvement, chemo, recent RT, but ok today, follow. Willl give taxol on 07-18-16 as long as [place] >=1.5 and plt >=100k. WIll give granix 12-29 if ANC < 3.5 day of chemo. Continue xgeva monthly from 06-27-16. . Palliative radiation to right iliac area completed 06-06-16.  Pathologic compression fracture T10 and retropulsion of bone impinging on spinal cord for which she had T6 - L1 laminectomy by Dr Christella Noa 09-01-15.  2.Atrial fibrillation with RVR: hospitalized 11-1 thru 05-31-16, cardioversion 05-27-16, on Eliquis, lopressor, amiodarone. To see Dr Sallyanne Kuster 07-30-15.  Apparent  CHF with orthopnea, CXR findings, and pedal edema without DVT. Clinically improved now on lasix 20 mg daily by cardiology, tho not clear why she has the CHF. 3.. Mild hypercalcemia resolved, follow, continue xgeva (previously zometa). 4.Flu vaccine 07-09-16 5.diarrhea Jan treated for C diff ( Ab + toxin -) and resolved. 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 8.Advance Directives in place .9. Deconditioning multifactorial but improving 10. CKD III. Creatinine stable today 11.environmental allergies: had been on allergy injections x years previously 12.hypercalcemia improved with bisphosphonate and xgeva. Follow  All questions answered and she knows to call if needed prior to next scheduled appointments. Chemo and granix orders confirmed; granix under order entry.  Time spent 25 min including >50% counseling and coordination of care. Route Dr Philip Aspen      Evlyn Clines, MD   07/12/2016, 3:25 PM

## 2016-07-14 ENCOUNTER — Encounter: Payer: Self-pay | Admitting: Oncology

## 2016-07-14 MED ORDER — TBO-FILGRASTIM 300 MCG/0.5ML ~~LOC~~ SOSY: 300.0000 ug | PREFILLED_SYRINGE | Freq: Once | SUBCUTANEOUS | Status: DC

## 2016-07-16 MED FILL — DEXAMETHASONE 4 MG TABLET: 4 | 3 days supply | Qty: 20 | Fill #2

## 2016-07-16 MED FILL — FUROSEMIDE 20 MG TABLET: 20 | 30 days supply | Qty: 30 | Fill #1

## 2016-07-18 ENCOUNTER — Ambulatory Visit (HOSPITAL_BASED_OUTPATIENT_CLINIC_OR_DEPARTMENT_OTHER): Payer: Medicare Other

## 2016-07-18 ENCOUNTER — Other Ambulatory Visit (HOSPITAL_BASED_OUTPATIENT_CLINIC_OR_DEPARTMENT_OTHER): Payer: Medicare Other

## 2016-07-18 VITALS — BP 144/70 | HR 66 | Temp 98.1°F | Resp 16

## 2016-07-18 DIAGNOSIS — C7951 Secondary malignant neoplasm of bone: Principal | ICD-10-CM

## 2016-07-18 DIAGNOSIS — C50911 Malignant neoplasm of unspecified site of right female breast: Secondary | ICD-10-CM

## 2016-07-18 DIAGNOSIS — Z5111 Encounter for antineoplastic chemotherapy: Secondary | ICD-10-CM

## 2016-07-18 LAB — COMPREHENSIVE METABOLIC PANEL
ALT: 23 U/L (ref 0–55)
AST: 26 U/L (ref 5–34)
Albumin: 3.6 g/dL (ref 3.5–5.0)
Alkaline Phosphatase: 98 U/L (ref 40–150)
Anion Gap: 13 mEq/L — ABNORMAL HIGH (ref 3–11)
BILIRUBIN TOTAL: 0.95 mg/dL (ref 0.20–1.20)
BUN: 18.1 mg/dL (ref 7.0–26.0)
CO2: 18 meq/L — AB (ref 22–29)
CREATININE: 1 mg/dL (ref 0.6–1.1)
Calcium: 10 mg/dL (ref 8.4–10.4)
Chloride: 107 mEq/L (ref 98–109)
EGFR: 54 mL/min/{1.73_m2} — AB (ref >90)
GLUCOSE: 155 mg/dL — AB (ref 70–140)
Potassium: 3.7 mEq/L (ref 3.5–5.1)
SODIUM: 138 meq/L (ref 136–145)
TOTAL PROTEIN: 7.2 g/dL (ref 6.4–8.3)

## 2016-07-18 LAB — CBC WITH DIFFERENTIAL/PLATELET
BASO%: 0.7 % (ref 0.0–2.0)
Basophils Absolute: 0 10*3/uL (ref 0.0–0.1)
EOS%: 0.1 % (ref 0.0–7.0)
Eosinophils Absolute: 0 10*3/uL (ref 0.0–0.5)
HCT: 38.1 % (ref 34.8–46.6)
HGB: 12.8 g/dL (ref 11.6–15.9)
LYMPH%: 17.3 % (ref 14.0–49.7)
MCH: 30.4 pg (ref 25.1–34.0)
MCHC: 33.5 g/dL (ref 31.5–36.0)
MCV: 90.9 fL (ref 79.5–101.0)
MONO#: 0 10*3/uL — ABNORMAL LOW (ref 0.1–0.9)
MONO%: 1.2 % (ref 0.0–14.0)
NEUT%: 80.7 % — ABNORMAL HIGH (ref 38.4–76.8)
NEUTROS ABS: 2.3 10*3/uL (ref 1.5–6.5)
Platelets: 270 10*3/uL (ref 145–400)
RBC: 4.19 10*6/uL (ref 3.70–5.45)
RDW: 15.6 % — ABNORMAL HIGH (ref 11.2–14.5)
WBC: 2.9 10*3/uL — AB (ref 3.9–10.3)
lymph#: 0.5 10*3/uL — ABNORMAL LOW (ref 0.9–3.3)

## 2016-07-18 MED ORDER — LORATADINE 10 MG PO TABS: 10.0000 mg | ORAL_TABLET | Freq: Every day | ORAL | Status: DC

## 2016-07-18 MED ORDER — SODIUM CHLORIDE 0.9 % IV SOLN
Freq: Once | INTRAVENOUS | Status: AC
  Administered 2016-07-18: 13:00:00 via INTRAVENOUS

## 2016-07-18 MED ORDER — DEXAMETHASONE SODIUM PHOSPHATE 10 MG/ML IJ SOLN
10.0000 mg | Freq: Once | INTRAMUSCULAR | Status: AC
  Administered 2016-07-18: 10 mg via INTRAVENOUS

## 2016-07-18 MED ORDER — FAMOTIDINE IN NACL 20-0.9 MG/50ML-% IV SOLN
20.0000 mg | Freq: Once | INTRAVENOUS | Status: AC
  Administered 2016-07-18: 20 mg via INTRAVENOUS

## 2016-07-18 MED ORDER — ONDANSETRON HCL 8 MG PO TABS
ORAL_TABLET | ORAL | Status: AC
  Filled 2016-07-18: qty 1

## 2016-07-18 MED ORDER — FAMOTIDINE IN NACL 20-0.9 MG/50ML-% IV SOLN
INTRAVENOUS | Status: AC
  Filled 2016-07-18: qty 50

## 2016-07-18 MED ORDER — DEXAMETHASONE SODIUM PHOSPHATE 10 MG/ML IJ SOLN
INTRAMUSCULAR | Status: AC
  Filled 2016-07-18: qty 1

## 2016-07-18 MED ORDER — ONDANSETRON HCL 8 MG PO TABS
8.0000 mg | ORAL_TABLET | Freq: Once | ORAL | Status: AC
  Administered 2016-07-18: 8 mg via ORAL

## 2016-07-18 MED ORDER — PACLITAXEL CHEMO INJECTION 300 MG/50ML
80.0000 mg/m2 | Freq: Once | INTRAVENOUS | Status: AC
  Administered 2016-07-18: 150 mg via INTRAVENOUS
  Filled 2016-07-18: qty 25

## 2016-07-18 NOTE — Patient Instructions (Signed)
[facility] Discharge Instructions for Patients Receiving Chemotherapy  Today you received the following chemotherapy agents Taxol.  To help prevent nausea and vomiting after your treatment, we encourage you to take your nausea medication as directed.    If you develop nausea and vomiting that is not controlled by your nausea medication, call the clinic.   BELOW ARE SYMPTOMS THAT SHOULD BE REPORTED IMMEDIATELY:  *FEVER GREATER THAN 100.5 F  *CHILLS WITH OR WITHOUT FEVER  NAUSEA AND VOMITING THAT IS NOT CONTROLLED WITH YOUR NAUSEA MEDICATION  *UNUSUAL SHORTNESS OF BREATH  *UNUSUAL BRUISING OR BLEEDING  TENDERNESS IN MOUTH AND THROAT WITH OR WITHOUT PRESENCE OF ULCERS  *URINARY PROBLEMS  *BOWEL PROBLEMS  UNUSUAL RASH Items with * indicate a potential emergency and should be followed up as soon as possible.  Feel free to call the clinic you have any questions or concerns. The clinic phone number is [phone].    

## 2016-07-18 NOTE — Progress Notes (Signed)
@   1415, patient c/o slight discomfort at IV site. IV flushed with 50 mL N/S. Denies pain with flushing. Slight redness noted. No swelling at site. Another IV site obtained.

## 2016-07-19 ENCOUNTER — Other Ambulatory Visit: Payer: Self-pay | Admitting: Oncology

## 2016-07-19 ENCOUNTER — Ambulatory Visit (HOSPITAL_BASED_OUTPATIENT_CLINIC_OR_DEPARTMENT_OTHER): Payer: Medicare Other

## 2016-07-19 DIAGNOSIS — D701 Agranulocytosis secondary to cancer chemotherapy: Secondary | ICD-10-CM

## 2016-07-19 DIAGNOSIS — C50911 Malignant neoplasm of unspecified site of right female breast: Secondary | ICD-10-CM

## 2016-07-19 MED ORDER — TBO-FILGRASTIM 300 MCG/0.5ML ~~LOC~~ SOSY
300.0000 ug | PREFILLED_SYRINGE | Freq: Once | SUBCUTANEOUS | Status: AC
  Administered 2016-07-19: 300 ug via SUBCUTANEOUS
  Filled 2016-07-19: qty 0.5

## 2016-07-19 NOTE — Patient Instructions (Signed)

## 2016-07-23 ENCOUNTER — Other Ambulatory Visit: Payer: Self-pay | Admitting: Oncology

## 2016-07-23 ENCOUNTER — Telehealth: Payer: Self-pay

## 2016-07-23 DIAGNOSIS — C50911 Malignant neoplasm of unspecified site of right female breast: Secondary | ICD-10-CM

## 2016-07-23 NOTE — Telephone Encounter (Signed)
Pt called with 2 issues 1) she fell 12/31 at about 9 pm against sliding glass door. She did hit R side head above the ear. She has no side effects: no dizziness, no headache, no blurry vision, no slurred speech, no drainage from ears or nose. She is letting us know 2) she called afterhours on 12/28 or 12/29 for sore throat, itchy throat, chest congestion she was able to cough out. She did get an rx for cipro and the symptoms are mostly gone. She stated Tmax was 100.6. Again letting us know.  She did state she felt better after the call ie worried about the sore throat.

## 2016-07-24 ENCOUNTER — Telehealth: Payer: Self-pay

## 2016-07-24 NOTE — Telephone Encounter (Signed)
Pt called earlier about possibly not taking decadron tonight in anticipation of Dr Marko Plume cancelling chemo d/t her chest cold. Called pt back and discussed situation. Pt decided she will take decadron tonight. She discussed it is decreased from 20 mg to 16 mg.

## 2016-07-25 ENCOUNTER — Encounter (HOSPITAL_COMMUNITY): Payer: Self-pay | Admitting: Emergency Medicine

## 2016-07-25 ENCOUNTER — Ambulatory Visit: Payer: Medicare Other

## 2016-07-25 ENCOUNTER — Emergency Department (HOSPITAL_COMMUNITY)
Admission: EM | Admit: 2016-07-25 | Discharge: 2016-07-25 | Disposition: A | Discharge status: Home / Self Care | Payer: Medicare Other | Attending: Emergency Medicine | Admitting: Emergency Medicine

## 2016-07-25 ENCOUNTER — Emergency Department (HOSPITAL_COMMUNITY): Payer: Medicare Other

## 2016-07-25 ENCOUNTER — Other Ambulatory Visit: Payer: Self-pay

## 2016-07-25 ENCOUNTER — Ambulatory Visit (HOSPITAL_BASED_OUTPATIENT_CLINIC_OR_DEPARTMENT_OTHER): Payer: Medicare Other | Admitting: Oncology

## 2016-07-25 ENCOUNTER — Other Ambulatory Visit (HOSPITAL_BASED_OUTPATIENT_CLINIC_OR_DEPARTMENT_OTHER): Payer: Medicare Other

## 2016-07-25 ENCOUNTER — Ambulatory Visit: Payer: Medicare Other | Admitting: Radiation Oncology

## 2016-07-25 VITALS — BP 105/70 | HR 124 | Temp 98.3°F | Resp 18 | Ht 62.0 in | Wt 163.5 lb

## 2016-07-25 DIAGNOSIS — R0602 Shortness of breath: Secondary | ICD-10-CM | POA: Diagnosis present

## 2016-07-25 DIAGNOSIS — N183 Chronic kidney disease, stage 3 (moderate): Secondary | ICD-10-CM | POA: Diagnosis not present

## 2016-07-25 DIAGNOSIS — I2510 Atherosclerotic heart disease of native coronary artery without angina pectoris: Secondary | ICD-10-CM | POA: Insufficient documentation

## 2016-07-25 DIAGNOSIS — C7802 Secondary malignant neoplasm of left lung: Secondary | ICD-10-CM

## 2016-07-25 DIAGNOSIS — C7951 Secondary malignant neoplasm of bone: Secondary | ICD-10-CM

## 2016-07-25 DIAGNOSIS — I4891 Unspecified atrial fibrillation: Secondary | ICD-10-CM | POA: Insufficient documentation

## 2016-07-25 DIAGNOSIS — T451X5A Adverse effect of antineoplastic and immunosuppressive drugs, initial encounter: Secondary | ICD-10-CM

## 2016-07-25 DIAGNOSIS — D701 Agranulocytosis secondary to cancer chemotherapy: Secondary | ICD-10-CM

## 2016-07-25 DIAGNOSIS — Z7901 Long term (current) use of anticoagulants: Secondary | ICD-10-CM | POA: Diagnosis not present

## 2016-07-25 DIAGNOSIS — Z9181 History of falling: Secondary | ICD-10-CM

## 2016-07-25 DIAGNOSIS — C50911 Malignant neoplasm of unspecified site of right female breast: Secondary | ICD-10-CM

## 2016-07-25 DIAGNOSIS — C7801 Secondary malignant neoplasm of right lung: Secondary | ICD-10-CM | POA: Diagnosis not present

## 2016-07-25 DIAGNOSIS — I5031 Acute diastolic (congestive) heart failure: Secondary | ICD-10-CM | POA: Insufficient documentation

## 2016-07-25 DIAGNOSIS — Z853 Personal history of malignant neoplasm of breast: Secondary | ICD-10-CM | POA: Diagnosis not present

## 2016-07-25 DIAGNOSIS — I130 Hypertensive heart and chronic kidney disease with heart failure and stage 1 through stage 4 chronic kidney disease, or unspecified chronic kidney disease: Secondary | ICD-10-CM | POA: Insufficient documentation

## 2016-07-25 DIAGNOSIS — Z87891 Personal history of nicotine dependence: Secondary | ICD-10-CM | POA: Diagnosis not present

## 2016-07-25 LAB — COMPREHENSIVE METABOLIC PANEL
ALBUMIN: 3.1 g/dL — AB (ref 3.5–5.0)
ALK PHOS: 87 U/L (ref 40–150)
ALT: 29 U/L (ref 0–55)
AST: 23 U/L (ref 5–34)
Anion Gap: 10 mEq/L (ref 3–11)
BUN: 25.1 mg/dL (ref 7.0–26.0)
CALCIUM: 10.2 mg/dL (ref 8.4–10.4)
CO2: 16 mEq/L — ABNORMAL LOW (ref 22–29)
CREATININE: 1.2 mg/dL — AB (ref 0.6–1.1)
Chloride: 108 mEq/L (ref 98–109)
EGFR: 44 mL/min/{1.73_m2} — ABNORMAL LOW (ref >90)
GLUCOSE: 219 mg/dL — AB (ref 70–140)
Potassium: 4.3 mEq/L (ref 3.5–5.1)
Sodium: 135 mEq/L — ABNORMAL LOW (ref 136–145)
Total Bilirubin: 0.56 mg/dL (ref 0.20–1.20)
Total Protein: 6.3 g/dL — ABNORMAL LOW (ref 6.4–8.3)

## 2016-07-25 LAB — CBC WITH DIFFERENTIAL/PLATELET
BASO%: 0 % (ref 0.0–2.0)
Basophils Absolute: 0 10*3/uL (ref 0.0–0.1)
Basophils Absolute: 0 10*3/uL (ref 0.0–0.1)
Basophils Relative: 0 %
EOS PCT: 0 %
EOS%: 0 % (ref 0.0–7.0)
Eosinophils Absolute: 0 10*3/uL (ref 0.0–0.5)
Eosinophils Absolute: 0 10*3/uL (ref 0.0–0.7)
HEMATOCRIT: 33 % — AB (ref 34.8–46.6)
HEMATOCRIT: 33.8 % — AB (ref 36.0–46.0)
HEMOGLOBIN: 11.3 g/dL — AB (ref 11.6–15.9)
HEMOGLOBIN: 11.6 g/dL — AB (ref 12.0–15.0)
LYMPH#: 0.4 10*3/uL — AB (ref 0.9–3.3)
LYMPH%: 29.4 % (ref 14.0–49.7)
LYMPHS ABS: 0.4 10*3/uL — AB (ref 0.7–4.0)
LYMPHS PCT: 22 %
MCH: 30.2 pg (ref 25.1–34.0)
MCH: 30.4 pg (ref 26.0–34.0)
MCHC: 34.2 g/dL (ref 31.5–36.0)
MCHC: 34.3 g/dL (ref 30.0–36.0)
MCV: 88.2 fL (ref 79.5–101.0)
MCV: 88.7 fL (ref 78.0–100.0)
MONO#: 0.1 10*3/uL (ref 0.1–0.9)
MONO%: 4 % (ref 0.0–14.0)
Monocytes Absolute: 0.1 10*3/uL (ref 0.1–1.0)
Monocytes Relative: 5 %
NEUT#: 0.8 10*3/uL — ABNORMAL LOW (ref 1.5–6.5)
NEUT%: 66.6 % (ref 38.4–76.8)
NEUTROS PCT: 73 %
NRBC: 1 % — AB (ref 0–0)
Neutro Abs: 1.4 10*3/uL — ABNORMAL LOW (ref 1.7–7.7)
Platelets: 290 10*3/uL (ref 145–400)
Platelets: 300 10*3/uL (ref 150–400)
RBC: 3.74 10*6/uL (ref 3.70–5.45)
RBC: 3.81 MIL/uL — ABNORMAL LOW (ref 3.87–5.11)
RDW: 15.6 % — AB (ref 11.2–14.5)
RDW: 15.3 % (ref 11.5–15.5)
WBC: 1.3 10*3/uL — AB (ref 3.9–10.3)
WBC: 1.9 10*3/uL — ABNORMAL LOW (ref 4.0–10.5)

## 2016-07-25 LAB — BASIC METABOLIC PANEL
Anion gap: 9 (ref 5–15)
BUN: 23 mg/dL — AB (ref 6–20)
CHLORIDE: 106 mmol/L (ref 101–111)
CO2: 19 mmol/L — AB (ref 22–32)
Calcium: 10 mg/dL (ref 8.9–10.3)
Creatinine, Ser: 1.13 mg/dL — ABNORMAL HIGH (ref 0.44–1.00)
GFR calc Af Amer: 54 mL/min — ABNORMAL LOW (ref >60)
GFR calc non Af Amer: 47 mL/min — ABNORMAL LOW (ref >60)
GLUCOSE: 140 mg/dL — AB (ref 65–99)
Potassium: 4.4 mmol/L (ref 3.5–5.1)
Sodium: 134 mmol/L — ABNORMAL LOW (ref 135–145)

## 2016-07-25 LAB — BRAIN NATRIURETIC PEPTIDE: B Natriuretic Peptide: 1119.9 pg/mL — ABNORMAL HIGH (ref 0.0–100.0)

## 2016-07-25 LAB — I-STAT TROPONIN, ED: Troponin i, poc: 0.01 ng/mL (ref 0.00–0.08)

## 2016-07-25 MED ORDER — TBO-FILGRASTIM 300 MCG/0.5ML ~~LOC~~ SOSY
300.0000 ug | PREFILLED_SYRINGE | Freq: Once | SUBCUTANEOUS | Status: AC
  Administered 2016-07-25: 300 ug via SUBCUTANEOUS
  Filled 2016-07-25: qty 0.5

## 2016-07-25 MED ORDER — ETOMIDATE 2 MG/ML IV SOLN
INTRAVENOUS | Status: AC | PRN
  Administered 2016-07-25: 12 mg via INTRAVENOUS

## 2016-07-25 MED ORDER — DILTIAZEM HCL 25 MG/5ML IV SOLN
20.0000 mg | Freq: Once | INTRAVENOUS | Status: AC
  Administered 2016-07-25: 20 mg via INTRAVENOUS
  Filled 2016-07-25: qty 5

## 2016-07-25 MED ORDER — SODIUM CHLORIDE 0.9 % IV BOLUS (SEPSIS)
1000.0000 mL | Freq: Once | INTRAVENOUS | Status: AC
  Administered 2016-07-25: 1000 mL via INTRAVENOUS

## 2016-07-25 MED ORDER — ETOMIDATE 2 MG/ML IV SOLN
15.0000 mg | Freq: Once | INTRAVENOUS | Status: DC
  Filled 2016-07-25: qty 10

## 2016-07-25 NOTE — ED Provider Notes (Signed)
[facility] DEPT Provider Note   CSN: [id number] Arrival date & time: 07/25/16  1409     History   Chief Complaint Chief Complaint  Patient presents with  . Atrial Fibrillation    HPI Lisa [name] is a 76 y.o. female.  The history is provided by the patient.  Shortness of Breath  This is a recurrent problem. The average episode lasts 3 days. The problem occurs intermittently.Episode onset: 3d. The problem has not changed since onset.Associated symptoms include sore throat (improved). Pertinent negatives include no fever, no coryza, no cough, no sputum production, no chest pain, no syncope, no vomiting, no abdominal pain and no leg swelling (mild R foot swelling, baseline).     Past Medical History:  Diagnosis Date  . Breast cancer ([place]) 07-13-1997   right  . Chronic back pain   . CKD (chronic kidney disease), stage III   . Complication of anesthesia   . Coronary artery calcification of native artery    a. seen on PET scan 04/2016.  [name] Hypertension   . Pericardial effusion    a. small by TEE 05/2016.  [name] Persistent atrial fibrillation ([place])   . Pleural effusion 11-08-2008  . PONV (postoperative nausea and vomiting)   . Radiation 08/17/2015-08/29/2015   lower thoracic spine 22.5 gray  . Radiation 01/17/16-01/24/16   right femur 20Gy    Patient Active Problem List   Diagnosis Date Noted  . Chemotherapy induced neutropenia ([place]) 07/06/2016  . Leukopenia due to antineoplastic chemotherapy ([place]) 06/29/2016  . CKD (chronic kidney disease) stage 3, GFR 30-59 ml/min 06/14/2016  . Chronic anticoagulation 06/14/2016  . Pericardial effusion - small by TEE 05/27/16 05/28/2016  . Pleural effusion   . Paroxysmal atrial fibrillation (HCC)   . Acute diastolic heart failure ([place])   . Shortness of breath   . Atrial fibrillation with RVR ([place]) 05/22/2016  . Genetic testing 05/19/2016  . 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 Breast cancer metastasized to bone, right ([place]) 05/07/2016  . Breast cancer metastasized to multiple sites, right ([place]) 05/07/2016  . Encounter for antineoplastic chemotherapy 03/06/2016  . Breast cancer metastasized to multiple sites ([place]) 12/09/2015  . Urinary frequency 11/10/2015  . High risk medication use 11/10/2015  . Postoperative anemia due to acute blood loss 09/14/2015  . Metastatic breast cancer ([place]) 09/01/2015  . Cord compression ([place]) 08/31/2015  . Pathologic compression fracture of thoracic vertebra ([place]) 08/31/2015  . Pathologic fracture of thoracic vertebrae 08/26/2015  . Dehydration 08/12/2015  . C. difficile colitis 08/12/2015  . Acute kidney injury superimposed on CKD ([place]) 08/12/2015  . Unintentional weight loss 08/12/2015  . Cancer associated pain 08/12/2015  . Hypokalemia 08/08/2015  . Carcinoma of breast metastatic to bone ([place]) 08/07/2015  . Enteritis due to Clostridium difficile 08/06/2015  . Sepsis ([place]) 08/02/2015  . Diarrhea 08/02/2015  . AKI (acute kidney injury) ([place]) 08/02/2015  . Hypercalcemia 08/02/2015  . Acute kidney injury ([place])   . Bone metastases ([place])   . 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 PLEURAL EFFUSION 10/13/2008  . ACUTE BRONCHOSPASM 06/27/2008  . Disorder of bone and cartilage 02/03/2008  . UNS ADVRS EFF UNS RX MEDICINAL&BIOLOGICAL SBSTNC 01/04/2008  . HLD (hyperlipidemia) 07/24/2007  . Essential hypertension 07/24/2007  . Allergic rhinitis 07/24/2007  . BREAST CANCER, HX OF 07/24/2007    Past Surgical History:  Procedure Laterality Date  . ABDOMINAL  HYSTERECTOMY  02-21-1982  . BREAST BIOPSY  07-13-1997  . CARDIOVERSION N/A 05/27/2016   Procedure: CARDIOVERSION;  Surgeon: [name], MD;  Location: [facility];  Service: Cardiovascular;  Laterality: N/A;  . DILATION AND CURETTAGE, DIAGNOSTIC / THERAPEUTIC  12-13-1981  . MASTECTOMY  07-29-1997  . PELVIC  LAPAROSCOPY  11-06-1981  . POSTERIOR LUMBAR FUSION 4 LEVEL N/A 09/01/2015   Procedure: Thoracic six-Lumbar one fusion with arthrodesis pedicle screw stabilization and decompression;  Surgeon: Ashok Pall, MD;  Location: [facility];  Service: Neurosurgery;  Laterality: N/A;  T6-L1 fusion with arthrodesis pedicle screw stabilization and decompression  . TEE WITHOUT CARDIOVERSION N/A 05/27/2016   Procedure: TRANSESOPHAGEAL ECHOCARDIOGRAM (TEE);  Surgeon: [name], MD;  Location: [facility] ENDOSCOPY;  Service: Cardiovascular;  Laterality: N/A;  . THORACENTESIS  10-18-2008    OB History    No data available       Home Medications    Prior to Admission medications   Medication Sig Start Date End Date Taking? Authorizing Provider  acetaminophen (TYLENOL) 500 MG tablet Take 500 mg by mouth every 6 (six) hours as needed for moderate pain.   Yes Historical Provider, MD  amiodarone (PACERONE) 200 MG tablet Take 1 tablet (200 mg total) by mouth daily. Patient taking differently: Take 100 mg by mouth daily.  07/05/16  Yes Sherran Needs, NP  apixaban (ELIQUIS) 5 MG TABS tablet Take 1 tablet (5 mg total) by mouth 2 (two) times daily. MUST KEEP APPOINTMENT 07/29/2016 WITH DR Sallyanne Kuster FOR FUTURE REFILLS 06/26/16  Yes Mihai Croitoru, MD  ciprofloxacin (CIPRO) 500 MG tablet Take 500 mg by mouth 2 (two) times daily.  07/21/16  Yes Historical Provider, MD  dexamethasone (DECADRON) 4 MG tablet Take 4 tabs (16 mg) 12 hours before taxol with food 07/12/16  Yes Lennis P Livesay, MD  fluticasone (FLONASE) 50 MCG/ACT nasal spray Place 1 spray into both nostrils 2 (two) times daily. Patient taking differently: Place 1 spray into both nostrils 2 (two) times daily as needed for allergies.  05/31/16  Yes Gerber, DO  furosemide (LASIX) 20 MG tablet Take 1 tablet (20 mg total) by mouth daily. 06/17/16  Yes Sherran Needs, NP  loratadine (CLARITIN) 10 MG tablet Take 10 mg by mouth daily.    Yes Historical  Provider, MD  metoprolol tartrate (LOPRESSOR) 25 MG tablet Take 1 tablet (25 mg total) by mouth 2 (two) times daily. MUST KEEP APPOINTMENT 07/29/2016 WITH DR Sallyanne Kuster FOR FUTURE REFILLS 06/26/16  Yes Mihai Croitoru, MD  ondansetron (ZOFRAN) 8 MG tablet Take 1 tablet (8 mg total) by mouth every 8 (eight) hours as needed for nausea or vomiting. 01/08/16  Yes Lennis P Livesay, MD  potassium chloride (K-DUR) 10 MEQ tablet TAKE 1 TABLET BY MOUTH ONCE DAILY OR AS DIRECTED 07/05/16  Yes Lennis P Livesay, MD  simvastatin (ZOCOR) 20 MG tablet Take 20 mg by mouth at bedtime.     Yes Historical Provider, MD  traMADol (ULTRAM) 50 MG tablet Take 50-100 mg by mouth every 8 (eight) hours as needed for moderate pain.   Yes Historical Provider, MD  vitamin B-12 (CYANOCOBALAMIN) 1000 MCG tablet Take 1,000 mcg by mouth 2 (two) times daily.   Yes Historical Provider, MD  vitamin C (ASCORBIC ACID) 500 MG tablet Take 500 mg by mouth daily.     Yes Historical Provider, MD  vitamin E 200 UNIT capsule Take 200 Units by mouth daily.     Yes Historical Provider, MD  Family History Family History  Problem Relation Age of Onset  . Heart disease Mother     with pacemaker   . Asthma Father   . Heart attack Maternal Grandfather 64  . Diabetes Paternal Grandmother   . Aortic stenosis Maternal Uncle   . Prostate cancer Maternal Uncle     dx unspecified age  . Prostate cancer Maternal Uncle     dx older than 50y  . Prostate cancer Maternal Uncle     dx older than 50y  . Prostate cancer Maternal Uncle 72    s/p prostatectomy  . Breast cancer Cousin 69    maternal 1st cousin; s/p lumpectomy and radiation  . Breast cancer Cousin     maternal 1st cousin dx mid-late 44s; s/p mastectomy    Social History Social History  Substance Use Topics  . Smoking status: Former Smoker    Packs/day: 0.50    Years: 30.00    Types: Cigarettes    Quit date: 07/22/1996  . Smokeless tobacco: Never Used     Comment: 1/2 up to 1 ppd  .  Alcohol use No     Comment: hx of alcohol socially - hasn't had drink since March 1998     Allergies   Diphenhydramine hcl; Codeine sulfate; Oseltamivir phosphate; Oxycodone-acetaminophen; Prednisone; and Sudafed [pseudoephedrine hcl]   Review of Systems Review of Systems  Constitutional: Positive for fatigue. Negative for fever.  HENT: Positive for sore throat (improved). Negative for congestion.   Respiratory: Positive for shortness of breath. Negative for cough and sputum production.   Cardiovascular: Negative for chest pain, palpitations, leg swelling (mild R foot swelling, baseline) and syncope.  Gastrointestinal: Negative for abdominal pain, diarrhea, nausea and vomiting.  Neurological: Negative for dizziness, syncope and light-headedness.  All other systems reviewed and are negative.    Physical Exam Updated Vital Signs BP 94/75   Pulse (!) 130   Resp 25   Ht 5\' 2"  (1.575 m)   Wt 73.9 kg   SpO2 99%   BMI 29.81 kg/m   Physical Exam  Constitutional: She appears well-developed and well-nourished. No distress.  HENT:  Head: Normocephalic and atraumatic.  Eyes: Conjunctivae are normal.  Neck: Neck supple.  Cardiovascular: Intact distal pulses.  An irregularly irregular rhythm present. Tachycardia present.   No murmur heard. Pulmonary/Chest: Effort normal and breath sounds normal. No respiratory distress. She has no wheezes. She has no rales.  Abdominal: Soft. There is no tenderness.  Musculoskeletal: She exhibits no edema (trace pedal edema).  Neurological: She is alert.  Skin: Skin is warm and dry. She is not diaphoretic.  Psychiatric: She has a normal mood and affect.  Nursing note and vitals reviewed.    ED Treatments / Results  Labs (all labs ordered are listed, but only abnormal results are displayed) Labs Reviewed  CBC WITH DIFFERENTIAL/PLATELET - Abnormal; Notable for the following:       Result Value   WBC 1.9 (*)    RBC 3.81 (*)    Hemoglobin 11.6  (*)    HCT 33.8 (*)    Neutro Abs 1.4 (*)    Lymphs Abs 0.4 (*)    All other components within normal limits  BASIC METABOLIC PANEL - Abnormal; Notable for the following:    Sodium 134 (*)    CO2 19 (*)    Glucose, Bld 140 (*)    BUN 23 (*)    Creatinine, Ser 1.13 (*)    GFR calc non Af Wyvonnia Lora  47 (*)    GFR calc Af Amer 54 (*)    All other components within normal limits  BRAIN NATRIURETIC PEPTIDE - Abnormal; Notable for the following:    B Natriuretic Peptide 1,119.9 (*)    All other components within normal limits  I-STAT TROPOININ, ED    EKG  EKG Interpretation None       Radiology Dg Chest 2 View  Result Date: 07/25/2016 CLINICAL DATA:  Atrial fibrillation. EXAM: CHEST  2 VIEW COMPARISON:  06/14/2016 . FINDINGS: Mediastinum hilar structures normal. Mild bilateral from interstitial prominence with small bilateral pleural effusions noted. Findings consistent mild congestive heart failure with mild interstitial edema. Findings less pronounced than on prior study of 06/14/2016 . Prior thoracolumbar spine fusion. Surgical clips right axilla . IMPRESSION: Mild congestive heart failure mild interstitial edema and small pleural effusions. Electronically Signed   By: Marcello Moores  Register   On: 07/25/2016 15:29    Procedures .Sedation Date/Time: 07/25/2016 4:47 PM Performed by: Tobie Poet Authorized by: Jola Schmidt   Consent:    Consent obtained:  Written and verbal   Consent given by:  Patient Indications:    Procedure performed:  Cardioversion   Procedure necessitating sedation performed by:  Physician performing sedation   Intended level of sedation:  Moderate (conscious sedation) Pre-sedation assessment:    Time since last food or drink:  7am   ASA classification: class 3 - patient with severe systemic disease     Pre-sedation assessments completed and reviewed: airway patency and mental status   Immediate pre-procedure details:    Reassessment: Patient reassessed  immediately prior to procedure     Reviewed: vital signs, relevant labs/tests and NPO status     Verified: bag valve mask available, oxygen available and suction available   Post-procedure details:    Patient tolerance:  Tolerated well, no immediate complications .Cardioversion Date/Time: 07/25/2016 4:48 PM Performed by: Tobie Poet Authorized by: Jola Schmidt   Consent:    Consent obtained:  Written and verbal   Consent given by:  Patient Pre-procedure details:    Cardioversion basis:  Elective   Rhythm:  Atrial fibrillation   Electrode placement:  Anterior-posterior Attempt one:    Cardioversion mode:  Synchronous   Shock (Joules):  200   Shock outcome:  Conversion to normal sinus rhythm Post-procedure details:    Patient tolerance of procedure:  Tolerated well, no immediate complications   (including critical care time)  Medications Ordered in ED Medications  etomidate (AMIDATE) injection 15 mg (not administered)  sodium chloride 0.9 % bolus 1,000 mL (0 mLs Intravenous Stopped 07/25/16 1853)  diltiazem (CARDIZEM) injection 20 mg (20 mg Intravenous Given 07/25/16 1634)  etomidate (AMIDATE) injection (12 mg Intravenous Given 07/25/16 1705)     Initial Impression / Assessment and Plan / ED Course  I have reviewed the triage vital signs and the nursing notes.  Pertinent labs & imaging results that were available during my care of the patient were reviewed by me and considered in my medical decision making (see chart for details).  Clinical Course    Patient is a 76 year old female with history of metastatic breast cancer, atrial fibrillation, anticoagulated on Eliquis who presents with few days of dyspnea on exertion. At follow-up today in oncology clinic patient found to be in A. fib with RVR. Patient was sent to the emergency department for further evaluation.   Patient is compliant with Eliquis and is a good candidate for cardioversion. Patient is in A. fib with heart  rate in  the 130s in the ED. Patient denies any current chest pain, shortness of breath, lightheadedness, palpitations.  Patient's potassium is within normal limits. BNP 1100 which is elevated from prior. There is mild interstitial edema on chest x-ray. Clinically no signs of fluid overload or SOB symptoms currently. Diltiazem IV given without significant improvement in rate or rhythm.   Patient was cardioverted successfully under conscious sedation. Repeat EKG shows sinus rhythm. Patient monitored for 1 hour and remains in sinus rhythm. Pt clear for discharge home and f/u with cardiology on 1/8. Strict return precautions given.  Pt seen with attending Dr. Venora Maples.    Final Clinical Impressions(s) / ED Diagnoses   Final diagnoses:  Atrial fibrillation with RVR [facility])    New Prescriptions New Prescriptions   No medications on file     Tobie Poet, DO 07/25/16 [place], MD 07/26/16 [phone]

## 2016-07-25 NOTE — ED Notes (Signed)
Patient verbalized understanding of discharge instructions and denies any further needs or questions at this time. VS stable. Patient ambulatory with steady gait. Escorted both patient and her mother to ED entrance in wheelchair.

## 2016-07-25 NOTE — ED Notes (Signed)
Patient transported to X-ray 

## 2016-07-25 NOTE — Progress Notes (Signed)
OFFICE PROGRESS NOTE   July 25, 2016   Physicians: J.Kinard, [name] (PCP), (R.Sharma), A.Kendall. K.Cabbell.,M.Croitoru  INTERVAL HISTORY:  Patient is seen, together with mother and uncle, in continuing attention to metastatic breast cancer for which she is on treatment with weekly taxol and with xgeva. She had day 15 cycle 2 taxol on 07-18-16.  She is neutropenic with ANC 0.8 today, but not febrile, granix given. Chemo and Xgeva held today. She is back in atrial fib/ flutter with HR 120 by EKG now,BP is lower than baseline at 105/70. She feels weaker, no chest pain or palpitations. I have spoken with Dr Stanford Breed , who would like her to go to [facility] ED for possible cardioversion. I have spoken with [facility] ED re cardiology concerns and neutropenia.  She has fallen twice at home, 12-31 and 1-1, without injury "knees gave out when I pivoted", may have caught feet on carpet. She uses walker when out of house. No HA, no other neurologic symptoms She is improving from recent respiratory infection, on cipro since 07-20-16, which she will DC after today.  Respiratory infection began with scratchy throat, then cough productive of white to slightly yellow sputum, small amounts, no coughing today. She is SOB with exertion, not clear if this is respiratory cause. Wheezing not increased. She has been eating and drinking fluids. No fever. No new or different pain including no increase in residual back "fatigue" when she is up for extended times. No LE swelling since on lasix. No diarrhea (hx C diff last year, note on cipro). No bleeding. No peripheral neuropathy Remainder of 14 point Review of Systems negative.  No central catheter Flu vaccine 07-09-16 No genetics testing Eliquis for A fib  Note right axillary nodes x 12 1998 (no radiation right breast/ axilla)  ONCOLOGIC HISTORY Right breast carcinoma T2 N1, ER/PR-positive December 1998, treated with mastectomy with 12 node axillary evaluation and  adjuvant Adriamycin/Cytoxan followed by 5 years of tamoxifen through December 2003. 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 CA 2729 was 434 in April 2010. She has tolerated Femara well and clinically has continued to do very well, though CA 27.29 has never normalized. 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 Patient seen by medical oncology 08-10-15 and radiation begun shortly afterwards by Dr Sondra Come, 22.5 of planned 35 Gy radiation to area of T10 thru 08-29-15. She had first zometa 08-11-15. MRI T spine obtained after some difficulty on 08-29-15, with pathologic compression fracture T10 with retropulsion of compressed vertebra to right with compression of cord called, involvement also T9, T11, L2. She had thoracic six- lumbar one posterolateral arthrodesis, T10 laminectomy, partial laminectomy T9, T11 by Dr Christella Noa on 09-01-15. She was begun on Aromasin on 09-07-15.  PET had been scheduled for 09-04-15, delayed with surgery, accomplished on 09-20-15 with chest adenopathy, increased soft tissue right lung base, extensive bone involvement.  Repeat PET 12-22-15 some improvement in area of radiation T spine, otherwise no improvement in bones, some increase right femur, some central chest adenopathy and right lower chest/ pleural involvement. Aromasin was discontinued and xeloda begun 01-05-16, used thru 04-29-16.  BRCA negative by [facility] 28 gene panel by [facility] 03-2016. Progressed in lung and bones by PET 05-02-16. Weekly taxol begun10-19-17, tho interrupted due to new onset A fib early 05-2016. Zometa used initially,  changed to xgeva 06-27-16.  Radiation to right pelvis / hip ~ 05-21-16 thru 06-06-16, 28 Gy.   Objective:  Vital signs in last 24 hours:  BP 105/70 (BP Location: Left Arm, Patient Position: Sitting)    Pulse (!) 124   Temp 98.3 F (36.8 C) (Oral)   Resp 18   Ht '5\' 2"'$  (1.575 m)   Wt 163 lb 8 oz (74.2 kg)   SpO2 99%   BMI 29.90 kg/m  Weight down 1/2 lb.  Alert, oriented and appropriate, appears generally more weak.. Ambulatory without assistance in exam room, used WC for distances in office.  Alopecia  HEENT:PERRL, sclerae not icteric. Oral mucosa moist without lesions, posterior pharynx minimal dull erythema Neck supple. No JVD  Lymphatics:no supraclavicular adenopathy Resp: clear to auscultation bilaterally and normal percussion bilaterally Cardio: tachycardic, not irregular that I can tell on exam. No gallop. GI: soft, nontender, not distended. Normally active bowel sounds.  Musculoskeletal/ Extremities: LE / UEwithout pitting edema, cords, tenderness Neuro: no peripheral neuropathy. Otherwise nonfocal. Mood and affect appropriate Skin without rash, ecchymosis, petechiae   Lab Results:  Results for orders placed or performed in visit on 07/25/16  CBC with Differential  Result Value Ref Range   WBC 1.3 (L) 3.9 - 10.3 10e3/uL   NEUT# 0.8 (L) 1.5 - 6.5 10e3/uL   HGB 11.3 (L) 11.6 - 15.9 g/dL   HCT 33.0 (L) 34.8 - 46.6 %   Platelets 290 145 - 400 10e3/uL   MCV 88.2 79.5 - 101.0 fL   MCH 30.2 25.1 - 34.0 pg   MCHC 34.2 31.5 - 36.0 g/dL   RBC 3.74 3.70 - 5.45 10e6/uL   RDW 15.6 (H) 11.2 - 14.5 %   lymph# 0.4 (L) 0.9 - 3.3 10e3/uL   MONO# 0.1 0.1 - 0.9 10e3/uL   Eosinophils Absolute 0.0 0.0 - 0.5 10e3/uL   Basophils Absolute 0.0 0.0 - 0.1 10e3/uL   NEUT% 66.6 38.4 - 76.8 %   LYMPH% 29.4 14.0 - 49.7 %   MONO% 4.0 0.0 - 14.0 %   EOS% 0.0 0.0 - 7.0 %   BASO% 0.0 0.0 - 2.0 %   nRBC 1 (H) 0 - 0 %  Comprehensive metabolic panel  Result Value Ref Range   Sodium 135 (L) 136 - 145 mEq/L   Potassium 4.3 3.5 - 5.1 mEq/L   Chloride 108 98 - 109 mEq/L   CO2 16 (L) 22 - 29 mEq/L   Glucose 219 (H) 70 - 140 mg/dl   BUN 25.1 7.0 - 26.0 mg/dL   Creatinine 1.2 (H) 0.6 - 1.1 mg/dL    Total Bilirubin 0.56 0.20 - 1.20 mg/dL   Alkaline Phosphatase 87 40 - 150 U/L   AST 23 5 - 34 U/L   ALT 29 0 - 55 U/L   Total Protein 6.3 (L) 6.4 - 8.3 g/dL   Albumin 3.1 (L) 3.5 - 5.0 g/dL   Calcium 10.2 8.4 - 10.4 mg/dL   Anion Gap 10 3 - 11 mEq/L   EGFR 44 (L) >90 ml/min/1.73 m2     Studies/Results: EKG now atrial fib or flutter at 120  Medications: I have reviewed the patient's current medications. She has not missed any doses of Eliquis Hold taxol and xgeva with neutropenia and acute cardiac problems  Granix today and possibly 07-26-16  DISCUSSION Back in A fib/ flutter with RVR. Will go to [facility] ED for possible cardioversion.   Chemo neutropenia: granix now. [facility] ED triage notified  of neutropenia, patient given mask. Neutropenic precautions reviewed. If not hospitalized, she is to come to [facility] for CBC and possible granix again on 07-26-16.   Will continue taxol and xgeva next week if counts recovered and otherwise stable.   Falls at home: if hospitalized for cardiac problems should get PT input. Arrhythmia likely increasing weakness, does not suggest cord compression. She / mother need to call for assistance if falls.   Respiratory infection: resolving but is neutropenic. Stop cipro after this AM dose.  Assessment/Plan:  1. Metastatic breast cancer tolungs and bone: Clinically improving onweekly taxol beginning 05-09-16, several delays including for counts. Hold today with neutropenia, granix. Continue xgeva monthly, tho held today with other problems. 2.Recurrent atrial fib / flutter with rapid ventricular rate: recurrent, moderately symptomatic. Will send to [facility] ED per cardiology. On Eliquis, amiodarone, metoprolol since previous cardioversion 05-2016, and recent addition of lasix for CHF symptoms. Has appointment with Dr Sallyanne Kuster 07-29-16 3.Pathologic compression fracture T10 and retropulsion of bone impinging on spinal cord for which she had T6 - L1 laminectomy by Dr Christella Noa  09-01-15. Radiation to right iliac area 05-2016 3. Neutropenia from chemo: granix today, repeat counts 1-5/ possible additional granix then 4.Mild hypercalcemia resolved, follow, continue xgeva (previously zometa). 5.Respiratory infection improving, tho neutropenic. Stop cipro particularly with past C diff. 6.remote past tobacco, none since 1998 7.social situation: patient is primary caregiver for 63 yo mother, who was recently hospitalized for CHF 8.Advance Directives in place .9. Falls at home x 2 this week, without injury. Likely multifactorial. Will see if home PT evaluation is possible 10. CKD III. Creatinine stable today 11.environmental allergies: had been on allergy injections x years previously 12.flu vaccine 07-09-16  All questions answered and patient/ family understand plans as above. Time spent 40 min including >50% counseling and coordination of care. Cc Dr Philip Aspen, Dr Sallyanne Kuster    Evlyn Clines, MD   07/25/2016, 1:38 PM

## 2016-07-25 NOTE — ED Triage Notes (Signed)
Pt in from Oncology appointment with c/o Afib, noticed at their office. Pt has hx of Afib (takes Metoprolol, Amiodarone), hx of carcinoma and is currently receiving chemo trx. Pt went to appt this am, did not have chemo d/t WBC being 0.8. Pt reports "flutters and sob" today, came straight from office. HR in 110-140's.

## 2016-07-26 ENCOUNTER — Ambulatory Visit: Payer: Medicare Other

## 2016-07-26 ENCOUNTER — Other Ambulatory Visit (HOSPITAL_BASED_OUTPATIENT_CLINIC_OR_DEPARTMENT_OTHER): Payer: Medicare Other

## 2016-07-26 DIAGNOSIS — C50911 Malignant neoplasm of unspecified site of right female breast: Secondary | ICD-10-CM

## 2016-07-26 DIAGNOSIS — D701 Agranulocytosis secondary to cancer chemotherapy: Secondary | ICD-10-CM

## 2016-07-26 DIAGNOSIS — T451X5A Adverse effect of antineoplastic and immunosuppressive drugs, initial encounter: Principal | ICD-10-CM

## 2016-07-26 DIAGNOSIS — C50919 Malignant neoplasm of unspecified site of unspecified female breast: Secondary | ICD-10-CM

## 2016-07-26 DIAGNOSIS — C7951 Secondary malignant neoplasm of bone: Secondary | ICD-10-CM

## 2016-07-26 LAB — CBC WITH DIFFERENTIAL/PLATELET
BASO%: 0.2 % (ref 0.0–2.0)
Basophils Absolute: 0 10*3/uL (ref 0.0–0.1)
EOS%: 0.1 % (ref 0.0–7.0)
Eosinophils Absolute: 0 10*3/uL (ref 0.0–0.5)
HCT: 32.3 % — ABNORMAL LOW (ref 34.8–46.6)
HGB: 10.8 g/dL — ABNORMAL LOW (ref 11.6–15.9)
LYMPH%: 7.2 % — AB (ref 14.0–49.7)
MCH: 30.3 pg (ref 25.1–34.0)
MCHC: 33.4 g/dL (ref 31.5–36.0)
MCV: 90.5 fL (ref 79.5–101.0)
MONO#: 2 10*3/uL — ABNORMAL HIGH (ref 0.1–0.9)
MONO%: 19.7 % — AB (ref 0.0–14.0)
NEUT%: 72.8 % (ref 38.4–76.8)
NEUTROS ABS: 7.5 10*3/uL — AB (ref 1.5–6.5)
Platelets: 314 10*3/uL (ref 145–400)
RBC: 3.57 10*6/uL — ABNORMAL LOW (ref 3.70–5.45)
RDW: 16.1 % — AB (ref 11.2–14.5)
WBC: 10.3 10*3/uL (ref 3.9–10.3)
lymph#: 0.7 10*3/uL — ABNORMAL LOW (ref 0.9–3.3)
nRBC: 1 % — ABNORMAL HIGH (ref 0–0)

## 2016-07-26 MED ORDER — TBO-FILGRASTIM 300 MCG/0.5ML ~~LOC~~ SOSY
300.0000 ug | PREFILLED_SYRINGE | Freq: Once | SUBCUTANEOUS | Status: DC
  Filled 2016-07-26: qty 0.5

## 2016-07-26 MED FILL — OSELTAMIVIR PHOS 75 MG CAP: 75 | 10 days supply | Qty: 10 | Fill #0

## 2016-07-26 MED FILL — BENZONATATE 100 MG CAPSULE: 100 | 30 days supply | Qty: 90 | Fill #0

## 2016-07-26 MED FILL — ONDANSETRON HCL 8 MG TABLET: 8 | 10 days supply | Qty: 10 | Fill #0

## 2016-07-26 NOTE — Progress Notes (Signed)
Labs today are wbc of 10.3, neutrophils noted at 7.5, discussed results with Dr. Marko Plume. No injection needed today per Dr. Marko Plume. Pt given copy of schedule and instructed to call office should issues occur. Pt verbalized understamding og instructions.

## 2016-07-26 NOTE — Patient Instructions (Signed)

## 2016-07-27 ENCOUNTER — Encounter: Payer: Self-pay | Admitting: Oncology

## 2016-07-27 DIAGNOSIS — Z9181 History of falling: Secondary | ICD-10-CM | POA: Insufficient documentation

## 2016-07-29 ENCOUNTER — Encounter: Payer: Self-pay | Admitting: Cardiovascular Disease

## 2016-07-29 ENCOUNTER — Ambulatory Visit (INDEPENDENT_AMBULATORY_CARE_PROVIDER_SITE_OTHER): Payer: Medicare Other | Admitting: Cardiovascular Disease

## 2016-07-29 VITALS — BP 128/70 | HR 71 | Ht 62.0 in | Wt 166.0 lb

## 2016-07-29 DIAGNOSIS — E7800 Pure hypercholesterolemia, unspecified: Secondary | ICD-10-CM | POA: Diagnosis not present

## 2016-07-29 DIAGNOSIS — I10 Essential (primary) hypertension: Secondary | ICD-10-CM | POA: Diagnosis not present

## 2016-07-29 DIAGNOSIS — I5032 Chronic diastolic (congestive) heart failure: Secondary | ICD-10-CM | POA: Insufficient documentation

## 2016-07-29 DIAGNOSIS — I480 Paroxysmal atrial fibrillation: Secondary | ICD-10-CM | POA: Diagnosis not present

## 2016-07-29 MED ORDER — POTASSIUM CHLORIDE ER 10 MEQ PO TBCR
10.0000 meq | EXTENDED_RELEASE_TABLET | ORAL | 3 refills | Status: DC
Start: 2016-07-29 — End: 2017-06-03

## 2016-07-29 MED ORDER — AMIODARONE HCL 200 MG PO TABS: 200.0000 mg | ORAL_TABLET | Freq: Every day | ORAL | 3 refills | Status: DC

## 2016-07-29 MED ORDER — FUROSEMIDE 20 MG PO TABS: 20.0000 mg | ORAL_TABLET | Freq: Every day | ORAL | 3 refills | Status: DC

## 2016-07-29 MED ORDER — APIXABAN 5 MG PO TABS: 5.0000 mg | ORAL_TABLET | Freq: Two times a day (BID) | ORAL | 3 refills | Status: DC

## 2016-07-29 MED ORDER — PRAVASTATIN SODIUM 40 MG PO TABS: 40.0000 mg | ORAL_TABLET | Freq: Every evening | ORAL | 3 refills | Status: DC

## 2016-07-29 MED ORDER — METOPROLOL TARTRATE 25 MG PO TABS: 25.0000 mg | ORAL_TABLET | Freq: Two times a day (BID) | ORAL | 3 refills | Status: DC

## 2016-07-29 MED FILL — METOPROLOL TARTRATE 25 MG T: 25 | 90 days supply | Qty: 180 | Fill #0

## 2016-07-29 MED FILL — POTASSIUM CL 10 MEQ TAB SA: 10 | 90 days supply | Qty: 90 | Fill #0

## 2016-07-29 MED FILL — ELIQUIS 5 MG TABLET: 5 | 90 days supply | Qty: 180 | Fill #0

## 2016-07-29 MED FILL — AMIODARONE HCL 200 MG TAB: 200 | 90 days supply | Qty: 90 | Fill #0

## 2016-07-29 MED FILL — PRAVASTATIN NA 40 MG TAB: 40 | 90 days supply | Qty: 90 | Fill #0

## 2016-07-29 NOTE — Patient Instructions (Signed)
Dr Sallyanne Kuster has recommended making the following medication changes: 1. STOP Simvastatin 2. START Pravastatin 40 mg - take 1 tablet by mouth every evening  Your physician recommends that you schedule a follow-up appointment in 3 months with Dr C.  If you need a refill on your cardiac medications before your next appointment, please call your pharmacy.

## 2016-07-29 NOTE — Progress Notes (Signed)
Cardiology Office Note    Date:  07/29/2016   ID:  Lisa [name], DOB 1941/03/21, MRN [id number]  PCP:  [name], MD  Cardiologist:   [name], MD   Chief Complaint  Patient presents with  . Follow-up    pt c/o doe    History of Present Illness:  Lisa [name] is a 76 y.o. female with fairly recent onset paroxysmal atrial fibrillation that led to decompensated heart failure in late 2017, cardioversion 05/27/2016, with recurrence and a successful cardioversion in ED 07/25/2016. She has widely metastatic breast cancer, hypertension, hyperlipidemia, stage II chronic kidney disease, remote history of smoking.  She has normal left ventricular systolic function and mild biatrial dilation and mild to moderate pulmonary artery hypertension by echo. She has evidence of coronary artery calcification, but has not undergone angiography. She is currently on amiodarone 200 mg daily, metoprolol and has been faithfully compliant with anticoagulation. She takes simvastatin for hyperlipidemia.  Past Medical History:  Diagnosis Date  . Breast cancer ([place]) 07-13-1997   right  . Chronic back pain   . CKD (chronic kidney disease), stage III   . Complication of anesthesia   . Coronary artery calcification of native artery    a. seen on PET scan 04/2016.  [name] Hypertension   . Pericardial effusion    a. small by TEE 05/2016.  [name] Persistent atrial fibrillation ([place])   . Pleural effusion 11-08-2008  . PONV (postoperative nausea and vomiting)   . Radiation 08/17/2015-08/29/2015   lower thoracic spine 22.5 gray  . Radiation 01/17/16-01/24/16   right femur 20Gy    Past Surgical History:  Procedure Laterality Date  . ABDOMINAL HYSTERECTOMY  02-21-1982  . BREAST BIOPSY  07-13-1997  . CARDIOVERSION N/A 05/27/2016   Procedure: CARDIOVERSION;  Surgeon: [name], MD;  Location: [facility];  Service: Cardiovascular;  Laterality: N/A;  . DILATION AND CURETTAGE, DIAGNOSTIC / THERAPEUTIC   12-13-1981  . MASTECTOMY  07-29-1997  . PELVIC LAPAROSCOPY  11-06-1981  . POSTERIOR LUMBAR FUSION 4 LEVEL N/A 09/01/2015   Procedure: Thoracic six-Lumbar one fusion with arthrodesis pedicle screw stabilization and decompression;  Surgeon: Ashok Pall, MD;  Location: [facility];  Service: Neurosurgery;  Laterality: N/A;  T6-L1 fusion with arthrodesis pedicle screw stabilization and decompression  . TEE WITHOUT CARDIOVERSION N/A 05/27/2016   Procedure: TRANSESOPHAGEAL ECHOCARDIOGRAM (TEE);  Surgeon: [name], MD;  Location: [name] ENDOSCOPY;  Service: Cardiovascular;  Laterality: N/A;  . THORACENTESIS  10-18-2008    Current Medications: Outpatient Medications Prior to Visit  Medication Sig Dispense Refill  . acetaminophen (TYLENOL) 500 MG tablet Take 500 mg by mouth every 6 (six) hours as needed for moderate pain.    [name] dexamethasone (DECADRON) 4 MG tablet Take 4 tabs (16 mg) 12 hours before taxol with food 12 tablet 0  . fluticasone (FLONASE) 50 MCG/ACT nasal spray Place 1 spray into both nostrils 2 (two) times daily. (Patient taking differently: Place 1 spray into both nostrils 2 (two) times daily as needed for allergies. ) 16 g 2  . loratadine (CLARITIN) 10 MG tablet Take 10 mg by mouth daily.     . ondansetron (ZOFRAN) 8 MG tablet Take 1 tablet (8 mg total) by mouth every 8 (eight) hours as needed for nausea or vomiting. 20 tablet 2  . traMADol (ULTRAM) 50 MG tablet Take 50-100 mg by mouth every 8 (eight) hours as needed for moderate pain.    . vitamin B-12 (CYANOCOBALAMIN) 1000 MCG tablet Take 1,000  mcg by mouth 2 (two) times daily.    . vitamin C (ASCORBIC ACID) 500 MG tablet Take 500 mg by mouth daily.      . vitamin E 200 UNIT capsule Take 200 Units by mouth daily.      [name] amiodarone (PACERONE) 200 MG tablet Take 1 tablet (200 mg total) by mouth daily. (Patient taking differently: Take 100 mg by mouth daily. ) 30 tablet 3  . apixaban (ELIQUIS) 5 MG TABS tablet Take 1 tablet (5 mg total) by  mouth 2 (two) times daily. MUST KEEP APPOINTMENT 07/29/2016 WITH DR Aryannah Mohon FOR FUTURE REFILLS 60 tablet 0  . furosemide (LASIX) 20 MG tablet Take 1 tablet (20 mg total) by mouth daily. 30 tablet 6  . metoprolol tartrate (LOPRESSOR) 25 MG tablet Take 1 tablet (25 mg total) by mouth 2 (two) times daily. MUST KEEP APPOINTMENT 07/29/2016 WITH DR Philipe Laswell FOR FUTURE REFILLS 60 tablet 0  . potassium chloride (K-DUR) 10 MEQ tablet TAKE 1 TABLET BY MOUTH ONCE DAILY OR AS DIRECTED 30 tablet 0  . simvastatin (ZOCOR) 20 MG tablet Take 20 mg by mouth at bedtime.      . ciprofloxacin (CIPRO) 500 MG tablet Take 500 mg by mouth 2 (two) times daily.      No facility-administered medications prior to visit.      Allergies:   Diphenhydramine hcl; Codeine sulfate; Oseltamivir phosphate; Oxycodone-acetaminophen; Prednisone; and Sudafed [pseudoephedrine hcl]   Social History   Social History  . Marital status: Single    Spouse name: N/A  . Number of children: 0  . Years of education: N/A   Occupational History  . Administraton for [employer]    Social History Main Topics  . Smoking status: Former Smoker    Packs/day: 0.50    Years: 30.00    Types: Cigarettes    Quit date: 07/22/1996  . Smokeless tobacco: Never Used     Comment: 1/2 up to 1 ppd  . Alcohol use No     Comment: hx of alcohol socially - hasn't had drink since March 1998  . Drug use: No  . Sexual activity: Not Asked   Other Topics Concern  . None   Social History Narrative  . None     Family History:  The patient's family history includes Aortic stenosis in her maternal uncle; Asthma in her father; Breast cancer in her cousin; Breast cancer (age of onset: 78) in her cousin; Diabetes in her paternal grandmother; Heart attack (age of onset: 96) in her maternal grandfather; Heart disease in her mother; Prostate cancer in her maternal uncle, maternal uncle, and maternal uncle; Prostate cancer (age of onset: 5) in her  maternal uncle.   ROS:   Please see the history of present illness.    ROS All other systems reviewed and are negative.   PHYSICAL EXAM:   VS:  BP 128/70   Pulse 71   Ht 5\' 2"  (1.575 m)   Wt 166 lb (75.3 kg)   SpO2 97%   BMI 30.36 kg/m    GEN: Well nourished, well developed, in no acute distress  HEENT: normal  Neck: no JVD, carotid bruits, or masses Cardiac: RRR; no murmurs, rubs, or gallops,no edema  Respiratory:  clear to auscultation bilaterally, normal work of breathing GI: soft, nontender, nondistended, + BS MS: no deformity or atrophy  Skin: warm and dry, no rash Neuro:  Alert and Oriented x 3, Strength and sensation are intact Psych: euthymic mood, full  affect  Wt Readings from Last 3 Encounters:  07/29/16 166 lb (75.3 kg)  07/25/16 163 lb (73.9 kg)  07/25/16 163 lb 8 oz (74.2 kg)      Studies/Labs Reviewed:   EKG:  EKG is ordered today.  The ekg ordered today demonstrates NSR, QTc 456 ms  Recent Labs: 05/22/2016: TSH 1.856 05/31/2016: Magnesium 2.1 07/25/2016: ALT 29; B Natriuretic Peptide 1,119.9; BUN 23; Creatinine, Ser 1.13; Potassium 4.4; Sodium 134 07/26/2016: HGB 10.8; Platelets 314   Lipid Panel    Component Value Date/Time   CHOL 134 05/23/2016 0304   CHOL 133 11/09/2015 1104   TRIG 247 (H) 05/23/2016 0304   TRIG 238 (H) 11/09/2015 1104   HDL 31 (L) 05/23/2016 0304   HDL 43 11/09/2015 1104   CHOLHDL 4.3 05/23/2016 0304   VLDL 49 (H) 05/23/2016 0304   LDLCALC 54 05/23/2016 0304   LDLCALC 42 11/09/2015 1104   LDLDIRECT 75.4 06/27/2008 0931    Additional studies/ records that were reviewed today include:  Records from recent visits in Afib clinic and emergency department    ASSESSMENT:    1. Paroxysmal atrial fibrillation (HCC)   2. Chronic diastolic CHF (congestive heart failure) ([place])   3. Essential hypertension   4. Pure hypercholesterolemia      PLAN:  In order of problems listed above:  1. CHF: She appears to be clinically  euvolemic. Mild ankle edema is chronic. No changes in diuretic dose recommended. Reminded about the importance of sodium restriction, especially while on high-dose steroids 2. AFib: Maintaining normal rhythm four days after cardioversion. Discussed the fact that amiodarone is a slow acting medication and that one really be reaching steady state levels for another few weeks (started in November). Since she has stayed in normal rhythm now, will maintain on the same 200 mg dose. She should continue anticoagulation. CHADSVasc 3 (age, gender, HTN). She had poor ventricular rate control during the recent episode of atrial fibrillation. She did not feel particularly ill, but is at risk of heart failure decompensation with lengthy spells of persistent atrial fibrillation. In the future, if she notes the irregular rhythm, would try to organize cardioversion in the endoscopy department, avoiding the emergency room. She did have a tiny pericardial effusion when she initially presented with atrial fibrillation. The to keep in mind the possibility of neoplastic pericardial involvement. 3. HTN: Well controlled 4. HLP: Risk of adverse drug interaction between amiodarone and simvastatin, switch to pravastatin    Medication Adjustments/Labs and Tests Ordered: Current medicines are reviewed at length with the patient today.  Concerns regarding medicines are outlined above.  Medication changes, Labs and Tests ordered today are listed in the Patient Instructions below. Patient Instructions  Dr Sallyanne Kuster has recommended making the following medication changes: 1. STOP Simvastatin 2. START Pravastatin 40 mg - take 1 tablet by mouth every evening  Your physician recommends that you schedule a follow-up appointment in 3 months with Dr C.  If you need a refill on your cardiac medications before your next appointment, please call your pharmacy.    Signed, [name], MD  07/29/2016 9:15 AM    [address], [place], [place]  [address] Phone: [phone]; Fax: [phone]

## 2016-07-31 ENCOUNTER — Telehealth: Payer: Self-pay | Admitting: Cardiovascular Disease

## 2016-07-31 NOTE — Telephone Encounter (Signed)
Agree 

## 2016-07-31 NOTE — Telephone Encounter (Signed)
Lisa [name] is calling because both feet have increased swelling from yesterday 07-30-16. She would like to know if Dr. Sallyanne Kuster would like her to increase her furosemide to increase urine or what she should do. Please call, thanks.

## 2016-07-31 NOTE — Telephone Encounter (Signed)
Spoke w patient and informed her Dr. Sallyanne Kuster is aware and in agreement w recommendations given - she voiced understanding and thanks for call.

## 2016-07-31 NOTE — Telephone Encounter (Signed)
Spoke to patient. She notes she's had mild bilateral leg swelling for some time, was present when seen in office earlier this week. However, she feels its worse. She noted she could probably increase her lasix but wanted to make sure it'd be OK w [place] to do so. She denies shortness of breath, increased fatigue, abd girth changes. Notes symptoms are swelling in feet and ankles only. I advised to go ahead and double her lasix (20mg  to 40mg ) in the AM tomorrow, and repeat Friday and Saturday if needed for resolution of increased swelling. Pt aware I will let Dr. Sallyanne Kuster be aware I gave her this advice - if labwork recheck, etc needed I will follow up w her. Recommended she call back if she notes any new concerns.

## 2016-08-01 ENCOUNTER — Other Ambulatory Visit: Payer: Self-pay | Admitting: Oncology

## 2016-08-01 ENCOUNTER — Ambulatory Visit (HOSPITAL_BASED_OUTPATIENT_CLINIC_OR_DEPARTMENT_OTHER): Payer: Medicare Other | Admitting: Oncology

## 2016-08-01 ENCOUNTER — Ambulatory Visit: Payer: Medicare Other

## 2016-08-01 ENCOUNTER — Other Ambulatory Visit (HOSPITAL_BASED_OUTPATIENT_CLINIC_OR_DEPARTMENT_OTHER): Payer: Medicare Other

## 2016-08-01 VITALS — BP 117/47 | HR 68 | Temp 97.5°F | Resp 18 | Ht 62.0 in | Wt 170.9 lb

## 2016-08-01 DIAGNOSIS — C7800 Secondary malignant neoplasm of unspecified lung: Secondary | ICD-10-CM | POA: Diagnosis not present

## 2016-08-01 DIAGNOSIS — Z7901 Long term (current) use of anticoagulants: Secondary | ICD-10-CM

## 2016-08-01 DIAGNOSIS — D701 Agranulocytosis secondary to cancer chemotherapy: Secondary | ICD-10-CM

## 2016-08-01 DIAGNOSIS — N183 Chronic kidney disease, stage 3 (moderate): Secondary | ICD-10-CM

## 2016-08-01 DIAGNOSIS — C7951 Secondary malignant neoplasm of bone: Secondary | ICD-10-CM

## 2016-08-01 DIAGNOSIS — T451X5A Adverse effect of antineoplastic and immunosuppressive drugs, initial encounter: Secondary | ICD-10-CM

## 2016-08-01 DIAGNOSIS — C50911 Malignant neoplasm of unspecified site of right female breast: Secondary | ICD-10-CM

## 2016-08-01 LAB — CBC WITH DIFFERENTIAL/PLATELET
BASO%: 0.4 % (ref 0.0–2.0)
Basophils Absolute: 0 10*3/uL (ref 0.0–0.1)
EOS ABS: 0 10*3/uL (ref 0.0–0.5)
EOS%: 0 % (ref 0.0–7.0)
HCT: 35.2 % (ref 34.8–46.6)
HEMOGLOBIN: 11.4 g/dL — AB (ref 11.6–15.9)
LYMPH%: 6.7 % — AB (ref 14.0–49.7)
MCH: 30 pg (ref 25.1–34.0)
MCHC: 32.4 g/dL (ref 31.5–36.0)
MCV: 92.6 fL (ref 79.5–101.0)
MONO#: 0.3 10*3/uL (ref 0.1–0.9)
MONO%: 3.7 % (ref 0.0–14.0)
NEUT%: 89.2 % — ABNORMAL HIGH (ref 38.4–76.8)
NEUTROS ABS: 7 10*3/uL — AB (ref 1.5–6.5)
PLATELETS: 239 10*3/uL (ref 145–400)
RBC: 3.8 10*6/uL (ref 3.70–5.45)
RDW: 17.3 % — AB (ref 11.2–14.5)
WBC: 7.9 10*3/uL (ref 3.9–10.3)
lymph#: 0.5 10*3/uL — ABNORMAL LOW (ref 0.9–3.3)

## 2016-08-01 LAB — COMPREHENSIVE METABOLIC PANEL
ALBUMIN: 2.9 g/dL — AB (ref 3.5–5.0)
ALK PHOS: 88 U/L (ref 40–150)
ALT: 17 U/L (ref 0–55)
ANION GAP: 9 meq/L (ref 3–11)
AST: 20 U/L (ref 5–34)
BILIRUBIN TOTAL: 0.8 mg/dL (ref 0.20–1.20)
BUN: 18.7 mg/dL (ref 7.0–26.0)
CO2: 20 meq/L — AB (ref 22–29)
CREATININE: 1 mg/dL (ref 0.6–1.1)
Calcium: 9.2 mg/dL (ref 8.4–10.4)
Chloride: 110 mEq/L — ABNORMAL HIGH (ref 98–109)
EGFR: 56 mL/min/{1.73_m2} — ABNORMAL LOW (ref >90)
GLUCOSE: 232 mg/dL — AB (ref 70–140)
Potassium: 4.2 mEq/L (ref 3.5–5.1)
SODIUM: 139 meq/L (ref 136–145)
TOTAL PROTEIN: 6 g/dL — AB (ref 6.4–8.3)

## 2016-08-01 NOTE — Progress Notes (Signed)
OFFICE PROGRESS NOTE   August 01, 2016   Physicians: J.Kinard, [name] (PCP), (R.Sharma), A.Kendall. K.Cabbell.,M.Croitoru  INTERVAL HISTORY:  Patient is seen, together with mother and uncle, in continuing attention to ER PR + metastatic breast cancer involving bone and chest, on taxol since 04-2016 and xgeva last 06-27-16.  With respiratory symptoms and other concerns, will hold taxol and xgeva again today.  Recent course has been complicated by recurrent A fib with RVR on 07-25-16, treated in ED without improvement from Cardiazem, then cardioverted successfully. CXR in ED consistent with mild CHF, and small bilateral pleural effusions. She had follow up with Dr Sallyanne Kuster 07-29-16. She continues Eliquis.  She has had respiratory infection since end of Dec, improving but not resolved. She saw Dr Philip Aspen on 07-26-16, given tamiflu due to ED visit, which she stopped due to nausea. She has been off antibiotics (cipro) since 07-25-16. She was neutropenic on 07-25-16, given additional granix. ANC is in normal range today (with decadron last pm) She has not fallen since 2 falls at home just prior to 07-25-16.   Patient reports "feeling better today from steroids" used as premedication for planned taxol, but "still feeling bad until the steroids". Cough some better but not resolved, minimally productive of white to slightly yellowish sputum. She is not SOB in exam room. She has had no fever, and upper respiratory congestion and drainage is improved. No chest pain and no rapid pulse by her monitoring. She increased lasix beginning this AM for increased weight and LE swelling. She is eating (prepared foods and eating out primarily), no N/V since stopping tamiflu. No new or different pain. Still wheezing when lies supine. No bleeding. No ear pain. Bowels ok, no diarrhea. Remainder of 14 point review of systems negative.   No central catheter Flu vaccine 07-09-16 No genetics testing Eliquis for A fib  Note  right axillary nodes x 12 1998 (no radiation right breast/ axilla)   ONCOLOGIC HISTORY Right breast carcinoma T2 N1, ER/PR-positive December 1998, treated with mastectomy with 12 node axillary evaluation and adjuvant Adriamycin/Cytoxan followed by 5 years of tamoxifen through December 2003. 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 CA 2729 was 434 in April 2010. She has tolerated Femara well and clinically has continued to do very well, though CA 27.29 has never normalized. 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 Patient seen by medical oncology 08-10-15 and radiation begun shortly afterwards by Dr Sondra Come, 22.5 of planned 35 Gy radiation to area of T10 thru 08-29-15. She had first zometa 08-11-15. MRI T spine obtained after some difficulty on 08-29-15, with pathologic compression fracture T10 with retropulsion of compressed vertebra to right with compression of cord called, involvement also T9, T11, L2. She had thoracic six- lumbar one posterolateral arthrodesis, T10 laminectomy, partial laminectomy T9, T11 by Dr Christella Noa on 09-01-15. She was begun on Aromasin on 09-07-15.  PET had been scheduled for 09-04-15, delayed with surgery, accomplished on 09-20-15 with chest adenopathy, increased soft tissue right lung base, extensive bone involvement.  Repeat PET 12-22-15 some improvement in area of radiation T spine, otherwise no improvement in bones, some increase right femur, some central chest adenopathy and right lower chest/ pleural involvement. Aromasin was discontinued and xeloda begun 01-05-16, used thru 04-29-16.  BRCA negative by [facility] 28 gene panel by [facility]  03-2016. Progressed in lung and bones by PET 05-02-16. Weekly taxol begun10-19-17, tho interrupted due to new onset A fib early 05-2016. Zometa used initially,  changed to xgeva 06-27-16.  Radiation to right pelvis / hip ~ 05-21-16 thru 06-06-16, 28 Gy.  Objective:  Vital signs in last 24 hours:  BP (!) 117/47 (BP Location: Left Arm, Patient Position: Sitting)   Pulse 68   Temp 97.5 F (36.4 C) (Oral)   Resp 18   Ht 5' 2"  (1.575 m)   Wt 170 lb 14.4 oz (77.5 kg)   SpO2 93%   BMI 31.26 kg/m  Weight up 7 lbs from 07-25-16 Alert, oriented and appropriate, looks a little better than at visit 07-25-16 but still generally looks more ill than baseline, with raspy cough intermittently. More pale, not icteric.  Ambulatory in exam room but using WC for halls..  Alopecia  HEENT:PERRL, sclerae not icteric. Oral mucosa moist without lesions, posterior pharynx clear.  Neck supple. No JVD.  Lymphatics:no cervical,supraclavicular adenopathy Resp: no wheezes, rales, rubs bilaterally. Decreased BS in right base as usual. No dullness to percussion Cardio: regular rate and rhythm, not tachycardic. No gallop. GI: soft, nontender, not distended, no mass or organomegaly. Normally active bowel sounds.  Musculoskeletal/ Extremities: LE trace pedal edema bilaterally without cords, tenderness. Back no increased tenderness, surgical scar not remarkable. Neuro: speech fluent, ambulates and repositions easily in exam room. No focal deficits. PSYCH talkative (on steroids), mood ok Skin without rash, ecchymosis, petechiae. Pale   Lab Results:  Results for orders placed or performed in visit on 08/01/16  CBC with Differential  Result Value Ref Range   WBC 7.9 3.9 - 10.3 10e3/uL   NEUT# 7.0 (H) 1.5 - 6.5 10e3/uL   HGB 11.4 (L) 11.6 - 15.9 g/dL   HCT 35.2 34.8 - 46.6 %   Platelets 239 145 - 400 10e3/uL   MCV 92.6 79.5 - 101.0 fL   MCH 30.0 25.1 - 34.0 pg   MCHC 32.4 31.5 - 36.0 g/dL   RBC 3.80 3.70 - 5.45 10e6/uL   RDW 17.3 (H) 11.2 - 14.5 %   lymph# 0.5 (L) 0.9 - 3.3 10e3/uL   MONO# 0.3 0.1 - 0.9 10e3/uL   Eosinophils Absolute 0.0 0.0 - 0.5 10e3/uL   Basophils  Absolute 0.0 0.0 - 0.1 10e3/uL   NEUT% 89.2 (H) 38.4 - 76.8 %   LYMPH% 6.7 (L) 14.0 - 49.7 %   MONO% 3.7 0.0 - 14.0 %   EOS% 0.0 0.0 - 7.0 %   BASO% 0.4 0.0 - 2.0 %  Comprehensive metabolic panel  Result Value Ref Range   Sodium 139 136 - 145 mEq/L   Potassium 4.2 3.5 - 5.1 mEq/L   Chloride 110 (H) 98 - 109 mEq/L   CO2 20 (L) 22 - 29 mEq/L   Glucose 232 (H) 70 - 140 mg/dl   BUN 18.7 7.0 - 26.0 mg/dL   Creatinine 1.0 0.6 - 1.1 mg/dL   Total Bilirubin 0.80 0.20 - 1.20 mg/dL   Alkaline Phosphatase 88 40 - 150 U/L   AST 20 5 - 34 U/L   ALT 17 0 - 55 U/L   Total Protein 6.0 (L) 6.4 - 8.3 g/dL   Albumin 2.9 (L) 3.5 - 5.0 g/dL   Calcium 9.2 8.4 - 10.4 mg/dL   Anion Gap 9 3 - 11 mEq/L   EGFR 56 (L) >90 ml/min/1.73 m2   Glucose likely up with steroids.  Calcium noted, T prot and  albumin lower today  Studies/Results: EXAM: CHEST  2 VIEW   07-25-16  COMPARISON:  06/14/2016 .  FINDINGS: Mediastinum hilar structures normal. Mild bilateral from interstitial prominence with small bilateral pleural effusions noted. Findings consistent mild congestive heart failure with mild interstitial edema. Findings less pronounced than on prior study of 06/14/2016 . Prior thoracolumbar spine fusion. Surgical clips right axilla .  IMPRESSION: Mild congestive heart failure mild interstitial edema and small pleural effusions  PACs images reviewed by MD  Medications: I have reviewed the patient's current medications. Hold taxol today. Hold xgeva today  DISCUSSION Patient still with lingering respiratory symptoms likely viral bronchitis and does not look stable today for planned taxol or xgeva. She is relieved by recommendation to delay until next week.  Probably will do best with taxol weekly x 2 or 3 then one week off; does need granix.   Patient to speak with RN day prior to treatment next week, as would not use premedication decadron if unable to be treated.   Recent falls at home likely  multifactorial, both when she turned and "felt knee give way" and may have been in rapid a fib then, as this was shortly prior to identifying the recurrent cardiac problem. Will see if PT eval at home is possible, as overall situation not able to manage outpatient balance PT. Did not discuss with patient now, will look into insurance first.  Assessment/Plan:  1. Metastatic breast cancer tolungs and bone: Clinically improving onweekly taxol beginning 05-09-16, several delays including for counts, rapid A fib, respiratory infection etc. Hold taxol and xgeva again today, hope to treat next week if improved. Patient to speak with RN day prior to treatment next week, as would not use premedication decadron if unable to be treated.  2.Recurrent atrial fib with rapid ventricular rate 07-25-16: cardioverted in ED 07-25-16.  On Eliquis, amiodarone, metoprolol since previous cardioversion 05-2016, and  lasix for CHF symptoms. Saw Dr Sallyanne Kuster 07-29-16 3.Pathologic compression fracture T10, radiation, and retropulsion of bone impinging on spinal cord for which she had T6 - L1 laminectomy by Dr Christella Noa 09-01-15. Radiation to right iliac area 05-2016 3. Neutropenia from chemo: granix today, repeat counts 1-5/ possible additional granix then 4.Mild hypercalcemia resolved, follow, continue xgeva (previously zometa). 5.Respiratory infection likely viral, gradually improving but not resolved, no longer neutropenic. Dr Philip Aspen aware. No symptoms of influenza tho did not complete course of prophylactic tamiflu after being in ED 6.remote past tobacco, none since 1998 7.social situation: patient is primary caregiver for 64 yo mother, who was recently hospitalized for CHF 8.Advance Directives in place .9. Falls at home x 2 this week, without injury. Likely multifactorial. Will see if home PT evaluation is possible 10. CKD III. Creatinine stable today 11.environmental allergies: had been on allergy injections x years  previously 12.flu vaccine 07-09-16   All questions answered and they know to call if needed. Message to collaborative RN to follow up by phone next week. Time spent 25 min including >50% counseling and coordination of care. Cc Dr Philip Aspen   Evlyn Clines, MD   08/01/2016, 1:35 PM

## 2016-08-02 ENCOUNTER — Ambulatory Visit: Payer: Medicare Other

## 2016-08-02 LAB — CANCER ANTIGEN 27.29: CA 27.29: 231.7 U/mL — ABNORMAL HIGH (ref 0.0–38.6)

## 2016-08-03 ENCOUNTER — Encounter: Payer: Self-pay | Admitting: Oncology

## 2016-08-05 ENCOUNTER — Telehealth: Payer: Self-pay | Admitting: Cardiovascular Disease

## 2016-08-05 DIAGNOSIS — I5032 Chronic diastolic (congestive) heart failure: Secondary | ICD-10-CM

## 2016-08-05 NOTE — Telephone Encounter (Signed)
Lisa [name] is calling because she was told to double up on her Lasix for 3 days . Her weight is 160 today and her ankles has come down somewhat and she is wanting to know what she is to do . Because she is doubling up on her medication she will soon be out , will she need to come in or will a new prescription be sent to her pharmacy . Please call

## 2016-08-05 NOTE — Telephone Encounter (Signed)
Pt of Dr. Sallyanne Kuster  Re: LE edema. (see 07/31/16 enc note)  Note I called patient and we spoke on the 10th. She doubled lasix from 20mg  to 40mg  due to leg swelling -- dose increases on 11th, 12th and 13th.  Pt informs me "swelling is going down, but not as fast as I thought it would".  States she's noticed that the swelling is getting better, but still there. She denies other symptoms. She states she'll be out of medication soon - so she'll want to go get refill. We discussed. I'm wondering if it would be reasonable to keep her at 40mg  furosemide daily. If so she'd need authorization for the higher dose.  Pt aware I am sending to Dr. Sallyanne Kuster for review.

## 2016-08-06 NOTE — Telephone Encounter (Signed)
Mrs. Dial is calling about what she is to do about the swelling in her legs . Please call   Thanks

## 2016-08-06 NOTE — Telephone Encounter (Signed)
I would continue with the 40 mg of Lasix daily, until her weight and edema is back down to normal., Sometimes taking an additional 20 mg 6 hours after the first dose for the first day or 2 also help. Once the swelling is back down to baseline then just simply return back to 20 mg once daily.  I would recommend that after that she does daily weights and monitors or swelling. Would simply then increase to 40 mg if the swelling gets worse - until back to baseline.  Glenetta Hew, MD

## 2016-08-06 NOTE — Telephone Encounter (Signed)
Pt called back but I am waiting on provider advice.  Checked and Dr. Sallyanne Kuster out of office this week - will seek review by [employer]

## 2016-08-07 ENCOUNTER — Telehealth: Payer: Self-pay

## 2016-08-07 NOTE — Telephone Encounter (Signed)
Lisa [name] states that she is feeling better from the respiratory infection. She will not be able to come in for appointments tomorrow as the weather is going to be too harsh.  Cancelled appointment for 08-08-16 and injection for 08-09-17. Dr. Marko Plume said to keep appointments as scheduled for 08-15-16.

## 2016-08-07 NOTE — Telephone Encounter (Signed)
-----   Message from Gordy Levan, MD sent at 08/03/2016  2:01 PM EST ----- Regarding: phone Wed 1-17 Please see how she is by phone on 1-16 or 1-17 If feeling stronger and respiratory infection better will treat on 1-18 with taxol and xgeva. Steroids for taxol  thanks

## 2016-08-08 ENCOUNTER — Other Ambulatory Visit: Payer: Medicare Other

## 2016-08-08 ENCOUNTER — Ambulatory Visit: Payer: Medicare Other

## 2016-08-09 ENCOUNTER — Ambulatory Visit: Payer: Medicare Other

## 2016-08-09 MED ORDER — FUROSEMIDE 40 MG PO TABS: 40.0000 mg | ORAL_TABLET | Freq: Every day | ORAL | 3 refills | Status: DC

## 2016-08-09 MED FILL — FUROSEMIDE 40 MG TABLET: 40 | 90 days supply | Qty: 90 | Fill #0

## 2016-08-09 NOTE — Telephone Encounter (Signed)
Spoke w patient and explained physician recommendations to patient. She expressed understanding.  She will start as recommended and return to 20mg  dose once swelling down. She will continue to track weights and lower extremity swelling. I've sent in the 40mg  tablets for her convenience but she is aware to call if she requires extended use of this dose.

## 2016-08-11 ENCOUNTER — Other Ambulatory Visit: Payer: Self-pay | Admitting: Oncology

## 2016-08-15 ENCOUNTER — Ambulatory Visit (HOSPITAL_BASED_OUTPATIENT_CLINIC_OR_DEPARTMENT_OTHER): Payer: Medicare Other | Admitting: Oncology

## 2016-08-15 ENCOUNTER — Other Ambulatory Visit (HOSPITAL_BASED_OUTPATIENT_CLINIC_OR_DEPARTMENT_OTHER): Payer: Medicare Other

## 2016-08-15 ENCOUNTER — Ambulatory Visit (HOSPITAL_BASED_OUTPATIENT_CLINIC_OR_DEPARTMENT_OTHER): Payer: Medicare Other

## 2016-08-15 VITALS — BP 149/74 | HR 69 | Temp 97.3°F | Resp 18 | Ht 62.0 in | Wt 162.3 lb

## 2016-08-15 DIAGNOSIS — Z79899 Other long term (current) drug therapy: Secondary | ICD-10-CM

## 2016-08-15 DIAGNOSIS — Z7901 Long term (current) use of anticoagulants: Secondary | ICD-10-CM

## 2016-08-15 DIAGNOSIS — Z5111 Encounter for antineoplastic chemotherapy: Secondary | ICD-10-CM

## 2016-08-15 DIAGNOSIS — C7951 Secondary malignant neoplasm of bone: Secondary | ICD-10-CM | POA: Diagnosis not present

## 2016-08-15 DIAGNOSIS — C50911 Malignant neoplasm of unspecified site of right female breast: Secondary | ICD-10-CM

## 2016-08-15 DIAGNOSIS — C7800 Secondary malignant neoplasm of unspecified lung: Secondary | ICD-10-CM

## 2016-08-15 DIAGNOSIS — N183 Chronic kidney disease, stage 3 unspecified: Secondary | ICD-10-CM

## 2016-08-15 DIAGNOSIS — Z9181 History of falling: Secondary | ICD-10-CM

## 2016-08-15 DIAGNOSIS — I4891 Unspecified atrial fibrillation: Secondary | ICD-10-CM

## 2016-08-15 LAB — COMPREHENSIVE METABOLIC PANEL
ALBUMIN: 3.6 g/dL (ref 3.5–5.0)
ALK PHOS: 98 U/L (ref 40–150)
ALT: 26 U/L (ref 0–55)
ANION GAP: 12 meq/L — AB (ref 3–11)
AST: 30 U/L (ref 5–34)
BUN: 25.9 mg/dL (ref 7.0–26.0)
CALCIUM: 10.5 mg/dL — AB (ref 8.4–10.4)
CHLORIDE: 108 meq/L (ref 98–109)
CO2: 19 mEq/L — ABNORMAL LOW (ref 22–29)
Creatinine: 1.2 mg/dL — ABNORMAL HIGH (ref 0.6–1.1)
EGFR: 47 mL/min/{1.73_m2} — AB (ref >90)
Glucose: 187 mg/dl — ABNORMAL HIGH (ref 70–140)
POTASSIUM: 4 meq/L (ref 3.5–5.1)
Sodium: 139 mEq/L (ref 136–145)
Total Bilirubin: 0.74 mg/dL (ref 0.20–1.20)
Total Protein: 7.4 g/dL (ref 6.4–8.3)

## 2016-08-15 LAB — CBC WITH DIFFERENTIAL/PLATELET
BASO%: 0.2 % (ref 0.0–2.0)
BASOS ABS: 0 10*3/uL (ref 0.0–0.1)
EOS ABS: 0 10*3/uL (ref 0.0–0.5)
EOS%: 0.2 % (ref 0.0–7.0)
HEMATOCRIT: 38.9 % (ref 34.8–46.6)
HGB: 13 g/dL (ref 11.6–15.9)
LYMPH%: 12.2 % — ABNORMAL LOW (ref 14.0–49.7)
MCH: 30 pg (ref 25.1–34.0)
MCHC: 33.4 g/dL (ref 31.5–36.0)
MCV: 89.6 fL (ref 79.5–101.0)
MONO#: 0.1 10*3/uL (ref 0.1–0.9)
MONO%: 1.4 % (ref 0.0–14.0)
NEUT#: 5.6 10*3/uL (ref 1.5–6.5)
NEUT%: 86 % — AB (ref 38.4–76.8)
PLATELETS: 299 10*3/uL (ref 145–400)
RBC: 4.34 10*6/uL (ref 3.70–5.45)
RDW: 16.3 % — ABNORMAL HIGH (ref 11.2–14.5)
WBC: 6.6 10*3/uL (ref 3.9–10.3)
lymph#: 0.8 10*3/uL — ABNORMAL LOW (ref 0.9–3.3)

## 2016-08-15 MED ORDER — ONDANSETRON HCL 8 MG PO TABS
ORAL_TABLET | ORAL | Status: AC
  Filled 2016-08-15: qty 1

## 2016-08-15 MED ORDER — FAMOTIDINE IN NACL 20-0.9 MG/50ML-% IV SOLN
INTRAVENOUS | Status: AC
  Filled 2016-08-15: qty 50

## 2016-08-15 MED ORDER — DEXAMETHASONE SODIUM PHOSPHATE 10 MG/ML IJ SOLN
INTRAMUSCULAR | Status: AC
  Filled 2016-08-15: qty 1

## 2016-08-15 MED ORDER — PACLITAXEL CHEMO INJECTION 300 MG/50ML
80.0000 mg/m2 | Freq: Once | INTRAVENOUS | Status: AC
  Administered 2016-08-15: 150 mg via INTRAVENOUS
  Filled 2016-08-15: qty 25

## 2016-08-15 MED ORDER — FAMOTIDINE IN NACL 20-0.9 MG/50ML-% IV SOLN
20.0000 mg | Freq: Once | INTRAVENOUS | Status: AC
  Administered 2016-08-15: 20 mg via INTRAVENOUS

## 2016-08-15 MED ORDER — ONDANSETRON HCL 8 MG PO TABS
8.0000 mg | ORAL_TABLET | Freq: Once | ORAL | Status: AC
  Administered 2016-08-15: 8 mg via ORAL

## 2016-08-15 MED ORDER — LORATADINE 10 MG PO TABS
10.0000 mg | ORAL_TABLET | Freq: Every day | ORAL | Status: DC
  Administered 2016-08-15: 10 mg via ORAL
  Filled 2016-08-15: qty 1

## 2016-08-15 MED ORDER — DEXAMETHASONE SODIUM PHOSPHATE 10 MG/ML IJ SOLN
10.0000 mg | Freq: Once | INTRAMUSCULAR | Status: AC
  Administered 2016-08-15: 10 mg via INTRAVENOUS

## 2016-08-15 MED ORDER — SODIUM CHLORIDE 0.9 % IV SOLN
Freq: Once | INTRAVENOUS | Status: AC
  Administered 2016-08-15: 16:00:00 via INTRAVENOUS

## 2016-08-15 NOTE — Patient Instructions (Signed)
[facility] Discharge Instructions for Patients Receiving Chemotherapy  Today you received the following chemotherapy agents Taxol.  To help prevent nausea and vomiting after your treatment, we encourage you to take your nausea medication as directed.    If you develop nausea and vomiting that is not controlled by your nausea medication, call the clinic.   BELOW ARE SYMPTOMS THAT SHOULD BE REPORTED IMMEDIATELY:  *FEVER GREATER THAN 100.5 F  *CHILLS WITH OR WITHOUT FEVER  NAUSEA AND VOMITING THAT IS NOT CONTROLLED WITH YOUR NAUSEA MEDICATION  *UNUSUAL SHORTNESS OF BREATH  *UNUSUAL BRUISING OR BLEEDING  TENDERNESS IN MOUTH AND THROAT WITH OR WITHOUT PRESENCE OF ULCERS  *URINARY PROBLEMS  *BOWEL PROBLEMS  UNUSUAL RASH Items with * indicate a potential emergency and should be followed up as soon as possible.  Feel free to call the clinic you have any questions or concerns. The clinic phone number is [phone].    

## 2016-08-15 NOTE — Progress Notes (Signed)
OFFICE PROGRESS NOTE   August 15, 2016   Physicians: J.Kinard, [name] (PCP), (R.Sharma), A.Kendall. K.Cabbell.,M.Croitoru   Today's appointment rescheduled from 08-08-16 due to weather.   INTERVAL HISTORY:  Patient is seen, together with mother and uncle, for metastatic breast cancer now on treatment with low dose taxol and xgeva. She has needed granix day after each taxol.   Initial breast cancer diagnosis was 1998, metastatic to pleura 2010, progression in bone 07-2015. She had thoracic laminectomy 08-2015. She progressed on Aromasin then on xeloda in 2017, with taxol begun 05-09-16. Recent course has been complicated by A fib with RVR and prolonged respiratory infection.  Last Xgeva 06-27-16, not given today with concern about hydration.   Patient reports overall feeling better since lasix increased to 40 mg daily, see phone note from cardiology 08-09-16, with imrovement in LE swelling and SOB. HR has not been rapid, no chest pain, no bleeding on Eliquis. She feels stronger, no more falls or near syncope, ambulatory in office today. Cough and upper respiratory congestion have nearly resolved, no fever. Appetite is good, eats out regularly, does not limit salt,  is thirsty on lasix. Bowels ok. She has slight rash on cheeks in past 1-2 days, slight itching there and shoulders, no different soap/ lotion. Bowels ok. No bone pain, tho back gets tired when up at length, unchanged. No significant peripheral neuropathy. No problems at IV access sites. Is able to sleep.  Remainder of 14 point Review of Systems negative.    No central catheter Flu vaccine 07-09-16 No genetics testing Eliquis for A fib  Note right axillary nodes x 12 1998 (no radiation right breast/ axilla)  ONCOLOGIC HISTORY Right breast carcinoma T2 N1, ER/PR-positive 06-1997, treated with mastectomy with 12 node axillary evaluation and adjuvant Adriamycin/Cytoxan followed by 5 years of tamoxifen through December 2003. The  breast cancer was found metastatic to pleura, hormone positive in early 2010, treated with VATS sclerosis by Dr.Burney April 2010 and on Femara from 4- 2010 thru 07-2015. CA 2729 was 434 in April 2010, never normalized, was 226 07-2015. 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 Radiation by Dr Sondra Come, 22.5 of planned 35 Gy radiation to area of T10 thru 08-29-15. She had first zometa 08-11-15. MRI T spine finally obtained on 08-29-15, with pathologic compression fracture T10 with retropulsion of compressed vertebra to right with compression of cord called, involvement also T9, T11, L2. She had thoracic six- lumbar one posterolateral arthrodesis, T10 laminectomy, partial laminectomy T9, T11 by Dr Christella Noa on 09-01-15. She was begun on Aromasin on 09-07-15.  PET had been scheduled for 09-04-15, delayed with surgery, accomplished on 09-20-15 with chest adenopathy, increased soft tissue right lung base, extensive bone involvement.  Repeat PET 12-22-15 some improvement in area of radiation T spine, otherwise no improvement in bones, some increase right femur, some central chest adenopathy and right lower chest/ pleural involvement. Aromasin was discontinued and xeloda begun 01-05-16, used thru 04-29-16.  BRCA negative by [facility] 28 gene panel by [facility] 03-2016. Progressed in lung and bones by PET 05-02-16. Weekly taxol begun10-19-17, tho interrupted due to new onset A fib early 05-2016. Zometa used initially, changed to xgeva 06-27-16.  Radiation to right pelvis / hip ~ 05-21-16 thru 06-06-16, 28 Gy.   Objective:  Vital signs in last 24 hours:  BP (!) 149/74 (BP Location: Left Arm, Patient Position: Sitting)   Pulse 69   Temp 97.3 F (36.3  C) (Oral)   Resp 18   Ht 5' 2"  (1.575 m)   Wt 162 lb 4.8 oz (73.6 kg)   SpO2 97%   BMI 29.69 kg/m  Weight down 8 lbs from  Alert, oriented and appropriate, looks better overall today, talkative,  NAD.Respirations not labored RA.  Ambulatory with rolling walker.  Alopecia  HEENT:PERRL, sclerae not icteric. Oral mucosa moist without lesions, posterior pharynx clear. Less nasally congested. Neck supple. No JVD.  Lymphatics:no supraclavicula or axillary adenopathy Resp: decreased BS right base unchanged since VATS, otherwise clear to auscultation bilaterally and no other dullness to percussion  Cardio: regular rate and rhythm at 70. No gallop.Clear heart sounds GI: soft, nontender, not distended, no mass or organomegaly. Normally active bowel sounds. Surgical incision not remarkable. Musculoskeletal/ Extremities: UE / LE without pitting edema, cords, tenderness Neuro: no significant peripheral neuropathy. Otherwise nonfocal. PSYCH appropriate mood and affect Skin slight linear areas of erythema right cheek, otherwise without rash, ecchymosis, petechiae Breasts: right mastectomy. Left breast exam not repeated today. No central catheter  Lab Results:  Results for orders placed or performed in visit on 08/15/16  CBC with Differential  Result Value Ref Range   WBC 6.6 3.9 - 10.3 10e3/uL   NEUT# 5.6 1.5 - 6.5 10e3/uL   HGB 13.0 11.6 - 15.9 g/dL   HCT 38.9 34.8 - 46.6 %   Platelets 299 145 - 400 10e3/uL   MCV 89.6 79.5 - 101.0 fL   MCH 30.0 25.1 - 34.0 pg   MCHC 33.4 31.5 - 36.0 g/dL   RBC 4.34 3.70 - 5.45 10e6/uL   RDW 16.3 (H) 11.2 - 14.5 %   lymph# 0.8 (L) 0.9 - 3.3 10e3/uL   MONO# 0.1 0.1 - 0.9 10e3/uL   Eosinophils Absolute 0.0 0.0 - 0.5 10e3/uL   Basophils Absolute 0.0 0.0 - 0.1 10e3/uL   NEUT% 86.0 (H) 38.4 - 76.8 %   LYMPH% 12.2 (L) 14.0 - 49.7 %   MONO% 1.4 0.0 - 14.0 %   EOS% 0.2 0.0 - 7.0 %   BASO% 0.2 0.0 - 2.0 %  Comprehensive metabolic panel  Result Value Ref Range   Sodium 139 136 - 145 mEq/L   Potassium 4.0 3.5 - 5.1 mEq/L   Chloride 108 98 - 109 mEq/L   CO2 19 (L) 22 - 29 mEq/L   Glucose 187 (H) 70 - 140 mg/dl   BUN 25.9 7.0 - 26.0 mg/dL   Creatinine 1.2  (H) 0.6 - 1.1 mg/dL   Total Bilirubin 0.74 0.20 - 1.20 mg/dL   Alkaline Phosphatase 98 40 - 150 U/L   AST 30 5 - 34 U/L   ALT 26 0 - 55 U/L   Total Protein 7.4 6.4 - 8.3 g/dL   Albumin 3.6 3.5 - 5.0 g/dL   Calcium 10.5 (H) 8.4 - 10.4 mg/dL   Anion Gap 12 (H) 3 - 11 mEq/L   EGFR 47 (L) >90 ml/min/1.73 m2   08-01-16 CA 2729 was 231, this having been 353 in 04-2016.  Studies/Results:  None since CXR in ED with cardioversion 07-25-16  Medications: I have reviewed the patient's current medications, presently lasix 40 mg daily. Hold xgeva today with increased creatinine / hydration concerns Taxol 80 mg/m2 today and then likely after she sees Dr Lindi Adie   DISCUSSION Mentioned that taxol schedule may be better tolerated now using day 1 day 8 every 21 days, however will not try to treat with another taxol after today until she  sees Dr Lindi Adie on 08-27-16. She understands that,  if she improves adequately on the chemo, she may be able to change to another regimen; she has not had Ibrance with hormonal therapy.   Expect xgeva or other bisphosphonate appropriate again when stable for that.  Assessment/Plan:  1. Metastatic breast cancer tolungs and bone: Clinically improving onweekly taxol beginning 05-09-16, several delays including for counts, rapid A fib, respiratory infection etc. WIll give taxol today, again hold xgeva as above. She will see Dr Lindi Adie on 08-27-16 - thank you.   2.Recurrent atrial fib with rapid ventricular rate 07-25-16: cardioverted in ED 07-25-16.  On Eliquis, amiodarone, metoprolol since previous cardioversion 05-2016, and  lasix for CHF symptoms. Saw Dr Sallyanne Kuster 07-29-16. Cardiology managing lasix. Rash possibly from meds.  3.Pathologic compression fracture T10, radiation, and retropulsion of bone impinging on spinal cord for which she had T6 - L1 laminectomy by Dr Christella Noa 09-01-15. Radiation to right iliac area 05-2016 3. Neutropenia from even this low dose taxol: granix after each  taxol has maintained counts 4.Mild hypercalcemia again today, note lasix as above. Not symptomatic. Expect she will continue xgeva or other bisphosphonate upcoming.  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 Patient and mother live together, aunt and uncle assist. 8.Advance Directives in place .9. Falls at home each time she has been in a fib with RVR. She does not have palpitations or other symptoms with the a fib. 10. CKD III. Creatinine a little higher today, see lasix. She is drinking fluids well. 11.environmental allergies: had been on allergy injections x years previously 12.flu vaccine 07-09-16   All questions answered and she knows to call if needed prior to next scheduled appointment. Chemo and granix orders confirmed. Time spent 25 min including >50% counseling and coordination of care. Cc Dr Philip Aspen, Dr Sallyanne Kuster   Evlyn Clines, MD   08/15/2016, 5:40 PM

## 2016-08-16 ENCOUNTER — Encounter: Payer: Self-pay | Admitting: *Deleted

## 2016-08-16 ENCOUNTER — Telehealth: Payer: Self-pay | Admitting: *Deleted

## 2016-08-16 ENCOUNTER — Ambulatory Visit (HOSPITAL_BASED_OUTPATIENT_CLINIC_OR_DEPARTMENT_OTHER): Payer: Medicare Other

## 2016-08-16 VITALS — BP 116/58 | HR 72 | Temp 98.5°F | Resp 18

## 2016-08-16 DIAGNOSIS — T451X5A Adverse effect of antineoplastic and immunosuppressive drugs, initial encounter: Principal | ICD-10-CM

## 2016-08-16 DIAGNOSIS — C50911 Malignant neoplasm of unspecified site of right female breast: Secondary | ICD-10-CM | POA: Diagnosis not present

## 2016-08-16 DIAGNOSIS — D701 Agranulocytosis secondary to cancer chemotherapy: Secondary | ICD-10-CM | POA: Diagnosis not present

## 2016-08-16 DIAGNOSIS — C7951 Secondary malignant neoplasm of bone: Secondary | ICD-10-CM

## 2016-08-16 DIAGNOSIS — C50919 Malignant neoplasm of unspecified site of unspecified female breast: Secondary | ICD-10-CM

## 2016-08-16 MED ORDER — TBO-FILGRASTIM 300 MCG/0.5ML ~~LOC~~ SOSY
300.0000 ug | PREFILLED_SYRINGE | Freq: Once | SUBCUTANEOUS | Status: AC
  Administered 2016-08-16: 300 ug via SUBCUTANEOUS
  Filled 2016-08-16: qty 0.5

## 2016-08-16 NOTE — Telephone Encounter (Signed)
Walk in discussion w patient regarding her medications. She was having side effects of rash, itching, rosy cheeks. Itching began several weeks ago, patient states this seemed coincident with start of her meds post-cardioversion.  She's also an oncology patient and gets Granix infusions, but these are not thought to produce her symptoms.  Notes rash very mild, itching occasional, she was actually more concerned about her Brand Siever colored skin on her face. Spoke w Dr. Sallyanne Kuster who advised weaning dose of metoprolol in half x2-3 days then discontinuing med. Also recommended cutting lasix dose and taking full dose as needed on days her weight is 3-5 lbs above her dry weight.   Communicated instructions to patient.  Advised her to call me back in 1-2 weeks w update on outcome of metoprolol discontinuation, so that I can get her further assistance or update her allergy list accordingly.

## 2016-08-16 NOTE — Patient Instructions (Signed)
Tbo-Filgrastim injection (Granix) What is this medicine? TBO-FILGRASTIM (T B O fil GRA stim) is a granulocyte colony-stimulating factor that stimulates the growth of neutrophils, a type of white blood cell important in the body's fight against infection. It is used to reduce the incidence of fever and infection in patients with certain types of cancer who are receiving chemotherapy that affects the bone marrow. COMMON BRAND NAME(S): Granix What should I tell my health care provider before I take this medicine? They need to know if you have any of these conditions: -ongoing radiation therapy -sickle cell anemia -an unusual or allergic reaction to tbo-filgrastim, filgrastim, pegfilgrastim, other medicines, foods, dyes, or preservatives -pregnant or trying to get pregnant -breast-feeding How should I use this medicine? This medicine is for injection under the skin. If you get this medicine at home, you will be taught how to prepare and give this medicine. Refer to the Instructions for Use that come with your medication packaging. Use exactly as directed. Take your medicine at regular intervals. Do not take your medicine more often than directed. It is important that you put your used needles and syringes in a special sharps container. Do not put them in a trash can. If you do not have a sharps container, call your pharmacist or healthcare provider to get one. Talk to your pediatrician regarding the use of this medicine in children. Special care may be needed. What if I miss a dose? It is important not to miss your dose. Call your doctor or health care professional if you miss a dose. What may interact with this medicine? This medicine may interact with the following medications: -medicines that may cause a release of neutrophils, such as lithium What should I watch for while using this medicine? You may need blood work done while you are taking this medicine. What side effects may I notice from  receiving this medicine? Side effects that you should report to your doctor or health care professional as soon as possible: -allergic reactions like skin rash, itching or hives, swelling of the face, lips, or tongue -shortness of breath or breathing problems -fever -pain, redness, or irritation at site where injected -pinpoint red spots on the skin -stomach or side pain, or pain at the shoulder -swelling -tiredness -trouble passing urine Side effects that usually do not require medical attention (report to your doctor or health care professional if they continue or are bothersome): -bone pain -muscle pain Where should I keep my medicine? Keep out of the reach of children. Store in a refrigerator between 2 and 8 degrees C (36 and 46 degrees F). Keep in carton to protect from light. Throw away this medicine if it is left out of the refrigerator for more than 5 consecutive days. Throw away any unused medicine after the expiration date.  2017 [employer]/Gold Standard (2015-08-10 12:15:25)

## 2016-08-18 ENCOUNTER — Encounter: Payer: Self-pay | Admitting: Oncology

## 2016-08-27 ENCOUNTER — Ambulatory Visit: Payer: Medicare Other | Admitting: Hematology and Oncology

## 2016-08-27 ENCOUNTER — Other Ambulatory Visit: Payer: Medicare Other

## 2016-08-29 ENCOUNTER — Other Ambulatory Visit: Payer: Self-pay | Admitting: Hematology and Oncology

## 2016-08-29 ENCOUNTER — Encounter: Payer: Self-pay | Admitting: Hematology and Oncology

## 2016-08-29 ENCOUNTER — Ambulatory Visit (HOSPITAL_BASED_OUTPATIENT_CLINIC_OR_DEPARTMENT_OTHER): Payer: Medicare Other | Admitting: Hematology and Oncology

## 2016-08-29 ENCOUNTER — Other Ambulatory Visit (HOSPITAL_BASED_OUTPATIENT_CLINIC_OR_DEPARTMENT_OTHER): Payer: Medicare Other

## 2016-08-29 ENCOUNTER — Ambulatory Visit (HOSPITAL_BASED_OUTPATIENT_CLINIC_OR_DEPARTMENT_OTHER): Payer: Medicare Other

## 2016-08-29 VITALS — BP 179/81 | HR 96 | Temp 98.1°F | Resp 18 | Wt 165.1 lb

## 2016-08-29 DIAGNOSIS — C7951 Secondary malignant neoplasm of bone: Secondary | ICD-10-CM | POA: Diagnosis not present

## 2016-08-29 DIAGNOSIS — C7800 Secondary malignant neoplasm of unspecified lung: Secondary | ICD-10-CM

## 2016-08-29 DIAGNOSIS — C50911 Malignant neoplasm of unspecified site of right female breast: Secondary | ICD-10-CM

## 2016-08-29 DIAGNOSIS — C782 Secondary malignant neoplasm of pleura: Secondary | ICD-10-CM | POA: Diagnosis not present

## 2016-08-29 DIAGNOSIS — Z5111 Encounter for antineoplastic chemotherapy: Secondary | ICD-10-CM | POA: Diagnosis not present

## 2016-08-29 DIAGNOSIS — D709 Neutropenia, unspecified: Secondary | ICD-10-CM | POA: Diagnosis not present

## 2016-08-29 DIAGNOSIS — I4891 Unspecified atrial fibrillation: Secondary | ICD-10-CM

## 2016-08-29 DIAGNOSIS — D701 Agranulocytosis secondary to cancer chemotherapy: Secondary | ICD-10-CM

## 2016-08-29 DIAGNOSIS — N183 Chronic kidney disease, stage 3 (moderate): Secondary | ICD-10-CM

## 2016-08-29 DIAGNOSIS — C50919 Malignant neoplasm of unspecified site of unspecified female breast: Secondary | ICD-10-CM

## 2016-08-29 DIAGNOSIS — T451X5A Adverse effect of antineoplastic and immunosuppressive drugs, initial encounter: Secondary | ICD-10-CM

## 2016-08-29 LAB — CBC WITH DIFFERENTIAL/PLATELET
BASO%: 0 % (ref 0.0–2.0)
Basophils Absolute: 0 10*3/uL (ref 0.0–0.1)
EOS%: 0.5 % (ref 0.0–7.0)
Eosinophils Absolute: 0 10*3/uL (ref 0.0–0.5)
HEMATOCRIT: 39 % (ref 34.8–46.6)
HGB: 12.9 g/dL (ref 11.6–15.9)
LYMPH#: 0.5 10*3/uL — AB (ref 0.9–3.3)
LYMPH%: 24.8 % (ref 14.0–49.7)
MCH: 29.2 pg (ref 25.1–34.0)
MCHC: 33.1 g/dL (ref 31.5–36.0)
MCV: 88.2 fL (ref 79.5–101.0)
MONO#: 0.1 10*3/uL (ref 0.1–0.9)
MONO%: 3 % (ref 0.0–14.0)
NEUT%: 71.7 % (ref 38.4–76.8)
NEUTROS ABS: 1.5 10*3/uL (ref 1.5–6.5)
PLATELETS: 244 10*3/uL (ref 145–400)
RBC: 4.42 10*6/uL (ref 3.70–5.45)
RDW: 15.6 % — AB (ref 11.2–14.5)
WBC: 2 10*3/uL — AB (ref 3.9–10.3)

## 2016-08-29 LAB — COMPREHENSIVE METABOLIC PANEL
ALT: 26 U/L (ref 0–55)
ANION GAP: 11 meq/L (ref 3–11)
AST: 26 U/L (ref 5–34)
Albumin: 3.7 g/dL (ref 3.5–5.0)
Alkaline Phosphatase: 117 U/L (ref 40–150)
BILIRUBIN TOTAL: 0.74 mg/dL (ref 0.20–1.20)
BUN: 22.4 mg/dL (ref 7.0–26.0)
CALCIUM: 10.5 mg/dL — AB (ref 8.4–10.4)
CO2: 21 meq/L — AB (ref 22–29)
CREATININE: 1.1 mg/dL (ref 0.6–1.1)
Chloride: 105 mEq/L (ref 98–109)
EGFR: 50 mL/min/{1.73_m2} — ABNORMAL LOW (ref >90)
Glucose: 219 mg/dl — ABNORMAL HIGH (ref 70–140)
Potassium: 4.3 mEq/L (ref 3.5–5.1)
Sodium: 137 mEq/L (ref 136–145)
TOTAL PROTEIN: 7.6 g/dL (ref 6.4–8.3)

## 2016-08-29 MED ORDER — FAMOTIDINE IN NACL 20-0.9 MG/50ML-% IV SOLN
20.0000 mg | Freq: Once | INTRAVENOUS | Status: AC
  Administered 2016-08-29: 20 mg via INTRAVENOUS

## 2016-08-29 MED ORDER — DEXAMETHASONE SODIUM PHOSPHATE 10 MG/ML IJ SOLN
INTRAMUSCULAR | Status: AC
  Filled 2016-08-29: qty 1

## 2016-08-29 MED ORDER — FAMOTIDINE IN NACL 20-0.9 MG/50ML-% IV SOLN
INTRAVENOUS | Status: AC
  Filled 2016-08-29: qty 50

## 2016-08-29 MED ORDER — SODIUM CHLORIDE 0.9 % IV SOLN
Freq: Once | INTRAVENOUS | Status: AC
  Administered 2016-08-29: 16:00:00 via INTRAVENOUS

## 2016-08-29 MED ORDER — ONDANSETRON HCL 8 MG PO TABS
ORAL_TABLET | ORAL | Status: AC
  Filled 2016-08-29: qty 1

## 2016-08-29 MED ORDER — LORATADINE 10 MG PO TABS
10.0000 mg | ORAL_TABLET | Freq: Every day | ORAL | Status: DC
  Filled 2016-08-29: qty 1

## 2016-08-29 MED ORDER — PACLITAXEL CHEMO INJECTION 300 MG/50ML
80.0000 mg/m2 | Freq: Once | INTRAVENOUS | Status: AC
  Administered 2016-08-29: 150 mg via INTRAVENOUS
  Filled 2016-08-29: qty 25

## 2016-08-29 MED ORDER — DENOSUMAB 120 MG/1.7ML ~~LOC~~ SOLN
120.0000 mg | Freq: Once | SUBCUTANEOUS | Status: AC
  Administered 2016-08-29: 120 mg via SUBCUTANEOUS
  Filled 2016-08-29: qty 1.7

## 2016-08-29 MED ORDER — DEXAMETHASONE SODIUM PHOSPHATE 10 MG/ML IJ SOLN
10.0000 mg | Freq: Once | INTRAMUSCULAR | Status: AC
  Administered 2016-08-29: 10 mg via INTRAVENOUS

## 2016-08-29 MED ORDER — ONDANSETRON HCL 8 MG PO TABS
8.0000 mg | ORAL_TABLET | Freq: Once | ORAL | Status: AC
  Administered 2016-08-29: 8 mg via ORAL

## 2016-08-29 NOTE — Progress Notes (Signed)
Patient Care Team: [name], MD as PCP - General (Internal Medicine)  DIAGNOSIS:  Encounter Diagnosis  Name Primary?  . 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 ([place]) Yes    SUMMARY OF ONCOLOGIC HISTORY:   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 [facility])   1998 Initial Diagnosis    Right breast carcinoma T2N1 ER/PR positive at right mastectomy with node evaluation Dec 1998, treated with adjuvant adriamycin cytoxan followed by 5 years of tamoxifen      2010 Relapse/Recurrence    Metastatic breast cancer to the pleura      08/29/2008 - 08/30/2015 Anti-estrogen oral therapy    Aromasin followed by letrozole      07/2015 Relapse/Recurrence    Progression to bone required aggressive laminectomy February 2017      08/30/2015 - 09/13/2015 Radiation Therapy    Palliative radiation to the spine,  Right ilium ad right hip      01/05/2016 - 04/29/2016 Chemotherapy    Xeloda stopped when she had progression of dsease       05/02/2016 PET scan     innumerable hypermetabolic lesions throughout the axial and appendicular skeleton, increase in size and number of new lesions in skull, right scapular glenoid, throughout sacrum. Findings concerning for liver metastases, numerous pulmonary nodules bilaterally      05/09/2016 -  Chemotherapy    Taxol weekly initially and later changed her day 1 day 8 every 3 weeks (complicated by A. fib with RVR and prolonged respiratory infection) along with Xgeva        CHIEF COMPLIANT: Follow-up of metastatic breast cancer on Taxol Cycle 3 day 15  INTERVAL HISTORY: Lisa [name] is a 76 year old lady with above-mentioned history metastatic breast cancer who is currently on Taxol treatments. Previously she had been on multiple treatments including Xeloda, Aromasin etc. She has been on Taxol since October 2017. She reports some that the side effects of chemotherapy primarily are related to blood counts. She denies any nausea vomiting. Denies any  fevers or chills. She has changed providers from Dr. Marko Plume to myself. She reports that the Taxol she feels fatigued. She is also struggling with taking the steroids. Because of blood sugars to go up. She is also complaining of her high blood pressure issues. She has atrial fibrillation.  REVIEW OF SYSTEMS:   Constitutional: Denies fevers, chills or abnormal weight loss, complains of fatigue Eyes: Denies blurriness of vision Ears, nose, mouth, throat, and face: Denies mucositis or sore throat Respiratory: Denies cough, dyspnea or wheezes Cardiovascular: Denies palpitation, chest discomfort Gastrointestinal:  Denies nausea, heartburn or change in bowel habits Skin: Denies abnormal skin rashes Lymphatics: Denies new lymphadenopathy or easy bruising Neurological:Denies numbness, tingling or new weaknesses Behavioral/Psych: Mood is stable, no new changes  Extremities: Positive lower extremity edema Breast:  denies any pain or lumps or nodules in either breasts All other systems were reviewed with the patient and are negative.  I have reviewed the past medical history, past surgical history, social history and family history with the patient and they are unchanged from previous note.  ALLERGIES:  is allergic to diphenhydramine hcl; codeine sulfate; oseltamivir phosphate; oxycodone-acetaminophen; prednisone; and sudafed [pseudoephedrine hcl].  MEDICATIONS:  Current Outpatient Prescriptions  Medication Sig Dispense Refill  . acetaminophen (TYLENOL) 500 MG tablet Take 500 mg by mouth every 6 (six) hours as needed for moderate pain.    [name] amiodarone (PACERONE) 200 MG tablet Take 1 tablet (200 mg total) by mouth daily. 90 tablet 3  .  apixaban (ELIQUIS) 5 MG TABS tablet Take 1 tablet (5 mg total) by mouth 2 (two) times daily. 180 tablet 3  . dexamethasone (DECADRON) 4 MG tablet Take 4 tabs (16 mg) 12 hours before taxol with food 12 tablet 0  . fluticasone (FLONASE) 50 MCG/ACT nasal spray Place 1  spray into both nostrils 2 (two) times daily. (Patient taking differently: Place 1 spray into both nostrils 2 (two) times daily as needed for allergies. ) 16 g 2  . furosemide (LASIX) 40 MG tablet Take 1 tablet (40 mg total) by mouth daily. 90 tablet 3  . loratadine (CLARITIN) 10 MG tablet Take 10 mg by mouth daily.     . ondansetron (ZOFRAN) 8 MG tablet Take 1 tablet (8 mg total) by mouth every 8 (eight) hours as needed for nausea or vomiting. 20 tablet 2  . potassium chloride (K-DUR) 10 MEQ tablet Take 1 tablet (10 mEq total) by mouth as directed. 90 tablet 3  . pravastatin (PRAVACHOL) 40 MG tablet Take 1 tablet (40 mg total) by mouth every evening. (Patient taking differently: Take 40 mg by mouth every evening. Pt finishing her bottle of simvastatin before switching to pravastatin) 90 tablet 3  . traMADol (ULTRAM) 50 MG tablet Take 50-100 mg by mouth every 8 (eight) hours as needed for moderate pain.    . vitamin B-12 (CYANOCOBALAMIN) 1000 MCG tablet Take 1,000 mcg by mouth 2 (two) times daily.    . vitamin C (ASCORBIC ACID) 500 MG tablet Take 500 mg by mouth daily.      . vitamin E 200 UNIT capsule Take 200 Units by mouth daily.       No current facility-administered medications for this visit.     PHYSICAL EXAMINATION: ECOG PERFORMANCE STATUS: 1 - Symptomatic but completely ambulatory  Vitals:   08/29/16 1425  BP: (!) 179/81  Pulse: 96  Resp: 18  Temp: 98.1 F (36.7 C)   Filed Weights   08/29/16 1425  Weight: 165 lb 1.6 oz (74.9 kg)    GENERAL:alert, no distress and comfortable SKIN: skin color, texture, turgor are normal, no rashes or significant lesions EYES: normal, Conjunctiva are pink and non-injected, sclera clear OROPHARYNX:no exudate, no erythema and lips, buccal mucosa, and tongue normal  NECK: supple, thyroid normal size, non-tender, without nodularity LYMPH:  no palpable lymphadenopathy in the cervical, axillary or inguinal LUNGS: clear to auscultation and  percussion with normal breathing effort HEART: Atrial fibrillation ABDOMEN:abdomen soft, non-tender and normal bowel sounds MUSCULOSKELETAL:no cyanosis of digits and no clubbing  NEURO: alert & oriented x 3 with fluent speech, no focal motor/sensory deficits EXTREMITIES: No lower extremity edema BREAST: No palpable masses or nodules in either right or left breasts. No palpable axillary supraclavicular or infraclavicular adenopathy no breast tenderness or nipple discharge. (exam performed in the presence of a chaperone)  LABORATORY DATA:  I have reviewed the data as listed   Chemistry      Component Value Date/Time   NA 137 08/29/2016 1404   K 4.3 08/29/2016 1404   CL 106 07/25/2016 1457   CL 108 (H) 01/13/2013 0806   CO2 21 (L) 08/29/2016 1404   BUN 22.4 08/29/2016 1404   CREATININE 1.1 08/29/2016 1404   GLU 114 09/12/2015      Component Value Date/Time   CALCIUM 10.5 (H) 08/29/2016 1404   ALKPHOS 117 08/29/2016 1404   AST 26 08/29/2016 1404   ALT 26 08/29/2016 1404   BILITOT 0.74 08/29/2016 1404  Lab Results  Component Value Date   WBC 2.0 (L) 08/29/2016   HGB 12.9 08/29/2016   HCT 39.0 08/29/2016   MCV 88.2 08/29/2016   PLT 244 08/29/2016   NEUTROABS 1.5 08/29/2016    ASSESSMENT & PLAN:  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 [facility]) Metastatic breast cancer tolungs and bone: Clinically improving onweekly taxol beginning 05-09-16, several delays including for counts, rapid A fib, respiratory infection etc  Current treatment: Taxol Today is cycle 3 day 15 Taxol toxicities: 1. Neutropenia: Requiring Neupogen with each treatment  Atrial fibrillation: Recurrent with RVR, cardioverted 07/25/2016, on Eliquis, amiodarone, metoprolol and Lasix for CHF (Dr. Sallyanne Kuster) CKD stage III Pathologic compression fracture T10 status post radiation and laminectomy T6-L1 09/01/2015, radiation to right iliac area 05/2016  05/02/2016 PET CT scan: Progression of disease with  increase in number and size of numerous bone lesions and pulmonary lesions I recommended that we obtain a CT chest abdomen pelvis to assess her cancer. She will have the scan next Friday and I will see her the following Monday to review the scan and decide if she needs to continue Taxol or she could be switched to another therapy.  Return to clinic in 3 weeks for follow-up after scans. I spent 25 minutes talking to the patient of which more than half was spent in counseling and coordination of care.  No orders of the defined types were placed in this encounter.  The patient has a good understanding of the overall plan. she agrees with it. she will call with any problems that may develop before the next visit here.   Rulon Eisenmenger, MD 08/29/16

## 2016-08-29 NOTE — Assessment & Plan Note (Signed)
Metastatic breast cancer tolungs and bone: Clinically improving onweekly taxol beginning 05-09-16, several delays including for counts, rapid A fib, respiratory infection etc  Current treatment: Taxol days 1 and 8 every 3 weeks with Neupogen along with Xgeva Taxol toxicities: 1. Neutropenia: Requiring Neupogen with each treatment  Atrial fibrillation: Recurrent with RVR, cardioverted 07/25/2016, on an liquids, amiodarone, metoprolol and Lasix for CHF (Dr. Sallyanne Kuster) CKD stage III Pathologic compression fracture T10 status post radiation and laminectomy T6-L1 09/01/2015, radiation to right iliac area 05/2016

## 2016-08-29 NOTE — Patient Instructions (Signed)
[facility] Discharge Instructions for Patients Receiving Chemotherapy  Today you received the following chemotherapy agents Taxol.  To help prevent nausea and vomiting after your treatment, we encourage you to take your nausea medication as directed.    If you develop nausea and vomiting that is not controlled by your nausea medication, call the clinic.   BELOW ARE SYMPTOMS THAT SHOULD BE REPORTED IMMEDIATELY:  *FEVER GREATER THAN 100.5 F  *CHILLS WITH OR WITHOUT FEVER  NAUSEA AND VOMITING THAT IS NOT CONTROLLED WITH YOUR NAUSEA MEDICATION  *UNUSUAL SHORTNESS OF BREATH  *UNUSUAL BRUISING OR BLEEDING  TENDERNESS IN MOUTH AND THROAT WITH OR WITHOUT PRESENCE OF ULCERS  *URINARY PROBLEMS  *BOWEL PROBLEMS  UNUSUAL RASH Items with * indicate a potential emergency and should be followed up as soon as possible.  Feel free to call the clinic you have any questions or concerns. The clinic phone number is [phone].    

## 2016-08-30 ENCOUNTER — Ambulatory Visit: Payer: Medicare Other

## 2016-09-05 ENCOUNTER — Other Ambulatory Visit: Payer: Self-pay | Admitting: Emergency Medicine

## 2016-09-05 ENCOUNTER — Telehealth: Payer: Self-pay | Admitting: *Deleted

## 2016-09-05 ENCOUNTER — Other Ambulatory Visit: Payer: Medicare Other

## 2016-09-05 ENCOUNTER — Ambulatory Visit: Payer: Medicare Other

## 2016-09-05 DIAGNOSIS — C782 Secondary malignant neoplasm of pleura: Secondary | ICD-10-CM

## 2016-09-05 DIAGNOSIS — C50911 Malignant neoplasm of unspecified site of right female breast: Secondary | ICD-10-CM

## 2016-09-05 NOTE — Telephone Encounter (Signed)
"  I have not been scheduled for CT scans.  I called radiology, was told there are no orders.  I need this scheduled or need Monday's follow up appointment rescheduled.  Return number [phone]."

## 2016-09-06 ENCOUNTER — Telehealth: Payer: Self-pay | Admitting: Hematology and Oncology

## 2016-09-06 ENCOUNTER — Ambulatory Visit: Payer: Medicare Other

## 2016-09-06 NOTE — Telephone Encounter (Signed)
CT scheduled for 2/19 at 330. She needs appt for Dr Lindi Adie move to 49 or 21. inbasket sent.

## 2016-09-06 NOTE — Telephone Encounter (Addendum)
Pt called again about her CT scan not being ordered. She has an appt for 2/19 for f/u of CT scan, she is cancelling this appt. She will be coming in on 2/22 for her chemo.   S/w Charlestine Massed and ordered CT CAP with contrast. Sent in basket to [facility] to expedite prior auth.

## 2016-09-06 NOTE — Telephone Encounter (Signed)
Prior auth obtained. Called central scheduling and they will contact pt.

## 2016-09-06 NOTE — Telephone Encounter (Signed)
lvm to inform pt of r/s MD appt to after CT scan. Pt has new appt date/time 2/22 at 215 pm

## 2016-09-09 ENCOUNTER — Ambulatory Visit (HOSPITAL_COMMUNITY)
Admission: RE | Admit: 2016-09-09 | Discharge: 2016-09-09 | Disposition: A | Discharge status: Home / Self Care | Payer: Medicare Other | Source: Ambulatory Visit | Attending: Hematology and Oncology | Admitting: Hematology and Oncology

## 2016-09-09 ENCOUNTER — Ambulatory Visit: Payer: Medicare Other | Admitting: Hematology and Oncology

## 2016-09-09 DIAGNOSIS — Z90710 Acquired absence of both cervix and uterus: Secondary | ICD-10-CM | POA: Diagnosis not present

## 2016-09-09 DIAGNOSIS — C7800 Secondary malignant neoplasm of unspecified lung: Secondary | ICD-10-CM | POA: Insufficient documentation

## 2016-09-09 DIAGNOSIS — I2510 Atherosclerotic heart disease of native coronary artery without angina pectoris: Secondary | ICD-10-CM | POA: Insufficient documentation

## 2016-09-09 DIAGNOSIS — Z981 Arthrodesis status: Secondary | ICD-10-CM | POA: Diagnosis not present

## 2016-09-09 DIAGNOSIS — C782 Secondary malignant neoplasm of pleura: Secondary | ICD-10-CM | POA: Insufficient documentation

## 2016-09-09 DIAGNOSIS — K4090 Unilateral inguinal hernia, without obstruction or gangrene, not specified as recurrent: Secondary | ICD-10-CM | POA: Diagnosis not present

## 2016-09-09 DIAGNOSIS — C7951 Secondary malignant neoplasm of bone: Secondary | ICD-10-CM | POA: Insufficient documentation

## 2016-09-09 DIAGNOSIS — I700 Atherosclerosis of aorta: Secondary | ICD-10-CM | POA: Diagnosis not present

## 2016-09-09 DIAGNOSIS — C50911 Malignant neoplasm of unspecified site of right female breast: Secondary | ICD-10-CM

## 2016-09-09 MED ORDER — IOPAMIDOL (ISOVUE-370) INJECTION 76%
INTRAVENOUS | Status: AC
  Filled 2016-09-09: qty 100

## 2016-09-09 MED ORDER — IOPAMIDOL (ISOVUE-300) INJECTION 61%
100.0000 mL | Freq: Once | INTRAVENOUS | Status: AC | PRN
  Administered 2016-09-09: 100 mL via INTRAVENOUS

## 2016-09-09 MED ORDER — SODIUM CHLORIDE 0.9 % IJ SOLN
INTRAMUSCULAR | Status: AC
  Filled 2016-09-09: qty 50

## 2016-09-12 ENCOUNTER — Encounter: Payer: Self-pay | Admitting: Hematology and Oncology

## 2016-09-12 ENCOUNTER — Ambulatory Visit: Payer: Medicare Other

## 2016-09-12 ENCOUNTER — Ambulatory Visit (HOSPITAL_BASED_OUTPATIENT_CLINIC_OR_DEPARTMENT_OTHER): Payer: Medicare Other | Admitting: Hematology and Oncology

## 2016-09-12 ENCOUNTER — Other Ambulatory Visit (HOSPITAL_BASED_OUTPATIENT_CLINIC_OR_DEPARTMENT_OTHER): Payer: Medicare Other

## 2016-09-12 DIAGNOSIS — C7951 Secondary malignant neoplasm of bone: Principal | ICD-10-CM

## 2016-09-12 DIAGNOSIS — N183 Chronic kidney disease, stage 3 (moderate): Secondary | ICD-10-CM | POA: Diagnosis not present

## 2016-09-12 DIAGNOSIS — C782 Secondary malignant neoplasm of pleura: Secondary | ICD-10-CM | POA: Diagnosis not present

## 2016-09-12 DIAGNOSIS — C50911 Malignant neoplasm of unspecified site of right female breast: Secondary | ICD-10-CM | POA: Diagnosis not present

## 2016-09-12 LAB — CBC WITH DIFFERENTIAL/PLATELET
BASO%: 1.6 % (ref 0.0–2.0)
Basophils Absolute: 0 10*3/uL (ref 0.0–0.1)
EOS%: 3.8 % (ref 0.0–7.0)
Eosinophils Absolute: 0.1 10*3/uL (ref 0.0–0.5)
HEMATOCRIT: 35.4 % (ref 34.8–46.6)
HGB: 12 g/dL (ref 11.6–15.9)
LYMPH#: 0.5 10*3/uL — AB (ref 0.9–3.3)
LYMPH%: 30.6 % (ref 14.0–49.7)
MCH: 29.5 pg (ref 25.1–34.0)
MCHC: 33.9 g/dL (ref 31.5–36.0)
MCV: 86.9 fL (ref 79.5–101.0)
MONO#: 0.6 10*3/uL (ref 0.1–0.9)
MONO%: 31.4 % — ABNORMAL HIGH (ref 0.0–14.0)
NEUT%: 32.6 % — AB (ref 38.4–76.8)
NEUTROS ABS: 0.6 10*3/uL — AB (ref 1.5–6.5)
Platelets: 287 10*3/uL (ref 145–400)
RBC: 4.07 10*6/uL (ref 3.70–5.45)
RDW: 16.8 % — AB (ref 11.2–14.5)
WBC: 1.8 10*3/uL — AB (ref 3.9–10.3)

## 2016-09-12 LAB — COMPREHENSIVE METABOLIC PANEL
ALT: 20 U/L (ref 0–55)
AST: 26 U/L (ref 5–34)
Albumin: 3.3 g/dL — ABNORMAL LOW (ref 3.5–5.0)
Alkaline Phosphatase: 84 U/L (ref 40–150)
Anion Gap: 9 mEq/L (ref 3–11)
BUN: 12.6 mg/dL (ref 7.0–26.0)
CALCIUM: 9.2 mg/dL (ref 8.4–10.4)
CO2: 25 meq/L (ref 22–29)
CREATININE: 0.9 mg/dL (ref 0.6–1.1)
Chloride: 107 mEq/L (ref 98–109)
EGFR: 62 mL/min/{1.73_m2} — ABNORMAL LOW (ref >90)
Glucose: 111 mg/dl (ref 70–140)
Potassium: 3.6 mEq/L (ref 3.5–5.1)
Sodium: 141 mEq/L (ref 136–145)
TOTAL PROTEIN: 6.1 g/dL — AB (ref 6.4–8.3)
Total Bilirubin: 0.53 mg/dL (ref 0.20–1.20)

## 2016-09-12 NOTE — Progress Notes (Addendum)
Patient Care Team: [name], MD as PCP - General (Internal Medicine)  DIAGNOSIS:  Encounter Diagnosis  Name Primary?  . 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 ([place])     SUMMARY OF ONCOLOGIC HISTORY:   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 [facility])   1998 Initial Diagnosis    Right breast carcinoma T2N1 ER/PR positive at right mastectomy with node evaluation Dec 1998, treated with adjuvant adriamycin cytoxan followed by 5 years of tamoxifen      2010 Relapse/Recurrence    Metastatic breast cancer to the pleura      08/29/2008 - 08/30/2015 Anti-estrogen oral therapy    Aromasin followed by letrozole      07/2015 Relapse/Recurrence    Progression to bone required aggressive laminectomy February 2017      08/30/2015 - 09/13/2015 Radiation Therapy    Palliative radiation to the spine,  Right ilium ad right hip      01/05/2016 - 04/29/2016 Chemotherapy    Xeloda stopped when she had progression of dsease       05/02/2016 PET scan     innumerable hypermetabolic lesions throughout the axial and appendicular skeleton, increase in size and number of new lesions in skull, right scapular glenoid, throughout sacrum. Findings concerning for liver metastases, numerous pulmonary nodules bilaterally      05/09/2016 -  Chemotherapy    Taxol weekly initially and later changed her day 1 day 8 every 3 weeks (complicated by A. fib with RVR and prolonged respiratory infection) along with Xgeva        CHIEF COMPLIANT: Follow-up to review CT scans  INTERVAL HISTORY: Lisa [name] is a 76 year old with above-mentioned history of metastatic breast cancer currently on Taxol chemotherapy. She was previously seen by Dr. Marko Plume and is here to continue her treatment today. She had a recent CT scan and is here to discuss the results. She is accompanied by her mother and father. Overall she complains of fatigue related to chemotherapy for 2-3 days with the treatment.  REVIEW OF SYSTEMS:     Constitutional: Denies fevers, chills or abnormal weight loss Eyes: Denies blurriness of vision Ears, nose, mouth, throat, and face: Denies mucositis or sore throat Respiratory: Denies cough, dyspnea or wheezes Cardiovascular: Denies palpitation, chest discomfort Gastrointestinal:  Denies nausea, heartburn or change in bowel habits Skin: Denies abnormal skin rashes Lymphatics: Denies new lymphadenopathy or easy bruising Neurological:Denies numbness, tingling or new weaknesses Behavioral/Psych: Mood is stable, no new changes  Extremities: No lower extremity edema Breast:  denies any pain or lumps or nodules in either breasts All other systems were reviewed with the patient and are negative.  I have reviewed the past medical history, past surgical history, social history and family history with the patient and they are unchanged from previous note.  ALLERGIES:  is allergic to diphenhydramine hcl; codeine sulfate; oseltamivir phosphate; oxycodone-acetaminophen; prednisone; and sudafed [pseudoephedrine hcl].  MEDICATIONS:  Current Outpatient Prescriptions  Medication Sig Dispense Refill  . acetaminophen (TYLENOL) 500 MG tablet Take 500 mg by mouth every 6 (six) hours as needed for moderate pain.    [name] amiodarone (PACERONE) 200 MG tablet Take 1 tablet (200 mg total) by mouth daily. 90 tablet 3  . apixaban (ELIQUIS) 5 MG TABS tablet Take 1 tablet (5 mg total) by mouth 2 (two) times daily. 180 tablet 3  . dexamethasone (DECADRON) 4 MG tablet Take 4 tabs (16 mg) 12 hours before taxol with food 12 tablet 0  . fluticasone (FLONASE) 50 MCG/ACT nasal  spray Place 1 spray into both nostrils 2 (two) times daily. (Patient taking differently: Place 1 spray into both nostrils 2 (two) times daily as needed for allergies. ) 16 g 2  . furosemide (LASIX) 40 MG tablet Take 1 tablet (40 mg total) by mouth daily. 90 tablet 3  . loratadine (CLARITIN) 10 MG tablet Take 10 mg by mouth daily.     . ondansetron  (ZOFRAN) 8 MG tablet Take 1 tablet (8 mg total) by mouth every 8 (eight) hours as needed for nausea or vomiting. 20 tablet 2  . potassium chloride (K-DUR) 10 MEQ tablet Take 1 tablet (10 mEq total) by mouth as directed. 90 tablet 3  . pravastatin (PRAVACHOL) 40 MG tablet Take 1 tablet (40 mg total) by mouth every evening. (Patient taking differently: Take 40 mg by mouth every evening. Pt finishing her bottle of simvastatin before switching to pravastatin) 90 tablet 3  . traMADol (ULTRAM) 50 MG tablet Take 50-100 mg by mouth every 8 (eight) hours as needed for moderate pain.    . vitamin B-12 (CYANOCOBALAMIN) 1000 MCG tablet Take 1,000 mcg by mouth 2 (two) times daily.    . vitamin C (ASCORBIC ACID) 500 MG tablet Take 500 mg by mouth daily.      . vitamin E 200 UNIT capsule Take 200 Units by mouth daily.       No current facility-administered medications for this visit.     PHYSICAL EXAMINATION: ECOG PERFORMANCE STATUS: 1 - Symptomatic but completely ambulatory  Vitals:   09/12/16 1433 09/12/16 1531  BP: (!) 186/85 139/66  Pulse: 84   Resp: 18   Temp: 98.2 F (36.8 C)    Filed Weights   09/12/16 1433  Weight: 165 lb 12.8 oz (75.2 kg)    GENERAL:alert, no distress and comfortable SKIN: skin color, texture, turgor are normal, no rashes or significant lesions EYES: normal, Conjunctiva are pink and non-injected, sclera clear OROPHARYNX:no exudate, no erythema and lips, buccal mucosa, and tongue normal  NECK: supple, thyroid normal size, non-tender, without nodularity LYMPH:  no palpable lymphadenopathy in the cervical, axillary or inguinal LUNGS: clear to auscultation and percussion with normal breathing effort HEART: regular rate & rhythm and no murmurs and no lower extremity edema ABDOMEN:abdomen soft, non-tender and normal bowel sounds MUSCULOSKELETAL:no cyanosis of digits and no clubbing  NEURO: alert & oriented x 3 with fluent speech, no focal motor/sensory deficits EXTREMITIES:  No lower extremity edema  LABORATORY DATA:  I have reviewed the data as listed   Chemistry      Component Value Date/Time   NA 141 09/12/2016 1418   K 3.6 09/12/2016 1418   CL 106 07/25/2016 1457   CL 108 (H) 01/13/2013 0806   CO2 25 09/12/2016 1418   BUN 12.6 09/12/2016 1418   CREATININE 0.9 09/12/2016 1418   GLU 114 09/12/2015      Component Value Date/Time   CALCIUM 9.2 09/12/2016 1418   ALKPHOS 84 09/12/2016 1418   AST 26 09/12/2016 1418   ALT 20 09/12/2016 1418   BILITOT 0.53 09/12/2016 1418       Lab Results  Component Value Date   WBC 1.8 (L) 09/12/2016   HGB 12.0 09/12/2016   HCT 35.4 09/12/2016   MCV 86.9 09/12/2016   PLT 287 09/12/2016   NEUTROABS 0.6 (L) 09/12/2016    ASSESSMENT & PLAN:  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 ([facility]) Metastatic breast cancer tolungs and bone: Clinically improving onweekly taxol beginning 05-09-16, several delays including   for counts, rapid A fib, respiratory infection etc  Current treatment: Taxol days 1 and 8 every 3 weeks with Neupogen along with Xgeva; completed 3 cycles Taxol toxicities: 1. Neutropenia: Requiring Neupogen with each treatment  Atrial fibrillation: Recurrent with RVR, cardioverted 07/25/2016, on an liquids, amiodarone, metoprolol and Lasix for CHF (Dr. Sallyanne Kuster) CKD stage III Pathologic compression fracture T10 status post radiation and laminectomy T6-L1 09/01/2015, radiation to right iliac area 05/2016  CT chest abdomen pelvis: Stable appearance of the lung mass 5.6 cm and 2.1 cm along with left lingular infiltrate and stable appearing bone metastases. I discussed with her that we need to obtain a biopsy of the lung nodule and retest for ER/PR and HER-2/neu receptors. If it is ER PR positive then I might consider switching her from Taxol to [place] with letrozole. I would like to see the patient 3 days after biopsy. I will discontinue further chemotherapy.  Grade 3 neutropenia: Discussed neutropenic  precautions.  I spent 25 minutes talking to the patient of which more than half was spent in counseling and coordination of care.  No orders of the defined types were placed in this encounter.  The patient has a good understanding of the overall plan. she agrees with it. she will call with any problems that may develop before the next visit here.   Rulon Eisenmenger, MD 09/12/16  Addendum: Patient needs a biopsy of the lung and she is currently on Eliquis for atrial fibrillation. It is acceptable to stop Eliquis for 2 days prior to the biopsy.

## 2016-09-12 NOTE — Assessment & Plan Note (Signed)
Metastatic breast cancer tolungs and bone: Clinically improving onweekly taxol beginning 05-09-16, several delays including for counts, rapid A fib, respiratory infection etc  Current treatment: Taxol days 1 and 8 every 3 weeks with Neupogen along with Xgeva; completed 3 cycles Taxol toxicities: 1. Neutropenia: Requiring Neupogen with each treatment  Atrial fibrillation: Recurrent with RVR, cardioverted 07/25/2016, on an liquids, amiodarone, metoprolol and Lasix for CHF (Dr. Sallyanne Kuster) CKD stage III Pathologic compression fracture T10 status post radiation and laminectomy T6-L1 09/01/2015, radiation to right iliac area 05/2016

## 2016-09-13 ENCOUNTER — Telehealth: Payer: Self-pay

## 2016-09-13 ENCOUNTER — Ambulatory Visit: Payer: Medicare Other

## 2016-09-13 NOTE — Telephone Encounter (Signed)
Pt called to verify if she needed a injection today to help with her low count anc 0.6. Told pt that per Dr.Gudena's note, pt to hold off on chemotherapy until results from biopsy is discussed. Pt will be seen back in the office 3 days after biopsy. Discussed and reinforced neutropenic precautions and pt verbalized understanding. If pt continues chemo therapy, pt will need to get neupogen with each treatment, per MD note. Pt states that Dr.Gudena told her that she will be contacted for her biopsy within the next 10 days. Pt denies any fevers, chills, or fatigue at this time and wants to clarify plan of care. Pt will call for further questions or concerns.

## 2016-09-17 ENCOUNTER — Other Ambulatory Visit: Payer: Self-pay | Admitting: Radiology

## 2016-09-18 ENCOUNTER — Other Ambulatory Visit: Payer: Self-pay | Admitting: Radiology

## 2016-09-19 ENCOUNTER — Ambulatory Visit (HOSPITAL_COMMUNITY)
Admission: RE | Admit: 2016-09-19 | Discharge: 2016-09-19 | Disposition: A | Discharge status: Home / Self Care | Payer: Medicare Other | Source: Ambulatory Visit | Attending: Hematology and Oncology | Admitting: Hematology and Oncology

## 2016-09-19 ENCOUNTER — Encounter (HOSPITAL_COMMUNITY): Payer: Self-pay

## 2016-09-19 DIAGNOSIS — I129 Hypertensive chronic kidney disease with stage 1 through stage 4 chronic kidney disease, or unspecified chronic kidney disease: Secondary | ICD-10-CM | POA: Insufficient documentation

## 2016-09-19 DIAGNOSIS — I481 Persistent atrial fibrillation: Secondary | ICD-10-CM | POA: Diagnosis not present

## 2016-09-19 DIAGNOSIS — Z90710 Acquired absence of both cervix and uterus: Secondary | ICD-10-CM | POA: Insufficient documentation

## 2016-09-19 DIAGNOSIS — I2510 Atherosclerotic heart disease of native coronary artery without angina pectoris: Secondary | ICD-10-CM | POA: Insufficient documentation

## 2016-09-19 DIAGNOSIS — G8929 Other chronic pain: Secondary | ICD-10-CM | POA: Insufficient documentation

## 2016-09-19 DIAGNOSIS — I700 Atherosclerosis of aorta: Secondary | ICD-10-CM | POA: Insufficient documentation

## 2016-09-19 DIAGNOSIS — N183 Chronic kidney disease, stage 3 (moderate): Secondary | ICD-10-CM | POA: Diagnosis not present

## 2016-09-19 DIAGNOSIS — K4090 Unilateral inguinal hernia, without obstruction or gangrene, not specified as recurrent: Secondary | ICD-10-CM | POA: Diagnosis not present

## 2016-09-19 DIAGNOSIS — Z9889 Other specified postprocedural states: Secondary | ICD-10-CM | POA: Diagnosis not present

## 2016-09-19 DIAGNOSIS — J90 Pleural effusion, not elsewhere classified: Secondary | ICD-10-CM | POA: Diagnosis not present

## 2016-09-19 DIAGNOSIS — C7951 Secondary malignant neoplasm of bone: Secondary | ICD-10-CM | POA: Diagnosis not present

## 2016-09-19 DIAGNOSIS — Z87891 Personal history of nicotine dependence: Secondary | ICD-10-CM | POA: Diagnosis not present

## 2016-09-19 DIAGNOSIS — C50911 Malignant neoplasm of unspecified site of right female breast: Secondary | ICD-10-CM | POA: Diagnosis not present

## 2016-09-19 DIAGNOSIS — C782 Secondary malignant neoplasm of pleura: Secondary | ICD-10-CM | POA: Insufficient documentation

## 2016-09-19 LAB — CBC WITH DIFFERENTIAL/PLATELET
BASOS PCT: 1 %
Basophils Absolute: 0 10*3/uL (ref 0.0–0.1)
EOS ABS: 0.1 10*3/uL (ref 0.0–0.7)
EOS PCT: 2 %
HEMATOCRIT: 35.6 % — AB (ref 36.0–46.0)
Hemoglobin: 12.1 g/dL (ref 12.0–15.0)
Lymphocytes Relative: 18 %
Lymphs Abs: 0.8 10*3/uL (ref 0.7–4.0)
MCH: 28.7 pg (ref 26.0–34.0)
MCHC: 34 g/dL (ref 30.0–36.0)
MCV: 84.4 fL (ref 78.0–100.0)
MONO ABS: 0.8 10*3/uL (ref 0.1–1.0)
MONOS PCT: 17 %
Neutro Abs: 2.9 10*3/uL (ref 1.7–7.7)
Neutrophils Relative %: 62 %
PLATELETS: 227 10*3/uL (ref 150–400)
RBC: 4.22 MIL/uL (ref 3.87–5.11)
RDW: 15.6 % — AB (ref 11.5–15.5)
WBC: 4.6 10*3/uL (ref 4.0–10.5)

## 2016-09-19 LAB — PROTIME-INR
INR: 0.99
PROTHROMBIN TIME: 13.1 s (ref 11.4–15.2)

## 2016-09-19 MED ORDER — MIDAZOLAM HCL 2 MG/2ML IJ SOLN
INTRAMUSCULAR | Status: AC
  Filled 2016-09-19: qty 6

## 2016-09-19 MED ORDER — SODIUM CHLORIDE 0.9 % IV SOLN
INTRAVENOUS | Status: DC
  Administered 2016-09-19: 08:00:00 via INTRAVENOUS

## 2016-09-19 MED ORDER — ONDANSETRON HCL 4 MG/2ML IJ SOLN
4.0000 mg | Freq: Once | INTRAMUSCULAR | Status: AC
  Administered 2016-09-19: 4 mg via INTRAVENOUS
  Filled 2016-09-19: qty 2

## 2016-09-19 MED ORDER — FENTANYL CITRATE (PF) 100 MCG/2ML IJ SOLN
INTRAMUSCULAR | Status: AC | PRN
  Administered 2016-09-19 (×2): 50 ug via INTRAVENOUS

## 2016-09-19 MED ORDER — NALOXONE HCL 0.4 MG/ML IJ SOLN
INTRAMUSCULAR | Status: AC
  Filled 2016-09-19: qty 1

## 2016-09-19 MED ORDER — MIDAZOLAM HCL 2 MG/2ML IJ SOLN
INTRAMUSCULAR | Status: AC | PRN
  Administered 2016-09-19 (×2): 1 mg via INTRAVENOUS

## 2016-09-19 MED ORDER — FLUMAZENIL 0.5 MG/5ML IV SOLN
INTRAVENOUS | Status: AC
  Filled 2016-09-19: qty 5

## 2016-09-19 MED ORDER — FENTANYL CITRATE (PF) 100 MCG/2ML IJ SOLN
INTRAMUSCULAR | Status: AC
  Filled 2016-09-19: qty 6

## 2016-09-19 NOTE — Consult Note (Signed)
Chief Complaint: Patient was seen in consultation today for CT guided right pleural-based mass biopsy  Referring Physician(s): Gudena,Vinay  Supervising Physician: Arne Cleveland  Patient Status: [place]  History of Present Illness: Lisa [name] is a 76 y.o. female with history of metastatic breast cancer and prior surgery/chemoradiation. Recent imaging has continued to show right pleural and bilateral pulmonary metastatic disease with extensive multifocal osseous metastatic disease. She presents today for CT-guided right pleural-based mass biopsy to retest for ER/PR and HER-2/neu receptors to direct future therapy.  Past Medical History:  Diagnosis Date  . Breast cancer ([place]) 07-13-1997   right  . Chronic back pain   . CKD (chronic kidney disease), stage III   . Complication of anesthesia   . Coronary artery calcification of native artery    a. seen on PET scan 04/2016.  [name] Hypertension   . Pericardial effusion    a. small by TEE 05/2016.  [name] Persistent atrial fibrillation ([place])   . Pleural effusion 11-08-2008  . PONV (postoperative nausea and vomiting)   . Radiation 08/17/2015-08/29/2015   lower thoracic spine 22.5 gray  . Radiation 01/17/16-01/24/16   right femur 20Gy    Past Surgical History:  Procedure Laterality Date  . ABDOMINAL HYSTERECTOMY  02-21-1982  . BREAST BIOPSY  07-13-1997  . CARDIOVERSION N/A 05/27/2016   Procedure: CARDIOVERSION;  Surgeon: [name], MD;  Location: [facility];  Service: Cardiovascular;  Laterality: N/A;  . DILATION AND CURETTAGE, DIAGNOSTIC / THERAPEUTIC  12-13-1981  . MASTECTOMY  07-29-1997  . PELVIC LAPAROSCOPY  11-06-1981  . POSTERIOR LUMBAR FUSION 4 LEVEL N/A 09/01/2015   Procedure: Thoracic six-Lumbar one fusion with arthrodesis pedicle screw stabilization and decompression;  Surgeon: Ashok Pall, MD;  Location: [facility];  Service: Neurosurgery;  Laterality: N/A;  T6-L1 fusion with arthrodesis pedicle screw stabilization  and decompression  . TEE WITHOUT CARDIOVERSION N/A 05/27/2016   Procedure: TRANSESOPHAGEAL ECHOCARDIOGRAM (TEE);  Surgeon: [name], MD;  Location: [facility] END[facility];  Service: Cardiovascular;  Laterality: N/A;  . THORACENTESIS  10-18-2008    Allergies: Diphenhydramine hcl; Codeine sulfate; Oseltamivir phosphate; Oxycodone-acetaminophen; Prednisone; and Sudafed [pseudoephedrine hcl]  Medications: Prior to Admission medications   Medication Sig Start Date End Date Taking? Authorizing Provider  vitamin E 200 UNIT capsule Take 200 Units by mouth daily.     Yes Historical Provider, MD  acetaminophen (TYLENOL) 500 MG tablet Take 500 mg by mouth every 6 (six) hours as needed for moderate pain.    Historical Provider, MD  amiodarone (PACERONE) 200 MG tablet Take 1 tablet (200 mg total) by mouth daily. 07/29/16   Mihai Croitoru, MD  apixaban (ELIQUIS) 5 MG TABS tablet Take 1 tablet (5 mg total) by mouth 2 (two) times daily. 07/29/16   Mihai Croitoru, MD  dexamethasone (DECADRON) 4 MG tablet Take 4 tabs (16 mg) 12 hours before taxol with food 07/12/16   Lennis P Livesay, MD  fluticasone (FLONASE) 50 MCG/ACT nasal spray Place 1 spray into both nostrils 2 (two) times daily. Patient taking differently: Place 1 spray into both nostrils 2 (two) times daily as needed for allergies.  05/31/16   Fillmore, DO  furosemide (LASIX) 40 MG tablet Take 1 tablet (40 mg total) by mouth daily. 08/09/16   Leonie Man, MD  loratadine (CLARITIN) 10 MG tablet Take 10 mg by mouth daily.     Historical Provider, MD  ondansetron (ZOFRAN) 8 MG tablet Take 1 tablet (8 mg total) by mouth every  8 (eight) hours as needed for nausea or vomiting. 01/08/16   Lennis Marion Downer, MD  potassium chloride (K-DUR) 10 MEQ tablet Take 1 tablet (10 mEq total) by mouth as directed. 07/29/16   Mihai Croitoru, MD  pravastatin (PRAVACHOL) 40 MG tablet Take 1 tablet (40 mg total) by mouth every evening. Patient taking differently: Take 40 mg by  mouth every evening. Pt finishing her bottle of simvastatin before switching to pravastatin 07/29/16 10/27/16  Mihai Croitoru, MD  traMADol (ULTRAM) 50 MG tablet Take 50-100 mg by mouth every 8 (eight) hours as needed for moderate pain.    Historical Provider, MD  vitamin B-12 (CYANOCOBALAMIN) 1000 MCG tablet Take 1,000 mcg by mouth 2 (two) times daily.    Historical Provider, MD  vitamin C (ASCORBIC ACID) 500 MG tablet Take 500 mg by mouth daily.      Historical Provider, MD     Family History  Problem Relation Age of Onset  . Heart disease Mother     with pacemaker   . Asthma Father   . Heart attack Maternal Grandfather 64  . Diabetes Paternal Grandmother   . Aortic stenosis Maternal Uncle   . Prostate cancer Maternal Uncle     dx unspecified age  . Prostate cancer Maternal Uncle     dx older than 50y  . Prostate cancer Maternal Uncle     dx older than 50y  . Prostate cancer Maternal Uncle 72    s/p prostatectomy  . Breast cancer Cousin 24    maternal 1st cousin; s/p lumpectomy and radiation  . Breast cancer Cousin     maternal 1st cousin dx mid-late 68s; s/p mastectomy    Social History   Social History  . Marital status: Single    Spouse name: N/A  . Number of children: 0  . Years of education: N/A   Occupational History  . Administraton for [employer]    Social History Main Topics  . Smoking status: Former Smoker    Packs/day: 0.50    Years: 30.00    Types: Cigarettes    Quit date: 07/22/1996  . Smokeless tobacco: Never Used     Comment: 1/2 up to 1 ppd  . Alcohol use No     Comment: hx of alcohol socially - hasn't had drink since March 1998  . Drug use: No  . Sexual activity: Not Asked   Other Topics Concern  . None   Social History Narrative  . None      Review of Systems currently denies fever, headache, dyspnea, cough, abdominal pain, nausea, vomiting or abnormal bleeding. She does have some right lateral chest discomfort.  Vital  Signs: BP (!) 152/81 (BP Location: Left Arm)   Pulse 83   Temp 97.1 F (36.2 C) (Oral)   Resp 18   SpO2 97%   Physical Exam awake, alert. Chest clear to auscultation bilaterally. Heart with regular rate and rhythm. Abdomen soft, positive bowel sounds, nontender. Lower extremities no significant edema  Mallampati Score:     Imaging: Ct Chest W Contrast  Result Date: 09/09/2016 CLINICAL DATA:  Follow-up metastatic right breast cancer with metastatic disease to the pleura. Right breast cancer initially diagnosed in 1998 with mastectomy and adjuvant chemotherapy. Relapse in 2010 treated with Taxol. EXAM: CT CHEST, ABDOMEN, AND PELVIS WITH CONTRAST TECHNIQUE: Multidetector CT imaging of the chest, abdomen and pelvis was performed following the standard protocol during bolus administration of intravenous contrast. CONTRAST:  174m ISOVUE-300 IOPAMIDOL (ISOVUE-300)  INJECTION 61% COMPARISON:  PET-CT 05/02/2016.  Chest CT 11/20/2015. FINDINGS: CT CHEST FINDINGS Cardiovascular: Suboptimal contrast bolus. There is atherosclerosis of the aorta, great vessels and coronary arteries. No acute vascular findings are evident. The heart size is normal. There is no pericardial effusion. Mediastinum/Nodes: Surgical clips are present within the right axilla. There are no enlarged axillary, internal mammary, mediastinal or hilar lymph nodes. The thyroid gland, trachea and esophagus demonstrate no significant findings. Lungs/Pleura: Stable small left-greater-than-right pleural effusions. The large partially calcified subpleural mass in the right lower lobe is grossly stable, measuring up to 5.6 x 4.9 cm on image 236. This was hypermetabolic on PET-CT. 2 cm nodule in the right major fissure on image 67 is stable, also hypermetabolic on previous PET-CT. Ill-defined mass or infiltrate within the lingula is not easily measured, although does not appear progressive. No definite enlarging nodules. Musculoskeletal/Chest wall:  Multiple lytic lesions are again noted throughout the spine, ribs and right scapula, similar to previous PET-CT. This does appear progressive compared with the older chest CT, especially within the right scapula. Patient is status post mid thoracic laminectomy with posterolateral fusion from T6 through L1. The hardware appears intact. Compression fracture and osseous retropulsion at T10 does not appear progressive. CT ABDOMEN AND PELVIS FINDINGS Hepatobiliary: The liver appears homogeneous attenuation without focal lesion or suspicious enhancement. No evidence of gallstones, gallbladder wall thickening or biliary dilatation. Pancreas: Unremarkable. No pancreatic ductal dilatation or surrounding inflammatory changes. Spleen: Normal in size without focal abnormality. Adrenals/Urinary Tract: Both adrenal glands appear normal. There is a parapelvic cyst in the lower pole of the left kidney. No evidence of enhancing renal mass or hydronephrosis. A small amount of gas is present within the bladder lumen. No focal bladder lesion identified. Stomach/Bowel: No evidence of bowel wall thickening, distention or surrounding inflammatory change. Vascular/Lymphatic: There are no enlarged abdominal or pelvic lymph nodes. Mild aortic and branch vessel atherosclerosis. Reproductive: Hysterectomy. No evidence of adnexal mass. There is probably some residual ovarian tissue bilaterally which appears unchanged. Other: No ascites or peritoneal nodularity. There is a small left inguinal hernia containing only fat. Musculoskeletal: Lytic osseous metastatic disease is grossly stable compared with PET-CT, with the largest lesions in the L2 vertebral body, upper sacrum and right iliac crest. No significant progression identified. IMPRESSION: 1. Compared with PET-CT of 4 months ago, the right pleural and bilateral pulmonary metastatic disease does not appear progressive. No enlarged mediastinal lymph nodes are seen. 2. Extensive multifocal  osseous metastatic disease also appears grossly stable from PET-CT, although the involvement of the right scapula has progressed over the last 9 months. Spinal involvement appears grossly stable. 3. No definite hepatic or other abdominal metastatic disease. Electronically Signed   By: Richardean Sale M.D.   On: 09/09/2016 16:22   Ct Abdomen Pelvis W Contrast  Result Date: 09/09/2016 CLINICAL DATA:  Follow-up metastatic right breast cancer with metastatic disease to the pleura. Right breast cancer initially diagnosed in 1998 with mastectomy and adjuvant chemotherapy. Relapse in 2010 treated with Taxol. EXAM: CT CHEST, ABDOMEN, AND PELVIS WITH CONTRAST TECHNIQUE: Multidetector CT imaging of the chest, abdomen and pelvis was performed following the standard protocol during bolus administration of intravenous contrast. CONTRAST:  143m ISOVUE-300 IOPAMIDOL (ISOVUE-300) INJECTION 61% COMPARISON:  PET-CT 05/02/2016.  Chest CT 11/20/2015. FINDINGS: CT CHEST FINDINGS Cardiovascular: Suboptimal contrast bolus. There is atherosclerosis of the aorta, great vessels and coronary arteries. No acute vascular findings are evident. The heart size is normal. There is no pericardial effusion.  Mediastinum/Nodes: Surgical clips are present within the right axilla. There are no enlarged axillary, internal mammary, mediastinal or hilar lymph nodes. The thyroid gland, trachea and esophagus demonstrate no significant findings. Lungs/Pleura: Stable small left-greater-than-right pleural effusions. The large partially calcified subpleural mass in the right lower lobe is grossly stable, measuring up to 5.6 x 4.9 cm on image 236. This was hypermetabolic on PET-CT. 2 cm nodule in the right major fissure on image 67 is stable, also hypermetabolic on previous PET-CT. Ill-defined mass or infiltrate within the lingula is not easily measured, although does not appear progressive. No definite enlarging nodules. Musculoskeletal/Chest wall: Multiple  lytic lesions are again noted throughout the spine, ribs and right scapula, similar to previous PET-CT. This does appear progressive compared with the older chest CT, especially within the right scapula. Patient is status post mid thoracic laminectomy with posterolateral fusion from T6 through L1. The hardware appears intact. Compression fracture and osseous retropulsion at T10 does not appear progressive. CT ABDOMEN AND PELVIS FINDINGS Hepatobiliary: The liver appears homogeneous attenuation without focal lesion or suspicious enhancement. No evidence of gallstones, gallbladder wall thickening or biliary dilatation. Pancreas: Unremarkable. No pancreatic ductal dilatation or surrounding inflammatory changes. Spleen: Normal in size without focal abnormality. Adrenals/Urinary Tract: Both adrenal glands appear normal. There is a parapelvic cyst in the lower pole of the left kidney. No evidence of enhancing renal mass or hydronephrosis. A small amount of gas is present within the bladder lumen. No focal bladder lesion identified. Stomach/Bowel: No evidence of bowel wall thickening, distention or surrounding inflammatory change. Vascular/Lymphatic: There are no enlarged abdominal or pelvic lymph nodes. Mild aortic and branch vessel atherosclerosis. Reproductive: Hysterectomy. No evidence of adnexal mass. There is probably some residual ovarian tissue bilaterally which appears unchanged. Other: No ascites or peritoneal nodularity. There is a small left inguinal hernia containing only fat. Musculoskeletal: Lytic osseous metastatic disease is grossly stable compared with PET-CT, with the largest lesions in the L2 vertebral body, upper sacrum and right iliac crest. No significant progression identified. IMPRESSION: 1. Compared with PET-CT of 4 months ago, the right pleural and bilateral pulmonary metastatic disease does not appear progressive. No enlarged mediastinal lymph nodes are seen. 2. Extensive multifocal osseous  metastatic disease also appears grossly stable from PET-CT, although the involvement of the right scapula has progressed over the last 9 months. Spinal involvement appears grossly stable. 3. No definite hepatic or other abdominal metastatic disease. Electronically Signed   By: Richardean Sale M.D.   On: 09/09/2016 16:22    Labs:  CBC:  Recent Labs  08/15/16 1412 08/29/16 1404 09/12/16 1418 09/19/16 0732  WBC 6.6 2.0* 1.8* 4.6  HGB 13.0 12.9 12.0 12.1  HCT 38.9 39.0 35.4 35.6*  PLT 299 244 287 227    COAGS:  Recent Labs  09/19/16 0732  INR 0.99    BMP:  Recent Labs  05/29/16 0453 05/30/16 0452 05/31/16 0350  07/25/16 1457 08/01/16 1131 08/15/16 1412 08/29/16 1404 09/12/16 1418  NA 140 139 140  < > 134* 139 139 137 141  K 3.3* 4.2 3.8  < > 4.4 4.2 4.0 4.3 3.6  CL 108 107 110  --  106  --   --   --   --   CO2 26 24 25   < > 19* 20* 19* 21* 25  GLUCOSE 102* 104* 100*  < > 140* 232* 187* 219* 111  BUN 34* 29* 24*  < > 23* 18.7 25.9 22.4 12.6  CALCIUM 10.5*  11.1* 11.0*  < > 10.0 9.2 10.5* 10.5* 9.2  CREATININE 1.28* 1.09* 1.25*  < > 1.13* 1.0 1.2* 1.1 0.9  GFRNONAA 41* 49* 42*  --  47*  --   --   --   --   GFRAA 47* 57* 49*  --  54*  --   --   --   --   < > = values in this interval not displayed.  LIVER FUNCTION TESTS:  Recent Labs  08/01/16 1131 08/15/16 1412 08/29/16 1404 09/12/16 1418  BILITOT 0.80 0.74 0.74 0.53  AST 20 30 26 26   ALT 17 26 26 20   ALKPHOS 88 98 117 84  PROT 6.0* 7.4 7.6 6.1*  ALBUMIN 2.9* 3.6 3.7 3.3*    TUMOR MARKERS: No results for input(s): AFPTM, CEA, CA199, CHROMGRNA in the last 8760 hours.  Assessment and Plan:  76 y.o. female with history of metastatic breast cancer and prior surgery/chemoradiation. Recent imaging has continued to show right pleural and bilateral pulmonary metastatic disease with extensive multifocal osseous metastatic disease. She presents today for CT-guided right pleural-based mass biopsy to retest for  ER/PR and HER-2/neu receptors to direct future therapy.Risks and benefits discussed with the patient/family including, but not limited to bleeding, infection, damage to adjacent structures or low yield requiring additional tests.All of the patient's questions were answered, patient is agreeable to proceed. Consent signed and in chart.      Thank you for this interesting consult.  I greatly enjoyed meeting Lisa [name] and look forward to participating in their care.  A copy of this report was sent to the requesting provider on this date.  Electronically Signed: D. Rowe Robert 09/19/2016, 8:44 AM   I spent a total of  25 minutes   in face to face in clinical consultation, greater than 50% of which was counseling/coordinating care for CT-guided right pleural-based mass biopsy

## 2016-09-19 NOTE — Procedures (Signed)
CT core R pleural mass [id number] x6 No complication No blood loss. See complete dictation in [facility].

## 2016-09-19 NOTE — Discharge Instructions (Signed)
Needle Biopsy of the Lung, Care After °This sheet gives you information about how to care for yourself after your procedure. Your health care provider may also give you more specific instructions. If you have problems or questions, contact your health care provider. °What can I expect after the procedure? °After the procedure, it is common to have: °· Soreness, pain, and tenderness where a tissue sample was taken (biopsy site). °· A cough. °· A sore throat. °Follow these instructions at home: °Biopsy site care  °· Follow instructions from your health care provider about when to remove the bandage that was placed on the biopsy site. °· Keep the bandage dry until it has been removed. °· Check your biopsy site every day for signs of infection. Check for: °¨ More redness, swelling, or pain. °¨ More fluid or blood. °¨ Warmth to the touch. °¨ Pus or a bad smell. °General instructions  °· Rest as directed by your health care provider. Ask your health care provider what activities are safe for you. °· Do not take baths, swim, or use a hot tub until your health care provider approves. °· Take over-the-counter and prescription medicines only as told by your health care provider. °· If you have airplane travel scheduled, talk with your health care provider about when it is safe for you to travel by airplane. °· It is up to you to get the results of your procedure. Ask your health care provider, or the department that is doing the procedure, when your results will be ready. °· Keep all follow-up visits as told by your health care provider. This is important. °Contact a health care provider if: °· You have more redness, swelling, or pain around your biopsy site. °· You have more fluid or blood coming from your biopsy site. °· Your biopsy site feels warm to the touch. °· You have pus or a bad smell coming from your biopsy site. °· You have a fever. °· You have pain that does not get better with medicine. °Get help right away  if: °· You have problems breathing. °· You have chest pain. °· You cough up blood. °· You faint. °· You have a fast heart rate. °Summary °· After a needle biopsy of the lung, it is common to have a cough, a sore throat, or soreness, pain, and tenderness where a tissue sample was taken (biopsy site). °· You should check your biopsy area every day for signs of infection, including pus or a bad smell, warmth, more fluid or blood, or more redness, swelling, or pain. °· You should not take baths, swim, or use a hot tub until your health care provider approves. °· It is up to you to get the results of your procedure. Ask your health care provider, or the department that is doing the procedure, when your results will be ready. °This information is not intended to replace advice given to you by your health care provider. Make sure you discuss any questions you have with your health care provider. °Document Released: 05/05/2007 Document Revised: 05/29/2016 Document Reviewed: 05/29/2016 °Elsevier Interactive Patient Education © 2017 [employer] °Moderate Conscious Sedation, Adult, Care After °These instructions provide you with information about caring for yourself after your procedure. Your health care provider may also give you more specific instructions. Your treatment has been planned according to current medical practices, but problems sometimes occur. Call your health care provider if you have any problems or questions after your procedure. °What can I expect after the   procedure? °After your procedure, it is common: °· To feel sleepy for several hours. °· To feel clumsy and have poor balance for several hours. °· To have poor judgment for several hours. °· To vomit if you eat too soon. °Follow these instructions at home: °For at least 24 hours after the procedure:  ° °· Do not: °¨ Participate in activities where you could fall or become injured. °¨ Drive. °¨ Use heavy machinery. °¨ Drink alcohol. °¨ Take sleeping  pills or medicines that cause drowsiness. °¨ Make important decisions or sign legal documents. °¨ Take care of children on your own. °· Rest. °Eating and drinking  °· Follow the diet recommended by your health care provider. °· If you vomit: °¨ Drink water, juice, or soup when you can drink without vomiting. °¨ Make sure you have little or no nausea before eating solid foods. °General instructions  °· Have a responsible adult stay with you until you are awake and alert. °· Take over-the-counter and prescription medicines only as told by your health care provider. °· If you smoke, do not smoke without supervision. °· Keep all follow-up visits as told by your health care provider. This is important. °Contact a health care provider if: °· You keep feeling nauseous or you keep vomiting. °· You feel light-headed. °· You develop a rash. °· You have a fever. °Get help right away if: °· You have trouble breathing. °This information is not intended to replace advice given to you by your health care provider. Make sure you discuss any questions you have with your health care provider. °Document Released: 04/28/2013 Document Revised: 12/11/2015 Document Reviewed: 10/28/2015 °Elsevier Interactive Patient Education © 2017 [employer] ° °

## 2016-09-24 ENCOUNTER — Telehealth: Payer: Self-pay | Admitting: Cardiovascular Disease

## 2016-09-24 ENCOUNTER — Encounter: Payer: Self-pay | Admitting: Hematology and Oncology

## 2016-09-24 ENCOUNTER — Ambulatory Visit (HOSPITAL_BASED_OUTPATIENT_CLINIC_OR_DEPARTMENT_OTHER): Payer: Medicare Other | Admitting: Hematology and Oncology

## 2016-09-24 DIAGNOSIS — C50911 Malignant neoplasm of unspecified site of right female breast: Secondary | ICD-10-CM | POA: Diagnosis not present

## 2016-09-24 DIAGNOSIS — C782 Secondary malignant neoplasm of pleura: Secondary | ICD-10-CM

## 2016-09-24 MED ORDER — PALBOCICLIB 125 MG PO CAPS: 125.0000 mg | ORAL_CAPSULE | Freq: Every day | ORAL | 3 refills | Status: DC

## 2016-09-24 MED ORDER — VALACYCLOVIR HCL 1 G PO TABS: 1000.0000 mg | ORAL_TABLET | Freq: Two times a day (BID) | ORAL | 0 refills | Status: DC

## 2016-09-24 MED ORDER — LETROZOLE 2.5 MG PO TABS: 2.5000 mg | ORAL_TABLET | Freq: Every day | ORAL | 3 refills | Status: DC

## 2016-09-24 MED FILL — LETROZOLE 2.5 MG TABLET: 2.5 | 90 days supply | Qty: 90 | Fill #0

## 2016-09-24 MED FILL — IBRANCE 125 MG CAPSULE: 125 | 21 days supply | Qty: 21 | Fill #0

## 2016-09-24 MED FILL — valACYclovir HCL 1 GM TABS: 1 | 7 days supply | Qty: 14 | Fill #0

## 2016-09-24 MED FILL — VERAPAMIL ER PM 100 MG CAP: 100 | 30 days supply | Qty: 30 | Fill #0

## 2016-09-24 NOTE — Telephone Encounter (Signed)
New Message   Pt c/o BP issue: STAT if pt c/o blurred vision, one-sided weakness or slurred speech  1. What are your last 5 BP readings? 7:06am 177/111 p 90, 7:15 172/108 p 83, 8:54am 164/93 p 86  2. Are you having any other symptoms (ex. Dizziness, headache, blurred vision, passed out)? Developed rash on neck yesterday morning which has spread  3. What is your BP issue? Per pt BP has been rising... Requesting call back

## 2016-09-24 NOTE — Progress Notes (Signed)
Patient Care Team: [name], MD as PCP - General (Internal Medicine)  DIAGNOSIS:  Encounter Diagnosis  Name Primary?  . 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 ([name])     SUMMARY OF ONCOLOGIC HISTORY:   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 [name])   1998 Initial Diagnosis    Right breast carcinoma T2N1 ER/PR positive at right mastectomy with node evaluation Dec 1998, treated with adjuvant adriamycin cytoxan followed by 5 years of tamoxifen      2010 Relapse/Recurrence    Metastatic breast cancer to the pleura      08/29/2008 - 08/30/2015 Anti-estrogen oral therapy    Aromasin followed by letrozole      07/2015 Relapse/Recurrence    Progression to bone required aggressive laminectomy February 2017      08/30/2015 - 09/13/2015 Radiation Therapy    Palliative radiation to the spine,  Right ilium ad right hip      01/05/2016 - 04/29/2016 Chemotherapy    Xeloda stopped when she had progression of dsease       05/02/2016 PET scan     innumerable hypermetabolic lesions throughout the axial and appendicular skeleton, increase in size and number of new lesions in skull, right scapular glenoid, throughout sacrum. Findings concerning for liver metastases, numerous pulmonary nodules bilaterally      05/09/2016 -  Chemotherapy    Taxol weekly initially and later changed her day 1 day 8 every 3 weeks (complicated by A. fib with RVR and prolonged respiratory infection) along with Xgeva       09/19/2016 Procedure    Biopsy of lung mass: Metastatic carcinoma consistent with breast origin, HER-2 negative ratio 1.37       CHIEF COMPLIANT: Complaining of skin rash on the neck  INTERVAL HISTORY: Lisa [name] is a 76 year old with above-mentioned history metastatic breast cancer who received Taxol chemotherapy and recently underwent CT scans which showed stable disease in the lungs. She underwent a CT-guided biopsy and is here today to discuss the results. Biopsy revealed that  this was the same breast cancer she had before. It was ER/PR positive HER-2 negative. She is accompanied by her family to discuss a treatment plan. She has noticed a rash on the neck which appears to be extending up to the nape of the neck. It is accompanied by itching.  REVIEW OF SYSTEMS:   Constitutional: Denies fevers, chills or abnormal weight loss Eyes: Denies blurriness of vision Ears, nose, mouth, throat, and face: Denies mucositis or sore throat Respiratory: Denies cough, dyspnea or wheezes Cardiovascular: Denies palpitation, chest discomfort Gastrointestinal:  Denies nausea, heartburn or change in bowel habits Skin: Denies abnormal skin rashes Lymphatics: Denies new lymphadenopathy or easy bruising Neurological:Denies numbness, tingling or new weaknesses Behavioral/Psych: Mood is stable, no new changes  Extremities: No lower extremity edema  All other systems were reviewed with the patient and are negative.  I have reviewed the past medical history, past surgical history, social history and family history with the patient and they are unchanged from previous note.  ALLERGIES:  is allergic to diphenhydramine hcl; codeine sulfate; oseltamivir phosphate; oxycodone-acetaminophen; prednisone; and sudafed [pseudoephedrine hcl].  MEDICATIONS:  Current Outpatient Prescriptions  Medication Sig Dispense Refill  . acetaminophen (TYLENOL) 500 MG tablet Take 500 mg by mouth every 6 (six) hours as needed for moderate pain.    [name] amiodarone (PACERONE) 200 MG tablet Take 1 tablet (200 mg total) by mouth daily. 90 tablet 3  . apixaban (ELIQUIS) 5 MG TABS tablet Take  1 tablet (5 mg total) by mouth 2 (two) times daily. 180 tablet 3  . dexamethasone (DECADRON) 4 MG tablet Take 4 tabs (16 mg) 12 hours before taxol with food 12 tablet 0  . fluticasone (FLONASE) 50 MCG/ACT nasal spray Place 1 spray into both nostrils 2 (two) times daily. (Patient taking differently: Place 1 spray into both nostrils 2  (two) times daily as needed for allergies. ) 16 g 2  . furosemide (LASIX) 40 MG tablet Take 1 tablet (40 mg total) by mouth daily. 90 tablet 3  . letrozole (FEMARA) 2.5 MG tablet Take 1 tablet (2.5 mg total) by mouth daily. 90 tablet 3  . loratadine (CLARITIN) 10 MG tablet Take 10 mg by mouth daily.     . ondansetron (ZOFRAN) 8 MG tablet Take 1 tablet (8 mg total) by mouth every 8 (eight) hours as needed for nausea or vomiting. 20 tablet 2  . palbociclib (IBRANCE) 125 MG capsule Take 1 capsule (125 mg total) by mouth daily with breakfast. Take whole with food. 21 capsule 3  . potassium chloride (K-DUR) 10 MEQ tablet Take 1 tablet (10 mEq total) by mouth as directed. 90 tablet 3  . pravastatin (PRAVACHOL) 40 MG tablet Take 1 tablet (40 mg total) by mouth every evening. (Patient taking differently: Take 40 mg by mouth every evening. Pt finishing her bottle of simvastatin before switching to pravastatin) 90 tablet 3  . traMADol (ULTRAM) 50 MG tablet Take 50-100 mg by mouth every 8 (eight) hours as needed for moderate pain.    . valACYclovir (VALTREX) 1000 MG tablet Take 1 tablet (1,000 mg total) by mouth 2 (two) times daily. 14 tablet 0  . vitamin B-12 (CYANOCOBALAMIN) 1000 MCG tablet Take 1,000 mcg by mouth 2 (two) times daily.    . vitamin C (ASCORBIC ACID) 500 MG tablet Take 500 mg by mouth daily.      . vitamin E 200 UNIT capsule Take 200 Units by mouth daily.       No current facility-administered medications for this visit.     PHYSICAL EXAMINATION: ECOG PERFORMANCE STATUS: 1 - Symptomatic but completely ambulatory  Vitals:   09/24/16 1436 09/24/16 1438  BP: (!) 171/79 (!) 146/78  Pulse: 86   Resp: 18   Temp: 97.9 F (36.6 C)    Filed Weights   09/24/16 1436  Weight: 163 lb 3.2 oz (74 kg)    GENERAL:alert, no distress and comfortable SKIN: skin color, texture, turgor are normal, no rashes or significant lesions EYES: normal, Conjunctiva are pink and non-injected, sclera  clear OROPHARYNX:no exudate, no erythema and lips, buccal mucosa, and tongue normal  NECK: supple, thyroid normal size, non-tender, without nodularity LYMPH:  no palpable lymphadenopathy in the cervical, axillary or inguinal LUNGS: clear to auscultation and percussion with normal breathing effort HEART: regular rate & rhythm and no murmurs and no lower extremity edema ABDOMEN:abdomen soft, non-tender and normal bowel sounds MUSCULOSKELETAL:no cyanosis of digits and no clubbing  NEURO: alert & oriented x 3 with fluent speech, no focal motor/sensory deficits EXTREMITIES: No lower extremity edema  LABORATORY DATA:  I have reviewed the data as listed   Chemistry      Component Value Date/Time   NA 141 09/12/2016 1418   K 3.6 09/12/2016 1418   CL 106 07/25/2016 1457   CL 108 (H) 01/13/2013 0806   CO2 25 09/12/2016 1418   BUN 12.6 09/12/2016 1418   CREATININE 0.9 09/12/2016 1418   GLU 114 09/12/2015  Component Value Date/Time   CALCIUM 9.2 09/12/2016 1418   ALKPHOS 84 09/12/2016 1418   AST 26 09/12/2016 1418   ALT 20 09/12/2016 1418   BILITOT 0.53 09/12/2016 1418       Lab Results  Component Value Date   WBC 4.6 09/19/2016   HGB 12.1 09/19/2016   HCT 35.6 (L) 09/19/2016   MCV 84.4 09/19/2016   PLT 227 09/19/2016   NEUTROABS 2.9 09/19/2016    ASSESSMENT & PLAN:  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 [facility]) Metastatic breast cancer tolungs and bone: Clinically improving onweekly taxol beginning 05-09-16, several delays including for counts, rapid A fib, respiratory infection etc  Treatment summary: Taxol days 1 and 8 every 3 weeks with Neupogen along with Xgeva; completed 4 cycles from 05/09/2016- 08/29/2016 Taxol toxicities: Neutropenia: Requiring Neupogen with each treatment  Atrial fibrillation: Recurrent with RVR, cardioverted 07/25/2016, on an liquids, amiodarone, metoprolol and Lasix for CHF (Dr. Sallyanne Kuster) CKD stage III Pathologic compression fracture T10  status post radiation and laminectomy T6-L1 09/01/2015, radiation to right iliac area 05/2016  Rebiopsy of the lung nodule 09/19/2016: Metastatic breast cancer that is HER-2 negative, ER/PR pending  Recommendation: 1. Stop chemotherapy 2. Initiate letrozole with Palbociclib (she will start on 10/01/2016)  Counseling: I discussed at length the risks and benefits of Palbociclib in combination with letrozole. Adverse effects of Palbociclib include decreasing neutrophil count, pneumonia, blood clots in lungs as well as nausea and GI symptoms. Side effects of letrozole include hot flashes, muscle aches and pains, uterine bleeding/spotting/cancer, osteoporosis, risk of blood clots.  Shingles on the neck: I sent a prescription for Valtrex 1000 mg by mouth twice a day for 1 week. I instructed her to start Palbociclib after the shingles got better.  Return to clinic in 3 weeks for toxicity check and follow-up  I spent 25 minutes talking to the patient of which more than half was spent in counseling and coordination of care.  No orders of the defined types were placed in this encounter.  The patient has a good understanding of the overall plan. she agrees with it. she will call with any problems that may develop before the next visit here.   Rulon Eisenmenger, MD 09/24/16

## 2016-09-24 NOTE — Telephone Encounter (Signed)
Pt notified appt scheduled for next week(ok per Sharolyn Douglas) and she will call PCP for direction for her rash. She has appt with oncology today she will show them also.

## 2016-09-24 NOTE — Telephone Encounter (Signed)
Spoke with pt states that she has a rash that starts behind her ears and goes down her neck she states that her BP today is 7:06am 177/111 p 90, 7:15 172/108 p 83, 8:54am 164/93 p 86. She states that she allergic to benadryl. Her BP 177/111 HR 90 her BP yesterday 134/94 HR 86. Pt states that she takes her BP frequently and in the last month it has only been in the 160's a few times and she took a old rx of her Losartan and this did not seem to help. Pt states that she is taking all her medication as ordered. Pt denies any other symptoms, nausea, vomiting, chest pain or pressure, shortness of breath,  no lightheaded or dizziness. Pt states that she has oncology appt today at 215pm.

## 2016-09-24 NOTE — Assessment & Plan Note (Signed)
Metastatic breast cancer tolungs and bone: Clinically improving onweekly taxol beginning 05-09-16, several delays including for counts, rapid A fib, respiratory infection etc  Treatment summary: Taxol days 1 and 8 every 3 weeks with Neupogen along with Xgeva; completed 4 cycles from 05/09/2016- 08/29/2016 Taxol toxicities: Neutropenia: Requiring Neupogen with each treatment  Atrial fibrillation: Recurrent with RVR, cardioverted 07/25/2016, on an liquids, amiodarone, metoprolol and Lasix for CHF (Dr. Sallyanne Kuster) CKD stage III Pathologic compression fracture T10 status post radiation and laminectomy T6-L1 09/01/2015, radiation to right iliac area 05/2016  Rebiopsy of the lung nodule 09/19/2016: Metastatic breast cancer that is HER-2 negative, ER/PR pending  Recommendation: 1. Stop chemotherapy 2. Initiate letrozole with Palbociclib  Counseling: I discussed at length the risks and benefits of Palbociclib in combination with letrozole. Adverse effects of Palbociclib include decreasing neutrophil count, pneumonia, blood clots in lungs as well as nausea and GI symptoms. Side effects of letrozole include hot flashes, muscle aches and pains, uterine bleeding/spotting/cancer, osteoporosis, risk of blood clots.  Return to clinic in 2 weeks for toxicity check and follow-up

## 2016-09-24 NOTE — Telephone Encounter (Signed)
Spoke with Sharolyn Douglas, NP he states that pt should be seen for BP soon but needs to call PCP for the rash.

## 2016-09-25 ENCOUNTER — Telehealth: Payer: Self-pay | Admitting: Pharmacist

## 2016-09-25 NOTE — Telephone Encounter (Signed)
Oral Chemotherapy Pharmacist Encounter   I spoke with patient for overview of new oral chemotherapy medication: Ibrance. Pt is doing well. The prescriptions have been sent to the [facility] for benefit analysis and approval, they will use a voucher for first fill and call patient when ready for pick-up.   Counseled patient on administration, dosing, side effects, safe handling, and monitoring. Patient will take 1 capsule (125mg  total) by mouth daily with breakfast for 3 weeks on and 1 week off. Patient knows to avoid grapefruit and grapefruit juice. Patient is to start 1 week of Valtrex for presumed shingles outbreak on 09/25/16 so she will wait until completion of that therapy prior to starting on Ibrance.  Side effects include but not limited to: fatigue, decreased blood counts, increased risk of upper respiratory infection, rash, and N/V.  Lisa [name] voiced understanding and appreciation.   Drug interaction between amiodarone and Ibrance discussed with patient. Category C interaction, monitor for adverse events, Ibrance, an inhibitor of CYP3A4, may decrease metabolism of amiodarone leading to increased exposure. Patient takes HR at home daily and will continue this process. She will reach out tot he office if HR values start to trend down, normal range for patient 75-85 bpm.  All questions answered.  Will follow up with patient regarding insurance and pharmacy. Will follow up in 1-2 weeks for adherence and toxicity management.   Thank you,  Johny Drilling, PharmD, BCPS, BCOP 09/25/2016  8:24 AM [facility] [phone]

## 2016-09-30 ENCOUNTER — Other Ambulatory Visit: Payer: Self-pay | Admitting: Hematology and Oncology

## 2016-09-30 ENCOUNTER — Telehealth: Payer: Self-pay

## 2016-09-30 MED FILL — valACYclovir HCL 1 GM TABS: 1 | 7 days supply | Qty: 14 | Fill #0

## 2016-09-30 NOTE — Telephone Encounter (Signed)
Pt wanted to know if she can take valtrex for a couple more days since she still has some blisters from shingles. Pt also complains of itching. Told pt that she is able to use calamine lotion or domeboro to help dry up her blisters and prevent itching. Spoke with Dr.Gudena and okay to continue for another 7days with Valtrex. Pt wanted to know if she should start letrozole and when she can start ibrance. Told pt that she should be able to start letrozole tonight and to wait until she is better with her shingles. Pt verbalized understanding and will call for any more questions or concerns.

## 2016-09-30 NOTE — Telephone Encounter (Signed)
error 

## 2016-10-02 ENCOUNTER — Ambulatory Visit: Payer: Medicare Other | Admitting: Physician Assistant

## 2016-10-04 ENCOUNTER — Telehealth: Payer: Self-pay | Admitting: Emergency Medicine

## 2016-10-04 NOTE — Telephone Encounter (Signed)
Patient called to give this office a follow up on how she is feeling. Patient states that the 2nd prescription "really, really helped". Patient states that she noticed some breakouts but those seem to be healing now.

## 2016-10-11 ENCOUNTER — Other Ambulatory Visit: Payer: Self-pay | Admitting: Hematology and Oncology

## 2016-10-11 ENCOUNTER — Ambulatory Visit
Admission: RE | Admit: 2016-10-11 | Discharge: 2016-10-11 | Disposition: A | Discharge status: Home / Self Care | Payer: Medicare Other | Source: Ambulatory Visit | Attending: Hematology and Oncology | Admitting: Hematology and Oncology

## 2016-10-11 DIAGNOSIS — Z853 Personal history of malignant neoplasm of breast: Secondary | ICD-10-CM

## 2016-10-11 DIAGNOSIS — Z1231 Encounter for screening mammogram for malignant neoplasm of breast: Secondary | ICD-10-CM

## 2016-10-14 ENCOUNTER — Other Ambulatory Visit: Payer: Self-pay | Admitting: Emergency Medicine

## 2016-10-14 DIAGNOSIS — C782 Secondary malignant neoplasm of pleura: Principal | ICD-10-CM

## 2016-10-14 DIAGNOSIS — C50911 Malignant neoplasm of unspecified site of right female breast: Secondary | ICD-10-CM

## 2016-10-15 ENCOUNTER — Encounter: Payer: Self-pay | Admitting: Hematology and Oncology

## 2016-10-15 ENCOUNTER — Other Ambulatory Visit (HOSPITAL_BASED_OUTPATIENT_CLINIC_OR_DEPARTMENT_OTHER): Payer: Medicare Other

## 2016-10-15 ENCOUNTER — Ambulatory Visit (HOSPITAL_BASED_OUTPATIENT_CLINIC_OR_DEPARTMENT_OTHER): Payer: Medicare Other | Admitting: Hematology and Oncology

## 2016-10-15 DIAGNOSIS — C50911 Malignant neoplasm of unspecified site of right female breast: Secondary | ICD-10-CM

## 2016-10-15 DIAGNOSIS — I4891 Unspecified atrial fibrillation: Secondary | ICD-10-CM

## 2016-10-15 DIAGNOSIS — C782 Secondary malignant neoplasm of pleura: Secondary | ICD-10-CM | POA: Diagnosis not present

## 2016-10-15 LAB — COMPREHENSIVE METABOLIC PANEL
ALK PHOS: 95 U/L (ref 40–150)
ALT: 26 U/L (ref 0–55)
ANION GAP: 11 meq/L (ref 3–11)
AST: 32 U/L (ref 5–34)
Albumin: 3.9 g/dL (ref 3.5–5.0)
BILIRUBIN TOTAL: 0.93 mg/dL (ref 0.20–1.20)
BUN: 19.6 mg/dL (ref 7.0–26.0)
CALCIUM: 10.1 mg/dL (ref 8.4–10.4)
CHLORIDE: 107 meq/L (ref 98–109)
CO2: 21 mEq/L — ABNORMAL LOW (ref 22–29)
CREATININE: 1.1 mg/dL (ref 0.6–1.1)
EGFR: 53 mL/min/{1.73_m2} — ABNORMAL LOW (ref >90)
Glucose: 96 mg/dl (ref 70–140)
Potassium: 3.8 mEq/L (ref 3.5–5.1)
SODIUM: 140 meq/L (ref 136–145)
Total Protein: 7.3 g/dL (ref 6.4–8.3)

## 2016-10-15 LAB — CBC WITH DIFFERENTIAL/PLATELET
BASO%: 0.7 % (ref 0.0–2.0)
Basophils Absolute: 0 10*3/uL (ref 0.0–0.1)
EOS%: 3.3 % (ref 0.0–7.0)
Eosinophils Absolute: 0.2 10*3/uL (ref 0.0–0.5)
HCT: 41.7 % (ref 34.8–46.6)
HGB: 13.9 g/dL (ref 11.6–15.9)
LYMPH%: 20.4 % (ref 14.0–49.7)
MCH: 29.8 pg (ref 25.1–34.0)
MCHC: 33.3 g/dL (ref 31.5–36.0)
MCV: 89.5 fL (ref 79.5–101.0)
MONO#: 0.5 10*3/uL (ref 0.1–0.9)
MONO%: 10.9 % (ref 0.0–14.0)
NEUT#: 2.9 10*3/uL (ref 1.5–6.5)
NEUT%: 64.7 % (ref 38.4–76.8)
PLATELETS: 174 10*3/uL (ref 145–400)
RBC: 4.66 10*6/uL (ref 3.70–5.45)
RDW: 17.1 % — ABNORMAL HIGH (ref 11.2–14.5)
WBC: 4.5 10*3/uL (ref 3.9–10.3)
lymph#: 0.9 10*3/uL (ref 0.9–3.3)

## 2016-10-15 MED ORDER — GABAPENTIN 300 MG PO CAPS: 300.0000 mg | ORAL_CAPSULE | Freq: Every day | ORAL | 1 refills | Status: DC

## 2016-10-15 MED FILL — GABAPENTIN 300 MG CAPSULE: 300 | 30 days supply | Qty: 30 | Fill #0

## 2016-10-15 NOTE — Assessment & Plan Note (Signed)
Metastatic breast cancer tolungs and bone: Clinically improving onweekly taxol beginning 05-09-16, several delays including for counts, rapid A fib, respiratory infection etc  Treatment summary: Taxol days 1 and 8 every 3 weeks with Neupogen along with Xgeva; completed 4 cycles from 05/09/2016- 08/29/2016 Taxol toxicities: Neutropenia: Requiring Neupogen with each treatment  Atrial fibrillation: Recurrent with RVR, cardioverted 07/25/2016, on an liquids, amiodarone, metoprolol and Lasix for CHF (Dr. Sallyanne Kuster) CKD stage III Pathologic compression fracture T10 status post radiation and laminectomy T6-L1 09/01/2015, radiation to right iliac area 05/2016  Rebiopsy of the lung nodule 09/19/2016: Metastatic breast cancer that is HER-2 negative, ER/PR pending  Current treatment: Letrozole with Palbociclib  started 10/01/2016  Blood work review:   Return to clinic in 2 weeks for follow-up with labs

## 2016-10-15 NOTE — Progress Notes (Signed)
Patient Care Team: [name], MD as PCP - General (Internal Medicine)  DIAGNOSIS:  Encounter Diagnosis  Name Primary?  . 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 ([place])     SUMMARY OF ONCOLOGIC HISTORY:   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 [facility])   1998 Initial Diagnosis    Right breast carcinoma T2N1 ER/PR positive at right mastectomy with node evaluation Dec 1998, treated with adjuvant adriamycin cytoxan followed by 5 years of tamoxifen      2010 Relapse/Recurrence    Metastatic breast cancer to the pleura      08/29/2008 - 08/30/2015 Anti-estrogen oral therapy    Aromasin followed by letrozole      07/2015 Relapse/Recurrence    Progression to bone required aggressive laminectomy February 2017      08/30/2015 - 09/13/2015 Radiation Therapy    Palliative radiation to the spine,  Right ilium ad right hip      01/05/2016 - 04/29/2016 Chemotherapy    Xeloda stopped when she had progression of dsease       05/02/2016 PET scan     innumerable hypermetabolic lesions throughout the axial and appendicular skeleton, increase in size and number of new lesions in skull, right scapular glenoid, throughout sacrum. Findings concerning for liver metastases, numerous pulmonary nodules bilaterally      05/09/2016 -  Chemotherapy    Taxol weekly initially and later changed her day 1 day 8 every 3 weeks (complicated by A. fib with RVR and prolonged respiratory infection) along with Xgeva       09/19/2016 Procedure    Biopsy of lung mass: Metastatic carcinoma consistent with breast origin, HER-2 negative ratio 1.37       CHIEF COMPLIANT: Improvement in shingles, patient is going to start Palbociclib  INTERVAL HISTORY: Lisa [name] is a 76 year old with above-mentioned history of metastatic breast cancer who had shingles and was treated with 2 courses of Valtrex. Shingles appears to be improving. But she is having neuropathic pain and discomfort that shoots down her neck  into the face as well. But the rash itself has improved significantly. She is tolerating letrozole fairly well. She does complain of hot flashes.  REVIEW OF SYSTEMS:   Constitutional: Denies fevers, chills or abnormal weight loss Eyes: Denies blurriness of vision Ears, nose, mouth, throat, and face: Denies mucositis or sore throat Respiratory: Denies cough, dyspnea or wheezes Cardiovascular: Denies palpitation, chest discomfort Gastrointestinal:  Denies nausea, heartburn or change in bowel habits Skin: Shingles rash on the face and neck Lymphatics: Denies new lymphadenopathy or easy bruising Neurological:Denies numbness, tingling or new weaknesses Behavioral/Psych: Mood is stable, no new changes  Extremities: No lower extremity edema All other systems were reviewed with the patient and are negative.  I have reviewed the past medical history, past surgical history, social history and family history with the patient and they are unchanged from previous note.  ALLERGIES:  is allergic to diphenhydramine hcl; codeine sulfate; oseltamivir phosphate; oxycodone-acetaminophen; prednisone; and sudafed [pseudoephedrine hcl].  MEDICATIONS:  Current Outpatient Prescriptions  Medication Sig Dispense Refill  . acetaminophen (TYLENOL) 500 MG tablet Take 500 mg by mouth every 6 (six) hours as needed for moderate pain.    [name] amiodarone (PACERONE) 200 MG tablet Take 1 tablet (200 mg total) by mouth daily. 90 tablet 3  . apixaban (ELIQUIS) 5 MG TABS tablet Take 1 tablet (5 mg total) by mouth 2 (two) times daily. 180 tablet 3  . dexamethasone (DECADRON) 4 MG tablet Take 4 tabs (16  mg) 12 hours before taxol with food 12 tablet 0  . fluticasone (FLONASE) 50 MCG/ACT nasal spray Place 1 spray into both nostrils 2 (two) times daily. (Patient taking differently: Place 1 spray into both nostrils 2 (two) times daily as needed for allergies. ) 16 g 2  . furosemide (LASIX) 40 MG tablet Take 1 tablet (40 mg total) by  mouth daily. 90 tablet 3  . letrozole (FEMARA) 2.5 MG tablet Take 1 tablet (2.5 mg total) by mouth daily. 90 tablet 3  . loratadine (CLARITIN) 10 MG tablet Take 10 mg by mouth daily.     . ondansetron (ZOFRAN) 8 MG tablet Take 1 tablet (8 mg total) by mouth every 8 (eight) hours as needed for nausea or vomiting. 20 tablet 2  . palbociclib (IBRANCE) 125 MG capsule Take 1 capsule (125 mg total) by mouth daily with breakfast. Take whole with food. 21 capsule 3  . potassium chloride (K-DUR) 10 MEQ tablet Take 1 tablet (10 mEq total) by mouth as directed. 90 tablet 3  . pravastatin (PRAVACHOL) 40 MG tablet Take 1 tablet (40 mg total) by mouth every evening. (Patient taking differently: Take 40 mg by mouth every evening. Pt finishing her bottle of simvastatin before switching to pravastatin) 90 tablet 3  . traMADol (ULTRAM) 50 MG tablet Take 50-100 mg by mouth every 8 (eight) hours as needed for moderate pain.    . valACYclovir (VALTREX) 1000 MG tablet TAKE 1 TABLET (1,000 MG TOTAL) BY MOUTH 2 (TWO) TIMES DAILY. 14 tablet 0  . vitamin B-12 (CYANOCOBALAMIN) 1000 MCG tablet Take 1,000 mcg by mouth 2 (two) times daily.    . vitamin C (ASCORBIC ACID) 500 MG tablet Take 500 mg by mouth daily.      . vitamin E 200 UNIT capsule Take 200 Units by mouth daily.       No current facility-administered medications for this visit.     PHYSICAL EXAMINATION: ECOG PERFORMANCE STATUS: 1 - Symptomatic but completely ambulatory  Vitals:   10/15/16 1436  BP: 138/84  Pulse: 79  Resp: 18  Temp: 97.9 F (36.6 C)   Filed Weights   10/15/16 1436  Weight: 161 lb 14.4 oz (73.4 kg)    GENERAL:alert, no distress and comfortable SKIN: Rash related to sit shingles has been fading. EYES: normal, Conjunctiva are pink and non-injected, sclera clear OROPHARYNX:no exudate, no erythema and lips, buccal mucosa, and tongue normal  NECK: supple, thyroid normal size, non-tender, without nodularity LYMPH:  no palpable  lymphadenopathy in the cervical, axillary or inguinal LUNGS: clear to auscultation and percussion with normal breathing effort HEART: regular rate & rhythm and no murmurs and no lower extremity edema ABDOMEN:abdomen soft, non-tender and normal bowel sounds MUSCULOSKELETAL:no cyanosis of digits and no clubbing  NEURO: alert & oriented x 3 with fluent speech, no focal motor/sensory deficits EXTREMITIES: No lower extremity edema  LABORATORY DATA:  I have reviewed the data as listed   Chemistry      Component Value Date/Time   NA 141 09/12/2016 1418   K 3.6 09/12/2016 1418   CL 106 07/25/2016 1457   CL 108 (H) 01/13/2013 0806   CO2 25 09/12/2016 1418   BUN 12.6 09/12/2016 1418   CREATININE 0.9 09/12/2016 1418   GLU 114 09/12/2015      Component Value Date/Time   CALCIUM 9.2 09/12/2016 1418   ALKPHOS 84 09/12/2016 1418   AST 26 09/12/2016 1418   ALT 20 09/12/2016 1418   BILITOT 0.53 09/12/2016  1418       Lab Results  Component Value Date   WBC 4.5 10/15/2016   HGB 13.9 10/15/2016   HCT 41.7 10/15/2016   MCV 89.5 10/15/2016   PLT 174 10/15/2016   NEUTROABS 2.9 10/15/2016    ASSESSMENT & PLAN:  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 [facility]) Metastatic breast cancer tolungs and bone: Clinically improving onweekly taxol beginning 05-09-16, several delays including for counts, rapid A fib, respiratory infection etc  Treatment summary: Taxol days 1 and 8 every 3 weeks with Neupogen along with Xgeva; completed 4 cycles from 05/09/2016- 08/29/2016 Taxol toxicities: Neutropenia: Requiring Neupogen with each treatment  Atrial fibrillation: Recurrent with RVR, cardioverted 07/25/2016, on an liquids, amiodarone, metoprolol and Lasix for CHF (Dr. Sallyanne Kuster) CKD stage III Pathologic compression fracture T10 status post radiation and laminectomy T6-L1 09/01/2015, radiation to right iliac area 05/2016  Rebiopsy of the lung nodule 09/19/2016: Metastatic breast cancer that is HER-2  negative, ER/PR pending  Current treatment: Letrozole Started 10/01/2016 with Palbociclib to be started 10/15/2016   Return to clinic in 2 weeks for follow-up with labs. I discussed with the patient that we would need the ANC to be greater than 1.0 to continue with this treatment. I will see the patient in 4 weeks.  I spent 25 minutes talking to the patient of which more than half was spent in counseling and coordination of care.  No orders of the defined types were placed in this encounter.  The patient has a good understanding of the overall plan. she agrees with it. she will call with any problems that may develop before the next visit here.   Rulon Eisenmenger, MD 10/15/16

## 2016-10-18 ENCOUNTER — Telehealth: Payer: Self-pay | Admitting: *Deleted

## 2016-10-18 MED ORDER — GABAPENTIN 100 MG PO CAPS: ORAL_CAPSULE | ORAL | 0 refills | Status: DC

## 2016-10-18 MED FILL — GABAPENTIN 100 MG CAPSULE: 100 | 20 days supply | Qty: 60 | Fill #0

## 2016-10-18 NOTE — Telephone Encounter (Signed)
This RN spoke with pt per her call stating vertigo with use of gabapentin.  Per discussion - Lisa [name] states she started the 300 mg the night Dr Lindi Adie prescribed - the next morning she felt very dizzy upon getting out of bed. She took an Bovine for vertigo " but then the rest of the day I just feel off and when I turned my head it felt like my brain was wooshing "  She held it the next night and then attempted again on the 28th - and symptoms slightly lessened recurred.  Per discussion - prescription for gabapentin 100 mg nightly to titrate up to 300 mg.  Pt appreciated above and understands plan.  New prescription called to verified pharmacy,

## 2016-10-22 ENCOUNTER — Encounter (HOSPITAL_COMMUNITY): Payer: Self-pay

## 2016-10-25 ENCOUNTER — Ambulatory Visit (INDEPENDENT_AMBULATORY_CARE_PROVIDER_SITE_OTHER): Payer: Medicare Other | Admitting: Cardiovascular Disease

## 2016-10-25 ENCOUNTER — Encounter: Payer: Self-pay | Admitting: Cardiovascular Disease

## 2016-10-25 VITALS — BP 112/68 | HR 75 | Ht 62.0 in | Wt 162.0 lb

## 2016-10-25 DIAGNOSIS — E785 Hyperlipidemia, unspecified: Secondary | ICD-10-CM

## 2016-10-25 DIAGNOSIS — I313 Pericardial effusion (noninflammatory): Secondary | ICD-10-CM

## 2016-10-25 DIAGNOSIS — I10 Essential (primary) hypertension: Secondary | ICD-10-CM | POA: Diagnosis not present

## 2016-10-25 DIAGNOSIS — I480 Paroxysmal atrial fibrillation: Secondary | ICD-10-CM | POA: Diagnosis not present

## 2016-10-25 DIAGNOSIS — I5032 Chronic diastolic (congestive) heart failure: Secondary | ICD-10-CM

## 2016-10-25 DIAGNOSIS — Z79899 Other long term (current) drug therapy: Secondary | ICD-10-CM | POA: Diagnosis not present

## 2016-10-25 DIAGNOSIS — I3139 Other pericardial effusion (noninflammatory): Secondary | ICD-10-CM

## 2016-10-25 DIAGNOSIS — Z7901 Long term (current) use of anticoagulants: Secondary | ICD-10-CM

## 2016-10-25 NOTE — Patient Instructions (Signed)
Medication Instructions: Dr Sallyanne Kuster recommends that you continue on your current medications as directed. Please refer to the Current Medication list given to you today.  Labwork: Your physician recommends that you return for lab work in 6 months just before you come back to see Dr C.  Testing/Procedures: 1. Limited Echocardiogram on 12/11/16 - Your physician has requested that you have an echocardiogram. Echocardiography is a painless test that uses sound waves to create images of your heart. It provides your doctor with information about the size and shape of your heart and how well your heart's chambers and valves are working. This procedure takes approximately one hour. There are no restrictions for this procedure.  Follow-up: Dr Crotioru recommends that you schedule a follow-up appointment in 6 months. You will receive a reminder letter in the mail two months in advance. If you don't receive a letter, please call our office to schedule the follow-up appointment.  If you need a refill on your cardiac medications before your next appointment, please call your pharmacy.

## 2016-10-25 NOTE — Progress Notes (Signed)
Dictation #1 [id number]  [id number]    Cardiology Office Note    Date:  10/25/2016   ID:  Lisa [name], DOB [birth year], MRN [id number]  PCP:  [name], MD  Cardiologist:   [name], MD   Chief Complaint  Patient presents with  . Follow-up    History of Present Illness:  Lisa [name] is a 76 y.o. female with fairly recent onset paroxysmal atrial fibrillation that led to decompensated heart failure in late 2017, cardioversion 05/27/2016, with recurrence and a successful cardioversion in ED 07/25/2016. She has widely metastatic breast cancer, hypertension, hyperlipidemia, stage II chronic kidney disease, remote history of smoking.  She has not had exertional angina or dyspnea. Her weight has been extremely steady at 160 pounds on her home scale (roughly 2 pounds more in the office). She has not had leg edema, palpitations, dizziness or syncope. She had a few problems with epistaxis during the dry winter months, now resolved. She has not had any serious bleeding problems. On March 6 she developed shingles in a wide distribution in the left cervical and upper thoracic distribution including the occipital area. The vesicular lesions have healed but she has residual stinging neuralgia. She has not tolerated more than 100 mg of gabapentin to 2 unsteady gait/dizziness. She did take Valtrex.  She is on combination letrozole and Ibrance for widely spread metastatic breast cancer, so far with good results.  She has normal left ventricular systolic function and mild biatrial dilation and mild to moderate pulmonary artery hypertension by echo. She has evidence of coronary artery calcification, but has not undergone angiography. She is currently on amiodarone 200 mg daily, metoprolol and has been faithfully compliant with anticoagulation. She takes pravastatin for hyperlipidemia.  Past Medical History:  Diagnosis Date  . Breast cancer ([place]) 07-13-1997   right  . Chronic  back pain   . CKD (chronic kidney disease), stage III   . Complication of anesthesia   . Coronary artery calcification of native artery    a. seen on PET scan 04/2016.  [name] Hypertension   . Pericardial effusion    a. small by TEE 05/2016.  [name] Persistent atrial fibrillation ([place])   . Pleural effusion 11-08-2008  . PONV (postoperative nausea and vomiting)   . Radiation 08/17/2015-08/29/2015   lower thoracic spine 22.5 gray  . Radiation 01/17/16-01/24/16   right femur 20Gy    Past Surgical History:  Procedure Laterality Date  . ABDOMINAL HYSTERECTOMY  02-21-1982  . BREAST BIOPSY  07-13-1997  . CARDIOVERSION N/A 05/27/2016   Procedure: CARDIOVERSION;  Surgeon: [name], MD;  Location: [facility];  Service: Cardiovascular;  Laterality: N/A;  . DILATION AND CURETTAGE, DIAGNOSTIC / THERAPEUTIC  12-13-1981  . MASTECTOMY  07-29-1997  . PELVIC LAPAROSCOPY  11-06-1981  . POSTERIOR LUMBAR FUSION 4 LEVEL N/A 09/01/2015   Procedure: Thoracic six-Lumbar one fusion with arthrodesis pedicle screw stabilization and decompression;  Surgeon: Ashok Pall, MD;  Location: [facility];  Service: Neurosurgery;  Laterality: N/A;  T6-L1 fusion with arthrodesis pedicle screw stabilization and decompression  . TEE WITHOUT CARDIOVERSION N/A 05/27/2016   Procedure: TRANSESOPHAGEAL ECHOCARDIOGRAM (TEE);  Surgeon: [name], MD;  Location: [name] ENDOSCOPY;  Service: Cardiovascular;  Laterality: N/A;  . THORACENTESIS  10-18-2008    Current Medications: Outpatient Medications Prior to Visit  Medication Sig Dispense Refill  . acetaminophen (TYLENOL) 500 MG tablet Take 500 mg by mouth every 6 (six) hours as needed for moderate pain.    [name] amiodarone (PACERONE) 200  MG tablet Take 1 tablet (200 mg total) by mouth daily. 90 tablet 3  . apixaban (ELIQUIS) 5 MG TABS tablet Take 1 tablet (5 mg total) by mouth 2 (two) times daily. 180 tablet 3  . fluticasone (FLONASE) 50 MCG/ACT nasal spray Place 1 spray into both nostrils 2  (two) times daily. (Patient taking differently: Place 1 spray into both nostrils 2 (two) times daily as needed for allergies. ) 16 g 2  . furosemide (LASIX) 40 MG tablet Take 1 tablet (40 mg total) by mouth daily. (Patient taking differently: Take 20 mg by mouth daily. ) 90 tablet 3  . gabapentin (NEURONTIN) 100 MG capsule Pt to take 1 tab nightly may titrate up 1 pill every 3 days to max of 300 mg nightly 60 capsule 0  . letrozole (FEMARA) 2.5 MG tablet Take 1 tablet (2.5 mg total) by mouth daily. 90 tablet 3  . loratadine (CLARITIN) 10 MG tablet Take 10 mg by mouth daily.     . ondansetron (ZOFRAN) 8 MG tablet Take 1 tablet (8 mg total) by mouth every 8 (eight) hours as needed for nausea or vomiting. 20 tablet 2  . palbociclib (IBRANCE) 125 MG capsule Take 1 capsule (125 mg total) by mouth daily with breakfast. Take whole with food. 21 capsule 3  . potassium chloride (K-DUR) 10 MEQ tablet Take 1 tablet (10 mEq total) by mouth as directed. 90 tablet 3  . pravastatin (PRAVACHOL) 40 MG tablet Take 1 tablet (40 mg total) by mouth every evening. (Patient taking differently: Take 40 mg by mouth every evening. Pt finishing her bottle of simvastatin before switching to pravastatin) 90 tablet 3  . traMADol (ULTRAM) 50 MG tablet Take 50-100 mg by mouth every 8 (eight) hours as needed for moderate pain.    . vitamin B-12 (CYANOCOBALAMIN) 1000 MCG tablet Take 1,000 mcg by mouth 2 (two) times daily.    . vitamin C (ASCORBIC ACID) 500 MG tablet Take 500 mg by mouth daily.      . vitamin E 200 UNIT capsule Take 200 Units by mouth daily.      [name] gabapentin (NEURONTIN) 300 MG capsule Take 1 capsule (300 mg total) by mouth at bedtime. (Patient not taking: Reported on 10/25/2016) 30 capsule 1   No facility-administered medications prior to visit.      Allergies:   Diphenhydramine hcl; Codeine sulfate; Metoprolol; Oseltamivir phosphate; Oxycodone-acetaminophen; Prednisone; and Sudafed [pseudoephedrine hcl]   Social  History   Social History  . Marital status: Single    Spouse name: N/A  . Number of children: 0  . Years of education: N/A   Occupational History  . Administraton for [employer]    Social History Main Topics  . Smoking status: Former Smoker    Packs/day: 0.50    Years: 30.00    Types: Cigarettes    Quit date: 07/22/1996  . Smokeless tobacco: Never Used     Comment: 1/2 up to 1 ppd  . Alcohol use No     Comment: hx of alcohol socially - hasn't had drink since March 1998  . Drug use: No  . Sexual activity: Not Asked   Other Topics Concern  . None   Social History Narrative  . None     Family History:  The patient's family history includes Aortic stenosis in her maternal uncle; Asthma in her father; Breast cancer in her cousin; Breast cancer (age of onset: 22) in her cousin; Diabetes in her paternal grandmother;  Heart attack (age of onset: 4) in her maternal grandfather; Heart disease in her mother; Prostate cancer in her maternal uncle, maternal uncle, and maternal uncle; Prostate cancer (age of onset: 2) in her maternal uncle.   ROS:   Please see the history of present illness.    ROS All other systems reviewed and are negative.   PHYSICAL EXAM:   VS:  BP 112/68 (BP Location: Left Arm, Patient Position: Sitting, Cuff Size: Normal)   Pulse 75   Ht 5\' 2"  (1.575 m)   Wt 73.5 kg (162 lb)   SpO2 97%   BMI 29.63 kg/m    GEN: Well nourished, well developed, in no acute distress  HEENT: normal  Neck: no JVD, carotid bruits, or masses Cardiac: RRR; no murmurs, rubs, or gallops, Trivial bilateral edema  Respiratory:  clear to auscultation bilaterally, normal work of breathing GI: soft, nontender, nondistended, + BS MS: no deformity or atrophy  Skin: warm and dry, no rash Neuro:  Alert and Oriented x 3, Strength and sensation are intact Psych: euthymic mood, full affect  Wt Readings from Last 3 Encounters:  10/25/16 73.5 kg (162 lb)  10/15/16 73.4 kg  (161 lb 14.4 oz)  09/24/16 74 kg (163 lb 3.2 oz)      Studies/Labs Reviewed:   EKG:  EKG is not ordered today.   Recent Labs: 05/22/2016: TSH 1.856 05/31/2016: Magnesium 2.1 07/25/2016: B Natriuretic Peptide 1,119.9 10/15/2016: ALT 26; BUN 19.6; Creatinine 1.1; HGB 13.9; Platelets 174; Potassium 3.8; Sodium 140   Lipid Panel    Component Value Date/Time   CHOL 134 05/23/2016 0304   CHOL 133 11/09/2015 1104   TRIG 247 (H) 05/23/2016 0304   TRIG 238 (H) 11/09/2015 1104   HDL 31 (L) 05/23/2016 0304   HDL 43 11/09/2015 1104   CHOLHDL 4.3 05/23/2016 0304   VLDL 49 (H) 05/23/2016 0304   LDLCALC 54 05/23/2016 0304   LDLCALC 42 11/09/2015 1104   LDLDIRECT 75.4 06/27/2008 0931    Additional studies/ records that were reviewed today include:  Records from recent visits in Oncology clinic   ASSESSMENT:    1. Chronic diastolic CHF (congestive heart failure) (HCC)   2. Paroxysmal atrial fibrillation ([place])   3. Pericardial effusion - small by TEE 05/27/16   4. Essential hypertension   5. Dyslipidemia   6. On amiodarone therapy   7. Chronic anticoagulation      PLAN:  In order of problems listed above:  1. CHF: She appears to be clinically euvolemic. Mild ankle edema is chronic. No changes in diuretic dose recommended. Reminded about the importance of sodium restriction, especially while on high-dose steroids. 2. AFib: Maintaining normal rhythm. She should continue anticoagulation. CHADSVasc 3 (age, gender, HTN). She had poor ventricular rate control during the recent episode of atrial fibrillation. She did not feel particularly ill, but is at risk of heart failure decompensation with lengthy spells of persistent atrial fibrillation. In the future, if she notes the irregular rhythm, would try to organize cardioversion in the endoscopy department, avoiding the emergency room.  3. Pericardial effusion: Small pericardial effusion when she initially presented with atrial fibrillation.  Check a follow up due to  possibility of neoplastic pericardial involvement. 4. HTN: Well controlled 5. HLP: Recheck before next visit after switch to pravastatin 6. Amiodarone: Check liver and thyroid labs before her next appointment. 7. Eliquis: Well-tolerated, with only minor nuisance bleeding    Medication Adjustments/Labs and Tests Ordered: Current medicines are reviewed at length  with the patient today.  Concerns regarding medicines are outlined above.  Medication changes, Labs and Tests ordered today are listed in the Patient Instructions below. Patient Instructions  Medication Instructions: Dr Sallyanne Kuster recommends that you continue on your current medications as directed. Please refer to the Current Medication list given to you today.  Labwork: Your physician recommends that you return for lab work in 6 months just before you come back to see Dr C.  Testing/Procedures: 1. Limited Echocardiogram on 12/11/16 - Your physician has requested that you have an echocardiogram. Echocardiography is a painless test that uses sound waves to create images of your heart. It provides your doctor with information about the size and shape of your heart and how well your heart's chambers and valves are working. This procedure takes approximately one hour. There are no restrictions for this procedure.  Follow-up: Dr Crotioru recommends that you schedule a follow-up appointment in 6 months. You will receive a reminder letter in the mail two months in advance. If you don't receive a letter, please call our office to schedule the follow-up appointment.  If you need a refill on your cardiac medications before your next appointment, please call your pharmacy.    Signed, [name], MD  10/25/2016 10:21 AM    [facility], [place], [place]  [address] Phone: [phone]; Fax: [phone]

## 2016-10-28 MED FILL — LOSARTAN POTASSIUM 50 MG TA: 50 | 90 days supply | Qty: 180 | Fill #0

## 2016-11-01 ENCOUNTER — Ambulatory Visit (HOSPITAL_BASED_OUTPATIENT_CLINIC_OR_DEPARTMENT_OTHER): Payer: Medicare Other | Admitting: Adult Health

## 2016-11-01 ENCOUNTER — Other Ambulatory Visit (HOSPITAL_BASED_OUTPATIENT_CLINIC_OR_DEPARTMENT_OTHER): Payer: Medicare Other

## 2016-11-01 ENCOUNTER — Encounter: Payer: Self-pay | Admitting: Adult Health

## 2016-11-01 VITALS — BP 155/61 | HR 80 | Temp 98.3°F | Resp 18 | Ht 62.0 in | Wt 166.0 lb

## 2016-11-01 DIAGNOSIS — C782 Secondary malignant neoplasm of pleura: Secondary | ICD-10-CM

## 2016-11-01 DIAGNOSIS — N183 Chronic kidney disease, stage 3 (moderate): Secondary | ICD-10-CM

## 2016-11-01 DIAGNOSIS — B0229 Other postherpetic nervous system involvement: Secondary | ICD-10-CM

## 2016-11-01 DIAGNOSIS — C50911 Malignant neoplasm of unspecified site of right female breast: Secondary | ICD-10-CM

## 2016-11-01 DIAGNOSIS — C50919 Malignant neoplasm of unspecified site of unspecified female breast: Secondary | ICD-10-CM | POA: Diagnosis not present

## 2016-11-01 LAB — CBC WITH DIFFERENTIAL/PLATELET
BASO%: 2.1 % — ABNORMAL HIGH (ref 0.0–2.0)
Basophils Absolute: 0 10*3/uL (ref 0.0–0.1)
EOS%: 3.7 % (ref 0.0–7.0)
Eosinophils Absolute: 0.1 10*3/uL (ref 0.0–0.5)
HCT: 38.7 % (ref 34.8–46.6)
HEMOGLOBIN: 13.4 g/dL (ref 11.6–15.9)
LYMPH#: 0.8 10*3/uL — AB (ref 0.9–3.3)
LYMPH%: 39.9 % (ref 14.0–49.7)
MCH: 30.2 pg (ref 25.1–34.0)
MCHC: 34.6 g/dL (ref 31.5–36.0)
MCV: 87.2 fL (ref 79.5–101.0)
MONO#: 0.2 10*3/uL (ref 0.1–0.9)
MONO%: 8 % (ref 0.0–14.0)
NEUT#: 0.9 10*3/uL — ABNORMAL LOW (ref 1.5–6.5)
NEUT%: 46.3 % (ref 38.4–76.8)
NRBC: 0 % (ref 0–0)
Platelets: 119 10*3/uL — ABNORMAL LOW (ref 145–400)
RBC: 4.44 10*6/uL (ref 3.70–5.45)
RDW: 16.8 % — AB (ref 11.2–14.5)
WBC: 1.9 10*3/uL — ABNORMAL LOW (ref 3.9–10.3)

## 2016-11-01 LAB — COMPREHENSIVE METABOLIC PANEL
ALBUMIN: 3.9 g/dL (ref 3.5–5.0)
ALK PHOS: 74 U/L (ref 40–150)
ALT: 25 U/L (ref 0–55)
AST: 30 U/L (ref 5–34)
Anion Gap: 8 mEq/L (ref 3–11)
BUN: 23.9 mg/dL (ref 7.0–26.0)
CO2: 23 mEq/L (ref 22–29)
CREATININE: 1.2 mg/dL — AB (ref 0.6–1.1)
Calcium: 9.6 mg/dL (ref 8.4–10.4)
Chloride: 108 mEq/L (ref 98–109)
EGFR: 46 mL/min/{1.73_m2} — AB (ref >90)
GLUCOSE: 96 mg/dL (ref 70–140)
Potassium: 4.2 mEq/L (ref 3.5–5.1)
SODIUM: 139 meq/L (ref 136–145)
TOTAL PROTEIN: 7 g/dL (ref 6.4–8.3)
Total Bilirubin: 0.7 mg/dL (ref 0.20–1.20)

## 2016-11-01 MED ORDER — GABAPENTIN 100 MG PO CAPS: ORAL_CAPSULE | ORAL | 0 refills | Status: DC

## 2016-11-01 NOTE — Progress Notes (Signed)
Patient Care Team: [name], MD as PCP - General (Internal Medicine)  DIAGNOSIS:  Encounter Diagnoses  Name Primary?  . Post herpetic neuralgia Yes  . 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 [facility])   1998 Initial Diagnosis    Right breast carcinoma T2N1 ER/PR positive at right mastectomy with node evaluation Dec 1998, treated with adjuvant adriamycin cytoxan followed by 5 years of tamoxifen      2010 Relapse/Recurrence    Metastatic breast cancer to the pleura      08/29/2008 - 08/30/2015 Anti-estrogen oral therapy    Aromasin followed by letrozole      07/2015 Relapse/Recurrence    Progression to bone required aggressive laminectomy February 2017      08/30/2015 - 09/13/2015 Radiation Therapy    Palliative radiation to the spine,  Right ilium ad right hip      01/05/2016 - 04/29/2016 Chemotherapy    Xeloda stopped when she had progression of dsease       05/02/2016 PET scan     innumerable hypermetabolic lesions throughout the axial and appendicular skeleton, increase in size and number of new lesions in skull, right scapular glenoid, throughout sacrum. Findings concerning for liver metastases, numerous pulmonary nodules bilaterally      05/09/2016 -  Chemotherapy    Taxol weekly initially and later changed her day 1 day 8 every 3 weeks (complicated by A. fib with RVR and prolonged respiratory infection) along with Xgeva       09/19/2016 Procedure    Biopsy of lung mass: Metastatic carcinoma consistent with breast origin, HER-2 negative ratio 1.37       CHIEF COMPLIANT: Faslodex,Palbociclib  INTERVAL HISTORY: Lisa [name] is a 76 year old with above-mentioned history of metastatic breast cancer.  She is doing well today.  She started on Palbociclib 2 weeks ago and is tolerating it well.  She denies any difficulties with it.  She continues to have tremendous  post herpetic neuralgia now on week 6.  She is taking gabapentin 290m at night and wants to know if she can take any during the day.  She has taken 3051mof gabapentin and it causes her to be dizzy, so she doesn't want to do that again.    REVIEW OF SYSTEMS:   Review of Systems  Constitutional: Negative for chills, diaphoresis, fever, malaise/fatigue and weight loss.  HENT: Negative for hearing loss and tinnitus.   Eyes: Negative for blurred vision and double vision.  Respiratory: Negative for cough and shortness of breath.   Cardiovascular: Negative for chest pain, palpitations and leg swelling.  Gastrointestinal: Negative for abdominal pain, blood in stool, constipation, diarrhea, heartburn, melena, nausea and vomiting.  Skin: Negative for rash.  Neurological: Negative for dizziness, tingling, weakness and headaches.     I have reviewed the past medical history, past surgical history, social history and family history with the patient and they are unchanged from previous note.  ALLERGIES:  is allergic to diphenhydramine hcl; codeine sulfate; metoprolol; oseltamivir phosphate; oxycodone-acetaminophen; prednisone; and sudafed [pseudoephedrine hcl].  MEDICATIONS:  Current Outpatient Prescriptions  Medication Sig Dispense Refill  . acetaminophen (TYLENOL) 500 MG tablet Take 500 mg by mouth every 6 (six) hours as needed for moderate pain.    . Marland Kitchenmiodarone (PACERONE) 200 MG tablet Take 1 tablet (200 mg total) by mouth daily. 90 tablet 3  . apixaban (ELIQUIS) 5 MG TABS tablet Take 1  tablet (5 mg total) by mouth 2 (two) times daily. 180 tablet 3  . fluticasone (FLONASE) 50 MCG/ACT nasal spray Place 1 spray into both nostrils 2 (two) times daily. (Patient taking differently: Place 1 spray into both nostrils 2 (two) times daily as needed for allergies. ) 16 g 2  . furosemide (LASIX) 40 MG tablet Take 1 tablet (40 mg total) by mouth daily. (Patient taking differently: Take 20 mg by mouth daily. ) 90  tablet 3  . gabapentin (NEURONTIN) 100 MG capsule Pt to take 1 tab nightly may titrate up 1 pill every 3 days to max of 300 mg nightly 90 capsule 0  . letrozole (FEMARA) 2.5 MG tablet Take 1 tablet (2.5 mg total) by mouth daily. 90 tablet 3  . loratadine (CLARITIN) 10 MG tablet Take 10 mg by mouth daily.     [name] losartan (COZAAR) 100 MG tablet Take 100 mg by mouth daily.    . ondansetron (ZOFRAN) 8 MG tablet Take 1 tablet (8 mg total) by mouth every 8 (eight) hours as needed for nausea or vomiting. 20 tablet 2  . palbociclib (IBRANCE) 125 MG capsule Take 1 capsule (125 mg total) by mouth daily with breakfast. Take whole with food. 21 capsule 3  . potassium chloride (K-DUR) 10 MEQ tablet Take 1 tablet (10 mEq total) by mouth as directed. 90 tablet 3  . pravastatin (PRAVACHOL) 40 MG tablet Take 1 tablet (40 mg total) by mouth every evening. (Patient taking differently: Take 40 mg by mouth every evening. Pt finishing her bottle of simvastatin before switching to pravastatin) 90 tablet 3  . traMADol (ULTRAM) 50 MG tablet Take 50-100 mg by mouth every 8 (eight) hours as needed for moderate pain.    . vitamin B-12 (CYANOCOBALAMIN) 1000 MCG tablet Take 1,000 mcg by mouth 2 (two) times daily.    . vitamin C (ASCORBIC ACID) 500 MG tablet Take 500 mg by mouth daily.      . vitamin E 200 UNIT capsule Take 200 Units by mouth daily.       No current facility-administered medications for this visit.     PHYSICAL EXAMINATION: ECOG PERFORMANCE STATUS: 1 - Symptomatic but completely ambulatory  Vitals:   11/01/16 1422  BP: (!) 155/61  Pulse: 80  Resp: 18  Temp: 98.3 F (36.8 C)   Filed Weights   11/01/16 1422  Weight: 166 lb (75.3 kg)  GENERAL: Patient is a well appearing female in no acute distress HEENT:  Sclerae anicteric.  Oropharynx clear and moist. No ulcerations or evidence of oropharyngeal candidiasis. Neck is supple.  NODES:  Deferred due to left neck and supraclavicular post herpetic  neuralgia BREAST EXAM:  Deferred. LUNGS:  Clear to auscultation bilaterally.  No wheezes or rhonchi. HEART:  Regular rate and rhythm. No murmur appreciated. ABDOMEN:  Soft, nontender.  Positive, normoactive bowel sounds. No organomegaly palpated. MSK:  No focal spinal tenderness to palpation. Full range of motion bilaterally in the upper extremities. EXTREMITIES:  No peripheral edema.   SKIN:  Clear with no obvious rashes or skin changes. No nail dyscrasia. NEURO:  Nonfocal. Well oriented.  Appropriate affect.    LABORATORY DATA:  I have reviewed the data as listed   Chemistry      Component Value Date/Time   NA 139 11/01/2016 1343   K 4.2 11/01/2016 1343   CL 106 07/25/2016 1457   CL 108 (H) 01/13/2013 0806   CO2 23 11/01/2016 1343   BUN 23.9 11/01/2016  1343   CREATININE 1.2 (H) 11/01/2016 1343   GLU 114 09/12/2015      Component Value Date/Time   CALCIUM 9.6 11/01/2016 1343   ALKPHOS 74 11/01/2016 1343   AST 30 11/01/2016 1343   ALT 25 11/01/2016 1343   BILITOT 0.70 11/01/2016 1343       Lab Results  Component Value Date   WBC 1.9 (L) 11/01/2016   HGB 13.4 11/01/2016   HCT 38.7 11/01/2016   MCV 87.2 11/01/2016   PLT 119 Large platelets present (L) 11/01/2016   NEUTROABS 0.9 (L) 11/01/2016    ASSESSMENT & PLAN:  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 Sallyanne Kuster) CKD stage III Pathologic compression fracture T10 status post radiation and laminectomy T6-L1  09/01/2015, radiation to right iliac area 05/2016  Rebiopsy of the lung nodule 09/19/2016: Metastatic breast cancer that is HER-2 negative, ER/PR pending  Current treatment: Letrozole Started 10/01/2016 with Palbociclib started 10/15/2016   Patient [place] today is 900, unfortunately we will need to hold the Palbociclib.  She will continue the letrozole daily.  Jina will return in one week for labs and evaluation.  She will take Gabapentin 123m in the morning and in the afternoon if needed, along with 2046mat night.  She knows to call me if she needs to go up on the dose at all.    I spent 25 minutes talking to the patient of which more than half was spent in counseling and coordination of care.   The patient has a good understanding of the overall plan. she agrees with it. she will call with any problems that may develop before the next visit here.   LiScot DockNP 11/01/16

## 2016-11-02 MED FILL — GABAPENTIN 100 MG CAPSULE: 100 | 30 days supply | Qty: 90 | Fill #0

## 2016-11-08 ENCOUNTER — Encounter: Payer: Self-pay | Admitting: Adult Health

## 2016-11-08 ENCOUNTER — Ambulatory Visit (HOSPITAL_BASED_OUTPATIENT_CLINIC_OR_DEPARTMENT_OTHER): Payer: Medicare Other | Admitting: Adult Health

## 2016-11-08 ENCOUNTER — Other Ambulatory Visit (HOSPITAL_BASED_OUTPATIENT_CLINIC_OR_DEPARTMENT_OTHER): Payer: Medicare Other

## 2016-11-08 VITALS — BP 162/72 | HR 78 | Temp 98.1°F | Resp 18 | Ht 62.0 in | Wt 162.0 lb

## 2016-11-08 DIAGNOSIS — C782 Secondary malignant neoplasm of pleura: Secondary | ICD-10-CM | POA: Diagnosis not present

## 2016-11-08 DIAGNOSIS — C50911 Malignant neoplasm of unspecified site of right female breast: Secondary | ICD-10-CM

## 2016-11-08 DIAGNOSIS — D709 Neutropenia, unspecified: Secondary | ICD-10-CM

## 2016-11-08 DIAGNOSIS — C7800 Secondary malignant neoplasm of unspecified lung: Secondary | ICD-10-CM | POA: Diagnosis not present

## 2016-11-08 DIAGNOSIS — C7951 Secondary malignant neoplasm of bone: Secondary | ICD-10-CM

## 2016-11-08 LAB — COMPREHENSIVE METABOLIC PANEL
ALT: 22 U/L (ref 0–55)
ANION GAP: 12 meq/L — AB (ref 3–11)
AST: 25 U/L (ref 5–34)
Albumin: 3.7 g/dL (ref 3.5–5.0)
Alkaline Phosphatase: 81 U/L (ref 40–150)
BILIRUBIN TOTAL: 0.61 mg/dL (ref 0.20–1.20)
BUN: 18 mg/dL (ref 7.0–26.0)
CALCIUM: 10.2 mg/dL (ref 8.4–10.4)
CO2: 22 meq/L (ref 22–29)
CREATININE: 0.9 mg/dL (ref 0.6–1.1)
Chloride: 109 mEq/L (ref 98–109)
EGFR: 64 mL/min/{1.73_m2} — ABNORMAL LOW (ref >90)
Glucose: 102 mg/dl (ref 70–140)
Potassium: 3.9 mEq/L (ref 3.5–5.1)
Sodium: 143 mEq/L (ref 136–145)
TOTAL PROTEIN: 6.8 g/dL (ref 6.4–8.3)

## 2016-11-08 LAB — CBC WITH DIFFERENTIAL/PLATELET
BASO%: 1.6 % (ref 0.0–2.0)
Basophils Absolute: 0 10*3/uL (ref 0.0–0.1)
EOS ABS: 0 10*3/uL (ref 0.0–0.5)
EOS%: 1.6 % (ref 0.0–7.0)
HEMATOCRIT: 40.8 % (ref 34.8–46.6)
HGB: 13.6 g/dL (ref 11.6–15.9)
LYMPH#: 0.6 10*3/uL — AB (ref 0.9–3.3)
LYMPH%: 41.5 % (ref 14.0–49.7)
MCH: 29.9 pg (ref 25.1–34.0)
MCHC: 33.3 g/dL (ref 31.5–36.0)
MCV: 89.6 fL (ref 79.5–101.0)
MONO#: 0.2 10*3/uL (ref 0.1–0.9)
MONO%: 11.7 % (ref 0.0–14.0)
NEUT%: 43.6 % (ref 38.4–76.8)
NEUTROS ABS: 0.6 10*3/uL — AB (ref 1.5–6.5)
PLATELETS: 152 10*3/uL (ref 145–400)
RBC: 4.55 10*6/uL (ref 3.70–5.45)
RDW: 18.6 % — ABNORMAL HIGH (ref 11.2–14.5)
WBC: 1.4 10*3/uL — ABNORMAL LOW (ref 3.9–10.3)

## 2016-11-08 NOTE — Progress Notes (Signed)
Patient Care Team: [name], MD as PCP - General (Internal Medicine)  DIAGNOSIS:  Encounter Diagnosis  Name Primary?  . 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      08/29/2008 - 08/30/2015 Anti-estrogen oral therapy    Aromasin followed by letrozole      07/2015 Relapse/Recurrence    Progression to bone required aggressive laminectomy February 2017      08/30/2015 - 09/13/2015 Radiation Therapy    Palliative radiation to the spine,  Right ilium ad right hip      01/05/2016 - 04/29/2016 Chemotherapy    Xeloda stopped when she had progression of dsease       05/02/2016 PET scan     innumerable hypermetabolic lesions throughout the axial and appendicular skeleton, increase in size and number of new lesions in skull, right scapular glenoid, throughout sacrum. Findings concerning for liver metastases, numerous pulmonary nodules bilaterally      05/09/2016 -  Chemotherapy    Taxol weekly initially and later changed her day 1 day 8 every 3 weeks (complicated by A. fib with RVR and prolonged respiratory infection) along with Xgeva       09/19/2016 Procedure    Biopsy of lung mass: Metastatic carcinoma consistent with breast origin, HER-2 negative ratio 1.37       CHIEF COMPLIANT: Letrozole ,Palbociclib  INTERVAL HISTORY: Lisa [name] is a 76 year old with above-mentioned history of metastatic breast cancer.  She is not feeling well today.  She is very cold today and she is miserable in the exam room.  She is having neuropathic pain from the shingles she had and it is flaring up right now tremendously.  She has  been taking the gabapentin during the day some and it has helped.  She is doing well otherwise.  She has not taken the Palbociclib, due to neutropenia for the past week.    REVIEW OF SYSTEMS:   Review of Systems  Constitutional: Negative for chills, diaphoresis, fever, malaise/fatigue and weight loss.  HENT: Negative for hearing loss and tinnitus.   Eyes: Negative for blurred vision and double vision.  Respiratory: Negative for cough and shortness of breath.   Cardiovascular: Negative for chest pain, palpitations and leg swelling.  Gastrointestinal: Negative for abdominal pain, blood in stool, constipation, diarrhea, heartburn, melena, nausea and vomiting.  Skin: Negative for rash.  Neurological: Negative for dizziness, tingling, weakness and headaches.     I have reviewed the past medical history, past surgical history, social history and family history with the patient and they are unchanged from previous note.  ALLERGIES:  is allergic to diphenhydramine hcl; codeine sulfate; metoprolol; oseltamivir phosphate; oxycodone-acetaminophen; prednisone; and sudafed [pseudoephedrine hcl].  MEDICATIONS:  Current Outpatient Prescriptions  Medication Sig Dispense Refill  . acetaminophen (TYLENOL) 500 MG tablet Take 500 mg by mouth every 6 (six) hours as needed for moderate pain.    [name] amiodarone (PACERONE) 200 MG tablet Take 1 tablet (200 mg total) by mouth daily. 90 tablet 3  . apixaban (ELIQUIS) 5 MG TABS tablet Take 1 tablet (5 mg total) by mouth 2 (two) times daily. [place]  tablet 3  . fluticasone (FLONASE) 50 MCG/ACT nasal spray Place 1 spray into both nostrils 2 (two) times daily. (Patient taking differently: Place 1 spray into both nostrils 2 (two) times daily as needed for allergies. ) 16 g 2  . furosemide (LASIX) 40 MG tablet Take 1 tablet (40 mg total) by mouth daily. (Patient taking differently: Take 20 mg by mouth daily. ) 90 tablet 3  . gabapentin (NEURONTIN) 100 MG capsule Pt to take 1 tab  nightly may titrate up 1 pill every 3 days to max of 300 mg nightly 90 capsule 0  . letrozole (FEMARA) 2.5 MG tablet Take 1 tablet (2.5 mg total) by mouth daily. 90 tablet 3  . loratadine (CLARITIN) 10 MG tablet Take 10 mg by mouth daily.     [name] losartan (COZAAR) 100 MG tablet Take 100 mg by mouth daily.    . ondansetron (ZOFRAN) 8 MG tablet Take 1 tablet (8 mg total) by mouth every 8 (eight) hours as needed for nausea or vomiting. 20 tablet 2  . palbociclib (IBRANCE) 125 MG capsule Take 1 capsule (125 mg total) by mouth daily with breakfast. Take whole with food. 21 capsule 3  . potassium chloride (K-DUR) 10 MEQ tablet Take 1 tablet (10 mEq total) by mouth as directed. 90 tablet 3  . pravastatin (PRAVACHOL) 40 MG tablet Take 1 tablet (40 mg total) by mouth every evening. (Patient taking differently: Take 40 mg by mouth every evening. Pt finishing her bottle of simvastatin before switching to pravastatin) 90 tablet 3  . traMADol (ULTRAM) 50 MG tablet Take 50-100 mg by mouth every 8 (eight) hours as needed for moderate pain.    . vitamin B-12 (CYANOCOBALAMIN) 1000 MCG tablet Take 1,000 mcg by mouth 2 (two) times daily.    . vitamin C (ASCORBIC ACID) 500 MG tablet Take 500 mg by mouth daily.      . vitamin E 200 UNIT capsule Take 200 Units by mouth daily.       No current facility-administered medications for this visit.     PHYSICAL EXAMINATION: ECOG PERFORMANCE STATUS: 1 - Symptomatic but completely ambulatory  Vitals:   11/08/16 1337  BP: (!) 162/72  Pulse: 78  Resp: 18  Temp: 98.1 F (36.7 C)   Filed Weights   11/08/16 1337  Weight: 162 lb (73.5 kg)  GENERAL: Patient is a well appearing female who is shivering and covered in blankets HEENT:  Sclerae anicteric.  Oropharynx clear and moist. No ulcerations or evidence of oropharyngeal candidiasis. Neck is supple.  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 LUNGS:  Clear to  auscultation bilaterally.  No wheezes or rhonchi. HEART:  Regular rate and rhythm. No murmur appreciated. ABDOMEN:  Soft, nontender, exam difficult due to amount of shivering and patient stayed sitting up in chair today EXTREMITIES:  No peripheral edema.   SKIN:  Clear with no obvious rashes or skin changes. No nail dyscrasia. NEURO:  Nonfocal. Well oriented.  Appropriate affect.    LABORATORY DATA:  I have reviewed the data as listed   Chemistry      Component Value Date/Time   NA 139 11/01/2016 1343   K 4.2 11/01/2016 1343   CL 106 07/25/2016 1457   CL 108 (H) 01/13/2013 0806   CO2 23 11/01/2016 1343   BUN 23.9 11/01/2016 1343   CREATININE 1.2 (H) 11/01/2016 1343   GLU 114 09/12/2015  Component Value Date/Time   CALCIUM 9.6 11/01/2016 1343   ALKPHOS 74 11/01/2016 1343   AST 30 11/01/2016 1343   ALT 25 11/01/2016 1343   BILITOT 0.70 11/01/2016 1343       Lab Results  Component Value Date   WBC 1.4 (L) 11/08/2016   HGB 13.6 11/08/2016   HCT 40.8 11/08/2016   MCV 89.6 11/08/2016   PLT 152 11/08/2016   NEUTROABS 0.6 (L) 11/08/2016    ASSESSMENT & PLAN:  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 Sallyanne Kuster) CKD stage III Pathologic compression fracture T10 status post radiation and laminectomy T6-L1 09/01/2015, radiation to right iliac area 05/2016  Rebiopsy of the lung nodule 09/19/2016: Metastatic breast cancer that is HER-2 negative, ER/PR pending  Current  treatment: Letrozole Started 10/01/2016 with Palbociclib started 10/15/2016 (currently on hold)  Neutropenia: Patient [place] is now 81, she will hold off on Palbociclib for one more week and we will re evaluate next week.    A total of (20) minutes of face-to-face time was spent with this patient with greater than 50% of that time in counseling and care-coordination.   The patient has a good understanding of the overall plan. she agrees with it. she will call with any problems that may develop before the next visit here.   Scot Dock, NP 11/08/16

## 2016-11-09 MED FILL — AMIODARONE HCL 200 MG TAB: 200 | 90 days supply | Qty: 90 | Fill #1

## 2016-11-09 MED FILL — POTASSIUM CL 10 MEQ TAB SA: 10 | 90 days supply | Qty: 90 | Fill #1

## 2016-11-11 MED FILL — ELIQUIS 5 MG TABLET: 5 | 90 days supply | Qty: 180 | Fill #1

## 2016-11-15 ENCOUNTER — Other Ambulatory Visit: Payer: Self-pay | Admitting: Cardiovascular Disease

## 2016-11-15 ENCOUNTER — Other Ambulatory Visit: Payer: Self-pay

## 2016-11-15 ENCOUNTER — Telehealth: Payer: Self-pay

## 2016-11-15 ENCOUNTER — Other Ambulatory Visit: Payer: Medicare Other

## 2016-11-15 ENCOUNTER — Other Ambulatory Visit (HOSPITAL_BASED_OUTPATIENT_CLINIC_OR_DEPARTMENT_OTHER): Payer: Medicare Other

## 2016-11-15 ENCOUNTER — Telehealth: Payer: Self-pay | Admitting: Hematology and Oncology

## 2016-11-15 ENCOUNTER — Ambulatory Visit: Payer: Medicare Other | Admitting: Adult Health

## 2016-11-15 ENCOUNTER — Encounter: Payer: Self-pay | Admitting: Hematology and Oncology

## 2016-11-15 ENCOUNTER — Ambulatory Visit (HOSPITAL_BASED_OUTPATIENT_CLINIC_OR_DEPARTMENT_OTHER): Payer: Medicare Other | Admitting: Hematology and Oncology

## 2016-11-15 VITALS — BP 134/70 | HR 81 | Temp 97.8°F | Resp 18 | Ht 62.0 in | Wt 162.2 lb

## 2016-11-15 DIAGNOSIS — C50911 Malignant neoplasm of unspecified site of right female breast: Secondary | ICD-10-CM

## 2016-11-15 DIAGNOSIS — C50919 Malignant neoplasm of unspecified site of unspecified female breast: Secondary | ICD-10-CM

## 2016-11-15 DIAGNOSIS — C7951 Secondary malignant neoplasm of bone: Secondary | ICD-10-CM

## 2016-11-15 DIAGNOSIS — C782 Secondary malignant neoplasm of pleura: Principal | ICD-10-CM

## 2016-11-15 LAB — COMPREHENSIVE METABOLIC PANEL
ALK PHOS: 78 U/L (ref 40–150)
ALT: 21 U/L (ref 0–55)
ANION GAP: 11 meq/L (ref 3–11)
AST: 27 U/L (ref 5–34)
Albumin: 3.9 g/dL (ref 3.5–5.0)
BUN: 21 mg/dL (ref 7.0–26.0)
CALCIUM: 10 mg/dL (ref 8.4–10.4)
CO2: 24 meq/L (ref 22–29)
Chloride: 105 mEq/L (ref 98–109)
Creatinine: 1.1 mg/dL (ref 0.6–1.1)
EGFR: 51 mL/min/{1.73_m2} — ABNORMAL LOW (ref >90)
GLUCOSE: 120 mg/dL (ref 70–140)
POTASSIUM: 4 meq/L (ref 3.5–5.1)
Sodium: 140 mEq/L (ref 136–145)
Total Bilirubin: 0.64 mg/dL (ref 0.20–1.20)
Total Protein: 7 g/dL (ref 6.4–8.3)

## 2016-11-15 LAB — CBC WITH DIFFERENTIAL/PLATELET
BASO%: 2.4 % — AB (ref 0.0–2.0)
BASOS ABS: 0.1 10*3/uL (ref 0.0–0.1)
EOS%: 1.4 % (ref 0.0–7.0)
Eosinophils Absolute: 0 10*3/uL (ref 0.0–0.5)
HEMATOCRIT: 35.9 % (ref 34.8–46.6)
HGB: 12.3 g/dL (ref 11.6–15.9)
LYMPH#: 0.7 10*3/uL — AB (ref 0.9–3.3)
LYMPH%: 34.4 % (ref 14.0–49.7)
MCH: 30.4 pg (ref 25.1–34.0)
MCHC: 34.3 g/dL (ref 31.5–36.0)
MCV: 88.6 fL (ref 79.5–101.0)
MONO#: 0.4 10*3/uL (ref 0.1–0.9)
MONO%: 18.2 % — ABNORMAL HIGH (ref 0.0–14.0)
NEUT#: 0.9 10*3/uL — ABNORMAL LOW (ref 1.5–6.5)
NEUT%: 43.6 % (ref 38.4–76.8)
Platelets: 230 10*3/uL (ref 145–400)
RBC: 4.05 10*6/uL (ref 3.70–5.45)
RDW: 17.9 % — ABNORMAL HIGH (ref 11.2–14.5)
WBC: 2.1 10*3/uL — ABNORMAL LOW (ref 3.9–10.3)

## 2016-11-15 LAB — DRAW EXTRA CLOT TUBE

## 2016-11-15 MED ORDER — PALBOCICLIB 100 MG PO CAPS: 100.0000 mg | ORAL_CAPSULE | Freq: Every day | ORAL | 3 refills | Status: DC

## 2016-11-15 NOTE — Telephone Encounter (Signed)
LC out - called patient called patient re reschedule 4/27 lab/fu. Per patient reschedule for 5/4 - date/time per patient.

## 2016-11-15 NOTE — Telephone Encounter (Signed)
s/w May RN and scheduled pt for 245 lab and 315 MD, inbasket sent.

## 2016-11-15 NOTE — Progress Notes (Signed)
Patient Care Team: [name], MD as PCP - General (Internal Medicine)  DIAGNOSIS:  Encounter Diagnosis  Name Primary?  . 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      08/29/2008 - 08/30/2015 Anti-estrogen oral therapy    Aromasin followed by letrozole      07/2015 Relapse/Recurrence    Progression to bone required aggressive laminectomy February 2017      08/30/2015 - 09/13/2015 Radiation Therapy    Palliative radiation to the spine,  Right ilium ad right hip      01/05/2016 - 04/29/2016 Chemotherapy    Xeloda stopped when she had progression of dsease       05/02/2016 PET scan     innumerable hypermetabolic lesions throughout the axial and appendicular skeleton, increase in size and number of new lesions in skull, right scapular glenoid, throughout sacrum. Findings concerning for liver metastases, numerous pulmonary nodules bilaterally      05/09/2016 -  Chemotherapy    Taxol weekly initially and later changed her day 1 day 8 every 3 weeks (complicated by A. fib with RVR and prolonged respiratory infection) along with Xgeva       09/19/2016 Procedure    Biopsy of lung mass: Metastatic carcinoma consistent with breast origin, HER-2 negative ratio 1.37      10/25/2016 -  Anti-estrogen oral therapy    Palbociclib and Letrozole       CHIEF COMPLIANT: Follow-up on Palbociclib  INTERVAL HISTORY: Lisa [name] is a 76 year old with above-mentioned symptoms metastatic breast cancer currently on Palbociclib and letrozole. She was markedly neutropenic and her treatment has been held. Today her [place] is still at 900.  She reports no new symptoms or concerns. Denies any fevers or chills.  REVIEW OF SYSTEMS:   Constitutional: Denies fevers, chills or abnormal weight loss Eyes: Denies blurriness of vision Ears, nose, mouth, throat, and face: Denies mucositis or sore throat Respiratory: Denies cough, dyspnea or wheezes Cardiovascular: Denies palpitation, chest discomfort Gastrointestinal:  Denies nausea, heartburn or change in bowel habits Skin: Denies abnormal skin rashes Lymphatics: Denies new lymphadenopathy or easy bruising Neurological:Denies numbness, tingling or new weaknesses Behavioral/Psych: Mood is stable, no new changes  Extremities: No lower extremity edema  All other systems were reviewed with the patient and are negative.  I have reviewed the past medical history, past surgical history, social history and family history with the patient and they are unchanged from previous note.  ALLERGIES:  is allergic to diphenhydramine hcl; codeine sulfate; metoprolol; oseltamivir phosphate; oxycodone-acetaminophen; prednisone; and sudafed [pseudoephedrine hcl].  MEDICATIONS:  Current Outpatient Prescriptions  Medication Sig Dispense Refill  . acetaminophen (TYLENOL) 500 MG tablet Take 500 mg by mouth every 6 (six) hours as needed for moderate pain.    [name] amiodarone (PACERONE) 200 MG tablet Take 1 tablet (200 mg total) by mouth daily. 90 tablet 3  . apixaban (ELIQUIS) 5 MG TABS tablet Take 1 tablet (5 mg total) by mouth 2 (two) times daily. 180 tablet 3  . fluticasone (FLONASE) 50 MCG/ACT nasal spray Place 1 spray into both nostrils 2 (two) times daily. (Patient taking differently:  Place 1 spray into both nostrils 2 (two) times daily as needed for allergies. ) 16 g 2  . furosemide (LASIX) 40 MG tablet Take 1 tablet (40 mg total) by mouth daily. (Patient taking differently: Take 20 mg by mouth daily. ) 90 tablet 3  . gabapentin (NEURONTIN) 100 MG capsule Pt to take 1 tab nightly may titrate up 1 pill every  3 days to max of 300 mg nightly 90 capsule 0  . letrozole (FEMARA) 2.5 MG tablet Take 1 tablet (2.5 mg total) by mouth daily. 90 tablet 3  . loratadine (CLARITIN) 10 MG tablet Take 10 mg by mouth daily.     [name] losartan (COZAAR) 100 MG tablet Take 100 mg by mouth daily.    . ondansetron (ZOFRAN) 8 MG tablet Take 1 tablet (8 mg total) by mouth every 8 (eight) hours as needed for nausea or vomiting. 20 tablet 2  . palbociclib (IBRANCE) 100 MG capsule Take 1 capsule (100 mg total) by mouth daily with breakfast. Take whole with food. 21 capsule 3  . potassium chloride (K-DUR) 10 MEQ tablet Take 1 tablet (10 mEq total) by mouth as directed. 90 tablet 3  . pravastatin (PRAVACHOL) 40 MG tablet Take 1 tablet (40 mg total) by mouth every evening. (Patient taking differently: Take 40 mg by mouth every evening. Pt finishing her bottle of simvastatin before switching to pravastatin) 90 tablet 3  . traMADol (ULTRAM) 50 MG tablet Take 50-100 mg by mouth every 8 (eight) hours as needed for moderate pain.    . vitamin B-12 (CYANOCOBALAMIN) 1000 MCG tablet Take 1,000 mcg by mouth 2 (two) times daily.    . vitamin C (ASCORBIC ACID) 500 MG tablet Take 500 mg by mouth daily.      . vitamin E 200 UNIT capsule Take 200 Units by mouth daily.       No current facility-administered medications for this visit.     PHYSICAL EXAMINATION: ECOG PERFORMANCE STATUS: 1 - Symptomatic but completely ambulatory  Vitals:   11/15/16 1432  BP: 134/70  Pulse: 81  Resp: 18  Temp: 97.8 F (36.6 C)   Filed Weights   11/15/16 1432  Weight: 162 lb 3.2 oz (73.6 kg)    GENERAL:alert, no distress and comfortable SKIN: skin color, texture, turgor are normal, no rashes or significant lesions EYES: normal, Conjunctiva are pink and non-injected, sclera clear OROPHARYNX:no exudate, no erythema and lips, buccal mucosa, and tongue normal  NECK: supple, thyroid normal size, non-tender, without nodularity LYMPH:  no palpable  lymphadenopathy in the cervical, axillary or inguinal LUNGS: clear to auscultation and percussion with normal breathing effort HEART: regular rate & rhythm and no murmurs and no lower extremity edema ABDOMEN:abdomen soft, non-tender and normal bowel sounds MUSCULOSKELETAL:no cyanosis of digits and no clubbing  NEURO: alert & oriented x 3 with fluent speech, no focal motor/sensory deficits EXTREMITIES: No lower extremity edema  LABORATORY DATA:  I have reviewed the data as listed   Chemistry      Component Value Date/Time   NA 140 11/15/2016 1404   K 4.0 11/15/2016 1404   CL 106 07/25/2016 1457   CL 108 (H) 01/13/2013 0806   CO2 24 11/15/2016 1404   BUN 21.0 11/15/2016 1404   CREATININE 1.1 11/15/2016 1404   GLU 114 09/12/2015      Component Value Date/Time   CALCIUM 10.0 11/15/2016 1404   ALKPHOS 78 11/15/2016 1404   AST 27 11/15/2016 1404   ALT 21 11/15/2016 1404  BILITOT 0.64 11/15/2016 1404       Lab Results  Component Value Date   WBC 2.1 (L) 11/15/2016   HGB 12.3 11/15/2016   HCT 35.9 11/15/2016   MCV 88.6 11/15/2016   PLT 230 11/15/2016   NEUTROABS 0.9 (L) 11/15/2016    ASSESSMENT & PLAN:  No problem-specific Assessment & Plan notes found for this encounter.   I spent 25 minutes talking to the patient of which more than half was spent in counseling and coordination of care.  Orders Placed This Encounter  Procedures  . CBC with Differential    Standing Status:   Future    Standing Expiration Date:   11/15/2017   The patient has a good understanding of the overall plan. she agrees with it. she will call with any problems that may develop before the next visit here.   Rulon Eisenmenger, MD 11/15/16

## 2016-11-15 NOTE — Telephone Encounter (Signed)
Confirmed 4/27 appt at 245 pm per sch msg

## 2016-11-15 NOTE — Telephone Encounter (Signed)
s/w pt that Dr Lindi Adie wants to see her today, not 5/4. To be expecting a call with time of appt.

## 2016-11-15 NOTE — Assessment & Plan Note (Signed)
Metastatic breast cancer tolungs and bone: Clinically improving onweekly taxol beginning 05-09-16, several delays including for counts, rapid A fib, respiratory infection etc  Treatment summary: Taxol days 1 and 8 every 3 weeks with Neupogen along with Xgeva; completed 4 cycles from 05/09/2016- 08/29/2016 Taxol toxicities: Neutropenia: Requiring Neupogen with each treatment  Atrial fibrillation: Recurrent with RVR, cardioverted 07/25/2016,  amiodarone, metoprolol and Lasix for CHF (Dr. Sallyanne Kuster) CKD stage III Pathologic compression fracture T10 status post radiation and laminectomy T6-L1 09/01/2015, radiation to right iliac area 05/2016  Rebiopsy of the lung nodule 09/19/2016: Metastatic breast cancer that is HER-2 negative,  -------------------------------------------------------------------------------------------------------------------------------------------- Current treatment: Letrozole with Palbociclib  started 10/01/2016  Palbociclib toxicities: 1. Neutropenia: We will decrease the dosage of Palbociclib 200 mg daily. She will start this in one week. Return to clinic in 3 weeks to recheck the blood counts and follow-up.

## 2016-11-19 ENCOUNTER — Telehealth: Payer: Self-pay | Admitting: Pharmacist

## 2016-11-19 NOTE — Telephone Encounter (Signed)
Oral Chemotherapy Pharmacist Encounter  Received notification from [facility] that patient's [employer] would require prior authorization. Noted patient received 1st fill with voucher and then Tx subsequently held due to neutropenia.  PA submitted on CoverMyMeds Key [id number] Status is pending  Oral Oncology Clinic will continue to follow  Lisa [name], PharmD, BCPS, BCOP 11/19/2016  11:54 AM Oral Oncology Clinic [phone]

## 2016-11-20 LAB — TSH: TSH: 4.56 u[IU]/mL — ABNORMAL HIGH (ref 0.450–4.500)

## 2016-11-20 LAB — PLEASE NOTE

## 2016-11-21 MED FILL — IBRANCE 100 MG CAPSULE: 100 | 21 days supply | Qty: 21 | Fill #0

## 2016-11-22 ENCOUNTER — Ambulatory Visit: Payer: Medicare Other | Admitting: Adult Health

## 2016-11-22 ENCOUNTER — Other Ambulatory Visit: Payer: Medicare Other

## 2016-11-22 ENCOUNTER — Telehealth: Payer: Self-pay

## 2016-11-22 DIAGNOSIS — Z79899 Other long term (current) drug therapy: Secondary | ICD-10-CM

## 2016-11-22 NOTE — Telephone Encounter (Signed)
-----   Message from [name], MD sent at 11/20/2016  1:06 PM EDT ----- Very mild abnormality in TSH, likely amiodarone related. Please check TSH and free T4 in 30 days or so - can synchronize with other labs that she has in Oncology clinic

## 2016-11-22 NOTE — Telephone Encounter (Signed)
Notes recorded by Diana Eves, CMA on 11/22/2016 at 4:24 PM EDT Patient returned call. Results and recommendations reviewed. Patient verbalized understanding and agreed with plan.  Labs ordered and mailed to patient. Patient advised to have repeat labs done the week of June 4th.

## 2016-11-25 NOTE — Telephone Encounter (Signed)
Oral Chemotherapy Pharmacist Encounter  Received notification from [employer] that prior authorization for patient's Lisa [name] has been approved Effective dates: 11/20/16-07/21/17  I have alerted [facility] to process patient's prescription.  Oral Oncology Clinic will continue to follow.  Johny Drilling, PharmD, BCPS, BCOP 11/25/2016  12:53 PM Oral Oncology Clinic [phone]

## 2016-11-25 NOTE — Telephone Encounter (Signed)
Oral Chemotherapy Pharmacist Encounter  Per [facility], patient has already picked up [place]. Copayment $100  Johny Drilling, PharmD, BCPS, BCOP 11/25/2016  3:25 PM Oral Oncology Clinic [phone]

## 2016-12-01 MED FILL — PRAVASTATIN NA 40 MG TAB: 40 | 90 days supply | Qty: 90 | Fill #1

## 2016-12-06 ENCOUNTER — Encounter: Payer: Self-pay | Admitting: Hematology and Oncology

## 2016-12-06 ENCOUNTER — Ambulatory Visit (HOSPITAL_BASED_OUTPATIENT_CLINIC_OR_DEPARTMENT_OTHER): Payer: Medicare Other | Admitting: Hematology and Oncology

## 2016-12-06 ENCOUNTER — Other Ambulatory Visit (HOSPITAL_BASED_OUTPATIENT_CLINIC_OR_DEPARTMENT_OTHER): Payer: Medicare Other

## 2016-12-06 DIAGNOSIS — C782 Secondary malignant neoplasm of pleura: Principal | ICD-10-CM

## 2016-12-06 DIAGNOSIS — I5032 Chronic diastolic (congestive) heart failure: Secondary | ICD-10-CM | POA: Diagnosis not present

## 2016-12-06 DIAGNOSIS — C7951 Secondary malignant neoplasm of bone: Secondary | ICD-10-CM | POA: Diagnosis not present

## 2016-12-06 DIAGNOSIS — C50911 Malignant neoplasm of unspecified site of right female breast: Secondary | ICD-10-CM

## 2016-12-06 LAB — CBC WITH DIFFERENTIAL/PLATELET
BASO%: 1.5 % (ref 0.0–2.0)
Basophils Absolute: 0 10*3/uL (ref 0.0–0.1)
EOS%: 3.7 % (ref 0.0–7.0)
Eosinophils Absolute: 0.1 10*3/uL (ref 0.0–0.5)
HCT: 38 % (ref 34.8–46.6)
HGB: 12.7 g/dL (ref 11.6–15.9)
LYMPH#: 0.7 10*3/uL — AB (ref 0.9–3.3)
LYMPH%: 24.2 % (ref 14.0–49.7)
MCH: 30.6 pg (ref 25.1–34.0)
MCHC: 33.5 g/dL (ref 31.5–36.0)
MCV: 91.3 fL (ref 79.5–101.0)
MONO#: 0.3 10*3/uL (ref 0.1–0.9)
MONO%: 8.8 % (ref 0.0–14.0)
NEUT#: 1.8 10*3/uL (ref 1.5–6.5)
NEUT%: 61.8 % (ref 38.4–76.8)
Platelets: 229 10*3/uL (ref 145–400)
RBC: 4.16 10*6/uL (ref 3.70–5.45)
RDW: 18.8 % — ABNORMAL HIGH (ref 11.2–14.5)
WBC: 2.9 10*3/uL — ABNORMAL LOW (ref 3.9–10.3)

## 2016-12-06 MED ORDER — FUROSEMIDE 40 MG PO TABS: 20.0000 mg | ORAL_TABLET | Freq: Every day | ORAL | 0 refills | Status: DC

## 2016-12-06 NOTE — Progress Notes (Signed)
Patient Care Team: [name], MD as PCP - General (Internal Medicine)  DIAGNOSIS:  Encounter Diagnosis  Name Primary?  . 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      08/29/2008 - 08/30/2015 Anti-estrogen oral therapy    Aromasin followed by letrozole      07/2015 Relapse/Recurrence    Progression to bone required aggressive laminectomy February 2017      08/30/2015 - 09/13/2015 Radiation Therapy    Palliative radiation to the spine,  Right ilium ad right hip      01/05/2016 - 04/29/2016 Chemotherapy    Xeloda stopped when she had progression of dsease       05/02/2016 PET scan     innumerable hypermetabolic lesions throughout the axial and appendicular skeleton, increase in size and number of new lesions in skull, right scapular glenoid, throughout sacrum. Findings concerning for liver metastases, numerous pulmonary nodules bilaterally      05/09/2016 -  Chemotherapy    Taxol weekly initially and later changed her day 1 day 8 every 3 weeks (complicated by A. fib with RVR and prolonged respiratory infection) along with Xgeva       09/19/2016 Procedure    Biopsy of lung mass: Metastatic carcinoma consistent with breast origin, HER-2 negative ratio 1.37      10/25/2016 -  Anti-estrogen oral therapy    Palbociclib and Letrozole       CHIEF COMPLIANT: Follow-up on reduced dosage of Palbociclib and letrozole  INTERVAL HISTORY: Lisa [name] is a 76 year old with above-mentioned is metastatic breast cancer who is on Palbociclib and letrozole. She could not tolerate the 125 mg dosage because of neutropenia.  Because of this we did use the dosage to 100 mg. She's been on it for the past 2 weeks and is here for blood count check. She reports to be feeling quite well. Energy levels are excellent. She is not neutropenic anymore.  REVIEW OF SYSTEMS:   Constitutional: Denies fevers, chills or abnormal weight loss Eyes: Denies blurriness of vision Ears, nose, mouth, throat, and face: Denies mucositis or sore throat Respiratory: Denies cough, dyspnea or wheezes Cardiovascular: Denies palpitation, chest discomfort Gastrointestinal:  Denies nausea, heartburn or change in bowel habits Skin: Denies abnormal skin rashes Lymphatics: Denies new lymphadenopathy or easy bruising Neurological:Denies numbness, tingling or new weaknesses Behavioral/Psych: Mood is stable, no new changes  Extremities: No lower extremity edema Breast:  denies any pain or lumps or nodules in either breasts All other systems were reviewed with the patient and are negative.  I have reviewed the past medical history, past surgical history, social history and family history with the patient and they are unchanged from previous note.  ALLERGIES:  is allergic to diphenhydramine hcl; codeine sulfate; metoprolol; oseltamivir phosphate; oxycodone-acetaminophen; prednisone; and sudafed [pseudoephedrine hcl].  MEDICATIONS:  Current Outpatient Prescriptions  Medication Sig Dispense Refill  . acetaminophen (TYLENOL) 500 MG tablet Take 500 mg by mouth every 6 (six) hours as needed for moderate pain.    [name] amiodarone (PACERONE) 200 MG tablet Take 1 tablet (200 mg total) by mouth daily. 90 tablet 3  .  apixaban (ELIQUIS) 5 MG TABS tablet Take 1 tablet (5 mg total) by mouth 2 (two) times daily. 180 tablet 3  . fluticasone (FLONASE) 50 MCG/ACT nasal spray Place 1 spray into both nostrils 2 (two) times daily. (Patient taking differently: Place 1 spray into both nostrils 2 (two) times daily as needed for allergies. ) 16 g 2  . furosemide (LASIX) 40 MG  tablet Take 1 tablet (40 mg total) by mouth daily. (Patient taking differently: Take 20 mg by mouth daily. ) 90 tablet 3  . gabapentin (NEURONTIN) 100 MG capsule Pt to take 1 tab nightly may titrate up 1 pill every 3 days to max of 300 mg nightly 90 capsule 0  . letrozole (FEMARA) 2.5 MG tablet Take 1 tablet (2.5 mg total) by mouth daily. 90 tablet 3  . loratadine (CLARITIN) 10 MG tablet Take 10 mg by mouth daily.     [name] losartan (COZAAR) 100 MG tablet Take 100 mg by mouth daily.    . ondansetron (ZOFRAN) 8 MG tablet Take 1 tablet (8 mg total) by mouth every 8 (eight) hours as needed for nausea or vomiting. 20 tablet 2  . palbociclib (IBRANCE) 100 MG capsule Take 1 capsule (100 mg total) by mouth daily with breakfast. Take whole with food. 21 capsule 3  . potassium chloride (K-DUR) 10 MEQ tablet Take 1 tablet (10 mEq total) by mouth as directed. 90 tablet 3  . pravastatin (PRAVACHOL) 40 MG tablet Take 1 tablet (40 mg total) by mouth every evening. (Patient taking differently: Take 40 mg by mouth every evening. Pt finishing her bottle of simvastatin before switching to pravastatin) 90 tablet 3  . traMADol (ULTRAM) 50 MG tablet Take 50-100 mg by mouth every 8 (eight) hours as needed for moderate pain.    . vitamin B-12 (CYANOCOBALAMIN) 1000 MCG tablet Take 1,000 mcg by mouth 2 (two) times daily.    . vitamin C (ASCORBIC ACID) 500 MG tablet Take 500 mg by mouth daily.      . vitamin E 200 UNIT capsule Take 200 Units by mouth daily.       No current facility-administered medications for this visit.     PHYSICAL EXAMINATION: ECOG PERFORMANCE STATUS: 1 - Symptomatic but completely ambulatory  Vitals:   12/06/16 1214  BP: 134/65  Pulse: 71  Resp: 20  Temp: 98.2 F (36.8 C)   Filed Weights   12/06/16 1214  Weight: 164 lb 9.6 oz (74.7 kg)    GENERAL:alert, no distress and comfortable SKIN: skin color, texture, turgor are normal, no rashes or significant lesions EYES: normal, Conjunctiva are  pink and non-injected, sclera clear OROPHARYNX:no exudate, no erythema and lips, buccal mucosa, and tongue normal  NECK: supple, thyroid normal size, non-tender, without nodularity LYMPH:  no palpable lymphadenopathy in the cervical, axillary or inguinal LUNGS: clear to auscultation and percussion with normal breathing effort HEART: regular rate & rhythm and no murmurs and no lower extremity edema ABDOMEN:abdomen soft, non-tender and normal bowel sounds MUSCULOSKELETAL:no cyanosis of digits and no clubbing  NEURO: alert & oriented x 3 with fluent speech, no focal motor/sensory deficits EXTREMITIES: No lower extremity edema  LABORATORY DATA:  I have reviewed the data as listed   Chemistry      Component Value Date/Time   NA 140 11/15/2016 1404   K 4.0 11/15/2016 1404   CL 106 07/25/2016 1457   CL 108 (H) 01/13/2013 0806   CO2 24 11/15/2016 1404   BUN 21.0 11/15/2016 1404  CREATININE 1.1 11/15/2016 1404   GLU 114 09/12/2015      Component Value Date/Time   CALCIUM 10.0 11/15/2016 1404   ALKPHOS 78 11/15/2016 1404   AST 27 11/15/2016 1404   ALT 21 11/15/2016 1404   BILITOT 0.64 11/15/2016 1404       Lab Results  Component Value Date   WBC 2.9 (L) 12/06/2016   HGB 12.7 12/06/2016   HCT 38.0 12/06/2016   MCV 91.3 12/06/2016   PLT 229 12/06/2016   NEUTROABS 1.8 12/06/2016    ASSESSMENT & PLAN:  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 [facility]) Metastatic breast cancer tolungs and bone: Clinically improving onweekly taxol beginning 05-09-16, several delays including for counts, rapid A fib, respiratory infection etc  Treatment summary: Taxol days 1 and 8 every 3 weeks with Neupogen along with Xgeva; completed 4 cycles from 05/09/2016- 08/29/2016 Taxol toxicities: Neutropenia: Requiring Neupogen with each treatment  Atrial fibrillation: Recurrent with RVR, cardioverted 07/25/2016,  amiodarone, metoprolol and Lasix for CHF (Dr. Sallyanne Kuster) CKD stage III Pathologic compression  fracture T10 status post radiation and laminectomy T6-L1 09/01/2015, radiation to right iliac area 05/2016  Rebiopsy of the lung nodule 09/19/2016: Metastatic breast cancer that is HER-2 negative,  -------------------------------------------------------------------------------------------------------------------------------------------- Current treatment: Letrozole with Palbociclib  started 10/01/2016  Palbociclib toxicities: 1. Neutropenia: Currently Palbociclib dose is 100 mg daily. Return to clinic in 2 weeks to recheck the blood counts Our plan is to finish 2 more cycles of the Palbociclib and then perform scans. Monitoring closely for toxicities  I spent 25 minutes talking to the patient of which more than half was spent in counseling and coordination of care.  No orders of the defined types were placed in this encounter.  The patient has a good understanding of the overall plan. she agrees with it. she will call with any problems that may develop before the next visit here.   Rulon Eisenmenger, MD 12/06/16

## 2016-12-06 NOTE — Assessment & Plan Note (Signed)
Metastatic breast cancer tolungs and bone: Clinically improving onweekly taxol beginning 05-09-16, several delays including for counts, rapid A fib, respiratory infection etc  Treatment summary: Taxol days 1 and 8 every 3 weeks with Neupogen along with Xgeva; completed 4 cycles from 05/09/2016- 08/29/2016 Taxol toxicities: Neutropenia: Requiring Neupogen with each treatment  Atrial fibrillation: Recurrent with RVR, cardioverted 07/25/2016,  amiodarone, metoprolol and Lasix for CHF (Dr. Sallyanne Kuster) CKD stage III Pathologic compression fracture T10 status post radiation and laminectomy T6-L1 09/01/2015, radiation to right iliac area 05/2016  Rebiopsy of the lung nodule 09/19/2016: Metastatic breast cancer that is HER-2 negative,  -------------------------------------------------------------------------------------------------------------------------------------------- Current treatment: Letrozole with Palbociclib  started 10/01/2016  Palbociclib toxicities: 1. Neutropenia: Currently Palbociclib dose is 100 mg daily. Return to clinic in 4 weeks to recheck the blood counts and follow-up with scans.

## 2016-12-09 MED FILL — IBRANCE 100 MG CAPSULE: 100 | 21 days supply | Qty: 21 | Fill #1

## 2016-12-11 ENCOUNTER — Ambulatory Visit (HOSPITAL_COMMUNITY): Payer: Medicare Other | Attending: Cardiovascular Disease

## 2016-12-11 ENCOUNTER — Other Ambulatory Visit: Payer: Self-pay

## 2016-12-11 DIAGNOSIS — E785 Hyperlipidemia, unspecified: Secondary | ICD-10-CM | POA: Insufficient documentation

## 2016-12-11 DIAGNOSIS — I3139 Other pericardial effusion (noninflammatory): Secondary | ICD-10-CM

## 2016-12-11 DIAGNOSIS — I4891 Unspecified atrial fibrillation: Secondary | ICD-10-CM | POA: Insufficient documentation

## 2016-12-11 DIAGNOSIS — I130 Hypertensive heart and chronic kidney disease with heart failure and stage 1 through stage 4 chronic kidney disease, or unspecified chronic kidney disease: Secondary | ICD-10-CM | POA: Insufficient documentation

## 2016-12-11 DIAGNOSIS — C50919 Malignant neoplasm of unspecified site of unspecified female breast: Secondary | ICD-10-CM | POA: Insufficient documentation

## 2016-12-11 DIAGNOSIS — I081 Rheumatic disorders of both mitral and tricuspid valves: Secondary | ICD-10-CM | POA: Diagnosis not present

## 2016-12-11 DIAGNOSIS — I313 Pericardial effusion (noninflammatory): Secondary | ICD-10-CM | POA: Diagnosis not present

## 2016-12-11 DIAGNOSIS — N189 Chronic kidney disease, unspecified: Secondary | ICD-10-CM | POA: Insufficient documentation

## 2016-12-12 ENCOUNTER — Other Ambulatory Visit: Payer: Self-pay | Admitting: Adult Health

## 2016-12-12 DIAGNOSIS — B0229 Other postherpetic nervous system involvement: Secondary | ICD-10-CM

## 2016-12-13 ENCOUNTER — Other Ambulatory Visit: Payer: Self-pay

## 2016-12-13 DIAGNOSIS — B0229 Other postherpetic nervous system involvement: Secondary | ICD-10-CM

## 2016-12-13 MED ORDER — GABAPENTIN 100 MG PO CAPS: ORAL_CAPSULE | ORAL | 0 refills | Status: DC

## 2016-12-13 MED FILL — GABAPENTIN 100 MG CAPSULE: 100 | 30 days supply | Qty: 90 | Fill #0

## 2016-12-17 ENCOUNTER — Telehealth: Payer: Self-pay

## 2016-12-17 MED ORDER — AMIODARONE HCL 200 MG PO TABS: 100.0000 mg | ORAL_TABLET | Freq: Every day | ORAL | 3 refills | Status: DC

## 2016-12-17 NOTE — Telephone Encounter (Signed)
Patient advised of medication adjustment. Medical record updated.

## 2016-12-17 NOTE — Telephone Encounter (Deleted)
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 Let's try reducing the amiodarone to 100 mg daily (one half tab daily). MCr

## 2016-12-17 NOTE — Telephone Encounter (Signed)
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 Let's try reducing the amiodarone to 100 mg daily (one half tab daily). MCr ------  Notes recorded by Diana Eves, CMA on 12/13/2016 at 4:29 PM EDT Called patient with results. Patient verbalized understanding and agreed with plan. Patient reports some complications since starting amiodarone and questions whether the medication is involved: >unstable balance - had to stop physical therapy d/t lumbar pain from being unsteady >all 10 fingertips are numb - difficulty picking up items and turning pages in a book ------  Notes recorded by [name], MD on 12/12/2016 at 1:38 PM EDT Heart pumping function is normal, but there is evidence of excess fluid buildup. She having trouble breathing or any swelling?. If so, she would benefit from some more diuretic (she has any shortness of breath, please increase furosemide 06/25/1937 milligrams tablet once daily). Please remind her to monitor weight daily and avoid sodium rich foods. There is no pericardial fluid. This is great news, since there was concern about pericardial involvement with her malignancy.

## 2016-12-22 NOTE — Assessment & Plan Note (Signed)
Metastatic breast cancer tolungs and bone: Clinically improving onweekly taxol beginning 05-09-16, several delays including for counts, rapid A fib, respiratory infection etc  Treatment summary: Taxol days 1 and 8 every 3 weeks with Neupogen along with Xgeva; completed 4 cycles from 05/09/2016- 08/29/2016 Taxol toxicities: Neutropenia: Requiring Neupogen with each treatment  Atrial fibrillation: Recurrent with RVR, cardioverted 07/25/2016,  amiodarone, metoprolol and Lasix for CHF (Dr. Sallyanne Kuster) CKD stage III Pathologic compression fracture T10 status post radiation and laminectomy T6-L1 09/01/2015, radiation to right iliac area 05/2016  Rebiopsy of the lung nodule 09/19/2016: Metastatic breast cancer that is HER-2 negative,  -------------------------------------------------------------------------------------------------------------------------------------------- Current treatment: Letrozole with Palbociclib  started 10/01/2016  Palbociclib toxicities: 1. Neutropenia: Currently Palbociclib dose is 100 mg daily. Return to clinic in 4 weeks to recheck the blood counts and follow-up.

## 2016-12-23 ENCOUNTER — Ambulatory Visit (HOSPITAL_BASED_OUTPATIENT_CLINIC_OR_DEPARTMENT_OTHER): Payer: Medicare Other

## 2016-12-23 ENCOUNTER — Encounter: Payer: Self-pay | Admitting: Hematology and Oncology

## 2016-12-23 ENCOUNTER — Ambulatory Visit (HOSPITAL_BASED_OUTPATIENT_CLINIC_OR_DEPARTMENT_OTHER): Payer: Medicare Other | Admitting: Hematology and Oncology

## 2016-12-23 ENCOUNTER — Other Ambulatory Visit (HOSPITAL_BASED_OUTPATIENT_CLINIC_OR_DEPARTMENT_OTHER): Payer: Medicare Other

## 2016-12-23 DIAGNOSIS — C50911 Malignant neoplasm of unspecified site of right female breast: Secondary | ICD-10-CM | POA: Diagnosis not present

## 2016-12-23 DIAGNOSIS — C7951 Secondary malignant neoplasm of bone: Secondary | ICD-10-CM

## 2016-12-23 DIAGNOSIS — C782 Secondary malignant neoplasm of pleura: Secondary | ICD-10-CM | POA: Diagnosis not present

## 2016-12-23 DIAGNOSIS — T451X5A Adverse effect of antineoplastic and immunosuppressive drugs, initial encounter: Principal | ICD-10-CM

## 2016-12-23 DIAGNOSIS — R5383 Other fatigue: Secondary | ICD-10-CM | POA: Diagnosis not present

## 2016-12-23 DIAGNOSIS — D701 Agranulocytosis secondary to cancer chemotherapy: Secondary | ICD-10-CM

## 2016-12-23 DIAGNOSIS — C50919 Malignant neoplasm of unspecified site of unspecified female breast: Secondary | ICD-10-CM

## 2016-12-23 LAB — COMPREHENSIVE METABOLIC PANEL
ALBUMIN: 4 g/dL (ref 3.5–5.0)
ALK PHOS: 86 U/L (ref 40–150)
ALT: 23 U/L (ref 0–55)
ANION GAP: 10 meq/L (ref 3–11)
AST: 27 U/L (ref 5–34)
BILIRUBIN TOTAL: 0.68 mg/dL (ref 0.20–1.20)
BUN: 32.1 mg/dL — ABNORMAL HIGH (ref 7.0–26.0)
CALCIUM: 9.4 mg/dL (ref 8.4–10.4)
CO2: 25 meq/L (ref 22–29)
CREATININE: 1.5 mg/dL — AB (ref 0.6–1.1)
Chloride: 106 mEq/L (ref 98–109)
EGFR: 33 mL/min/{1.73_m2} — AB (ref >90)
Glucose: 124 mg/dl (ref 70–140)
Potassium: 3.9 mEq/L (ref 3.5–5.1)
Sodium: 142 mEq/L (ref 136–145)
TOTAL PROTEIN: 7 g/dL (ref 6.4–8.3)

## 2016-12-23 LAB — CBC WITH DIFFERENTIAL/PLATELET
BASO%: 2.6 % — AB (ref 0.0–2.0)
Basophils Absolute: 0.1 10*3/uL (ref 0.0–0.1)
EOS%: 2 % (ref 0.0–7.0)
Eosinophils Absolute: 0 10*3/uL (ref 0.0–0.5)
HEMATOCRIT: 35.8 % (ref 34.8–46.6)
HEMOGLOBIN: 12.1 g/dL (ref 11.6–15.9)
LYMPH%: 32.7 % (ref 14.0–49.7)
MCH: 31.6 pg (ref 25.1–34.0)
MCHC: 34 g/dL (ref 31.5–36.0)
MCV: 92.9 fL (ref 79.5–101.0)
MONO#: 0.2 10*3/uL (ref 0.1–0.9)
MONO%: 11.6 % (ref 0.0–14.0)
NEUT#: 1 10*3/uL — ABNORMAL LOW (ref 1.5–6.5)
NEUT%: 51.1 % (ref 38.4–76.8)
Platelets: 179 10*3/uL (ref 145–400)
RBC: 3.85 10*6/uL (ref 3.70–5.45)
RDW: 18.5 % — AB (ref 11.2–14.5)
WBC: 2 10*3/uL — ABNORMAL LOW (ref 3.9–10.3)
lymph#: 0.6 10*3/uL — ABNORMAL LOW (ref 0.9–3.3)

## 2016-12-23 MED ORDER — DENOSUMAB 120 MG/1.7ML ~~LOC~~ SOLN
120.0000 mg | Freq: Once | SUBCUTANEOUS | Status: AC
  Administered 2016-12-23: 120 mg via SUBCUTANEOUS
  Filled 2016-12-23: qty 1.7

## 2016-12-23 NOTE — Patient Instructions (Signed)

## 2016-12-23 NOTE — Progress Notes (Signed)
Patient Care Team: [name], MD as PCP - General (Internal Medicine)  DIAGNOSIS:  Encounter Diagnosis  Name Primary?  . 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      08/29/2008 - 08/30/2015 Anti-estrogen oral therapy    Aromasin followed by letrozole      07/2015 Relapse/Recurrence    Progression to bone required aggressive laminectomy February 2017      08/30/2015 - 09/13/2015 Radiation Therapy    Palliative radiation to the spine,  Right ilium ad right hip      01/05/2016 - 04/29/2016 Chemotherapy    Xeloda stopped when she had progression of dsease       05/02/2016 PET scan     innumerable hypermetabolic lesions throughout the axial and appendicular skeleton, increase in size and number of new lesions in skull, right scapular glenoid, throughout sacrum. Findings concerning for liver metastases, numerous pulmonary nodules bilaterally      05/09/2016 - 09/18/2016 Chemotherapy    Taxol weekly initially and later changed her day 1 day 8 every 3 weeks (complicated by A. fib with RVR and prolonged respiratory infection) along with Xgeva       09/19/2016 Procedure    Biopsy of lung mass: Metastatic carcinoma consistent with breast origin, HER-2 negative ratio 1.37      10/01/2016 -  Anti-estrogen oral therapy    Palbociclib and Letrozole       CHIEF COMPLIANT: Follow-up on Palbociclib and letrozole  INTERVAL HISTORY: Lisa [name] is a 76 year old with above-mentioned history metastatic breast cancer currently on Palbociclib and letrozole. She appears to be tolerating Palbociclib fairly well except for  fatigue. She denies any nausea vomiting. Denies any fevers or chills.  REVIEW OF SYSTEMS:   Constitutional: Denies fevers, chills or abnormal weight loss Eyes: Denies blurriness of vision Ears, nose, mouth, throat, and face: Denies mucositis or sore throat Respiratory: Denies cough, dyspnea or wheezes Cardiovascular: Denies palpitation, chest discomfort Gastrointestinal:  Denies nausea, heartburn or change in bowel habits Skin: Denies abnormal skin rashes Lymphatics: Denies new lymphadenopathy or easy bruising Neurological:Denies numbness, tingling or new weaknesses Behavioral/Psych: Mood is stable, no new changes  Extremities: No lower extremity edema Breast:  denies any pain or lumps or nodules in either breasts All other systems were reviewed with the patient and are negative.  I have reviewed the past medical history, past surgical history, social history and family history with the patient and they are unchanged from previous note.  ALLERGIES:  is allergic to diphenhydramine hcl; codeine sulfate; metoprolol; oseltamivir phosphate; oxycodone-acetaminophen; prednisone; and sudafed [pseudoephedrine hcl].  MEDICATIONS:  Current Outpatient Prescriptions  Medication Sig Dispense Refill  . acetaminophen (TYLENOL) 500 MG tablet Take 500 mg by mouth every 6 (six) hours as needed for moderate pain.    [name] amiodarone (PACERONE) 200 MG tablet Take 0.5 tablets (100 mg total) by mouth daily. 45 tablet 3  . apixaban (ELIQUIS) 5 MG TABS tablet Take 1 tablet (5 mg total) by mouth 2 (two) times daily. 180 tablet 3  . fluticasone (FLONASE) 50 MCG/ACT nasal spray Place 1 spray into both nostrils 2 (two)  times daily. (Patient taking differently: Place 1 spray into both nostrils 2 (two) times daily as needed for allergies. ) 16 g 2  . furosemide (LASIX) 40 MG tablet Take 0.5 tablets (20 mg total) by mouth daily. 30 tablet 0  . gabapentin (NEURONTIN) 100 MG capsule Pt to take 1 tab nightly may titrate up 1  pill every 3 days to max of 300 mg nightly 90 capsule 0  . letrozole (FEMARA) 2.5 MG tablet Take 1 tablet (2.5 mg total) by mouth daily. 90 tablet 3  . loratadine (CLARITIN) 10 MG tablet Take 10 mg by mouth daily.     [name] losartan (COZAAR) 100 MG tablet Take 100 mg by mouth daily.    . ondansetron (ZOFRAN) 8 MG tablet Take 1 tablet (8 mg total) by mouth every 8 (eight) hours as needed for nausea or vomiting. 20 tablet 2  . palbociclib (IBRANCE) 100 MG capsule Take 1 capsule (100 mg total) by mouth daily with breakfast. Take whole with food. 21 capsule 3  . potassium chloride (K-DUR) 10 MEQ tablet Take 1 tablet (10 mEq total) by mouth as directed. 90 tablet 3  . pravastatin (PRAVACHOL) 40 MG tablet Take 1 tablet (40 mg total) by mouth every evening. (Patient taking differently: Take 40 mg by mouth every evening. Pt finishing her bottle of simvastatin before switching to pravastatin) 90 tablet 3  . traMADol (ULTRAM) 50 MG tablet Take 50-100 mg by mouth every 8 (eight) hours as needed for moderate pain.    . vitamin B-12 (CYANOCOBALAMIN) 1000 MCG tablet Take 1,000 mcg by mouth 2 (two) times daily.    . vitamin C (ASCORBIC ACID) 500 MG tablet Take 500 mg by mouth daily.      . vitamin E 200 UNIT capsule Take 200 Units by mouth daily.       No current facility-administered medications for this visit.     PHYSICAL EXAMINATION: ECOG PERFORMANCE STATUS: 0 - Asymptomatic  Vitals:   12/23/16 1446  BP: 135/60  Pulse: 72  Resp: 19  Temp: 97.7 F (36.5 C)   Filed Weights   12/23/16 1446  Weight: 167 lb 4.8 oz (75.9 kg)    GENERAL:alert, no distress and comfortable SKIN: skin color, texture, turgor are normal, no rashes or significant lesions EYES: normal, Conjunctiva are pink and non-injected, sclera clear OROPHARYNX:no exudate, no erythema and lips, buccal mucosa, and tongue normal  NECK: supple, thyroid normal size, non-tender, without nodularity LYMPH:  no palpable lymphadenopathy in the  cervical, axillary or inguinal LUNGS: clear to auscultation and percussion with normal breathing effort HEART: regular rate & rhythm and no murmurs and no lower extremity edema ABDOMEN:abdomen soft, non-tender and normal bowel sounds MUSCULOSKELETAL:no cyanosis of digits and no clubbing  NEURO: alert & oriented x 3 with fluent speech, no focal motor/sensory deficits EXTREMITIES: No lower extremity edema  LABORATORY DATA:  I have reviewed the data as listed   Chemistry      Component Value Date/Time   NA 140 11/15/2016 1404   K 4.0 11/15/2016 1404   CL 106 07/25/2016 1457   CL 108 (H) 01/13/2013 0806   CO2 24 11/15/2016 1404   BUN 21.0 11/15/2016 1404   CREATININE 1.1 11/15/2016 1404   GLU 114 09/12/2015      Component Value Date/Time   CALCIUM 10.0 11/15/2016 1404   ALKPHOS 78 11/15/2016 1404   AST 27 11/15/2016 1404   ALT 21 11/15/2016 1404   BILITOT 0.64 11/15/2016 1404  Lab Results  Component Value Date   WBC 2.0 (L) 12/23/2016   HGB 12.1 12/23/2016   HCT 35.8 12/23/2016   MCV 92.9 12/23/2016   PLT 179 12/23/2016   NEUTROABS 1.0 (L) 12/23/2016    ASSESSMENT & PLAN:  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 [facility]) Metastatic breast cancer tolungs and bone: Clinically improving onweekly taxol beginning 05-09-16, several delays including for counts, rapid A fib, respiratory infection etc  Treatment summary: Taxol days 1 and 8 every 3 weeks with Neupogen along with Xgeva; completed 4 cycles from 05/09/2016- 08/29/2016 Taxol toxicities: Neutropenia: Requiring Neupogen with each treatment  Atrial fibrillation: Recurrent with RVR, cardioverted 07/25/2016,  amiodarone, metoprolol and Lasix for CHF (Dr. Sallyanne Kuster) CKD stage III Pathologic compression fracture T10 status post radiation and laminectomy T6-L1 09/01/2015, radiation to right iliac area 05/2016  Rebiopsy of the lung nodule 09/19/2016: Metastatic breast cancer that is HER-2 negative,    -------------------------------------------------------------------------------------------------------------------------------------------- Current treatment: Letrozole with Palbociclib  started 10/01/2016  Palbociclib toxicities: 1. Neutropenia: Currently Palbociclib dose is 100 mg daily. Return to clinic in 4 weeks to recheck the blood counts and follow-up.  Bone metastases: She will receive Xgeva today and will receive it every 3 months.  I ordered CT chest abdomen pelvis and follow-up after that.  I spent 25 minutes talking to the patient of which more than half was spent in counseling and coordination of care.  No orders of the defined types were placed in this encounter.  The patient has a good understanding of the overall plan. she agrees with it. she will call with any problems that may develop before the next visit here.   Rulon Eisenmenger, MD 12/23/16

## 2016-12-24 LAB — T4, FREE: Free T4: 1.2 ng/dL (ref 0.8–1.8)

## 2016-12-24 LAB — TSH: TSH: 2.68 mIU/L

## 2016-12-26 MED FILL — LETROZOLE 2.5 MG TABLET: 2.5 | 90 days supply | Qty: 90 | Fill #1

## 2016-12-27 ENCOUNTER — Telehealth: Payer: Self-pay

## 2016-12-27 NOTE — Telephone Encounter (Signed)
Called patient with test results yesterday. She inquired whether she needed to continue to take furosemide at the increased dose (Furosemide 40 mg daily, see echo results).   Discussed with MCr - okay to resume her usual dose, weigh daily, can increase lasix as needed for weight over target weight/shortness of breath/swelling.  Returned call to patient. She verbalized understanding and agreed with plan. Patient reports that she had a nose bleed yesterday. Stated that she put a cold pack on the side of her face that she was having the bleed and the bleeding stopped. She thought it could have been due to the change in the weather, but wanted to report it since she was taking Eliquis.  Advised her that I will send a message to our pharmacists for further recommendations. I did recommend (for the time being) to use a humidifier or use A[place] saline gel to keep her nares moist.

## 2016-12-30 NOTE — Telephone Encounter (Signed)
Talked to patient today. Reports ONLY 1 episode of nose bleed in the past.   No additional recommendations given today. Appropriate to keep nares moist and continue taking Eliquis as prescribed

## 2017-01-02 MED FILL — IBRANCE 100 MG CAPSULE: 100 | 21 days supply | Qty: 21 | Fill #2

## 2017-01-08 ENCOUNTER — Other Ambulatory Visit: Payer: Self-pay | Admitting: Adult Health

## 2017-01-08 DIAGNOSIS — B0229 Other postherpetic nervous system involvement: Secondary | ICD-10-CM

## 2017-01-08 MED FILL — GABAPENTIN 100 MG CAPSULE: 100 | 30 days supply | Qty: 90 | Fill #0

## 2017-01-17 ENCOUNTER — Ambulatory Visit (HOSPITAL_COMMUNITY)
Admission: RE | Admit: 2017-01-17 | Discharge: 2017-01-17 | Disposition: A | Discharge status: Home / Self Care | Payer: Medicare Other | Source: Ambulatory Visit | Attending: Hematology and Oncology | Admitting: Hematology and Oncology

## 2017-01-17 ENCOUNTER — Other Ambulatory Visit: Payer: Self-pay

## 2017-01-17 ENCOUNTER — Encounter (HOSPITAL_COMMUNITY): Payer: Self-pay

## 2017-01-17 DIAGNOSIS — I700 Atherosclerosis of aorta: Secondary | ICD-10-CM | POA: Diagnosis not present

## 2017-01-17 DIAGNOSIS — C782 Secondary malignant neoplasm of pleura: Secondary | ICD-10-CM | POA: Insufficient documentation

## 2017-01-17 DIAGNOSIS — C50911 Malignant neoplasm of unspecified site of right female breast: Secondary | ICD-10-CM

## 2017-01-17 DIAGNOSIS — I2510 Atherosclerotic heart disease of native coronary artery without angina pectoris: Secondary | ICD-10-CM | POA: Insufficient documentation

## 2017-01-17 DIAGNOSIS — Z90710 Acquired absence of both cervix and uterus: Secondary | ICD-10-CM | POA: Insufficient documentation

## 2017-01-17 DIAGNOSIS — C7951 Secondary malignant neoplasm of bone: Secondary | ICD-10-CM | POA: Insufficient documentation

## 2017-01-17 MED ORDER — IOPAMIDOL (ISOVUE-300) INJECTION 61%
INTRAVENOUS | Status: AC
  Filled 2017-01-17: qty 100

## 2017-01-17 MED ORDER — IOPAMIDOL (ISOVUE-300) INJECTION 61%
100.0000 mL | Freq: Once | INTRAVENOUS | Status: AC | PRN
  Administered 2017-01-17: 80 mL via INTRAVENOUS

## 2017-01-20 ENCOUNTER — Other Ambulatory Visit (HOSPITAL_BASED_OUTPATIENT_CLINIC_OR_DEPARTMENT_OTHER): Payer: Medicare Other

## 2017-01-20 ENCOUNTER — Encounter: Payer: Self-pay | Admitting: Hematology and Oncology

## 2017-01-20 ENCOUNTER — Ambulatory Visit (HOSPITAL_BASED_OUTPATIENT_CLINIC_OR_DEPARTMENT_OTHER): Payer: Medicare Other | Admitting: Hematology and Oncology

## 2017-01-20 VITALS — BP 149/70 | HR 78 | Temp 98.2°F | Resp 18 | Ht 62.0 in | Wt 168.5 lb

## 2017-01-20 DIAGNOSIS — C7951 Secondary malignant neoplasm of bone: Secondary | ICD-10-CM

## 2017-01-20 DIAGNOSIS — C782 Secondary malignant neoplasm of pleura: Secondary | ICD-10-CM

## 2017-01-20 DIAGNOSIS — C50911 Malignant neoplasm of unspecified site of right female breast: Secondary | ICD-10-CM

## 2017-01-20 DIAGNOSIS — I5032 Chronic diastolic (congestive) heart failure: Secondary | ICD-10-CM

## 2017-01-20 DIAGNOSIS — B0229 Other postherpetic nervous system involvement: Secondary | ICD-10-CM

## 2017-01-20 LAB — COMPREHENSIVE METABOLIC PANEL
ALT: 18 U/L (ref 0–55)
ANION GAP: 10 meq/L (ref 3–11)
AST: 23 U/L (ref 5–34)
Albumin: 3.8 g/dL (ref 3.5–5.0)
Alkaline Phosphatase: 88 U/L (ref 40–150)
BUN: 22.4 mg/dL (ref 7.0–26.0)
CHLORIDE: 110 meq/L — AB (ref 98–109)
CO2: 23 meq/L (ref 22–29)
Calcium: 9.3 mg/dL (ref 8.4–10.4)
Creatinine: 1.3 mg/dL — ABNORMAL HIGH (ref 0.6–1.1)
EGFR: 41 mL/min/{1.73_m2} — AB (ref >90)
Glucose: 106 mg/dl (ref 70–140)
POTASSIUM: 4.4 meq/L (ref 3.5–5.1)
SODIUM: 142 meq/L (ref 136–145)
Total Bilirubin: 0.8 mg/dL (ref 0.20–1.20)
Total Protein: 7 g/dL (ref 6.4–8.3)

## 2017-01-20 LAB — CBC WITH DIFFERENTIAL/PLATELET
BASO%: 2.3 % — ABNORMAL HIGH (ref 0.0–2.0)
Basophils Absolute: 0 10*3/uL (ref 0.0–0.1)
EOS ABS: 0 10*3/uL (ref 0.0–0.5)
EOS%: 1.1 % (ref 0.0–7.0)
HCT: 34.1 % — ABNORMAL LOW (ref 34.8–46.6)
HGB: 11.7 g/dL (ref 11.6–15.9)
LYMPH%: 35.6 % (ref 14.0–49.7)
MCH: 33.4 pg (ref 25.1–34.0)
MCHC: 34.3 g/dL (ref 31.5–36.0)
MCV: 97.4 fL (ref 79.5–101.0)
MONO#: 0.2 10*3/uL (ref 0.1–0.9)
MONO%: 11.3 % (ref 0.0–14.0)
NEUT#: 0.9 10*3/uL — ABNORMAL LOW (ref 1.5–6.5)
NEUT%: 49.7 % (ref 38.4–76.8)
PLATELETS: 156 10*3/uL (ref 145–400)
RBC: 3.5 10*6/uL — AB (ref 3.70–5.45)
RDW: 17.9 % — ABNORMAL HIGH (ref 11.2–14.5)
WBC: 1.8 10*3/uL — ABNORMAL LOW (ref 3.9–10.3)
lymph#: 0.6 10*3/uL — ABNORMAL LOW (ref 0.9–3.3)

## 2017-01-20 MED ORDER — FUROSEMIDE 40 MG PO TABS: 20.0000 mg | ORAL_TABLET | Freq: Every day | ORAL | 0 refills | Status: DC

## 2017-01-20 MED ORDER — GABAPENTIN 300 MG PO CAPS: 300.0000 mg | ORAL_CAPSULE | Freq: Three times a day (TID) | ORAL | Status: DC

## 2017-01-20 NOTE — Progress Notes (Signed)
Patient Care Team: [name], MD as PCP - General (Internal Medicine)  DIAGNOSIS:  Encounter Diagnoses  Name Primary?  . 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 Bone metastases ([place]) Yes  . Chronic diastolic CHF (congestive heart failure) ([place])   . Post herpetic neuralgia     SUMMARY OF ONCOLOGIC HISTORY:   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 [facility])   1998 Initial Diagnosis    Right breast carcinoma T2N1 ER/PR positive at right mastectomy with node evaluation Dec 1998, treated with adjuvant adriamycin cytoxan followed by 5 years of tamoxifen      2010 Relapse/Recurrence    Metastatic breast cancer to the pleura      08/29/2008 - 08/30/2015 Anti-estrogen oral therapy    Aromasin followed by letrozole      07/2015 Relapse/Recurrence    Progression to bone required aggressive laminectomy February 2017      08/30/2015 - 09/13/2015 Radiation Therapy    Palliative radiation to the spine,  Right ilium ad right hip      01/05/2016 - 04/29/2016 Chemotherapy    Xeloda stopped when she had progression of dsease       05/02/2016 PET scan     innumerable hypermetabolic lesions throughout the axial and appendicular skeleton, increase in size and number of new lesions in skull, right scapular glenoid, throughout sacrum. Findings concerning for liver metastases, numerous pulmonary nodules bilaterally      05/09/2016 - 09/18/2016 Chemotherapy    Taxol weekly initially and later changed her day 1 day 8 every 3 weeks (complicated by A. fib with RVR and prolonged respiratory infection) along with Xgeva       09/19/2016 Procedure    Biopsy of lung mass: Metastatic carcinoma consistent with breast origin, HER-2 negative ratio 1.37      10/01/2016 -  Anti-estrogen oral therapy    Palbociclib and Letrozole       CHIEF COMPLIANT: Follow-up after recent CT scans  INTERVAL HISTORY: Lisa [name] is a 76 year old with above-mentioned history of metastatic breast  cancer with lung and bone metastases who is currently on Palbociclib. She recently underwent CT scans and is here today to discuss the results. The scans show stable disease. She is tolerating Palbociclib extremely well except for low blood counts. It appears that the patient may have mistakenly been taking Palbociclib daily instead of 3 weeks on one week off.  REVIEW OF SYSTEMS:   Constitutional: Denies fevers, chills or abnormal weight loss Eyes: Denies blurriness of vision Ears, nose, mouth, throat, and face: Denies mucositis or sore throat Respiratory: Denies cough, dyspnea or wheezes Cardiovascular: Denies palpitation, chest discomfort Gastrointestinal:  Denies nausea, heartburn or change in bowel habits Skin: Denies abnormal skin rashes Lymphatics: Denies new lymphadenopathy or easy bruising Neurological:Denies numbness, tingling or new weaknesses Behavioral/Psych: Mood is stable, no new changes  Extremities: No lower extremity edema  All other systems were reviewed with the patient and are negative.  I have reviewed the past medical history, past surgical history, social history and family history with the patient and they are unchanged from previous note.  ALLERGIES:  is allergic to diphenhydramine hcl; codeine sulfate; metoprolol; oseltamivir phosphate; oxycodone-acetaminophen; prednisone; and sudafed [pseudoephedrine hcl].  MEDICATIONS:  Current Outpatient Prescriptions  Medication Sig Dispense Refill  . acetaminophen (TYLENOL) 500 MG tablet Take 500 mg by mouth every 6 (six) hours as needed for moderate pain.    [name] amiodarone (PACERONE) 200 MG tablet Take 0.5 tablets (100 mg total)  by mouth daily. 45 tablet 3  . apixaban (ELIQUIS) 5 MG TABS tablet Take 1 tablet (5 mg total) by mouth 2 (two) times daily. 180 tablet 3  . fluticasone (FLONASE) 50 MCG/ACT nasal spray Place 1 spray into both nostrils 2 (two) times daily. (Patient taking differently: Place 1 spray into both nostrils 2  (two) times daily as needed for allergies. ) 16 g 2  . furosemide (LASIX) 40 MG tablet Take 0.5 tablets (20 mg total) by mouth daily. 30 tablet 0  . gabapentin (NEURONTIN) 300 MG capsule Take 1 capsule (300 mg total) by mouth 3 (three) times daily.    [name] letrozole (FEMARA) 2.5 MG tablet Take 1 tablet (2.5 mg total) by mouth daily. 90 tablet 3  . loratadine (CLARITIN) 10 MG tablet Take 10 mg by mouth daily.     [name] losartan (COZAAR) 100 MG tablet Take 100 mg by mouth daily.    . Multiple Vitamins-Minerals (PRESERVISION AREDS 2 PO) Take by mouth 2 (two) times daily.    . palbociclib (IBRANCE) 100 MG capsule Take 1 capsule (100 mg total) by mouth daily with breakfast. Take whole with food. 21 capsule 3  . potassium chloride (K-DUR) 10 MEQ tablet Take 1 tablet (10 mEq total) by mouth as directed. 90 tablet 3  . pravastatin (PRAVACHOL) 40 MG tablet Take 1 tablet (40 mg total) by mouth every evening. (Patient taking differently: Take 40 mg by mouth every evening. Pt finishing her bottle of simvastatin before switching to pravastatin) 90 tablet 3  . vitamin B-12 (CYANOCOBALAMIN) 1000 MCG tablet Take 1,000 mcg by mouth 2 (two) times daily.    . vitamin C (ASCORBIC ACID) 500 MG tablet Take 500 mg by mouth daily.      . vitamin E 200 UNIT capsule Take 200 Units by mouth daily.       No current facility-administered medications for this visit.     PHYSICAL EXAMINATION: ECOG PERFORMANCE STATUS: 1 - Symptomatic but completely ambulatory  Vitals:   01/20/17 1439  BP: (!) 149/70  Pulse: 78  Resp: 18  Temp: 98.2 F (36.8 C)   Filed Weights   01/20/17 1439  Weight: 168 lb 8 oz (76.4 kg)    GENERAL:alert, no distress and comfortable SKIN: skin color, texture, turgor are normal, no rashes or significant lesions EYES: normal, Conjunctiva are pink and non-injected, sclera clear OROPHARYNX:no exudate, no erythema and lips, buccal mucosa, and tongue normal  NECK: supple, thyroid normal size, non-tender,  without nodularity LYMPH:  no palpable lymphadenopathy in the cervical, axillary or inguinal LUNGS: clear to auscultation and percussion with normal breathing effort HEART: regular rate & rhythm and no murmurs and no lower extremity edema ABDOMEN:abdomen soft, non-tender and normal bowel sounds MUSCULOSKELETAL:no cyanosis of digits and no clubbing  NEURO: alert & oriented x 3 with fluent speech, no focal motor/sensory deficits EXTREMITIES: No lower extremity edema  LABORATORY DATA:  I have reviewed the data as listed   Chemistry      Component Value Date/Time   NA 142 01/20/2017 1425   K 4.4 01/20/2017 1425   CL 106 07/25/2016 1457   CL 108 (H) 01/13/2013 0806   CO2 23 01/20/2017 1425   BUN 22.4 01/20/2017 1425   CREATININE 1.3 (H) 01/20/2017 1425   GLU 114 09/12/2015      Component Value Date/Time   CALCIUM 9.3 01/20/2017 1425   ALKPHOS 88 01/20/2017 1425   AST 23 01/20/2017 1425   ALT 18 01/20/2017 1425  BILITOT 0.80 01/20/2017 1425       Lab Results  Component Value Date   WBC 1.8 (L) 01/20/2017   HGB 11.7 01/20/2017   HCT 34.1 (L) 01/20/2017   MCV 97.4 01/20/2017   PLT 156 01/20/2017   NEUTROABS 0.9 (L) 01/20/2017    ASSESSMENT & PLAN:  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 [facility]) Metastatic breast cancer tolungs and bone: Clinically improving onweekly taxol beginning 05-09-16, several delays including for counts, rapid A fib, respiratory infection etc  Treatment summary: Taxol days 1 and 8 every 3 weeks with Neupogen along with Xgeva; completed 4cycles from 05/09/2016-08/29/2016 Taxol toxicities: Neutropenia: Requiring Neupogen with each treatment  Atrial fibrillation: Recurrent with RVR, cardioverted 07/25/2016,  amiodarone, metoprolol and Lasix for CHF (Dr. Sallyanne Kuster) CKD stage III Pathologic compression fracture T10 status post radiation and laminectomy T6-L1 09/01/2015, radiation to right iliac area 05/2016  Rebiopsy of the lung nodule 09/19/2016:  Metastatic breast cancer that is HER-2 negative,  -------------------------------------------------------------------------------------------------------------------------------------------- Current treatment: LetrozolewithPalbociclib  started 10/01/2016  Palbociclib toxicities: 1. Neutropenia: Currently Palbociclib dose is 100 mg daily. Bone metastases: She will receive Xgeva today and will receive it every 3 months.  CT chest abdomen pelvis 01/17/2017: Stable skeletal metastases  Return to clinic in 2 months for follow-up along with Xgeva and after that we can see her every 3 months   I spent 25 minutes talking to the patient of which more than half was spent in counseling and coordination of care.  No orders of the defined types were placed in this encounter.  The patient has a good understanding of the overall plan. she agrees with it. she will call with any problems that may develop before the next visit here.   Rulon Eisenmenger, MD 01/20/17

## 2017-01-20 NOTE — Assessment & Plan Note (Signed)
Metastatic breast cancer tolungs and bone: Clinically improving onweekly taxol beginning 05-09-16, several delays including for counts, rapid A fib, respiratory infection etc  Treatment summary: Taxol days 1 and 8 every 3 weeks with Neupogen along with Xgeva; completed 4cycles from 05/09/2016-08/29/2016 Taxol toxicities: Neutropenia: Requiring Neupogen with each treatment  Atrial fibrillation: Recurrent with RVR, cardioverted 07/25/2016,  amiodarone, metoprolol and Lasix for CHF (Dr. Sallyanne Kuster) CKD stage III Pathologic compression fracture T10 status post radiation and laminectomy T6-L1 09/01/2015, radiation to right iliac area 05/2016  Rebiopsy of the lung nodule 09/19/2016: Metastatic breast cancer that is HER-2 negative,  -------------------------------------------------------------------------------------------------------------------------------------------- Current treatment: LetrozolewithPalbociclib  started 10/01/2016  Palbociclib toxicities: 1. Neutropenia: Currently Palbociclib dose is 100 mg daily. Bone metastases: She will receive Xgeva today and will receive it every 3 months.  CT chest abdomen pelvis 01/17/2017: Stable skeletal metastases  Return to clinic in 2 months for follow-up along with Xgeva and after that we can see her every 3 months

## 2017-01-24 ENCOUNTER — Encounter (HOSPITAL_COMMUNITY): Payer: Self-pay

## 2017-01-24 MED FILL — IBRANCE 100 MG CAPSULE: 100 | 21 days supply | Qty: 21 | Fill #3

## 2017-01-27 MED FILL — LOSARTAN POTASSIUM 50 MG TA: 50 | 90 days supply | Qty: 180 | Fill #1

## 2017-02-08 MED FILL — ELIQUIS 5 MG TABLET: 5 | 90 days supply | Qty: 180 | Fill #2

## 2017-02-12 ENCOUNTER — Telehealth: Payer: Self-pay | Admitting: Pharmacist

## 2017-02-12 NOTE — Telephone Encounter (Signed)
Oral Chemotherapy Pharmacist Encounter  Follow-Up Form  Called patient today to follow up regarding patient's oral chemotherapy medication: Leslee Home  Original Start date of oral chemotherapy: 10/06/16  Pt is doing well today  Pt reports 0 tablets/doses of Ibrance missed in the last month. She reports missing no doses since starting the medication. She uses a pillbox and places the medication near where she eats her breakfast. Missed dose(s) attributed to: N/A  Pt reports the following side effects: she reported no issues related to the Avimor. Other Issues: Patient reported she has had shingles recently which were treated and is still dealing with some nerve pain related to that. She is currently taking Gabapentin but would like to try stopping that medication due to pill burden. I told the patient that if she has a lot of nerve pain when she stops the medication it may be best to restart the medication. She agreed.  Patient knows to call the office with questions or concerns. Oral Oncology Clinic will continue to follow.  Thank you,  Nuala Alpha, PharmD, BCPS 02/12/2017 4:36 PM Oral Oncology Clinic [phone]

## 2017-02-12 NOTE — Telephone Encounter (Signed)
Oral Chemotherapy Pharmacist Encounter   Attempted to reach patient for follow up on oral medication: Ibrance. No answer. Left VM for patient to call back with any questions or issues.   Thank you,  Nuala Alpha, PharmD, BCPS 02/12/2017 2:17 PM Oral Oncology Clinic [phone]

## 2017-02-21 ENCOUNTER — Other Ambulatory Visit: Payer: Self-pay | Admitting: Hematology and Oncology

## 2017-02-21 MED FILL — IBRANCE 100 MG CAPSULE: 100 | 21 days supply | Qty: 21 | Fill #0

## 2017-03-01 MED FILL — PRAVASTATIN NA 40 MG TAB: 40 | 90 days supply | Qty: 90 | Fill #2

## 2017-03-18 MED FILL — IBRANCE 100 MG CAPSULE: 100 | 21 days supply | Qty: 21 | Fill #1

## 2017-03-22 MED FILL — AMIODARONE HCL 200 MG TAB: 200 | 90 days supply | Qty: 90 | Fill #2

## 2017-03-22 MED FILL — LETROZOLE 2.5 MG TABLET: 2.5 | 90 days supply | Qty: 90 | Fill #2

## 2017-03-25 ENCOUNTER — Ambulatory Visit (HOSPITAL_BASED_OUTPATIENT_CLINIC_OR_DEPARTMENT_OTHER): Payer: Medicare Other

## 2017-03-25 ENCOUNTER — Ambulatory Visit (HOSPITAL_BASED_OUTPATIENT_CLINIC_OR_DEPARTMENT_OTHER): Payer: Medicare Other | Admitting: Hematology and Oncology

## 2017-03-25 ENCOUNTER — Telehealth: Payer: Self-pay | Admitting: Hematology and Oncology

## 2017-03-25 ENCOUNTER — Encounter: Payer: Self-pay | Admitting: Hematology and Oncology

## 2017-03-25 ENCOUNTER — Other Ambulatory Visit (HOSPITAL_BASED_OUTPATIENT_CLINIC_OR_DEPARTMENT_OTHER): Payer: Medicare Other

## 2017-03-25 DIAGNOSIS — C50911 Malignant neoplasm of unspecified site of right female breast: Secondary | ICD-10-CM

## 2017-03-25 DIAGNOSIS — C50919 Malignant neoplasm of unspecified site of unspecified female breast: Secondary | ICD-10-CM

## 2017-03-25 DIAGNOSIS — C7951 Secondary malignant neoplasm of bone: Secondary | ICD-10-CM | POA: Diagnosis not present

## 2017-03-25 DIAGNOSIS — D701 Agranulocytosis secondary to cancer chemotherapy: Secondary | ICD-10-CM

## 2017-03-25 DIAGNOSIS — C782 Secondary malignant neoplasm of pleura: Secondary | ICD-10-CM | POA: Diagnosis not present

## 2017-03-25 DIAGNOSIS — I5032 Chronic diastolic (congestive) heart failure: Secondary | ICD-10-CM

## 2017-03-25 DIAGNOSIS — T451X5A Adverse effect of antineoplastic and immunosuppressive drugs, initial encounter: Secondary | ICD-10-CM

## 2017-03-25 LAB — CBC WITH DIFFERENTIAL/PLATELET
BASO%: 2.4 % — AB (ref 0.0–2.0)
Basophils Absolute: 0 10*3/uL (ref 0.0–0.1)
EOS ABS: 0.1 10*3/uL (ref 0.0–0.5)
EOS%: 3 % (ref 0.0–7.0)
HCT: 35.5 % (ref 34.8–46.6)
HGB: 12.2 g/dL (ref 11.6–15.9)
LYMPH%: 34.5 % (ref 14.0–49.7)
MCH: 35 pg — ABNORMAL HIGH (ref 25.1–34.0)
MCHC: 34.4 g/dL (ref 31.5–36.0)
MCV: 101.7 fL — ABNORMAL HIGH (ref 79.5–101.0)
MONO#: 0.2 10*3/uL (ref 0.1–0.9)
MONO%: 9.5 % (ref 0.0–14.0)
NEUT#: 0.9 10*3/uL — ABNORMAL LOW (ref 1.5–6.5)
NEUT%: 50.6 % (ref 38.4–76.8)
PLATELETS: 123 10*3/uL — AB (ref 145–400)
RBC: 3.49 10*6/uL — AB (ref 3.70–5.45)
RDW: 14 % (ref 11.2–14.5)
WBC: 1.7 10*3/uL — ABNORMAL LOW (ref 3.9–10.3)
lymph#: 0.6 10*3/uL — ABNORMAL LOW (ref 0.9–3.3)

## 2017-03-25 LAB — COMPREHENSIVE METABOLIC PANEL
ALK PHOS: 80 U/L (ref 40–150)
ALT: 14 U/L (ref 0–55)
ANION GAP: 8 meq/L (ref 3–11)
AST: 23 U/L (ref 5–34)
Albumin: 3.9 g/dL (ref 3.5–5.0)
BUN: 26.5 mg/dL — AB (ref 7.0–26.0)
CHLORIDE: 108 meq/L (ref 98–109)
CO2: 24 meq/L (ref 22–29)
CREATININE: 1.4 mg/dL — AB (ref 0.6–1.1)
Calcium: 9.7 mg/dL (ref 8.4–10.4)
EGFR: 36 mL/min/{1.73_m2} — AB (ref >90)
Glucose: 116 mg/dl (ref 70–140)
Potassium: 4.2 mEq/L (ref 3.5–5.1)
Sodium: 140 mEq/L (ref 136–145)
Total Bilirubin: 1.01 mg/dL (ref 0.20–1.20)
Total Protein: 7.2 g/dL (ref 6.4–8.3)

## 2017-03-25 MED ORDER — DENOSUMAB 120 MG/1.7ML ~~LOC~~ SOLN
120.0000 mg | Freq: Once | SUBCUTANEOUS | Status: AC
  Administered 2017-03-25: 120 mg via SUBCUTANEOUS
  Filled 2017-03-25: qty 1.7

## 2017-03-25 NOTE — Telephone Encounter (Signed)
Gave patient avs and calendar with upcoming appts.  °

## 2017-03-25 NOTE — Progress Notes (Signed)
Patient Care Team: Lisa Battles, MD as PCP - General (Internal Medicine)  DIAGNOSIS:  Encounter Diagnosis  Name Primary?  . 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 ([name])     SUMMARY OF ONCOLOGIC HISTORY:   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 [name])   1998 Initial Diagnosis    Right breast carcinoma T2N1 ER/PR positive at right mastectomy with node evaluation Dec 1998, treated with adjuvant adriamycin cytoxan followed by 5 years of tamoxifen      2010 Relapse/Recurrence    Metastatic breast cancer to the pleura      08/29/2008 - 08/30/2015 Anti-estrogen oral therapy    Aromasin followed by letrozole      07/2015 Relapse/Recurrence    Progression to bone required aggressive laminectomy February 2017      08/30/2015 - 09/13/2015 Radiation Therapy    Palliative radiation to the spine,  Right ilium ad right hip      01/05/2016 - 04/29/2016 Chemotherapy    Xeloda stopped when she had progression of dsease       05/02/2016 PET scan     innumerable hypermetabolic lesions throughout the axial and appendicular skeleton, increase in size and number of new lesions in skull, right scapular glenoid, throughout sacrum. Findings concerning for liver metastases, numerous pulmonary nodules bilaterally      05/09/2016 - 09/18/2016 Chemotherapy    Taxol weekly initially and later changed her day 1 day 8 every 3 weeks (complicated by A. fib with RVR and prolonged respiratory infection) along with Xgeva       09/19/2016 Procedure    Biopsy of lung mass: Metastatic carcinoma consistent with breast origin, HER-2 negative ratio 1.37      10/01/2016 -  Anti-estrogen oral therapy    Palbociclib and Letrozole       CHIEF COMPLIANT: Follow-up of metastatic breast cancer on Palbociclib and letrozole  INTERVAL HISTORY: Lisa [name] is a 76 year old with above-mentioned is metastatic breast cancer Palbociclib and letrozole. She is tolerating the treatment extremely well. She does not  have any fatigue or nausea vomiting. She has had atrial fibrillation for which she is on amiodarone. She is also anticoagulation. Denies any pain or discomfort.  REVIEW OF SYSTEMS:   Constitutional: Denies fevers, chills or abnormal weight loss Eyes: Denies blurriness of vision Ears, nose, mouth, throat, and face: Denies mucositis or sore throat Respiratory: Denies cough, dyspnea or wheezes Cardiovascular: Denies palpitation, chest discomfort Gastrointestinal:  Denies nausea, heartburn or change in bowel habits Skin: Denies abnormal skin rashes Lymphatics: Denies new lymphadenopathy or easy bruising Neurological:Denies numbness, tingling or new weaknesses Behavioral/Psych: Mood is stable, no new changes  Extremities: No lower extremity edema Breast:  denies any pain or lumps or nodules in either breasts All other systems were reviewed with the patient and are negative.  I have reviewed the past medical history, past surgical history, social history and family history with the patient and they are unchanged from previous note.  ALLERGIES:  is allergic to diphenhydramine hcl; codeine sulfate; metoprolol; oseltamivir phosphate; oxycodone-acetaminophen; prednisone; and sudafed [pseudoephedrine hcl].  MEDICATIONS:  Current Outpatient Prescriptions  Medication Sig Dispense Refill  . acetaminophen (TYLENOL) 500 MG tablet Take 500 mg by mouth every 6 (six) hours as needed for moderate pain.    [name] amiodarone (PACERONE) 200 MG tablet Take 0.5 tablets (100 mg total) by mouth daily. 45 tablet 3  . apixaban (ELIQUIS) 5 MG TABS tablet Take 1 tablet (5 mg total) by mouth 2 (two) times daily. 180 tablet  3  . fluticasone (FLONASE) 50 MCG/ACT nasal spray Place 1 spray into both nostrils 2 (two) times daily. (Patient taking differently: Place 1 spray into both nostrils 2 (two) times daily as needed for allergies. ) 16 g 2  . furosemide (LASIX) 40 MG tablet Take 0.5 tablets (20 mg total) by mouth daily. 30  tablet 0  . gabapentin (NEURONTIN) 300 MG capsule Take 1 capsule (300 mg total) by mouth 3 (three) times daily.    Leslee Home 100 MG capsule TAKE 1 CAPSULE BY MOUTH ONCE DAILY WITH BREAKFAST. TAKE WHOLE WITH FOOD 21 capsule 3  . letrozole (FEMARA) 2.5 MG tablet Take 1 tablet (2.5 mg total) by mouth daily. 90 tablet 3  . loratadine (CLARITIN) 10 MG tablet Take 10 mg by mouth daily.     [name] losartan (COZAAR) 100 MG tablet Take 100 mg by mouth daily.    . Multiple Vitamins-Minerals (PRESERVISION AREDS 2 PO) Take by mouth 2 (two) times daily.    . potassium chloride (K-DUR) 10 MEQ tablet Take 1 tablet (10 mEq total) by mouth as directed. 90 tablet 3  . pravastatin (PRAVACHOL) 40 MG tablet Take 1 tablet (40 mg total) by mouth every evening. (Patient taking differently: Take 40 mg by mouth every evening. Pt finishing her bottle of simvastatin before switching to pravastatin) 90 tablet 3  . vitamin B-12 (CYANOCOBALAMIN) 1000 MCG tablet Take 1,000 mcg by mouth 2 (two) times daily.    . vitamin C (ASCORBIC ACID) 500 MG tablet Take 500 mg by mouth daily.      . vitamin E 200 UNIT capsule Take 200 Units by mouth daily.       No current facility-administered medications for this visit.     PHYSICAL EXAMINATION: ECOG PERFORMANCE STATUS: 1 - Symptomatic but completely ambulatory  Vitals:   03/25/17 1510  BP: (!) 147/78  Pulse: 82  Resp: 18  Temp: 98.5 F (36.9 C)  SpO2: 100%   Filed Weights   03/25/17 1510  Weight: 163 lb 8 oz (74.2 kg)    GENERAL:alert, no distress and comfortable SKIN: skin color, texture, turgor are normal, no rashes or significant lesions EYES: normal, Conjunctiva are pink and non-injected, sclera clear OROPHARYNX:no exudate, no erythema and lips, buccal mucosa, and tongue normal  NECK: supple, thyroid normal size, non-tender, without nodularity LYMPH:  no palpable lymphadenopathy in the cervical, axillary or inguinal LUNGS: clear to auscultation and percussion with normal  breathing effort HEART: Atrial fibrillation ABDOMEN:abdomen soft, non-tender and normal bowel sounds MUSCULOSKELETAL:no cyanosis of digits and no clubbing  NEURO: alert & oriented x 3 with fluent speech, no focal motor/sensory deficits EXTREMITIES: No lower extremity edema  LABORATORY DATA:  I have reviewed the data as listed   Chemistry      Component Value Date/Time   NA 142 01/20/2017 1425   K 4.4 01/20/2017 1425   CL 106 07/25/2016 1457   CL 108 (H) 01/13/2013 0806   CO2 23 01/20/2017 1425   BUN 22.4 01/20/2017 1425   CREATININE 1.3 (H) 01/20/2017 1425   GLU 114 09/12/2015      Component Value Date/Time   CALCIUM 9.3 01/20/2017 1425   ALKPHOS 88 01/20/2017 1425   AST 23 01/20/2017 1425   ALT 18 01/20/2017 1425   BILITOT 0.80 01/20/2017 1425       Lab Results  Component Value Date   WBC 1.7 (L) 03/25/2017   HGB 12.2 03/25/2017   HCT 35.5 03/25/2017   MCV 101.7 (H)  03/25/2017   PLT 123 (L) 03/25/2017   NEUTROABS 0.9 (L) 03/25/2017    ASSESSMENT & PLAN:  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 [facility]) Metastatic breast cancer tolungs and bone: Clinically improving onweekly taxol beginning 05-09-16, several delays including for counts, rapid A fib, respiratory infection etc  Treatment summary: Taxol days 1 and 8 every 3 weeks with Neupogen along with Xgeva; completed 4cycles from 05/09/2016-08/29/2016 Taxol toxicities: Neutropenia: Requiring Neupogen with each treatment  Atrial fibrillation: Recurrent with RVR, cardioverted 07/25/2016, amiodarone, metoprolol and Lasix for CHF (Dr. Sallyanne Kuster) CKD stage III Pathologic compression fracture T10 status post radiation and laminectomy T6-L1 09/01/2015, radiation to right iliac area 05/2016  Rebiopsy of the lung nodule 09/19/2016: Metastatic breast cancer that is HER-2 negative,   -------------------------------------------------------------------------------------------------------------------------------------------- Current treatment: LetrozolewithPalbociclib started 10/01/2016  Palbociclib toxicities: 1. Neutropenia: Currently Palbociclib dose is 100 mg daily.  Bone metastases: She will receive Xgeva today and will receive it every 3 months. I reviewed the patient's blood work. ANC 900, platelet count 123  CT chest abdomen pelvis 01/17/2017: Stable skeletal metastases  Return to clinic every 3 months  with Delton See and follow up she will have CT chest abdomen pelvis prior to the next visit.  Scans every 6 months   I spent 25 minutes talking to the patient of which more than half was spent in counseling and coordination of care.  No orders of the defined types were placed in this encounter.  The patient has a good understanding of the overall plan. she agrees with it. she will call with any problems that may develop before the next visit here.   Rulon Eisenmenger, MD 03/25/17

## 2017-03-25 NOTE — Assessment & Plan Note (Signed)
Metastatic breast cancer tolungs and bone: Clinically improving onweekly taxol beginning 05-09-16, several delays including for counts, rapid A fib, respiratory infection etc  Treatment summary: Taxol days 1 and 8 every 3 weeks with Neupogen along with Xgeva; completed 4cycles from 05/09/2016-08/29/2016 Taxol toxicities: Neutropenia: Requiring Neupogen with each treatment  Atrial fibrillation: Recurrent with RVR, cardioverted 07/25/2016, amiodarone, metoprolol and Lasix for CHF (Dr. Sallyanne Kuster) CKD stage III Pathologic compression fracture T10 status post radiation and laminectomy T6-L1 09/01/2015, radiation to right iliac area 05/2016  Rebiopsy of the lung nodule 09/19/2016: Metastatic breast cancer that is HER-2 negative,  -------------------------------------------------------------------------------------------------------------------------------------------- Current treatment: LetrozolewithPalbociclib started 10/01/2016  Palbociclib toxicities: 1. Neutropenia: Currently Palbociclib dose is 100 mg daily. Bone metastases: She will receive Xgeva today and will receive it every 3 months.  CT chest abdomen pelvis 01/17/2017: Stable skeletal metastases  Return to clinic every 3 months with Xgeva and follow Scans every 6 months

## 2017-04-21 MED FILL — FUROSEMIDE 40 MG TABS: 40 | 90 days supply | Qty: 90 | Fill #1

## 2017-04-21 MED FILL — IBRANCE 100 MG CAPSULE: 100 | 21 days supply | Qty: 21 | Fill #2

## 2017-04-28 MED FILL — LOSARTAN POTASSIUM 50 MG TA: 50 | 90 days supply | Qty: 180 | Fill #2

## 2017-05-04 MED FILL — ELIQUIS 5 MG TABLET: 5 | 90 days supply | Qty: 180 | Fill #3

## 2017-05-05 ENCOUNTER — Other Ambulatory Visit: Payer: Self-pay | Admitting: Pharmacist

## 2017-05-21 MED FILL — IBRANCE 100 MG CAPSULE: 100 | 21 days supply | Qty: 21 | Fill #3

## 2017-05-30 LAB — COMPREHENSIVE METABOLIC PANEL
A/G RATIO: 2.2 (ref 1.2–2.2)
ALK PHOS: 75 IU/L (ref 39–117)
ALT: 12 IU/L (ref 0–32)
AST: 22 IU/L (ref 0–40)
Albumin: 4.3 g/dL (ref 3.5–4.8)
BILIRUBIN TOTAL: 0.6 mg/dL (ref 0.0–1.2)
BUN/Creatinine Ratio: 21 (ref 12–28)
BUN: 22 mg/dL (ref 8–27)
CHLORIDE: 103 mmol/L (ref 96–106)
CO2: 21 mmol/L (ref 20–29)
Calcium: 9.1 mg/dL (ref 8.7–10.3)
Creatinine, Ser: 1.05 mg/dL — ABNORMAL HIGH (ref 0.57–1.00)
GFR calc Af Amer: 61 mL/min/{1.73_m2} (ref >59)
GFR calc non Af Amer: 53 mL/min/{1.73_m2} — ABNORMAL LOW (ref >59)
Globulin, Total: 2 g/dL (ref 1.5–4.5)
Glucose: 125 mg/dL — ABNORMAL HIGH (ref 65–99)
POTASSIUM: 4.2 mmol/L (ref 3.5–5.2)
Sodium: 140 mmol/L (ref 134–144)
Total Protein: 6.3 g/dL (ref 6.0–8.5)

## 2017-05-30 LAB — TSH: TSH: 4.08 u[IU]/mL (ref 0.450–4.500)

## 2017-06-01 MED FILL — PRAVASTATIN NA 40 MG TAB: 40 | 90 days supply | Qty: 90 | Fill #3

## 2017-06-03 ENCOUNTER — Ambulatory Visit: Payer: Medicare Other | Admitting: Cardiovascular Disease

## 2017-06-03 ENCOUNTER — Encounter: Payer: Self-pay | Admitting: Cardiovascular Disease

## 2017-06-03 VITALS — BP 137/74 | HR 68 | Ht 62.0 in | Wt 164.0 lb

## 2017-06-03 DIAGNOSIS — E782 Mixed hyperlipidemia: Secondary | ICD-10-CM

## 2017-06-03 DIAGNOSIS — I5032 Chronic diastolic (congestive) heart failure: Secondary | ICD-10-CM

## 2017-06-03 DIAGNOSIS — Z79899 Other long term (current) drug therapy: Secondary | ICD-10-CM

## 2017-06-03 DIAGNOSIS — I10 Essential (primary) hypertension: Secondary | ICD-10-CM

## 2017-06-03 DIAGNOSIS — I313 Pericardial effusion (noninflammatory): Secondary | ICD-10-CM

## 2017-06-03 DIAGNOSIS — Z5181 Encounter for therapeutic drug level monitoring: Secondary | ICD-10-CM

## 2017-06-03 DIAGNOSIS — Z7901 Long term (current) use of anticoagulants: Secondary | ICD-10-CM | POA: Diagnosis not present

## 2017-06-03 DIAGNOSIS — I3139 Other pericardial effusion (noninflammatory): Secondary | ICD-10-CM

## 2017-06-03 DIAGNOSIS — I480 Paroxysmal atrial fibrillation: Secondary | ICD-10-CM

## 2017-06-03 MED ORDER — FUROSEMIDE 20 MG PO TABS: 20.0000 mg | ORAL_TABLET | ORAL | 3 refills | Status: DC

## 2017-06-03 NOTE — Progress Notes (Signed)
Dictation #1 [id number]  [id number]    Cardiology Office Note    Date:  06/04/2017   ID:  Lisa [name], DOB 15-Jun-1941, MRN [id number]  PCP:  [name], MD  Cardiologist:   [name], MD   No chief complaint on file.   History of Present Illness:  Lisa [name] is a 76 y.o. female with paroxysmal atrial fibrillation that led to decompensated heart failure in late 2017, cardioversion 05/27/2016, with recurrence and a successful cardioversion in ED 07/25/2016.  She has been on a very low-dose of amiodarone (100 mg daily) ever since, without clinical recurrence.  She is on anticoagulation with Eliquis without bleeding complications.  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  She has done very well since her last appointment.  She has not had palpitations and has not had any problems with shortness of breath at rest or with activity.  She continues to take a diuretic on a daily basis but has not had any problems with edema, orthopnea, PND.  He has not had syncope or dizziness.  She denies bleeding complications.  She is on combination letrozole and Ibrance for widely spread metastatic breast cancer, with good results.  Before that she was treated with Taxol and Xgeva.  She complains of numbness and tingling in her fingertips and her toes.  Hard to say whether this is a side effect of amiodarone versus Taxol (I believe the latter is more likely).  She has normal left ventricular systolic function and mild biatrial dilation and mild to moderate pulmonary artery hypertension by echo. She has evidence of coronary artery calcification, but has not undergone angiography or nuclear stress testing.  She has never had angina pectoris.  She is not receiving aspirin due to full anticoagulation with Eliquis.  She  takes pravastatin for hyperlipidemia.  Past Medical History:  Diagnosis Date  . Breast cancer ([place]) 07-13-1997   right  . Chronic back pain   . CKD (chronic kidney disease), stage III ([place])   . Complication of anesthesia   . Coronary artery calcification of native artery    a. seen on PET scan 04/2016.  [name] Hypertension   . Pericardial effusion    a. small by TEE 05/2016.  [name] Persistent atrial fibrillation ([place])   . Pleural effusion 11-08-2008  . PONV (postoperative nausea and vomiting)   . Radiation 08/17/2015-08/29/2015   lower thoracic spine 22.5 gray  . Radiation 01/17/16-01/24/16   right femur 20Gy    Past Surgical History:  Procedure Laterality Date  . ABDOMINAL HYSTERECTOMY  02-21-1982  . BREAST BIOPSY  07-13-1997  . DILATION AND CURETTAGE, DIAGNOSTIC / THERAPEUTIC  12-13-1981  . MASTECTOMY  07-29-1997  . PELVIC LAPAROSCOPY  11-06-1981  . THORACENTESIS  10-18-2008    Current Medications: Outpatient Medications Prior to Visit  Medication Sig Dispense Refill  . acetaminophen (TYLENOL) 500 MG tablet Take 500 mg by mouth every 6 (six) hours as needed for moderate pain.    [name] apixaban (ELIQUIS) 5 MG TABS tablet Take 1 tablet (5 mg total) by mouth 2 (two) times daily. 180 tablet 3  . fluticasone (FLONASE) 50 MCG/ACT nasal spray Place 1 spray 2 (two) times daily as needed into both nostrils for allergies or rhinitis.    Leslee Home 100 MG capsule TAKE 1 CAPSULE BY MOUTH ONCE DAILY WITH BREAKFAST. TAKE WHOLE WITH FOOD 21  capsule 3  . letrozole (FEMARA) 2.5 MG tablet Take 1 tablet (2.5 mg total) by mouth daily. 90 tablet 3  . loratadine (CLARITIN) 10 MG tablet Take 10 mg by mouth daily.     [name] losartan (COZAAR) 50 MG tablet Take 100 mg daily by mouth.  2  . Potassium 99 MG TABS Take 2 tablets 3 (three) times a week by mouth. Mondays, Wednesdays, and Fridays    . pravastatin (PRAVACHOL) 40 MG tablet Take 40 mg daily by mouth.    . vitamin B-12 (CYANOCOBALAMIN) 1000 MCG tablet Take 1,000 mcg by  mouth 2 (two) times daily.    . vitamin C (ASCORBIC ACID) 500 MG tablet Take 500 mg by mouth daily.      . vitamin E 200 UNIT capsule Take 200 Units by mouth daily.      [name] amiodarone (PACERONE) 200 MG tablet Take 0.5 tablets (100 mg total) by mouth daily. 45 tablet 3  . furosemide (LASIX) 40 MG tablet Take 0.5 tablets (20 mg total) by mouth daily. 30 tablet 0  . fluticasone (FLONASE) 50 MCG/ACT nasal spray Place 1 spray into both nostrils 2 (two) times daily. (Patient not taking: Reported on 06/03/2017) 16 g 2  . losartan (COZAAR) 100 MG tablet Take 100 mg by mouth daily.    . potassium chloride (K-DUR) 10 MEQ tablet Take 1 tablet (10 mEq total) by mouth as directed. 90 tablet 3  . pravastatin (PRAVACHOL) 40 MG tablet Take 1 tablet (40 mg total) by mouth every evening. (Patient taking differently: Take 40 mg by mouth every evening. Pt finishing her bottle of simvastatin before switching to pravastatin) 90 tablet 3   No facility-administered medications prior to visit.      Allergies:   Diphenhydramine hcl; Codeine sulfate; Metoprolol; Oseltamivir phosphate; Oxycodone-acetaminophen; Prednisone; and Sudafed [pseudoephedrine hcl]   Social History   Socioeconomic History  . Marital status: Single    Spouse name: None  . Number of children: 0  . Years of education: None  . Highest education level: None  Social Needs  . Financial resource strain: None  . Food insecurity - worry: None  . Food insecurity - inability: None  . Transportation needs - medical: None  . Transportation needs - non-medical: None  Occupational History  . Occupation: Firefighter for [employer]  . Smoking status: Former Smoker    Packs/day: 0.50    Years: 30.00    Pack years: 15.00    Types: Cigarettes    Last attempt to quit: 07/22/1996    Years since quitting: 20.8  . Smokeless tobacco: Never Used  . Tobacco comment: 1/2 up to 1 ppd  Substance and Sexual Activity  . Alcohol use:  No    Alcohol/week: 0.0 oz    Comment: hx of alcohol socially - hasn't had drink since March 1998  . Drug use: No  . Sexual activity: None  Other Topics Concern  . None  Social History Narrative  . None     Family History:  The patient's family history includes Aortic stenosis in her maternal uncle; Asthma in her father; Breast cancer in her cousin; Breast cancer (age of onset: 23) in her cousin; Diabetes in her paternal grandmother; Heart attack (age of onset: 81) in her maternal grandfather; Heart disease in her mother; Prostate cancer in her maternal uncle, maternal uncle, and maternal uncle; Prostate cancer (age of onset: 90) in her maternal uncle.   ROS:   Please see  the history of present illness.    ROS All other systems reviewed and are negative.   PHYSICAL EXAM:   VS:  BP 137/74   Pulse 68   Ht 5\' 2"  (1.575 m)   Wt 164 lb (74.4 kg)   BMI 30.00 kg/m     General: Alert, oriented x3, no distress, borderline obese Head: no evidence of trauma, PERRL, EOMI, no exophtalmos or lid lag, no myxedema, no xanthelasma; normal ears, nose and oropharynx Neck: normal jugular venous pulsations and no hepatojugular reflux; brisk carotid pulses without delay and no carotid bruits Chest: clear to auscultation, no signs of consolidation by percussion or palpation, normal fremitus, symmetrical and full respiratory excursions Cardiovascular: normal position and quality of the apical impulse, regular rhythm, normal first and second heart sounds, no murmurs, rubs or gallops Abdomen: no tenderness or distention, no masses by palpation, no abnormal pulsatility or arterial bruits, normal bowel sounds, no hepatosplenomegaly Extremities: no clubbing, cyanosis or edema; 2+ radial, ulnar and brachial pulses bilaterally; 2+ right femoral, posterior tibial and dorsalis pedis pulses; 2+ left femoral, posterior tibial and dorsalis pedis pulses; no subclavian or femoral bruits Neurological: grossly  nonfocal Psych: Normal mood and affect   Wt Readings from Last 3 Encounters:  06/03/17 164 lb (74.4 kg)  03/25/17 163 lb 8 oz (74.2 kg)  01/20/17 168 lb 8 oz (76.4 kg)      Studies/Labs Reviewed:   EKG:  EKG is ordered today.  It shows normal sinus rhythm, normal tracing.  Recent Labs: 07/25/2016: B Natriuretic Peptide 1,119.9 03/25/2017: HGB 12.2; Platelets 123 05/29/2017: ALT 12; BUN 22; Creatinine, Ser 1.05; Potassium 4.2; Sodium 140; TSH 4.080   Lipid Panel    Component Value Date/Time   CHOL 134 05/23/2016 0304   CHOL 133 11/09/2015 1104   TRIG 247 (H) 05/23/2016 0304   TRIG 238 (H) 11/09/2015 1104   HDL 31 (L) 05/23/2016 0304   HDL 43 11/09/2015 1104   CHOLHDL 4.3 05/23/2016 0304   VLDL 49 (H) 05/23/2016 0304   LDLCALC 54 05/23/2016 0304   LDLCALC 42 11/09/2015 1104   LDLDIRECT 75.4 06/27/2008 0931    Additional studies/ records that were reviewed today include:  Records from recent visits in Oncology clinic   ASSESSMENT:    1. Paroxysmal atrial fibrillation (HCC)   2. Chronic diastolic CHF (congestive heart failure) ([place])   3. Pericardial effusion - small by TEE 05/27/16   4. Essential hypertension   5. Mixed hyperlipidemia   6. Encounter for monitoring amiodarone therapy   7. Chronic anticoagulation      PLAN:  In order of problems listed above:  1. CHF: She continues to appear euvolemic although she is taking a very low dose of loop diuretic.  We will try to see if we can wean her off this altogether.  To begin with she will take the furosemide and potassium supplements only 3 days a week, Monday Wednesday and Friday.  She will call if she develops weight gain, exertional dyspnea or worsening edema. 2. AFib: She has maintained sinus rhythm for almost a year now despite being on a tiny dose of amiodarone.  She is interested in stopping this medication altogether to see if some of her side effects resolved.  She understands the need to continue uninterrupted  anticoagulation.. CHADSVasc 3 (age, gender, HTN).  Discussed the fact that if she has A. fib recurrence she would benefit from early cardioversion.  She was always very difficult to rate control while  in atrial fibrillation. 3. Pericardial effusion: Small pericardial effusion when she initially presented with atrial fibrillation.  Follow-up echo in May 2018 showed resolution of confusion.  This makes it very unlikely that it was neoplastic.  However, she does have documented pleural involvement with breast cancer.  Currently no clinical signs to suggest pericardial effusion or tamponade. 4. HTN: Good control 5. HLP: We had planned to recheck this after we switch her to pravastatin, but it has not been done yet.  She also has mild hypertriglyceridemia.  Check lipids with her next blood draw. 6. Amiodarone: No sign of liver or thyroid abnormalities on amiodarone, we will be discontinuing this medication at least for the time being.  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  I think it is more likely related to treatment with Taxol. 7. Eliquis: Occasional minor bleeding problems, no serious events.  Continue full anticoagulation    Medication Adjustments/Labs and Tests Ordered: Current medicines are reviewed at length with the patient today.  Concerns regarding medicines are outlined above.  Medication changes, Labs and Tests ordered today are listed in the Patient Instructions below. Patient Instructions  Dr Sallyanne Kuster has recommended making the following medication changes: 1. STOP Amiodarone 2. TAKE Furosemide and Potassium 3 days weekly - Mondays, Wednesdays, and Fridays  Your physician recommends that you schedule a follow-up appointment in 6 months. You will receive a reminder letter in the mail two months in advance. If you don't receive a letter, please call our office to schedule the follow-up appointment.  If you need a refill on your cardiac medications before your next appointment,  please call your pharmacy.    Signed, [name], MD  06/04/2017 2:56 PM    [address], [place], [place]  [address] Phone: [phone]; Fax: [phone]

## 2017-06-03 NOTE — Patient Instructions (Signed)
Dr Sallyanne Kuster has recommended making the following medication changes: 1. STOP Amiodarone 2. TAKE Furosemide and Potassium 3 days weekly - Mondays, Wednesdays, and Fridays  Your physician recommends that you schedule a follow-up appointment in 6 months. You will receive a reminder letter in the mail two months in advance. If you don't receive a letter, please call our office to schedule the follow-up appointment.  If you need a refill on your cardiac medications before your next appointment, please call your pharmacy.

## 2017-06-04 ENCOUNTER — Encounter: Payer: Self-pay | Admitting: Cardiovascular Disease

## 2017-06-04 DIAGNOSIS — Z79899 Other long term (current) drug therapy: Secondary | ICD-10-CM

## 2017-06-04 DIAGNOSIS — Z5181 Encounter for therapeutic drug level monitoring: Secondary | ICD-10-CM | POA: Insufficient documentation

## 2017-06-11 ENCOUNTER — Other Ambulatory Visit: Payer: Self-pay | Admitting: Hematology and Oncology

## 2017-06-13 MED FILL — IBRANCE 100 MG CAPSULE: 100 | 21 days supply | Qty: 21 | Fill #0

## 2017-06-16 IMAGING — CR DG CHEST 2V
2 series · 2 of 2 positions shown · non-contrast
Comparison: 04/27/2015

CLINICAL DATA: Weakness and low back pain.  Fell last night.

EXAM:
CHEST  2 VIEW

[w chest lat]
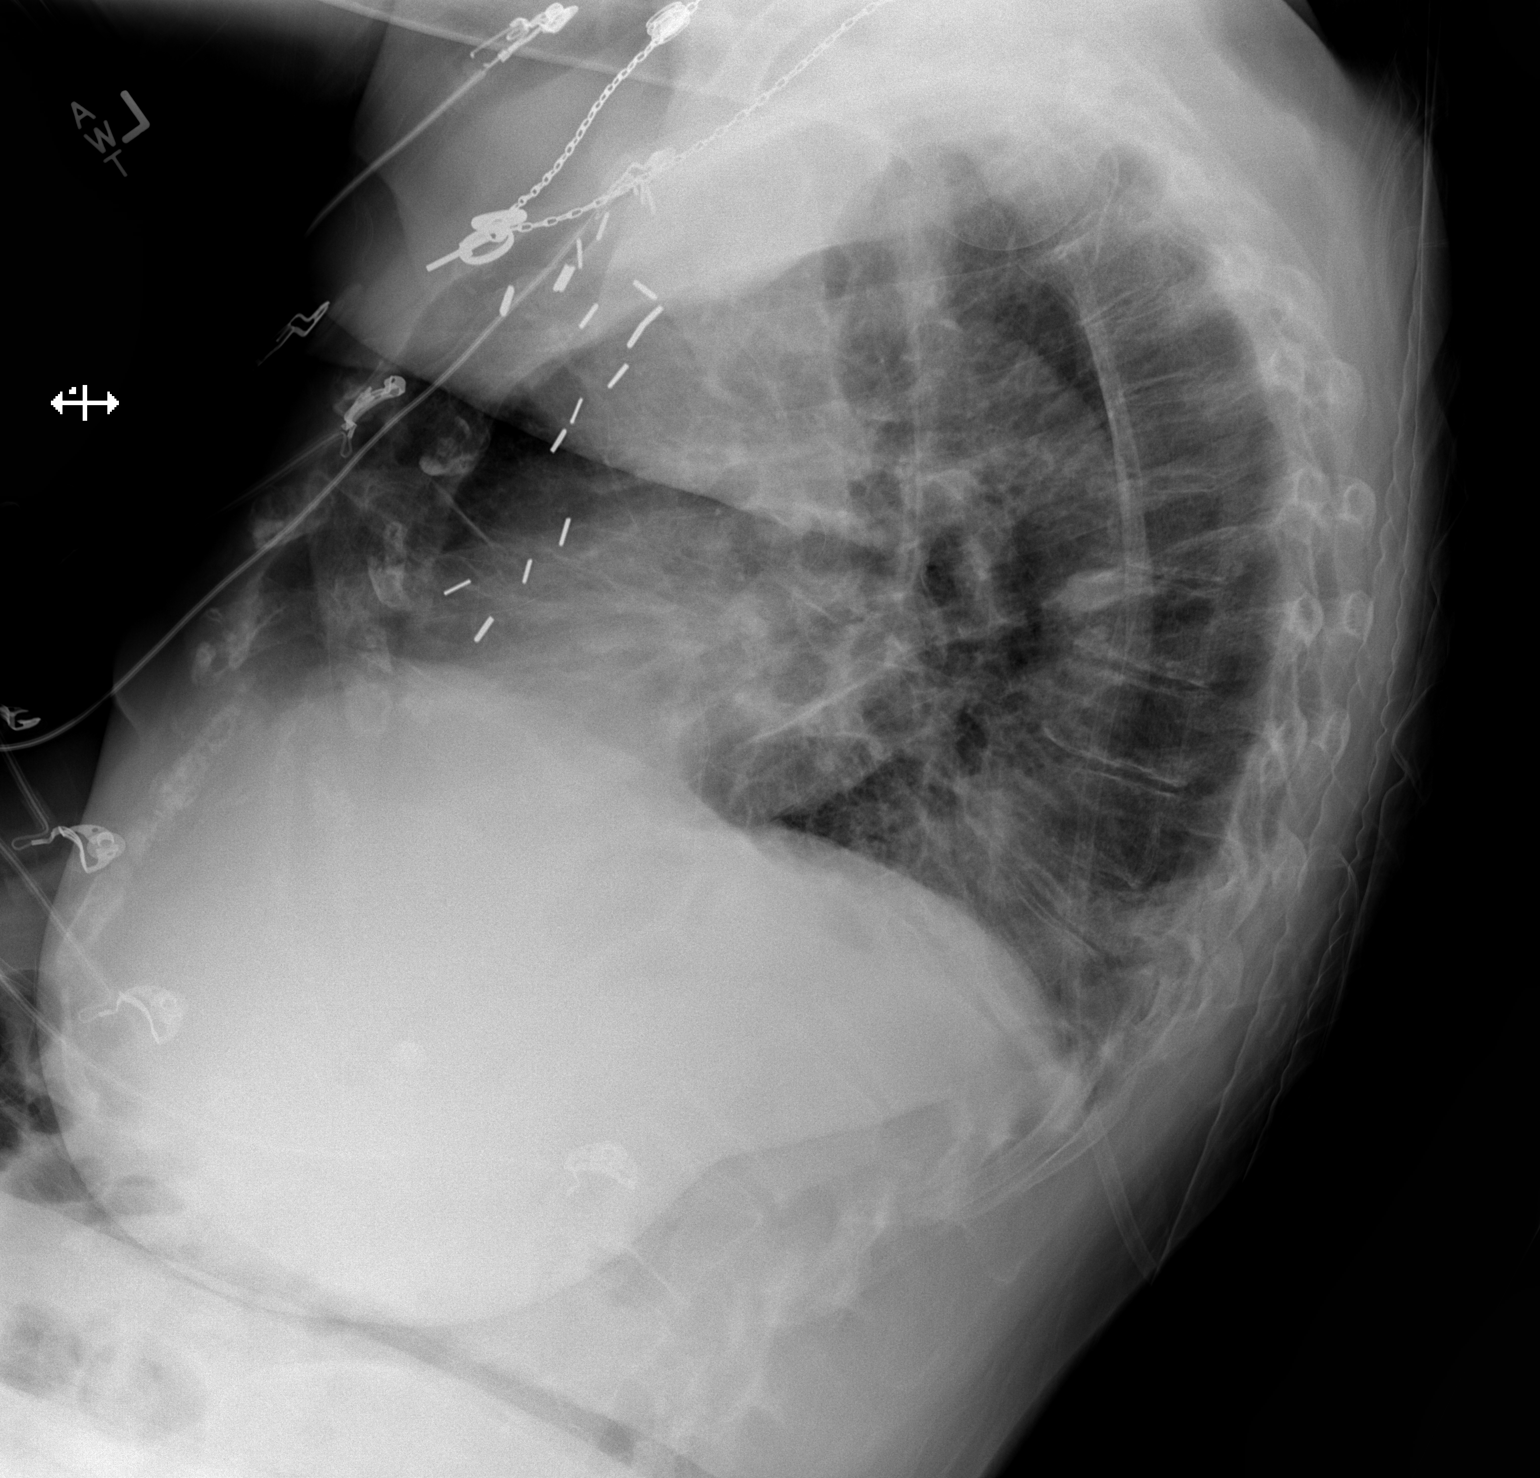

[x chest ap]
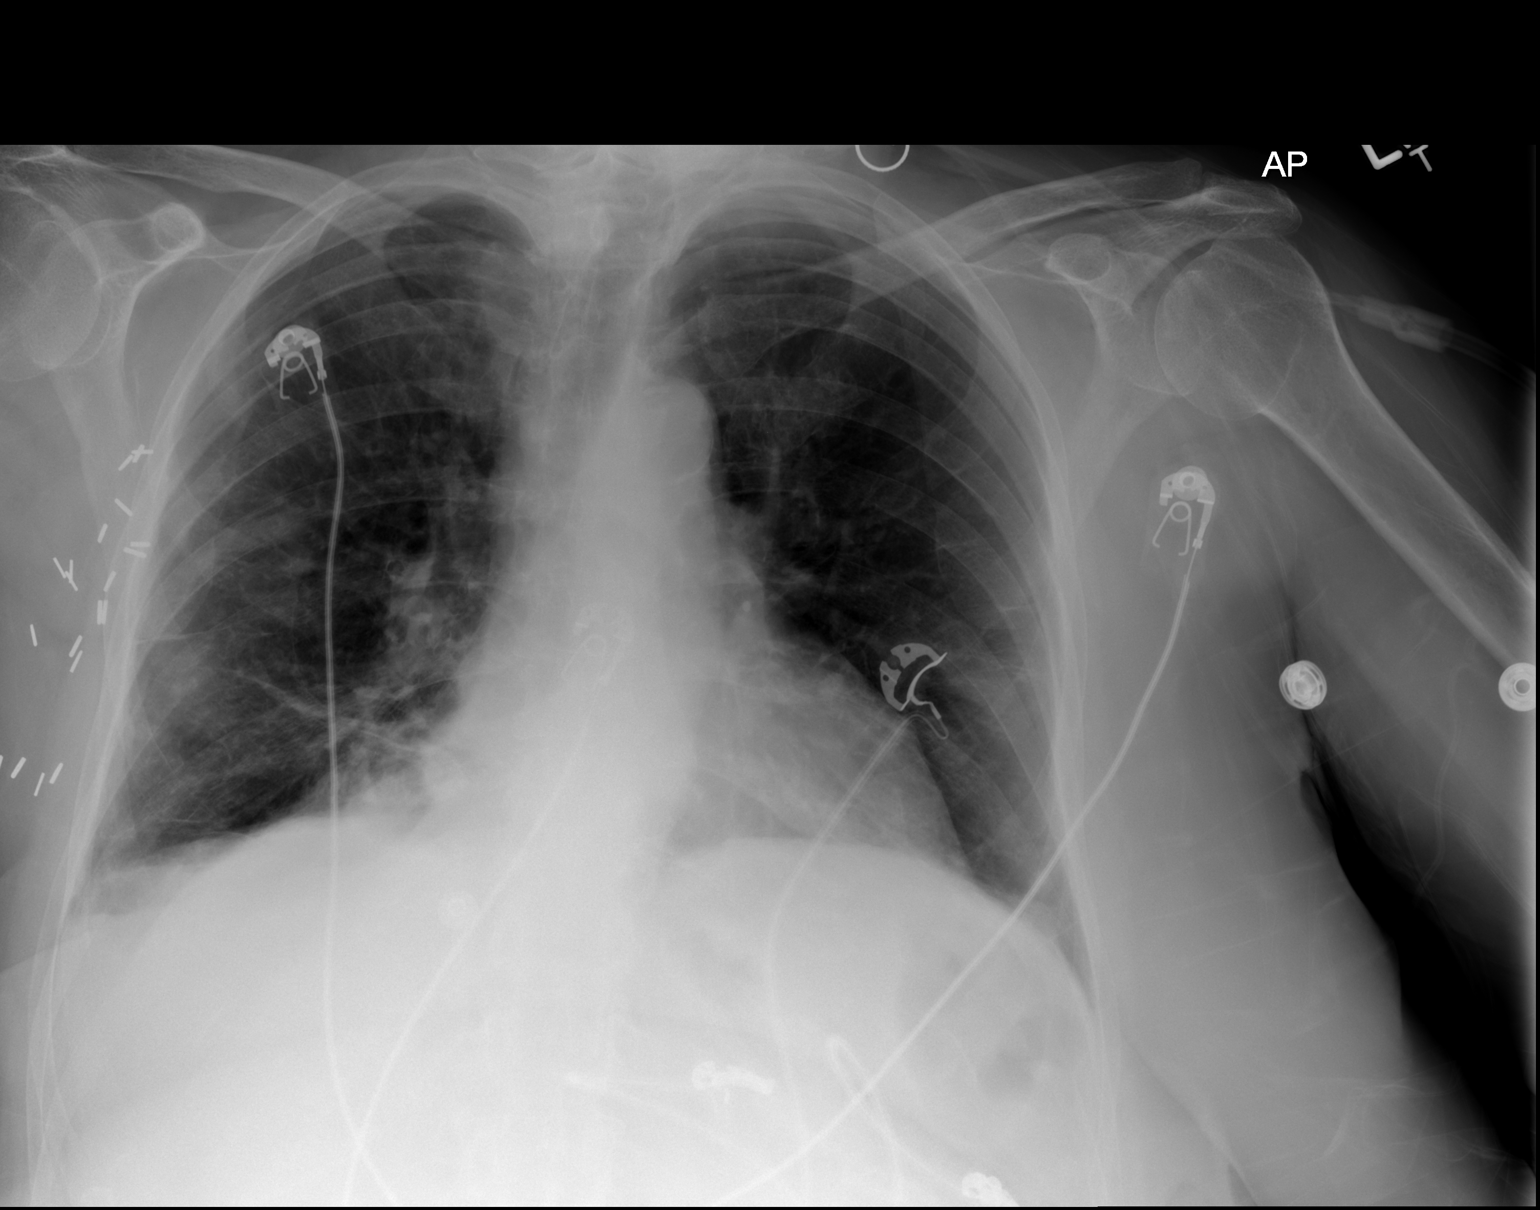

[2 of 2 positions shown; findings below may reference images not displayed]

FINDINGS: Heart size is normal. There is atherosclerosis of the aorta. There
is been previous surgery in the right chest wall and axillary
region. There is chronic pulmonary scarring but no sign of active
infiltrate, mass, or infiltrate. I think there is mild basilar
atelectasis, particularly at the right base. Old lower thoracic
compression fractures again noted. This may have progressed
slightly.
IMPRESSION: Chronic pulmonary scarring. Mild atelectasis at the lung bases,
right more than left.

Old lower thoracic compression fracture which appears to have
progressed slightly.

## 2017-06-16 IMAGING — CT CT ABD-PELV W/O CM
2 of 4 series · 16 of 46 positions shown, 18 images · non-contrast
Comparison: CT scan of June 29, 2014.

CLINICAL DATA: Diarrhea, hypotension.

EXAM:
CT ABDOMEN AND PELVIS WITHOUT CONTRAST
TECHNIQUE: Multidetector CT imaging of the abdomen and pelvis was performed
following the standard protocol without IV contrast.

[Series 2: abd/pel w/o · axial · non-contrast · 0.72mm/px · z∈[-682,-292]mm · 13 of 86 slices shown, 15 images]
[im 4/86  soft-tissue]
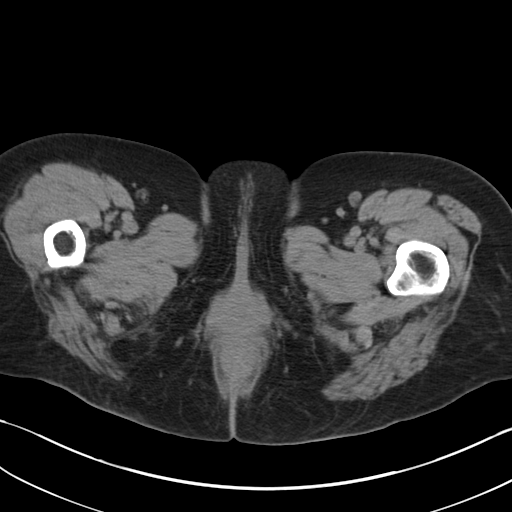
[im 4/86  bone]
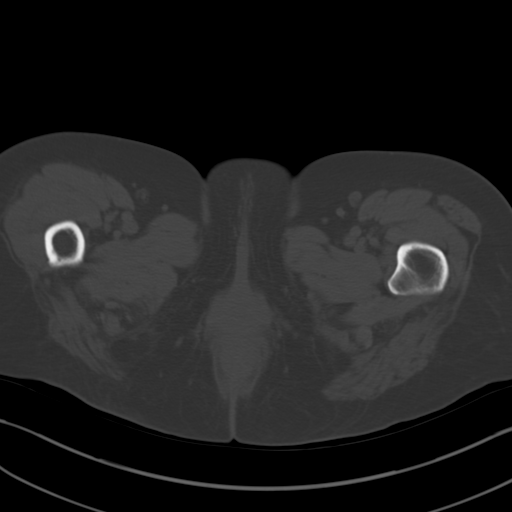
[im 11/86  soft-tissue]
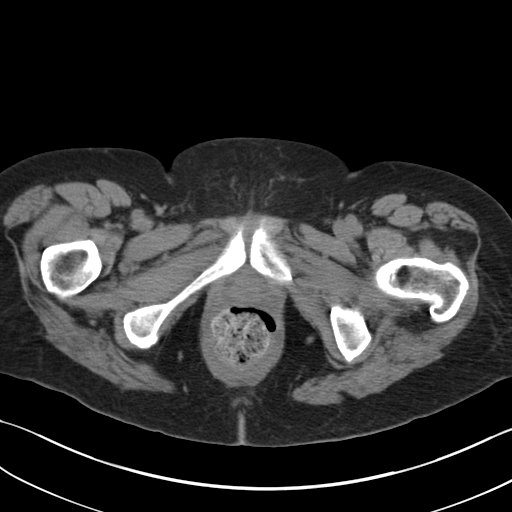
[im 18/86  soft-tissue]
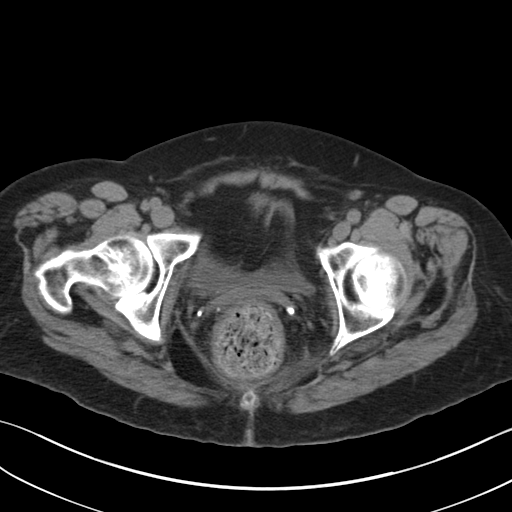
[im 24/86  soft-tissue]
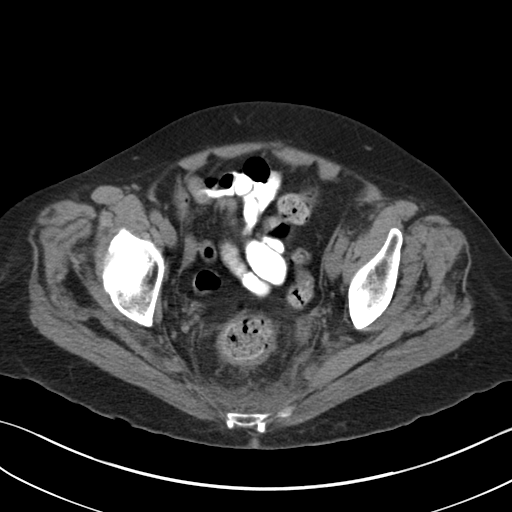
[im 31/86  soft-tissue]
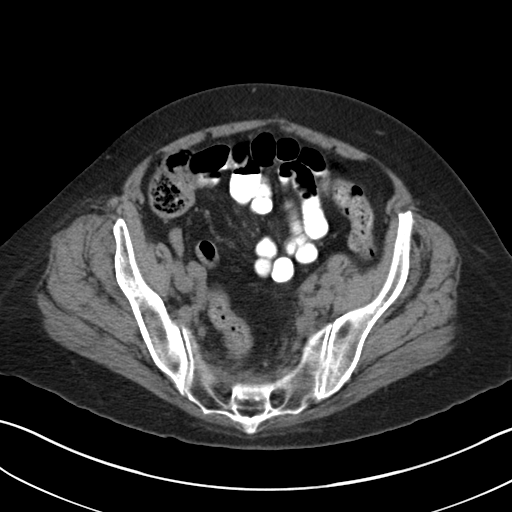
[im 38/86  soft-tissue]
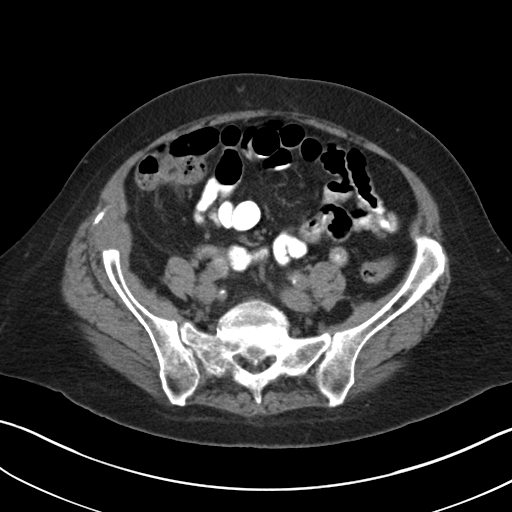
[im 45/86  soft-tissue]
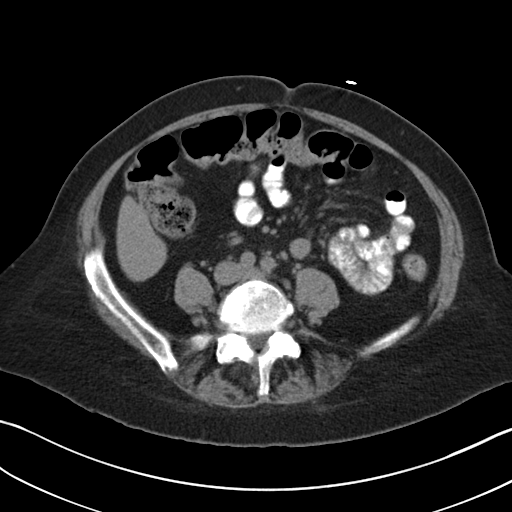
[im 48/86  soft-tissue]
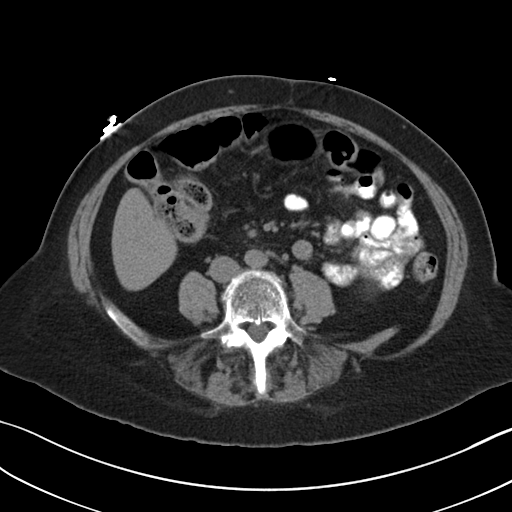
[im 55/86  soft-tissue]
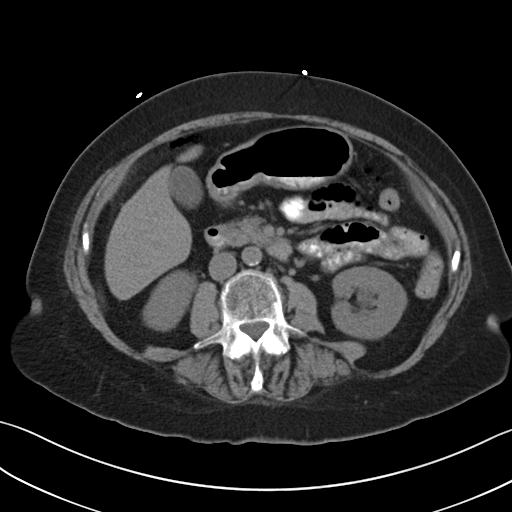
[im 55/86  bone]
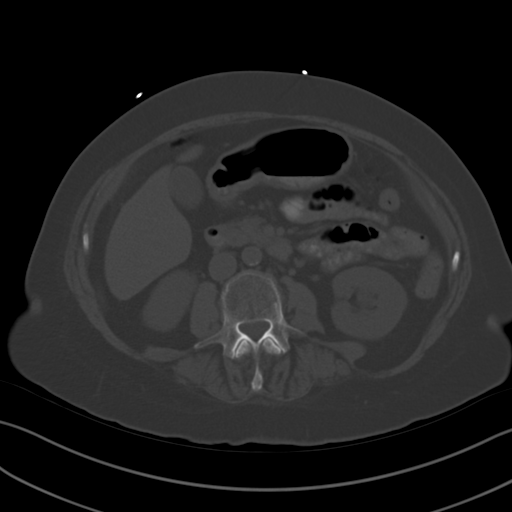
[im 62/86  soft-tissue]
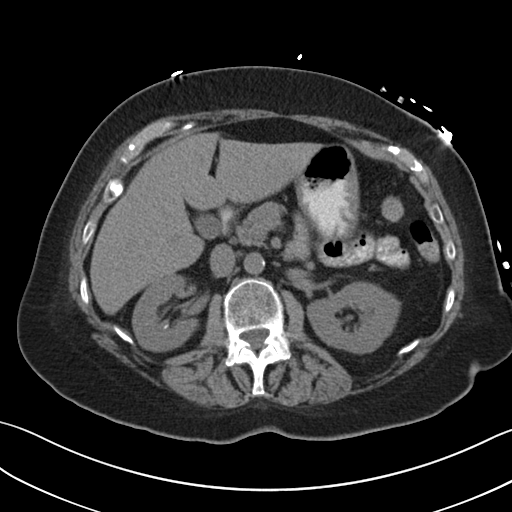
[im 69/86  soft-tissue]
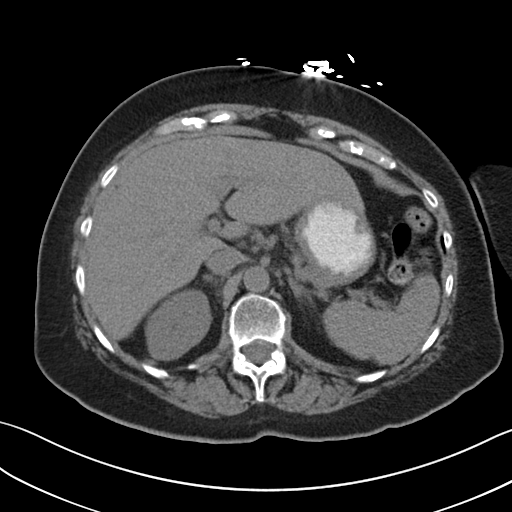
[im 75/86  soft-tissue]
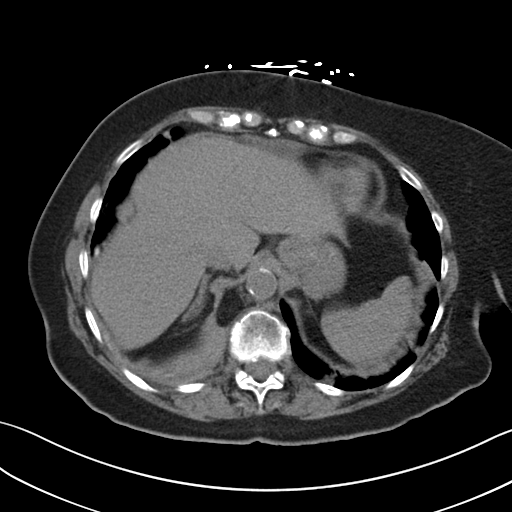
[im 82/86  soft-tissue]
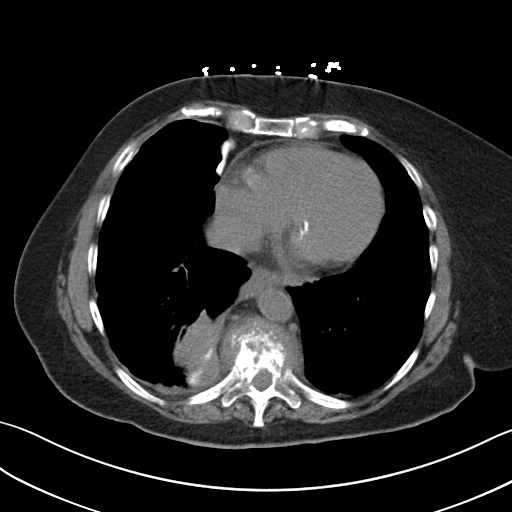

[Series 3: coronal · coronal · 0.74mm/px · 3 of 94 slices shown]
[im 32/94  soft-tissue]
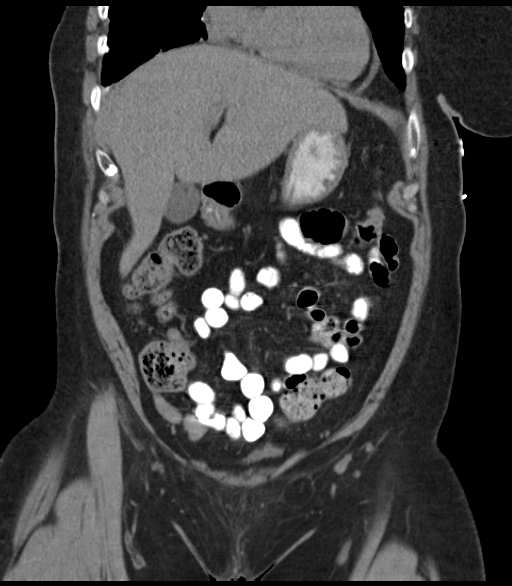
[im 42/94  soft-tissue]
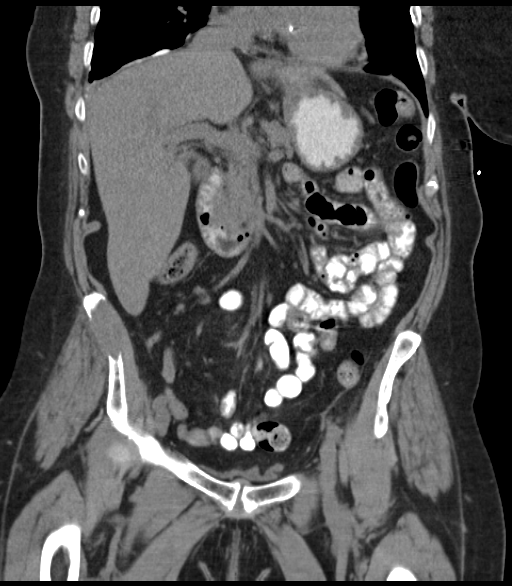
[im 52/94  soft-tissue]
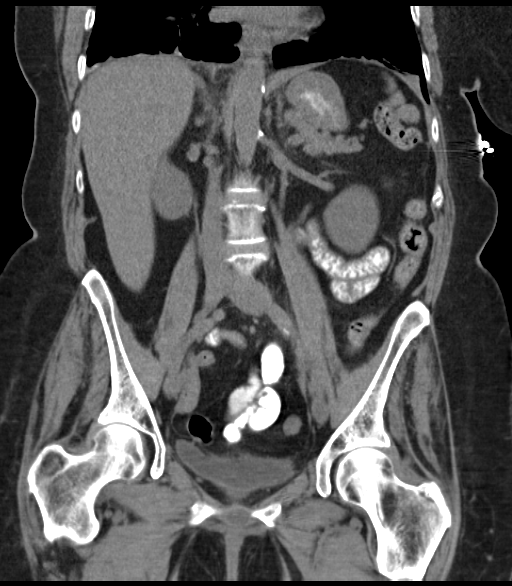

[16 of 46 positions shown; findings below may reference images not displayed]

FINDINGS: Pathologic fracture of T10 vertebral body is noted. Lytic metastatic
lesions are noted in the T9 and T11 vertebral bodies. Lytic
metastatic lesion is noted in right iliac wing as well. Stable
chronic pleural thickening is noted in right lung base posteriorly
consistent with prior pleurodesis.

No gallstones are noted. No focal abnormality is noted the liver,
spleen or pancreas on these unenhanced images. Adrenal glands are
unremarkable. Nonobstructive right nephrolithiasis is noted. No
hydronephrosis or renal obstruction is noted. No ureteral calculi
are noted. The appendix appears normal. There is no evidence of
bowel obstruction. Atherosclerosis of abdominal aorta is noted
without aneurysm formation. No abnormal fluid collection is noted.
Status post hysterectomy. Urinary bladder is decompressed. Ovaries
are unremarkable. No significant adenopathy is noted. Large amount
of stool is noted in the rectum concerning for impaction. There is
noted mild diffuse wall thickening of the rectum concerning for
proctitis. Inflammatory changes are noted in the surrounding
tissues.
IMPRESSION: Pathologic compression fracture of T10 vertebral body is noted.
Lytic metastatic lesions are also noted in the T9 and T11 vertebral
bodies, as well as the right iliac wing.

Nonobstructive right nephrolithiasis. No hydronephrosis or renal
obstruction is noted.

Mild diffuse wall thickening of the rectum is noted with surrounding
inflammatory changes concerning for proctitis. Large amount of stool
is noted in the rectum concerning for impaction.

## 2017-06-21 MED FILL — LETROZOLE 2.5 MG TABLET: 2.5 | 90 days supply | Qty: 90 | Fill #3

## 2017-06-23 ENCOUNTER — Ambulatory Visit (HOSPITAL_COMMUNITY)
Admission: RE | Admit: 2017-06-23 | Discharge: 2017-06-23 | Disposition: A | Discharge status: Home / Self Care | Payer: Medicare Other | Source: Ambulatory Visit | Attending: Hematology and Oncology | Admitting: Hematology and Oncology

## 2017-06-23 ENCOUNTER — Other Ambulatory Visit (HOSPITAL_BASED_OUTPATIENT_CLINIC_OR_DEPARTMENT_OTHER): Payer: Medicare Other

## 2017-06-23 DIAGNOSIS — I2510 Atherosclerotic heart disease of native coronary artery without angina pectoris: Secondary | ICD-10-CM | POA: Insufficient documentation

## 2017-06-23 DIAGNOSIS — C782 Secondary malignant neoplasm of pleura: Secondary | ICD-10-CM | POA: Insufficient documentation

## 2017-06-23 DIAGNOSIS — C7951 Secondary malignant neoplasm of bone: Secondary | ICD-10-CM | POA: Diagnosis not present

## 2017-06-23 DIAGNOSIS — J439 Emphysema, unspecified: Secondary | ICD-10-CM | POA: Insufficient documentation

## 2017-06-23 DIAGNOSIS — J90 Pleural effusion, not elsewhere classified: Secondary | ICD-10-CM | POA: Diagnosis not present

## 2017-06-23 DIAGNOSIS — I700 Atherosclerosis of aorta: Secondary | ICD-10-CM | POA: Diagnosis not present

## 2017-06-23 DIAGNOSIS — C50911 Malignant neoplasm of unspecified site of right female breast: Secondary | ICD-10-CM | POA: Diagnosis not present

## 2017-06-23 LAB — COMPREHENSIVE METABOLIC PANEL
ALT: 13 U/L (ref 0–55)
ANION GAP: 9 meq/L (ref 3–11)
AST: 22 U/L (ref 5–34)
Albumin: 3.9 g/dL (ref 3.5–5.0)
Alkaline Phosphatase: 74 U/L (ref 40–150)
BUN: 22.2 mg/dL (ref 7.0–26.0)
CO2: 21 meq/L — AB (ref 22–29)
Calcium: 9.1 mg/dL (ref 8.4–10.4)
Chloride: 110 mEq/L — ABNORMAL HIGH (ref 98–109)
Creatinine: 1 mg/dL (ref 0.6–1.1)
EGFR: 59 mL/min/{1.73_m2} — AB (ref >60)
Glucose: 82 mg/dl (ref 70–140)
POTASSIUM: 3.9 meq/L (ref 3.5–5.1)
SODIUM: 139 meq/L (ref 136–145)
Total Bilirubin: 0.69 mg/dL (ref 0.20–1.20)
Total Protein: 7.1 g/dL (ref 6.4–8.3)

## 2017-06-23 LAB — CBC WITH DIFFERENTIAL/PLATELET
BASO%: 3.3 % — ABNORMAL HIGH (ref 0.0–2.0)
BASOS ABS: 0.1 10*3/uL (ref 0.0–0.1)
EOS ABS: 0.1 10*3/uL (ref 0.0–0.5)
EOS%: 3.4 % (ref 0.0–7.0)
HCT: 35 % (ref 34.8–46.6)
HEMOGLOBIN: 12 g/dL (ref 11.6–15.9)
LYMPH#: 0.4 10*3/uL — AB (ref 0.9–3.3)
LYMPH%: 24.4 % (ref 14.0–49.7)
MCH: 35.3 pg — AB (ref 25.1–34.0)
MCHC: 34.2 g/dL (ref 31.5–36.0)
MCV: 103.3 fL — ABNORMAL HIGH (ref 79.5–101.0)
MONO#: 0.3 10*3/uL (ref 0.1–0.9)
MONO%: 17.1 % — ABNORMAL HIGH (ref 0.0–14.0)
NEUT#: 0.9 10*3/uL — ABNORMAL LOW (ref 1.5–6.5)
NEUT%: 51.8 % (ref 38.4–76.8)
Platelets: 182 10*3/uL (ref 145–400)
RBC: 3.39 10*6/uL — AB (ref 3.70–5.45)
RDW: 15.1 % — AB (ref 11.2–14.5)
WBC: 1.8 10*3/uL — ABNORMAL LOW (ref 3.9–10.3)

## 2017-06-23 MED ORDER — IOPAMIDOL (ISOVUE-300) INJECTION 61%
INTRAVENOUS | Status: AC
  Filled 2017-06-23: qty 100

## 2017-06-23 MED ORDER — IOPAMIDOL (ISOVUE-300) INJECTION 61%
100.0000 mL | Freq: Once | INTRAVENOUS | Status: AC | PRN
  Administered 2017-06-23: 100 mL via INTRAVENOUS

## 2017-06-24 ENCOUNTER — Ambulatory Visit (HOSPITAL_BASED_OUTPATIENT_CLINIC_OR_DEPARTMENT_OTHER): Payer: Medicare Other

## 2017-06-24 ENCOUNTER — Ambulatory Visit: Payer: Medicare Other | Admitting: Hematology and Oncology

## 2017-06-24 ENCOUNTER — Other Ambulatory Visit: Payer: Medicare Other

## 2017-06-24 ENCOUNTER — Telehealth: Payer: Self-pay | Admitting: Hematology and Oncology

## 2017-06-24 DIAGNOSIS — C7802 Secondary malignant neoplasm of left lung: Secondary | ICD-10-CM | POA: Diagnosis not present

## 2017-06-24 DIAGNOSIS — C782 Secondary malignant neoplasm of pleura: Secondary | ICD-10-CM | POA: Diagnosis not present

## 2017-06-24 DIAGNOSIS — C50911 Malignant neoplasm of unspecified site of right female breast: Secondary | ICD-10-CM

## 2017-06-24 DIAGNOSIS — C7951 Secondary malignant neoplasm of bone: Secondary | ICD-10-CM

## 2017-06-24 DIAGNOSIS — C7801 Secondary malignant neoplasm of right lung: Secondary | ICD-10-CM | POA: Diagnosis not present

## 2017-06-24 DIAGNOSIS — C50919 Malignant neoplasm of unspecified site of unspecified female breast: Secondary | ICD-10-CM

## 2017-06-24 DIAGNOSIS — D701 Agranulocytosis secondary to cancer chemotherapy: Secondary | ICD-10-CM

## 2017-06-24 DIAGNOSIS — T451X5A Adverse effect of antineoplastic and immunosuppressive drugs, initial encounter: Principal | ICD-10-CM

## 2017-06-24 MED ORDER — DENOSUMAB 120 MG/1.7ML ~~LOC~~ SOLN
120.0000 mg | Freq: Once | SUBCUTANEOUS | Status: AC
  Administered 2017-06-24: 120 mg via SUBCUTANEOUS
  Filled 2017-06-24: qty 1.7

## 2017-06-24 NOTE — Progress Notes (Signed)
Patient Care Team: [name], MD as PCP - General (Internal Medicine)  DIAGNOSIS:  Encounter Diagnosis  Name Primary?  . 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 ([place])     SUMMARY OF ONCOLOGIC HISTORY:   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 [facility])   1998 Initial Diagnosis    Right breast carcinoma T2N1 ER/PR positive at right mastectomy with node evaluation Dec 1998, treated with adjuvant adriamycin cytoxan followed by 5 years of tamoxifen      2010 Relapse/Recurrence    Metastatic breast cancer to the pleura      08/29/2008 - 08/30/2015 Anti-estrogen oral therapy    Aromasin followed by letrozole      07/2015 Relapse/Recurrence    Progression to bone required aggressive laminectomy February 2017      08/30/2015 - 09/13/2015 Radiation Therapy    Palliative radiation to the spine,  Right ilium ad right hip      01/05/2016 - 04/29/2016 Chemotherapy    Xeloda stopped when she had progression of dsease       05/02/2016 PET scan     innumerable hypermetabolic lesions throughout the axial and appendicular skeleton, increase in size and number of new lesions in skull, right scapular glenoid, throughout sacrum. Findings concerning for liver metastases, numerous pulmonary nodules bilaterally      05/09/2016 - 09/18/2016 Chemotherapy    Taxol weekly initially and later changed her day 1 day 8 every 3 weeks (complicated by A. fib with RVR and prolonged respiratory infection) along with Xgeva       09/19/2016 Procedure    Biopsy of lung mass: Metastatic carcinoma consistent with breast origin, HER-2 negative ratio 1.37      10/01/2016 -  Anti-estrogen oral therapy    Palbociclib and Letrozole      06/23/2017 Imaging    CT chest abdomen pelvis: No evidence of progression of metastatic disease, bone metastases stable right-sided pleural metastases and pleural effusions are stable       CHIEF COMPLIANT: Follow-up to review of scans  INTERVAL HISTORY: Lisa [name]  is a 76 year old with metastatic breast cancer who is here for follow-up to review the CT scans.  She has been doing quite well without any new complaints or problems.  She is tolerating Ibrance extremely well.  REVIEW OF SYSTEMS:   Constitutional: Denies fevers, chills or abnormal weight loss Eyes: Denies blurriness of vision Ears, nose, mouth, throat, and face: Denies mucositis or sore throat Respiratory: Denies cough, dyspnea or wheezes Cardiovascular: Denies palpitation, chest discomfort Gastrointestinal:  Denies nausea, heartburn or change in bowel habits Skin: Denies abnormal skin rashes Lymphatics: Denies new lymphadenopathy or easy bruising Neurological:Denies numbness, tingling or new weaknesses Behavioral/Psych: Mood is stable, no new changes  Extremities: No lower extremity edema Breast:  denies any pain or lumps or nodules in either breasts All other systems were reviewed with the patient and are negative.  I have reviewed the past medical history, past surgical history, social history and family history with the patient and they are unchanged from previous note.  ALLERGIES:  is allergic to diphenhydramine hcl; codeine sulfate; metoprolol; oseltamivir phosphate; oxycodone-acetaminophen; prednisone; and sudafed [pseudoephedrine hcl].  MEDICATIONS:  Current Outpatient Medications  Medication Sig Dispense Refill  . acetaminophen (TYLENOL) 500 MG tablet Take 500 mg by mouth every 6 (six) hours as needed for moderate pain.    [name] apixaban (ELIQUIS) 5 MG TABS tablet Take 1 tablet (5 mg total) by mouth 2 (two) times daily. 180 tablet 3  .  fluticasone (FLONASE) 50 MCG/ACT nasal spray Place 1 spray 2 (two) times daily as needed into both nostrils for allergies or rhinitis.    . furosemide (LASIX) 20 MG tablet Take 1 tablet (20 mg total) 3 (three) times a week by mouth. Mondays, Wednesdays, and Fridays 45 tablet 3  . IBRANCE 100 MG capsule TAKE 1 CAPSULE BY MOUTH ONCE DAILY WITH  BREAKFAST. TAKE WHOLE WITH FOOD 21 capsule 3  . letrozole (FEMARA) 2.5 MG tablet Take 1 tablet (2.5 mg total) by mouth daily. 90 tablet 3  . loratadine (CLARITIN) 10 MG tablet Take 10 mg by mouth daily.     [name] losartan (COZAAR) 50 MG tablet Take 100 mg daily by mouth.  2  . Potassium 99 MG TABS Take 2 tablets 3 (three) times a week by mouth. Mondays, Wednesdays, and Fridays    . pravastatin (PRAVACHOL) 40 MG tablet Take 40 mg daily by mouth.    . vitamin B-12 (CYANOCOBALAMIN) 1000 MCG tablet Take 1,000 mcg by mouth 2 (two) times daily.    . vitamin C (ASCORBIC ACID) 500 MG tablet Take 500 mg by mouth daily.      . vitamin E 200 UNIT capsule Take 200 Units by mouth daily.       No current facility-administered medications for this visit.     PHYSICAL EXAMINATION: ECOG PERFORMANCE STATUS: 1 - Symptomatic but completely ambulatory  There were no vitals filed for this visit. There were no vitals filed for this visit.  GENERAL:alert, no distress and comfortable SKIN: skin color, texture, turgor are normal, no rashes or significant lesions EYES: normal, Conjunctiva are pink and non-injected, sclera clear OROPHARYNX:no exudate, no erythema and lips, buccal mucosa, and tongue normal  NECK: supple, thyroid normal size, non-tender, without nodularity LYMPH:  no palpable lymphadenopathy in the cervical, axillary or inguinal LUNGS: clear to auscultation and percussion with normal breathing effort HEART: regular rate & rhythm and no murmurs and no lower extremity edema ABDOMEN:abdomen soft, non-tender and normal bowel sounds MUSCULOSKELETAL:no cyanosis of digits and no clubbing  NEURO: alert & oriented x 3 with fluent speech, no focal motor/sensory deficits EXTREMITIES: No lower extremity edema   LABORATORY DATA:  I have reviewed the data as listed   Chemistry      Component Value Date/Time   NA 139 06/23/2017 0930   K 3.9 06/23/2017 0930   CL 103 05/29/2017 1601   CL 108 (H) 01/13/2013  0806   CO2 21 (L) 06/23/2017 0930   BUN 22.2 06/23/2017 0930   CREATININE 1.0 06/23/2017 0930   GLU 114 09/12/2015      Component Value Date/Time   CALCIUM 9.1 06/23/2017 0930   ALKPHOS 74 06/23/2017 0930   AST 22 06/23/2017 0930   ALT 13 06/23/2017 0930   BILITOT 0.69 06/23/2017 0930       Lab Results  Component Value Date   WBC 1.8 (L) 06/23/2017   HGB 12.0 06/23/2017   HCT 35.0 06/23/2017   MCV 103.3 (H) 06/23/2017   PLT 182 06/23/2017   NEUTROABS 0.9 (L) 06/23/2017    ASSESSMENT & PLAN:  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 [name]) Metastatic breast cancer tolungs and bone: Clinically improving onweekly taxol beginning 05-09-16, several delays including for counts, rapid A fib, respiratory infection etc  Treatment summary: Taxol days 1 and 8 every 3 weeks with Neupogen along with Xgeva; completed 4cycles from 05/09/2016-08/29/2016 Taxol toxicities: Neutropenia: Requiring Neupogen with each treatment  Atrial fibrillation: Recurrent with RVR, cardioverted 07/25/2016, amiodarone,  metoprolol and Lasix for CHF (Dr. Sallyanne Kuster) CKD stage III Pathologic compression fracture T10 status post radiation and laminectomy T6-L1 09/01/2015, radiation to right iliac area 05/2016  Rebiopsy of the lung nodule 09/19/2016: Metastatic breast cancer that is HER-2 negative,  -------------------------------------------------------------------------------------------------------------------------------------------- Current treatment: LetrozolewithPalbociclib started 10/01/2016  Palbociclib toxicities: 1. Neutropenia: Currently Palbociclib dose is 100 mg daily. Bone metastases: She will receive Xgeva today and will receive it every 3 months.  CT chest abdomen pelvis 06/23/2017: Stable skeletal and soft tissue metastases, stable pleural metastases  Return to clinic every 3 months with Xgeva and follow Scans every 6 months   I spent 25 minutes talking to the patient of which  more than half was spent in counseling and coordination of care.  No orders of the defined types were placed in this encounter.  The patient has a good understanding of the overall plan. she agrees with it. she will call with any problems that may develop before the next visit here.   Rulon Eisenmenger, MD 06/24/17

## 2017-06-24 NOTE — Assessment & Plan Note (Signed)
Metastatic breast cancer tolungs and bone: Clinically improving onweekly taxol beginning 05-09-16, several delays including for counts, rapid A fib, respiratory infection etc  Treatment summary: Taxol days 1 and 8 every 3 weeks with Neupogen along with Xgeva; completed 4cycles from 05/09/2016-08/29/2016 Taxol toxicities: Neutropenia: Requiring Neupogen with each treatment  Atrial fibrillation: Recurrent with RVR, cardioverted 07/25/2016, amiodarone, metoprolol and Lasix for CHF (Dr. Sallyanne Kuster) CKD stage III Pathologic compression fracture T10 status post radiation and laminectomy T6-L1 09/01/2015, radiation to right iliac area 05/2016  Rebiopsy of the lung nodule 09/19/2016: Metastatic breast cancer that is HER-2 negative,  -------------------------------------------------------------------------------------------------------------------------------------------- Current treatment: LetrozolewithPalbociclib started 10/01/2016  Palbociclib toxicities: 1. Neutropenia: Currently Palbociclib dose is 100 mg daily. Bone metastases: She will receive Xgeva today and will receive it every 3 months.  CT chest abdomen pelvis 06/23/2017: Stable skeletal and soft tissue metastases, stable pleural metastases  Return to clinic every 3 months with Xgeva and follow Scans every 6 months

## 2017-06-24 NOTE — Patient Instructions (Signed)

## 2017-06-24 NOTE — Telephone Encounter (Signed)
Gave patient AVS and calendar of upcoming March 2019 appointments.  °

## 2017-07-13 MED FILL — IBRANCE 100 MG CAPSULE: 100 | 21 days supply | Qty: 21 | Fill #1

## 2017-07-28 MED FILL — LOSARTAN POTASSIUM 50 MG TA: 50 | 90 days supply | Qty: 180 | Fill #0

## 2017-08-11 ENCOUNTER — Other Ambulatory Visit: Payer: Self-pay | Admitting: Cardiovascular Disease

## 2017-08-11 MED FILL — ELIQUIS 5 MG TABLET: 5 | 90 days supply | Qty: 180 | Fill #0

## 2017-08-12 MED FILL — IBRANCE 100 MG CAPSULE: 100 | 21 days supply | Qty: 21 | Fill #2

## 2017-09-01 ENCOUNTER — Other Ambulatory Visit: Payer: Self-pay | Admitting: Cardiovascular Disease

## 2017-09-03 MED ORDER — PRAVASTATIN SODIUM 40 MG PO TABS: 40.0000 mg | ORAL_TABLET | Freq: Every evening | ORAL | 3 refills | Status: DC

## 2017-09-03 MED FILL — IBRANCE 100 MG CAPSULE: 100 | 21 days supply | Qty: 21 | Fill #3

## 2017-09-03 MED FILL — PRAVASTATIN NA 40 MG TAB: 40 | 90 days supply | Qty: 90 | Fill #0

## 2017-09-03 NOTE — Telephone Encounter (Signed)
Refilled as requested  

## 2017-09-03 NOTE — Addendum Note (Signed)
Addended by: Alvina Filbert B on: 09/03/2017 02:23 PM   Modules accepted: Orders

## 2017-09-03 NOTE — Telephone Encounter (Signed)
F/U call  *STAT* If patient is at the pharmacy, call can be transferred to refill team.   1. Which medications need to be refilled? (please list name of each medication and dose if known) Pravastatin 40 mg  2. Which pharmacy/location (including street and city if local pharmacy) is medication to be sent to? [address], [place] [address]   3. Do they need a 30 day or 90 day supply?[facility]

## 2017-09-16 ENCOUNTER — Other Ambulatory Visit: Payer: Self-pay | Admitting: Hematology and Oncology

## 2017-09-16 MED FILL — LETROZOLE 2.5 MG TABLET: 2.5 | 90 days supply | Qty: 90 | Fill #0

## 2017-09-23 ENCOUNTER — Telehealth: Payer: Self-pay | Admitting: Hematology and Oncology

## 2017-09-23 ENCOUNTER — Inpatient Hospital Stay: Payer: Medicare Other | Attending: Hematology and Oncology

## 2017-09-23 ENCOUNTER — Inpatient Hospital Stay: Payer: Medicare Other

## 2017-09-23 ENCOUNTER — Inpatient Hospital Stay (HOSPITAL_BASED_OUTPATIENT_CLINIC_OR_DEPARTMENT_OTHER): Payer: Medicare Other | Admitting: Hematology and Oncology

## 2017-09-23 DIAGNOSIS — C50911 Malignant neoplasm of unspecified site of right female breast: Secondary | ICD-10-CM

## 2017-09-23 DIAGNOSIS — D701 Agranulocytosis secondary to cancer chemotherapy: Secondary | ICD-10-CM

## 2017-09-23 DIAGNOSIS — C7951 Secondary malignant neoplasm of bone: Secondary | ICD-10-CM

## 2017-09-23 DIAGNOSIS — Z79811 Long term (current) use of aromatase inhibitors: Secondary | ICD-10-CM

## 2017-09-23 DIAGNOSIS — C782 Secondary malignant neoplasm of pleura: Secondary | ICD-10-CM

## 2017-09-23 LAB — CBC WITH DIFFERENTIAL/PLATELET
BASOS PCT: 2 %
Basophils Absolute: 0.1 10*3/uL (ref 0.0–0.1)
EOS ABS: 0.1 10*3/uL (ref 0.0–0.5)
EOS PCT: 3 %
HCT: 37.2 % (ref 34.8–46.6)
HEMOGLOBIN: 12.8 g/dL (ref 11.6–15.9)
LYMPHS ABS: 0.7 10*3/uL — AB (ref 0.9–3.3)
Lymphocytes Relative: 26 %
MCH: 34.6 pg — AB (ref 25.1–34.0)
MCHC: 34.4 g/dL (ref 31.5–36.0)
MCV: 100.5 fL (ref 79.5–101.0)
Monocytes Absolute: 0.4 10*3/uL (ref 0.1–0.9)
Monocytes Relative: 14 %
NEUTROS PCT: 55 %
Neutro Abs: 1.5 10*3/uL (ref 1.5–6.5)
PLATELETS: 200 10*3/uL (ref 145–400)
RBC: 3.7 MIL/uL (ref 3.70–5.45)
RDW: 14 % (ref 11.2–14.5)
WBC: 2.7 10*3/uL — AB (ref 3.9–10.3)

## 2017-09-23 LAB — COMPREHENSIVE METABOLIC PANEL
ALK PHOS: 67 U/L (ref 40–150)
ALT: 18 U/L (ref 0–55)
AST: 26 U/L (ref 5–34)
Albumin: 3.9 g/dL (ref 3.5–5.0)
Anion gap: 10 (ref 3–11)
BILIRUBIN TOTAL: 0.7 mg/dL (ref 0.2–1.2)
BUN: 29 mg/dL — AB (ref 7–26)
CALCIUM: 10 mg/dL (ref 8.4–10.4)
CO2: 22 mmol/L (ref 22–29)
CREATININE: 1.08 mg/dL (ref 0.60–1.10)
Chloride: 107 mmol/L (ref 98–109)
GFR, EST AFRICAN AMERICAN: 57 mL/min — AB (ref >60)
GFR, EST NON AFRICAN AMERICAN: 49 mL/min — AB (ref >60)
Glucose, Bld: 95 mg/dL (ref 70–140)
Potassium: 4.1 mmol/L (ref 3.5–5.1)
Sodium: 139 mmol/L (ref 136–145)
Total Protein: 7.1 g/dL (ref 6.4–8.3)

## 2017-09-23 MED ORDER — DENOSUMAB 120 MG/1.7ML ~~LOC~~ SOLN
120.0000 mg | Freq: Once | SUBCUTANEOUS | Status: AC
  Administered 2017-09-23: 120 mg via SUBCUTANEOUS

## 2017-09-23 MED ORDER — PALBOCICLIB 100 MG PO CAPS: ORAL_CAPSULE | ORAL | 3 refills | Status: DC

## 2017-09-23 NOTE — Progress Notes (Signed)
Patient Care Team: [name], MD as PCP - General (Internal Medicine)  DIAGNOSIS:  Encounter Diagnosis  Name Primary?  . 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 ([place])     SUMMARY OF ONCOLOGIC HISTORY:   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 [facility])   1998 Initial Diagnosis    Right breast carcinoma T2N1 ER/PR positive at right mastectomy with node evaluation Dec 1998, treated with adjuvant adriamycin cytoxan followed by 5 years of tamoxifen      2010 Relapse/Recurrence    Metastatic breast cancer to the pleura      08/29/2008 - 08/30/2015 Anti-estrogen oral therapy    Aromasin followed by letrozole      07/2015 Relapse/Recurrence    Progression to bone required aggressive laminectomy February 2017      08/30/2015 - 09/13/2015 Radiation Therapy    Palliative radiation to the spine,  Right ilium ad right hip      01/05/2016 - 04/29/2016 Chemotherapy    Xeloda stopped when she had progression of dsease       05/02/2016 PET scan     innumerable hypermetabolic lesions throughout the axial and appendicular skeleton, increase in size and number of new lesions in skull, right scapular glenoid, throughout sacrum. Findings concerning for liver metastases, numerous pulmonary nodules bilaterally      05/09/2016 - 09/18/2016 Chemotherapy    Taxol weekly initially and later changed her day 1 day 8 every 3 weeks (complicated by A. fib with RVR and prolonged respiratory infection) along with Xgeva       09/19/2016 Procedure    Biopsy of lung mass: Metastatic carcinoma consistent with breast origin, HER-2 negative ratio 1.37      10/01/2016 -  Anti-estrogen oral therapy    Palbociclib and Letrozole      06/23/2017 Imaging    CT chest abdomen pelvis: No evidence of progression of metastatic disease, bone metastases stable right-sided pleural metastases and pleural effusions are stable       CHIEF COMPLIANT: Follow-up of metastatic breast cancer and Ibrance  INTERVAL  HISTORY: Lisa [name] is a 77 year old with above-mentioned history metastatic breast cancer on Ibrance and letrozole.  She is tolerating both of these drugs very well.  She does have intermittent joint aches and pains but otherwise doing quite well.  She is here today accompanied by her mother.  REVIEW OF SYSTEMS:   Constitutional: Denies fevers, chills or abnormal weight loss Eyes: Denies blurriness of vision Ears, nose, mouth, throat, and face: Denies mucositis or sore throat Respiratory: Denies cough, dyspnea or wheezes Cardiovascular: Denies palpitation, chest discomfort Gastrointestinal:  Denies nausea, heartburn or change in bowel habits Skin: Denies abnormal skin rashes Lymphatics: Denies new lymphadenopathy or easy bruising Neurological:Denies numbness, tingling or new weaknesses Behavioral/Psych: Mood is stable, no new changes  Extremities: No lower extremity edema  All other systems were reviewed with the patient and are negative.  I have reviewed the past medical history, past surgical history, social history and family history with the patient and they are unchanged from previous note.  ALLERGIES:  is allergic to diphenhydramine hcl; codeine sulfate; metoprolol; oseltamivir phosphate; oxycodone-acetaminophen; prednisone; and sudafed [pseudoephedrine hcl].  MEDICATIONS:  Current Outpatient Medications  Medication Sig Dispense Refill  . acetaminophen (TYLENOL) 500 MG tablet Take 500 mg by mouth every 6 (six) hours as needed for moderate pain.    [name] ELIQUIS 5 MG TABS tablet TAKE 1 TABLET (5 MG TOTAL) BY MOUTH 2 (TWO) TIMES DAILY. 180 tablet 1  .  fluticasone (FLONASE) 50 MCG/ACT nasal spray Place 1 spray 2 (two) times daily as needed into both nostrils for allergies or rhinitis.    . furosemide (LASIX) 20 MG tablet Take 1 tablet (20 mg total) 3 (three) times a week by mouth. Mondays, Wednesdays, and Fridays 45 tablet 3  . letrozole (FEMARA) 2.5 MG tablet TAKE 1 TABLET (2.5 MG  TOTAL) BY MOUTH DAILY. 90 tablet 3  . loratadine (CLARITIN) 10 MG tablet Take 10 mg by mouth daily.     [name] losartan (COZAAR) 50 MG tablet Take 100 mg daily by mouth.  2  . palbociclib (IBRANCE) 100 MG capsule TAKE 1 CAPSULE BY MOUTH ONCE DAILY WITH BREAKFAST. TAKE WHOLE WITH FOOD 21 capsule 3  . Potassium 99 MG TABS Take 2 tablets 3 (three) times a week by mouth. Mondays, Wednesdays, and Fridays    . pravastatin (PRAVACHOL) 40 MG tablet Take 1 tablet (40 mg total) by mouth every evening. 90 tablet 3  . vitamin B-12 (CYANOCOBALAMIN) 1000 MCG tablet Take 1,000 mcg by mouth 2 (two) times daily.    . vitamin C (ASCORBIC ACID) 500 MG tablet Take 500 mg by mouth daily.      . vitamin E 200 UNIT capsule Take 200 Units by mouth daily.       No current facility-administered medications for this visit.     PHYSICAL EXAMINATION: ECOG PERFORMANCE STATUS: 1 - Symptomatic but completely ambulatory  Vitals:   09/23/17 1452  BP: (!) 149/65  Pulse: 76  Resp: 18  Temp: 98.8 F (37.1 C)  SpO2: 99%   Filed Weights   09/23/17 1452  Weight: 170 lb 1.6 oz (77.2 kg)    GENERAL:alert, no distress and comfortable SKIN: skin color, texture, turgor are normal, no rashes or significant lesions EYES: normal, Conjunctiva are pink and non-injected, sclera clear OROPHARYNX:no exudate, no erythema and lips, buccal mucosa, and tongue normal  NECK: supple, thyroid normal size, non-tender, without nodularity LYMPH:  no palpable lymphadenopathy in the cervical, axillary or inguinal LUNGS: clear to auscultation and percussion with normal breathing effort HEART: regular rate & rhythm and no murmurs and no lower extremity edema ABDOMEN:abdomen soft, non-tender and normal bowel sounds MUSCULOSKELETAL:no cyanosis of digits and no clubbing  NEURO: alert & oriented x 3 with fluent speech, no focal motor/sensory deficits EXTREMITIES: No lower extremity edema  LABORATORY DATA:  I have reviewed the data as listed CMP  Latest Ref Rng & Units 09/23/2017 06/23/2017 05/29/2017  Glucose 70 - 140 mg/dL 95 82 125(H)  BUN 7 - 26 mg/dL 29(H) 22.2 22  Creatinine 0.60 - 1.10 mg/dL 1.08 1.0 1.05(H)  Sodium 136 - 145 mmol/L 139 139 140  Potassium 3.5 - 5.1 mmol/L 4.1 3.9 4.2  Chloride 98 - 109 mmol/L 107 - 103  CO2 22 - 29 mmol/L 22 21(L) 21  Calcium 8.4 - 10.4 mg/dL 10.0 9.1 9.1  Total Protein 6.4 - 8.3 g/dL 7.1 7.1 6.3  Total Bilirubin 0.2 - 1.2 mg/dL 0.7 0.69 0.6  Alkaline Phos 40 - 150 U/L 67 74 75  AST 5 - 34 U/L _0 ALT 0 - 55 U/L _1 Lab Results  Component Value Date   WBC 2.7 (L) 09/23/2017   HGB 12.8 09/23/2017   HCT 37.2 09/23/2017   MCV 100.5 09/23/2017   PLT 200 09/23/2017   NEUTROABS 1.5 09/23/2017    ASSESSMENT & PLAN:  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 [name]) Metastatic breast cancer  tolungs and bone: Clinically improving onweekly taxol beginning 05-09-16, several delays including for counts, rapid A fib, respiratory infection etc  Treatment summary: Taxol days 1 and 8 every 3 weeks with Neupogen along with Xgeva; completed 4cycles from 05/09/2016-08/29/2016 Taxol toxicities: Neutropenia: Requiring Neupogen with each treatment  Atrial fibrillation: Recurrent with RVR, cardioverted 07/25/2016, amiodarone, metoprolol and Lasix for CHF (Dr. Sallyanne Kuster) CKD stage III Pathologic compression fracture T10 status post radiation and laminectomy T6-L1 09/01/2015, radiation to right iliac area 05/2016  Rebiopsy of the lung nodule 09/19/2016: Metastatic breast cancer that is HER-2 negative,  -------------------------------------------------------------------------------------------------------------------------------------------- Current treatment: LetrozolewithPalbociclib started 10/01/2016  Palbociclib toxicities: 1. Neutropenia: Currently Palbociclib dose is 100 mg daily. Bone metastases: She will receive Xgeva today and will receive it every 3 months.  CT chest  abdomen pelvis 06/23/2017: Stable skeletal and soft tissue metastases, stable pleural metastases  Return to clinic every 53month with Xgeva and follow Scans every 6 months.  I will arrange for scans in 3 months and follow-up after     I spent 25 minutes talking to the patient of which more than half was spent in counseling and coordination of care.  Orders Placed This Encounter  Procedures  . CT Abdomen Pelvis W Contrast    Standing Status:   Future    Standing Expiration Date:   09/23/2018    Order Specific Question:   If indicated for the ordered procedure, I authorize the administration of contrast media per Radiology protocol    Answer:   Yes    Order Specific Question:   [facility]?    [facility]   Order Specific Question:   Radiology Contrast Protocol - do NOT remove file path    Answer:   _0 [email or web address]    Order Specific Question:   Reason for Exam additional comments    Answer:   Metastatic breast cancer restaging  . CT Chest W Contrast    Standing Status:   Future    Standing Expiration Date:   09/23/2018    Order Specific Question:   If indicated for the ordered procedure, I authorize the administration of contrast media per Radiology protocol    Answer:   Yes    Order Specific Question:   [facility]    [facility]   Order Specific Question:   Radiology Contrast Protocol - do NOT remove file path    Answer:   _1 [email or web address]    Order Specific Question:   Reason for Exam additional comments    Answer:   Metastatic breast cancer restaging  . CBC with Differential (Cancer Center Only)    Standing Status:   Future    Standing Expiration Date:   09/24/2018  . C[place] ([facility]    Standing Status:   Future    Standing Expiration Date:   09/24/2018   The patient has a good understanding of the overall plan. she agrees with it. she will call  with any problems that may develop before the next visit here.   VHarriette Ohara MD 09/23/17

## 2017-09-23 NOTE — Telephone Encounter (Signed)
Gave patient AVS and calendar of upcoming June appointments.  °

## 2017-09-23 NOTE — Assessment & Plan Note (Signed)
Metastatic breast cancer tolungs and bone: Clinically improving onweekly taxol beginning 05-09-16, several delays including for counts, rapid A fib, respiratory infection etc  Treatment summary: Taxol days 1 and 8 every 3 weeks with Neupogen along with Xgeva; completed 4cycles from 05/09/2016-08/29/2016 Taxol toxicities: Neutropenia: Requiring Neupogen with each treatment  Atrial fibrillation: Recurrent with RVR, cardioverted 07/25/2016, amiodarone, metoprolol and Lasix for CHF (Dr. Sallyanne Kuster) CKD stage III Pathologic compression fracture T10 status post radiation and laminectomy T6-L1 09/01/2015, radiation to right iliac area 05/2016  Rebiopsy of the lung nodule 09/19/2016: Metastatic breast cancer that is HER-2 negative,  -------------------------------------------------------------------------------------------------------------------------------------------- Current treatment: LetrozolewithPalbociclib started 10/01/2016  Palbociclib toxicities: 1. Neutropenia: Currently Palbociclib dose is 100 mg daily. Bone metastases: She will receive Xgeva today and will receive it every 3 months.  CT chest abdomen pelvis 06/23/2017: Stable skeletal and soft tissue metastases, stable pleural metastases  Return to clinic every 94month with Xgeva and follow Scans every 6 months.  I will arrange for scans in 3 months and follow-up after

## 2017-10-08 ENCOUNTER — Other Ambulatory Visit: Payer: Self-pay | Admitting: Pharmacist

## 2017-10-10 MED FILL — IBRANCE 100 MG CAPSULE: 100 | 28 days supply | Qty: 21 | Fill #0

## 2017-10-22 MED FILL — LOSARTAN POTASSIUM 50 MG TA: 50 | 90 days supply | Qty: 180 | Fill #1

## 2017-11-03 MED FILL — IBRANCE 100 MG CAPSULE: 100 | 28 days supply | Qty: 21 | Fill #1

## 2017-11-15 MED FILL — ELIQUIS 5 MG TABLET: 5 | 90 days supply | Qty: 180 | Fill #1

## 2017-11-28 MED FILL — IBRANCE 100 MG CAPSULE: 100 | 28 days supply | Qty: 21 | Fill #2

## 2017-11-29 MED FILL — PRAVASTATIN NA 40 MG TAB: 40 | 90 days supply | Qty: 90 | Fill #1

## 2017-12-01 ENCOUNTER — Ambulatory Visit (INDEPENDENT_AMBULATORY_CARE_PROVIDER_SITE_OTHER): Payer: Medicare Other | Admitting: Cardiovascular Disease

## 2017-12-01 ENCOUNTER — Encounter: Payer: Self-pay | Admitting: Cardiovascular Disease

## 2017-12-01 VITALS — BP 136/88 | HR 69 | Ht 62.0 in | Wt 167.0 lb

## 2017-12-01 DIAGNOSIS — I10 Essential (primary) hypertension: Secondary | ICD-10-CM

## 2017-12-01 DIAGNOSIS — Z7901 Long term (current) use of anticoagulants: Secondary | ICD-10-CM | POA: Diagnosis not present

## 2017-12-01 DIAGNOSIS — E7800 Pure hypercholesterolemia, unspecified: Secondary | ICD-10-CM | POA: Diagnosis not present

## 2017-12-01 DIAGNOSIS — I480 Paroxysmal atrial fibrillation: Secondary | ICD-10-CM

## 2017-12-01 DIAGNOSIS — Z8679 Personal history of other diseases of the circulatory system: Secondary | ICD-10-CM | POA: Diagnosis not present

## 2017-12-01 DIAGNOSIS — I5032 Chronic diastolic (congestive) heart failure: Secondary | ICD-10-CM | POA: Diagnosis not present

## 2017-12-01 NOTE — Progress Notes (Signed)
Dictation #1 [id number]  [id number]    Cardiology Office Note    Date:  12/01/2017   ID:  Lisa [name], DOB 03/02/1941, MRN [id number]  PCP:  [name], MD  Cardiologist:   [name], MD   Chief Complaint  Patient presents with  . Atrial Fibrillation    History of Present Illness:  Lisa [name] is a 77 y.o. female with paroxysmal atrial fibrillation that led to decompensated heart failure in late 2017, cardioversion 05/27/2016, with recurrence and a successful cardioversion in ED 07/25/2016.    Under a lot of emotional stress since her mother is dying from critical aortic stenosis, still at home home hospice is helping.  Despite being off amiodarone for 6 months, there has been no recurrence of atrial fibrillation.  Neuropathic symptoms have not really improved. She is on anticoagulation with Eliquis without bleeding complications.  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  The patient specifically denies any chest pain at rest exertion, dyspnea at rest or with exertion, orthopnea, paroxysmal nocturnal dyspnea, syncope, palpitations, focal neurological deficits, intermittent claudication, lower extremity edema, unexplained weight gain, cough, hemoptysis or wheezing.  Unable to completely wean furosemide due to recurrent edema.  She has normal left ventricular systolic function and mild biatrial dilation and mild to moderate pulmonary artery hypertension by echo. She has evidence of coronary artery calcification, but has not undergone angiography or nuclear stress testing.  She has never had angina pectoris.  She is not receiving aspirin due to full anticoagulation with Eliquis.  She takes pravastatin for hyperlipidemia.  Past Medical History:  Diagnosis Date  . Breast cancer ([place]) 07-13-1997    right  . Chronic back pain   . CKD (chronic kidney disease), stage III ([place])   . Complication of anesthesia   . Coronary artery calcification of native artery    a. seen on PET scan 04/2016.  [name] Hypertension   . Pericardial effusion    a. small by TEE 05/2016.  [name] Persistent atrial fibrillation ([place])   . Pleural effusion 11-08-2008  . PONV (postoperative nausea and vomiting)   . Radiation 08/17/2015-08/29/2015   lower thoracic spine 22.5 gray  . Radiation 01/17/16-01/24/16   right femur 20Gy    Past Surgical History:  Procedure Laterality Date  . ABDOMINAL HYSTERECTOMY  02-21-1982  . BREAST BIOPSY  07-13-1997  . CARDIOVERSION N/A 05/27/2016   Procedure: CARDIOVERSION;  Surgeon: [name], MD;  Location: [facility];  Service: Cardiovascular;  Laterality: N/A;  . DILATION AND CURETTAGE, DIAGNOSTIC / THERAPEUTIC  12-13-1981  . MASTECTOMY  07-29-1997  . PELVIC LAPAROSCOPY  11-06-1981  . POSTERIOR LUMBAR FUSION 4 LEVEL N/A 09/01/2015   Procedure: Thoracic six-Lumbar one fusion with arthrodesis pedicle screw stabilization and decompression;  Surgeon: Ashok Pall, MD;  Location: [facility];  Service: Neurosurgery;  Laterality: N/A;  T6-L1 fusion with arthrodesis pedicle screw stabilization and decompression  . TEE WITHOUT CARDIOVERSION N/A 05/27/2016   Procedure: TRANSESOPHAGEAL ECHOCARDIOGRAM (TEE);  Surgeon: [name], MD;  Location[facility] END[facility]COPY;  Service: Cardiovascular;  Laterality: N/A;  . THORACENTESIS  10-18-2008    Current Medications: Outpatient Medications Prior to Visit  Medication Sig Dispense Refill  . acetaminophen (TYLENOL) 500 MG tablet Take 500 mg by mouth every 6 (six) hours as needed for moderate pain.    [name] ELIQUIS 5 MG TABS tablet TAKE 1 TABLET (5  MG TOTAL) BY MOUTH 2 (TWO) TIMES DAILY. 180 tablet 1  . fluticasone (FLONASE) 50 MCG/ACT nasal spray Place 1 spray 2 (two) times daily as needed into both nostrils for allergies or rhinitis.    . furosemide (LASIX) 20 MG  tablet Take 1 tablet (20 mg total) 3 (three) times a week by mouth. Mondays, Wednesdays, and Fridays 45 tablet 3  . letrozole (FEMARA) 2.5 MG tablet TAKE 1 TABLET (2.5 MG TOTAL) BY MOUTH DAILY. 90 tablet 3  . loratadine (CLARITIN) 10 MG tablet Take 10 mg by mouth daily.     [name] losartan (COZAAR) 50 MG tablet Take 100 mg daily by mouth.  2  . palbociclib (IBRANCE) 100 MG capsule TAKE 1 CAPSULE BY MOUTH ONCE DAILY WITH BREAKFAST. TAKE WHOLE WITH FOOD (Patient taking differently: TAKE 1 CAPSULE BY MOUTH ONCE DAILY WITH BREAKFAST. TAKE WHOLE WITH FOOD. Take for 3 weeks on; 1 week off--alternating.) 21 capsule 3  . Potassium 99 MG TABS Take 2 tablets 3 (three) times a week by mouth. Mondays, Wednesdays, and Fridays    . pravastatin (PRAVACHOL) 40 MG tablet Take 1 tablet (40 mg total) by mouth every evening. 90 tablet 3  . vitamin B-12 (CYANOCOBALAMIN) 1000 MCG tablet Take 1,000 mcg by mouth 2 (two) times daily.    . vitamin C (ASCORBIC ACID) 500 MG tablet Take 500 mg by mouth daily.      . vitamin E 200 UNIT capsule Take 200 Units by mouth daily.       No facility-administered medications prior to visit.      Allergies:   Diphenhydramine hcl; Codeine sulfate; Metoprolol; Oseltamivir phosphate; Oxycodone-acetaminophen; Prednisone; and Sudafed [pseudoephedrine hcl]   Social History   Socioeconomic History  . Marital status: Single    Spouse name: Not on file  . Number of children: 0  . Years of education: Not on file  . Highest education level: Not on file  Occupational History  . Occupation: Firefighter for [employer]  . Financial resource strain: Not on file  . Food insecurity:    Worry: Not on file    Inability: Not on file  . Transportation needs:    Medical: Not on file    Non-medical: Not on file  Tobacco Use  . Smoking status: Former Smoker    Packs/day: 0.50    Years: 30.00    Pack years: 15.00    Types: Cigarettes    Last attempt to quit:  07/22/1996    Years since quitting: 21.3  . Smokeless tobacco: Never Used  . Tobacco comment: 1/2 up to 1 ppd  Substance and Sexual Activity  . Alcohol use: No    Alcohol/week: 0.0 oz    Comment: hx of alcohol socially - hasn't had drink since March 1998  . Drug use: No  . Sexual activity: Not on file  Lifestyle  . Physical activity:    Days per week: Not on file    Minutes per session: Not on file  . Stress: Not on file  Relationships  . Social connections:    Talks on phone: Not on file    Gets together: Not on file    Attends religious service: Not on file    Active member of club or organization: Not on file    Attends meetings of clubs or organizations: Not on file    Relationship status: Not on file  Other Topics Concern  . Not on file  Social History Narrative  .  Not on file     Family History:  The patient's family history includes Aortic stenosis in her maternal uncle; Asthma in her father; Breast cancer in her cousin; Breast cancer (age of onset: 59) in her cousin; Diabetes in her paternal grandmother; Heart attack (age of onset: 82) in her maternal grandfather; Heart disease in her mother; Prostate cancer in her maternal uncle, maternal uncle, and maternal uncle; Prostate cancer (age of onset: 15) in her maternal uncle.   ROS:   Please see the history of present illness.    ROS All other systems reviewed and are negative.   PHYSICAL EXAM:   VS:  BP 136/88   Pulse 69   Ht 5\' 2"  (1.575 m)   Wt 167 lb (75.8 kg)   BMI 30.54 kg/m      General: Alert, oriented x3, no distress, borderline obese Head: no evidence of trauma, PERRL, EOMI, no exophtalmos or lid lag, no myxedema, no xanthelasma; normal ears, nose and oropharynx Neck: normal jugular venous pulsations and no hepatojugular reflux; brisk carotid pulses without delay and no carotid bruits Chest: clear to auscultation, no signs of consolidation by percussion or palpation, normal fremitus, symmetrical and full  respiratory excursions Cardiovascular: normal position and quality of the apical impulse, regular rhythm, normal first and second heart sounds, no murmurs, rubs or gallops Abdomen: no tenderness or distention, no masses by palpation, no abnormal pulsatility or arterial bruits, normal bowel sounds, no hepatosplenomegaly Extremities: no clubbing, cyanosis or edema; 2+ radial, ulnar and brachial pulses bilaterally; 2+ right femoral, posterior tibial and dorsalis pedis pulses; 2+ left femoral, posterior tibial and dorsalis pedis pulses; no subclavian or femoral bruits Neurological: grossly nonfocal Psych: Normal mood and affect    Wt Readings from Last 3 Encounters:  12/01/17 167 lb (75.8 kg)  09/23/17 170 lb 1.6 oz (77.2 kg)  06/24/17 166 lb 6.4 oz (75.5 kg)      Studies/Labs Reviewed:   EKG:  EKG is ordered today.  It shows normal sinus rhythm with single PAC and borderline voltage for LVH, QTC 469 ms  Recent Labs: 05/29/2017: TSH 4.080 09/23/2017: ALT 18; BUN 29; Creatinine, Ser 1.08; Hemoglobin 12.8; Platelets 200; Potassium 4.1; Sodium 139   Lipid Panel    Component Value Date/Time   CHOL 134 05/23/2016 0304   CHOL 133 11/09/2015 1104   TRIG 247 (H) 05/23/2016 0304   TRIG 238 (H) 11/09/2015 1104   HDL 31 (L) 05/23/2016 0304   HDL 43 11/09/2015 1104   CHOLHDL 4.3 05/23/2016 0304   VLDL 49 (H) 05/23/2016 0304   LDLCALC 54 05/23/2016 0304   LDLCALC 42 11/09/2015 1104   LDLDIRECT 75.4 06/27/2008 0931    Additional studies/ records that were reviewed today include:  Records from recent visits in Oncology clinic   ASSESSMENT:    1. Chronic diastolic heart failure (HCC)   2. Paroxysmal atrial fibrillation ([place])   3. Long term (current) use of anticoagulants   4. History of pericarditis   5. Essential hypertension   6. Hypercholesterolemia      PLAN:  In order of problems listed above:  1. CHF: Unable to completely wean diuretic due to lower extremity edema, but  otherwise very well compensated.  Class I-2 2. AFib: Successfully staying in sinus rhythm without amiodarone for 6 months. CHADSVasc 3 (age, gender, HTN), on Eliquis.  When in atrial fibrillation she was very difficult to rate control and I would favor early cardioversion if the arrhythmia recurs. 3. Eliquis: Occasional  minor bleeding problems, no serious events.  Continue full anticoagulation 4. Pericardial effusion: Small pericardial effusion when she initially presented with atrial fibrillation.  Follow-up echo in May 2018 showed resolution of effusion.  This makes it very unlikely that it was neoplastic.  However, she does have documented pleural involvement with breast cancer.  Currently no clinical signs to suggest pericardial effusion or tamponade. 5. HTN: fair control on losartan. 6. HLP: on statin     Medication Adjustments/Labs and Tests Ordered: Current medicines are reviewed at length with the patient today.  Concerns regarding medicines are outlined above.  Medication changes, Labs and Tests ordered today are listed in the Patient Instructions below. Patient Instructions  Medication Instructions: Your physician recommends that you continue on your current medications as directed. Please refer to the Current Medication list given to you today.  Follow-Up: Your physician wants you to follow-up in: 1 year with Dr. Sallyanne Kuster. You will receive a reminder letter in the mail two months in advance. If you don't receive a letter, please call our office to schedule the follow-up appointment.  If you need a refill on your cardiac medications before your next appointment, please call your pharmacy.     Signed, [name], MD  12/01/2017 1:59 PM    [facility] [address], [place], [place]  [address] Phone: [phone]; Fax: [phone]

## 2017-12-01 NOTE — Patient Instructions (Signed)
Medication Instructions: Your physician recommends that you continue on your current medications as directed. Please refer to the Current Medication list given to you today.   Follow-Up: Your physician wants you to follow-up in: 1 year with Dr. Croitoru. You will receive a reminder letter in the mail two months in advance. If you don't receive a letter, please call our office to schedule the follow-up appointment.   If you need a refill on your cardiac medications before your next appointment, please call your pharmacy.  

## 2017-12-18 MED FILL — LETROZOLE 2.5 MG TABLET: 2.5 | 90 days supply | Qty: 90 | Fill #1

## 2017-12-24 ENCOUNTER — Ambulatory Visit (HOSPITAL_COMMUNITY): Payer: Medicare Other

## 2017-12-24 ENCOUNTER — Inpatient Hospital Stay: Payer: Medicare Other | Attending: Hematology and Oncology

## 2017-12-24 DIAGNOSIS — C782 Secondary malignant neoplasm of pleura: Secondary | ICD-10-CM | POA: Insufficient documentation

## 2017-12-24 DIAGNOSIS — C7802 Secondary malignant neoplasm of left lung: Secondary | ICD-10-CM | POA: Insufficient documentation

## 2017-12-24 DIAGNOSIS — C50911 Malignant neoplasm of unspecified site of right female breast: Secondary | ICD-10-CM | POA: Insufficient documentation

## 2017-12-24 DIAGNOSIS — N183 Chronic kidney disease, stage 3 (moderate): Secondary | ICD-10-CM | POA: Insufficient documentation

## 2017-12-24 DIAGNOSIS — I4891 Unspecified atrial fibrillation: Secondary | ICD-10-CM | POA: Insufficient documentation

## 2017-12-24 DIAGNOSIS — D701 Agranulocytosis secondary to cancer chemotherapy: Secondary | ICD-10-CM | POA: Insufficient documentation

## 2017-12-24 DIAGNOSIS — C7951 Secondary malignant neoplasm of bone: Secondary | ICD-10-CM | POA: Insufficient documentation

## 2017-12-24 DIAGNOSIS — C7801 Secondary malignant neoplasm of right lung: Secondary | ICD-10-CM | POA: Insufficient documentation

## 2017-12-25 ENCOUNTER — Inpatient Hospital Stay: Payer: Medicare Other | Admitting: Hematology and Oncology

## 2017-12-25 ENCOUNTER — Telehealth: Payer: Self-pay

## 2017-12-25 NOTE — Telephone Encounter (Signed)
Left VM for patient regarding missed appt today.  Cyndia Bent RN

## 2017-12-26 MED FILL — IBRANCE 100 MG CAPSULE: 100 | 28 days supply | Qty: 21 | Fill #3

## 2017-12-29 ENCOUNTER — Inpatient Hospital Stay: Payer: Medicare Other

## 2017-12-29 ENCOUNTER — Telehealth: Payer: Self-pay | Admitting: Hematology and Oncology

## 2017-12-29 DIAGNOSIS — C782 Secondary malignant neoplasm of pleura: Secondary | ICD-10-CM | POA: Diagnosis not present

## 2017-12-29 DIAGNOSIS — C7801 Secondary malignant neoplasm of right lung: Secondary | ICD-10-CM | POA: Diagnosis not present

## 2017-12-29 DIAGNOSIS — C7802 Secondary malignant neoplasm of left lung: Secondary | ICD-10-CM | POA: Diagnosis not present

## 2017-12-29 DIAGNOSIS — N183 Chronic kidney disease, stage 3 (moderate): Secondary | ICD-10-CM | POA: Diagnosis not present

## 2017-12-29 DIAGNOSIS — D701 Agranulocytosis secondary to cancer chemotherapy: Secondary | ICD-10-CM | POA: Diagnosis not present

## 2017-12-29 DIAGNOSIS — C50911 Malignant neoplasm of unspecified site of right female breast: Secondary | ICD-10-CM

## 2017-12-29 DIAGNOSIS — I4891 Unspecified atrial fibrillation: Secondary | ICD-10-CM | POA: Diagnosis not present

## 2017-12-29 DIAGNOSIS — C7951 Secondary malignant neoplasm of bone: Secondary | ICD-10-CM | POA: Diagnosis present

## 2017-12-29 LAB — CBC WITH DIFFERENTIAL (CANCER CENTER ONLY)
BASOS ABS: 0 10*3/uL (ref 0.0–0.1)
BASOS PCT: 1 %
EOS ABS: 0.1 10*3/uL (ref 0.0–0.5)
EOS PCT: 3 %
HCT: 37 % (ref 34.8–46.6)
Hemoglobin: 12.6 g/dL (ref 11.6–15.9)
LYMPHS PCT: 20 %
Lymphs Abs: 0.4 10*3/uL — ABNORMAL LOW (ref 0.9–3.3)
MCH: 34.8 pg — ABNORMAL HIGH (ref 25.1–34.0)
MCHC: 34.1 g/dL (ref 31.5–36.0)
MCV: 102.1 fL — AB (ref 79.5–101.0)
MONO ABS: 0.2 10*3/uL (ref 0.1–0.9)
Monocytes Relative: 10 %
Neutro Abs: 1.3 10*3/uL — ABNORMAL LOW (ref 1.5–6.5)
Neutrophils Relative %: 66 %
PLATELETS: 188 10*3/uL (ref 145–400)
RBC: 3.62 MIL/uL — AB (ref 3.70–5.45)
RDW: 14.7 % — ABNORMAL HIGH (ref 11.2–14.5)
WBC: 2 10*3/uL — AB (ref 3.9–10.3)

## 2017-12-29 LAB — CMP (CANCER CENTER ONLY)
ALBUMIN: 4.1 g/dL (ref 3.5–5.0)
ALK PHOS: 70 U/L (ref 40–150)
ALT: 13 U/L (ref 0–55)
AST: 24 U/L (ref 5–34)
Anion gap: 9 (ref 3–11)
BILIRUBIN TOTAL: 1 mg/dL (ref 0.2–1.2)
BUN: 16 mg/dL (ref 7–26)
CALCIUM: 9 mg/dL (ref 8.4–10.4)
CO2: 23 mmol/L (ref 22–29)
CREATININE: 1 mg/dL (ref 0.60–1.10)
Chloride: 108 mmol/L (ref 98–109)
GFR, EST NON AFRICAN AMERICAN: 54 mL/min — AB (ref >60)
GFR, Est AFR Am: >60 mL/min (ref >60)
GLUCOSE: 89 mg/dL (ref 70–140)
Potassium: 3.7 mmol/L (ref 3.5–5.1)
Sodium: 140 mmol/L (ref 136–145)
TOTAL PROTEIN: 7.2 g/dL (ref 6.4–8.3)

## 2017-12-29 NOTE — Telephone Encounter (Signed)
Patient requested tp r/s missed appointments.

## 2018-01-01 ENCOUNTER — Ambulatory Visit (HOSPITAL_COMMUNITY)
Admission: RE | Admit: 2018-01-01 | Discharge: 2018-01-01 | Disposition: A | Discharge status: Home / Self Care | Payer: Medicare Other | Source: Ambulatory Visit | Attending: Hematology and Oncology | Admitting: Hematology and Oncology

## 2018-01-01 DIAGNOSIS — C50911 Malignant neoplasm of unspecified site of right female breast: Secondary | ICD-10-CM | POA: Diagnosis present

## 2018-01-01 DIAGNOSIS — C782 Secondary malignant neoplasm of pleura: Secondary | ICD-10-CM | POA: Insufficient documentation

## 2018-01-01 DIAGNOSIS — I700 Atherosclerosis of aorta: Secondary | ICD-10-CM | POA: Diagnosis not present

## 2018-01-01 DIAGNOSIS — C7951 Secondary malignant neoplasm of bone: Secondary | ICD-10-CM | POA: Diagnosis not present

## 2018-01-01 DIAGNOSIS — J90 Pleural effusion, not elsewhere classified: Secondary | ICD-10-CM | POA: Insufficient documentation

## 2018-01-01 DIAGNOSIS — I2510 Atherosclerotic heart disease of native coronary artery without angina pectoris: Secondary | ICD-10-CM | POA: Insufficient documentation

## 2018-01-01 MED ORDER — IOPAMIDOL (ISOVUE-300) INJECTION 61%
INTRAVENOUS | Status: AC
  Filled 2018-01-01: qty 100

## 2018-01-01 MED ORDER — IOPAMIDOL (ISOVUE-300) INJECTION 61%
100.0000 mL | Freq: Once | INTRAVENOUS | Status: AC | PRN
  Administered 2018-01-01: 100 mL via INTRAVENOUS

## 2018-01-05 ENCOUNTER — Inpatient Hospital Stay: Payer: Medicare Other | Admitting: Hematology and Oncology

## 2018-01-05 ENCOUNTER — Telehealth: Payer: Self-pay | Admitting: Hematology and Oncology

## 2018-01-05 VITALS — BP 126/91 | HR 90 | Temp 98.7°F | Resp 16 | Ht 62.0 in | Wt 167.8 lb

## 2018-01-05 DIAGNOSIS — D701 Agranulocytosis secondary to cancer chemotherapy: Secondary | ICD-10-CM | POA: Diagnosis not present

## 2018-01-05 DIAGNOSIS — C7802 Secondary malignant neoplasm of left lung: Secondary | ICD-10-CM

## 2018-01-05 DIAGNOSIS — C50919 Malignant neoplasm of unspecified site of unspecified female breast: Secondary | ICD-10-CM

## 2018-01-05 DIAGNOSIS — C7951 Secondary malignant neoplasm of bone: Secondary | ICD-10-CM

## 2018-01-05 DIAGNOSIS — N183 Chronic kidney disease, stage 3 (moderate): Secondary | ICD-10-CM

## 2018-01-05 DIAGNOSIS — C50911 Malignant neoplasm of unspecified site of right female breast: Secondary | ICD-10-CM | POA: Diagnosis not present

## 2018-01-05 DIAGNOSIS — C7801 Secondary malignant neoplasm of right lung: Secondary | ICD-10-CM

## 2018-01-05 DIAGNOSIS — T451X5A Adverse effect of antineoplastic and immunosuppressive drugs, initial encounter: Secondary | ICD-10-CM

## 2018-01-05 DIAGNOSIS — I4891 Unspecified atrial fibrillation: Secondary | ICD-10-CM

## 2018-01-05 DIAGNOSIS — C782 Secondary malignant neoplasm of pleura: Secondary | ICD-10-CM

## 2018-01-05 MED ORDER — DENOSUMAB 120 MG/1.7ML ~~LOC~~ SOLN
120.0000 mg | Freq: Once | SUBCUTANEOUS | Status: AC
  Administered 2018-01-05: 120 mg via SUBCUTANEOUS

## 2018-01-05 MED ORDER — DENOSUMAB 120 MG/1.7ML ~~LOC~~ SOLN
SUBCUTANEOUS | Status: AC
Start: 2018-01-05 — End: 2018-01-05
  Filled 2018-01-05: qty 1.7

## 2018-01-05 NOTE — Patient Instructions (Signed)

## 2018-01-05 NOTE — Telephone Encounter (Signed)
Gave patient avs and calendar of upcoming June appointments.  °

## 2018-01-05 NOTE — Assessment & Plan Note (Addendum)
Metastatic breast cancer tolungs and bone: Clinically improving onweekly taxol beginning 05-09-16, several delays including for counts, rapid A fib, respiratory infection etc  Treatment summary: Taxol days 1 and 8 every 3 weeks with Neupogen along with Xgeva; completed 4cycles from 05/09/2016-08/29/2016 Taxol toxicities: Neutropenia: Requiring Neupogen with each treatment  Atrial fibrillation: Recurrent with RVR, cardioverted 07/25/2016, amiodarone, metoprolol and Lasix for CHF (Dr. Sallyanne Kuster) CKD stage III Pathologic compression fracture T10 status post radiation and laminectomy T6-L1 09/01/2015, radiation to right iliac area 05/2016  Rebiopsy of the lung nodule 09/19/2016: Metastatic breast cancer that is HER-2 negative,  01/01/2018 -------------------------------------------------------------------------------------------------------------------------------------------- Current treatment: LetrozolewithPalbociclib started 10/01/2016  Palbociclib toxicities: 1. Neutropenia: Currently Palbociclib dose is 100 mg daily. Bone metastases: She will receive Xgeva today and will receive it every 3 months.  CT chest abdomen pelvis 01/01/2018: 3 new hypodense lesions in the liver 1.5 cm and 0.7 cm concerning for possible new metastatic lesions although nonspecific, stable bone metastases.  Stable pleural lesions.  I discussed with Dr. Shon Baton with interventional radiology.  He recommended that we get an MRI of the liver.  If the MRI liver shows that these are not hemangiomas then we will get a biopsy of the liver.  Return to clinic after the biopsy for follow-up.

## 2018-01-05 NOTE — Progress Notes (Signed)
Patient Care Team: [name], MD as PCP - General (Internal Medicine)  DIAGNOSIS:  Encounter Diagnosis  Name Primary?  . 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 ([place])     SUMMARY OF ONCOLOGIC HISTORY:   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 [facility])   1998 Initial Diagnosis    Right breast carcinoma T2N1 ER/PR positive at right mastectomy with node evaluation Dec 1998, treated with adjuvant adriamycin cytoxan followed by 5 years of tamoxifen      2010 Relapse/Recurrence    Metastatic breast cancer to the pleura      08/29/2008 - 08/30/2015 Anti-estrogen oral therapy    Aromasin followed by letrozole      07/2015 Relapse/Recurrence    Progression to bone required aggressive laminectomy February 2017      08/30/2015 - 09/13/2015 Radiation Therapy    Palliative radiation to the spine,  Right ilium ad right hip      01/05/2016 - 04/29/2016 Chemotherapy    Xeloda stopped when she had progression of dsease       05/02/2016 PET scan     innumerable hypermetabolic lesions throughout the axial and appendicular skeleton, increase in size and number of new lesions in skull, right scapular glenoid, throughout sacrum. Findings concerning for liver metastases, numerous pulmonary nodules bilaterally      05/09/2016 - 09/18/2016 Chemotherapy    Taxol weekly initially and later changed her day 1 day 8 every 3 weeks (complicated by A. fib with RVR and prolonged respiratory infection) along with Xgeva       09/19/2016 Procedure    Biopsy of lung mass: Metastatic carcinoma consistent with breast origin, HER-2 negative ratio 1.37      10/01/2016 -  Anti-estrogen oral therapy    Palbociclib and Letrozole      06/23/2017 Imaging    CT chest abdomen pelvis: No evidence of progression of metastatic disease, bone metastases stable right-sided pleural metastases and pleural effusions are stable       CHIEF COMPLIANT: Follow-up to review the results of CT chest abdomen  pelvis  INTERVAL HISTORY: Lisa [name] is a 77 year old with above-mentioned history metastatic breast cancer has been on palbociclib since March 2018.  Recent CT scans showed 3 new lesions in the liver which were concerning for metastatic disease although there were nonspecific.  She is here today to discuss the CT scans and to discuss additional treatment options.  Her mom passed away on January 17, 2018.  she missed her appointment because of that.  REVIEW OF SYSTEMS:   Constitutional: Denies fevers, chills or abnormal weight loss Eyes: Denies blurriness of vision Ears, nose, mouth, throat, and face: Denies mucositis or sore throat Respiratory: Denies cough, dyspnea or wheezes Cardiovascular: Denies palpitation, chest discomfort Gastrointestinal:  Denies nausea, heartburn or change in bowel habits Skin: Denies abnormal skin rashes Lymphatics: Denies new lymphadenopathy or easy bruising Neurological:Denies numbness, tingling or new weaknesses Behavioral/Psych: Mood is stable, no new changes  Extremities: No lower extremity edema All other systems were reviewed with the patient and are negative.  I have reviewed the past medical history, past surgical history, social history and family history with the patient and they are unchanged from previous note.  ALLERGIES:  is allergic to diphenhydramine hcl; codeine sulfate; metoprolol; oseltamivir phosphate; oxycodone-acetaminophen; prednisone; and sudafed [pseudoephedrine hcl].  MEDICATIONS:  Current Outpatient Medications  Medication Sig Dispense Refill  . acetaminophen (TYLENOL) 500 MG tablet Take 500 mg by mouth every 6 (six) hours as needed for moderate pain.    [name]  ELIQUIS 5 MG TABS tablet TAKE 1 TABLET (5 MG TOTAL) BY MOUTH 2 (TWO) TIMES DAILY. 180 tablet 1  . furosemide (LASIX) 20 MG tablet Take 1 tablet (20 mg total) 3 (three) times a week by mouth. Mondays, Wednesdays, and Fridays 45 tablet 3  . letrozole (FEMARA) 2.5 MG tablet TAKE 1  TABLET (2.5 MG TOTAL) BY MOUTH DAILY. 90 tablet 3  . loratadine (CLARITIN) 10 MG tablet Take 10 mg by mouth daily.     [name] losartan (COZAAR) 50 MG tablet Take 100 mg daily by mouth.  2  . palbociclib (IBRANCE) 100 MG capsule TAKE 1 CAPSULE BY MOUTH ONCE DAILY WITH BREAKFAST. TAKE WHOLE WITH FOOD (Patient taking differently: TAKE 1 CAPSULE BY MOUTH ONCE DAILY WITH BREAKFAST. TAKE WHOLE WITH FOOD. Take for 3 weeks on; 1 week off--alternating.) 21 capsule 3  . Potassium 99 MG TABS Take 2 tablets 3 (three) times a week by mouth. Mondays, Wednesdays, and Fridays    . pravastatin (PRAVACHOL) 40 MG tablet Take 1 tablet (40 mg total) by mouth every evening. 90 tablet 3  . vitamin B-12 (CYANOCOBALAMIN) 1000 MCG tablet Take 1,000 mcg by mouth 2 (two) times daily.    . vitamin C (ASCORBIC ACID) 500 MG tablet Take 500 mg by mouth daily.      . vitamin E 200 UNIT capsule Take 200 Units by mouth daily.       No current facility-administered medications for this visit.     PHYSICAL EXAMINATION: ECOG PERFORMANCE STATUS: 1 - Symptomatic but completely ambulatory  Vitals:   01/05/18 1116  BP: (!) 126/91  Pulse: 90  Resp: 16  Temp: 98.7 F (37.1 C)  SpO2: 99%   Filed Weights   01/05/18 1116  Weight: 167 lb 12.8 oz (76.1 kg)    GENERAL:alert, no distress and comfortable SKIN: skin color, texture, turgor are normal, no rashes or significant lesions EYES: normal, Conjunctiva are pink and non-injected, sclera clear OROPHARYNX:no exudate, no erythema and lips, buccal mucosa, and tongue normal  NECK: supple, thyroid normal size, non-tender, without nodularity LYMPH:  no palpable lymphadenopathy in the cervical, axillary or inguinal LUNGS: clear to auscultation and percussion with normal breathing effort HEART: regular rate & rhythm and no murmurs and no lower extremity edema ABDOMEN:abdomen soft, non-tender and normal bowel sounds MUSCULOSKELETAL:no cyanosis of digits and no clubbing  NEURO: alert &  oriented x 3 with fluent speech, no focal motor/sensory deficits EXTREMITIES: No lower extremity edema  LABORATORY DATA:  I have reviewed the data as listed CMP Latest Ref Rng & Units 12/29/2017 09/23/2017 06/23/2017  Glucose 70 - 140 mg/dL 89 95 82  BUN 7 - 26 mg/dL 16 29(H) 22.2  Creatinine 0.60 - 1.10 mg/dL 1.00 1.08 1.0  Sodium 136 - 145 mmol/L 140 139 139  Potassium 3.5 - 5.1 mmol/L 3.7 4.1 3.9  Chloride 98 - 109 mmol/L 108 107 -  CO2 22 - 29 mmol/L 23 22 21(L)  Calcium 8.4 - 10.4 mg/dL 9.0 10.0 9.1  Total Protein 6.4 - 8.3 g/dL 7.2 7.1 7.1  Total Bilirubin 0.2 - 1.2 mg/dL 1.0 0.7 0.69  Alkaline Phos 40 - 150 U/L 70 67 74  AST 5 - 34 U/L 24 26 22   ALT 0 - 55 U/L 13 18 13     Lab Results  Component Value Date   WBC 2.0 (L) 12/29/2017   HGB 12.6 12/29/2017   HCT 37.0 12/29/2017   MCV 102.1 (H) 12/29/2017   PLT 188 12/29/2017  NEUTROABS 1.3 (L) 12/29/2017    ASSESSMENT & PLAN:  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 [facility]) Metastatic breast cancer tolungs and bone: Clinically improving onweekly taxol beginning 05-09-16, several delays including for counts, rapid A fib, respiratory infection etc  Treatment summary: Taxol days 1 and 8 every 3 weeks with Neupogen along with Xgeva; completed 4cycles from 05/09/2016-08/29/2016 Taxol toxicities: Neutropenia: Requiring Neupogen with each treatment  Atrial fibrillation: Recurrent with RVR, cardioverted 07/25/2016, amiodarone, metoprolol and Lasix for CHF (Dr. Sallyanne Kuster) CKD stage III Pathologic compression fracture T10 status post radiation and laminectomy T6-L1 09/01/2015, radiation to right iliac area 05/2016  Rebiopsy of the lung nodule 09/19/2016: Metastatic breast cancer that is HER-2 negative,  01/01/2018 -------------------------------------------------------------------------------------------------------------------------------------------- Current treatment: LetrozolewithPalbociclib started  10/01/2016  Palbociclib toxicities: 1. Neutropenia: Currently Palbociclib dose is 100 mg daily. Bone metastases: She will receive Xgeva today and will receive it every 3 months.  CT chest abdomen pelvis 01/01/2018: 3 new hypodense lesions in the liver 1.5 cm and 0.7 cm concerning for possible new metastatic lesions although nonspecific, stable bone metastases.  Stable pleural lesions.  I discussed with Dr. Shon Baton with interventional radiology.  He recommended that we get an MRI of the liver.  If the MRI liver shows that these are not hemangiomas then we will get a biopsy of the liver.  Return to clinic after the biopsy for follow-up.     Orders Placed This Encounter  Procedures  . MR LIVER W WO CONTRAST    Standing Status:   Future    Standing Expiration Date:   03/08/2019    Order Specific Question:   If indicated for the ordered procedure, I authorize the administration of contrast media per Radiology protocol    Answer:   Yes    Order Specific Question:   What is the patient's sedation requirement?    Answer:   No Sedation    Order Specific Question:   Does the patient have a pacemaker or implanted devices?    Answer:   No    Order Specific Question:   Radiology Contrast Protocol - do NOT remove file path    Answer:   [email or web address]    Order Specific Question:   Reason for Exam additional comments    Answer:   Liver lesions with metastatic breast cancer hemagioma vs Mets    Order Specific Question:   Preferred imaging location?    Answer:   [facility] (table limit-350 lbs)   The patient has a good understanding of the overall plan. she agrees with it. she will call with any problems that may develop before the next visit here.   Harriette Ohara, MD 01/05/18

## 2018-01-05 NOTE — Addendum Note (Signed)
Addended by: Thelma Barge MAY J on: 01/05/2018 12:06 PM   Modules accepted: Orders

## 2018-01-07 ENCOUNTER — Other Ambulatory Visit: Payer: Self-pay | Admitting: Hematology and Oncology

## 2018-01-07 ENCOUNTER — Ambulatory Visit (HOSPITAL_COMMUNITY)
Admission: RE | Admit: 2018-01-07 | Discharge: 2018-01-07 | Disposition: A | Discharge status: Home / Self Care | Payer: Medicare Other | Source: Ambulatory Visit | Attending: Hematology and Oncology | Admitting: Hematology and Oncology

## 2018-01-07 DIAGNOSIS — C787 Secondary malignant neoplasm of liver and intrahepatic bile duct: Secondary | ICD-10-CM | POA: Diagnosis not present

## 2018-01-07 DIAGNOSIS — C50911 Malignant neoplasm of unspecified site of right female breast: Secondary | ICD-10-CM | POA: Insufficient documentation

## 2018-01-07 DIAGNOSIS — J90 Pleural effusion, not elsewhere classified: Secondary | ICD-10-CM | POA: Diagnosis not present

## 2018-01-07 DIAGNOSIS — C7951 Secondary malignant neoplasm of bone: Principal | ICD-10-CM

## 2018-01-07 DIAGNOSIS — C782 Secondary malignant neoplasm of pleura: Secondary | ICD-10-CM | POA: Diagnosis present

## 2018-01-07 MED ORDER — GADOBENATE DIMEGLUMINE 529 MG/ML IV SOLN
20.0000 mL | Freq: Once | INTRAVENOUS | Status: AC | PRN
  Administered 2018-01-07: 16 mL via INTRAVENOUS

## 2018-01-08 ENCOUNTER — Encounter: Payer: Self-pay | Admitting: Cardiovascular Disease

## 2018-01-14 ENCOUNTER — Other Ambulatory Visit: Payer: Self-pay | Admitting: Student

## 2018-01-15 ENCOUNTER — Ambulatory Visit (HOSPITAL_COMMUNITY)
Admission: RE | Admit: 2018-01-15 | Discharge: 2018-01-15 | Disposition: A | Discharge status: Home / Self Care | Payer: Medicare Other | Source: Ambulatory Visit | Attending: Hematology and Oncology | Admitting: Hematology and Oncology

## 2018-01-15 ENCOUNTER — Encounter: Payer: Self-pay | Admitting: Hematology and Oncology

## 2018-01-15 ENCOUNTER — Encounter (HOSPITAL_COMMUNITY): Payer: Self-pay

## 2018-01-15 DIAGNOSIS — M549 Dorsalgia, unspecified: Secondary | ICD-10-CM | POA: Insufficient documentation

## 2018-01-15 DIAGNOSIS — C50911 Malignant neoplasm of unspecified site of right female breast: Secondary | ICD-10-CM

## 2018-01-15 DIAGNOSIS — C787 Secondary malignant neoplasm of liver and intrahepatic bile duct: Secondary | ICD-10-CM | POA: Insufficient documentation

## 2018-01-15 DIAGNOSIS — Z9010 Acquired absence of unspecified breast and nipple: Secondary | ICD-10-CM | POA: Insufficient documentation

## 2018-01-15 DIAGNOSIS — Z888 Allergy status to other drugs, medicaments and biological substances status: Secondary | ICD-10-CM | POA: Diagnosis not present

## 2018-01-15 DIAGNOSIS — I129 Hypertensive chronic kidney disease with stage 1 through stage 4 chronic kidney disease, or unspecified chronic kidney disease: Secondary | ICD-10-CM | POA: Insufficient documentation

## 2018-01-15 DIAGNOSIS — I481 Persistent atrial fibrillation: Secondary | ICD-10-CM | POA: Insufficient documentation

## 2018-01-15 DIAGNOSIS — Z79811 Long term (current) use of aromatase inhibitors: Secondary | ICD-10-CM | POA: Insufficient documentation

## 2018-01-15 DIAGNOSIS — Z08 Encounter for follow-up examination after completed treatment for malignant neoplasm: Secondary | ICD-10-CM | POA: Insufficient documentation

## 2018-01-15 DIAGNOSIS — N183 Chronic kidney disease, stage 3 (moderate): Secondary | ICD-10-CM | POA: Insufficient documentation

## 2018-01-15 DIAGNOSIS — Z7901 Long term (current) use of anticoagulants: Secondary | ICD-10-CM | POA: Diagnosis not present

## 2018-01-15 DIAGNOSIS — Z79899 Other long term (current) drug therapy: Secondary | ICD-10-CM | POA: Insufficient documentation

## 2018-01-15 DIAGNOSIS — C7951 Secondary malignant neoplasm of bone: Secondary | ICD-10-CM | POA: Insufficient documentation

## 2018-01-15 DIAGNOSIS — Z9889 Other specified postprocedural states: Secondary | ICD-10-CM | POA: Insufficient documentation

## 2018-01-15 DIAGNOSIS — G8929 Other chronic pain: Secondary | ICD-10-CM | POA: Diagnosis not present

## 2018-01-15 LAB — CBC
HEMATOCRIT: 36.9 % (ref 36.0–46.0)
HEMOGLOBIN: 12.6 g/dL (ref 12.0–15.0)
MCH: 34.5 pg — ABNORMAL HIGH (ref 26.0–34.0)
MCHC: 34.1 g/dL (ref 30.0–36.0)
MCV: 101.1 fL — ABNORMAL HIGH (ref 78.0–100.0)
Platelets: 231 10*3/uL (ref 150–400)
RBC: 3.65 MIL/uL — ABNORMAL LOW (ref 3.87–5.11)
RDW: 14.2 % (ref 11.5–15.5)
WBC: 2.9 10*3/uL — ABNORMAL LOW (ref 4.0–10.5)

## 2018-01-15 LAB — APTT: APTT: 32 s (ref 24–36)

## 2018-01-15 LAB — PROTIME-INR
INR: 0.95
PROTHROMBIN TIME: 12.6 s (ref 11.4–15.2)

## 2018-01-15 MED ORDER — ACETAMINOPHEN 325 MG PO TABS
650.0000 mg | ORAL_TABLET | Freq: Once | ORAL | Status: AC
  Administered 2018-01-15: 650 mg via ORAL
  Filled 2018-01-15: qty 2

## 2018-01-15 MED ORDER — ACETAMINOPHEN 325 MG PO TABS
ORAL_TABLET | ORAL | Status: AC
  Filled 2018-01-15: qty 2

## 2018-01-15 MED ORDER — GELATIN ABSORBABLE 12-7 MM EX MISC
CUTANEOUS | Status: AC
  Filled 2018-01-15: qty 1

## 2018-01-15 MED ORDER — SODIUM CHLORIDE 0.9 % IV SOLN
INTRAVENOUS | Status: DC
  Administered 2018-01-15: 11:00:00 via INTRAVENOUS

## 2018-01-15 MED ORDER — LIDOCAINE HCL (PF) 1 % IJ SOLN
INTRAMUSCULAR | Status: AC | PRN
  Administered 2018-01-15: 10 mL

## 2018-01-15 MED ORDER — FENTANYL CITRATE (PF) 100 MCG/2ML IJ SOLN
INTRAMUSCULAR | Status: AC
  Filled 2018-01-15: qty 2

## 2018-01-15 MED ORDER — MIDAZOLAM HCL 2 MG/2ML IJ SOLN
INTRAMUSCULAR | Status: AC | PRN
  Administered 2018-01-15 (×2): 1 mg via INTRAVENOUS

## 2018-01-15 MED ORDER — LIDOCAINE HCL 1 % IJ SOLN
INTRAMUSCULAR | Status: AC
  Filled 2018-01-15: qty 10

## 2018-01-15 MED ORDER — MIDAZOLAM HCL 2 MG/2ML IJ SOLN
INTRAMUSCULAR | Status: AC
  Filled 2018-01-15: qty 2

## 2018-01-15 MED ORDER — FENTANYL CITRATE (PF) 100 MCG/2ML IJ SOLN
INTRAMUSCULAR | Status: AC | PRN
  Administered 2018-01-15 (×2): 50 ug via INTRAVENOUS

## 2018-01-15 NOTE — Discharge Instructions (Signed)
Liver Biopsy, Care After °These instructions give you information on caring for yourself after your procedure. Your doctor may also give you more specific instructions. Call your doctor if you have any problems or questions after your procedure. °Follow these instructions at home: °· Rest at home for 1-2 days or as told by your doctor. °· Have someone stay with you for at least 24 hours. °· Do not do these things in the first 24 hours: °? Drive. °? Use machinery. °? Take care of other people. °? Sign legal documents. °? Take a bath or shower. °· There are many different ways to close and cover a cut (incision). For example, a cut can be closed with stitches, skin glue, or adhesive strips. Follow your doctor's instructions on: °? Taking care of your cut. °? Changing and removing your bandage (dressing). °? Removing whatever was used to close your cut. °· Do not drink alcohol in the first week. °· Do not lift more than 5 pounds or play contact sports for the first 2 weeks. °· Take medicines only as told by your doctor. For 1 week, do not take medicine that has aspirin in it or medicines like ibuprofen. °· Get your test results. °Contact a doctor if: °· A cut bleeds and leaves more than just a small spot of blood. °· A cut is red, puffs up (swells), or hurts more than before. °· Fluid or something else comes from a cut. °· A cut smells bad. °· You have a fever or chills. °Get help right away if: °· You have swelling, bloating, or pain in your belly (abdomen). °· You get dizzy or faint. °· You have a rash. °· You feel sick to your stomach (nauseous) or throw up (vomit). °· You have trouble breathing, feel short of breath, or feel faint. °· Your chest hurts. °· You have problems talking or seeing. °· You have trouble balancing or moving your arms or legs. °This information is not intended to replace advice given to you by your health care provider. Make sure you discuss any questions you have with your health care  provider. °Document Released: 04/16/2008 Document Revised: 12/14/2015 Document Reviewed: 09/03/2013 °Elsevier Interactive Patient Education © 2018 [employer] ° ° ° ° °Moderate Conscious Sedation, Adult, Care After °These instructions provide you with information about caring for yourself after your procedure. Your health care provider may also give you more specific instructions. Your treatment has been planned according to current medical practices, but problems sometimes occur. Call your health care provider if you have any problems or questions after your procedure. °What can I expect after the procedure? °After your procedure, it is common: °· To feel sleepy for several hours. °· To feel clumsy and have poor balance for several hours. °· To have poor judgment for several hours. °· To vomit if you eat too soon. ° °Follow these instructions at home: °For at least 24 hours after the procedure: ° °· Do not: °? Participate in activities where you could fall or become injured. °? Drive. °? Use heavy machinery. °? Drink alcohol. °? Take sleeping pills or medicines that cause drowsiness. °? Make important decisions or sign legal documents. °? Take care of children on your own. °· Rest. °Eating and drinking °· Follow the diet recommended by your health care provider. °· If you vomit: °? Drink water, juice, or soup when you can drink without vomiting. °? Make sure you have little or no nausea before eating solid foods. °General instructions °·   Have a responsible adult stay with you until you are awake and alert. °· Take over-the-counter and prescription medicines only as told by your health care provider. °· If you smoke, do not smoke without supervision. °· Keep all follow-up visits as told by your health care provider. This is important. °Contact a health care provider if: °· You keep feeling nauseous or you keep vomiting. °· You feel light-headed. °· You develop a rash. °· You have a fever. °Get help right away  if: °· You have trouble breathing. °This information is not intended to replace advice given to you by your health care provider. Make sure you discuss any questions you have with your health care provider. °Document Released: 04/28/2013 Document Revised: 12/11/2015 Document Reviewed: 10/28/2015 °Elsevier Interactive Patient Education © 2018 Elsevier Inc. ° ° °

## 2018-01-15 NOTE — H&P (Signed)
Referring Physician(s): Nicholas Lose  Supervising Physician: Jacqulynn Cadet  Patient Status:  [facility] OP  Chief Complaint:  "I'm having a liver biopsy"  Subjective: Patient familiar to IR service from prior right pleural mass biopsy on 09/19/2016.  She has a history of metastatic right breast carcinoma, initially diagnosed in 1998 with prior surgery and chemoradiation.  Recent follow-up imaging reveals new extensive bilateral hepatic metastases in addition to stable scattered sclerotic osseous metastatic disease, stable right pleural lesions.  She presents again today for image guided liver lesion biopsy for further evaluation.  She currently denies fever, headache, chest pain, dyspnea, cough, abdominal/back pain, nausea, vomiting or bleeding.  Past Medical History:  Diagnosis Date  . Breast cancer ([place]) 07-13-1997   right  . Chronic back pain   . CKD (chronic kidney disease), stage III ([place])   . Complication of anesthesia   . Coronary artery calcification of native artery    a. seen on PET scan 04/2016.  [name] Hypertension   . Pericardial effusion    a. small by TEE 05/2016.  [name] Persistent atrial fibrillation ([place])   . Pleural effusion 11-08-2008  . PONV (postoperative nausea and vomiting)   . Radiation 08/17/2015-08/29/2015   lower thoracic spine 22.5 gray  . Radiation 01/17/16-01/24/16   right femur 20Gy   Past Surgical History:  Procedure Laterality Date  . ABDOMINAL HYSTERECTOMY  02-21-1982  . BREAST BIOPSY  07-13-1997  . CARDIOVERSION N/A 05/27/2016   Procedure: CARDIOVERSION;  Surgeon: [name], MD;  Location: [place];  Service: Cardiovascular;  Laterality: N/A;  . DILATION AND CURETTAGE, DIAGNOSTIC / THERAPEUTIC  12-13-1981  . MASTECTOMY  07-29-1997  . PELVIC LAPAROSCOPY  11-06-1981  . POSTERIOR LUMBAR FUSION 4 LEVEL N/A 09/01/2015   Procedure: Thoracic six-Lumbar one fusion with arthrodesis pedicle screw stabilization and decompression;  Surgeon: Ashok Pall, MD;   Location: [place] NEURO ORS;  Service: Neurosurgery;  Laterality: N/A;  T6-L1 fusion with arthrodesis pedicle screw stabilization and decompression  . TEE WITHOUT CARDIOVERSION N/A 05/27/2016   Procedure: TRANSESOPHAGEAL ECHOCARDIOGRAM (TEE);  Surgeon: [name], MD;  Location: [facility] ENDOSCOPY;  Service: Cardiovascular;  Laterality: N/A;  . THORACENTESIS  10-18-2008    Allergies: Diphenhydramine hcl; Codeine sulfate; Metoprolol; Oseltamivir phosphate; Oxycodone-acetaminophen; Prednisone; and Sudafed [pseudoephedrine hcl]  Medications: Prior to Admission medications   Medication Sig Start Date End Date Taking? Authorizing Provider  letrozole (FEMARA) 2.5 MG tablet TAKE 1 TABLET (2.5 MG TOTAL) BY MOUTH DAILY. 09/16/17  Yes Nicholas Lose, MD  loratadine (CLARITIN) 10 MG tablet Take 10 mg by mouth daily.    Yes [provider]  losartan (COZAAR) 50 MG tablet Take 100 mg daily by mouth. 04/28/17  Yes [provider]  palbociclib (IBRANCE) 100 MG capsule TAKE 1 CAPSULE BY MOUTH ONCE DAILY WITH BREAKFAST. TAKE WHOLE WITH FOOD Patient taking differently: TAKE 1 CAPSULE BY MOUTH ONCE DAILY WITH BREAKFAST. TAKE WHOLE WITH FOOD. Take for 3 weeks on; 1 week off--alternating. 09/23/17  Yes Nicholas Lose, MD  Potassium 99 MG TABS Take 2 tablets 3 (three) times a week by mouth. Mondays, Wednesdays, and Fridays   Yes [provider]  vitamin B-12 (CYANOCOBALAMIN) 1000 MCG tablet Take 1,000 mcg by mouth 2 (two) times daily.   Yes [provider]  vitamin C (ASCORBIC ACID) 500 MG tablet Take 500 mg by mouth daily.     Yes [provider]  vitamin E 200 UNIT capsule Take 200 Units by mouth daily.     Yes [provider]  acetaminophen (TYLENOL) 500 MG tablet Take 500 mg by mouth every 6 (six) hours as needed for moderate pain.    [provider]  ELIQUIS 5 MG TABS tablet TAKE 1 TABLET (5 MG TOTAL) BY MOUTH 2 (TWO) TIMES DAILY. 08/11/17   Croitoru, Mihai, MD    furosemide (LASIX) 20 MG tablet Take 1 tablet (20 mg total) 3 (three) times a week by mouth. Mondays, Wednesdays, and Fridays 06/04/17   Croitoru, Mihai, MD  pravastatin (PRAVACHOL) 40 MG tablet Take 1 tablet (40 mg total) by mouth every evening. 09/03/17 12/02/17  Croitoru, Mihai, MD     Vital Signs: BP (!) 172/91   Pulse 90   Temp 98.5 F (36.9 C) (Oral)   Resp 18   Ht 5\' 2"  (1.575 m)   Wt 166 lb (75.3 kg)   SpO2 98%   BMI 30.36 kg/m   Physical Exam awake, alert.  Chest with few faint right basilar crackles, left clear.  Heart with regular rate and rhythm; abdomen soft, positive bowel sounds, nontender.  Bilateral lower extremity edema noted.  Imaging: No results found.  Labs:  CBC: Recent Labs    06/23/17 0930 09/23/17 1145 12/29/17 0920 01/15/18 1114  WBC 1.8* 2.7* 2.0* 2.9*  HGB 12.0 12.8 12.6 12.6  HCT 35.0 37.2 37.0 36.9  PLT 182 200 188 231    COAGS: Recent Labs    01/15/18 1114  INR 0.95  APTT 32    BMP: Recent Labs    05/29/17 1601 06/23/17 0930 09/23/17 1145 12/29/17 0920  NA 140 139 139 140  K 4.2 3.9 4.1 3.7  CL 103  --  107 108  CO2 21 21* 22 23  GLUCOSE 125* 82 95 89  BUN 22 22.2 29* 16  CALCIUM 9.1 9.1 10.0 9.0  CREATININE 1.05* 1.0 1.08 1.00  GFRNONAA 53*  --  49* 54*  GFRAA 61  --  57* >60    LIVER FUNCTION TESTS: Recent Labs    05/29/17 1601 06/23/17 0930 09/23/17 1145 12/29/17 0920  BILITOT 0.6 0.69 0.7 1.0  AST 22 22 26 24   ALT 12 13 18 13   ALKPHOS 75 74 67 70  PROT 6.3 7.1 7.1 7.2  ALBUMIN 4.3 3.9 3.9 4.1    Assessment and Plan: Pt with history of metastatic right breast carcinoma, initially diagnosed in 1998 with prior surgery and chemoradiation.  Recent follow-up imaging reveals new extensive bilateral hepatic metastases in addition to stable scattered sclerotic osseous metastatic disease, stable right pleural lesions.  She presents today for image guided liver lesion biopsy for further evaluation. Risks and  benefits discussed with the patient/family including, but not limited to bleeding, infection, damage to adjacent structures or low yield requiring additional tests.  All of the patient's questions were answered, patient is agreeable to proceed. Consent signed and in chart.    Electronically Signed: D. Rowe Robert, PA-C 01/15/2018, 11:58 AM   I spent a total of 20 minutes at the the patient's bedside AND on the patient's hospital floor or unit, greater than 50% of which was counseling/coordinating care for image guided liver lesion biopsy

## 2018-01-15 NOTE — Procedures (Signed)
Interventional Radiology Procedure Note  Procedure: US guided liver lesion biopsy  Complications: None  Estimated Blood Loss: None  Recommendations: - DC home after bedrest  Signed,  Criselda Peaches, MD

## 2018-01-16 ENCOUNTER — Inpatient Hospital Stay: Payer: Medicare Other | Admitting: Hematology and Oncology

## 2018-01-16 ENCOUNTER — Telehealth: Payer: Self-pay

## 2018-01-16 NOTE — Assessment & Plan Note (Deleted)
Metastatic breast cancer tolungs and bone: Clinically improving onweekly taxol beginning 05-09-16, several delays including for counts, rapid A fib, respiratory infection etc  Treatment summary: Taxol days 1 and 8 every 3 weeks with Neupogen along with Xgeva; completed 4cycles from 05/09/2016-08/29/2016 Taxol toxicities: Neutropenia: Requiring Neupogen with each treatment  Atrial fibrillation: Recurrent with RVR, cardioverted 07/25/2016, amiodarone, metoprolol and Lasix for CHF (Dr. Sallyanne Kuster) CKD stage III Pathologic compression fracture T10 status post radiation and laminectomy T6-L1 09/01/2015, radiation to right iliac area 05/2016  Rebiopsy of the lung nodule 09/19/2016: Metastatic breast cancer that is HER-2 negative,  01/01/2018 -------------------------------------------------------------------------------------------------------------------------------------------- Current treatment: LetrozolewithPalbociclib started 10/01/2016  Palbociclib toxicities: 1. Neutropenia: Currently Palbociclib dose is 100 mg daily. Bone metastases: She will receive Xgeva today and will receive it every 3 months.  CT chest abdomen pelvis6/13/2019: 3 new hypodense lesions in the liver 1.5 cm and 0.7 cm concerning for possible new metastatic lesions although nonspecific, stable bone metastases.  Stable pleural lesions.  Liver biopsy was performed yesterday.

## 2018-01-16 NOTE — Telephone Encounter (Signed)
Per Dr Lindi Adie, rescheduled pt's appt today for next Tues.  Spoke to pt earlier to reschedule, she called me back to let me know to keep her appt for July 2nd Tues at 2:45 pm - she had wanted her uncle to come but he will not be able to due to his appts so pt said to keep appt on 7-2 at 2:45 pm as I originally scheduled for her. No other needs per pt at this time.

## 2018-01-19 ENCOUNTER — Other Ambulatory Visit: Payer: Self-pay | Admitting: Hematology and Oncology

## 2018-01-20 ENCOUNTER — Telehealth: Payer: Self-pay | Admitting: Hematology and Oncology

## 2018-01-20 ENCOUNTER — Inpatient Hospital Stay: Payer: Medicare Other | Attending: Hematology and Oncology | Admitting: Hematology and Oncology

## 2018-01-20 ENCOUNTER — Ambulatory Visit: Payer: Medicare Other | Admitting: Hematology and Oncology

## 2018-01-20 ENCOUNTER — Other Ambulatory Visit: Payer: Self-pay

## 2018-01-20 DIAGNOSIS — Z923 Personal history of irradiation: Secondary | ICD-10-CM | POA: Insufficient documentation

## 2018-01-20 DIAGNOSIS — C50911 Malignant neoplasm of unspecified site of right female breast: Secondary | ICD-10-CM | POA: Insufficient documentation

## 2018-01-20 DIAGNOSIS — C782 Secondary malignant neoplasm of pleura: Secondary | ICD-10-CM | POA: Diagnosis not present

## 2018-01-20 DIAGNOSIS — I4891 Unspecified atrial fibrillation: Secondary | ICD-10-CM | POA: Insufficient documentation

## 2018-01-20 DIAGNOSIS — C7802 Secondary malignant neoplasm of left lung: Secondary | ICD-10-CM | POA: Diagnosis not present

## 2018-01-20 DIAGNOSIS — C50919 Malignant neoplasm of unspecified site of unspecified female breast: Secondary | ICD-10-CM

## 2018-01-20 DIAGNOSIS — C7951 Secondary malignant neoplasm of bone: Secondary | ICD-10-CM | POA: Insufficient documentation

## 2018-01-20 DIAGNOSIS — D701 Agranulocytosis secondary to cancer chemotherapy: Secondary | ICD-10-CM | POA: Diagnosis not present

## 2018-01-20 DIAGNOSIS — N183 Chronic kidney disease, stage 3 (moderate): Secondary | ICD-10-CM | POA: Diagnosis not present

## 2018-01-20 DIAGNOSIS — Z5111 Encounter for antineoplastic chemotherapy: Secondary | ICD-10-CM | POA: Insufficient documentation

## 2018-01-20 DIAGNOSIS — C7801 Secondary malignant neoplasm of right lung: Secondary | ICD-10-CM | POA: Diagnosis not present

## 2018-01-20 DIAGNOSIS — C787 Secondary malignant neoplasm of liver and intrahepatic bile duct: Secondary | ICD-10-CM | POA: Diagnosis not present

## 2018-01-20 MED ORDER — PALBOCICLIB 100 MG PO CAPS: ORAL_CAPSULE | ORAL | 3 refills | Status: DC

## 2018-01-20 NOTE — Progress Notes (Signed)
Patient Care Team: [name], MD as PCP - General (Internal Medicine)  DIAGNOSIS:  Encounter Diagnosis  Name Primary?  . 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 ([place])     SUMMARY OF ONCOLOGIC HISTORY:   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 [facility])   1998 Initial Diagnosis    Right breast carcinoma T2N1 ER/PR positive at right mastectomy with node evaluation Dec 1998, treated with adjuvant adriamycin cytoxan followed by 5 years of tamoxifen      2010 Relapse/Recurrence    Metastatic breast cancer to the pleura      08/29/2008 - 08/30/2015 Anti-estrogen oral therapy    Aromasin followed by letrozole      07/2015 Relapse/Recurrence    Progression to bone required aggressive laminectomy February 2017      08/30/2015 - 09/13/2015 Radiation Therapy    Palliative radiation to the spine,  Right ilium ad right hip      01/05/2016 - 04/29/2016 Chemotherapy    Xeloda stopped when she had progression of dsease       05/02/2016 PET scan     innumerable hypermetabolic lesions throughout the axial and appendicular skeleton, increase in size and number of new lesions in skull, right scapular glenoid, throughout sacrum. Findings concerning for liver metastases, numerous pulmonary nodules bilaterally      05/09/2016 - 09/18/2016 Chemotherapy    Taxol weekly initially and later changed her day 1 day 8 every 3 weeks (complicated by A. fib with RVR and prolonged respiratory infection) along with Xgeva       09/19/2016 Procedure    Biopsy of lung mass: Metastatic carcinoma consistent with breast origin, HER-2 negative ratio 1.37      10/01/2016 -  Anti-estrogen oral therapy    Palbociclib and Letrozole      06/23/2017 Imaging    CT chest abdomen pelvis: No evidence of progression of metastatic disease, bone metastases stable right-sided pleural metastases and pleural effusions are stable       CHIEF COMPLIANT: Follow-up to discuss the liver biopsy pathology report  INTERVAL  HISTORY: Lisa [name] is a 46-year with above-mentioned history of metastatic breast cancer who underwent a liver biopsy and is here today to discuss pathology report.  She has no symptoms or complaints related to these liver metastases.  Apart from mild fatigue she has had no problems tolerating Ibrance and letrozole.  REVIEW OF SYSTEMS:   Constitutional: Denies fevers, chills or abnormal weight loss Eyes: Denies blurriness of vision Ears, nose, mouth, throat, and face: Denies mucositis or sore throat Respiratory: Denies cough, dyspnea or wheezes Cardiovascular: Denies palpitation, chest discomfort Gastrointestinal:  Denies nausea, heartburn or change in bowel habits Skin: Denies abnormal skin rashes Lymphatics: Denies new lymphadenopathy or easy bruising Neurological:Denies numbness, tingling or new weaknesses Behavioral/Psych: Mood is stable, no new changes  Extremities: No lower extremity edema Breast:  denies any pain or lumps or nodules in either breasts All other systems were reviewed with the patient and are negative.  I have reviewed the past medical history, past surgical history, social history and family history with the patient and they are unchanged from previous note.  ALLERGIES:  is allergic to diphenhydramine hcl; codeine sulfate; metoprolol; oseltamivir phosphate; oxycodone-acetaminophen; prednisone; and sudafed [pseudoephedrine hcl].  MEDICATIONS:  Current Outpatient Medications  Medication Sig Dispense Refill  . acetaminophen (TYLENOL) 500 MG tablet Take 500 mg by mouth every 6 (six) hours as needed for moderate pain.    [name] ELIQUIS 5 MG TABS tablet TAKE 1  TABLET (5 MG TOTAL) BY MOUTH 2 (TWO) TIMES DAILY. 180 tablet 1  . furosemide (LASIX) 20 MG tablet Take 1 tablet (20 mg total) 3 (three) times a week by mouth. Mondays, Wednesdays, and Fridays 45 tablet 3  . letrozole (FEMARA) 2.5 MG tablet TAKE 1 TABLET (2.5 MG TOTAL) BY MOUTH DAILY. 90 tablet 3  . loratadine  (CLARITIN) 10 MG tablet Take 10 mg by mouth daily.     [name] losartan (COZAAR) 50 MG tablet Take 100 mg daily by mouth.  2  . palbociclib (IBRANCE) 100 MG capsule TAKE 1 CAPSULE BY MOUTH ONCE DAILY WITH BREAKFAST. TAKE WHOLE WITH FOOD (Patient taking differently: TAKE 1 CAPSULE BY MOUTH ONCE DAILY WITH BREAKFAST. TAKE WHOLE WITH FOOD. Take for 3 weeks on; 1 week off--alternating.) 21 capsule 3  . Potassium 99 MG TABS Take 2 tablets 3 (three) times a week by mouth. Mondays, Wednesdays, and Fridays    . pravastatin (PRAVACHOL) 40 MG tablet Take 1 tablet (40 mg total) by mouth every evening. 90 tablet 3  . vitamin B-12 (CYANOCOBALAMIN) 1000 MCG tablet Take 1,000 mcg by mouth 2 (two) times daily.    . vitamin C (ASCORBIC ACID) 500 MG tablet Take 500 mg by mouth daily.      . vitamin E 200 UNIT capsule Take 200 Units by mouth daily.       No current facility-administered medications for this visit.     PHYSICAL EXAMINATION: ECOG PERFORMANCE STATUS: 1 - Symptomatic but completely ambulatory  Vitals:   01/20/18 1435  BP: 140/62  Pulse: 84  Resp: 17  Temp: 99 F (37.2 C)  SpO2: 98%   Filed Weights   01/20/18 1435  Weight: 171 lb 1.6 oz (77.6 kg)    GENERAL:alert, no distress and comfortable SKIN: skin color, texture, turgor are normal, no rashes or significant lesions EYES: normal, Conjunctiva are pink and non-injected, sclera clear OROPHARYNX:no exudate, no erythema and lips, buccal mucosa, and tongue normal  NECK: supple, thyroid normal size, non-tender, without nodularity LYMPH:  no palpable lymphadenopathy in the cervical, axillary or inguinal LUNGS: clear to auscultation and percussion with normal breathing effort HEART: regular rate & rhythm and no murmurs and no lower extremity edema ABDOMEN:abdomen soft, non-tender and normal bowel sounds MUSCULOSKELETAL:no cyanosis of digits and no clubbing  NEURO: alert & oriented x 3 with fluent speech, no focal motor/sensory  deficits EXTREMITIES: No lower extremity edema  LABORATORY DATA:  I have reviewed the data as listed CMP Latest Ref Rng & Units 12/29/2017 09/23/2017 06/23/2017  Glucose 70 - 140 mg/dL 89 95 82  BUN 7 - 26 mg/dL 16 29(H) 22.2  Creatinine 0.60 - 1.10 mg/dL 1.00 1.08 1.0  Sodium 136 - 145 mmol/L 140 139 139  Potassium 3.5 - 5.1 mmol/L 3.7 4.1 3.9  Chloride 98 - 109 mmol/L 108 107 -  CO2 22 - 29 mmol/L 23 22 21(L)  Calcium 8.4 - 10.4 mg/dL 9.0 10.0 9.1  Total Protein 6.4 - 8.3 g/dL 7.2 7.1 7.1  Total Bilirubin 0.2 - 1.2 mg/dL 1.0 0.7 0.69  Alkaline Phos 40 - 150 U/L 70 67 74  AST 5 - 34 U/L 24 26 22   ALT 0 - 55 U/L 13 18 13     Lab Results  Component Value Date   WBC 2.9 (L) 01/15/2018   HGB 12.6 01/15/2018   HCT 36.9 01/15/2018   MCV 101.1 (H) 01/15/2018   PLT 231 01/15/2018   NEUTROABS 1.3 (L) 12/29/2017  ASSESSMENT & PLAN:  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 [name]) Metastatic breast cancer tolungs and bone: Clinically improving onweekly taxol beginning 05-09-16, several delays including for counts, rapid A fib, respiratory infection etc  Treatment summary: Taxol days 1 and 8 every 3 weeks with Neupogen along with Xgeva; completed 4cycles from 05/09/2016-08/29/2016 Taxol toxicities: Neutropenia: Requiring Neupogen with each treatment  Atrial fibrillation: Recurrent with RVR, cardioverted 07/25/2016, amiodarone, metoprolol and Lasix for CHF (Dr. Sallyanne Kuster) CKD stage III Pathologic compression fracture T10 status post radiation and laminectomy T6-L1 09/01/2015, radiation to right iliac area 05/2016  Rebiopsy of the lung nodule 09/19/2016: Metastatic breast cancer that is HER-2 negative,   -------------------------------------------------------------------------------------------------------------------------------------------- Current treatment: LetrozolewithPalbociclib started 10/01/2016 switched to Faslodex with Ibrance 02/03/2018  Palbociclib toxicities: 1.  Neutropenia: Currently Palbociclib dose is 100 mg daily. Bone metastases: She will receive Xgeva today and will receive it every 3 months.  CT chest abdomen pelvis6/13/2019: 3 new hypodense lesions in the liver 1.5 cm and 0.7 cm concerning for possible new metastatic lesions although nonspecific, stable bone metastases.  Stable pleural lesions.  Liver biopsy: 01/15/2018: Positive for metastatic breast cancer HER-2 negative, ER 100%, PR 90%  Pathology review: I discussed with the patient that even though the liver biopsy is positive for metastatic breast cancer, I recommended switching her to Faslodex.  We will start Faslodex in 02/03/2018. I counseled extensively about Faslodex related side effects and provided a copy of the drug information guide. I will see her back 1 month after Faslodex has been started.    No orders of the defined types were placed in this encounter.  The patient has a good understanding of the overall plan. she agrees with it. she will call with any problems that may develop before the next visit here.   Harriette Ohara, MD 01/20/18

## 2018-01-20 NOTE — Telephone Encounter (Signed)
Gave patient avs and calendar of upcoming July through October appts.

## 2018-01-20 NOTE — Assessment & Plan Note (Signed)
Metastatic breast cancer tolungs and bone: Clinically improving onweekly taxol beginning 05-09-16, several delays including for counts, rapid A fib, respiratory infection etc  Treatment summary: Taxol days 1 and 8 every 3 weeks with Neupogen along with Xgeva; completed 4cycles from 05/09/2016-08/29/2016 Taxol toxicities: Neutropenia: Requiring Neupogen with each treatment  Atrial fibrillation: Recurrent with RVR, cardioverted 07/25/2016, amiodarone, metoprolol and Lasix for CHF (Dr. Sallyanne Kuster) CKD stage III Pathologic compression fracture T10 status post radiation and laminectomy T6-L1 09/01/2015, radiation to right iliac area 05/2016  Rebiopsy of the lung nodule 09/19/2016: Metastatic breast cancer that is HER-2 negative,  01/01/2018 -------------------------------------------------------------------------------------------------------------------------------------------- Current treatment: LetrozolewithPalbociclib started 10/01/2016  Palbociclib toxicities: 1. Neutropenia: Currently Palbociclib dose is 100 mg daily. Bone metastases: She will receive Xgeva today and will receive it every 3 months.  CT chest abdomen pelvis6/13/2019: 3 new hypodense lesions in the liver 1.5 cm and 0.7 cm concerning for possible new metastatic lesions although nonspecific, stable bone metastases.  Stable pleural lesions.  Liver biopsy: 01/15/2018: Positive for metastatic breast cancer HER-2 negative  Pathology review: I discussed with the patient that even though the liver biopsy is positive for metastatic breast cancer, given her lack of symptoms, I recommended continuation of the current treatment and rechecking her scans in couple of months.  Return to clinic in 2 months with CT abdomen and follow-up

## 2018-01-23 ENCOUNTER — Telehealth: Payer: Self-pay

## 2018-01-23 NOTE — Telephone Encounter (Signed)
Received [facility] notification that pt called in with the following question : wants to verify when she can stop taking Letrozole.  Returned her call, no answer. Per Dr Geralyn Flash notes, I left pt a VM for patient to let her know that she can go ahead and stop the Letrozole and she has upcoming appt for the injection, Faslodex on 7-16.  I left office contact info if any further questions.

## 2018-01-26 MED FILL — IBRANCE 100 MG CAPSULE: 100 | 28 days supply | Qty: 21 | Fill #0

## 2018-01-28 MED FILL — LOSARTAN POTASSIUM 50 MG TA: 50 | 90 days supply | Qty: 180 | Fill #0

## 2018-02-03 ENCOUNTER — Inpatient Hospital Stay: Payer: Medicare Other

## 2018-02-03 DIAGNOSIS — C7951 Secondary malignant neoplasm of bone: Secondary | ICD-10-CM

## 2018-02-03 DIAGNOSIS — C50911 Malignant neoplasm of unspecified site of right female breast: Secondary | ICD-10-CM

## 2018-02-03 DIAGNOSIS — D701 Agranulocytosis secondary to cancer chemotherapy: Secondary | ICD-10-CM

## 2018-02-03 DIAGNOSIS — T451X5A Adverse effect of antineoplastic and immunosuppressive drugs, initial encounter: Principal | ICD-10-CM

## 2018-02-03 DIAGNOSIS — C50919 Malignant neoplasm of unspecified site of unspecified female breast: Secondary | ICD-10-CM

## 2018-02-03 MED ORDER — FULVESTRANT 250 MG/5ML IM SOLN: 500.0000 mg | Freq: Once | INTRAMUSCULAR | Status: DC

## 2018-02-03 NOTE — Patient Instructions (Signed)

## 2018-02-09 ENCOUNTER — Other Ambulatory Visit: Payer: Self-pay | Admitting: Cardiovascular Disease

## 2018-02-09 ENCOUNTER — Other Ambulatory Visit: Payer: Self-pay | Admitting: Cardiology

## 2018-02-09 DIAGNOSIS — I5032 Chronic diastolic (congestive) heart failure: Secondary | ICD-10-CM

## 2018-02-09 MED FILL — FUROSEMIDE 40 MG TAB: 40 | 90 days supply | Qty: 90 | Fill #0

## 2018-02-09 MED FILL — ELIQUIS 5 MG TABLET: 5 | 90 days supply | Qty: 180 | Fill #0

## 2018-02-17 ENCOUNTER — Inpatient Hospital Stay: Payer: Medicare Other

## 2018-02-17 DIAGNOSIS — C50911 Malignant neoplasm of unspecified site of right female breast: Secondary | ICD-10-CM

## 2018-02-17 DIAGNOSIS — D701 Agranulocytosis secondary to cancer chemotherapy: Secondary | ICD-10-CM

## 2018-02-17 DIAGNOSIS — T451X5A Adverse effect of antineoplastic and immunosuppressive drugs, initial encounter: Principal | ICD-10-CM

## 2018-02-17 DIAGNOSIS — C7951 Secondary malignant neoplasm of bone: Secondary | ICD-10-CM

## 2018-02-17 DIAGNOSIS — Z5111 Encounter for antineoplastic chemotherapy: Secondary | ICD-10-CM | POA: Diagnosis not present

## 2018-02-17 DIAGNOSIS — C50919 Malignant neoplasm of unspecified site of unspecified female breast: Secondary | ICD-10-CM

## 2018-02-17 MED ORDER — FULVESTRANT 250 MG/5ML IM SOLN
500.0000 mg | Freq: Once | INTRAMUSCULAR | Status: AC
  Administered 2018-02-17: 500 mg via INTRAMUSCULAR

## 2018-02-17 MED FILL — IBRANCE 100 MG CAPSULE: 100 | 28 days supply | Qty: 21 | Fill #1

## 2018-02-17 NOTE — Patient Instructions (Signed)

## 2018-02-25 MED FILL — PRAVASTATIN NA 40 MG TAB: 40 | 90 days supply | Qty: 90 | Fill #2

## 2018-02-26 ENCOUNTER — Telehealth: Payer: Self-pay

## 2018-02-26 ENCOUNTER — Other Ambulatory Visit: Payer: Self-pay

## 2018-02-26 ENCOUNTER — Inpatient Hospital Stay: Payer: Medicare Other | Attending: Hematology and Oncology

## 2018-02-26 ENCOUNTER — Other Ambulatory Visit (HOSPITAL_COMMUNITY): Payer: Self-pay | Admitting: *Deleted

## 2018-02-26 ENCOUNTER — Inpatient Hospital Stay: Payer: Medicare Other | Admitting: Medical

## 2018-02-26 ENCOUNTER — Ambulatory Visit (INDEPENDENT_AMBULATORY_CARE_PROVIDER_SITE_OTHER): Payer: Medicare Other | Admitting: *Deleted

## 2018-02-26 VITALS — BP 111/77

## 2018-02-26 VITALS — BP 120/98 | HR 85 | Temp 98.8°F | Resp 18 | Ht 62.0 in | Wt 178.0 lb

## 2018-02-26 DIAGNOSIS — C782 Secondary malignant neoplasm of pleura: Secondary | ICD-10-CM | POA: Insufficient documentation

## 2018-02-26 DIAGNOSIS — C50911 Malignant neoplasm of unspecified site of right female breast: Secondary | ICD-10-CM

## 2018-02-26 DIAGNOSIS — Z170 Estrogen receptor positive status [ER+]: Secondary | ICD-10-CM | POA: Insufficient documentation

## 2018-02-26 DIAGNOSIS — C7951 Secondary malignant neoplasm of bone: Secondary | ICD-10-CM

## 2018-02-26 DIAGNOSIS — R0602 Shortness of breath: Secondary | ICD-10-CM

## 2018-02-26 DIAGNOSIS — G8929 Other chronic pain: Secondary | ICD-10-CM | POA: Diagnosis not present

## 2018-02-26 DIAGNOSIS — R0600 Dyspnea, unspecified: Secondary | ICD-10-CM

## 2018-02-26 DIAGNOSIS — C7800 Secondary malignant neoplasm of unspecified lung: Secondary | ICD-10-CM | POA: Diagnosis not present

## 2018-02-26 DIAGNOSIS — I130 Hypertensive heart and chronic kidney disease with heart failure and stage 1 through stage 4 chronic kidney disease, or unspecified chronic kidney disease: Secondary | ICD-10-CM | POA: Insufficient documentation

## 2018-02-26 DIAGNOSIS — Z87891 Personal history of nicotine dependence: Secondary | ICD-10-CM | POA: Diagnosis not present

## 2018-02-26 DIAGNOSIS — I480 Paroxysmal atrial fibrillation: Secondary | ICD-10-CM

## 2018-02-26 DIAGNOSIS — N183 Chronic kidney disease, stage 3 (moderate): Secondary | ICD-10-CM | POA: Insufficient documentation

## 2018-02-26 DIAGNOSIS — M549 Dorsalgia, unspecified: Secondary | ICD-10-CM | POA: Insufficient documentation

## 2018-02-26 DIAGNOSIS — Z5111 Encounter for antineoplastic chemotherapy: Secondary | ICD-10-CM | POA: Diagnosis present

## 2018-02-26 DIAGNOSIS — R609 Edema, unspecified: Secondary | ICD-10-CM

## 2018-02-26 DIAGNOSIS — C50919 Malignant neoplasm of unspecified site of unspecified female breast: Secondary | ICD-10-CM

## 2018-02-26 DIAGNOSIS — R0609 Other forms of dyspnea: Secondary | ICD-10-CM

## 2018-02-26 DIAGNOSIS — Z79899 Other long term (current) drug therapy: Secondary | ICD-10-CM | POA: Diagnosis not present

## 2018-02-26 LAB — CMP (CANCER CENTER ONLY)
ALT: 26 U/L (ref 0–44)
ANION GAP: 11 (ref 5–15)
AST: 35 U/L (ref 15–41)
Albumin: 3.8 g/dL (ref 3.5–5.0)
Alkaline Phosphatase: 81 U/L (ref 38–126)
BUN: 25 mg/dL — ABNORMAL HIGH (ref 8–23)
CHLORIDE: 110 mmol/L (ref 98–111)
CO2: 21 mmol/L — AB (ref 22–32)
Calcium: 9.3 mg/dL (ref 8.9–10.3)
Creatinine: 1.21 mg/dL — ABNORMAL HIGH (ref 0.44–1.00)
GFR, EST AFRICAN AMERICAN: 50 mL/min — AB (ref >60)
GFR, Estimated: 43 mL/min — ABNORMAL LOW (ref >60)
Glucose, Bld: 105 mg/dL — ABNORMAL HIGH (ref 70–99)
POTASSIUM: 4.5 mmol/L (ref 3.5–5.1)
SODIUM: 142 mmol/L (ref 135–145)
Total Bilirubin: 1.1 mg/dL (ref 0.3–1.2)
Total Protein: 6.8 g/dL (ref 6.5–8.1)

## 2018-02-26 LAB — CBC WITH DIFFERENTIAL (CANCER CENTER ONLY)
Basophils Absolute: 0.1 10*3/uL (ref 0.0–0.1)
Basophils Relative: 2 %
Eosinophils Absolute: 0.1 10*3/uL (ref 0.0–0.5)
Eosinophils Relative: 2 %
HEMATOCRIT: 36.2 % (ref 34.8–46.6)
HEMOGLOBIN: 12.2 g/dL (ref 11.6–15.9)
LYMPHS ABS: 0.8 10*3/uL — AB (ref 0.9–3.3)
LYMPHS PCT: 28 %
MCH: 34.7 pg — AB (ref 25.1–34.0)
MCHC: 33.8 g/dL (ref 31.5–36.0)
MCV: 102.7 fL — AB (ref 79.5–101.0)
MONOS PCT: 9 %
Monocytes Absolute: 0.3 10*3/uL (ref 0.1–0.9)
NEUTROS ABS: 1.7 10*3/uL (ref 1.5–6.5)
NEUTROS PCT: 59 %
Platelet Count: 179 10*3/uL (ref 145–400)
RBC: 3.52 MIL/uL — AB (ref 3.70–5.45)
RDW: 14 % (ref 11.2–14.5)
WBC Count: 2.9 10*3/uL — ABNORMAL LOW (ref 3.9–10.3)

## 2018-02-26 MED ORDER — DILTIAZEM HCL ER COATED BEADS 120 MG PO CP24: 120.0000 mg | ORAL_CAPSULE | Freq: Every day | ORAL | 1 refills | Status: DC

## 2018-02-26 MED FILL — CARTIA XT 120 MG CAPSULE SA: 120 | 30 days supply | Qty: 30 | Fill #0

## 2018-02-26 NOTE — Telephone Encounter (Signed)
Spoke with patient regarding shortness of breath, wheezing, joint pain, and fatigue.  Patient received Faslodex 02/17/2018.  Per patient over the past 4 days she has gradually noticed increase in shortness of breath.  Patient denies fever, and chills.  H/o CHF, and Afib.  Patient reported taking increased dose of Lasix with no relief in shortness of breath.  Nurse recommended for patient to be seen in clinic by Sandi Mealy, PA.  Sandi Mealy verbal orders given for CBC, CMP, and CXR stat.  Orders placed, patient voiced agreement and will be coming to clinic.

## 2018-02-26 NOTE — Progress Notes (Signed)
Pt presented to office today with c/o SOB, weakness, feeling off balance, and headed that started Tuesday. Pt states she was started on a new medication call Faslodex and thought symptoms could be related to medication. Pt went to [name] oncology office and had an EKG performed that showed AFib HR 143. Pt was last seen by Dr. Loletha Grayer on 5/13 which at that time EKG report showed sinus Rhythm.   Repeat EKG at nurse visit reports Afib HR 135, BP 111/77. EKG was reviewed by Dr. Warren Lacy (DOD) who recommended contacting A fib clinic. Per Roderic Palau, NP, pt is to start on Cardizem 120 mg today and an appointment scheduled for tomorrow 8/9//19 at 1030. Pt advised of recommendation and verbalized understanding.

## 2018-02-26 NOTE — Patient Instructions (Signed)

## 2018-02-27 ENCOUNTER — Encounter (HOSPITAL_COMMUNITY): Payer: Self-pay | Admitting: Nurse Practitioner

## 2018-02-27 ENCOUNTER — Other Ambulatory Visit: Payer: Self-pay

## 2018-02-27 ENCOUNTER — Ambulatory Visit (HOSPITAL_COMMUNITY)
Admission: RE | Admit: 2018-02-27 | Discharge: 2018-02-27 | Disposition: A | Discharge status: Home / Self Care | Payer: Medicare Other | Source: Ambulatory Visit | Attending: Nurse Practitioner | Admitting: Nurse Practitioner

## 2018-02-27 VITALS — BP 124/72 | HR 85 | Ht 62.0 in | Wt 176.0 lb

## 2018-02-27 DIAGNOSIS — I129 Hypertensive chronic kidney disease with stage 1 through stage 4 chronic kidney disease, or unspecified chronic kidney disease: Secondary | ICD-10-CM | POA: Insufficient documentation

## 2018-02-27 DIAGNOSIS — G8929 Other chronic pain: Secondary | ICD-10-CM | POA: Insufficient documentation

## 2018-02-27 DIAGNOSIS — Z887 Allergy status to serum and vaccine status: Secondary | ICD-10-CM | POA: Diagnosis not present

## 2018-02-27 DIAGNOSIS — Z7901 Long term (current) use of anticoagulants: Secondary | ICD-10-CM | POA: Insufficient documentation

## 2018-02-27 DIAGNOSIS — Z79899 Other long term (current) drug therapy: Secondary | ICD-10-CM | POA: Diagnosis not present

## 2018-02-27 DIAGNOSIS — Z8042 Family history of malignant neoplasm of prostate: Secondary | ICD-10-CM | POA: Diagnosis not present

## 2018-02-27 DIAGNOSIS — I480 Paroxysmal atrial fibrillation: Secondary | ICD-10-CM

## 2018-02-27 DIAGNOSIS — M549 Dorsalgia, unspecified: Secondary | ICD-10-CM | POA: Diagnosis not present

## 2018-02-27 DIAGNOSIS — Z9010 Acquired absence of unspecified breast and nipple: Secondary | ICD-10-CM | POA: Insufficient documentation

## 2018-02-27 DIAGNOSIS — Z833 Family history of diabetes mellitus: Secondary | ICD-10-CM | POA: Insufficient documentation

## 2018-02-27 DIAGNOSIS — I481 Persistent atrial fibrillation: Secondary | ICD-10-CM | POA: Diagnosis not present

## 2018-02-27 DIAGNOSIS — Z8249 Family history of ischemic heart disease and other diseases of the circulatory system: Secondary | ICD-10-CM | POA: Diagnosis not present

## 2018-02-27 DIAGNOSIS — Z853 Personal history of malignant neoplasm of breast: Secondary | ICD-10-CM | POA: Insufficient documentation

## 2018-02-27 DIAGNOSIS — N182 Chronic kidney disease, stage 2 (mild): Secondary | ICD-10-CM | POA: Diagnosis not present

## 2018-02-27 DIAGNOSIS — Z888 Allergy status to other drugs, medicaments and biological substances status: Secondary | ICD-10-CM | POA: Insufficient documentation

## 2018-02-27 DIAGNOSIS — Z825 Family history of asthma and other chronic lower respiratory diseases: Secondary | ICD-10-CM | POA: Diagnosis not present

## 2018-02-27 DIAGNOSIS — Z885 Allergy status to narcotic agent status: Secondary | ICD-10-CM | POA: Diagnosis not present

## 2018-02-27 DIAGNOSIS — Z87891 Personal history of nicotine dependence: Secondary | ICD-10-CM | POA: Diagnosis not present

## 2018-02-27 NOTE — Progress Notes (Signed)
Primary Care Physician: [name], MD Referring Physician: Juanda Bond triage Cardiologist: Dr. Rolla Plate is a 77 y.o. female with a h/o paroxysmal atrial fibrillation that led to decompensated heart failure in late 2017, cardioversion 05/27/2016, with recurrence and a successful cardioversion in ED 07/25/2016, with low dose amiodarone treated with amiodarone, stopped November 2018. She is on anticoagulation with Eliquis without bleeding complications.  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  She walked into cancer center yesterday not feeling right and was found to be in afib with rvr. She was then asked to go [facility]. Dr. Loletha Grayer is on vacation. Triage nurse called here for advise, she was started on Cardizem 120 mg qd and asked to come today for an appointment. She started feeling better within 1-2 hours of taking Cardizem and today is in SR. Dis not tolerate BB 's in the past.She has been under a lot of stress from dealing with her cancer and new treatments and being the Executor of her mothers' estate who died this past 28-Jan-2023.   Today, she denies symptoms of palpitations, chest pain, shortness of breath, orthopnea, PND, lower extremity edema, dizziness, presyncope, syncope, or neurologic sequela. The patient is tolerating medications without difficulties and is otherwise without complaint today.   Past Medical History:  Diagnosis Date  . Breast cancer ([place]) 07-13-1997   right  . Chronic back pain   . CKD (chronic kidney disease), stage III ([place])   . Complication of anesthesia   . Coronary artery calcification of native artery    a. seen on PET scan 04/2016.  [name] Hypertension   . Pericardial effusion    a. small by TEE 05/2016.  [name] Persistent atrial fibrillation ([place])   . Pleural effusion  11-08-2008  . PONV (postoperative nausea and vomiting)   . Radiation 08/17/2015-08/29/2015   lower thoracic spine 22.5 gray  . Radiation 01/17/16-01/24/16   right femur 20Gy   Past Surgical History:  Procedure Laterality Date  . ABDOMINAL HYSTERECTOMY  02-21-1982  . BREAST BIOPSY  07-13-1997  . CARDIOVERSION N/A 05/27/2016   Procedure: CARDIOVERSION;  Surgeon: [name], MD;  Location: [place];  Service: Cardiovascular;  Laterality: N/A;  . DILATION AND CURETTAGE, DIAGNOSTIC / THERAPEUTIC  12-13-1981  . MASTECTOMY  07-29-1997  . PELVIC LAPAROSCOPY  11-06-1981  . POSTERIOR LUMBAR FUSION 4 LEVEL N/A 09/01/2015   Procedure: Thoracic six-Lumbar one fusion with arthrodesis pedicle screw stabilization and decompression;  Surgeon: Ashok Pall, MD;  Location: [facility]UR[facility]S;  Service: Neurosurgery;  Laterality: N/A;  T6-L1 fusion with arthrodesis pedicle screw stabilization and decompression  . TEE WITHOUT CARDIOVERSION N/A 05/27/2016   Procedure: TRANSESOPHAGEAL ECHOCARDIOGRAM (TEE);  Surgeon: [name], MD;  Location: [facility] ENDOSCOPY;  Service: Cardiovascular;  Laterality: N/A;  . THORACENTESIS  10-18-2008    Current Outpatient Medications  Medication Sig Dispense Refill  . acetaminophen (TYLENOL) 500 MG tablet Take 500 mg by mouth every 6 (six) hours as needed for moderate pain.    [name] denosumab (PROLIA) 60 MG/ML SOSY injection Inject 60 mg into the skin every 3 (three) months.    . diltiazem (CARDIZEM CD) 120 MG 24 hr capsule Take 1 capsule (120 mg total) by mouth daily. 30 capsule 1  . ELIQUIS 5 MG TABS tablet TAKE 1 TABLET BY MOUTH TWICE DAILY 180 tablet 1  . fulvestrant (  FASLODEX) 250 MG/5ML injection Inject into the muscle once. One injection each buttock over 1-2 minutes. Warm prior to use.    . furosemide (LASIX) 40 MG tablet TAKE 1 TABLET (40 MG TOTAL) BY MOUTH DAILY. (Patient taking differently: Take 20 mg by mouth daily. Take 20mg  3 times a week and an extra 20 mg if needed.) 90 tablet 2    . loratadine (CLARITIN) 10 MG tablet Take 10 mg by mouth daily.     [name] losartan (COZAAR) 50 MG tablet Take 100 mg daily by mouth.  2  . Multiple Vitamins-Minerals (PRESERVISION AREDS 2+MULTI VIT PO) Take by mouth.    . palbociclib (IBRANCE) 100 MG capsule TAKE 1 CAPSULE BY MOUTH ONCE DAILY WITH BREAKFAST. TAKE WHOLE WITH FOOD. Take for 3 weeks on; 1 week off--alternating. 21 capsule 3  . Potassium 99 MG TABS Take 2 tablets 3 (three) times a week by mouth. Mondays, Wednesdays, and Fridays    . pravastatin (PRAVACHOL) 40 MG tablet Take 1 tablet (40 mg total) by mouth every evening. 90 tablet 3  . vitamin B-12 (CYANOCOBALAMIN) 1000 MCG tablet Take 1,000 mcg by mouth 2 (two) times daily.    . vitamin C (ASCORBIC ACID) 500 MG tablet Take 500 mg by mouth daily.      . vitamin E 200 UNIT capsule Take 200 Units by mouth daily.       No current facility-administered medications for this encounter.     Allergies  Allergen Reactions  . Diphenhydramine Hcl Other (See Comments)    Severe headaches  . Codeine Sulfate Rash    REACTION: rash. Makes her jittery  . Metoprolol Other (See Comments)    Face Kept turning red  . Oseltamivir Phosphate Nausea And Vomiting and Rash  . Oxycodone-Acetaminophen Nausea And Vomiting    REACTION: emesis  . Prednisone Other (See Comments)    REACTION: hiatal hernia, runny  Nose, felt terrible  . Sudafed [Pseudoephedrine Hcl] Other (See Comments)    vertigo    Social History   Socioeconomic History  . Marital status: Single    Spouse name: Not on file  . Number of children: 0  . Years of education: Not on file  . Highest education level: Not on file  Occupational History  . Occupation: Firefighter for [employer]  . Financial resource strain: Not on file  . Food insecurity:    Worry: Not on file    Inability: Not on file  . Transportation needs:    Medical: Not on file    Non-medical: Not on file  Tobacco Use  . Smoking  status: Former Smoker    Packs/day: 0.50    Years: 30.00    Pack years: 15.00    Types: Cigarettes    Last attempt to quit: 07/22/1996    Years since quitting: 21.6  . Smokeless tobacco: Never Used  . Tobacco comment: 1/2 up to 1 ppd  Substance and Sexual Activity  . Alcohol use: No    Alcohol/week: 0.0 standard drinks    Comment: hx of alcohol socially - hasn't had drink since March 1998  . Drug use: No  . Sexual activity: Not on file  Lifestyle  . Physical activity:    Days per week: Not on file    Minutes per session: Not on file  . Stress: Not on file  Relationships  . Social connections:    Talks on phone: Not on file    Gets together: Not  on file    Attends religious service: Not on file    Active member of club or organization: Not on file    Attends meetings of clubs or organizations: Not on file    Relationship status: Not on file  . Intimate partner violence:    Fear of current or ex partner: Not on file    Emotionally abused: Not on file    Physically abused: Not on file    Forced sexual activity: Not on file  Other Topics Concern  . Not on file  Social History Narrative  . Not on file    Family History  Problem Relation Age of Onset  . Heart disease Mother        with pacemaker   . Asthma Father   . Heart attack Maternal Grandfather 64  . Diabetes Paternal Grandmother   . Aortic stenosis Maternal Uncle   . Prostate cancer Maternal Uncle        dx unspecified age  . Prostate cancer Maternal Uncle        dx older than 50y  . Prostate cancer Maternal Uncle        dx older than 50y  . Prostate cancer Maternal Uncle 72       s/p prostatectomy  . Breast cancer Cousin 49       maternal 1st cousin; s/p lumpectomy and radiation  . Breast cancer Cousin        maternal 1st cousin dx mid-late 11s; s/p mastectomy    ROS- All systems are reviewed and negative except as per the HPI above  Physical Exam: Vitals:   02/27/18 1021  BP: 124/72  Pulse: 85    Weight: 79.8 kg  Height: 5\' 2"  (1.575 m)   Wt Readings from Last 3 Encounters:  02/27/18 79.8 kg  02/26/18 80.7 kg  01/20/18 77.6 kg    Labs: Lab Results  Component Value Date   NA 142 02/26/2018   K 4.5 02/26/2018   CL 110 02/26/2018   CO2 21 (L) 02/26/2018   GLUCOSE 105 (H) 02/26/2018   BUN 25 (H) 02/26/2018   CREATININE 1.21 (H) 02/26/2018   CALCIUM 9.3 02/26/2018   PHOS 3.3 05/31/2016   MG 2.1 05/31/2016   Lab Results  Component Value Date   INR 0.95 01/15/2018   Lab Results  Component Value Date   CHOL 134 05/23/2016   HDL 31 (L) 05/23/2016   LDLCALC 54 05/23/2016   TRIG 247 (H) 05/23/2016     GEN- The patient is well appearing, alert and oriented x 3 today.   Head- normocephalic, atraumatic Eyes-  Sclera clear, conjunctiva pink Ears- hearing intact Oropharynx- clear Neck- supple, no JVP Lymph- no cervical lymphadenopathy Lungs- Clear to ausculation bilaterally, normal work of breathing Heart- Regular rate and rhythm, no murmurs, rubs or gallops, PMI not laterally displaced GI- soft, NT, ND, + BS Extremities- no clubbing, cyanosis, or edema MS- no significant deformity or atrophy Skin- no rash or lesion Psych- euthymic mood, full affect Neuro- strength and sensation are intact  EKG-SR with PAC's, pr int 172 ms, qrs int 76 ms, qtc 459 ms Epic records reviewed    Assessment and Plan: 1. Paroxysmal  afib Off amiodarone for many months now Onset of afib with rvr yesterday Returned to SR with Cardizem 120 mg daily Continue this dose, tolerating well   2. CHA2DS2VASc score of at least 4 Continue eliquis 5 mg bid  Will request f/u with Dr. Loletha Grayer in 4-6  weeks afib clinic as needed  Geroge Baseman. Braycen Burandt, [facility] [address] [place], [place] [address] [phone]

## 2018-03-02 ENCOUNTER — Other Ambulatory Visit: Payer: Self-pay | Admitting: *Deleted

## 2018-03-02 DIAGNOSIS — C50911 Malignant neoplasm of unspecified site of right female breast: Secondary | ICD-10-CM

## 2018-03-02 DIAGNOSIS — C782 Secondary malignant neoplasm of pleura: Principal | ICD-10-CM

## 2018-03-02 NOTE — Progress Notes (Signed)
Symptoms Management Clinic Progress Note   Lisa [name] [id number] [birth year] 77 y.o.  Lisa [name] is managed by Dr. 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 fib with a rapid ventricular response with premature aberrantly conducted complexes at a rate of 143 bpm.  There were nonspecific ST and T wave abnormalities.  The patient was given a copy of her x-ray and will proceed to see her cardiologist today.  Dyspnea on exertion: I have strong suspicions that the patient's dyspnea on exertion is related to her recurrent A. fib.  She we will proceed to see her cardiologist today.  Metastatic breast cancer: The patient continues to be managed by Dr. Nicholas Lose and is treated with Delton See, Faslodex, letrozole, and Ibrance.  The patient believes that some of her symptoms could be related to Faslodex.  These include joint pain, pain in the hips, knees, and ankles, and generalized weakness.  She is scheduled to see Dr. Lindi Adie in follow-up on 03/03/2018.  Please see After Visit Summary for patient specific instructions.  Future Appointments  Date Time Provider [place]  03/03/2018  1:30 PM [facility] LAB 3 [facility] None  03/03/2018  2:00 PM Nicholas Lose, MD [facility] None  03/03/2018  2:30 PM [facility] [place] None  03/31/2018  1:00 PM [facility] [place] None  04/20/2018 11:00 AM Croitoru, Mihai, MD [facility]  04/28/2018  1:15 PM [facility] [place] [facility] None    Orders Placed This Encounter  Procedures  . EKG 12-Lead       Subjective:    Patient ID:  Lisa [name] is a 77 y.o. (DOB Nov 15, 1940) female.  Chief Complaint:  Chief Complaint  Patient presents with  . Shortness of Breath    HPI Lisa [name] is a 77 year old female with a history of a metastatic breast cancer who is managed by Dr. Nicholas Lose and is currently treated with Delton See, Faslodex, letrozole, and Ibrance.  She has a history of A. fib and congestive heart failure which is followed by cardiology.  She continues to take Lasix but is having some swelling in her legs.  She has had shortness of breath and dyspnea on exertion since Tuesday.  She is also had a nonproductive cough, generalized weakness, headaches, unstable gait, chest tightness, joint pain, hips, knees, and ankle pain.  She has believe that some of the symptoms could be associated with Faslodex.  Many of the symptoms occurred after her first injection of Faslodex.  Medications: I have reviewed the patient's current medications.  Allergies:  Allergies  Allergen Reactions  . Diphenhydramine Hcl Other (See Comments)    Severe headaches  . Codeine Sulfate Rash    REACTION: rash. Makes her jittery  . Metoprolol Other (See Comments)    Face Kept turning red  . Oseltamivir Phosphate Nausea And Vomiting and Rash  . Oxycodone-Acetaminophen Nausea And Vomiting    REACTION: emesis  . Prednisone Other (See Comments)    REACTION: hiatal hernia, runny  Nose, felt terrible  . Sudafed [Pseudoephedrine Hcl] Other (See Comments)    vertigo    Past Medical History:  Diagnosis Date  . Breast cancer (  [place]) 07-13-1997   right  . Chronic back pain   . CKD (chronic kidney disease), stage III ([place])   . Complication of anesthesia   . Coronary artery calcification of native artery    a. seen on PET scan 04/2016.  [name] Hypertension   . Pericardial effusion    a. small by TEE 05/2016.  [name] Persistent atrial fibrillation ([place])   . Pleural effusion 11-08-2008  . PONV (postoperative nausea and vomiting)    . Radiation 08/17/2015-08/29/2015   lower thoracic spine 22.5 gray  . Radiation 01/17/16-01/24/16   right femur 20Gy    Past Surgical History:  Procedure Laterality Date  . ABDOMINAL HYSTERECTOMY  02-21-1982  . BREAST BIOPSY  07-13-1997  . CARDIOVERSION N/A 05/27/2016   Procedure: CARDIOVERSION;  Surgeon: [name], MD;  Location: [place];  Service: Cardiovascular;  Laterality: N/A;  . DILATION AND CURETTAGE, DIAGNOSTIC / THERAPEUTIC  12-13-1981  . MASTECTOMY  07-29-1997  . PELVIC LAPAROSCOPY  11-06-1981  . POSTERIOR LUMBAR FUSION 4 LEVEL N/A 09/01/2015   Procedure: Thoracic six-Lumbar one fusion with arthrodesis pedicle screw stabilization and decompression;  Surgeon: Ashok Pall, MD;  Location: [facility]UR[facility] ORS;  Service: Neurosurgery;  Laterality: N/A;  T6-L1 fusion with arthrodesis pedicle screw stabilization and decompression  . TEE WITHOUT CARDIOVERSION N/A 05/27/2016   Procedure: TRANSESOPHAGEAL ECHOCARDIOGRAM (TEE);  Surgeon: [name], MD;  Location: [facility] ENDOSCOPY;  Service: Cardiovascular;  Laterality: N/A;  . THORACENTESIS  10-18-2008    Family History  Problem Relation Age of Onset  . Heart disease Mother        with pacemaker   . Asthma Father   . Heart attack Maternal Grandfather 64  . Diabetes Paternal Grandmother   . Aortic stenosis Maternal Uncle   . Prostate cancer Maternal Uncle        dx unspecified age  . Prostate cancer Maternal Uncle        dx older than 50y  . Prostate cancer Maternal Uncle        dx older than 50y  . Prostate cancer Maternal Uncle 72       s/p prostatectomy  . Breast cancer Cousin 21       maternal 1st cousin; s/p lumpectomy and radiation  . Breast cancer Cousin        maternal 1st cousin dx mid-late 66s; s/p mastectomy    Social History   Socioeconomic History  . Marital status: Single    Spouse name: Not on file  . Number of children: 0  . Years of education: Not on file  . Highest education level: Not on file  Occupational  History  . Occupation: Firefighter for [employer]  . Financial resource strain: Not on file  . Food insecurity:    Worry: Not on file    Inability: Not on file  . Transportation needs:    Medical: Not on file    Non-medical: Not on file  Tobacco Use  . Smoking status: Former Smoker    Packs/day: 0.50    Years: 30.00    Pack years: 15.00    Types: Cigarettes    Last attempt to quit: 07/22/1996    Years since quitting: 21.6  . Smokeless tobacco: Never Used  . Tobacco comment: 1/2 up to 1 ppd  Substance and Sexual Activity  . Alcohol use: No    Alcohol/week: 0.0 standard drinks    Comment: hx of alcohol socially - hasn't had drink since March 1998  .  Drug use: No  . Sexual activity: Not on file  Lifestyle  . Physical activity:    Days per week: Not on file    Minutes per session: Not on file  . Stress: Not on file  Relationships  . Social connections:    Talks on phone: Not on file    Gets together: Not on file    Attends religious service: Not on file    Active member of club or organization: Not on file    Attends meetings of clubs or organizations: Not on file    Relationship status: Not on file  . Intimate partner violence:    Fear of current or ex partner: Not on file    Emotionally abused: Not on file    Physically abused: Not on file    Forced sexual activity: Not on file  Other Topics Concern  . Not on file  Social History Narrative  . Not on file    Past Medical History, Surgical history, Social history, and Family history were reviewed and updated as appropriate.   Please see review of systems for further details on the patient's review from today.   Review of Systems:  Review of Systems  Constitutional: Negative for chills, diaphoresis and fever.  HENT: Negative for trouble swallowing.   Respiratory: Positive for chest tightness and shortness of breath. Negative for cough, choking, wheezing and stridor.   Cardiovascular:  Negative for chest pain and palpitations.  Neurological: Positive for weakness and headaches.    Objective:   Physical Exam:  BP (!) 120/98 (BP Location: Left Arm, Patient Position: Sitting) Comment: Notified Nurse of BP  Pulse 85   Temp 98.8 F (37.1 C) (Oral)   Resp 18   Ht 5\' 2"  (1.575 m)   Wt 178 lb (80.7 kg)   SpO2 99%   BMI 32.56 kg/m  ECOG: 1  Physical Exam  Constitutional: No distress.  HENT:  Head: Normocephalic and atraumatic.  Cardiovascular: An irregularly irregular rhythm present. Tachycardia present.  Pulmonary/Chest: Effort normal and breath sounds normal. No stridor. No respiratory distress. She has no wheezes. She has no rales.  Neurological: She is alert.  Skin: Skin is warm and dry. She is not diaphoretic. No erythema. No pallor.  Psychiatric: She has a normal mood and affect. Her behavior is normal. Judgment and thought content normal.    Lab Review:     Component Value Date/Time   NA 142 02/26/2018 1246   NA 139 06/23/2017 0930   K 4.5 02/26/2018 1246   K 3.9 06/23/2017 0930   CL 110 02/26/2018 1246   CL 108 (H) 01/13/2013 0806   CO2 21 (L) 02/26/2018 1246   CO2 21 (L) 06/23/2017 0930   GLUCOSE 105 (H) 02/26/2018 1246   GLUCOSE 82 06/23/2017 0930   GLUCOSE 101 (H) 01/13/2013 0806   BUN 25 (H) 02/26/2018 1246   BUN 22.2 06/23/2017 0930   CREATININE 1.21 (H) 02/26/2018 1246   CREATININE 1.0 06/23/2017 0930   CALCIUM 9.3 02/26/2018 1246   CALCIUM 9.1 06/23/2017 0930   PROT 6.8 02/26/2018 1246   PROT 7.1 06/23/2017 0930   ALBUMIN 3.8 02/26/2018 1246   ALBUMIN 3.9 06/23/2017 0930   AST 35 02/26/2018 1246   AST 22 06/23/2017 0930   ALT 26 02/26/2018 1246   ALT 13 06/23/2017 0930   ALKPHOS 81 02/26/2018 1246   ALKPHOS 74 06/23/2017 0930   BILITOT 1.1 02/26/2018 1246   BILITOT 0.69 06/23/2017 0930   GFRNONAA  43 (L) 02/26/2018 1246   GFRAA 50 (L) 02/26/2018 1246       Component Value Date/Time   WBC 2.9 (L) 02/26/2018 1246   WBC 2.9 (L)  01/15/2018 1114   RBC 3.52 (L) 02/26/2018 1246   HGB 12.2 02/26/2018 1246   HGB 12.0 06/23/2017 0930   HCT 36.2 02/26/2018 1246   HCT 35.0 06/23/2017 0930   PLT 179 02/26/2018 1246   PLT 182 06/23/2017 0930   MCV 102.7 (H) 02/26/2018 1246   MCV 103.3 (H) 06/23/2017 0930   MCH 34.7 (H) 02/26/2018 1246   MCHC 33.8 02/26/2018 1246   RDW 14.0 02/26/2018 1246   RDW 15.1 (H) 06/23/2017 0930   LYMPHSABS 0.8 (L) 02/26/2018 1246   LYMPHSABS 0.4 (L) 06/23/2017 0930   MONOABS 0.3 02/26/2018 1246   MONOABS 0.3 06/23/2017 0930   EOSABS 0.1 02/26/2018 1246   EOSABS 0.1 06/23/2017 0930   BASOSABS 0.1 02/26/2018 1246   BASOSABS 0.1 06/23/2017 0930   -------------------------------  Imaging from last 24 hours (if applicable):  Radiology interpretation: No results found.

## 2018-03-03 ENCOUNTER — Inpatient Hospital Stay: Payer: Medicare Other

## 2018-03-03 ENCOUNTER — Inpatient Hospital Stay: Payer: Medicare Other | Admitting: Hematology and Oncology

## 2018-03-03 ENCOUNTER — Telehealth: Payer: Self-pay | Admitting: Hematology and Oncology

## 2018-03-03 VITALS — BP 113/72 | HR 89 | Temp 99.1°F | Resp 17 | Ht 62.0 in | Wt 172.1 lb

## 2018-03-03 DIAGNOSIS — C782 Secondary malignant neoplasm of pleura: Secondary | ICD-10-CM

## 2018-03-03 DIAGNOSIS — C50911 Malignant neoplasm of unspecified site of right female breast: Secondary | ICD-10-CM

## 2018-03-03 DIAGNOSIS — G8929 Other chronic pain: Secondary | ICD-10-CM

## 2018-03-03 DIAGNOSIS — D701 Agranulocytosis secondary to cancer chemotherapy: Secondary | ICD-10-CM

## 2018-03-03 DIAGNOSIS — I480 Paroxysmal atrial fibrillation: Secondary | ICD-10-CM | POA: Diagnosis not present

## 2018-03-03 DIAGNOSIS — C7951 Secondary malignant neoplasm of bone: Secondary | ICD-10-CM | POA: Diagnosis not present

## 2018-03-03 DIAGNOSIS — T451X5A Adverse effect of antineoplastic and immunosuppressive drugs, initial encounter: Secondary | ICD-10-CM

## 2018-03-03 DIAGNOSIS — M549 Dorsalgia, unspecified: Secondary | ICD-10-CM

## 2018-03-03 DIAGNOSIS — C50919 Malignant neoplasm of unspecified site of unspecified female breast: Secondary | ICD-10-CM

## 2018-03-03 DIAGNOSIS — N183 Chronic kidney disease, stage 3 (moderate): Secondary | ICD-10-CM

## 2018-03-03 DIAGNOSIS — Z5111 Encounter for antineoplastic chemotherapy: Secondary | ICD-10-CM | POA: Diagnosis not present

## 2018-03-03 LAB — CMP (CANCER CENTER ONLY)
ALK PHOS: 80 U/L (ref 38–126)
ALT: 15 U/L (ref 0–44)
ANION GAP: 12 (ref 5–15)
AST: 22 U/L (ref 15–41)
Albumin: 3.9 g/dL (ref 3.5–5.0)
BILIRUBIN TOTAL: 1.1 mg/dL (ref 0.3–1.2)
BUN: 18 mg/dL (ref 8–23)
CALCIUM: 8.9 mg/dL (ref 8.9–10.3)
CO2: 21 mmol/L — ABNORMAL LOW (ref 22–32)
Chloride: 109 mmol/L (ref 98–111)
Creatinine: 1.11 mg/dL — ABNORMAL HIGH (ref 0.44–1.00)
GFR, EST AFRICAN AMERICAN: 55 mL/min — AB (ref >60)
GFR, EST NON AFRICAN AMERICAN: 48 mL/min — AB (ref >60)
Glucose, Bld: 97 mg/dL (ref 70–99)
Potassium: 3.9 mmol/L (ref 3.5–5.1)
SODIUM: 142 mmol/L (ref 135–145)
TOTAL PROTEIN: 6.8 g/dL (ref 6.5–8.1)

## 2018-03-03 LAB — CBC WITH DIFFERENTIAL (CANCER CENTER ONLY)
BASOS ABS: 0.1 10*3/uL (ref 0.0–0.1)
BASOS PCT: 3 %
Eosinophils Absolute: 0.1 10*3/uL (ref 0.0–0.5)
Eosinophils Relative: 3 %
HCT: 38.1 % (ref 34.8–46.6)
HEMOGLOBIN: 12.9 g/dL (ref 11.6–15.9)
Lymphocytes Relative: 29 %
Lymphs Abs: 0.7 10*3/uL — ABNORMAL LOW (ref 0.9–3.3)
MCH: 34.4 pg — ABNORMAL HIGH (ref 25.1–34.0)
MCHC: 33.9 g/dL (ref 31.5–36.0)
MCV: 101.6 fL — ABNORMAL HIGH (ref 79.5–101.0)
Monocytes Absolute: 0.4 10*3/uL (ref 0.1–0.9)
Monocytes Relative: 18 %
NEUTROS PCT: 47 %
Neutro Abs: 1.2 10*3/uL — ABNORMAL LOW (ref 1.5–6.5)
Platelet Count: 170 10*3/uL (ref 145–400)
RBC: 3.75 MIL/uL (ref 3.70–5.45)
RDW: 14.6 % — AB (ref 11.2–14.5)
WBC: 2.4 10*3/uL — AB (ref 3.9–10.3)

## 2018-03-03 MED ORDER — FULVESTRANT 250 MG/5ML IM SOLN
500.0000 mg | Freq: Once | INTRAMUSCULAR | Status: AC
  Administered 2018-03-03: 500 mg via INTRAMUSCULAR

## 2018-03-03 MED ORDER — FULVESTRANT 250 MG/5ML IM SOLN
INTRAMUSCULAR | Status: AC
  Filled 2018-03-03: qty 10

## 2018-03-03 NOTE — Assessment & Plan Note (Signed)
Metastatic breast cancer tolungs and bone: Clinically improving onweekly taxol beginning 05-09-16, several delays including for counts, rapid A fib, respiratory infection etc  Treatment summary: Taxol days 1 and 8 every 3 weeks with Neupogen along with Xgeva; completed 4cycles from 05/09/2016-08/29/2016 Taxol toxicities: Neutropenia: Requiring Neupogen with each treatment  Atrial fibrillation: Recurrent with RVR, cardioverted 07/25/2016, amiodarone, metoprolol and Lasix for CHF (Dr. Sallyanne Kuster) CKD stage III Pathologic compression fracture T10 status post radiation and laminectomy T6-L1 09/01/2015, radiation to right iliac area 05/2016  Rebiopsy of the lung nodule 09/19/2016: Metastatic breast cancer that is HER-2 negative, -------------------------------------------------------------------------------------------------------------------------------------------- Current treatment: LetrozolewithPalbociclib started 10/01/2016 switched to Faslodex with Ibrance 02/03/2018  Palbociclib toxicities: 1. Neutropenia: Currently Palbociclib dose is 100 mg daily. Bone metastases: She will receive Xgeva today and will receive it every 3 months.  CT chest abdomen pelvis6/13/2019:3 new hypodense lesions in the liver 1.5 cm and 0.7 cm concerning for possible new metastatic lesions although nonspecific, stable bone metastases. Stable pleural lesions.  Liver biopsy: 01/15/2018: Positive for metastatic breast cancer HER-2 negative, ER 100%, PR 90%  Paroxysmal atrial fibrillation causing shortness of breath Fosamax toxicities: Joint pains  Return to clinic 1 month for follow-up

## 2018-03-03 NOTE — Telephone Encounter (Signed)
Gave patient avs report and appointments for September thru November  °

## 2018-03-03 NOTE — Progress Notes (Signed)
Patient Care Team: [name], MD as PCP - General (Internal Medicine)  DIAGNOSIS:  Encounter Diagnoses  Name Primary?  . Paroxysmal atrial fibrillation ([place]) Yes  . 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 ([place])     SUMMARY OF ONCOLOGIC HISTORY:   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 [facility])   1998 Initial Diagnosis    Right breast carcinoma T2N1 ER/PR positive at right mastectomy with node evaluation Dec 1998, treated with adjuvant adriamycin cytoxan followed by 5 years of tamoxifen    2010 Relapse/Recurrence    Metastatic breast cancer to the pleura    08/29/2008 - 08/30/2015 Anti-estrogen oral therapy    Aromasin followed by letrozole    07/2015 Relapse/Recurrence    Progression to bone required aggressive laminectomy February 2017    08/30/2015 - 09/13/2015 Radiation Therapy    Palliative radiation to the spine,  Right ilium ad right hip    01/05/2016 - 04/29/2016 Chemotherapy    Xeloda stopped when she had progression of dsease     05/02/2016 PET scan     innumerable hypermetabolic lesions throughout the axial and appendicular skeleton, increase in size and number of new lesions in skull, right scapular glenoid, throughout sacrum. Findings concerning for liver metastases, numerous pulmonary nodules bilaterally    05/09/2016 - 09/18/2016 Chemotherapy    Taxol weekly initially and later changed her day 1 day 8 every 3 weeks (complicated by A. fib with RVR and prolonged respiratory infection) along with Xgeva     09/19/2016 Procedure    Biopsy of lung mass: Metastatic carcinoma consistent with breast origin, HER-2 negative ratio 1.37    10/01/2016 -  Anti-estrogen oral therapy    Palbociclib and Letrozole    06/23/2017 Imaging    CT chest abdomen pelvis: No evidence of progression of metastatic disease, bone metastases stable right-sided pleural metastases and pleural effusions are stable     CHIEF COMPLIANT:  Follow-up after recent problems with atrial fibrillation  INTERVAL HISTORY: Lisa [name] is a 7-year with above-mentioned metastatic breast cancer currently on palbociclib with Faslodex patient appears to be tolerating extremely well.  She complained of diffuse muscle aches and pains as result of Fosamax.  She denies any fevers or chills.  She was noted to have paroxysmal atrial fibrillation with heart rate in the 140s and she was referred to cardiology put her on diltiazem and her heart rate had slowed down.  She is feeling a lot better than last week.  REVIEW OF SYSTEMS:   Constitutional: Denies fevers, chills or abnormal weight loss Eyes: Denies blurriness of vision Ears, nose, mouth, throat, and face: Denies mucositis or sore throat Respiratory: Denies cough, dyspnea or wheezes Cardiovascular: Paroxysmal atrial fibrillation Gastrointestinal:  Denies nausea, heartburn or change in bowel habits Skin: Denies abnormal skin rashes Lymphatics: Denies new lymphadenopathy or easy bruising Neurological:Denies numbness, tingling or new weaknesses Behavioral/Psych: Mood is stable, no new changes  Extremities: No lower extremity edema   All other systems were reviewed with the patient and are negative.  I have reviewed the past medical history, past surgical history, social history and family history with the patient and they are unchanged from previous note.  ALLERGIES:  is allergic to diphenhydramine hcl; codeine sulfate; metoprolol; oseltamivir phosphate; oxycodone-acetaminophen; prednisone; and sudafed [pseudoephedrine hcl].  MEDICATIONS:  Current Outpatient Medications  Medication Sig Dispense Refill  . acetaminophen (TYLENOL) 500 MG tablet Take 500 mg by mouth every 6 (six)  hours as needed for moderate pain.    [name] denosumab (PROLIA) 60 MG/ML SOSY injection Inject 60 mg into the skin every 3 (three) months.    . diltiazem (CARDIZEM CD) 120 MG 24 hr capsule Take 1 capsule (120 mg total)  by mouth daily. 30 capsule 1  . ELIQUIS 5 MG TABS tablet TAKE 1 TABLET BY MOUTH TWICE DAILY 180 tablet 1  . fulvestrant (FASLODEX) 250 MG/5ML injection Inject into the muscle once. One injection each buttock over 1-2 minutes. Warm prior to use.    . furosemide (LASIX) 40 MG tablet TAKE 1 TABLET (40 MG TOTAL) BY MOUTH DAILY. (Patient taking differently: Take 20 mg by mouth daily. Take 20m 3 times a week and an extra 20 mg if needed.) 90 tablet 2  . loratadine (CLARITIN) 10 MG tablet Take 10 mg by mouth daily.     .Marland Kitchenlosartan (COZAAR) 50 MG tablet Take 100 mg daily by mouth.  2  . Multiple Vitamins-Minerals (PRESERVISION AREDS 2+MULTI VIT PO) Take by mouth.    . palbociclib (IBRANCE) 100 MG capsule TAKE 1 CAPSULE BY MOUTH ONCE DAILY WITH BREAKFAST. TAKE WHOLE WITH FOOD. Take for 3 weeks on; 1 week off--alternating. 21 capsule 3  . Potassium 99 MG TABS Take 2 tablets 3 (three) times a week by mouth. Mondays, Wednesdays, and Fridays    . pravastatin (PRAVACHOL) 40 MG tablet Take 1 tablet (40 mg total) by mouth every evening. 90 tablet 3  . vitamin B-12 (CYANOCOBALAMIN) 1000 MCG tablet Take 1,000 mcg by mouth 2 (two) times daily.    . vitamin C (ASCORBIC ACID) 500 MG tablet Take 500 mg by mouth daily.      . vitamin E 200 UNIT capsule Take 200 Units by mouth daily.       No current facility-administered medications for this visit.     PHYSICAL EXAMINATION: ECOG PERFORMANCE STATUS: 1 - Symptomatic but completely ambulatory  Vitals:   03/03/18 1402  BP: 113/72  Pulse: 89  Resp: 17  Temp: 99.1 F (37.3 C)  SpO2: 99%   Filed Weights   03/03/18 1402  Weight: 172 lb 1.6 oz (78.1 kg)    GENERAL:alert, no distress and comfortable SKIN: skin color, texture, turgor are normal, no rashes or significant lesions EYES: normal, Conjunctiva are pink and non-injected, sclera clear OROPHARYNX:no exudate, no erythema and lips, buccal mucosa, and tongue normal  NECK: supple, thyroid normal size,  non-tender, without nodularity LYMPH:  no palpable lymphadenopathy in the cervical, axillary or inguinal LUNGS: clear to auscultation and percussion with normal breathing effort HEART: regular rate & rhythm and no murmurs and no lower extremity edema ABDOMEN:abdomen soft, non-tender and normal bowel sounds MUSCULOSKELETAL:no cyanosis of digits and no clubbing  NEURO: alert & oriented x 3 with fluent speech, no focal motor/sensory deficits EXTREMITIES: No lower extremity edema   LABORATORY DATA:  I have reviewed the data as listed CMP Latest Ref Rng & Units 02/26/2018 12/29/2017 09/23/2017  Glucose 70 - 99 mg/dL 105(H) 89 95  BUN 8 - 23 mg/dL 25(H) 16 29(H)  Creatinine 0.44 - 1.00 mg/dL 1.21(H) 1.00 1.08  Sodium 135 - 145 mmol/L 142 140 139  Potassium 3.5 - 5.1 mmol/L 4.5 3.7 4.1  Chloride 98 - 111 mmol/L 110 108 107  CO2 22 - 32 mmol/L 21(L) 23 22  Calcium 8.9 - 10.3 mg/dL 9.3 9.0 10.0  Total Protein 6.5 - 8.1 g/dL 6.8 7.2 7.1  Total Bilirubin 0.3 - 1.2 mg/dL 1.1 1.0  0.7  Alkaline Phos 38 - 126 U/L 81 70 67  AST 15 - 41 U/L 35 24 26  ALT 0 - 44 U/L 26 13 18     Lab Results  Component Value Date   WBC 2.4 (L) 03/03/2018   HGB 12.9 03/03/2018   HCT 38.1 03/03/2018   MCV 101.6 (H) 03/03/2018   PLT 170 03/03/2018   NEUTROABS 1.2 (L) 03/03/2018    ASSESSMENT & PLAN:  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 [facility]) Metastatic breast cancer tolungs and bone: Clinically improving onweekly taxol beginning 05-09-16, several delays including for counts, rapid A fib, respiratory infection etc  Treatment summary: Taxol days 1 and 8 every 3 weeks with Neupogen along with Xgeva; completed 4cycles from 05/09/2016-08/29/2016 Taxol toxicities: Neutropenia: Requiring Neupogen with each treatment  Atrial fibrillation: Recurrent with RVR, cardioverted 07/25/2016, amiodarone, metoprolol and Lasix for CHF (Dr. Sallyanne Kuster) CKD stage III Pathologic compression fracture T10 status post radiation and  laminectomy T6-L1 09/01/2015, radiation to right iliac area 05/2016  Rebiopsy of the lung nodule 09/19/2016: Metastatic breast cancer that is HER-2 negative, -------------------------------------------------------------------------------------------------------------------------------------------- Current treatment: LetrozolewithPalbociclib started 10/01/2016 switched to Faslodex with Ibrance 02/03/2018  Palbociclib toxicities: 1. Neutropenia: Currently Palbociclib dose is 100 mg daily. Bone metastases: Xgeva every 3 months.  CT chest abdomen pelvis6/13/2019:3 new hypodense lesions in the liver 1.5 cm and 0.7 cm concerning for possible new metastatic lesions although nonspecific, stable bone metastases. Stable pleural lesions.  Liver biopsy: 01/15/2018: Positive for metastatic breast cancer HER-2 negative, ER 100%, PR 90%  Paroxysmal atrial fibrillation causing shortness of breath: She is doing much better since A. fib is been controlled Faslodex toxicities: Joint pains Patient attributes her joint and muscle aches to both  Faslodex.  But she is willing to continue with it because it is helping her.  Return to clinic every 3 months for follow-up to see me in monthly for Faslodex injections.    No orders of the defined types were placed in this encounter.  The patient has a good understanding of the overall plan. she agrees with it. she will call with any problems that may develop before the next visit here.   Harriette Ohara, MD 03/03/18

## 2018-03-20 MED FILL — IBRANCE 100 MG CAPSULE: 100 | 28 days supply | Qty: 21 | Fill #2

## 2018-03-25 MED FILL — CARTIA XT 120 MG CAPSULE SA: 120 | 30 days supply | Qty: 30 | Fill #1

## 2018-03-31 ENCOUNTER — Inpatient Hospital Stay: Payer: Medicare Other | Attending: Hematology and Oncology

## 2018-03-31 ENCOUNTER — Inpatient Hospital Stay: Payer: Medicare Other

## 2018-03-31 ENCOUNTER — Other Ambulatory Visit: Payer: Self-pay

## 2018-03-31 DIAGNOSIS — C7951 Secondary malignant neoplasm of bone: Secondary | ICD-10-CM | POA: Insufficient documentation

## 2018-03-31 DIAGNOSIS — C50911 Malignant neoplasm of unspecified site of right female breast: Secondary | ICD-10-CM

## 2018-03-31 DIAGNOSIS — C782 Secondary malignant neoplasm of pleura: Principal | ICD-10-CM

## 2018-03-31 DIAGNOSIS — I480 Paroxysmal atrial fibrillation: Secondary | ICD-10-CM | POA: Diagnosis not present

## 2018-03-31 DIAGNOSIS — D701 Agranulocytosis secondary to cancer chemotherapy: Secondary | ICD-10-CM

## 2018-03-31 DIAGNOSIS — C50919 Malignant neoplasm of unspecified site of unspecified female breast: Secondary | ICD-10-CM

## 2018-03-31 DIAGNOSIS — T451X5A Adverse effect of antineoplastic and immunosuppressive drugs, initial encounter: Secondary | ICD-10-CM

## 2018-03-31 LAB — CMP (CANCER CENTER ONLY)
ALBUMIN: 3.8 g/dL (ref 3.5–5.0)
ALK PHOS: 83 U/L (ref 38–126)
ALT: 22 U/L (ref 0–44)
ANION GAP: 10 (ref 5–15)
AST: 27 U/L (ref 15–41)
BILIRUBIN TOTAL: 1.2 mg/dL (ref 0.3–1.2)
BUN: 19 mg/dL (ref 8–23)
CO2: 22 mmol/L (ref 22–32)
Calcium: 9.2 mg/dL (ref 8.9–10.3)
Chloride: 109 mmol/L (ref 98–111)
Creatinine: 1.08 mg/dL — ABNORMAL HIGH (ref 0.44–1.00)
GFR, EST AFRICAN AMERICAN: 57 mL/min — AB (ref >60)
GFR, Estimated: 49 mL/min — ABNORMAL LOW (ref >60)
GLUCOSE: 94 mg/dL (ref 70–99)
POTASSIUM: 3.4 mmol/L — AB (ref 3.5–5.1)
SODIUM: 141 mmol/L (ref 135–145)
TOTAL PROTEIN: 6.7 g/dL (ref 6.5–8.1)

## 2018-03-31 MED ORDER — FULVESTRANT 250 MG/5ML IM SOLN: 500.0000 mg | Freq: Once | INTRAMUSCULAR | Status: DC

## 2018-03-31 MED ORDER — DENOSUMAB 120 MG/1.7ML ~~LOC~~ SOLN: 120.0000 mg | Freq: Once | SUBCUTANEOUS | Status: DC

## 2018-04-20 ENCOUNTER — Encounter

## 2018-04-20 ENCOUNTER — Ambulatory Visit: Payer: Medicare Other | Admitting: Cardiovascular Disease

## 2018-04-20 ENCOUNTER — Encounter: Payer: Self-pay | Admitting: Cardiovascular Disease

## 2018-04-20 VITALS — BP 129/90 | HR 80 | Ht 62.0 in | Wt 181.0 lb

## 2018-04-20 DIAGNOSIS — Z7901 Long term (current) use of anticoagulants: Secondary | ICD-10-CM | POA: Diagnosis not present

## 2018-04-20 DIAGNOSIS — I5032 Chronic diastolic (congestive) heart failure: Secondary | ICD-10-CM | POA: Diagnosis not present

## 2018-04-20 DIAGNOSIS — I480 Paroxysmal atrial fibrillation: Secondary | ICD-10-CM

## 2018-04-20 DIAGNOSIS — I313 Pericardial effusion (noninflammatory): Secondary | ICD-10-CM

## 2018-04-20 DIAGNOSIS — I10 Essential (primary) hypertension: Secondary | ICD-10-CM

## 2018-04-20 DIAGNOSIS — E7800 Pure hypercholesterolemia, unspecified: Secondary | ICD-10-CM

## 2018-04-20 DIAGNOSIS — I3139 Other pericardial effusion (noninflammatory): Secondary | ICD-10-CM

## 2018-04-20 MED ORDER — AMIODARONE HCL 200 MG PO TABS: ORAL_TABLET | ORAL | 3 refills | Status: DC

## 2018-04-20 MED FILL — AMIODARONE HCL 200 MG TAB: 200 | 90 days supply | Qty: 120 | Fill #0

## 2018-04-20 NOTE — Patient Instructions (Signed)
Medication Instructions: Dr Sallyanne Kuster has recommended making the following medication changes: 1. START Amiodarone 200 mg - take 2 tablets (400 mg total) daily for 1 month, THEN 1 tablet (200 mg total) daily.  Labwork: Your physician recommends that you return for lab work at your convenience.  Testing/Procedures: NONE ORDERED  Follow-up: Your physician recommends that you schedule a follow-up appointment in 2-3 weeks with Roderic Palau in the A-Fib Clinic.  Dr Sallyanne Kuster recommends that you schedule a follow-up appointment in 3 months.  If you need a refill on your cardiac medications before your next appointment, please call your pharmacy.

## 2018-04-21 ENCOUNTER — Encounter: Payer: Self-pay | Admitting: Cardiovascular Disease

## 2018-04-21 LAB — BASIC METABOLIC PANEL
BUN / CREAT RATIO: 17 (ref 12–28)
BUN: 18 mg/dL (ref 8–27)
CO2: 20 mmol/L (ref 20–29)
Calcium: 9 mg/dL (ref 8.7–10.3)
Chloride: 104 mmol/L (ref 96–106)
Creatinine, Ser: 1.09 mg/dL — ABNORMAL HIGH (ref 0.57–1.00)
GFR, EST AFRICAN AMERICAN: 58 mL/min/{1.73_m2} — AB (ref >59)
GFR, EST NON AFRICAN AMERICAN: 50 mL/min/{1.73_m2} — AB (ref >59)
Glucose: 103 mg/dL — ABNORMAL HIGH (ref 65–99)
Potassium: 4.1 mmol/L (ref 3.5–5.2)
Sodium: 141 mmol/L (ref 134–144)

## 2018-04-21 LAB — TSH: TSH: 3.71 u[IU]/mL (ref 0.450–4.500)

## 2018-04-21 NOTE — Progress Notes (Signed)
Dictation #1 [id number]  [id number]    Cardiology Office Note    Date:  04/21/2018   ID:  Lisa [name], DOB [birth year], MRN [id number]  PCP:  [name], MD  Cardiologist:   [name], MD   Chief Complaint  Patient presents with  . Atrial Fibrillation    History of Present Illness:  Lisa [name] is a 77 y.o. female with paroxysmal atrial fibrillation that led to decompensated heart failure in late 2017, cardioversion 05/27/2016, with recurrence and a successful cardioversion in ED 08/18/2016.    Mother passed away, causing of a lot of emotional upset. Atrial fibrillation has recurred, symptomatic when she had rapid ventricular response, better after taking diltiazem. She saw Roderic Palau in AFib clinic on August 9, and was back in sinus rhythm with lots of ectopy.   The complaints have been of "not feeling right" and she has developed lower extremity edema.  She is not immediately aware of the palpitations.  Today she is back in atrial fibrillation with controlled ventricular rate at 80 bpm.  Her ECG showed some inferolateral ST-T-segment changes, nonspecific. QTc interval is 467 ms  Amiodarone has been stopped about a year ago.  She is currently well rate controlled on the current dose of diltiazem and is on Eliquis anticoagulation.  In the past she did not tolerate beta-blockers due to bradycardia.  She has widely metastatic breast cancer involving the pleura and bone (with good response to treatment with palbociclib, stable disease), hypertension, hyperlipidemia, stage II chronic kidney disease, remote history of smoking.  The patient specifically denies any chest pain at rest exertion, dyspnea at rest or with exertion, orthopnea, paroxysmal nocturnal dyspnea, syncope, palpitations, focal neurological deficits, intermittent claudication, unexplained weight gain, cough, hemoptysis or wheezing.  She has bilateral ankle edema which is always a little  asymmetrical, worse on the right.  She has normal left ventricular systolic function and mild biatrial dilation and mild to moderate pulmonary artery hypertension by echo. She has evidence of coronary artery calcification, but has not undergone angiography or nuclear stress testing.  She has never had angina pectoris.  She is not receiving aspirin due to full anticoagulation with Eliquis.  She takes pravastatin for hyperlipidemia.  Past Medical History:  Diagnosis Date  . Breast cancer ([place]) 07-13-1997   right  . Chronic back pain   . CKD (chronic kidney disease), stage III ([place])   . Complication of anesthesia   . Coronary artery calcification of native artery    a. seen on PET scan 04/2016.  [name] Hypertension   . Pericardial effusion    a. small by TEE 05/2016.  [name] Persistent atrial fibrillation   . Pleural effusion 11-08-2008  . PONV (postoperative nausea and vomiting)   . Radiation 08/17/2015-08/29/2015   lower thoracic spine 22.5 gray  . Radiation 01/17/16-01/24/16   right femur 20Gy    Past Surgical History:  Procedure Laterality Date  . ABDOMINAL HYSTERECTOMY  02-21-1982  . BREAST BIOPSY  07-13-1997  . CARDIOVERSION N/A 05/27/2016   Procedure: CARDIOVERSION;  Surgeon: [name], MD;  Location: [facility];  Service: Cardiovascular;  Laterality: N/A;  . DILATION AND CURETTAGE, DIAGNOSTIC / THERAPEUTIC  12-13-1981  . MASTECTOMY  07-29-1997  . PELVIC LAPAROSCOPY  11-06-1981  . POSTERIOR LUMBAR FUSION 4 LEVEL N/A 09/01/2015   Procedure: Thoracic six-Lumbar one fusion with arthrodesis pedicle screw stabilization and decompression;  Surgeon: Ashok Pall, MD;  Location: [facility];  Service: Neurosurgery;  Laterality: N/A;  T6-L1 fusion  with arthrodesis pedicle screw stabilization and decompression  . TEE WITHOUT CARDIOVERSION N/A 05/27/2016   Procedure: TRANSESOPHAGEAL ECHOCARDIOGRAM (TEE);  Surgeon: [name], MD;  [facility];  Service: Cardiovascular;  Laterality: N/A;    . THORACENTESIS  10-18-2008    Current Medications: Outpatient Medications Prior to Visit  Medication Sig Dispense Refill  . acetaminophen (TYLENOL) 500 MG tablet Take 500 mg by mouth every 6 (six) hours as needed for moderate pain.    [name] denosumab (PROLIA) 60 MG/ML SOSY injection Inject 60 mg into the skin every 3 (three) months.    . diltiazem (CARDIZEM CD) 120 MG 24 hr capsule Take 1 capsule (120 mg total) by mouth daily. 30 capsule 1  . ELIQUIS 5 MG TABS tablet TAKE 1 TABLET BY MOUTH TWICE DAILY 180 tablet 1  . fulvestrant (FASLODEX) 250 MG/5ML injection Inject into the muscle every 30 (thirty) days. One injection each buttock over 1-2 minutes. Warm prior to use.     . furosemide (LASIX) 40 MG tablet TAKE 1 TABLET (40 MG TOTAL) BY MOUTH DAILY. (Patient taking differently: Take 20 mg by mouth daily. Take 20mg  3 times a week and an extra 20 mg if needed.) 90 tablet 2  . loratadine (CLARITIN) 10 MG tablet Take 10 mg by mouth daily.     [name] losartan (COZAAR) 50 MG tablet Take 100 mg daily by mouth.  2  . Multiple Vitamins-Minerals (PRESERVISION AREDS 2+MULTI VIT PO) Take 2 tablets by mouth daily.    . palbociclib (IBRANCE) 100 MG capsule TAKE 1 CAPSULE BY MOUTH ONCE DAILY WITH BREAKFAST. TAKE WHOLE WITH FOOD. Take for 3 weeks on; 1 week off--alternating. 21 capsule 3  . Potassium 99 MG TABS Take 4 tablets by mouth daily.     . vitamin B-12 (CYANOCOBALAMIN) 1000 MCG tablet Take 1,000 mcg by mouth 2 (two) times daily.    . vitamin C (ASCORBIC ACID) 500 MG tablet Take 500 mg by mouth daily.      . vitamin E 200 UNIT capsule Take 200 Units by mouth daily.      . pravastatin (PRAVACHOL) 40 MG tablet Take 1 tablet (40 mg total) by mouth every evening. 90 tablet 3  . Multiple Vitamins-Minerals (PRESERVISION AREDS 2+MULTI VIT PO) Take by mouth.     No facility-administered medications prior to visit.      Allergies:   Diphenhydramine hcl; Codeine sulfate; Metoprolol; Oseltamivir phosphate;  Oxycodone-acetaminophen; Prednisone; and Sudafed [pseudoephedrine hcl]   Social History   Socioeconomic History  . Marital status: Single    Spouse name: Not on file  . Number of children: 0  . Years of education: Not on file  . Highest education level: Not on file  Occupational History  . Occupation: Firefighter for [employer]  . Financial resource strain: Not on file  . Food insecurity:    Worry: Not on file    Inability: Not on file  . Transportation needs:    Medical: Not on file    Non-medical: Not on file  Tobacco Use  . Smoking status: Former Smoker    Packs/day: 0.50    Years: 30.00    Pack years: 15.00    Types: Cigarettes    Last attempt to quit: 07/22/1996    Years since quitting: 21.7  . Smokeless tobacco: Never Used  . Tobacco comment: 1/2 up to 1 ppd  Substance and Sexual Activity  . Alcohol use: No    Alcohol/week: 0.0 standard drinks  Comment: hx of alcohol socially - hasn't had drink since March 1998  . Drug use: No  . Sexual activity: Not on file  Lifestyle  . Physical activity:    Days per week: Not on file    Minutes per session: Not on file  . Stress: Not on file  Relationships  . Social connections:    Talks on phone: Not on file    Gets together: Not on file    Attends religious service: Not on file    Active member of club or organization: Not on file    Attends meetings of clubs or organizations: Not on file    Relationship status: Not on file  Other Topics Concern  . Not on file  Social History Narrative  . Not on file     Family History:  The patient's family history includes Aortic stenosis in her maternal uncle; Asthma in her father; Breast cancer in her cousin; Breast cancer (age of onset: 47) in her cousin; Diabetes in her paternal grandmother; Heart attack (age of onset: 41) in her maternal grandfather; Heart disease in her mother; Prostate cancer in her maternal uncle, maternal uncle, and maternal  uncle; Prostate cancer (age of onset: 69) in her maternal uncle.   ROS:   Please see the history of present illness.    ROS All other systems reviewed and are negative.   PHYSICAL EXAM:   VS:  BP 129/90 (BP Location: Left Arm, Patient Position: Sitting, Cuff Size: Normal)   Pulse 80   Ht 5\' 2"  (1.575 m)   Wt 181 lb (82.1 kg)   BMI 33.11 kg/m       General: Alert, oriented x3, no distress, appears well Head: no evidence of trauma, PERRL, EOMI, no exophtalmos or lid lag, no myxedema, no xanthelasma; normal ears, nose and oropharynx Neck: normal jugular venous pulsations and no hepatojugular reflux; brisk carotid pulses without delay and no carotid bruits Chest: clear to auscultation, no signs of consolidation by percussion or palpation, normal fremitus, symmetrical and full respiratory excursions Cardiovascular: normal position and quality of the apical impulse, irregular rhythm, normal first and second heart sounds, no murmurs, rubs or gallops Abdomen: no tenderness or distention, no masses by palpation, no abnormal pulsatility or arterial bruits, normal bowel sounds, no hepatosplenomegaly Extremities: no clubbing, cyanosis or edema; 2+ radial, ulnar and brachial pulses bilaterally; 2+ right femoral, posterior tibial and dorsalis pedis pulses; 2+ left femoral, posterior tibial and dorsalis pedis pulses; no subclavian or femoral bruits Neurological: grossly nonfocal Psych: Normal mood and affect  Wt Readings from Last 3 Encounters:  04/20/18 181 lb (82.1 kg)  03/03/18 172 lb 1.6 oz (78.1 kg)  02/27/18 176 lb (79.8 kg)      Studies/Labs Reviewed:   EKG:  EKG is ordered today.  It shows normal atrial fibrillation with controlled ventricular response, nonspecific inferolateral ST segment changes, QTC 467 ms  Recent Labs: 03/03/2018: Hemoglobin 12.9; Platelet Count 170 03/31/2018: ALT 22 04/20/2018: BUN 18; Creatinine, Ser 1.09; Potassium 4.1; Sodium 141; TSH 3.710   Lipid Panel      Component Value Date/Time   CHOL 134 05/23/2016 0304   CHOL 133 11/09/2015 1104   TRIG 247 (H) 05/23/2016 0304   TRIG 238 (H) 11/09/2015 1104   HDL 31 (L) 05/23/2016 0304   HDL 43 11/09/2015 1104   CHOLHDL 4.3 05/23/2016 0304   VLDL 49 (H) 05/23/2016 0304   LDLCALC 54 05/23/2016 0304   LDLCALC 42 11/09/2015 1104  LDLDIRECT 75.4 06/27/2008 0931    Additional studies/ records that were reviewed today include:  Records from recent visits in Oncology clinic and AFib clinic  ASSESSMENT:    1. Paroxysmal atrial fibrillation (HCC)   2. Chronic diastolic CHF (congestive heart failure) ([place])   3. Chronic anticoagulation   4. Essential hypertension   5. Pericardial effusion - small by TEE 05/27/16   6. Hypercholesterolemia      PLAN:  In order of problems listed above:   1. AFib: She successfully maintained sinus rhythm without amiodarone for almost a year, but now has recurrent arrhythmia, possibly related to emotional distress.  She seems to have recurrent episodes of paroxysmal atrial fibrillation, not persistent atrial fibrillation in the past ventricular rate control was difficult to achieve, possibly due to intercurrent illness, today she is doing quite well on diltiazem.  Intolerant to beta-blockers in the past. CHADSVasc 3 (age, gender, HTN), on Eliquis.  I think is likely she will again develop worsening congestive heart failure if she remains in atrial fibrillation and have recommended antiarrhythmic therapy.  Discussed the option for hospitalization to start dofetilide (potentially low or long-term side effect profile) versus giving her prescription for amiodarone, which has worked well for her in the past but which associates high risk of side effect.  She immediately told me she does not want to go into the hospital and prefers to take the amiodarone. 2. CHF: She does not have overt symptoms of shortness of breath but does have lower extremity edema.  Continue diuretics.   Hopefully will see better hemodynamic compensation with maintenance of normal rhythm 3. Eliquis: Occasional minor bleeding problems, no serious events.  Continue full anticoagulation 4. Hx of Pericardial effusion: Small pericardial effusion when she initially presented with atrial fibrillation.  Follow-up echo in May 2018 showed resolution of effusion.  This makes it very unlikely that it was neoplastic.  However, she does have documented pleural involvement with breast cancer.  Currently no clinical signs to suggest pericardial effusion or tamponade.  May have to consider repeat echocardiogram if she develops any signs of pericardial effusion, worsening heart failure, etc. 5. HTN: fair control on losartan.  Diastolic blood pressure was reported rather high at 90, when I rechecked it is much better at 81 mmHg. 6. HLP: on statin.  She has known coronary arthrosclerosis by the presence of calcium on CT of the chest, but has no angina.  LDL is at target, less than 7o.     Medication Adjustments/Labs and Tests Ordered: Current medicines are reviewed at length with the patient today.  Concerns regarding medicines are outlined above.  Medication changes, Labs and Tests ordered today are listed in the Patient Instructions below. Patient Instructions  Medication Instructions: Dr Sallyanne Kuster has recommended making the following medication changes: 1. START Amiodarone 200 mg - take 2 tablets (400 mg total) daily for 1 month, THEN 1 tablet (200 mg total) daily.  Labwork: Your physician recommends that you return for lab work at your convenience.  Testing/Procedures: NONE ORDERED  Follow-up: Your physician recommends that you schedule a follow-up appointment in 2-3 weeks with Roderic Palau in the A-Fib Clinic.  Dr Sallyanne Kuster recommends that you schedule a follow-up appointment in 3 months.  If you need a refill on your cardiac medications before your next appointment, please call your pharmacy.     Signed, [name], MD  04/21/2018 7:56 PM    [facility] [address], [place], [place]  [address]  Phone: [phone]; Fax: [phone]

## 2018-04-27 ENCOUNTER — Other Ambulatory Visit (HOSPITAL_COMMUNITY): Payer: Self-pay | Admitting: Nurse Practitioner

## 2018-04-27 MED FILL — CARTIA XT 120 MG CAPSULE: 120 | 30 days supply | Qty: 30 | Fill #0

## 2018-04-27 MED FILL — IBRANCE 100 MG CAPSULE: 100 | 28 days supply | Qty: 21 | Fill #3

## 2018-04-28 ENCOUNTER — Inpatient Hospital Stay: Payer: Medicare Other | Attending: Hematology and Oncology

## 2018-04-28 VITALS — BP 127/60 | HR 81 | Temp 98.4°F | Resp 18

## 2018-04-28 DIAGNOSIS — C782 Secondary malignant neoplasm of pleura: Secondary | ICD-10-CM | POA: Insufficient documentation

## 2018-04-28 DIAGNOSIS — Z5111 Encounter for antineoplastic chemotherapy: Secondary | ICD-10-CM | POA: Insufficient documentation

## 2018-04-28 DIAGNOSIS — I480 Paroxysmal atrial fibrillation: Secondary | ICD-10-CM | POA: Diagnosis not present

## 2018-04-28 DIAGNOSIS — C7800 Secondary malignant neoplasm of unspecified lung: Secondary | ICD-10-CM | POA: Diagnosis not present

## 2018-04-28 DIAGNOSIS — D701 Agranulocytosis secondary to cancer chemotherapy: Secondary | ICD-10-CM

## 2018-04-28 DIAGNOSIS — C50911 Malignant neoplasm of unspecified site of right female breast: Secondary | ICD-10-CM | POA: Insufficient documentation

## 2018-04-28 DIAGNOSIS — C50919 Malignant neoplasm of unspecified site of unspecified female breast: Secondary | ICD-10-CM

## 2018-04-28 DIAGNOSIS — C7951 Secondary malignant neoplasm of bone: Secondary | ICD-10-CM | POA: Insufficient documentation

## 2018-04-28 DIAGNOSIS — T451X5A Adverse effect of antineoplastic and immunosuppressive drugs, initial encounter: Secondary | ICD-10-CM

## 2018-04-28 MED ORDER — DENOSUMAB 120 MG/1.7ML ~~LOC~~ SOLN: 120.0000 mg | Freq: Once | SUBCUTANEOUS | Status: DC

## 2018-04-28 MED ORDER — DENOSUMAB 120 MG/1.7ML ~~LOC~~ SOLN
SUBCUTANEOUS | Status: AC
  Filled 2018-04-28: qty 1.7

## 2018-04-28 MED ORDER — FULVESTRANT 250 MG/5ML IM SOLN
500.0000 mg | Freq: Once | INTRAMUSCULAR | Status: AC
  Administered 2018-04-28: 500 mg via INTRAMUSCULAR

## 2018-04-28 MED ORDER — FULVESTRANT 250 MG/5ML IM SOLN
INTRAMUSCULAR | Status: AC
  Filled 2018-04-28: qty 10

## 2018-05-04 MED FILL — LOSARTAN POTASSIUM 50 MG TA: 50 | 90 days supply | Qty: 180 | Fill #1

## 2018-05-12 ENCOUNTER — Encounter (HOSPITAL_COMMUNITY): Payer: Self-pay | Admitting: Nurse Practitioner

## 2018-05-12 ENCOUNTER — Other Ambulatory Visit: Payer: Self-pay

## 2018-05-12 ENCOUNTER — Ambulatory Visit (HOSPITAL_COMMUNITY)
Admission: RE | Admit: 2018-05-12 | Discharge: 2018-05-12 | Disposition: A | Discharge status: Home / Self Care | Payer: Medicare Other | Source: Ambulatory Visit | Attending: Nurse Practitioner | Admitting: Nurse Practitioner

## 2018-05-12 VITALS — BP 116/78 | HR 104 | Ht 62.0 in | Wt 176.0 lb

## 2018-05-12 DIAGNOSIS — Z79899 Other long term (current) drug therapy: Secondary | ICD-10-CM | POA: Insufficient documentation

## 2018-05-12 DIAGNOSIS — Z888 Allergy status to other drugs, medicaments and biological substances status: Secondary | ICD-10-CM | POA: Diagnosis not present

## 2018-05-12 DIAGNOSIS — Z87891 Personal history of nicotine dependence: Secondary | ICD-10-CM | POA: Diagnosis not present

## 2018-05-12 DIAGNOSIS — Z9889 Other specified postprocedural states: Secondary | ICD-10-CM | POA: Diagnosis not present

## 2018-05-12 DIAGNOSIS — I4811 Longstanding persistent atrial fibrillation: Secondary | ICD-10-CM

## 2018-05-12 DIAGNOSIS — I129 Hypertensive chronic kidney disease with stage 1 through stage 4 chronic kidney disease, or unspecified chronic kidney disease: Secondary | ICD-10-CM | POA: Diagnosis not present

## 2018-05-12 DIAGNOSIS — Z923 Personal history of irradiation: Secondary | ICD-10-CM | POA: Insufficient documentation

## 2018-05-12 DIAGNOSIS — Z90710 Acquired absence of both cervix and uterus: Secondary | ICD-10-CM | POA: Insufficient documentation

## 2018-05-12 DIAGNOSIS — Z853 Personal history of malignant neoplasm of breast: Secondary | ICD-10-CM | POA: Diagnosis not present

## 2018-05-12 DIAGNOSIS — I4819 Other persistent atrial fibrillation: Secondary | ICD-10-CM | POA: Diagnosis not present

## 2018-05-12 DIAGNOSIS — Z8249 Family history of ischemic heart disease and other diseases of the circulatory system: Secondary | ICD-10-CM | POA: Insufficient documentation

## 2018-05-12 DIAGNOSIS — Z7901 Long term (current) use of anticoagulants: Secondary | ICD-10-CM | POA: Insufficient documentation

## 2018-05-12 DIAGNOSIS — I2510 Atherosclerotic heart disease of native coronary artery without angina pectoris: Secondary | ICD-10-CM | POA: Insufficient documentation

## 2018-05-12 DIAGNOSIS — N183 Chronic kidney disease, stage 3 (moderate): Secondary | ICD-10-CM | POA: Insufficient documentation

## 2018-05-12 LAB — CBC
HCT: 39.2 % (ref 36.0–46.0)
HEMOGLOBIN: 12.6 g/dL (ref 12.0–15.0)
MCH: 33.4 pg (ref 26.0–34.0)
MCHC: 32.1 g/dL (ref 30.0–36.0)
MCV: 104 fL — ABNORMAL HIGH (ref 80.0–100.0)
PLATELETS: 222 10*3/uL (ref 150–400)
RBC: 3.77 MIL/uL — AB (ref 3.87–5.11)
RDW: 14.6 % (ref 11.5–15.5)
WBC: 2.4 10*3/uL — AB (ref 4.0–10.5)
nRBC: 0 % (ref 0.0–0.2)

## 2018-05-12 LAB — BASIC METABOLIC PANEL
Anion gap: 8 (ref 5–15)
BUN: 20 mg/dL (ref 8–23)
CO2: 22 mmol/L (ref 22–32)
CREATININE: 1.22 mg/dL — AB (ref 0.44–1.00)
Calcium: 8.7 mg/dL — ABNORMAL LOW (ref 8.9–10.3)
Chloride: 108 mmol/L (ref 98–111)
GFR, EST AFRICAN AMERICAN: 49 mL/min — AB (ref >60)
GFR, EST NON AFRICAN AMERICAN: 43 mL/min — AB (ref >60)
Glucose, Bld: 98 mg/dL (ref 70–99)
Potassium: 4 mmol/L (ref 3.5–5.1)
SODIUM: 138 mmol/L (ref 135–145)

## 2018-05-12 NOTE — H&P (View-Only) (Signed)
Primary Care Physician: [name], MD Referring Physician: Juanda Bond triage Cardiologist: Dr. Rolla Plate is a 77 y.o. female with a h/o paroxysmal atrial fibrillation that led to decompensated heart failure in late 2017, cardioversion 05/27/2016, with recurrence and a successful cardioversion in ED 07/25/2016,  treated with amiodarone, stopped November 2018. She is on anticoagulation with Eliquis without bleeding complications.  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  She walked into cancer center yesterday not feeling right and was found to be in afib with rvr. She was then asked to go [facility]. Dr. Loletha Grayer is on vacation. Triage nurse called here for advise, she was started on Cardizem 120 mg qd and asked to come today for an appointment. She started feeling better within 1-2 hours of taking Cardizem and today is in SR. Dis not tolerate BB 's in the past.She has been under a lot of stress from dealing with her cancer and new treatments and being the Executor of her mothers' estate who died this past Feb 07, 2023.  F/u in afib clinic, 10/ 22. She saw Dr. Loletha Grayer several weeks ago and was back in afib. She was started on amiodarone 200 mg bid and asked to f/u here. She remains in afib at 104 bpm. Her weight is stable, no evidence for fluid overload but she has not felt as well in afib with shortness of breath with exertion and fatigue.   Today, she denies symptoms of palpitations, chest pain,  orthopnea, PND, lower extremity edema, dizziness, presyncope, syncope, or neurologic sequela. + for fatigue. The patient is tolerating medications without difficulties and is otherwise without complaint today.   Past Medical History:  Diagnosis Date  . Breast cancer ([place]) 07-13-1997   right  . Chronic back pain   . CKD  (chronic kidney disease), stage III ([place])   . Complication of anesthesia   . Coronary artery calcification of native artery    a. seen on PET scan 04/2016.  [name] Hypertension   . Pericardial effusion    a. small by TEE 05/2016.  [name] Persistent atrial fibrillation   . Pleural effusion 11-08-2008  . PONV (postoperative nausea and vomiting)   . Radiation 08/17/2015-08/29/2015   lower thoracic spine 22.5 gray  . Radiation 01/17/16-01/24/16   right femur 20Gy   Past Surgical History:  Procedure Laterality Date  . ABDOMINAL HYSTERECTOMY  02-21-1982  . BREAST BIOPSY  07-13-1997  . CARDIOVERSION N/A 05/27/2016   Procedure: CARDIOVERSION;  Surgeon: [name], MD;  Location: [place];  Service: Cardiovascular;  Laterality: N/A;  . DILATION AND CURETTAGE, DIAGNOSTIC / THERAPEUTIC  12-13-1981  . MASTECTOMY  07-29-1997  . PELVIC LAPAROSCOPY  11-06-1981  . POSTERIOR LUMBAR FUSION 4 LEVEL N/A 09/01/2015   Procedure: Thoracic six-Lumbar one fusion with arthrodesis pedicle screw stabilization and decompression;  Surgeon: Ashok Pall, MD;  Location: [place] [facility]UR[facility]S;  Service: Neurosurgery;  Laterality: N/A;  T6-L1 fusion with arthrodesis pedicle screw stabilization and decompression  . TEE WITHOUT CARDIOVERSION N/A 05/27/2016   Procedure: TRANSESOPHAGEAL ECHOCARDIOGRAM (TEE);  Surgeon: [name], MD;  Location: [name] ENDOSCOPY;  Service: Cardiovascular;  Laterality: N/A;  . THORACENTESIS  10-18-2008    Current Outpatient Medications  Medication Sig Dispense Refill  . acetaminophen (TYLENOL) 500 MG tablet Take 500 mg by mouth every 6 (six) hours as needed for moderate pain.    [name]  amiodarone (PACERONE) 200 MG tablet Take 2 tablets (400 mg total) by mouth daily for 30 days, THEN 1 tablet (200 mg total) daily. 120 tablet 3  . CARTIA XT 120 MG 24 hr capsule TAKE 1 CAPSULE BY MOUTH ONCE DAILY 30 capsule 1  . denosumab (PROLIA) 60 MG/ML SOSY injection Inject 60 mg into the skin every 3 (three) months.    [name] ELIQUIS 5  MG TABS tablet TAKE 1 TABLET BY MOUTH TWICE DAILY 180 tablet 1  . fulvestrant (FASLODEX) 250 MG/5ML injection Inject into the muscle every 30 (thirty) days. One injection each buttock over 1-2 minutes. Warm prior to use.     . furosemide (LASIX) 40 MG tablet TAKE 1 TABLET (40 MG TOTAL) BY MOUTH DAILY. 90 tablet 2  . loratadine (CLARITIN) 10 MG tablet Take 10 mg by mouth daily.     [name] losartan (COZAAR) 50 MG tablet Take 100 mg daily by mouth.  2  . Multiple Vitamins-Minerals (PRESERVISION AREDS 2+MULTI VIT PO) Take 2 tablets by mouth daily.    . palbociclib (IBRANCE) 100 MG capsule TAKE 1 CAPSULE BY MOUTH ONCE DAILY WITH BREAKFAST. TAKE WHOLE WITH FOOD. Take for 3 weeks on; 1 week off--alternating. 21 capsule 3  . Potassium 99 MG TABS Take 4 tablets by mouth daily.     . pravastatin (PRAVACHOL) 40 MG tablet Take 1 tablet (40 mg total) by mouth every evening. 90 tablet 3  . vitamin B-12 (CYANOCOBALAMIN) 1000 MCG tablet Take 1,000 mcg by mouth 2 (two) times daily.    . vitamin C (ASCORBIC ACID) 500 MG tablet Take 500 mg by mouth daily.       No current facility-administered medications for this encounter.     Allergies  Allergen Reactions  . Diphenhydramine Hcl Other (See Comments)    Severe headaches  . Codeine Sulfate Rash    REACTION: rash. Makes her jittery  . Metoprolol Other (See Comments)    Face Kept turning red  . Oseltamivir Phosphate Nausea And Vomiting and Rash  . Oxycodone-Acetaminophen Nausea And Vomiting    REACTION: emesis  . Prednisone Other (See Comments)    REACTION: hiatal hernia, runny  Nose, felt terrible  . Sudafed [Pseudoephedrine Hcl] Other (See Comments)    vertigo    Social History   Socioeconomic History  . Marital status: Single    Spouse name: Not on file  . Number of children: 0  . Years of education: Not on file  . Highest education level: Not on file  Occupational History  . Occupation: Firefighter for [employer]    . Financial resource strain: Not on file  . Food insecurity:    Worry: Not on file    Inability: Not on file  . Transportation needs:    Medical: Not on file    Non-medical: Not on file  Tobacco Use  . Smoking status: Former Smoker    Packs/day: 0.50    Years: 30.00    Pack years: 15.00    Types: Cigarettes    Last attempt to quit: 07/22/1996    Years since quitting: 21.8  . Smokeless tobacco: Never Used  . Tobacco comment: 1/2 up to 1 ppd  Substance and Sexual Activity  . Alcohol use: No    Alcohol/week: 0.0 standard drinks    Comment: hx of alcohol socially - hasn't had drink since March 1998  . Drug use: No  . Sexual activity: Not on file  Lifestyle  .  Physical activity:    Days per week: Not on file    Minutes per session: Not on file  . Stress: Not on file  Relationships  . Social connections:    Talks on phone: Not on file    Gets together: Not on file    Attends religious service: Not on file    Active member of club or organization: Not on file    Attends meetings of clubs or organizations: Not on file    Relationship status: Not on file  . Intimate partner violence:    Fear of current or ex partner: Not on file    Emotionally abused: Not on file    Physically abused: Not on file    Forced sexual activity: Not on file  Other Topics Concern  . Not on file  Social History Narrative  . Not on file    Family History  Problem Relation Age of Onset  . Heart disease Mother        with pacemaker   . Asthma Father   . Heart attack Maternal Grandfather 64  . Diabetes Paternal Grandmother   . Aortic stenosis Maternal Uncle   . Prostate cancer Maternal Uncle        dx unspecified age  . Prostate cancer Maternal Uncle        dx older than 50y  . Prostate cancer Maternal Uncle        dx older than 50y  . Prostate cancer Maternal Uncle 72       s/p prostatectomy  . Breast cancer Cousin 75       maternal 1st cousin; s/p lumpectomy and radiation  . Breast  cancer Cousin        maternal 1st cousin dx mid-late 69s; s/p mastectomy    ROS- All systems are reviewed and negative except as per the HPI above  Physical Exam: Vitals:   05/12/18 1126  BP: 116/78  Pulse: (!) 104  Weight: 79.8 kg  Height: 5\' 2"  (1.575 m)   Wt Readings from Last 3 Encounters:  05/12/18 79.8 kg  04/20/18 82.1 kg  03/03/18 78.1 kg    Labs: Lab Results  Component Value Date   NA 138 05/12/2018   K 4.0 05/12/2018   CL 108 05/12/2018   CO2 22 05/12/2018   GLUCOSE 98 05/12/2018   BUN 20 05/12/2018   CREATININE 1.22 (H) 05/12/2018   CALCIUM 8.7 (L) 05/12/2018   PHOS 3.3 05/31/2016   MG 2.1 05/31/2016   Lab Results  Component Value Date   INR 0.95 01/15/2018   Lab Results  Component Value Date   CHOL 134 05/23/2016   HDL 31 (L) 05/23/2016   LDLCALC 54 05/23/2016   TRIG 247 (H) 05/23/2016     GEN- The patient is well appearing, alert and oriented x 3 today.   Head- normocephalic, atraumatic Eyes-  Sclera clear, conjunctiva pink Ears- hearing intact Oropharynx- clear Neck- supple, no JVP Lymph- no cervical lymphadenopathy Lungs- Clear to ausculation bilaterally, normal work of breathing Heart- IRR egular rate and rhythm, no murmurs, rubs or gallops, PMI not laterally displaced GI- soft, NT, ND, + BS Extremities- no clubbing, cyanosis, or edema MS- no significant deformity or atrophy Skin- no rash or lesion Psych- euthymic mood, full affect Neuro- strength and sensation are intact  EKG-afib at 104 bpm, qrs int 92, qt 518 ms ( can tolerate qtc up to 600 ms on amiodarone) Epic records reviewed    Assessment and Plan:  1. Persistent  afib Reloading on amiodarone x 3 weeks Will schedule for cardioversion 11/1, will lower amio to 1 tab a day following cardioversion Risk vrs benefit discussed  2. CHA2DS2VASc score of at least 4 Continue eliquis 5 mg bid, states no missed doses  Will request f/u with Dr. Loletha Grayer in 4-6 weeks afib clinic as  needed  Butch Penny C. Chance Karam, [facility] [address] [place], [place] [address] [phone]

## 2018-05-12 NOTE — Patient Instructions (Addendum)
Cardioversion scheduled for Friday, November 1st  - Arrive at the [facility] and go to admitting at [facility] not eat or drink anything after midnight the night prior to your procedure.  - Take all your morning medication with a sip of water prior to arrival.  - You will not be able to drive home after your procedure.  Day after cardioversion go down to 1 tablet a day of amiodarone.

## 2018-05-12 NOTE — Progress Notes (Signed)
Primary Care Physician: [name], MD Referring Physician: Juanda Bond triage Cardiologist: Dr. Rolla Plate is a 77 y.o. female with a h/o paroxysmal atrial fibrillation that led to decompensated heart failure in late 2017, cardioversion 05/27/2016, with recurrence and a successful cardioversion in ED 07/25/2016,  treated with amiodarone, stopped November 2018. She is on anticoagulation with Eliquis without bleeding complications.  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  She walked into cancer center yesterday not feeling right and was found to be in afib with rvr. She was then asked to go [facility]. Dr. Loletha Grayer is on vacation. Triage nurse called here for advise, she was started on Cardizem 120 mg qd and asked to come today for an appointment. She started feeling better within 1-2 hours of taking Cardizem and today is in SR. Dis not tolerate BB 's in the past.She has been under a lot of stress from dealing with her cancer and new treatments and being the Executor of her mothers' estate who died this past January 24, 2023.  F/u in afib clinic, 10/ 22. She saw Dr. Loletha Grayer several weeks ago and was back in afib. She was started on amiodarone 200 mg bid and asked to f/u here. She remains in afib at 104 bpm. Her weight is stable, no evidence for fluid overload but she has not felt as well in afib with shortness of breath with exertion and fatigue.   Today, she denies symptoms of palpitations, chest pain,  orthopnea, PND, lower extremity edema, dizziness, presyncope, syncope, or neurologic sequela. + for fatigue. The patient is tolerating medications without difficulties and is otherwise without complaint today.   Past Medical History:  Diagnosis Date  . Breast cancer ([place]) 07-13-1997   right  . Chronic back pain   . CKD  (chronic kidney disease), stage III ([place])   . Complication of anesthesia   . Coronary artery calcification of native artery    a. seen on PET scan 04/2016.  [name] Hypertension   . Pericardial effusion    a. small by TEE 05/2016.  [name] Persistent atrial fibrillation   . Pleural effusion 11-08-2008  . PONV (postoperative nausea and vomiting)   . Radiation 08/17/2015-08/29/2015   lower thoracic spine 22.5 gray  . Radiation 01/17/16-01/24/16   right femur 20Gy   Past Surgical History:  Procedure Laterality Date  . ABDOMINAL HYSTERECTOMY  02-21-1982  . BREAST BIOPSY  07-13-1997  . CARDIOVERSION N/A 05/27/2016   Procedure: CARDIOVERSION;  Surgeon: [name], MD;  Location: [place];  Service: Cardiovascular;  Laterality: N/A;  . DILATION AND CURETTAGE, DIAGNOSTIC / THERAPEUTIC  12-13-1981  . MASTECTOMY  07-29-1997  . PELVIC LAPAROSCOPY  11-06-1981  . POSTERIOR LUMBAR FUSION 4 LEVEL N/A 09/01/2015   Procedure: Thoracic six-Lumbar one fusion with arthrodesis pedicle screw stabilization and decompression;  Surgeon: Ashok Pall, MD;  Location: [place] NEURO ORS;  Service: Neurosurgery;  Laterality: N/A;  T6-L1 fusion with arthrodesis pedicle screw stabilization and decompression  . TEE WITHOUT CARDIOVERSION N/A 05/27/2016   Procedure: TRANSESOPHAGEAL ECHOCARDIOGRAM (TEE);  Surgeon: [name], MD;  Location: [facility] ENDOSCOPY;  Service: Cardiovascular;  Laterality: N/A;  . THORACENTESIS  10-18-2008    Current Outpatient Medications  Medication Sig Dispense Refill  . acetaminophen (TYLENOL) 500 MG tablet Take 500 mg by mouth every 6 (six) hours as needed for moderate pain.    [name]  amiodarone (PACERONE) 200 MG tablet Take 2 tablets (400 mg total) by mouth daily for 30 days, THEN 1 tablet (200 mg total) daily. 120 tablet 3  . CARTIA XT 120 MG 24 hr capsule TAKE 1 CAPSULE BY MOUTH ONCE DAILY 30 capsule 1  . denosumab (PROLIA) 60 MG/ML SOSY injection Inject 60 mg into the skin every 3 (three) months.    [name] ELIQUIS 5  MG TABS tablet TAKE 1 TABLET BY MOUTH TWICE DAILY 180 tablet 1  . fulvestrant (FASLODEX) 250 MG/5ML injection Inject into the muscle every 30 (thirty) days. One injection each buttock over 1-2 minutes. Warm prior to use.     . furosemide (LASIX) 40 MG tablet TAKE 1 TABLET (40 MG TOTAL) BY MOUTH DAILY. 90 tablet 2  . loratadine (CLARITIN) 10 MG tablet Take 10 mg by mouth daily.     [name] losartan (COZAAR) 50 MG tablet Take 100 mg daily by mouth.  2  . Multiple Vitamins-Minerals (PRESERVISION AREDS 2+MULTI VIT PO) Take 2 tablets by mouth daily.    . palbociclib (IBRANCE) 100 MG capsule TAKE 1 CAPSULE BY MOUTH ONCE DAILY WITH BREAKFAST. TAKE WHOLE WITH FOOD. Take for 3 weeks on; 1 week off--alternating. 21 capsule 3  . Potassium 99 MG TABS Take 4 tablets by mouth daily.     . pravastatin (PRAVACHOL) 40 MG tablet Take 1 tablet (40 mg total) by mouth every evening. 90 tablet 3  . vitamin B-12 (CYANOCOBALAMIN) 1000 MCG tablet Take 1,000 mcg by mouth 2 (two) times daily.    . vitamin C (ASCORBIC ACID) 500 MG tablet Take 500 mg by mouth daily.       No current facility-administered medications for this encounter.     Allergies  Allergen Reactions  . Diphenhydramine Hcl Other (See Comments)    Severe headaches  . Codeine Sulfate Rash    REACTION: rash. Makes her jittery  . Metoprolol Other (See Comments)    Face Kept turning red  . Oseltamivir Phosphate Nausea And Vomiting and Rash  . Oxycodone-Acetaminophen Nausea And Vomiting    REACTION: emesis  . Prednisone Other (See Comments)    REACTION: hiatal hernia, runny  Nose, felt terrible  . Sudafed [Pseudoephedrine Hcl] Other (See Comments)    vertigo    Social History   Socioeconomic History  . Marital status: Single    Spouse name: Not on file  . Number of children: 0  . Years of education: Not on file  . Highest education level: Not on file  Occupational History  . 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 Financial resource strain: Not on file  . Food insecurity:    Worry: Not on file    Inability: Not on file  . Transportation needs:    Medical: Not on file    Non-medical: Not on file  Tobacco Use  . Smoking status: Former Smoker    Packs/day: 0.50    Years: 30.00    Pack years: 15.00    Types: Cigarettes    Last attempt to quit: 07/22/1996    Years since quitting: 21.8  . Smokeless tobacco: Never Used  . Tobacco comment: 1/2 up to 1 ppd  Substance and Sexual Activity  . Alcohol use: No    Alcohol/week: 0.0 standard drinks    Comment: hx of alcohol socially - hasn't had drink since March 1998  . Drug use: No  . Sexual activity: Not on file  Lifestyle  .  Physical activity:    Days per week: Not on file    Minutes per session: Not on file  . Stress: Not on file  Relationships  . Social connections:    Talks on phone: Not on file    Gets together: Not on file    Attends religious service: Not on file    Active member of club or organization: Not on file    Attends meetings of clubs or organizations: Not on file    Relationship status: Not on file  . Intimate partner violence:    Fear of current or ex partner: Not on file    Emotionally abused: Not on file    Physically abused: Not on file    Forced sexual activity: Not on file  Other Topics Concern  . Not on file  Social History Narrative  . Not on file    Family History  Problem Relation Age of Onset  . Heart disease Mother        with pacemaker   . Asthma Father   . Heart attack Maternal Grandfather 64  . Diabetes Paternal Grandmother   . Aortic stenosis Maternal Uncle   . Prostate cancer Maternal Uncle        dx unspecified age  . Prostate cancer Maternal Uncle        dx older than 50y  . Prostate cancer Maternal Uncle        dx older than 50y  . Prostate cancer Maternal Uncle 72       s/p prostatectomy  . Breast cancer Cousin 16       maternal 1st cousin; s/p lumpectomy and radiation  . Breast  cancer Cousin        maternal 1st cousin dx mid-late 17s; s/p mastectomy    ROS- All systems are reviewed and negative except as per the HPI above  Physical Exam: Vitals:   05/12/18 1126  BP: 116/78  Pulse: (!) 104  Weight: 79.8 kg  Height: 5\' 2"  (1.575 m)   Wt Readings from Last 3 Encounters:  05/12/18 79.8 kg  04/20/18 82.1 kg  03/03/18 78.1 kg    Labs: Lab Results  Component Value Date   NA 138 05/12/2018   K 4.0 05/12/2018   CL 108 05/12/2018   CO2 22 05/12/2018   GLUCOSE 98 05/12/2018   BUN 20 05/12/2018   CREATININE 1.22 (H) 05/12/2018   CALCIUM 8.7 (L) 05/12/2018   PHOS 3.3 05/31/2016   MG 2.1 05/31/2016   Lab Results  Component Value Date   INR 0.95 01/15/2018   Lab Results  Component Value Date   CHOL 134 05/23/2016   HDL 31 (L) 05/23/2016   LDLCALC 54 05/23/2016   TRIG 247 (H) 05/23/2016     GEN- The patient is well appearing, alert and oriented x 3 today.   Head- normocephalic, atraumatic Eyes-  Sclera clear, conjunctiva pink Ears- hearing intact Oropharynx- clear Neck- supple, no JVP Lymph- no cervical lymphadenopathy Lungs- Clear to ausculation bilaterally, normal work of breathing Heart- IRR egular rate and rhythm, no murmurs, rubs or gallops, PMI not laterally displaced GI- soft, NT, ND, + BS Extremities- no clubbing, cyanosis, or edema MS- no significant deformity or atrophy Skin- no rash or lesion Psych- euthymic mood, full affect Neuro- strength and sensation are intact  EKG-afib at 104 bpm, qrs int 92, qt 518 ms ( can tolerate qtc up to 600 ms on amiodarone) Epic records reviewed    Assessment and Plan:  1. Persistent  afib Reloading on amiodarone x 3 weeks Will schedule for cardioversion 11/1, will lower amio to 1 tab a day following cardioversion Risk vrs benefit discussed  2. CHA2DS2VASc score of at least 4 Continue eliquis 5 mg bid, states no missed doses  Will request f/u with Dr. Loletha Grayer in 4-6 weeks afib clinic as  needed  Butch Penny C. Carroll, [facility] [address] [place], [place] [address] [phone]

## 2018-05-16 MED FILL — ELIQUIS 5 MG TABLET: 5 | 90 days supply | Qty: 180 | Fill #1

## 2018-05-18 ENCOUNTER — Other Ambulatory Visit: Payer: Self-pay | Admitting: Hematology and Oncology

## 2018-05-18 DIAGNOSIS — C782 Secondary malignant neoplasm of pleura: Principal | ICD-10-CM

## 2018-05-18 DIAGNOSIS — C50919 Malignant neoplasm of unspecified site of unspecified female breast: Secondary | ICD-10-CM

## 2018-05-18 DIAGNOSIS — C50911 Malignant neoplasm of unspecified site of right female breast: Secondary | ICD-10-CM

## 2018-05-21 MED FILL — FUROSEMIDE 40 MG TAB: 40 | 90 days supply | Qty: 90 | Fill #1

## 2018-05-21 MED FILL — CARTIA XT 120 MG CAPSULE: 120 | 30 days supply | Qty: 30 | Fill #1

## 2018-05-21 MED FILL — PRAVASTATIN NA 40 MG TAB: 40 | 90 days supply | Qty: 90 | Fill #3

## 2018-05-21 MED FILL — IBRANCE 100 MG CAPSULE: 100 | 28 days supply | Qty: 21 | Fill #0

## 2018-05-22 ENCOUNTER — Other Ambulatory Visit: Payer: Self-pay

## 2018-05-22 ENCOUNTER — Ambulatory Visit (HOSPITAL_COMMUNITY): Payer: Medicare Other | Admitting: Anesthesiology

## 2018-05-22 ENCOUNTER — Encounter (HOSPITAL_COMMUNITY)
Admission: RE | Disposition: A | Discharge status: Home / Self Care | Payer: Self-pay | Source: Ambulatory Visit | Attending: Cardiovascular Disease

## 2018-05-22 ENCOUNTER — Ambulatory Visit (HOSPITAL_COMMUNITY)
Admission: RE | Admit: 2018-05-22 | Discharge: 2018-05-22 | Disposition: A | Discharge status: Home / Self Care | Payer: Medicare Other | Source: Ambulatory Visit | Attending: Cardiovascular Disease | Admitting: Cardiovascular Disease

## 2018-05-22 DIAGNOSIS — M549 Dorsalgia, unspecified: Secondary | ICD-10-CM | POA: Insufficient documentation

## 2018-05-22 DIAGNOSIS — E785 Hyperlipidemia, unspecified: Secondary | ICD-10-CM | POA: Insufficient documentation

## 2018-05-22 DIAGNOSIS — N183 Chronic kidney disease, stage 3 (moderate): Secondary | ICD-10-CM | POA: Diagnosis not present

## 2018-05-22 DIAGNOSIS — I509 Heart failure, unspecified: Secondary | ICD-10-CM | POA: Diagnosis not present

## 2018-05-22 DIAGNOSIS — G8929 Other chronic pain: Secondary | ICD-10-CM | POA: Insufficient documentation

## 2018-05-22 DIAGNOSIS — I4891 Unspecified atrial fibrillation: Secondary | ICD-10-CM | POA: Diagnosis not present

## 2018-05-22 DIAGNOSIS — Z87891 Personal history of nicotine dependence: Secondary | ICD-10-CM | POA: Insufficient documentation

## 2018-05-22 DIAGNOSIS — Z7901 Long term (current) use of anticoagulants: Secondary | ICD-10-CM | POA: Diagnosis not present

## 2018-05-22 DIAGNOSIS — I4819 Other persistent atrial fibrillation: Secondary | ICD-10-CM | POA: Diagnosis not present

## 2018-05-22 DIAGNOSIS — I2510 Atherosclerotic heart disease of native coronary artery without angina pectoris: Secondary | ICD-10-CM | POA: Diagnosis not present

## 2018-05-22 DIAGNOSIS — Z885 Allergy status to narcotic agent status: Secondary | ICD-10-CM | POA: Insufficient documentation

## 2018-05-22 DIAGNOSIS — I130 Hypertensive heart and chronic kidney disease with heart failure and stage 1 through stage 4 chronic kidney disease, or unspecified chronic kidney disease: Secondary | ICD-10-CM | POA: Insufficient documentation

## 2018-05-22 HISTORY — PX: CARDIOVERSION: SHX1299

## 2018-05-22 SURGERY — CARDIOVERSION
Anesthesia: General

## 2018-05-22 MED ORDER — PROPOFOL 10 MG/ML IV BOLUS
INTRAVENOUS | Status: DC | PRN
  Administered 2018-05-22: 80 mg via INTRAVENOUS

## 2018-05-22 MED ORDER — LIDOCAINE 2% (20 MG/ML) 5 ML SYRINGE
INTRAMUSCULAR | Status: DC | PRN
  Administered 2018-05-22: 40 mg via INTRAVENOUS

## 2018-05-22 MED ORDER — SODIUM CHLORIDE 0.9 % IV SOLN
INTRAVENOUS | Status: DC
  Administered 2018-05-22: 11:00:00 via INTRAVENOUS

## 2018-05-22 NOTE — Interval H&P Note (Signed)
History and Physical Interval Note:  05/22/2018 9:52 AM  Lisa [name]  has presented today for surgery, with the diagnosis of ATRIAL FIBRILLATION  The various methods of treatment have been discussed with the patient and family. After consideration of risks, benefits and other options for treatment, the patient has consented to  Procedure(s): CARDIOVERSION (N/A) as a surgical intervention .  The patient's history has been reviewed, patient examined, no change in status, stable for surgery.  I have reviewed the patient's chart and labs.  Questions were answered to the patient's satisfaction.     Jenkins Rouge

## 2018-05-22 NOTE — Anesthesia Postprocedure Evaluation (Signed)
Anesthesia Post Note  Patient: Lisa [name]  Procedure(s) Performed: CARDIOVERSION (N/A )     Patient location during evaluation: PACU Anesthesia Type: General Level of consciousness: awake and alert Pain management: pain level controlled Vital Signs Assessment: post-procedure vital signs reviewed and stable Respiratory status: spontaneous breathing, nonlabored ventilation and respiratory function stable Cardiovascular status: blood pressure returned to baseline and stable Postop Assessment: no apparent nausea or vomiting Anesthetic complications: no    Last Vitals:  Vitals:   05/22/18 1022 05/22/18 1100  BP: (!) 148/114 (!) 123/58  Pulse: (!) 113 71  Resp: 17 (!) 22  Temp: 37.3 C 36.9 C  SpO2: 99% 97%    Last Pain:  Vitals:   05/22/18 1100  TempSrc: Oral  PainSc: 0-No pain                 Brennan Bailey

## 2018-05-22 NOTE — Anesthesia Preprocedure Evaluation (Signed)
Anesthesia Evaluation  Patient identified by MRN, date of birth, ID band Patient awake    Reviewed: Allergy & Precautions, NPO status , Patient's Chart, lab work & pertinent test results  History of Anesthesia Complications (+) PONV and history of anesthetic complications  Airway Mallampati: II  TM Distance: >3 FB Neck ROM: Full    Dental no notable dental hx. (+) Teeth Intact   Pulmonary former smoker,    Pulmonary exam normal breath sounds clear to auscultation       Cardiovascular hypertension, Pt. on medications + CAD and +CHF  Normal cardiovascular exam Rhythm:Irregular Rate:Normal     Neuro/Psych negative neurological ROS  negative psych ROS   GI/Hepatic negative GI ROS, Neg liver ROS,   Endo/Other  negative endocrine ROS  Renal/GU Renal InsufficiencyRenal disease  negative genitourinary   Musculoskeletal negative musculoskeletal ROS (+)   Abdominal   Peds negative pediatric ROS (+)  Hematology negative hematology ROS (+)   Anesthesia Other Findings   Reproductive/Obstetrics negative OB ROS                             Anesthesia Physical Anesthesia Plan  ASA: II  Anesthesia Plan: General   Post-op Pain Management:    Induction:   PONV Risk Score and Plan: 4 or greater and Treatment may vary due to age or medical condition and Propofol infusion  Airway Management Planned: Mask  Additional Equipment:   Intra-op Plan:   Post-operative Plan:   Informed Consent: I have reviewed the patients History and Physical, chart, labs and discussed the procedure including the risks, benefits and alternatives for the proposed anesthesia with the patient or authorized representative who has indicated his/her understanding and acceptance.   Dental advisory given  Plan Discussed with: CRNA and Surgeon  Anesthesia Plan Comments:         Anesthesia Quick Evaluation

## 2018-05-22 NOTE — Transfer of Care (Signed)
Immediate Anesthesia Transfer of Care Note  Patient: Lisa [name]  Procedure(s) Performed: CARDIOVERSION (N/A )  Patient Location: Endoscopy Unit  Anesthesia Type:General  Level of Consciousness: drowsy  Airway & Oxygen Therapy: Patient Spontanous Breathing  Post-op Assessment: Report given to RN and Post -op Vital signs reviewed and stable  Post vital signs: Reviewed and stable  Last Vitals:  Vitals Value Taken Time  BP    Temp    Pulse 127 05/22/2018 10:46 AM  Resp 22 05/22/2018 10:46 AM  SpO2 99 % 05/22/2018 10:46 AM  Vitals shown include unvalidated device data.  Last Pain:  Vitals:   05/22/18 1022  TempSrc: Oral  PainSc:          Complications: No apparent anesthesia complications

## 2018-05-22 NOTE — Discharge Instructions (Signed)
Electrical Cardioversion, Care After °This sheet gives you information about how to care for yourself after your procedure. Your health care provider may also give you more specific instructions. If you have problems or questions, contact your health care provider. °What can I expect after the procedure? °After the procedure, it is common to have: °· Some redness on the skin where the shocks were given. ° °Follow these instructions at home: °· Do not drive for 24 hours if you were given a medicine to help you relax (sedative). °· Take over-the-counter and prescription medicines only as told by your health care provider. °· Ask your health care provider how to check your pulse. Check it often. °· Rest for 48 hours after the procedure or as told by your health care provider. °· Avoid or limit your caffeine use as told by your health care provider. °Contact a health care provider if: °· You feel like your heart is beating too quickly or your pulse is not regular. °· You have a serious muscle cramp that does not go away. °Get help right away if: °· You have discomfort in your chest. °· You are dizzy or you feel faint. °· You have trouble breathing or you are short of breath. °· Your speech is slurred. °· You have trouble moving an arm or leg on one side of your body. °· Your fingers or toes turn cold or blue. °This information is not intended to replace advice given to you by your health care provider. Make sure you discuss any questions you have with your health care provider. °Document Released: 04/28/2013 Document Revised: 02/09/2016 Document Reviewed: 01/12/2016 °Elsevier Interactive Patient Education © 2018 Elsevier Inc. ° °

## 2018-05-22 NOTE — Anesthesia Procedure Notes (Signed)
Procedure Name: General with mask airway Date/Time: 05/22/2018 10:56 AM Performed by: Imagene Riches, CRNA Pre-anesthesia Checklist: Patient identified, Emergency Drugs available, Suction available and Patient being monitored Patient Re-evaluated:Patient Re-evaluated prior to induction Oxygen Delivery Method: Ambu bag Preoxygenation: Pre-oxygenation with 100% oxygen

## 2018-05-22 NOTE — CV Procedure (Signed)
[place]: Anesthesia:  Propofol  DCC x 1 150 J biphasic Converted from afib rate 120 to NSR rate 70  On Rx eliquis with no missed doses No immediate neurologic sequelae  Jenkins Rouge

## 2018-05-25 ENCOUNTER — Encounter (HOSPITAL_COMMUNITY): Payer: Self-pay | Admitting: Cardiovascular Disease

## 2018-06-01 ENCOUNTER — Encounter (HOSPITAL_COMMUNITY): Payer: Self-pay | Admitting: Nurse Practitioner

## 2018-06-01 ENCOUNTER — Other Ambulatory Visit: Payer: Self-pay

## 2018-06-01 ENCOUNTER — Ambulatory Visit (HOSPITAL_COMMUNITY)
Admission: RE | Admit: 2018-06-01 | Discharge: 2018-06-01 | Disposition: A | Discharge status: Home / Self Care | Payer: Medicare Other | Source: Ambulatory Visit | Attending: Nurse Practitioner | Admitting: Nurse Practitioner

## 2018-06-01 VITALS — BP 128/82 | HR 117 | Ht 62.0 in | Wt 174.0 lb

## 2018-06-01 DIAGNOSIS — Z79899 Other long term (current) drug therapy: Secondary | ICD-10-CM | POA: Diagnosis not present

## 2018-06-01 DIAGNOSIS — I4819 Other persistent atrial fibrillation: Secondary | ICD-10-CM | POA: Diagnosis not present

## 2018-06-01 DIAGNOSIS — Z981 Arthrodesis status: Secondary | ICD-10-CM | POA: Insufficient documentation

## 2018-06-01 DIAGNOSIS — C7951 Secondary malignant neoplasm of bone: Principal | ICD-10-CM

## 2018-06-01 DIAGNOSIS — Z7901 Long term (current) use of anticoagulants: Secondary | ICD-10-CM | POA: Diagnosis not present

## 2018-06-01 DIAGNOSIS — Z888 Allergy status to other drugs, medicaments and biological substances status: Secondary | ICD-10-CM | POA: Diagnosis not present

## 2018-06-01 DIAGNOSIS — Z8249 Family history of ischemic heart disease and other diseases of the circulatory system: Secondary | ICD-10-CM | POA: Insufficient documentation

## 2018-06-01 DIAGNOSIS — I129 Hypertensive chronic kidney disease with stage 1 through stage 4 chronic kidney disease, or unspecified chronic kidney disease: Secondary | ICD-10-CM | POA: Insufficient documentation

## 2018-06-01 DIAGNOSIS — N183 Chronic kidney disease, stage 3 (moderate): Secondary | ICD-10-CM | POA: Insufficient documentation

## 2018-06-01 DIAGNOSIS — Z853 Personal history of malignant neoplasm of breast: Secondary | ICD-10-CM | POA: Diagnosis not present

## 2018-06-01 DIAGNOSIS — C50911 Malignant neoplasm of unspecified site of right female breast: Secondary | ICD-10-CM

## 2018-06-01 DIAGNOSIS — I2510 Atherosclerotic heart disease of native coronary artery without angina pectoris: Secondary | ICD-10-CM | POA: Diagnosis not present

## 2018-06-01 DIAGNOSIS — Z87891 Personal history of nicotine dependence: Secondary | ICD-10-CM | POA: Diagnosis not present

## 2018-06-01 DIAGNOSIS — I480 Paroxysmal atrial fibrillation: Secondary | ICD-10-CM | POA: Insufficient documentation

## 2018-06-01 NOTE — Patient Instructions (Signed)
Increase Amiodarone 200mg  twice a day

## 2018-06-01 NOTE — Progress Notes (Signed)
Thank you, Butch Penny. I agree [employer]

## 2018-06-01 NOTE — Progress Notes (Signed)
Primary Care Physician: [name], MD Referring Physician: Juanda Bond triage Cardiologist: Dr. Rolla Plate is a 77 y.o. female with a h/o paroxysmal atrial fibrillation that led to decompensated heart failure in late 2017, cardioversion 05/27/2016, with recurrence and a successful cardioversion in ED 07/25/2016, with low dose amiodarone treated with amiodarone, stopped November 2018. She is on anticoagulation with Eliquis without bleeding complications.  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  She walked into cancer center yesterday not feeling right and was found to be in afib with rvr. She was then asked to go [facility]. Dr. Loletha Grayer is on vacation. Triage nurse called here for advise, she was started on Cardizem 120 mg qd and asked to come today for an appointment. She started feeling better within 1-2 hours of taking Cardizem and today is in SR. Dis not tolerate BB 's in the past.She has been under a lot of stress from dealing with her cancer and new treatments and being the Executor of her mothers' estate who died this past 01/19/23.  S/p cardioversion 11/11. Cardioversion was successful for 3 days but then she went back into afib late yesterday. She felt so much better in SR. HAs mor energy to do activities around the house. Now back to her usual fatigue. SH is now on 200 mg qd but discussed witll reload x 2 weeks and try DCCV again.    Today, she denies symptoms of palpitations, chest pain, shortness of breath, orthopnea, PND, lower extremity edema, dizziness, presyncope, syncope, or neurologic sequela. The patient is tolerating medications without difficulties and is otherwise without complaint today.   Past Medical History:  Diagnosis Date  . Breast cancer ([place]) 07-13-1997   right  . Chronic back pain    . CKD (chronic kidney disease), stage III ([place])   . Complication of anesthesia   . Coronary artery calcification of native artery    a. seen on PET scan 04/2016.  [name] Hypertension   . Pericardial effusion    a. small by TEE 05/2016.  [name] Persistent atrial fibrillation   . Pleural effusion 11-08-2008  . PONV (postoperative nausea and vomiting)   . Radiation 08/17/2015-08/29/2015   lower thoracic spine 22.5 gray  . Radiation 01/17/16-01/24/16   right femur 20Gy   Past Surgical History:  Procedure Laterality Date  . ABDOMINAL HYSTERECTOMY  02-21-1982  . BREAST BIOPSY  07-13-1997  . CARDIOVERSION N/A 05/27/2016   Procedure: CARDIOVERSION;  Surgeon: [name], MD;  Location: [facility];  Service: Cardiovascular;  Laterality: N/A;  . CARDIOVERSION N/A 05/22/2018   Procedure: CARDIOVERSION;  Surgeon: Josue Hector, MD;  Location: [facility];  Service: Cardiovascular;  Laterality: N/A;  . DILATION AND CURETTAGE, DIAGNOSTIC / THERAPEUTIC  12-13-1981  . MASTECTOMY  07-29-1997  . PELVIC LAPAROSCOPY  11-06-1981  . POSTERIOR LUMBAR FUSION 4 LEVEL N/A 09/01/2015   Procedure: Thoracic six-Lumbar one fusion with arthrodesis pedicle screw stabilization and decompression;  Surgeon: Ashok Pall, MD;  Location: [facility];  Service: Neurosurgery;  Laterality: N/A;  T6-L1 fusion with arthrodesis pedicle screw stabilization and decompression  . TEE WITHOUT CARDIOVERSION N/A 05/27/2016   Procedure: TRANSESOPHAGEAL ECHOCARDIOGRAM (TEE);  Surgeon: [name], MD;  Location: [facility] END[facility]COPY;  Service: Cardiovascular;  Laterality: N/A;  . THORACENTESIS  10-18-2008    Current Outpatient Medications  Medication Sig Dispense Refill  .  acetaminophen (TYLENOL) 500 MG tablet Take 500 mg by mouth every 6 (six) hours as needed for moderate pain.    [name] amiodarone (PACERONE) 200 MG tablet Take 200 mg by mouth 2 (two) times daily.    [name] CARTIA XT 120 MG 24 hr capsule TAKE 1 CAPSULE BY MOUTH ONCE DAILY (Patient taking  differently: Take 120 mg by mouth daily. ) 30 capsule 1  . denosumab (PROLIA) 60 MG/ML SOSY injection Inject 60 mg into the skin every 3 (three) months.    [name] ELIQUIS 5 MG TABS tablet TAKE 1 TABLET BY MOUTH TWICE DAILY (Patient taking differently: Take 5 mg by mouth 2 (two) times daily. ) 180 tablet 1  . fulvestrant (FASLODEX) 250 MG/5ML injection Inject 250 mg into the muscle every 30 (thirty) days. One injection each buttock over 1-2 minutes. Warm prior to use.     . furosemide (LASIX) 40 MG tablet TAKE 1 TABLET (40 MG TOTAL) BY MOUTH DAILY. 90 tablet 2  . IBRANCE 100 MG capsule TAKE 1 CAPSULE BY MOUTH ONCE DAILY WITH BREAKFAST. TAKE WHOLE WITH FOOD. TAKE FOR 3 WEEKS ON; 1 WEEK OFF--ALTERNATING. (Patient taking differently: Take 100 mg by mouth See admin instructions. Take 1 capsule (100 mg( daily with breakfast cyclically for 3 weeks on & 1 week off) 21 capsule 2  . loratadine (CLARITIN) 10 MG tablet Take 10 mg by mouth daily.     [name] losartan (COZAAR) 50 MG tablet Take 100 mg daily by mouth.  2  . Multiple Vitamins-Minerals (PRESERVISION AREDS 2+MULTI VIT PO) Take 1 tablet by mouth 2 (two) times daily.     Vladimir Faster Glycol-Propyl Glycol (LUBRICANT EYE DROPS) 0.4-0.3 % SOLN Place 1 drop into both eyes 3 (three) times daily as needed (for dry/irritated eyes.).    [name] Potassium 99 MG TABS Take 198 mg by mouth 2 (two) times daily.     . pravastatin (PRAVACHOL) 40 MG tablet Take 1 tablet (40 mg total) by mouth every evening. 90 tablet 3  . vitamin B-12 (CYANOCOBALAMIN) 1000 MCG tablet Take 1,000 mcg by mouth 2 (two) times daily.    . vitamin C (ASCORBIC ACID) 500 MG tablet Take 500 mg by mouth daily.       No current facility-administered medications for this encounter.     Allergies  Allergen Reactions  . Diphenhydramine Hcl Other (See Comments)    Severe headaches  . Codeine Sulfate Rash    REACTION: rash. Makes her jittery  . Metoprolol Other (See Comments)    Face Kept turning red  .  Oseltamivir Phosphate Nausea And Vomiting and Rash  . Oxycodone-Acetaminophen Nausea And Vomiting    REACTION: emesis  . Prednisone Other (See Comments)    REACTION: hiatal hernia, runny  Nose, felt terrible  . Sudafed [Pseudoephedrine Hcl] Other (See Comments)    vertigo    Social History   Socioeconomic History  . Marital status: Single    Spouse name: Not on file  . Number of children: 0  . Years of education: Not on file  . Highest education level: Not on file  Occupational History  . Occupation: Firefighter for [employer]  . Financial resource strain: Not on file  . Food insecurity:    Worry: Not on file    Inability: Not on file  . Transportation needs:    Medical: Not on file    Non-medical: Not on file  Tobacco Use  . Smoking status: Former Smoker  Packs/day: 0.50    Years: 30.00    Pack years: 15.00    Types: Cigarettes    Last attempt to quit: 07/22/1996    Years since quitting: 21.8  . Smokeless tobacco: Never Used  . Tobacco comment: 1/2 up to 1 ppd  Substance and Sexual Activity  . Alcohol use: No    Alcohol/week: 0.0 standard drinks    Comment: hx of alcohol socially - hasn't had drink since March 1998  . Drug use: No  . Sexual activity: Not on file  Lifestyle  . Physical activity:    Days per week: Not on file    Minutes per session: Not on file  . Stress: Not on file  Relationships  . Social connections:    Talks on phone: Not on file    Gets together: Not on file    Attends religious service: Not on file    Active member of club or organization: Not on file    Attends meetings of clubs or organizations: Not on file    Relationship status: Not on file  . Intimate partner violence:    Fear of current or ex partner: Not on file    Emotionally abused: Not on file    Physically abused: Not on file    Forced sexual activity: Not on file  Other Topics Concern  . Not on file  Social History Narrative  . Not on  file    Family History  Problem Relation Age of Onset  . Heart disease Mother        with pacemaker   . Asthma Father   . Heart attack Maternal Grandfather 64  . Diabetes Paternal Grandmother   . Aortic stenosis Maternal Uncle   . Prostate cancer Maternal Uncle        dx unspecified age  . Prostate cancer Maternal Uncle        dx older than 50y  . Prostate cancer Maternal Uncle        dx older than 50y  . Prostate cancer Maternal Uncle 72       s/p prostatectomy  . Breast cancer Cousin 78       maternal 1st cousin; s/p lumpectomy and radiation  . Breast cancer Cousin        maternal 1st cousin dx mid-late 47s; s/p mastectomy    ROS- All systems are reviewed and negative except as per the HPI above  Physical Exam: Vitals:   06/01/18 1330  BP: 128/82  Pulse: (!) 117  Weight: 78.9 kg  Height: 5\' 2"  (1.575 m)   Wt Readings from Last 3 Encounters:  06/01/18 78.9 kg  05/22/18 79.8 kg  05/12/18 79.8 kg    Labs: Lab Results  Component Value Date   NA 138 05/12/2018   K 4.0 05/12/2018   CL 108 05/12/2018   CO2 22 05/12/2018   GLUCOSE 98 05/12/2018   BUN 20 05/12/2018   CREATININE 1.22 (H) 05/12/2018   CALCIUM 8.7 (L) 05/12/2018   PHOS 3.3 05/31/2016   MG 2.1 05/31/2016   Lab Results  Component Value Date   INR 0.95 01/15/2018   Lab Results  Component Value Date   CHOL 134 05/23/2016   HDL 31 (L) 05/23/2016   LDLCALC 54 05/23/2016   TRIG 247 (H) 05/23/2016     GEN- The patient is well appearing, alert and oriented x 3 today.   Head- normocephalic, atraumatic Eyes-  Sclera clear, conjunctiva pink Ears- hearing intact Oropharynx- clear  Neck- supple, no JVP Lymph- no cervical lymphadenopathy Lungs- Clear to ausculation bilaterally, normal work of breathing Heart- irregular rate and rhythm, no murmurs, rubs or gallops, PMI not laterally displaced GI- soft, NT, ND, + BS Extremities- no clubbing, cyanosis, or edema MS- no significant deformity or  atrophy Skin- no rash or lesion Psych- euthymic mood, full affect Neuro- strength and sensation are intact  EKG-afib at 117 bpm, qrs int 92 ms, qtc 532 ms Epic records reviewed    Assessment and Plan: 1. Paroxysmal  afib Recent successful cardioversion, but ERAF after around 4 days Will reload amiodarone at 200 mg bid and pt is willing to try another cardioversion after loading a while longer  Conintue Cardizem 120 mg daily  2. CHA2DS2VASc score of at least 4 Continue eliquis 5 mg bid  F/u in 2 weeks   Butch Penny C. Luana Tatro, [facility] [address] [place], [place] [address] [phone]

## 2018-06-02 ENCOUNTER — Inpatient Hospital Stay: Payer: Medicare Other

## 2018-06-02 ENCOUNTER — Inpatient Hospital Stay: Payer: Medicare Other | Attending: Hematology and Oncology

## 2018-06-02 ENCOUNTER — Encounter: Payer: Self-pay | Admitting: *Deleted

## 2018-06-02 ENCOUNTER — Inpatient Hospital Stay: Payer: Medicare Other | Admitting: Hematology and Oncology

## 2018-06-02 VITALS — BP 112/88 | HR 94 | Temp 98.8°F | Resp 16 | Ht 62.0 in | Wt 175.3 lb

## 2018-06-02 DIAGNOSIS — Z923 Personal history of irradiation: Secondary | ICD-10-CM

## 2018-06-02 DIAGNOSIS — C7951 Secondary malignant neoplasm of bone: Secondary | ICD-10-CM

## 2018-06-02 DIAGNOSIS — C7801 Secondary malignant neoplasm of right lung: Secondary | ICD-10-CM | POA: Insufficient documentation

## 2018-06-02 DIAGNOSIS — Z5111 Encounter for antineoplastic chemotherapy: Secondary | ICD-10-CM | POA: Diagnosis present

## 2018-06-02 DIAGNOSIS — Z79811 Long term (current) use of aromatase inhibitors: Secondary | ICD-10-CM

## 2018-06-02 DIAGNOSIS — C782 Secondary malignant neoplasm of pleura: Secondary | ICD-10-CM

## 2018-06-02 DIAGNOSIS — C50911 Malignant neoplasm of unspecified site of right female breast: Secondary | ICD-10-CM | POA: Insufficient documentation

## 2018-06-02 DIAGNOSIS — D709 Neutropenia, unspecified: Secondary | ICD-10-CM

## 2018-06-02 DIAGNOSIS — Z170 Estrogen receptor positive status [ER+]: Secondary | ICD-10-CM

## 2018-06-02 DIAGNOSIS — I4891 Unspecified atrial fibrillation: Secondary | ICD-10-CM

## 2018-06-02 DIAGNOSIS — C7802 Secondary malignant neoplasm of left lung: Secondary | ICD-10-CM | POA: Insufficient documentation

## 2018-06-02 DIAGNOSIS — C50919 Malignant neoplasm of unspecified site of unspecified female breast: Secondary | ICD-10-CM

## 2018-06-02 DIAGNOSIS — Z7901 Long term (current) use of anticoagulants: Secondary | ICD-10-CM | POA: Insufficient documentation

## 2018-06-02 DIAGNOSIS — N183 Chronic kidney disease, stage 3 (moderate): Secondary | ICD-10-CM | POA: Diagnosis not present

## 2018-06-02 DIAGNOSIS — T451X5A Adverse effect of antineoplastic and immunosuppressive drugs, initial encounter: Principal | ICD-10-CM

## 2018-06-02 DIAGNOSIS — D701 Agranulocytosis secondary to cancer chemotherapy: Secondary | ICD-10-CM

## 2018-06-02 DIAGNOSIS — Z79899 Other long term (current) drug therapy: Secondary | ICD-10-CM | POA: Diagnosis not present

## 2018-06-02 LAB — CBC WITH DIFFERENTIAL (CANCER CENTER ONLY)
Abs Immature Granulocytes: 0.01 10*3/uL (ref 0.00–0.07)
BASOS PCT: 3 %
Basophils Absolute: 0.1 10*3/uL (ref 0.0–0.1)
EOS PCT: 2 %
Eosinophils Absolute: 0.1 10*3/uL (ref 0.0–0.5)
HEMATOCRIT: 36.3 % (ref 36.0–46.0)
Hemoglobin: 12.4 g/dL (ref 12.0–15.0)
Immature Granulocytes: 0 %
Lymphocytes Relative: 30 %
Lymphs Abs: 0.8 10*3/uL (ref 0.7–4.0)
MCH: 34.7 pg — ABNORMAL HIGH (ref 26.0–34.0)
MCHC: 34.2 g/dL (ref 30.0–36.0)
MCV: 101.7 fL — AB (ref 80.0–100.0)
MONO ABS: 0.3 10*3/uL (ref 0.1–1.0)
MONOS PCT: 14 %
NEUTROS ABS: 1.3 10*3/uL — AB (ref 1.7–7.7)
Neutrophils Relative %: 51 %
Platelet Count: 199 10*3/uL (ref 150–400)
RBC: 3.57 MIL/uL — ABNORMAL LOW (ref 3.87–5.11)
RDW: 14.9 % (ref 11.5–15.5)
WBC: 2.5 10*3/uL — AB (ref 4.0–10.5)
nRBC: 0 % (ref 0.0–0.2)

## 2018-06-02 LAB — CMP (CANCER CENTER ONLY)
ALBUMIN: 3.7 g/dL (ref 3.5–5.0)
ALK PHOS: 94 U/L (ref 38–126)
ALT: 17 U/L (ref 0–44)
AST: 33 U/L (ref 15–41)
Anion gap: 9 (ref 5–15)
BILIRUBIN TOTAL: 1 mg/dL (ref 0.3–1.2)
BUN: 22 mg/dL (ref 8–23)
CALCIUM: 9.5 mg/dL (ref 8.9–10.3)
CO2: 26 mmol/L (ref 22–32)
CREATININE: 1.11 mg/dL — AB (ref 0.44–1.00)
Chloride: 106 mmol/L (ref 98–111)
GFR, EST NON AFRICAN AMERICAN: 48 mL/min — AB (ref >60)
GFR, Est AFR Am: 55 mL/min — ABNORMAL LOW (ref >60)
GLUCOSE: 96 mg/dL (ref 70–99)
POTASSIUM: 3.9 mmol/L (ref 3.5–5.1)
Sodium: 141 mmol/L (ref 135–145)
TOTAL PROTEIN: 7 g/dL (ref 6.5–8.1)

## 2018-06-02 MED ORDER — FULVESTRANT 250 MG/5ML IM SOLN
INTRAMUSCULAR | Status: AC
  Filled 2018-06-02: qty 5

## 2018-06-02 MED ORDER — FULVESTRANT 250 MG/5ML IM SOLN
500.0000 mg | Freq: Once | INTRAMUSCULAR | Status: AC
  Administered 2018-06-02: 500 mg via INTRAMUSCULAR

## 2018-06-02 MED ORDER — DENOSUMAB 120 MG/1.7ML ~~LOC~~ SOLN
SUBCUTANEOUS | Status: AC
  Filled 2018-06-02: qty 1.7

## 2018-06-02 NOTE — Progress Notes (Signed)
Patient Care Team: [name], MD as PCP - General (Internal Medicine) [name], MD as PCP - Cardiology (Cardiology)  DIAGNOSIS:    ICD-10-CM   1. 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 [facility]) C50.911    C78.2     SUMMARY OF ONCOLOGIC HISTORY:   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 [name])   1998 Initial Diagnosis    Right breast carcinoma T2N1 ER/PR positive at right mastectomy with node evaluation Dec 1998, treated with adjuvant adriamycin cytoxan followed by 5 years of tamoxifen    2010 Relapse/Recurrence    Metastatic breast cancer to the pleura    08/29/2008 - 08/30/2015 Anti-estrogen oral therapy    Aromasin followed by letrozole    07/2015 Relapse/Recurrence    Progression to bone required aggressive laminectomy February 2017    08/30/2015 - 09/13/2015 Radiation Therapy    Palliative radiation to the spine,  Right ilium ad right hip    01/05/2016 - 04/29/2016 Chemotherapy    Xeloda stopped when she had progression of dsease     05/02/2016 PET scan     innumerable hypermetabolic lesions throughout the axial and appendicular skeleton, increase in size and number of new lesions in skull, right scapular glenoid, throughout sacrum. Findings concerning for liver metastases, numerous pulmonary nodules bilaterally    05/09/2016 - 09/18/2016 Chemotherapy    Taxol weekly initially and later changed her day 1 day 8 every 3 weeks (complicated by A. fib with RVR and prolonged respiratory infection) along with Xgeva     09/19/2016 Procedure    Biopsy of lung mass: Metastatic carcinoma consistent with breast origin, HER-2 negative ratio 1.37    10/01/2016 -  Anti-estrogen oral therapy    LetrozolewithPalbociclib (Ibrance)started 10/01/2016 switched to Faslodex with Ibrance 02/03/2018    06/23/2017 Imaging    CT chest abdomen pelvis: No evidence of progression of metastatic disease, bone metastases stable right-sided pleural metastases and pleural effusions are stable     CHIEF COMPLIANT: Follow up after recent Cardioversion surgery   INTERVAL HISTORY: Lisa [name] is a 77 y.o. with above-mentioned history of metastatic breast cancer currently on palbociclib with Faslodex.  Since her last visit she had cardioversion surgery on 05/22/18 with Dr. Johnsie Cancel due to issues with her atrial fibrillation.   She presents to the clinic alone. Patient reports tiredness s/p cardioversion surgery. Reports bp WNL. Patient presented her recent EKG today. Left hip pain post surgery radiating down her left leg 4wks ago, which is now resolved. Patient believes pain to be a side effect of medication. Labs are WNL. Patient will get Mammogram and MRI 07/10/2018. Next injection of Faslodex will occur 06/30/2018. Next afib appointment will occur 06/16/18 and she will continue her amiodarone.    REVIEW OF SYSTEMS:   Constitutional: Denies fevers, chills or abnormal weight loss (+) tiredness Eyes: Denies blurriness of vision Ears, nose, mouth, throat, and face: Denies mucositis or sore throat Respiratory: Denies cough, dyspnea or wheezes Cardiovascular: Denies palpitation, chest discomfort Gastrointestinal:  Denies nausea, heartburn or change in bowel habits MSK: (+) left hip pain radiating down left leg, resolved Skin: Denies abnormal skin rashes Lymphatics: Denies new lymphadenopathy or easy bruising Neurological:Denies numbness, tingling or new weaknesses Behavioral/Psych: Mood is stable, no new changes  Extremities: No lower extremity edema Breast: denies any pain or lumps or nodules in either breasts All other systems were reviewed with the patient and are negative.  I have reviewed the past medical history, past surgical history, social history and family  history with the patient and they are unchanged from previous note.  ALLERGIES:  is allergic to diphenhydramine hcl; codeine sulfate; metoprolol; oseltamivir phosphate; oxycodone-acetaminophen; prednisone; and sudafed  [pseudoephedrine hcl].  MEDICATIONS:  Current Outpatient Medications  Medication Sig Dispense Refill  . acetaminophen (TYLENOL) 500 MG tablet Take 500 mg by mouth every 6 (six) hours as needed for moderate pain.    [name] amiodarone (PACERONE) 200 MG tablet Take 200 mg by mouth 2 (two) times daily.    [name] CARTIA XT 120 MG 24 hr capsule TAKE 1 CAPSULE BY MOUTH ONCE DAILY (Patient taking differently: Take 120 mg by mouth daily. ) 30 capsule 1  . denosumab (PROLIA) 60 MG/ML SOSY injection Inject 60 mg into the skin every 3 (three) months.    [name] ELIQUIS 5 MG TABS tablet TAKE 1 TABLET BY MOUTH TWICE DAILY (Patient taking differently: Take 5 mg by mouth 2 (two) times daily. ) 180 tablet 1  . fulvestrant (FASLODEX) 250 MG/5ML injection Inject 250 mg into the muscle every 30 (thirty) days. One injection each buttock over 1-2 minutes. Warm prior to use.     . furosemide (LASIX) 40 MG tablet TAKE 1 TABLET (40 MG TOTAL) BY MOUTH DAILY. 90 tablet 2  . IBRANCE 100 MG capsule TAKE 1 CAPSULE BY MOUTH ONCE DAILY WITH BREAKFAST. TAKE WHOLE WITH FOOD. TAKE FOR 3 WEEKS ON; 1 WEEK OFF--ALTERNATING. (Patient taking differently: Take 100 mg by mouth See admin instructions. Take 1 capsule (100 mg( daily with breakfast cyclically for 3 weeks on & 1 week off) 21 capsule 2  . loratadine (CLARITIN) 10 MG tablet Take 10 mg by mouth daily.     [name] losartan (COZAAR) 50 MG tablet Take 100 mg daily by mouth.  2  . Multiple Vitamins-Minerals (PRESERVISION AREDS 2+MULTI VIT PO) Take 1 tablet by mouth 2 (two) times daily.     Vladimir Faster Glycol-Propyl Glycol (LUBRICANT EYE DROPS) 0.4-0.3 % SOLN Place 1 drop into both eyes 3 (three) times daily as needed (for dry/irritated eyes.).    [name] Potassium 99 MG TABS Take 198 mg by mouth 2 (two) times daily.     . pravastatin (PRAVACHOL) 40 MG tablet Take 1 tablet (40 mg total) by mouth every evening. 90 tablet 3  . vitamin B-12 (CYANOCOBALAMIN) 1000 MCG tablet Take 1,000 mcg by mouth 2 (two) times  daily.    . vitamin C (ASCORBIC ACID) 500 MG tablet Take 500 mg by mouth daily.       No current facility-administered medications for this visit.     PHYSICAL EXAMINATION: ECOG PERFORMANCE STATUS: 1 - Symptomatic but completely ambulatory  Vitals:   06/02/18 1355  BP: 112/88  Pulse: 94  Resp: 16  Temp: 98.8 F (37.1 C)  SpO2: 98%   Filed Weights   06/02/18 1355  Weight: 175 lb 4.8 oz (79.5 kg)    GENERAL:alert, no distress and comfortable SKIN: skin color, texture, turgor are normal, no rashes or significant lesions EYES: normal, Conjunctiva are pink and non-injected, sclera clear OROPHARYNX:no exudate, no erythema and lips, buccal mucosa, and tongue normal  NECK: supple, thyroid normal size, non-tender, without nodularity LYMPH:  no palpable lymphadenopathy in the cervical, axillary or inguinal LUNGS: clear to auscultation and percussion with normal breathing effort HEART: regular rate & rhythm and no murmurs and no lower extremity edema ABDOMEN:abdomen soft, non-tender and normal bowel sounds MUSCULOSKELETAL:no cyanosis of digits and no clubbing  NEURO: alert & oriented x 3 with fluent speech, no focal  motor/sensory deficits EXTREMITIES: No lower extremity edema  LABORATORY DATA:  I have reviewed the data as listed CMP Latest Ref Rng & Units 06/02/2018 05/12/2018 04/20/2018  Glucose 70 - 99 mg/dL 96 98 103(H)  BUN 8 - 23 mg/dL _0 Creatinine 0.44 - 1.00 mg/dL 1.11(H) 1.22(H) 1.09(H)  Sodium 135 - 145 mmol/L 141 138 141  Potassium 3.5 - 5.1 mmol/L 3.9 4.0 4.1  Chloride 98 - 111 mmol/L 106 108 104  CO2 22 - 32 mmol/L _1 Calcium 8.9 - 10.3 mg/dL 9.5 8.7(L) 9.0  Total Protein 6.5 - 8.1 g/dL 7.0 - -  Total Bilirubin 0.3 - 1.2 mg/dL 1.0 - -  Alkaline Phos 38 - 126 U/L 94 - -  AST 15 - 41 U/L 33 - -  ALT 0 - 44 U/L 17 - -    Lab Results  Component Value Date   WBC 2.5 (L) 06/02/2018   HGB 12.4 06/02/2018   HCT 36.3 06/02/2018   MCV 101.7 (H)  06/02/2018   PLT 199 06/02/2018   NEUTROABS 1.3 (L) 06/02/2018    ASSESSMENT & PLAN:  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 [facility]) Metastatic breast cancer tolungs and bone: Clinically improving onweekly taxol beginning 05-09-16, several delays including for counts, rapid A fib, respiratory infection etc  Treatment summary: Taxol days 1 and 8 every 3 weeks with Neupogen along with Xgeva; completed 4cycles from 05/09/2016-08/29/2016  Atrial fibrillation: Recurrent with RVR, cardioverted 07/25/2016, amiodarone, metoprolol and Lasix for CHF (Dr. Sallyanne Kuster) CKD stage III Pathologic compression fracture T10 status post radiation and laminectomy T6-L1 09/01/2015, radiation to right iliac area 05/2016  Rebiopsy of the lung nodule 09/19/2016: Metastatic breast cancer that is HER-2 negative, -------------------------------------------------------------------------------------------------------------------------------------------- Current treatment: LetrozolewithPalbociclib started 10/01/2016 switched to Faslodex with Ibrance 02/03/2018  Palbociclib toxicities: 1. Neutropenia: Currently Palbociclib dose is 100 mg daily. Bone metastases: Xgeva every 3 months.  Faslodex toxicities: Joint pains Patient attributes her joint and muscle aches to both  Faslodex.  But she is willing to continue with it because it is helping her.  Atrial fibrillation: Status post cardioversion November 2019.  It only lasted for 3 days and the rhythm went back into atrial fibrillation. She is being managed with medications.  Return to clinic in 3 months for follow-up and scans. She will come monthly for Faslodex.  No orders of the defined types were placed in this encounter.  The patient has a good understanding of the overall plan. she agrees with it. she will call with any problems that may develop before the next visit here.  Nicholas Lose, MD 06/02/2018   I, Cloyde Reams Dorshimer, am acting as scribe for  Nicholas Lose, MD.  I have reviewed the above documentation for accuracy and completeness, and I agree with the above.

## 2018-06-02 NOTE — Progress Notes (Signed)
Primary Care Physician: [name], MD Referring Physician: Juanda Bond triage Cardiologist: Dr. Rolla Plate is a 77 y.o. female with a h/o paroxysmal atrial fibrillation that led to decompensated heart failure in late 2017, cardioversion 05/27/2016, with recurrence and a successful cardioversion in ED 07/25/2016, with low dose amiodarone treated with amiodarone, stopped November 2018. She is on anticoagulation with Eliquis without bleeding complications.  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  She walked into cancer center yesterday not feeling right and was found to be in afib with rvr. She was then asked to go [facility]. Dr. Loletha Grayer is on vacation. Triage nurse called here for advise, she was started on Cardizem 120 mg qd and asked to come today for an appointment. She started feeling better within 1-2 hours of taking Cardizem and today is in SR. Dis not tolerate BB 's in the past.She has been under a lot of stress from dealing with her cancer and new treatments and being the Executor of her mothers' estate who died this past 31-Jan-2023.  S/p cardioversion 11/11. Cardioversion was successful for 3 days but then she went back into afib late yesterday. She felt so much better in SR. Had more energy to do activities around the house. Now back to her usual fatigue. She is now on 200 mg qd but discussed that witll reload x 2 weeks and try DCCV again.    Today, she denies symptoms of palpitations, chest pain, shortness of breath, orthopnea, PND, lower extremity edema, dizziness, presyncope, syncope, or neurologic sequela. The patient is tolerating medications without difficulties and is otherwise without complaint today.   Past Medical History:  Diagnosis Date  . Breast cancer ([place]) 07-13-1997   right  . Chronic  back pain   . CKD (chronic kidney disease), stage III ([place])   . Complication of anesthesia   . Coronary artery calcification of native artery    a. seen on PET scan 04/2016.  [name] Hypertension   . Pericardial effusion    a. small by TEE 05/2016.  [name] Persistent atrial fibrillation   . Pleural effusion 11-08-2008  . PONV (postoperative nausea and vomiting)   . Radiation 08/17/2015-08/29/2015   lower thoracic spine 22.5 gray  . Radiation 01/17/16-01/24/16   right femur 20Gy   Past Surgical History:  Procedure Laterality Date  . ABDOMINAL HYSTERECTOMY  02-21-1982  . BREAST BIOPSY  07-13-1997  . CARDIOVERSION N/A 05/27/2016   Procedure: CARDIOVERSION;  Surgeon: [name], MD;  Location: [facility];  Service: Cardiovascular;  Laterality: N/A;  . CARDIOVERSION N/A 05/22/2018   Procedure: CARDIOVERSION;  Surgeon: Josue Hector, MD;  Location: [facility];  Service: Cardiovascular;  Laterality: N/A;  . DILATION AND CURETTAGE, DIAGNOSTIC / THERAPEUTIC  12-13-1981  . MASTECTOMY  07-29-1997  . PELVIC LAPAROSCOPY  11-06-1981  . POSTERIOR LUMBAR FUSION 4 LEVEL N/A 09/01/2015   Procedure: Thoracic six-Lumbar one fusion with arthrodesis pedicle screw stabilization and decompression;  Surgeon: Ashok Pall, MD;  Location: [facility];  Service: Neurosurgery;  Laterality: N/A;  T6-L1 fusion with arthrodesis pedicle screw stabilization and decompression  . TEE WITHOUT CARDIOVERSION N/A 05/27/2016   Procedure: TRANSESOPHAGEAL ECHOCARDIOGRAM (TEE);  Surgeon: [name], MD;  Location[name];  Service: Cardiovascular;  Laterality: N/A;  . THORACENTESIS  10-18-2008    Current Outpatient Medications  Medication Sig Dispense Refill  .  acetaminophen (TYLENOL) 500 MG tablet Take 500 mg by mouth every 6 (six) hours as needed for moderate pain.    [name] amiodarone (PACERONE) 200 MG tablet Take 200 mg by mouth 2 (two) times daily.    [name] CARTIA XT 120 MG 24 hr capsule TAKE 1 CAPSULE BY MOUTH ONCE DAILY (Patient  taking differently: Take 120 mg by mouth daily. ) 30 capsule 1  . denosumab (PROLIA) 60 MG/ML SOSY injection Inject 60 mg into the skin every 3 (three) months.    [name] ELIQUIS 5 MG TABS tablet TAKE 1 TABLET BY MOUTH TWICE DAILY (Patient taking differently: Take 5 mg by mouth 2 (two) times daily. ) 180 tablet 1  . fulvestrant (FASLODEX) 250 MG/5ML injection Inject 250 mg into the muscle every 30 (thirty) days. One injection each buttock over 1-2 minutes. Warm prior to use.     . furosemide (LASIX) 40 MG tablet TAKE 1 TABLET (40 MG TOTAL) BY MOUTH DAILY. 90 tablet 2  . IBRANCE 100 MG capsule TAKE 1 CAPSULE BY MOUTH ONCE DAILY WITH BREAKFAST. TAKE WHOLE WITH FOOD. TAKE FOR 3 WEEKS ON; 1 WEEK OFF--ALTERNATING. (Patient taking differently: Take 100 mg by mouth See admin instructions. Take 1 capsule (100 mg( daily with breakfast cyclically for 3 weeks on & 1 week off) 21 capsule 2  . loratadine (CLARITIN) 10 MG tablet Take 10 mg by mouth daily.     [name] losartan (COZAAR) 50 MG tablet Take 100 mg daily by mouth.  2  . Multiple Vitamins-Minerals (PRESERVISION AREDS 2+MULTI VIT PO) Take 1 tablet by mouth 2 (two) times daily.     Vladimir Faster Glycol-Propyl Glycol (LUBRICANT EYE DROPS) 0.4-0.3 % SOLN Place 1 drop into both eyes 3 (three) times daily as needed (for dry/irritated eyes.).    [name] Potassium 99 MG TABS Take 198 mg by mouth 2 (two) times daily.     . pravastatin (PRAVACHOL) 40 MG tablet Take 1 tablet (40 mg total) by mouth every evening. 90 tablet 3  . vitamin B-12 (CYANOCOBALAMIN) 1000 MCG tablet Take 1,000 mcg by mouth 2 (two) times daily.    . vitamin C (ASCORBIC ACID) 500 MG tablet Take 500 mg by mouth daily.       No current facility-administered medications for this encounter.     Allergies  Allergen Reactions  . Diphenhydramine Hcl Other (See Comments)    Severe headaches  . Codeine Sulfate Rash    REACTION: rash. Makes her jittery  . Metoprolol Other (See Comments)    Face Kept turning red  .  Oseltamivir Phosphate Nausea And Vomiting and Rash  . Oxycodone-Acetaminophen Nausea And Vomiting    REACTION: emesis  . Prednisone Other (See Comments)    REACTION: hiatal hernia, runny  Nose, felt terrible  . Sudafed [Pseudoephedrine Hcl] Other (See Comments)    vertigo    Social History   Socioeconomic History  . Marital status: Single    Spouse name: Not on file  . Number of children: 0  . Years of education: Not on file  . Highest education level: Not on file  Occupational History  . Occupation: Firefighter for [employer]  . Financial resource strain: Not on file  . Food insecurity:    Worry: Not on file    Inability: Not on file  . Transportation needs:    Medical: Not on file    Non-medical: Not on file  Tobacco Use  . Smoking status: Former Smoker  Packs/day: 0.50    Years: 30.00    Pack years: 15.00    Types: Cigarettes    Last attempt to quit: 07/22/1996    Years since quitting: 21.8  . Smokeless tobacco: Never Used  . Tobacco comment: 1/2 up to 1 ppd  Substance and Sexual Activity  . Alcohol use: No    Alcohol/week: 0.0 standard drinks    Comment: hx of alcohol socially - hasn't had drink since March 1998  . Drug use: No  . Sexual activity: Not on file  Lifestyle  . Physical activity:    Days per week: Not on file    Minutes per session: Not on file  . Stress: Not on file  Relationships  . Social connections:    Talks on phone: Not on file    Gets together: Not on file    Attends religious service: Not on file    Active member of club or organization: Not on file    Attends meetings of clubs or organizations: Not on file    Relationship status: Not on file  . Intimate partner violence:    Fear of current or ex partner: Not on file    Emotionally abused: Not on file    Physically abused: Not on file    Forced sexual activity: Not on file  Other Topics Concern  . Not on file  Social History Narrative  . Not on  file    Family History  Problem Relation Age of Onset  . Heart disease Mother        with pacemaker   . Asthma Father   . Heart attack Maternal Grandfather 64  . Diabetes Paternal Grandmother   . Aortic stenosis Maternal Uncle   . Prostate cancer Maternal Uncle        dx unspecified age  . Prostate cancer Maternal Uncle        dx older than 50y  . Prostate cancer Maternal Uncle        dx older than 50y  . Prostate cancer Maternal Uncle 72       s/p prostatectomy  . Breast cancer Cousin 91       maternal 1st cousin; s/p lumpectomy and radiation  . Breast cancer Cousin        maternal 1st cousin dx mid-late 45s; s/p mastectomy    ROS- All systems are reviewed and negative except as per the HPI above  Physical Exam: Vitals:   06/01/18 1330  BP: 128/82  Pulse: (!) 117  Weight: 78.9 kg  Height: 5\' 2"  (1.575 m)   Wt Readings from Last 3 Encounters:  06/01/18 78.9 kg  05/22/18 79.8 kg  05/12/18 79.8 kg    Labs: Lab Results  Component Value Date   NA 138 05/12/2018   K 4.0 05/12/2018   CL 108 05/12/2018   CO2 22 05/12/2018   GLUCOSE 98 05/12/2018   BUN 20 05/12/2018   CREATININE 1.22 (H) 05/12/2018   CALCIUM 8.7 (L) 05/12/2018   PHOS 3.3 05/31/2016   MG 2.1 05/31/2016   Lab Results  Component Value Date   INR 0.95 01/15/2018   Lab Results  Component Value Date   CHOL 134 05/23/2016   HDL 31 (L) 05/23/2016   LDLCALC 54 05/23/2016   TRIG 247 (H) 05/23/2016     GEN- The patient is well appearing, alert and oriented x 3 today.   Head- normocephalic, atraumatic Eyes-  Sclera clear, conjunctiva pink Ears- hearing intact Oropharynx- clear  Neck- supple, no JVP Lymph- no cervical lymphadenopathy Lungs- Clear to ausculation bilaterally, normal work of breathing Heart- irregular rate and rhythm, no murmurs, rubs or gallops, PMI not laterally displaced GI- soft, NT, ND, + BS Extremities- no clubbing, cyanosis, or edema MS- no significant deformity or  atrophy Skin- no rash or lesion Psych- euthymic mood, full affect Neuro- strength and sensation are intact  EKG-afib at 117 bpm, qrs int 92 ms, qtc 532 ms Epic records reviewed    Assessment and Plan: 1. Paroxysmal  afib Recent successful cardioversion, but ERAF after around 4 days Will reload amiodarone at 200 mg bid and pt is willing to try another cardioversion after loading a while longer  Conintue Cardizem 120 mg daily  2. CHA2DS2VASc score of at least 4 Continue eliquis 5 mg bid  F/u in 2 weeks   Butch Penny C. Trenese Haft, [facility] [address] [place], [place] [address] [phone]

## 2018-06-02 NOTE — Progress Notes (Signed)
03/31/2018 pt received Xgeva injection and Faslodex injections this day by this nurse. Pharmacy is aware. Porsche Cates LPN

## 2018-06-02 NOTE — Addendum Note (Signed)
Encounter addended by: Sherran Needs, NP on: 06/02/2018 9:51 AM  Actions taken: Sign clinical note

## 2018-06-02 NOTE — Assessment & Plan Note (Signed)
Metastatic breast cancer tolungs and bone: Clinically improving onweekly taxol beginning 05-09-16, several delays including for counts, rapid A fib, respiratory infection etc  Treatment summary: Taxol days 1 and 8 every 3 weeks with Neupogen along with Xgeva; completed 4cycles from 05/09/2016-08/29/2016  Atrial fibrillation: Recurrent with RVR, cardioverted 07/25/2016, amiodarone, metoprolol and Lasix for CHF (Dr. Sallyanne Kuster) CKD stage III Pathologic compression fracture T10 status post radiation and laminectomy T6-L1 09/01/2015, radiation to right iliac area 05/2016  Rebiopsy of the lung nodule 09/19/2016: Metastatic breast cancer that is HER-2 negative, -------------------------------------------------------------------------------------------------------------------------------------------- Current treatment: LetrozolewithPalbociclib started 10/01/2016 switched to Faslodex with Ibrance 02/03/2018  Palbociclib toxicities: 1. Neutropenia: Currently Palbociclib dose is 100 mg daily. Bone metastases: Xgeva every 3 months.  Faslodex toxicities: Joint pains Patient attributes her joint and muscle aches to both  Faslodex.  But she is willing to continue with it because it is helping her.  Return to clinic in 3 months for follow-up and scans. She will come monthly for Faslodex.

## 2018-06-02 NOTE — Progress Notes (Signed)
Per May Armel RN, Ms. Searles received Faslodex and Delton See on 03/31/2018. Will proceed with Faslodex today.

## 2018-06-16 ENCOUNTER — Telehealth: Payer: Self-pay | Admitting: Cardiovascular Disease

## 2018-06-16 ENCOUNTER — Encounter (HOSPITAL_COMMUNITY): Payer: Self-pay | Admitting: Nurse Practitioner

## 2018-06-16 ENCOUNTER — Other Ambulatory Visit: Payer: Self-pay | Admitting: Cardiovascular Disease

## 2018-06-16 ENCOUNTER — Ambulatory Visit (HOSPITAL_COMMUNITY)
Admission: RE | Admit: 2018-06-16 | Discharge: 2018-06-16 | Disposition: A | Discharge status: Home / Self Care | Payer: Medicare Other | Source: Ambulatory Visit | Attending: Nurse Practitioner | Admitting: Nurse Practitioner

## 2018-06-16 VITALS — BP 126/78 | HR 127 | Ht 62.0 in | Wt 175.0 lb

## 2018-06-16 DIAGNOSIS — N183 Chronic kidney disease, stage 3 (moderate): Secondary | ICD-10-CM | POA: Diagnosis not present

## 2018-06-16 DIAGNOSIS — C782 Secondary malignant neoplasm of pleura: Secondary | ICD-10-CM | POA: Insufficient documentation

## 2018-06-16 DIAGNOSIS — G8929 Other chronic pain: Secondary | ICD-10-CM | POA: Insufficient documentation

## 2018-06-16 DIAGNOSIS — I4891 Unspecified atrial fibrillation: Secondary | ICD-10-CM | POA: Diagnosis present

## 2018-06-16 DIAGNOSIS — E785 Hyperlipidemia, unspecified: Secondary | ICD-10-CM | POA: Diagnosis not present

## 2018-06-16 DIAGNOSIS — Z8249 Family history of ischemic heart disease and other diseases of the circulatory system: Secondary | ICD-10-CM | POA: Diagnosis not present

## 2018-06-16 DIAGNOSIS — I4819 Other persistent atrial fibrillation: Secondary | ICD-10-CM | POA: Diagnosis not present

## 2018-06-16 DIAGNOSIS — I2510 Atherosclerotic heart disease of native coronary artery without angina pectoris: Secondary | ICD-10-CM | POA: Insufficient documentation

## 2018-06-16 DIAGNOSIS — C7951 Secondary malignant neoplasm of bone: Secondary | ICD-10-CM | POA: Diagnosis not present

## 2018-06-16 DIAGNOSIS — Z79899 Other long term (current) drug therapy: Secondary | ICD-10-CM | POA: Insufficient documentation

## 2018-06-16 DIAGNOSIS — Z853 Personal history of malignant neoplasm of breast: Secondary | ICD-10-CM | POA: Insufficient documentation

## 2018-06-16 DIAGNOSIS — Z87891 Personal history of nicotine dependence: Secondary | ICD-10-CM | POA: Insufficient documentation

## 2018-06-16 DIAGNOSIS — I129 Hypertensive chronic kidney disease with stage 1 through stage 4 chronic kidney disease, or unspecified chronic kidney disease: Secondary | ICD-10-CM | POA: Insufficient documentation

## 2018-06-16 DIAGNOSIS — Z7901 Long term (current) use of anticoagulants: Secondary | ICD-10-CM | POA: Diagnosis not present

## 2018-06-16 LAB — CBC
HEMATOCRIT: 37.3 % (ref 36.0–46.0)
Hemoglobin: 12.4 g/dL (ref 12.0–15.0)
MCH: 34.2 pg — ABNORMAL HIGH (ref 26.0–34.0)
MCHC: 33.2 g/dL (ref 30.0–36.0)
MCV: 102.8 fL — AB (ref 80.0–100.0)
NRBC: 0 % (ref 0.0–0.2)
Platelets: 165 10*3/uL (ref 150–400)
RBC: 3.63 MIL/uL — ABNORMAL LOW (ref 3.87–5.11)
RDW: 15 % (ref 11.5–15.5)
WBC: 2 10*3/uL — AB (ref 4.0–10.5)

## 2018-06-16 LAB — BASIC METABOLIC PANEL
ANION GAP: 8 (ref 5–15)
BUN: 19 mg/dL (ref 8–23)
CALCIUM: 8.6 mg/dL — AB (ref 8.9–10.3)
CO2: 22 mmol/L (ref 22–32)
Chloride: 108 mmol/L (ref 98–111)
Creatinine, Ser: 1.22 mg/dL — ABNORMAL HIGH (ref 0.44–1.00)
GFR, EST AFRICAN AMERICAN: 51 mL/min — AB (ref >60)
GFR, EST NON AFRICAN AMERICAN: 44 mL/min — AB (ref >60)
GLUCOSE: 102 mg/dL — AB (ref 70–99)
Potassium: 3.4 mmol/L — ABNORMAL LOW (ref 3.5–5.1)
Sodium: 138 mmol/L (ref 135–145)

## 2018-06-16 MED FILL — IBRANCE 100 MG CAPSULE: 100 | 28 days supply | Qty: 21 | Fill #1

## 2018-06-16 NOTE — H&P (View-Only) (Signed)
Primary Care Physician: [name], MD Referring Physician: Juanda Bond triage Cardiologist: Dr. Rolla Plate is a 77 y.o. female with a h/o paroxysmal atrial fibrillation that led to decompensated heart failure in late 2017, cardioversion 05/27/2016, with recurrence and a successful cardioversion in ED 07/25/2016, with low dose amiodarone treated with amiodarone, stopped November 2018. She is on anticoagulation with Eliquis without bleeding complications.  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  She walked into cancer center yesterday not feeling right and was found to be in afib with rvr. She was then asked to go [facility]. Dr. Loletha Grayer is on vacation. Triage nurse called here for advise, she was started on Cardizem 120 mg qd and asked to come today for an appointment. She started feeling better within 1-2 hours of taking Cardizem and today is in SR. Dis not tolerate BB 's in the past.She has been under a lot of stress from dealing with her cancer and new treatments and being the Executor of her mothers' estate who died this past 2023-02-11.  S/p cardioversion 11/11. Cardioversion was successful for 3 days but then she went back into afib late yesterday. She felt so much better in SR. Had more energy to do activities around the house. Now back to her usual fatigue. She is now on 200 mg qd but discussed that witll reload x 2 weeks and try DCCV again.  F/u 11/26. She continues in afib with RVR. She  is here to schedule cardioversion but she forgot to take eliquis one evening  in the past week.  She  has been reloading for the last 2 weeks on amiodarone. She does not want to wait another 3 weeks with missed anticoagulation  so will go with TEE guided cardioversion.  Today, she denies symptoms of palpitations, chest pain,  shortness of breath, orthopnea, PND, lower extremity edema, dizziness, presyncope, syncope, or neurologic sequela. The patient is tolerating medications without difficulties and is otherwise without complaint today.   Past Medical History:  Diagnosis Date  . Breast cancer ([place]) 07-13-1997   right  . Chronic back pain   . CKD (chronic kidney disease), stage III ([place])   . Complication of anesthesia   . Coronary artery calcification of native artery    a. seen on PET scan 04/2016.  [name] Hypertension   . Pericardial effusion    a. small by TEE 05/2016.  [name] Persistent atrial fibrillation   . Pleural effusion 11-08-2008  . PONV (postoperative nausea and vomiting)   . Radiation 08/17/2015-08/29/2015   lower thoracic spine 22.5 gray  . Radiation 01/17/16-01/24/16   right femur 20Gy   Past Surgical History:  Procedure Laterality Date  . ABDOMINAL HYSTERECTOMY  02-21-1982  . BREAST BIOPSY  07-13-1997  . CARDIOVERSION N/A 05/27/2016   Procedure: CARDIOVERSION;  Surgeon: [name], MD;  Location: [facility];  Service: Cardiovascular;  Laterality: N/A;  . CARDIOVERSION N/A 05/22/2018   Procedure: CARDIOVERSION;  Surgeon: Josue Hector, MD;  Location: [facility];  Service: Cardiovascular;  Laterality: N/A;  . DILATION AND CURETTAGE, DIAGNOSTIC / THERAPEUTIC  12-13-1981  . MASTECTOMY  07-29-1997  . PELVIC LAPAROSCOPY  11-06-1981  . POSTERIOR LUMBAR FUSION 4 LEVEL N/A 09/01/2015   Procedure: Thoracic six-Lumbar one fusion with arthrodesis pedicle screw stabilization and decompression;  Surgeon: Ashok Pall, MD;  Location: [facility];  Service: Neurosurgery;  Laterality: N/A;  T6-L1 fusion with arthrodesis pedicle screw stabilization and decompression  . TEE WITHOUT CARDIOVERSION N/A 05/27/2016   Procedure: TRANSESOPHAGEAL ECHOCARDIOGRAM (TEE);  Surgeon: [name], MD;  Location[facility];  Service: Cardiovascular;  Laterality: N/A;  . THORACENTESIS  10-18-2008    Current Outpatient  Medications  Medication Sig Dispense Refill  . acetaminophen (TYLENOL) 500 MG tablet Take 500 mg by mouth every 6 (six) hours as needed for moderate pain.    [name] amiodarone (PACERONE) 200 MG tablet Take 200 mg by mouth 2 (two) times daily.     [name] CARTIA XT 120 MG 24 hr capsule TAKE 1 CAPSULE BY MOUTH ONCE DAILY (Patient taking differently: Take 120 mg by mouth daily. ) 30 capsule 1  . denosumab (PROLIA) 60 MG/ML SOSY injection Inject 60 mg into the skin every 3 (three) months.    [name] ELIQUIS 5 MG TABS tablet TAKE 1 TABLET BY MOUTH TWICE DAILY (Patient taking differently: Take 5 mg by mouth 2 (two) times daily. ) 180 tablet 1  . fulvestrant (FASLODEX) 250 MG/5ML injection Inject 250 mg into the muscle every 30 (thirty) days. One injection each buttock over 1-2 minutes. Warm prior to use.     . furosemide (LASIX) 40 MG tablet TAKE 1 TABLET (40 MG TOTAL) BY MOUTH DAILY. 90 tablet 2  . IBRANCE 100 MG capsule TAKE 1 CAPSULE BY MOUTH ONCE DAILY WITH BREAKFAST. TAKE WHOLE WITH FOOD. TAKE FOR 3 WEEKS ON; 1 WEEK OFF--ALTERNATING. (Patient taking differently: Take 100 mg by mouth See admin instructions. Take 1 capsule (100 mg( daily with breakfast cyclically for 3 weeks on & 1 week off) 21 capsule 2  . loratadine (CLARITIN) 10 MG tablet Take 10 mg by mouth daily.     [name] losartan (COZAAR) 50 MG tablet Take 100 mg daily by mouth.  2  . Multiple Vitamins-Minerals (PRESERVISION AREDS 2+MULTI VIT PO) Take 1 tablet by mouth 2 (two) times daily.     Vladimir Faster Glycol-Propyl Glycol (LUBRICANT EYE DROPS) 0.4-0.3 % SOLN Place 1 drop into both eyes 3 (three) times daily as needed (for dry/irritated eyes.).    [name] Potassium 99 MG TABS Take 198 mg by mouth 2 (two) times daily.     . pravastatin (PRAVACHOL) 40 MG tablet Take 1 tablet (40 mg total) by mouth every evening. 90 tablet 3  . vitamin B-12 (CYANOCOBALAMIN) 1000 MCG tablet Take 1,000 mcg by mouth 2 (two) times daily.    . vitamin C (ASCORBIC ACID) 500 MG tablet Take 500  mg by mouth daily.       No current facility-administered medications for this encounter.     Allergies  Allergen Reactions  . Diphenhydramine Hcl Other (See Comments)    Severe headaches  . Codeine Sulfate Rash    REACTION: rash. Makes her jittery  . Metoprolol Other (See Comments)    Face Kept turning red  . Oseltamivir Phosphate Nausea And Vomiting and Rash  . Oxycodone-Acetaminophen Nausea And Vomiting    REACTION: emesis  . Prednisone Other (See Comments)    REACTION: hiatal hernia, runny  Nose, felt terrible  . Sudafed [Pseudoephedrine Hcl] Other (See Comments)    vertigo    Social History   Socioeconomic History  . Marital status: Single    Spouse name: Not on file  . Number of children: 0  . Years of education: Not on file  . Highest education level: Not on file  Occupational History  . Occupation:  Administraton for [employer]  Social Needs  . Financial resource strain: Not on file  . Food insecurity:    Worry: Not on file    Inability: Not on file  . Transportation needs:    Medical: Not on file    Non-medical: Not on file  Tobacco Use  . Smoking status: Former Smoker    Packs/day: 0.50    Years: 30.00    Pack years: 15.00    Types: Cigarettes    Last attempt to quit: 07/22/1996    Years since quitting: 21.9  . Smokeless tobacco: Never Used  . Tobacco comment: 1/2 up to 1 ppd  Substance and Sexual Activity  . Alcohol use: No    Alcohol/week: 0.0 standard drinks    Comment: hx of alcohol socially - hasn't had drink since March 1998  . Drug use: No  . Sexual activity: Not on file  Lifestyle  . Physical activity:    Days per week: Not on file    Minutes per session: Not on file  . Stress: Not on file  Relationships  . Social connections:    Talks on phone: Not on file    Gets together: Not on file    Attends religious service: Not on file    Active member of club or organization: Not on file    Attends meetings of clubs or  organizations: Not on file    Relationship status: Not on file  . Intimate partner violence:    Fear of current or ex partner: Not on file    Emotionally abused: Not on file    Physically abused: Not on file    Forced sexual activity: Not on file  Other Topics Concern  . Not on file  Social History Narrative  . Not on file    Family History  Problem Relation Age of Onset  . Heart disease Mother        with pacemaker   . Asthma Father   . Heart attack Maternal Grandfather 64  . Diabetes Paternal Grandmother   . Aortic stenosis Maternal Uncle   . Prostate cancer Maternal Uncle        dx unspecified age  . Prostate cancer Maternal Uncle        dx older than 50y  . Prostate cancer Maternal Uncle        dx older than 50y  . Prostate cancer Maternal Uncle 72       s/p prostatectomy  . Breast cancer Cousin 38       maternal 1st cousin; s/p lumpectomy and radiation  . Breast cancer Cousin        maternal 1st cousin dx mid-late 40s; s/p mastectomy    ROS- All systems are reviewed and negative except as per the HPI above  Physical Exam: Vitals:   06/16/18 1515  BP: 126/78  Pulse: (!) 127  Weight: 79.4 kg  Height: 5\' 2"  (1.575 m)   Wt Readings from Last 3 Encounters:  06/16/18 79.4 kg  06/02/18 79.5 kg  06/01/18 78.9 kg    Labs: Lab Results  Component Value Date   NA 141 06/02/2018   K 3.9 06/02/2018   CL 106 06/02/2018   CO2 26 06/02/2018   GLUCOSE 96 06/02/2018   BUN 22 06/02/2018   CREATININE 1.11 (H) 06/02/2018   CALCIUM 9.5 06/02/2018   PHOS 3.3 05/31/2016   MG 2.1 05/31/2016   Lab Results  Component Value Date   INR 0.95  01/15/2018   Lab Results  Component Value Date   CHOL 134 05/23/2016   HDL 31 (L) 05/23/2016   LDLCALC 54 05/23/2016   TRIG 247 (H) 05/23/2016     GEN- The patient is well appearing, alert and oriented x 3 today.   Head- normocephalic, atraumatic Eyes-  Sclera clear, conjunctiva pink Ears- hearing intact Oropharynx-  clear Neck- supple, no JVP Lymph- no cervical lymphadenopathy Lungs- Clear to ausculation bilaterally, normal work of breathing Heart- irregular rate and rhythm, no murmurs, rubs or gallops, PMI not laterally displaced GI- soft, NT, ND, + BS Extremities- no clubbing, cyanosis, or edema MS- no significant deformity or atrophy Skin- no rash or lesion Psych- euthymic mood, full affect Neuro- strength and sensation are intact  EKG-afib at 127 bpm,IRBBB, LAFB,  qrs int 92 ms, qtc 532 ms Epic records reviewed    Assessment and Plan: 1. Persistent  afib Recent successful cardioversion, 11/1, but ERAF after around 4 days Reloading amiodarone at 200 mg bid x 2 weeks and pt is willing to try another cardioversion   Continue Cardizem 120 mg daily  2. CHA2DS2VASc score of at least 4 Continue eliquis 5 mg bid Unfortunately, she missed a dose of eliquis last week Will schedule for a TEE guided cardioversion, 12/6  Continue amiodarone 200 mg bid until I see f/u in afib clinic one week after cardioversion    F/u in 2 weeks   Butch Penny C. Desiraye Rolfson, [facility] [address] [place], [place] [address] [phone]

## 2018-06-16 NOTE — Telephone Encounter (Signed)
New Message           *STAT* If patient is at the pharmacy, call can be transferred to refill team.   1. Which medications need to be refilled? (please list name of each medication and dose if known) Amiodarone 200 mg  2. Which pharmacy/location (including street and city if local pharmacy) is medication to be sent to? [facility]   3. Do they need a 30 day or 90 day supply? [place]

## 2018-06-16 NOTE — Progress Notes (Signed)
Primary Care Physician: [name], MD Referring Physician: Juanda Bond triage Cardiologist: Dr. Rolla Plate is a 77 y.o. female with a h/o paroxysmal atrial fibrillation that led to decompensated heart failure in late 2017, cardioversion 05/27/2016, with recurrence and a successful cardioversion in ED 07/25/2016, with low dose amiodarone treated with amiodarone, stopped November 2018. She is on anticoagulation with Eliquis without bleeding complications.  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  She walked into cancer center yesterday not feeling right and was found to be in afib with rvr. She was then asked to go [facility]. Dr. Loletha Grayer is on vacation. Triage nurse called here for advise, she was started on Cardizem 120 mg qd and asked to come today for an appointment. She started feeling better within 1-2 hours of taking Cardizem and today is in SR. Dis not tolerate BB 's in the past.She has been under a lot of stress from dealing with her cancer and new treatments and being the Executor of her mothers' estate who died this past 01-28-2023.  S/p cardioversion 11/11. Cardioversion was successful for 3 days but then she went back into afib late yesterday. She felt so much better in SR. Had more energy to do activities around the house. Now back to her usual fatigue. She is now on 200 mg qd but discussed that witll reload x 2 weeks and try DCCV again.  F/u 11/26. She continues in afib with RVR. She  is here to schedule cardioversion but she forgot to take eliquis one evening  in the past week.  She  has been reloading for the last 2 weeks on amiodarone. She does not want to wait another 3 weeks with missed anticoagulation  so will go with TEE guided cardioversion.  Today, she denies symptoms of palpitations, chest pain,  shortness of breath, orthopnea, PND, lower extremity edema, dizziness, presyncope, syncope, or neurologic sequela. The patient is tolerating medications without difficulties and is otherwise without complaint today.   Past Medical History:  Diagnosis Date  . Breast cancer ([place]) 07-13-1997   right  . Chronic back pain   . CKD (chronic kidney disease), stage III ([place])   . Complication of anesthesia   . Coronary artery calcification of native artery    a. seen on PET scan 04/2016.  [name] Hypertension   . Pericardial effusion    a. small by TEE 05/2016.  [name] Persistent atrial fibrillation   . Pleural effusion 11-08-2008  . PONV (postoperative nausea and vomiting)   . Radiation 08/17/2015-08/29/2015   lower thoracic spine 22.5 gray  . Radiation 01/17/16-01/24/16   right femur 20Gy   Past Surgical History:  Procedure Laterality Date  . ABDOMINAL HYSTERECTOMY  02-21-1982  . BREAST BIOPSY  07-13-1997  . CARDIOVERSION N/A 05/27/2016   Procedure: CARDIOVERSION;  Surgeon: [name], MD;  Location: [facility];  Service: Cardiovascular;  Laterality: N/A;  . CARDIOVERSION N/A 05/22/2018   Procedure: CARDIOVERSION;  Surgeon: Josue Hector, MD;  Location: [facility];  Service: Cardiovascular;  Laterality: N/A;  . DILATION AND CURETTAGE, DIAGNOSTIC / THERAPEUTIC  12-13-1981  . MASTECTOMY  07-29-1997  . PELVIC LAPAROSCOPY  11-06-1981  . POSTERIOR LUMBAR FUSION 4 LEVEL N/A 09/01/2015   Procedure: Thoracic six-Lumbar one fusion with arthrodesis pedicle screw stabilization and decompression;  Surgeon: Ashok Pall, MD;  Location: [facility];  Service: Neurosurgery;  Laterality: N/A;  T6-L1 fusion with arthrodesis pedicle screw stabilization and decompression  . TEE WITHOUT CARDIOVERSION N/A 05/27/2016   Procedure: TRANSESOPHAGEAL ECHOCARDIOGRAM (TEE);  Surgeon: [name], MD;  Location[name];  Service: Cardiovascular;  Laterality: N/A;  . THORACENTESIS  10-18-2008    Current Outpatient  Medications  Medication Sig Dispense Refill  . acetaminophen (TYLENOL) 500 MG tablet Take 500 mg by mouth every 6 (six) hours as needed for moderate pain.    [name] amiodarone (PACERONE) 200 MG tablet Take 200 mg by mouth 2 (two) times daily.     [name] CARTIA XT 120 MG 24 hr capsule TAKE 1 CAPSULE BY MOUTH ONCE DAILY (Patient taking differently: Take 120 mg by mouth daily. ) 30 capsule 1  . denosumab (PROLIA) 60 MG/ML SOSY injection Inject 60 mg into the skin every 3 (three) months.    [name] ELIQUIS 5 MG TABS tablet TAKE 1 TABLET BY MOUTH TWICE DAILY (Patient taking differently: Take 5 mg by mouth 2 (two) times daily. ) 180 tablet 1  . fulvestrant (FASLODEX) 250 MG/5ML injection Inject 250 mg into the muscle every 30 (thirty) days. One injection each buttock over 1-2 minutes. Warm prior to use.     . furosemide (LASIX) 40 MG tablet TAKE 1 TABLET (40 MG TOTAL) BY MOUTH DAILY. 90 tablet 2  . IBRANCE 100 MG capsule TAKE 1 CAPSULE BY MOUTH ONCE DAILY WITH BREAKFAST. TAKE WHOLE WITH FOOD. TAKE FOR 3 WEEKS ON; 1 WEEK OFF--ALTERNATING. (Patient taking differently: Take 100 mg by mouth See admin instructions. Take 1 capsule (100 mg( daily with breakfast cyclically for 3 weeks on & 1 week off) 21 capsule 2  . loratadine (CLARITIN) 10 MG tablet Take 10 mg by mouth daily.     [name] losartan (COZAAR) 50 MG tablet Take 100 mg daily by mouth.  2  . Multiple Vitamins-Minerals (PRESERVISION AREDS 2+MULTI VIT PO) Take 1 tablet by mouth 2 (two) times daily.     Vladimir Faster Glycol-Propyl Glycol (LUBRICANT EYE DROPS) 0.4-0.3 % SOLN Place 1 drop into both eyes 3 (three) times daily as needed (for dry/irritated eyes.).    [name] Potassium 99 MG TABS Take 198 mg by mouth 2 (two) times daily.     . pravastatin (PRAVACHOL) 40 MG tablet Take 1 tablet (40 mg total) by mouth every evening. 90 tablet 3  . vitamin B-12 (CYANOCOBALAMIN) 1000 MCG tablet Take 1,000 mcg by mouth 2 (two) times daily.    . vitamin C (ASCORBIC ACID) 500 MG tablet Take 500  mg by mouth daily.       No current facility-administered medications for this encounter.     Allergies  Allergen Reactions  . Diphenhydramine Hcl Other (See Comments)    Severe headaches  . Codeine Sulfate Rash    REACTION: rash. Makes her jittery  . Metoprolol Other (See Comments)    Face Kept turning red  . Oseltamivir Phosphate Nausea And Vomiting and Rash  . Oxycodone-Acetaminophen Nausea And Vomiting    REACTION: emesis  . Prednisone Other (See Comments)    REACTION: hiatal hernia, runny  Nose, felt terrible  . Sudafed [Pseudoephedrine Hcl] Other (See Comments)    vertigo    Social History   Socioeconomic History  . Marital status: Single    Spouse name: Not on file  . Number of children: 0  . Years of education: Not on file  . Highest education level: Not on file  Occupational History  . Occupation:  Administraton for [employer]  Social Needs  . Financial resource strain: Not on file  . Food insecurity:    Worry: Not on file    Inability: Not on file  . Transportation needs:    Medical: Not on file    Non-medical: Not on file  Tobacco Use  . Smoking status: Former Smoker    Packs/day: 0.50    Years: 30.00    Pack years: 15.00    Types: Cigarettes    Last attempt to quit: 07/22/1996    Years since quitting: 21.9  . Smokeless tobacco: Never Used  . Tobacco comment: 1/2 up to 1 ppd  Substance and Sexual Activity  . Alcohol use: No    Alcohol/week: 0.0 standard drinks    Comment: hx of alcohol socially - hasn't had drink since March 1998  . Drug use: No  . Sexual activity: Not on file  Lifestyle  . Physical activity:    Days per week: Not on file    Minutes per session: Not on file  . Stress: Not on file  Relationships  . Social connections:    Talks on phone: Not on file    Gets together: Not on file    Attends religious service: Not on file    Active member of club or organization: Not on file    Attends meetings of clubs or  organizations: Not on file    Relationship status: Not on file  . Intimate partner violence:    Fear of current or ex partner: Not on file    Emotionally abused: Not on file    Physically abused: Not on file    Forced sexual activity: Not on file  Other Topics Concern  . Not on file  Social History Narrative  . Not on file    Family History  Problem Relation Age of Onset  . Heart disease Mother        with pacemaker   . Asthma Father   . Heart attack Maternal Grandfather 64  . Diabetes Paternal Grandmother   . Aortic stenosis Maternal Uncle   . Prostate cancer Maternal Uncle        dx unspecified age  . Prostate cancer Maternal Uncle        dx older than 50y  . Prostate cancer Maternal Uncle        dx older than 50y  . Prostate cancer Maternal Uncle 72       s/p prostatectomy  . Breast cancer Cousin 52       maternal 1st cousin; s/p lumpectomy and radiation  . Breast cancer Cousin        maternal 1st cousin dx mid-late 61s; s/p mastectomy    ROS- All systems are reviewed and negative except as per the HPI above  Physical Exam: Vitals:   06/16/18 1515  BP: 126/78  Pulse: (!) 127  Weight: 79.4 kg  Height: 5\' 2"  (1.575 m)   Wt Readings from Last 3 Encounters:  06/16/18 79.4 kg  06/02/18 79.5 kg  06/01/18 78.9 kg    Labs: Lab Results  Component Value Date   NA 141 06/02/2018   K 3.9 06/02/2018   CL 106 06/02/2018   CO2 26 06/02/2018   GLUCOSE 96 06/02/2018   BUN 22 06/02/2018   CREATININE 1.11 (H) 06/02/2018   CALCIUM 9.5 06/02/2018   PHOS 3.3 05/31/2016   MG 2.1 05/31/2016   Lab Results  Component Value Date   INR 0.95  01/15/2018   Lab Results  Component Value Date   CHOL 134 05/23/2016   HDL 31 (L) 05/23/2016   LDLCALC 54 05/23/2016   TRIG 247 (H) 05/23/2016     GEN- The patient is well appearing, alert and oriented x 3 today.   Head- normocephalic, atraumatic Eyes-  Sclera clear, conjunctiva pink Ears- hearing intact Oropharynx-  clear Neck- supple, no JVP Lymph- no cervical lymphadenopathy Lungs- Clear to ausculation bilaterally, normal work of breathing Heart- irregular rate and rhythm, no murmurs, rubs or gallops, PMI not laterally displaced GI- soft, NT, ND, + BS Extremities- no clubbing, cyanosis, or edema MS- no significant deformity or atrophy Skin- no rash or lesion Psych- euthymic mood, full affect Neuro- strength and sensation are intact  EKG-afib at 127 bpm,IRBBB, LAFB,  qrs int 92 ms, qtc 532 ms Epic records reviewed    Assessment and Plan: 1. Persistent  afib Recent successful cardioversion, 11/1, but ERAF after around 4 days Reloading amiodarone at 200 mg bid x 2 weeks and pt is willing to try another cardioversion   Continue Cardizem 120 mg daily  2. CHA2DS2VASc score of at least 4 Continue eliquis 5 mg bid Unfortunately, she missed a dose of eliquis last week Will schedule for a TEE guided cardioversion, 12/6  Continue amiodarone 200 mg bid until I see f/u in afib clinic one week after cardioversion    F/u in 2 weeks   Butch Penny C. Carroll, [facility] [address] [place], [place] [address] [phone]

## 2018-06-16 NOTE — Telephone Encounter (Signed)
RN CALLED PATIENT TO VERIFY MEDICATION-- HAD TO LEAVE MESSAGE TO CAL BACK   BEFORE NEW PRESCRIPTION CAN BE SENT PHARMACY     PER OFFICE VISIT TODAY 06/16/18   WITH AFIB CLINIC - PATIENT  IS TO TAKE 200 MG TWICE A DAY FOR 1 WEEK UNTIL NEXT APPOINTMENT WITH DONNA CARROLL PER AVS  SUMMARY.   (  QUESTION IS -DOES THE PATIENT HAVE ENOUGH MEDICATION ?)

## 2018-06-16 NOTE — Patient Instructions (Signed)
Cardioversion scheduled for Friday, December 6th  - Arrive at the [facility] and go to admitting at [facility] not eat or drink anything after midnight the night prior to your procedure.  - Take all your morning medication with a sip of water prior to arrival.  - You will not be able to drive home after your procedure.

## 2018-06-17 ENCOUNTER — Other Ambulatory Visit (HOSPITAL_COMMUNITY): Payer: Self-pay | Admitting: *Deleted

## 2018-06-17 ENCOUNTER — Other Ambulatory Visit (HOSPITAL_COMMUNITY): Payer: Self-pay | Admitting: Nurse Practitioner

## 2018-06-17 MED FILL — AMIODARONE HCL 200 MG TAB: 200 | 28 days supply | Qty: 42 | Fill #0

## 2018-06-17 NOTE — Telephone Encounter (Signed)
SPOKE WITH PHARMICISTS AND VERBAL ORDER WAS GIVEN FOR AMIODARONE 200 MG BID FOR 2 WEEKS  PT AWARE MAY PICK UP BUT ALLOW AN HOUR FOR PROCESSING .Adonis Housekeeper

## 2018-06-22 MED FILL — CARTIA XT 120 MG CP24: 120 | 30 days supply | Qty: 30 | Fill #0

## 2018-06-26 ENCOUNTER — Ambulatory Visit (HOSPITAL_COMMUNITY): Payer: Medicare Other | Admitting: Anesthesiology

## 2018-06-26 ENCOUNTER — Other Ambulatory Visit (HOSPITAL_COMMUNITY): Payer: Self-pay | Admitting: *Deleted

## 2018-06-26 ENCOUNTER — Encounter (HOSPITAL_COMMUNITY): Payer: Self-pay | Admitting: *Deleted

## 2018-06-26 ENCOUNTER — Encounter (HOSPITAL_COMMUNITY)
Admission: RE | Disposition: A | Discharge status: Home / Self Care | Payer: Self-pay | Source: Ambulatory Visit | Attending: Cardiology

## 2018-06-26 ENCOUNTER — Ambulatory Visit (HOSPITAL_COMMUNITY)
Admission: RE | Admit: 2018-06-26 | Discharge: 2018-06-26 | Disposition: A | Discharge status: Home / Self Care | Payer: Medicare Other | Source: Ambulatory Visit | Attending: Cardiology | Admitting: Cardiology

## 2018-06-26 ENCOUNTER — Ambulatory Visit (HOSPITAL_BASED_OUTPATIENT_CLINIC_OR_DEPARTMENT_OTHER): Payer: Medicare Other

## 2018-06-26 ENCOUNTER — Telehealth: Payer: Self-pay | Admitting: Physician Assistant

## 2018-06-26 DIAGNOSIS — I4892 Unspecified atrial flutter: Secondary | ICD-10-CM | POA: Insufficient documentation

## 2018-06-26 DIAGNOSIS — Z9011 Acquired absence of right breast and nipple: Secondary | ICD-10-CM | POA: Diagnosis not present

## 2018-06-26 DIAGNOSIS — Z79899 Other long term (current) drug therapy: Secondary | ICD-10-CM | POA: Diagnosis not present

## 2018-06-26 DIAGNOSIS — I083 Combined rheumatic disorders of mitral, aortic and tricuspid valves: Secondary | ICD-10-CM | POA: Diagnosis not present

## 2018-06-26 DIAGNOSIS — Z853 Personal history of malignant neoplasm of breast: Secondary | ICD-10-CM | POA: Diagnosis not present

## 2018-06-26 DIAGNOSIS — I4819 Other persistent atrial fibrillation: Secondary | ICD-10-CM | POA: Diagnosis present

## 2018-06-26 DIAGNOSIS — Z7901 Long term (current) use of anticoagulants: Secondary | ICD-10-CM | POA: Diagnosis not present

## 2018-06-26 DIAGNOSIS — Z87891 Personal history of nicotine dependence: Secondary | ICD-10-CM | POA: Diagnosis not present

## 2018-06-26 DIAGNOSIS — Z885 Allergy status to narcotic agent status: Secondary | ICD-10-CM | POA: Insufficient documentation

## 2018-06-26 DIAGNOSIS — N183 Chronic kidney disease, stage 3 (moderate): Secondary | ICD-10-CM | POA: Diagnosis not present

## 2018-06-26 DIAGNOSIS — I129 Hypertensive chronic kidney disease with stage 1 through stage 4 chronic kidney disease, or unspecified chronic kidney disease: Secondary | ICD-10-CM | POA: Diagnosis not present

## 2018-06-26 DIAGNOSIS — Z9889 Other specified postprocedural states: Secondary | ICD-10-CM | POA: Insufficient documentation

## 2018-06-26 DIAGNOSIS — I340 Nonrheumatic mitral (valve) insufficiency: Secondary | ICD-10-CM | POA: Diagnosis not present

## 2018-06-26 DIAGNOSIS — I420 Dilated cardiomyopathy: Secondary | ICD-10-CM | POA: Diagnosis not present

## 2018-06-26 DIAGNOSIS — Z803 Family history of malignant neoplasm of breast: Secondary | ICD-10-CM | POA: Insufficient documentation

## 2018-06-26 DIAGNOSIS — Z8042 Family history of malignant neoplasm of prostate: Secondary | ICD-10-CM | POA: Insufficient documentation

## 2018-06-26 DIAGNOSIS — Z90710 Acquired absence of both cervix and uterus: Secondary | ICD-10-CM | POA: Diagnosis not present

## 2018-06-26 DIAGNOSIS — I2510 Atherosclerotic heart disease of native coronary artery without angina pectoris: Secondary | ICD-10-CM | POA: Insufficient documentation

## 2018-06-26 DIAGNOSIS — Z8249 Family history of ischemic heart disease and other diseases of the circulatory system: Secondary | ICD-10-CM | POA: Insufficient documentation

## 2018-06-26 DIAGNOSIS — I4891 Unspecified atrial fibrillation: Secondary | ICD-10-CM | POA: Diagnosis not present

## 2018-06-26 HISTORY — PX: TEE WITHOUT CARDIOVERSION: SHX5443

## 2018-06-26 HISTORY — PX: CARDIOVERSION: SHX1299

## 2018-06-26 LAB — POCT I-STAT 4, (NA,K, GLUC, HGB,HCT)
Glucose, Bld: 96 mg/dL (ref 70–99)
HCT: 35 % — ABNORMAL LOW (ref 36.0–46.0)
Hemoglobin: 11.9 g/dL — ABNORMAL LOW (ref 12.0–15.0)
Potassium: 4.1 mmol/L (ref 3.5–5.1)
Sodium: 140 mmol/L (ref 135–145)

## 2018-06-26 SURGERY — ECHOCARDIOGRAM, TRANSESOPHAGEAL
Anesthesia: Monitor Anesthesia Care

## 2018-06-26 MED ORDER — SODIUM CHLORIDE 0.9 % IV SOLN
INTRAVENOUS | Status: DC | PRN
  Administered 2018-06-26: 12:00:00 via INTRAVENOUS

## 2018-06-26 MED ORDER — SCOPOLAMINE 1 MG/3DAYS TD PT72
MEDICATED_PATCH | TRANSDERMAL | Status: DC | PRN
  Administered 2018-06-26: 1 via TRANSDERMAL

## 2018-06-26 MED ORDER — PROMETHAZINE HCL 25 MG/ML IJ SOLN: 6.2500 mg | INTRAMUSCULAR | Status: DC | PRN

## 2018-06-26 MED ORDER — SODIUM CHLORIDE 0.9 % IV SOLN: INTRAVENOUS | Status: DC

## 2018-06-26 MED ORDER — SCOPOLAMINE 1 MG/3DAYS TD PT72
MEDICATED_PATCH | TRANSDERMAL | Status: AC
  Filled 2018-06-26: qty 1

## 2018-06-26 MED ORDER — PROPOFOL 10 MG/ML IV BOLUS
INTRAVENOUS | Status: DC | PRN
  Administered 2018-06-26: 40 mg via INTRAVENOUS
  Administered 2018-06-26: 20 mg via INTRAVENOUS

## 2018-06-26 MED ORDER — PROPOFOL 500 MG/50ML IV EMUL
INTRAVENOUS | Status: DC | PRN
  Administered 2018-06-26: 100 ug/kg/min via INTRAVENOUS

## 2018-06-26 NOTE — Progress Notes (Signed)
  Echocardiogram Echocardiogram Transesophageal has been performed.  Maebel Marasco G Juanice Warburton 06/26/2018, 12:40 PM

## 2018-06-26 NOTE — Discharge Instructions (Signed)
TEE ° °YOU HAD AN CARDIAC PROCEDURE TODAY: Refer to the procedure report and other information in the discharge instructions given to you for any specific questions about what was found during the examination. If this information does not answer your questions, please call [facility] office at [phone] to clarify.  ° °DIET: Your first meal following the procedure should be a light meal and then it is ok to progress to your normal diet. A half-sandwich or bowl of soup is an example of a good first meal. Heavy or fried foods are harder to digest and may make you feel nauseous or bloated. Drink plenty of fluids but you should avoid alcoholic beverages for 24 hours. If you had a esophageal dilation, please see attached instructions for diet.  ° °ACTIVITY: Your care partner should take you home directly after the procedure. You should plan to take it easy, moving slowly for the rest of the day. You can resume normal activity the day after the procedure however YOU SHOULD NOT DRIVE, use power tools, machinery or perform tasks that involve climbing or major physical exertion for 24 hours (because of the sedation medicines used during the test).  ° °SYMPTOMS TO REPORT IMMEDIATELY: °A cardiologist can be reached at any hour. Please call [phone] for any of the following symptoms:  °Vomiting of blood or coffee ground material  °New, significant abdominal pain  °New, significant chest pain or pain under the shoulder blades  °Painful or persistently difficult swallowing  °New shortness of breath  °Black, tarry-looking or red, bloody stools ° °FOLLOW UP:  °Please also call with any specific questions about appointments or follow up tests. ° °Electrical Cardioversion, Care After °This sheet gives you information about how to care for yourself after your procedure. Your health care provider may also give you more specific instructions. If you have problems or questions, contact your health care provider. °What can I  expect after the procedure? °After the procedure, it is common to have: °· Some redness on the skin where the shocks were given. ° °Follow these instructions at home: °· Do not drive for 24 hours if you were given a medicine to help you relax (sedative). °· Take over-the-counter and prescription medicines only as told by your health care provider. °· Ask your health care provider how to check your pulse. Check it often. °· Rest for 48 hours after the procedure or as told by your health care provider. °· Avoid or limit your caffeine use as told by your health care provider. °Contact a health care provider if: °· You feel like your heart is beating too quickly or your pulse is not regular. °· You have a serious muscle cramp that does not go away. °Get help right away if: °· You have discomfort in your chest. °· You are dizzy or you feel faint. °· You have trouble breathing or you are short of breath. °· Your speech is slurred. °· You have trouble moving an arm or leg on one side of your body. °· Your fingers or toes turn cold or blue. °This information is not intended to replace advice given to you by your health care provider. Make sure you discuss any questions you have with your health care provider. °Document Released: 04/28/2013 Document Revised: 02/09/2016 Document Reviewed: 01/12/2016 °Elsevier Interactive Patient Education © 2018 Elsevier Inc. ° °

## 2018-06-26 NOTE — Addendum Note (Signed)
Addendum  created 06/26/18 1643 by Leonor Liv, CRNA   Intraprocedure Meds edited

## 2018-06-26 NOTE — Anesthesia Procedure Notes (Signed)
Procedure Name: MAC Date/Time: 06/26/2018 12:00 PM Performed by: Leonor Liv, CRNA Oxygen Delivery Method: Nasal cannula Placement Confirmation: positive ETCO2 Dental Injury: Teeth and Oropharynx as per pre-operative assessment

## 2018-06-26 NOTE — Transfer of Care (Signed)
Immediate Anesthesia Transfer of Care Note  Patient: Lisa [name]  Procedure(s) Performed: TRANSESOPHAGEAL ECHOCARDIOGRAM (TEE) (N/A ) CARDIOVERSION (N/A )  Patient Location: Endoscopy Unit  Anesthesia Type:MAC  Level of Consciousness: drowsy  Airway & Oxygen Therapy: Patient Spontanous Breathing and Patient connected to nasal cannula oxygen  Post-op Assessment: Report given to RN and Post -op Vital signs reviewed and stable  Post vital signs: Reviewed and stable  Last Vitals:  Vitals Value Taken Time  BP 118/54 06/26/2018 12:30 PM  Temp 36.5 C 06/26/2018 12:30 PM  Pulse 72 06/26/2018 12:35 PM  Resp 22 06/26/2018 12:35 PM  SpO2 99 % 06/26/2018 12:35 PM  Vitals shown include unvalidated device data.  Last Pain:  Vitals:   06/26/18 1230  TempSrc: Oral  PainSc: 0-No pain      Patients Stated Pain Goal: (P) 4 (17/71/16 5790)  Complications: No apparent anesthesia complications

## 2018-06-26 NOTE — Interval H&P Note (Signed)
History and Physical Interval Note:  06/26/2018 11:00 AM  Lisa [name]  has presented today for surgery, with the diagnosis of A-FIB  The various methods of treatment have been discussed with the patient and family. After consideration of risks, benefits and other options for treatment, the patient has consented to  Procedure(s): TRANSESOPHAGEAL ECHOCARDIOGRAM (TEE) (N/A) CARDIOVERSION (N/A) as a surgical intervention .  The patient's history has been reviewed, patient examined, no change in status, stable for surgery.  I have reviewed the patient's chart and labs.  Questions were answered to the patient's satisfaction.     Ena Dawley

## 2018-06-26 NOTE — Anesthesia Postprocedure Evaluation (Signed)
Anesthesia Post Note  Patient: Lisa [name]  Procedure(s) Performed: TRANSESOPHAGEAL ECHOCARDIOGRAM (TEE) (N/A ) CARDIOVERSION (N/A )     Patient location during evaluation: Endoscopy Anesthesia Type: MAC Level of consciousness: awake Pain management: pain level controlled Vital Signs Assessment: post-procedure vital signs reviewed and stable Respiratory status: spontaneous breathing Cardiovascular status: stable Postop Assessment: no apparent nausea or vomiting Anesthetic complications: no    Last Vitals:  Vitals:   06/26/18 1057 06/26/18 1230  BP: (!) (P) 149/93 (!) 118/54  Pulse: (P) 90 77  Resp: (!) (P) 24 16  Temp: (P) 36.8 C 36.5 C  SpO2: (P) 97% 96%    Last Pain:  Vitals:   06/26/18 1230  TempSrc: Oral  PainSc: 0-No pain   Pain Goal: Patients Stated Pain Goal: (P) 4 (06/26/18 1057)               Huston Foley

## 2018-06-26 NOTE — Anesthesia Preprocedure Evaluation (Addendum)
Anesthesia Evaluation  Patient identified by MRN, date of birth, ID band Patient awake    Reviewed: Allergy & Precautions, NPO status , Patient's Chart, lab work & pertinent test results  History of Anesthesia Complications (+) PONV and history of anesthetic complications  Airway Mallampati: II  TM Distance: >3 FB Neck ROM: Full    Dental no notable dental hx. (+) Teeth Intact   Pulmonary former smoker,    Pulmonary exam normal breath sounds clear to auscultation       Cardiovascular hypertension, Pt. on medications + CAD and +CHF  Normal cardiovascular exam Rhythm:Irregular Rate:Normal     Neuro/Psych negative neurological ROS  negative psych ROS   GI/Hepatic negative GI ROS, Neg liver ROS,   Endo/Other  negative endocrine ROS  Renal/GU Renal InsufficiencyRenal diseaseCKD III  negative genitourinary   Musculoskeletal negative musculoskeletal ROS (+)   Abdominal (+) + obese,   Peds negative pediatric ROS (+)  Hematology   Anesthesia Other Findings Lisa [name]  ECHO LIMITED WO IMAGE ENHANCING AGENT W COLOR & SPECT  Order# [id number]  Reading physician: Thayer Headings, MD Ordering physician: [name], MD Study date: 12/11/16 Result Notes for ECHOCARDIOGRAM LIMITED   Notes recorded by TruittDionne Bucy, CMA on 12/17/2016 at 4:35 PM EDT Patient advised of medication adjustment. Medical record updated. ------  Notes recorded by Diana Eves, CMA on 12/17/2016 at 4:12 PM EDT lmtcb ------  Notes recorded by [name], MD on 12/14/2016 at 9:12 PM EDT Neuropathy can be associated with amiodarone (many other possible causes, including chemo). Let's try reducing the amiodarone to 100 mg daily (one half tab daily). MCr ------  Notes recorded by Diana Eves, CMA on 12/13/2016 at 4:29 PM EDT Called patient with results. Patient verbalized understanding and agreed with plan. Patient reports  some complications since starting amiodarone and questions whether the medication is involved: >unstable balance - had to stop physical therapy d/t lumbar pain from being unsteady >all 10 fingertips are numb - difficulty picking up items and turning pages in a book ------  Notes recorded by Truitt, Dionne Bucy, CMA on 12/13/2016 at 3:32 PM EDT lmtcb ------  Notes recorded by [name], MD on 12/12/2016 at 1:38 PM EDT Heart pumping function is normal, but there is evidence of excess fluid buildup. She having trouble breathing or any swelling?. If so, she would benefit from some more diuretic (she has any shortness of breath, please increase furosemide 06/25/1937 milligrams tablet once daily). Please remind her to monitor weight daily and avoid sodium rich foods. There is no pericardial fluid. This is great news, since there was concern about pericardial involvement with her malignancy.   Study Result   Result status: Final result                          [facility]                        [address] [place], [place] [address]                            [phone]  ------------------------------------------------------------------- Transthoracic Echocardiography  Patient:    Lisa [name], Lisa [name] MR #:       [id number]  Study Date: July 14, 202018 Gender:     F Age:        77 Height:     157.5 cm Weight:     74.7 kg BSA:        1.83 m^2 Pt. Status: Room:   ATTENDING    Mertie Moores, M.D.  SONOGRAPHER  Diamond Nickel  ORDERING     [name], MD  REFERRING    [name], MD  PERFORMING   [facility], Outpatient  cc:  ------------------------------------------------------------------- LV EF: 60% -   65%  -------------------------------------------------------------------    Reproductive/Obstetrics negative OB ROS                             Anesthesia Physical  Anesthesia Plan  ASA: III  Anesthesia  Plan: MAC   Post-op Pain Management:    Induction:   PONV Risk Score and Plan: 4 or greater and Treatment may vary due to age or medical condition, Propofol infusion, Ondansetron, Dexamethasone and Scopolamine patch - Pre-op  Airway Management Planned: Mask  Additional Equipment:   Intra-op Plan:   Post-operative Plan:   Informed Consent: I have reviewed the patients History and Physical, chart, labs and discussed the procedure including the risks, benefits and alternatives for the proposed anesthesia with the patient or authorized representative who has indicated his/her understanding and acceptance.   Dental advisory given  Plan Discussed with: CRNA  Anesthesia Plan Comments:       Anesthesia Quick Evaluation

## 2018-06-26 NOTE — Telephone Encounter (Signed)
Paged by answering service to confirm amiodarone dose. She had successful TEE/SCCV today. Per Donna's note advised to continue Amiodarone 200mg  BID until seen in afib clinic. Patient voice understanding of current recommended dosage.

## 2018-06-26 NOTE — CV Procedure (Signed)
   Transesophageal Echocardiogram Note  KARMAN BISWELL [id number] [birth year]  Procedure: Transesophageal Echocardiogram Indications: atrial fibrillation  Procedure Details Consent: Obtained Time Out: Verified patient identification, verified procedure, site/side was marked, verified correct patient position, special equipment/implants available, Radiology Safety Procedures followed,  medications/allergies/relevent history reviewed, required imaging and test results available.  Performed  Medications: During this procedure the patient is administered iv Propofol to achieve and deep conscious sedation by anesthesia staff.  The patient's heart rate, blood pressure, and oxygen saturation are monitored continuously during the procedure. The period of deep sedation is 30 minutes, of which I was present face-to-face 100% of this time.  No source of intracardiac emboli was identified, clear LAA.   Complications: No apparent complications Patient did tolerate procedure well.  Ena Dawley, MD, [facility] 06/26/2018, 12:35 PM     Cardioversion Note  LEVAEH VICE [id number] 12-Oct-1940  Procedure: DC Cardioversion Indications: atrial fibrillation  Procedure Details Consent: Obtained Time Out: Verified patient identification, verified procedure, site/side was marked, verified correct patient position, special equipment/implants available, Radiology Safety Procedures followed,  medications/allergies/relevent history reviewed, required imaging and test results available.  Performed  During this procedure the patient is administered iv Propofol to achieve and deep conscious sedation by anesthesia staff.  The patient's heart rate, blood pressure, and oxygen saturation are monitored continuously during the procedure. The period of deep sedation is 30 minutes, of which I was present face-to-face 100% of this time.  Synchronous cardioversion was performed at 120 joules.  The cardioversion  was successful.  Complications: No apparent complications Patient did tolerate procedure well.  Ena Dawley, MD, [facility] 06/26/2018, 12:35 PM

## 2018-06-29 ENCOUNTER — Telehealth: Payer: Self-pay | Admitting: Hematology and Oncology

## 2018-06-29 ENCOUNTER — Ambulatory Visit (HOSPITAL_COMMUNITY)
Admission: RE | Admit: 2018-06-29 | Discharge: 2018-06-29 | Disposition: A | Discharge status: Home / Self Care | Payer: Medicare Other | Source: Ambulatory Visit | Attending: Hematology and Oncology | Admitting: Hematology and Oncology

## 2018-06-29 ENCOUNTER — Encounter (HOSPITAL_COMMUNITY): Payer: Self-pay | Admitting: Cardiology

## 2018-06-29 DIAGNOSIS — C7951 Secondary malignant neoplasm of bone: Secondary | ICD-10-CM | POA: Diagnosis not present

## 2018-06-29 DIAGNOSIS — J90 Pleural effusion, not elsewhere classified: Secondary | ICD-10-CM | POA: Diagnosis not present

## 2018-06-29 DIAGNOSIS — C782 Secondary malignant neoplasm of pleura: Secondary | ICD-10-CM | POA: Diagnosis present

## 2018-06-29 DIAGNOSIS — C787 Secondary malignant neoplasm of liver and intrahepatic bile duct: Secondary | ICD-10-CM | POA: Insufficient documentation

## 2018-06-29 DIAGNOSIS — C50911 Malignant neoplasm of unspecified site of right female breast: Secondary | ICD-10-CM | POA: Insufficient documentation

## 2018-06-29 MED ORDER — IOHEXOL 300 MG/ML  SOLN
75.0000 mL | Freq: Once | INTRAMUSCULAR | Status: AC | PRN
  Administered 2018-06-29: 75 mL via INTRAVENOUS

## 2018-06-29 MED ORDER — SODIUM CHLORIDE (PF) 0.9 % IJ SOLN
INTRAMUSCULAR | Status: AC
  Filled 2018-06-29: qty 50

## 2018-06-29 NOTE — Telephone Encounter (Signed)
I reviewed the CT chest abdomen pelvis of the [facility].  She has clear-cut evidence of progression of disease.  This is in spite of changing therapy to Faslodex along with Ibrance. I did not leave a message on her voicemail. I would like her to get a liver biopsy which could be sent for Caris molecular testing.  This will provide [place] information if she is eligible to receive PI 3 kinase inhibitors or NTRK inhibitors. If there are no actionable mutations then the treatment options include chemotherapy with Abraxane versus exemestane with everolimus. I will request Mendel Ryder to provide her with all of the required information and we will plan to switch her treatment based upon molecular test results. I will see her back in 1 month to discuss the new treatment plan.

## 2018-07-01 ENCOUNTER — Telehealth: Payer: Self-pay | Admitting: Hematology and Oncology

## 2018-07-01 ENCOUNTER — Inpatient Hospital Stay: Payer: Medicare Other | Attending: Hematology and Oncology

## 2018-07-01 ENCOUNTER — Encounter: Payer: Self-pay | Admitting: Adult Health

## 2018-07-01 ENCOUNTER — Inpatient Hospital Stay: Payer: Medicare Other | Admitting: Adult Health

## 2018-07-01 ENCOUNTER — Inpatient Hospital Stay: Payer: Medicare Other

## 2018-07-01 VITALS — BP 130/70 | HR 74 | Temp 99.0°F | Resp 17 | Ht 62.0 in | Wt 175.0 lb

## 2018-07-01 DIAGNOSIS — Z87891 Personal history of nicotine dependence: Secondary | ICD-10-CM

## 2018-07-01 DIAGNOSIS — C7802 Secondary malignant neoplasm of left lung: Secondary | ICD-10-CM

## 2018-07-01 DIAGNOSIS — C787 Secondary malignant neoplasm of liver and intrahepatic bile duct: Secondary | ICD-10-CM

## 2018-07-01 DIAGNOSIS — C7951 Secondary malignant neoplasm of bone: Secondary | ICD-10-CM | POA: Diagnosis not present

## 2018-07-01 DIAGNOSIS — D701 Agranulocytosis secondary to cancer chemotherapy: Secondary | ICD-10-CM

## 2018-07-01 DIAGNOSIS — C50911 Malignant neoplasm of unspecified site of right female breast: Secondary | ICD-10-CM | POA: Insufficient documentation

## 2018-07-01 DIAGNOSIS — Z9011 Acquired absence of right breast and nipple: Secondary | ICD-10-CM | POA: Insufficient documentation

## 2018-07-01 DIAGNOSIS — Z79811 Long term (current) use of aromatase inhibitors: Secondary | ICD-10-CM

## 2018-07-01 DIAGNOSIS — C782 Secondary malignant neoplasm of pleura: Secondary | ICD-10-CM | POA: Diagnosis not present

## 2018-07-01 DIAGNOSIS — N183 Chronic kidney disease, stage 3 (moderate): Secondary | ICD-10-CM

## 2018-07-01 DIAGNOSIS — T451X5A Adverse effect of antineoplastic and immunosuppressive drugs, initial encounter: Secondary | ICD-10-CM

## 2018-07-01 DIAGNOSIS — G893 Neoplasm related pain (acute) (chronic): Secondary | ICD-10-CM | POA: Diagnosis not present

## 2018-07-01 DIAGNOSIS — C7801 Secondary malignant neoplasm of right lung: Secondary | ICD-10-CM

## 2018-07-01 DIAGNOSIS — I130 Hypertensive heart and chronic kidney disease with heart failure and stage 1 through stage 4 chronic kidney disease, or unspecified chronic kidney disease: Secondary | ICD-10-CM | POA: Insufficient documentation

## 2018-07-01 DIAGNOSIS — Z5111 Encounter for antineoplastic chemotherapy: Secondary | ICD-10-CM | POA: Diagnosis not present

## 2018-07-01 DIAGNOSIS — Z923 Personal history of irradiation: Secondary | ICD-10-CM

## 2018-07-01 DIAGNOSIS — C50919 Malignant neoplasm of unspecified site of unspecified female breast: Secondary | ICD-10-CM

## 2018-07-01 LAB — CMP (CANCER CENTER ONLY)
ALT: 19 U/L (ref 0–44)
AST: 42 U/L — ABNORMAL HIGH (ref 15–41)
Albumin: 3.5 g/dL (ref 3.5–5.0)
Alkaline Phosphatase: 117 U/L (ref 38–126)
Anion gap: 11 (ref 5–15)
BUN: 19 mg/dL (ref 8–23)
CO2: 22 mmol/L (ref 22–32)
Calcium: 8.8 mg/dL — ABNORMAL LOW (ref 8.9–10.3)
Chloride: 108 mmol/L (ref 98–111)
Creatinine: 1.12 mg/dL — ABNORMAL HIGH (ref 0.44–1.00)
GFR, Est AFR Am: 56 mL/min — ABNORMAL LOW (ref >60)
GFR, Estimated: 48 mL/min — ABNORMAL LOW (ref >60)
Glucose, Bld: 85 mg/dL (ref 70–99)
Potassium: 4 mmol/L (ref 3.5–5.1)
Sodium: 141 mmol/L (ref 135–145)
Total Bilirubin: 0.8 mg/dL (ref 0.3–1.2)
Total Protein: 7.1 g/dL (ref 6.5–8.1)

## 2018-07-01 LAB — CBC WITH DIFFERENTIAL (CANCER CENTER ONLY)
Abs Immature Granulocytes: 0.02 10*3/uL (ref 0.00–0.07)
Basophils Absolute: 0.1 10*3/uL (ref 0.0–0.1)
Basophils Relative: 2 %
Eosinophils Absolute: 0.1 10*3/uL (ref 0.0–0.5)
Eosinophils Relative: 3 %
HCT: 36.4 % (ref 36.0–46.0)
Hemoglobin: 12 g/dL (ref 12.0–15.0)
Immature Granulocytes: 1 %
Lymphocytes Relative: 21 %
Lymphs Abs: 0.5 10*3/uL — ABNORMAL LOW (ref 0.7–4.0)
MCH: 34.6 pg — ABNORMAL HIGH (ref 26.0–34.0)
MCHC: 33 g/dL (ref 30.0–36.0)
MCV: 104.9 fL — ABNORMAL HIGH (ref 80.0–100.0)
Monocytes Absolute: 0.3 10*3/uL (ref 0.1–1.0)
Monocytes Relative: 11 %
Neutro Abs: 1.5 10*3/uL — ABNORMAL LOW (ref 1.7–7.7)
Neutrophils Relative %: 62 %
Platelet Count: 214 10*3/uL (ref 150–400)
RBC: 3.47 MIL/uL — ABNORMAL LOW (ref 3.87–5.11)
RDW: 15.1 % (ref 11.5–15.5)
WBC Count: 2.5 10*3/uL — ABNORMAL LOW (ref 4.0–10.5)
nRBC: 0 % (ref 0.0–0.2)

## 2018-07-01 MED ORDER — DENOSUMAB 120 MG/1.7ML ~~LOC~~ SOLN
SUBCUTANEOUS | Status: AC
  Filled 2018-07-01: qty 1.7

## 2018-07-01 MED ORDER — DENOSUMAB 120 MG/1.7ML ~~LOC~~ SOLN
120.0000 mg | Freq: Once | SUBCUTANEOUS | Status: AC
  Administered 2018-07-01: 120 mg via SUBCUTANEOUS

## 2018-07-01 MED ORDER — FULVESTRANT 250 MG/5ML IM SOLN
500.0000 mg | Freq: Once | INTRAMUSCULAR | Status: AC
  Administered 2018-07-01: 500 mg via INTRAMUSCULAR

## 2018-07-01 MED ORDER — FULVESTRANT 250 MG/5ML IM SOLN
INTRAMUSCULAR | Status: AC
  Filled 2018-07-01: qty 5

## 2018-07-01 NOTE — Assessment & Plan Note (Addendum)
Metastatic breast cancer tolungs and bone: Clinically improving onweekly taxol beginning 05-09-16, several delays including for counts, rapid A fib, respiratory infection etc  Treatment summary: Taxol days 1 and 8 every 3 weeks with Neupogen along with Xgeva; completed 4cycles from 05/09/2016-08/29/2016  Atrial fibrillation: Recurrent with RVR, cardioverted 07/25/2016, amiodarone, metoprolol and Lasix for CHF (Dr. Sallyanne Kuster) CKD stage III Pathologic compression fracture T10 status post radiation and laminectomy T6-L1 09/01/2015, radiation to right iliac area 05/2016  Rebiopsy of the lung nodule 09/19/2016: Metastatic breast cancer that is HER-2 negative, -------------------------------------------------------------------------------------------------------------------------------------------- Current treatment: LetrozolewithPalbociclib started 10/01/2016 switched to Faslodex with Ibrance 02/03/2018  CT chest abdomen and pelvis on 06/29/18 show liver, lung, bone progression.  We will send foundation 1 and caris molecular testing off of her 12/2017 liver biopsy.  I reviewed that there are potentially a few other options, including oral medications, and chemotherapy.  She understands this and knows we have to wait for this additional testing before we can discuss further.    Since Lisa [name] is tolerating her current treatment well, she will finish out her current pack of Ibrance, her anc is 1.5 today.  She will also receive Xgeva and Fulvestrant today.  We reviewed that she should take extra calcium on the days that she is getting Xgeva.  She is planning on taking extra TUMS today.    We reviewed Lisa [name]'s HCPOA, and she is in the process of updating her advanced directives.    Lisa [name] will return in 4 weeks for labs and f/u with Dr. Lindi Adie.    This above plan was reviewed with Dr. Jana Hakim in Dr. Geralyn Flash absence as well as Dr. Lindi Adie over the phone.

## 2018-07-01 NOTE — Progress Notes (Signed)
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 Findings concerning for liver metastases, numerous pulmonary nodules bilaterally    05/09/2016 - 09/18/2016 Chemotherapy    Taxol weekly initially and later changed her day 1 day 8 every 3 weeks (complicated by A. fib with RVR and prolonged respiratory infection) along with Xgeva     09/19/2016 Procedure    Biopsy of lung mass: Metastatic carcinoma consistent with breast origin, HER-2 negative ratio 1.37    10/01/2016 -  Anti-estrogen oral therapy    LetrozolewithPalbociclib (Ibrance)started 10/01/2016 switched to Faslodex with Ibrance 02/03/2018    06/23/2017 Imaging    CT chest abdomen pelvis: No evidence of progression of metastatic disease, bone metastases stable right-sided pleural metastases and pleural effusions are  stable     CURRENT THERAPY: Faslodex and Ibrance  INTERVAL HISTORY: JAYLEIGH NOTARIANNI 77 y.o. female returns for discussion of her CT chest abdomen and pelvis results.  They show bone and liver progression, she also has small pleural effusion.  She denies any new pain, other than occasional zings in her RUQ that happen when the weather is bad.  She is eating two meals a day and monitors her diet and intake.  She is currently on the [place], and has no side effects she notes from ibrance or the Faslodex injections.    Patient Active Problem List   Diagnosis Date Noted  . Encounter for monitoring amiodarone therapy 06/04/2017  . Chronic diastolic CHF (congestive heart failure) ([place]) 07/29/2016  . At high risk for falls 07/27/2016  . Chemotherapy induced neutropenia ([place]) 07/06/2016  . Leukopenia due to antineoplastic chemotherapy ([place]) 06/29/2016  . CKD (chronic kidney disease) stage 3, GFR 30-59 ml/min (HCC) 06/14/2016  . Chronic anticoagulation 06/14/2016  . Pericardial effusion - small by TEE 05/27/16 05/28/2016  . Pleural effusion   . Paroxysmal atrial fibrillation (HCC)   . Shortness of breath   . Atrial fibrillation with RVR ([place]) 05/22/2016  . Genetic testing 05/19/2016  . 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 Breast cancer metastasized to bone, right ([place]) 05/07/2016  . Breast cancer metastasized to multiple sites, right ([place]) 05/07/2016  . Encounter for antineoplastic chemotherapy 03/06/2016  . Breast cancer metastasized to multiple sites ([place]) 12/09/2015  .  Urinary frequency 11/10/2015  . High risk medication use 11/10/2015  . Postoperative anemia due to acute blood loss 09/14/2015  . Metastatic breast cancer ([place]) 09/01/2015  . Cord compression ([place]) 08/31/2015  . Pathologic compression fracture of thoracic vertebra ([place]) 08/31/2015  . Pathologic fracture of thoracic  vertebrae 08/26/2015  . C. difficile colitis 08/12/2015  . Acute kidney injury superimposed on CKD ([place]) 08/12/2015  . Unintentional weight loss 08/12/2015  . Cancer associated pain 08/12/2015  . Hypokalemia 08/08/2015  . Carcinoma of breast metastatic to bone ([place]) 08/07/2015  . Enteritis due to Clostridium difficile 08/06/2015  . Sepsis ([place]) 08/02/2015  . Diarrhea 08/02/2015  . AKI (acute kidney injury) ([place]) 08/02/2015  . Acute kidney injury ([place])   . Bone metastases ([place])   . 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 PLEURAL EFFUSION 10/13/2008  . ACUTE BRONCHOSPASM 06/27/2008  . Disorder of bone and cartilage 02/03/2008  . UNS ADVRS EFF UNS RX MEDICINAL&BIOLOGICAL SBSTNC 01/04/2008  . HLD (hyperlipidemia) 07/24/2007  . Essential hypertension 07/24/2007  . Allergic rhinitis 07/24/2007    is allergic to diphenhydramine hcl; codeine sulfate; metoprolol; oseltamivir phosphate; oxycodone-acetaminophen; prednisone; and sudafed [pseudoephedrine hcl].  MEDICAL HISTORY: Past Medical History:  Diagnosis Date  . Breast cancer ([place]) 07-13-1997   right  . Chronic back pain   . CKD (chronic kidney disease), stage III ([place])   . Complication of anesthesia   . Coronary artery calcification of native artery    a. seen on PET scan 04/2016.  [name] Hypertension   . Pericardial effusion    a. small by TEE 05/2016.  [name] Persistent atrial fibrillation   . Pleural effusion 11-08-2008  . PONV (postoperative nausea and vomiting)   . Radiation 08/17/2015-08/29/2015   lower thoracic spine 22.5 gray  . Radiation 01/17/16-01/24/16   right femur 20Gy    SURGICAL HISTORY: Past Surgical History:  Procedure Laterality Date  . ABDOMINAL HYSTERECTOMY  02-21-1982  . BREAST BIOPSY  07-13-1997  . CARDIOVERSION N/A 05/27/2016   Procedure: CARDIOVERSION;  Surgeon: [name], MD;  Location: [facility] ENDOSCOPY;  Service: Cardiovascular;  Laterality: N/A;  . CARDIOVERSION N/A 05/22/2018   Procedure:  CARDIOVERSION;  Surgeon: Josue Hector, MD;  Location: [facility] ENDOSCOPY;  Service: Cardiovascular;  Laterality: N/A;  . CARDIOVERSION N/A 06/26/2018   Procedure: CARDIOVERSION;  Surgeon: Dorothy Spark, MD;  Location: [facility];  Service: Cardiovascular;  Laterality: N/A;  . DILATION AND CURETTAGE, DIAGNOSTIC / THERAPEUTIC  12-13-1981  . MASTECTOMY  07-29-1997  . PELVIC LAPAROSCOPY  11-06-1981  . POSTERIOR LUMBAR FUSION 4 LEVEL N/A 09/01/2015   Procedure: Thoracic six-Lumbar one fusion with arthrodesis pedicle screw stabilization and decompression;  Surgeon: Ashok Pall, MD;  Location: [facility]URO ORS;  Service: Neurosurgery;  Laterality: N/A;  T6-L1 fusion with arthrodesis pedicle screw stabilization and decompression  . TEE WITHOUT CARDIOVERSION N/A 05/27/2016   Procedure: TRANSESOPHAGEAL ECHOCARDIOGRAM (TEE);  Surgeon: [name], MD;  Location: [facility] ENDOSCOPY;  Service: Cardiovascular;  Laterality: N/A;  . TEE WITHOUT CARDIOVERSION N/A 06/26/2018   Procedure: TRANSESOPHAGEAL ECHOCARDIOGRAM (TEE);  Surgeon: Dorothy Spark, MD;  Location: [facility] ENDOSCOPY;  Service: Cardiovascular;  Laterality: N/A;  . THORACENTESIS  10-18-2008    SOCIAL HISTORY: Social History   Socioeconomic History  . Marital status: Single    Spouse name: Not on file  . Number of children: 0  . Years of education: Not on file  . Highest education level: Not on file  Occupational History  . Occupation: Administraton for  [employer]  Social Needs  . Financial resource strain: Not on file  . Food insecurity:    Worry: Not on file    Inability: Not on file  . Transportation needs:    Medical: Not on file    Non-medical: Not on file  Tobacco Use  . Smoking status: Former Smoker    Packs/day: 0.50    Years: 30.00    Pack years: 15.00    Types: Cigarettes    Last attempt to quit: 07/22/1996    Years since quitting: 21.9  . Smokeless tobacco: Never Used  . Tobacco comment: 1/2 up to 1 ppd  Substance  and Sexual Activity  . Alcohol use: No    Alcohol/week: 0.0 standard drinks    Comment: hx of alcohol socially - hasn't had drink since March 1998  . Drug use: No  . Sexual activity: Not on file  Lifestyle  . Physical activity:    Days per week: Not on file    Minutes per session: Not on file  . Stress: Not on file  Relationships  . Social connections:    Talks on phone: Not on file    Gets together: Not on file    Attends religious service: Not on file    Active member of club or organization: Not on file    Attends meetings of clubs or organizations: Not on file    Relationship status: Not on file  . Intimate partner violence:    Fear of current or ex partner: Not on file    Emotionally abused: Not on file    Physically abused: Not on file    Forced sexual activity: Not on file  Other Topics Concern  . Not on file  Social History Narrative  . Not on file    FAMILY HISTORY: Family History  Problem Relation Age of Onset  . Heart disease Mother        with pacemaker   . Asthma Father   . Heart attack Maternal Grandfather 64  . Diabetes Paternal Grandmother   . Aortic stenosis Maternal Uncle   . Prostate cancer Maternal Uncle        dx unspecified age  . Prostate cancer Maternal Uncle        dx older than 50y  . Prostate cancer Maternal Uncle        dx older than 50y  . Prostate cancer Maternal Uncle 72       s/p prostatectomy  . Breast cancer Cousin 51       maternal 1st cousin; s/p lumpectomy and radiation  . Breast cancer Cousin        maternal 1st cousin dx mid-late 20s; s/p mastectomy    Review of Systems  Constitutional: Positive for fatigue. Negative for appetite change, chills and unexpected weight change.  HENT:   Negative for hearing loss, lump/mass and trouble swallowing.   Eyes: Negative for eye problems and icterus.  Respiratory: Negative for chest tightness, cough and shortness of breath.   Cardiovascular: Negative for chest pain, leg swelling and  palpitations.  Gastrointestinal: Negative for abdominal distention, abdominal pain, constipation, diarrhea, nausea and vomiting.  Endocrine: Negative for hot flashes.  Genitourinary: Negative for difficulty urinating.   Musculoskeletal: Negative for arthralgias.  Skin: Negative for itching and rash.  Neurological: Negative for dizziness, extremity weakness, headaches and numbness.  Hematological: Negative for adenopathy. Does not bruise/bleed easily.  Psychiatric/Behavioral: Negative for depression. The patient is not nervous/anxious.  PHYSICAL EXAMINATION  ECOG PERFORMANCE STATUS: 1 - Symptomatic but completely ambulatory  Vitals:   07/01/18 1013  BP: 130/70  Pulse: 74  Resp: 17  Temp: 99 F (37.2 C)  SpO2: 98%    Physical Exam  Constitutional: She is oriented to person, place, and time. She appears well-developed and well-nourished.  HENT:  Head: Normocephalic and atraumatic.  Mouth/Throat: Oropharynx is clear and moist. No oropharyngeal exudate.  Eyes: Pupils are equal, round, and reactive to light. No scleral icterus.  Neck: Neck supple.  Cardiovascular: Normal rate, regular rhythm, normal heart sounds and intact distal pulses.  Pulmonary/Chest: Effort normal and breath sounds normal.  Abdominal: Soft. Bowel sounds are normal. She exhibits no distension and no mass. There is no tenderness. There is no rebound and no guarding.  Musculoskeletal: She exhibits no edema.  Lymphadenopathy:    She has no cervical adenopathy.  Neurological: She is alert and oriented to person, place, and time.  Skin: Skin is warm and dry. Capillary refill takes less than 2 seconds. No rash noted.  Psychiatric: She has a normal mood and affect.    LABORATORY DATA:  CBC    Component Value Date/Time   WBC 2.5 (L) 07/01/2018 0922   WBC 2.0 (L) 06/16/2018 1609   RBC 3.47 (L) 07/01/2018 0922   HGB 12.0 07/01/2018 0922   HGB 12.0 06/23/2017 0930   HCT 36.4 07/01/2018 0922   HCT 35.0  06/23/2017 0930   PLT 214 07/01/2018 0922   PLT 182 06/23/2017 0930   MCV 104.9 (H) 07/01/2018 0922   MCV 103.3 (H) 06/23/2017 0930   MCH 34.6 (H) 07/01/2018 0922   MCHC 33.0 07/01/2018 0922   RDW 15.1 07/01/2018 0922   RDW 15.1 (H) 06/23/2017 0930   LYMPHSABS 0.5 (L) 07/01/2018 0922   LYMPHSABS 0.4 (L) 06/23/2017 0930   MONOABS 0.3 07/01/2018 0922   MONOABS 0.3 06/23/2017 0930   EOSABS 0.1 07/01/2018 0922   EOSABS 0.1 06/23/2017 0930   BASOSABS 0.1 07/01/2018 0922   BASOSABS 0.1 06/23/2017 0930    CMP     Component Value Date/Time   NA 141 07/01/2018 0922   NA 141 04/20/2018 1222   NA 139 06/23/2017 0930   K 4.0 07/01/2018 0922   K 3.9 06/23/2017 0930   CL 108 07/01/2018 0922   CL 108 (H) 01/13/2013 0806   CO2 22 07/01/2018 0922   CO2 21 (L) 06/23/2017 0930   GLUCOSE 85 07/01/2018 0922   GLUCOSE 82 06/23/2017 0930   GLUCOSE 101 (H) 01/13/2013 0806   BUN 19 07/01/2018 0922   BUN 18 04/20/2018 1222   BUN 22.2 06/23/2017 0930   CREATININE 1.12 (H) 07/01/2018 0922   CREATININE 1.0 06/23/2017 0930   CALCIUM 8.8 (L) 07/01/2018 0922   CALCIUM 9.1 06/23/2017 0930   PROT 7.1 07/01/2018 0922   PROT 7.1 06/23/2017 0930   ALBUMIN 3.5 07/01/2018 0922   ALBUMIN 3.9 06/23/2017 0930   AST 42 (H) 07/01/2018 0922   AST 22 06/23/2017 0930   ALT 19 07/01/2018 0922   ALT 13 06/23/2017 0930   ALKPHOS 117 07/01/2018 0922   ALKPHOS 74 06/23/2017 0930   BILITOT 0.8 07/01/2018 0922   BILITOT 0.69 06/23/2017 0930   GFRNONAA 48 (L) 07/01/2018 0922   GFRAA 56 (L) 07/01/2018 0922       PENDING LABS:   RADIOGRAPHIC STUDIES:  Ct Chest W Contrast  Result Date: 06/29/2018 CLINICAL DATA:  Metastatic breast cancer. EXAM: CT CHEST, ABDOMEN, AND  PELVIS WITH CONTRAST TECHNIQUE: Multidetector CT imaging of the chest, abdomen and pelvis was performed following the standard protocol during bolus administration of intravenous contrast. CONTRAST:  61m OMNIPAQUE IOHEXOL 300 MG/ML  SOLN  COMPARISON:  01/01/2018 FINDINGS: CT CHEST FINDINGS Cardiovascular: The heart size appears within normal limits. Aortic atherosclerosis. Calcification within the LAD coronary artery identified. Mediastinum/Nodes: Via Normal appearance of the esophagus. Right axillary nodal dissection. No axillary, supraclavicular or axillary adenopathy. No mediastinal or hilar adenopathy. Pleural calcifications overlying the right lung again noted. Lungs/Pleura: Right-sided pleural calcifications are again noted, similar to previous exam. Overlying masslike architectural distortion subpleural aspect of the posteromedial right lower lobe is unchanged from previous exam and favored to represent an area of rounded atelectasis. Small loculated right pleural effusion is stable to slightly increased in size compared with previous exam. Small left pleural effusion is new from previous exam. Interval development of multifocal areas of ground-glass attenuation in both lungs, right greater than left. Lower lobe predominant interlobular septal thickening is also identified. There is mild nodularity along the oblique fissure of the left lung. Irregular thickening along the minor and major fissures of the right lung identified which appears similar to the previous study. Musculoskeletal: There has been interval development of progressive sclerosis involving the right scapula where a previously noted lytic bone lesion was noted, image 9/6. Hardware from previous posterior fixation of T6 through L1 again noted. Stable T9 and T10 compression fractures. Stable mixed lytic and sclerotic bone lesions involving T9 through T11 appear more appears similar to previous study. CT ABDOMEN PELVIS FINDINGS Hepatobiliary: Multifocal liver metastases are again identified. This demonstrates interval progression compared with previous exam. Large confluent lesion along the dome of liver measures 7.9 by 4.5 cm. On previous MR from 01/07/2018 this measured 3.1 x 3.1  cm. Lesion within segment 8 measures 4.0 by 2.9 cm, image 49/2. Previously 1.7 x 1.3 cm. Posterior aspect of segment 7 liver metastasis measures 3.0 cm by 1.9 cm, image 54/2. Previously 2.2 by 1.6 cm. Gallbladder normal. No biliary ductal dilatation. Pancreas: Unremarkable. No pancreatic ductal dilatation or surrounding inflammatory changes. Spleen: Normal in size without focal abnormality. Adrenals/Urinary Tract: Normal appearance of the adrenal glands. Simple appearing parapelvic cyst within the inferior pole of left kidney is identified. Urinary bladder unremarkable. Stomach/Bowel: Stomach appears unremarkable. The small bowel loops have a normal course and caliber without obstruction. The appendix is visualized and appears normal. Normal appearance of the colon. Vascular/Lymphatic: Mild aortic atherosclerosis. No aneurysm. No enlarged abdominal or pelvic adenopathy. Reproductive: Status post hysterectomy. No adnexal masses. Other: Small fat containing left inguinal hernia. No abdominopelvic ascites. Musculoskeletal: Mixed lytic and sclerotic bone lesions are identified within the sacrum. Similar to previous exam. Bilateral iliac bone sclerotic lesions are unchanged. IMPRESSION: 1. There has been interval progression of multifocal liver metastases compared with MRI from 01/07/2018. 2. Similar appearance of mixed lytic and sclerotic bone metastases. 3. New left pleural effusion. There is interstitial thickening and multifocal areas of ground-glass attenuation in both lungs. Correlate for any clinical signs or symptoms of congestive heart failure. Electronically Signed   By: TKerby MoorsM.D.   On: 06/29/2018 14:50   Ct Abdomen Pelvis W Contrast  Result Date: 06/29/2018 CLINICAL DATA:  Metastatic breast cancer. EXAM: CT CHEST, ABDOMEN, AND PELVIS WITH CONTRAST TECHNIQUE: Multidetector CT imaging of the chest, abdomen and pelvis was performed following the standard protocol during bolus administration of  intravenous contrast. CONTRAST:  791mOMNIPAQUE IOHEXOL 300 MG/ML  SOLN COMPARISON:  01/01/2018 FINDINGS: CT CHEST FINDINGS Cardiovascular: The heart size appears within normal limits. Aortic atherosclerosis. Calcification within the LAD coronary artery identified. Mediastinum/Nodes: Via Normal appearance of the esophagus. Right axillary nodal dissection. No axillary, supraclavicular or axillary adenopathy. No mediastinal or hilar adenopathy. Pleural calcifications overlying the right lung again noted. Lungs/Pleura: Right-sided pleural calcifications are again noted, similar to previous exam. Overlying masslike architectural distortion subpleural aspect of the posteromedial right lower lobe is unchanged from previous exam and favored to represent an area of rounded atelectasis. Small loculated right pleural effusion is stable to slightly increased in size compared with previous exam. Small left pleural effusion is new from previous exam. Interval development of multifocal areas of ground-glass attenuation in both lungs, right greater than left. Lower lobe predominant interlobular septal thickening is also identified. There is mild nodularity along the oblique fissure of the left lung. Irregular thickening along the minor and major fissures of the right lung identified which appears similar to the previous study. Musculoskeletal: There has been interval development of progressive sclerosis involving the right scapula where a previously noted lytic bone lesion was noted, image 9/6. Hardware from previous posterior fixation of T6 through L1 again noted. Stable T9 and T10 compression fractures. Stable mixed lytic and sclerotic bone lesions involving T9 through T11 appear more appears similar to previous study. CT ABDOMEN PELVIS FINDINGS Hepatobiliary: Multifocal liver metastases are again identified. This demonstrates interval progression compared with previous exam. Large confluent lesion along the dome of liver  measures 7.9 by 4.5 cm. On previous MR from 01/07/2018 this measured 3.1 x 3.1 cm. Lesion within segment 8 measures 4.0 by 2.9 cm, image 49/2. Previously 1.7 x 1.3 cm. Posterior aspect of segment 7 liver metastasis measures 3.0 cm by 1.9 cm, image 54/2. Previously 2.2 by 1.6 cm. Gallbladder normal. No biliary ductal dilatation. Pancreas: Unremarkable. No pancreatic ductal dilatation or surrounding inflammatory changes. Spleen: Normal in size without focal abnormality. Adrenals/Urinary Tract: Normal appearance of the adrenal glands. Simple appearing parapelvic cyst within the inferior pole of left kidney is identified. Urinary bladder unremarkable. Stomach/Bowel: Stomach appears unremarkable. The small bowel loops have a normal course and caliber without obstruction. The appendix is visualized and appears normal. Normal appearance of the colon. Vascular/Lymphatic: Mild aortic atherosclerosis. No aneurysm. No enlarged abdominal or pelvic adenopathy. Reproductive: Status post hysterectomy. No adnexal masses. Other: Small fat containing left inguinal hernia. No abdominopelvic ascites. Musculoskeletal: Mixed lytic and sclerotic bone lesions are identified within the sacrum. Similar to previous exam. Bilateral iliac bone sclerotic lesions are unchanged. IMPRESSION: 1. There has been interval progression of multifocal liver metastases compared with MRI from 01/07/2018. 2. Similar appearance of mixed lytic and sclerotic bone metastases. 3. New left pleural effusion. There is interstitial thickening and multifocal areas of ground-glass attenuation in both lungs. Correlate for any clinical signs or symptoms of congestive heart failure. Electronically Signed   By: Kerby Moors M.D.   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 Sallyanne Kuster) CKD stage III Pathologic compression fracture T10 status post radiation and laminectomy T6-L1 09/01/2015,  radiation to right iliac area 05/2016  Rebiopsy of the lung nodule 09/19/2016: Metastatic breast cancer that is HER-2 negative, -------------------------------------------------------------------------------------------------------------------------------------------- Current treatment: LetrozolewithPalbociclib started 10/01/2016 switched to Faslodex with Ibrance 02/03/2018  CT chest abdomen and pelvis on 06/29/18 show liver, lung, bone progression.  We will send foundation 1 and caris molecular testing off of her 12/2017 liver biopsy.  I reviewed that there are potentially a few other options, including oral medications, and chemotherapy.  She understands this and knows we have to wait for this additional testing before we can discuss further.    Since Yelina is tolerating her current treatment well, she will finish out her current pack of Ibrance, her anc is 1.5 today.  She will also receive Xgeva and Fulvestrant today.  We reviewed that she should take extra calcium on the days that she is getting Xgeva.  She is planning on taking extra TUMS today.    We reviewed Shaina's HCPOA, and she is in the process of updating her advanced directives.    Rainelle will return in 4 weeks for labs and f/u with Dr. Lindi Adie.    This above plan was reviewed with Dr. Jana Hakim in Dr. Geralyn Flash absence as well as Dr. Lindi Adie over the phone.     All questions were answered. The patient knows to call the clinic with any problems, questions or concerns. We can certainly see the patient much  sooner if necessary.  A total of (30) minutes of face-to-face time was spent with this patient with greater than 50% of that time in counseling and care-coordination.  This note was electronically signed. Scot Dock, NP 07/01/2018

## 2018-07-01 NOTE — Telephone Encounter (Signed)
Gave avs and calendar ° °

## 2018-07-01 NOTE — Patient Instructions (Signed)

## 2018-07-06 ENCOUNTER — Encounter (HOSPITAL_COMMUNITY): Payer: Self-pay | Admitting: Nurse Practitioner

## 2018-07-06 ENCOUNTER — Ambulatory Visit (HOSPITAL_COMMUNITY)
Admission: RE | Admit: 2018-07-06 | Discharge: 2018-07-06 | Disposition: A | Discharge status: Home / Self Care | Payer: Medicare Other | Source: Ambulatory Visit | Attending: Nurse Practitioner | Admitting: Nurse Practitioner

## 2018-07-06 VITALS — BP 140/76 | HR 72 | Ht 62.0 in | Wt 172.0 lb

## 2018-07-06 DIAGNOSIS — I2510 Atherosclerotic heart disease of native coronary artery without angina pectoris: Secondary | ICD-10-CM | POA: Insufficient documentation

## 2018-07-06 DIAGNOSIS — Z888 Allergy status to other drugs, medicaments and biological substances status: Secondary | ICD-10-CM | POA: Diagnosis not present

## 2018-07-06 DIAGNOSIS — I5032 Chronic diastolic (congestive) heart failure: Secondary | ICD-10-CM | POA: Insufficient documentation

## 2018-07-06 DIAGNOSIS — Z853 Personal history of malignant neoplasm of breast: Secondary | ICD-10-CM | POA: Diagnosis not present

## 2018-07-06 DIAGNOSIS — Z79899 Other long term (current) drug therapy: Secondary | ICD-10-CM | POA: Diagnosis not present

## 2018-07-06 DIAGNOSIS — I130 Hypertensive heart and chronic kidney disease with heart failure and stage 1 through stage 4 chronic kidney disease, or unspecified chronic kidney disease: Secondary | ICD-10-CM | POA: Insufficient documentation

## 2018-07-06 DIAGNOSIS — Z885 Allergy status to narcotic agent status: Secondary | ICD-10-CM | POA: Diagnosis not present

## 2018-07-06 DIAGNOSIS — N183 Chronic kidney disease, stage 3 (moderate): Secondary | ICD-10-CM | POA: Insufficient documentation

## 2018-07-06 DIAGNOSIS — Z87891 Personal history of nicotine dependence: Secondary | ICD-10-CM | POA: Diagnosis not present

## 2018-07-06 DIAGNOSIS — Z7901 Long term (current) use of anticoagulants: Secondary | ICD-10-CM | POA: Insufficient documentation

## 2018-07-06 DIAGNOSIS — Z981 Arthrodesis status: Secondary | ICD-10-CM | POA: Insufficient documentation

## 2018-07-06 DIAGNOSIS — Z90710 Acquired absence of both cervix and uterus: Secondary | ICD-10-CM | POA: Insufficient documentation

## 2018-07-06 DIAGNOSIS — Z923 Personal history of irradiation: Secondary | ICD-10-CM | POA: Diagnosis not present

## 2018-07-06 DIAGNOSIS — I4819 Other persistent atrial fibrillation: Secondary | ICD-10-CM | POA: Diagnosis not present

## 2018-07-06 DIAGNOSIS — I10 Essential (primary) hypertension: Secondary | ICD-10-CM

## 2018-07-06 MED ORDER — AMIODARONE HCL 200 MG PO TABS: 200.0000 mg | ORAL_TABLET | Freq: Every day | ORAL | 1 refills | Status: AC

## 2018-07-06 NOTE — Progress Notes (Signed)
Primary Care Physician: [name], MD Primary Cardiologist: [place]  Lisa [name] is a 77 y.o. female with a history of persistent atrial fibrillation who presents for follow up in the [facility]. She is post cardioversion 06/26/18.  Since cardioversion, the patient reports doing relatively well.  Today, she denies symptoms of palpitations, chest pain, shortness of breath (above baseline), orthopnea, PND, lower extremity edema, dizziness, presyncope, syncope, snoring, daytime somnolence, bleeding, or neurologic sequela. The patient is tolerating medications without difficulties and is otherwise without complaint today. She continues with back pain and balance issues.   Past Medical History:  Diagnosis Date  . Breast cancer ([place]) 07-13-1997   right  . Chronic back pain   . CKD (chronic kidney disease), stage III ([place])   . Complication of anesthesia   . Coronary artery calcification of native artery    a. seen on PET scan 04/2016.  [name] Hypertension   . Pericardial effusion    a. small by TEE 05/2016.  [name] Persistent atrial fibrillation   . Pleural effusion 11-08-2008  . PONV (postoperative nausea and vomiting)   . Radiation 08/17/2015-08/29/2015   lower thoracic spine 22.5 gray  . Radiation 01/17/16-01/24/16   right femur 20Gy   Past Surgical History:  Procedure Laterality Date  . ABDOMINAL HYSTERECTOMY  02-21-1982  . BREAST BIOPSY  07-13-1997  . CARDIOVERSION N/A 05/27/2016   Procedure: CARDIOVERSION;  Surgeon: [name], MD;  Location: [facility] ENDOSCOPY;  Service: Cardiovascular;  Laterality: N/A;  . CARDIOVERSION N/A 05/22/2018   Procedure: CARDIOVERSION;  Surgeon: Josue Hector, MD;  Location: [facility] ENDOSCOPY;  Service: Cardiovascular;  Laterality: N/A;  . CARDIOVERSION N/A 06/26/2018   Procedure: CARDIOVERSION;  Surgeon: Dorothy Spark, MD;  Location: [place] [facility];  Service: Cardiovascular;  Laterality: N/A;  . DILATION AND CURETTAGE, DIAGNOSTIC  / THERAPEUTIC  12-13-1981  . MASTECTOMY  07-29-1997  . PELVIC LAPAROSCOPY  11-06-1981  . POSTERIOR LUMBAR FUSION 4 LEVEL N/A 09/01/2015   Procedure: Thoracic six-Lumbar one fusion with arthrodesis pedicle screw stabilization and decompression;  Surgeon: Ashok Pall, MD;  Location: [place] NEURO ORS;  Service: Neurosurgery;  Laterality: N/A;  T6-L1 fusion with arthrodesis pedicle screw stabilization and decompression  . TEE WITHOUT CARDIOVERSION N/A 05/27/2016   Procedure: TRANSESOPHAGEAL ECHOCARDIOGRAM (TEE);  Surgeon: [name], MD;  Location: [facility] ENDOSCOPY;  Service: Cardiovascular;  Laterality: N/A;  . TEE WITHOUT CARDIOVERSION N/A 06/26/2018   Procedure: TRANSESOPHAGEAL ECHOCARDIOGRAM (TEE);  Surgeon: Dorothy Spark, MD;  Location: [facility] ENDOSCOPY;  Service: Cardiovascular;  Laterality: N/A;  . THORACENTESIS  10-18-2008    Current Outpatient Medications  Medication Sig Dispense Refill  . acetaminophen (TYLENOL) 500 MG tablet Take 500 mg by mouth every 6 (six) hours as needed for moderate pain.    [name] CARTIA XT 120 MG 24 hr capsule TAKE 1 CAPSULE BY MOUTH ONCE DAILY (Patient taking differently: Take 120 mg by mouth daily. ) 30 capsule 6  . denosumab (PROLIA) 60 MG/ML SOSY injection Inject 60 mg into the skin every 3 (three) months.    [name] ELIQUIS 5 MG TABS tablet TAKE 1 TABLET BY MOUTH TWICE DAILY (Patient taking differently: Take 5 mg by mouth 2 (two) times daily. ) 180 tablet 1  . fulvestrant (FASLODEX) 250 MG/5ML injection Inject 250 mg into the muscle every 30 (thirty) days. One injection each buttock over 1-2 minutes. Warm prior to use.     . furosemide (LASIX) 40 MG tablet TAKE 1 TABLET (40 MG TOTAL) BY  MOUTH DAILY. 90 tablet 2  . IBRANCE 100 MG capsule TAKE 1 CAPSULE BY MOUTH ONCE DAILY WITH BREAKFAST. TAKE WHOLE WITH FOOD. TAKE FOR 3 WEEKS ON; 1 WEEK OFF--ALTERNATING. (Patient taking differently: Take 100 mg by mouth See admin instructions. Take 1 capsule (100 mg( daily with breakfast cyclically  for 3 weeks on & 1 week off) 21 capsule 2  . loratadine (CLARITIN) 10 MG tablet Take 10 mg by mouth daily.     [name] losartan (COZAAR) 50 MG tablet Take 100 mg daily by mouth.  2  . Multiple Vitamins-Minerals (PRESERVISION AREDS 2+MULTI VIT PO) Take 1 tablet by mouth 2 (two) times daily.     . ondansetron (ZOFRAN) 4 MG tablet Take 4 mg by mouth every 6 (six) hours as needed for nausea or vomiting.    Vladimir Faster Glycol-Propyl Glycol (LUBRICANT EYE DROPS) 0.4-0.3 % SOLN Place 1 drop into both eyes 3 (three) times daily as needed (for dry/irritated eyes.).    [name] Potassium 99 MG TABS Take 198 mg by mouth 2 (two) times daily.     . pravastatin (PRAVACHOL) 40 MG tablet Take 1 tablet (40 mg total) by mouth every evening. 90 tablet 3  . vitamin B-12 (CYANOCOBALAMIN) 1000 MCG tablet Take 1,000 mcg by mouth 2 (two) times daily.    . vitamin C (ASCORBIC ACID) 500 MG tablet Take 500 mg by mouth daily.      [name] amiodarone (PACERONE) 200 MG tablet Take 1 tablet (200 mg total) by mouth daily. 90 tablet 1   No current facility-administered medications for this encounter.     Allergies  Allergen Reactions  . Diphenhydramine Hcl Other (See Comments)    Severe headaches  . Codeine Sulfate Rash    REACTION: rash. Makes her jittery  . Metoprolol Other (See Comments)    Face Kept turning red  . Oseltamivir Phosphate Nausea And Vomiting and Rash  . Oxycodone-Acetaminophen Nausea And Vomiting    REACTION: emesis  . Prednisone Other (See Comments)    REACTION: hiatal hernia, runny  Nose, felt terrible  . Sudafed [Pseudoephedrine Hcl] Other (See Comments)    vertigo    Social History   Socioeconomic History  . Marital status: Single    Spouse name: Not on file  . Number of children: 0  . Years of education: Not on file  . Highest education level: Not on file  Occupational History  . Occupation: Firefighter for [employer]  . Financial resource strain: Not on file  . Food  insecurity:    Worry: Not on file    Inability: Not on file  . Transportation needs:    Medical: Not on file    Non-medical: Not on file  Tobacco Use  . Smoking status: Former Smoker    Packs/day: 0.50    Years: 30.00    Pack years: 15.00    Types: Cigarettes    Last attempt to quit: 07/22/1996    Years since quitting: 21.9  . Smokeless tobacco: Never Used  . Tobacco comment: 1/2 up to 1 ppd  Substance and Sexual Activity  . Alcohol use: No    Alcohol/week: 0.0 standard drinks    Comment: hx of alcohol socially - hasn't had drink since March 1998  . Drug use: No  . Sexual activity: Not on file  Lifestyle  . Physical activity:    Days per week: Not on file    Minutes per session: Not on file  .  Stress: Not on file  Relationships  . Social connections:    Talks on phone: Not on file    Gets together: Not on file    Attends religious service: Not on file    Active member of club or organization: Not on file    Attends meetings of clubs or organizations: Not on file    Relationship status: Not on file  . Intimate partner violence:    Fear of current or ex partner: Not on file    Emotionally abused: Not on file    Physically abused: Not on file    Forced sexual activity: Not on file  Other Topics Concern  . Not on file  Social History Narrative  . Not on file    Family History  Problem Relation Age of Onset  . Heart disease Mother        with pacemaker   . Asthma Father   . Heart attack Maternal Grandfather 64  . Diabetes Paternal Grandmother   . Aortic stenosis Maternal Uncle   . Prostate cancer Maternal Uncle        dx unspecified age  . Prostate cancer Maternal Uncle        dx older than 50y  . Prostate cancer Maternal Uncle        dx older than 50y  . Prostate cancer Maternal Uncle 72       s/p prostatectomy  . Breast cancer Cousin 39       maternal 1st cousin; s/p lumpectomy and radiation  . Breast cancer Cousin        maternal 1st cousin dx mid-late  28s; s/p mastectomy    ROS- All systems are reviewed and negative except as per the HPI above.  Physical Exam: Vitals:   07/06/18 1409  BP: 140/76  Pulse: 72  Weight: 78 kg  Height: 5\' 2"  (1.575 m)    GEN- The patient is elderly appearing, alert and oriented x 3 today.   Head- normocephalic, atraumatic Eyes-  Sclera clear, conjunctiva pink Ears- hearing intact Oropharynx- clear Neck- supple  Lungs- Clear to ausculation bilaterally, normal work of breathing Heart- Regular rate and rhythm GI- soft, NT, ND, + BS Extremities- no clubbing, cyanosis, or edema MS- no significant deformity or atrophy Skin- no rash or lesion Psych- euthymic mood, full affect Neuro- strength and sensation are intact  Wt Readings from Last 3 Encounters:  07/06/18 78 kg  07/01/18 79.4 kg  06/16/18 79.4 kg    EKG today demonstrates sinus rhythm, rate 72  Epic records are reviewed at length today  Assessment and Plan:  1. Persistent atrial fibrillation Maintaining SR post cardioversion Decrease amiodarone to 200mg  daily Continue Eliquis for CHADS2VASC of 4  2. Chronic diastolic heart failure Stable No change required today  3.  HTN BP stable at home No changes today  Follow up with Dr Loletha Grayer as scheduled  Chanetta Marshall, NP 07/06/2018 2:27 PM

## 2018-07-06 NOTE — Progress Notes (Signed)
Thank you MCr 

## 2018-07-10 MED FILL — IBRANCE 100 MG CAPSULE: 100 | 28 days supply | Qty: 21 | Fill #2

## 2018-07-10 MED FILL — AMIODARONE HCL 200 MG TAB: 200 | 90 days supply | Qty: 90 | Fill #0

## 2018-07-16 ENCOUNTER — Encounter (HOSPITAL_COMMUNITY): Payer: Self-pay | Admitting: Hematology and Oncology

## 2018-07-24 ENCOUNTER — Encounter: Payer: Self-pay | Admitting: Cardiovascular Disease

## 2018-07-24 ENCOUNTER — Ambulatory Visit: Payer: Medicare Other | Admitting: Cardiovascular Disease

## 2018-07-24 VITALS — BP 136/62 | HR 79 | Ht 62.0 in | Wt 178.0 lb

## 2018-07-24 DIAGNOSIS — I3139 Other pericardial effusion (noninflammatory): Secondary | ICD-10-CM

## 2018-07-24 DIAGNOSIS — Z7901 Long term (current) use of anticoagulants: Secondary | ICD-10-CM | POA: Diagnosis not present

## 2018-07-24 DIAGNOSIS — Z79899 Other long term (current) drug therapy: Secondary | ICD-10-CM | POA: Diagnosis not present

## 2018-07-24 DIAGNOSIS — I5032 Chronic diastolic (congestive) heart failure: Secondary | ICD-10-CM

## 2018-07-24 DIAGNOSIS — C782 Secondary malignant neoplasm of pleura: Secondary | ICD-10-CM

## 2018-07-24 DIAGNOSIS — I313 Pericardial effusion (noninflammatory): Secondary | ICD-10-CM

## 2018-07-24 DIAGNOSIS — I2510 Atherosclerotic heart disease of native coronary artery without angina pectoris: Secondary | ICD-10-CM

## 2018-07-24 DIAGNOSIS — I10 Essential (primary) hypertension: Secondary | ICD-10-CM

## 2018-07-24 DIAGNOSIS — I480 Paroxysmal atrial fibrillation: Secondary | ICD-10-CM | POA: Diagnosis not present

## 2018-07-24 DIAGNOSIS — C50911 Malignant neoplasm of unspecified site of right female breast: Secondary | ICD-10-CM

## 2018-07-24 DIAGNOSIS — E7800 Pure hypercholesterolemia, unspecified: Secondary | ICD-10-CM

## 2018-07-24 NOTE — Patient Instructions (Signed)
Medication Instructions:  Dr Sallyanne Kuster recommends that you continue on your current medications as directed. Please refer to the Current Medication list given to you today.  If you need a refill on your cardiac medications before your next appointment, please call your pharmacy.   Lab work: Your physician recommends that you return for lab work at your convenience.  If you have labs (blood work) drawn today and your tests are completely normal, you will receive your results only by: [name] MyChart Message (if you have MyChart) OR . A paper copy in the mail If you have any lab test that is abnormal or we need to change your treatment, we will call you to review the results.  Follow-Up: At [facility], you and your health needs are our priority.  As part of our continuing mission to provide you with exceptional heart care, we have created designated Provider Care Teams.  These Care Teams include your primary Cardiologist (physician) and Advanced Practice Providers (APPs -  Physician Assistants and Nurse Practitioners) who all work together to provide you with the care you need, when you need it. You will need a follow up appointment in 6 months.  You may see [name], MD or one of the following Advanced Practice Providers on your designated Care Team: [place], [place] . Fabian Sharp, PA-C . You will receive a reminder letter in the mail two months in advance. If you don't receive a letter, please call our office to schedule the follow-up appointment.

## 2018-07-24 NOTE — Progress Notes (Signed)
Dictation #1 [id number]  [id number]    Cardiology Office Note    Date:  07/25/2018   ID:  Lisa [name], DOB Sep 18, 1940, MRN [id number]  PCP:  [name], MD  Cardiologist:   [name], MD   Chief Complaint  Patient presents with  . Follow-up    3 months.    History of Present Illness:  Lisa [name] is a 78 y.o. female with paroxysmal atrial fibrillation that led to decompensated heart failure in late 2017, cardioversion 05/27/2016, with recurrence and a successful cardioversion in ED 07/25/2016, again June 26, 2018.  She remains in sinus rhythm taking amiodarone 200 mg once daily.  She has not had any palpitations or detected any tachycardia since the cardioversion.  She denies any problems with exertional dyspnea, orthopnea, leg edema, anginal chest pain, palpitations or syncope.  He has occasional brief shaking in her right upper quadrant.  CT performed on December 9 shows stable bony metastasis from her breast cancer, but unfortunately shows markedly enlarged liver metastases.  She has an appointment with her oncologist on January 8.  She is compliant with Eliquis anticoagulation and has not had any bleeding problems recent falls or injuries..  In the past she did not tolerate beta-blockers due to bradycardia.  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  The patient specifically denies any chest pain at rest exertion, dyspnea at rest or with exertion, orthopnea, paroxysmal nocturnal dyspnea, syncope, palpitations, focal neurological deficits, intermittent claudication, lower extremity edema, unexplained weight gain, cough, hemoptysis or wheezing.  She has bilateral ankle edema which is always a little asymmetrical, worse on the right.  She has normal left  ventricular systolic function and mild biatrial dilation and mild to moderate pulmonary artery hypertension by echo. She has evidence of coronary artery calcification, but has not undergone angiography or nuclear stress testing.  She has never had angina pectoris.  She is not receiving aspirin due to full anticoagulation with Eliquis.  She takes pravastatin for hyperlipidemia.  Past Medical History:  Diagnosis Date  . Breast cancer ([place]) 07-13-1997   right  . Chronic back pain   . CKD (chronic kidney disease), stage III ([place])   . Complication of anesthesia   . Coronary artery calcification of native artery    a. seen on PET scan 04/2016.  [name] Hypertension   . Pericardial effusion    a. small by TEE 05/2016.  [name] Persistent atrial fibrillation   . Pleural effusion 11-08-2008  . PONV (postoperative nausea and vomiting)   . Radiation 08/17/2015-08/29/2015   lower thoracic spine 22.5 gray  . Radiation 01/17/16-01/24/16   right femur 20Gy    Past Surgical History:  Procedure Laterality Date  . ABDOMINAL HYSTERECTOMY  02-21-1982  . BREAST BIOPSY  07-13-1997  . CARDIOVERSION N/A 05/27/2016   Procedure: CARDIOVERSION;  Surgeon: [name], MD;  Location: [place] ENDOSCOPY;  Service: Cardiovascular;  Laterality: N/A;  . CARDIOVERSION N/A 05/22/2018   Procedure: CARDIOVERSION;  Surgeon: Josue Hector, MD;  Location[facility] ENDOSCOPY;  Service: Cardiovascular;  Laterality: N/A;  . CARDIOVERSION N/A 06/26/2018   Procedure: CARDIOVERSION;  Surgeon: Dorothy Spark, MD;  Location: [place];  Service: Cardiovascular;  Laterality: N/A;  . DILATION AND CURETTAGE, DIAGNOSTIC / THERAPEUTIC  12-13-1981  . MASTECTOMY  07-29-1997  . PELVIC LAPAROSCOPY  11-06-1981  . POSTERIOR LUMBAR FUSION  4 LEVEL N/A 09/01/2015   Procedure: Thoracic six-Lumbar one fusion with arthrodesis pedicle screw stabilization and decompression;  Surgeon: Ashok Pall, MD;  Location: [place] NEURO ORS;  Service: Neurosurgery;  Laterality: N/A;  T6-L1  fusion with arthrodesis pedicle screw stabilization and decompression  . TEE WITHOUT CARDIOVERSION N/A 05/27/2016   Procedure: TRANSESOPHAGEAL ECHOCARDIOGRAM (TEE);  Surgeon: [name], MD;  Location: [facility] ENDOSCOPY;  Service: Cardiovascular;  Laterality: N/A;  . TEE WITHOUT CARDIOVERSION N/A 06/26/2018   Procedure: TRANSESOPHAGEAL ECHOCARDIOGRAM (TEE);  Surgeon: Dorothy Spark, MD;  Location: [facility] ENDOSCOPY;  Service: Cardiovascular;  Laterality: N/A;  . THORACENTESIS  10-18-2008    Current Medications: Outpatient Medications Prior to Visit  Medication Sig Dispense Refill  . acetaminophen (TYLENOL) 500 MG tablet Take 500 mg by mouth every 6 (six) hours as needed for moderate pain.    [name] amiodarone (PACERONE) 200 MG tablet Take 1 tablet (200 mg total) by mouth daily. 90 tablet 1  . CARTIA XT 120 MG 24 hr capsule TAKE 1 CAPSULE BY MOUTH ONCE DAILY (Patient taking differently: Take 120 mg by mouth daily. ) 30 capsule 6  . denosumab (PROLIA) 60 MG/ML SOSY injection Inject 60 mg into the skin every 3 (three) months.    [name] ELIQUIS 5 MG TABS tablet TAKE 1 TABLET BY MOUTH TWICE DAILY (Patient taking differently: Take 5 mg by mouth 2 (two) times daily. ) 180 tablet 1  . fulvestrant (FASLODEX) 250 MG/5ML injection Inject 250 mg into the muscle every 30 (thirty) days. One injection each buttock over 1-2 minutes. Warm prior to use.     . furosemide (LASIX) 40 MG tablet TAKE 1 TABLET (40 MG TOTAL) BY MOUTH DAILY. 90 tablet 2  . IBRANCE 100 MG capsule TAKE 1 CAPSULE BY MOUTH ONCE DAILY WITH BREAKFAST. TAKE WHOLE WITH FOOD. TAKE FOR 3 WEEKS ON; 1 WEEK OFF--ALTERNATING. (Patient taking differently: Take 100 mg by mouth See admin instructions. Take 1 capsule (100 mg( daily with breakfast cyclically for 3 weeks on & 1 week off) 21 capsule 2  . loratadine (CLARITIN) 10 MG tablet Take 10 mg by mouth daily.     [name] losartan (COZAAR) 50 MG tablet Take 100 mg daily by mouth.  2  . MECLIZINE HCL PO Take 1 tablet by  mouth as needed.    . Multiple Vitamins-Minerals (PRESERVISION AREDS 2+MULTI VIT PO) Take 1 tablet by mouth 2 (two) times daily.     . ondansetron (ZOFRAN) 4 MG tablet Take 4 mg by mouth every 6 (six) hours as needed for nausea or vomiting.    Vladimir Faster Glycol-Propyl Glycol (LUBRICANT EYE DROPS) 0.4-0.3 % SOLN Place 1 drop into both eyes 3 (three) times daily as needed (for dry/irritated eyes.).    [name] Potassium 99 MG TABS Take 198 mg by mouth 2 (two) times daily.     . pravastatin (PRAVACHOL) 40 MG tablet Take 1 tablet (40 mg total) by mouth every evening. 90 tablet 3  . vitamin B-12 (CYANOCOBALAMIN) 1000 MCG tablet Take 1,000 mcg by mouth 2 (two) times daily.    . vitamin C (ASCORBIC ACID) 500 MG tablet Take 500 mg by mouth daily.       No facility-administered medications prior to visit.      Allergies:   Diphenhydramine hcl; Codeine sulfate; Metoprolol; Oseltamivir phosphate; Oxycodone-acetaminophen; Prednisone; and Sudafed [pseudoephedrine hcl]   Social History   Socioeconomic History  . Marital status: Single    Spouse name: Not on file  . Number of  children: 0  . Years of education: Not on file  . Highest education level: Not on file  Occupational History  . Occupation: Firefighter for [employer]  . Financial resource strain: Not on file  . Food insecurity:    Worry: Not on file    Inability: Not on file  . Transportation needs:    Medical: Not on file    Non-medical: Not on file  Tobacco Use  . Smoking status: Former Smoker    Packs/day: 0.50    Years: 30.00    Pack years: 15.00    Types: Cigarettes    Last attempt to quit: 07/22/1996    Years since quitting: 22.0  . Smokeless tobacco: Never Used  . Tobacco comment: 1/2 up to 1 ppd  Substance and Sexual Activity  . Alcohol use: No    Alcohol/week: 0.0 standard drinks    Comment: hx of alcohol socially - hasn't had drink since March 1998  . Drug use: No  . Sexual activity: Not on  file  Lifestyle  . Physical activity:    Days per week: Not on file    Minutes per session: Not on file  . Stress: Not on file  Relationships  . Social connections:    Talks on phone: Not on file    Gets together: Not on file    Attends religious service: Not on file    Active member of club or organization: Not on file    Attends meetings of clubs or organizations: Not on file    Relationship status: Not on file  Other Topics Concern  . Not on file  Social History Narrative  . Not on file     Family History:  The patient's family history includes Aortic stenosis in her maternal uncle; Asthma in her father; Breast cancer in her cousin; Breast cancer (age of onset: 60) in her cousin; Diabetes in her paternal grandmother; Heart attack (age of onset: 62) in her maternal grandfather; Heart disease in her mother; Prostate cancer in her maternal uncle, maternal uncle, and maternal uncle; Prostate cancer (age of onset: 67) in her maternal uncle.   ROS:   Please see the history of present illness.    ROS all other systems are reviewed and are negative   PHYSICAL EXAM:   VS:  BP 136/62 (BP Location: Left Arm, Patient Position: Sitting, Cuff Size: Normal)   Pulse 79   Ht 5\' 2"  (1.575 m)   Wt 178 lb (80.7 kg)   BMI 32.56 kg/m      General: Alert, oriented x3, no distress, looks worried.  Mildly obese Head: no evidence of trauma, PERRL, EOMI, no exophtalmos or lid lag, no myxedema, no xanthelasma; normal ears, nose and oropharynx Neck: normal jugular venous pulsations and no hepatojugular reflux; brisk carotid pulses without delay and no carotid bruits Chest: clear to auscultation, no signs of consolidation by percussion or palpation, normal fremitus, symmetrical and full respiratory excursions Cardiovascular: normal position and quality of the apical impulse, regular rhythm, normal first and second heart sounds, no murmurs, rubs or gallops Abdomen: no tenderness or distention, no masses  by palpation, no abnormal pulsatility or arterial bruits, normal bowel sounds, no hepatosplenomegaly Extremities: no clubbing, cyanosis' has trivial ankle edema; 2+ radial, ulnar and brachial pulses bilaterally; 2+ right femoral, posterior tibial and dorsalis pedis pulses; 2+ left femoral, posterior tibial and dorsalis pedis pulses; no subclavian or femoral bruits Neurological: grossly nonfocal Psych: Normal mood and affect  Wt Readings from Last 3 Encounters:  07/24/18 178 lb (80.7 kg)  07/06/18 172 lb (78 kg)  07/01/18 175 lb (79.4 kg)      Studies/Labs Reviewed:   EKG:  EKG is not ordered today.  July 06, 2018 ECG from the A. fib clinic shows sinus rhythm left axis deviation, left ventricular hypertrophy, prolonged QTC 510 ms  Recent Labs: 04/20/2018: TSH 3.710 07/01/2018: ALT 19; BUN 19; Creatinine 1.12; Hemoglobin 12.0; Platelet Count 214; Potassium 4.0; Sodium 141   Lipid Panel    Component Value Date/Time   CHOL 134 05/23/2016 0304   CHOL 133 11/09/2015 1104   TRIG 247 (H) 05/23/2016 0304   TRIG 238 (H) 11/09/2015 1104   HDL 31 (L) 05/23/2016 0304   HDL 43 11/09/2015 1104   CHOLHDL 4.3 05/23/2016 0304   VLDL 49 (H) 05/23/2016 0304   LDLCALC 54 05/23/2016 0304   LDLCALC 42 11/09/2015 1104   LDLDIRECT 75.4 06/27/2008 0931    Additional studies/ records that were reviewed today include:  Records from recent visits in  AFib clinic  ASSESSMENT:    1. Paroxysmal atrial fibrillation (HCC)   2. On amiodarone therapy   3. Chronic diastolic heart failure ([place])   4. Long term current use of anticoagulant   5. Pericardial effusion   6. Hypercholesterolemia   7. Essential hypertension   8. Coronary artery calcification seen on CT scan   9. 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 AFib: Currently in sinus rhythm and asymptomatic.[name] CHADSVasc 4 (age 65, gender, HTN), on Eliquis.  Plan to continue  amiodarone.   2. Amiodarone: Check liver function tests and thyroid function tests every 6 months. 3. CHF: Of is been chronic trivial ankle swelling she appears euvolemic on the current dose of diuretic.  NYHA functional class I. 4. Eliquis: Tolerated without serious bleeding complications.  Continue full anticoagulation.  No history of TIA or stroke. 5. Hx of Pericardial effusion: Small pericardial effusion when she initially presented with atrial fibrillation.  Follow-up echo in May 2018 showed resolution of effusion.  This makes it very unlikely that it was neoplastic.  However, she does have documented pleural involvement with breast cancer.   6. HTN: Adequate control. 7. HLP: on statin, LDL at target.  She did have coronary calcifications on chest CT, but has never had angina pectoris or a work-up for coronary disease. 8. Metastatic breast cancer: No evidence of progression of the liver metastases, although bone disease remains stable.  Appointment with oncology next week.  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    Medication Adjustments/Labs and Tests Ordered: Current medicines are reviewed at length with the patient today.  Concerns regarding medicines are outlined above.  Medication changes, Labs and Tests ordered today are listed in the Patient Instructions below. Patient Instructions  Medication Instructions:  Dr Sallyanne Kuster recommends that you continue on your current medications as directed. Please refer to the Current Medication list given to you today.  If you need a refill on your cardiac medications before your next appointment, please call your pharmacy.   Lab work: Your physician recommends that you return for lab work at your convenience.  If you have labs (blood work) drawn today and your tests are  completely  normal, you will receive your results only by: [name] MyChart Message (if you have MyChart) OR . A paper copy in the mail If you have any lab test that is abnormal or we need to change your treatment, we will call you to review the results.  Follow-Up: At [name], you and your health needs are our priority.  As part of our continuing mission to provide you with exceptional heart care, we have created designated Provider Care Teams.  These Care Teams include your primary Cardiologist (physician) and Advanced Practice Providers (APPs -  Physician Assistants and Nurse Practitioners) who all work together to provide you with the care you need, when you need it. You will need a follow up appointment in 6 months.  You may see [name], MD or one of the following Advanced Practice Providers on your designated Care Team: [place], [place] . Fabian Sharp, PA-C . You will receive a reminder letter in the mail two months in advance. If you don't receive a letter, please call our office to schedule the follow-up appointment.    Signed, [name], MD  07/25/2018 10:21 AM    [facility] [address], [place], [place]  [address] Phone: [phone]; Fax: [phone]

## 2018-07-28 NOTE — Progress Notes (Signed)
Patient Care Team: [name], MD as PCP - General (Internal Medicine) [name], MD as PCP - Cardiology (Cardiology)  DIAGNOSIS:    ICD-10-CM   1. 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 Findings concerning for liver metastases, numerous pulmonary nodules bilaterally    05/09/2016 - 09/18/2016 Chemotherapy    Taxol weekly initially and later changed her day 1 day 8 every 3 weeks (complicated by A. fib with RVR and prolonged respiratory infection) along with Xgeva     09/19/2016 Procedure    Biopsy of lung mass: Metastatic carcinoma consistent with breast origin, HER-2 negative ratio 1.37    10/01/2016 -  Anti-estrogen oral therapy    LetrozolewithPalbociclib (Ibrance)started 10/01/2016 switched to Faslodex with Ibrance 02/03/2018    06/23/2017 Imaging    CT chest abdomen pelvis: No evidence of progression of metastatic disease, bone metastases stable  right-sided pleural metastases and pleural effusions are stable    06/29/2018 Imaging    Progression of disease in the liver, lung and bones    07/09/2018 Miscellaneous    PDL 1: 0%; AR IHC +90%, ESR 1 mutation ,negative for PI K3 CA mutation, negative for BRCA 1 and 2 mutations     CHIEF COMPLIANT: Follow-up of metastatic breast cancer after recent CT  INTERVAL HISTORY: Lisa [name] is a 78 y.o. with above-mentioned history of metastatic breast cancer currently on Ibrance with Faslodex and Xgeva. A CT CAP on 06/29/18 showed progression of liver metastases, stable bone metastases, lung metastases, and a pleural effusion. She presents to the clinic alone today to review recent scans and molecular testing, which showed she was negative for PDL-1, PI-K3, BRCA 1 and 2, and positive for AR IHC and ESR 1 mutation. She is currently on Pravastatin. She reports fatigue and SOB, which she relates to her atrial fibrillation. She notes she doesn't think she could tolerate going through chemotherapy again. She was concerned about her prognosis and notes she is trying to get her affairs and will in order.   REVIEW OF SYSTEMS:   Constitutional: Denies fevers, chills or abnormal weight loss (+) fatigue Eyes: Denies blurriness of vision Ears, nose, mouth, throat, and face: Denies mucositis or sore throat Respiratory: Denies cough or wheezes (+) SOB Cardiovascular: Denies palpitation, chest discomfort Gastrointestinal:  Denies nausea, heartburn or change in bowel habits Skin: Denies abnormal skin rashes Lymphatics: Denies new lymphadenopathy or easy bruising Neurological: Denies numbness, tingling  or new weaknesses Behavioral/Psych: Mood is stable, no new changes  Extremities: No lower extremity edema Breast: denies any pain or lumps or nodules in either breasts All other systems were reviewed with the patient and are negative.  I have reviewed the past medical history, past surgical history, social  history and family history with the patient and they are unchanged from previous note.  ALLERGIES:  is allergic to diphenhydramine hcl; codeine sulfate; metoprolol; oseltamivir phosphate; oxycodone-acetaminophen; prednisone; and sudafed [pseudoephedrine hcl].  MEDICATIONS:  Current Outpatient Medications  Medication Sig Dispense Refill  . acetaminophen (TYLENOL) 500 MG tablet Take 500 mg by mouth every 6 (six) hours as needed for moderate pain.    [name] amiodarone (PACERONE) 200 MG tablet Take 1 tablet (200 mg total) by mouth daily. 90 tablet 1  . CARTIA XT 120 MG 24 hr capsule TAKE 1 CAPSULE BY MOUTH ONCE DAILY (Patient taking differently: Take 120 mg by mouth daily. ) 30 capsule 6  . denosumab (PROLIA) 60 MG/ML SOSY injection Inject 60 mg into the skin every 3 (three) months.    [name] ELIQUIS 5 MG TABS tablet TAKE 1 TABLET BY MOUTH TWICE DAILY (Patient taking differently: Take 5 mg by mouth 2 (two) times daily. ) 180 tablet 1  . everolimus (AFINITOR) 10 MG tablet Take 1 tablet (10 mg total) by mouth daily. 30 tablet 3  . exemestane (AROMASIN) 25 MG tablet Take 1 tablet (25 mg total) by mouth daily after breakfast. 90 tablet 3  . fulvestrant (FASLODEX) 250 MG/5ML injection Inject 250 mg into the muscle every 30 (thirty) days. One injection each buttock over 1-2 minutes. Warm prior to use.     . furosemide (LASIX) 40 MG tablet TAKE 1 TABLET (40 MG TOTAL) BY MOUTH DAILY. 90 tablet 2  . IBRANCE 100 MG capsule TAKE 1 CAPSULE BY MOUTH ONCE DAILY WITH BREAKFAST. TAKE WHOLE WITH FOOD. TAKE FOR 3 WEEKS ON; 1 WEEK OFF--ALTERNATING. (Patient taking differently: Take 100 mg by mouth See admin instructions. Take 1 capsule (100 mg( daily with breakfast cyclically for 3 weeks on & 1 week off) 21 capsule 2  . loratadine (CLARITIN) 10 MG tablet Take 10 mg by mouth daily.     [name] losartan (COZAAR) 50 MG tablet Take 100 mg daily by mouth.  2  . MECLIZINE HCL PO Take 1 tablet by mouth as needed.    . Multiple  Vitamins-Minerals (PRESERVISION AREDS 2+MULTI VIT PO) Take 1 tablet by mouth 2 (two) times daily.     . ondansetron (ZOFRAN) 4 MG tablet Take 4 mg by mouth every 6 (six) hours as needed for nausea or vomiting.    Vladimir Faster Glycol-Propyl Glycol (LUBRICANT EYE DROPS) 0.4-0.3 % SOLN Place 1 drop into both eyes 3 (three) times daily as needed (for dry/irritated eyes.).    [name] Potassium 99 MG TABS Take 198 mg by mouth 2 (two) times daily.     . pravastatin (PRAVACHOL) 40 MG tablet Take 1 tablet (40 mg total) by mouth every evening. 90 tablet 3  . vitamin B-12 (CYANOCOBALAMIN) 1000 MCG tablet Take 1,000 mcg by mouth 2 (two) times daily.    . vitamin C (ASCORBIC ACID) 500 MG tablet Take 500 mg by mouth daily.       No current facility-administered medications for this visit.     PHYSICAL EXAMINATION: ECOG PERFORMANCE STATUS: 1 - Symptomatic but completely ambulatory  Vitals:   07/29/18 1338  BP: (!) 143/72  Pulse: 80  Resp: 16  Temp: 98.9 F (37.2 C)  SpO2: 96%   Filed Weights   07/29/18 1338  Weight: 176 lb 12.8 oz (80.2 kg)    GENERAL:alert, no distress and comfortable SKIN: skin color, texture, turgor are normal, no rashes or significant lesions EYES: normal, Conjunctiva are pink and non-injected, sclera clear OROPHARYNX:no exudate, no erythema and lips, buccal mucosa, and tongue normal  NECK: supple, thyroid normal size, non-tender, without nodularity LYMPH:  no palpable lymphadenopathy in the cervical, axillary or inguinal LUNGS: clear to auscultation and percussion with normal breathing effort HEART: regular rate & rhythm and no murmurs and no lower extremity edema ABDOMEN:abdomen soft, non-tender and normal bowel sounds MUSCULOSKELETAL:no cyanosis of digits and no clubbing  NEURO: alert & oriented x 3 with fluent speech, no focal motor/sensory deficits EXTREMITIES: No lower extremity edema  LABORATORY DATA:  I have reviewed the data as listed CMP Latest Ref Rng & Units  07/29/2018 07/01/2018 06/26/2018  Glucose 70 - 99 mg/dL 120(H) 85 96  BUN 8 - 23 mg/dL 19 19 -  Creatinine 0.44 - 1.00 mg/dL 1.00 1.12(H) -  Sodium 135 - 145 mmol/L 140 141 140  Potassium 3.5 - 5.1 mmol/L 3.4(L) 4.0 4.1  Chloride 98 - 111 mmol/L 107 108 -  CO2 22 - 32 mmol/L 24 22 -  Calcium 8.9 - 10.3 mg/dL 8.8(L) 8.8(L) -  Total Protein 6.5 - 8.1 g/dL 6.9 7.1 -  Total Bilirubin 0.3 - 1.2 mg/dL 1.0 0.8 -  Alkaline Phos 38 - 126 U/L 118 117 -  AST 15 - 41 U/L 53(H) 42(H) -  ALT 0 - 44 U/L 31 19 -    Lab Results  Component Value Date   WBC 2.8 (L) 07/29/2018   HGB 11.7 (L) 07/29/2018   HCT 35.3 (L) 07/29/2018   MCV 104.1 (H) 07/29/2018   PLT 204 07/29/2018   NEUTROABS 1.8 07/29/2018    ASSESSMENT & PLAN:  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 [facility]) Metastatic breast cancer tolungs and bone: Clinically improving onweekly taxol beginning 05-09-16, several delays including for counts, rapid A fib, respiratory infection etc  Treatment summary:  1. Taxol days 1 and 8 every 3 weeks with Xgeva; completed 4cycles from 05/09/2016-08/29/2016 2. LetrozolewithPalbociclib started 10/01/2016 switched to Faslodex with Ibrance 02/03/2018 discontinued 07/29/2018  Molecular testing Caris:PDL 1: 0%; AR IHC +90%, ESR 1 mutation ,negative for PI K3 CA mutation, negative for BRCA 1 and 2 mutations  Treatment plan: Exemestane with everolimus If she progresses on this then we will have to do chemotherapy.   I discussed the risks and benefits of everolimus treatment including fatigue, elevated blood sugars and lipid panels.  We discussed cytopenias risk of infection and mouth sores.  Her oral chemotherapy pharmacist will be consulted to counsel her regarding the treatment and obtain her medication.  Return to clinic in 3 weeks for follow-up with labs     Orders Placed This Encounter  Procedures  . CBC with Differential (Cancer Center Only)    Standing Status:   Future    Standing Expiration  Date:   07/30/2019  . CMP ([place] only)    Standing Status:   Future    Standing Expiration Date:   07/30/2019   The patient has a good understanding of the overall plan. she agrees with it. she will call with any problems that may develop before the next visit here.  Nicholas Lose, MD 07/29/2018   Julious Oka Dorshimer, am acting as scribe for Nicholas Lose, MD.  I have reviewed the above documentation for accuracy and completeness, and I agree with the above.

## 2018-07-29 ENCOUNTER — Inpatient Hospital Stay: Payer: Medicare Other | Admitting: Hematology and Oncology

## 2018-07-29 ENCOUNTER — Inpatient Hospital Stay: Payer: Medicare Other

## 2018-07-29 ENCOUNTER — Encounter: Payer: Self-pay | Admitting: Hematology and Oncology

## 2018-07-29 ENCOUNTER — Telehealth: Payer: Self-pay | Admitting: Hematology and Oncology

## 2018-07-29 ENCOUNTER — Other Ambulatory Visit: Payer: Self-pay

## 2018-07-29 ENCOUNTER — Telehealth: Payer: Self-pay | Admitting: Pharmacist

## 2018-07-29 ENCOUNTER — Inpatient Hospital Stay: Payer: Medicare Other | Attending: Hematology and Oncology

## 2018-07-29 DIAGNOSIS — C50919 Malignant neoplasm of unspecified site of unspecified female breast: Secondary | ICD-10-CM

## 2018-07-29 DIAGNOSIS — I4891 Unspecified atrial fibrillation: Secondary | ICD-10-CM | POA: Insufficient documentation

## 2018-07-29 DIAGNOSIS — C787 Secondary malignant neoplasm of liver and intrahepatic bile duct: Secondary | ICD-10-CM | POA: Insufficient documentation

## 2018-07-29 DIAGNOSIS — Z79811 Long term (current) use of aromatase inhibitors: Secondary | ICD-10-CM | POA: Insufficient documentation

## 2018-07-29 DIAGNOSIS — Z170 Estrogen receptor positive status [ER+]: Secondary | ICD-10-CM | POA: Diagnosis not present

## 2018-07-29 DIAGNOSIS — Z79899 Other long term (current) drug therapy: Secondary | ICD-10-CM | POA: Diagnosis not present

## 2018-07-29 DIAGNOSIS — C7802 Secondary malignant neoplasm of left lung: Secondary | ICD-10-CM | POA: Insufficient documentation

## 2018-07-29 DIAGNOSIS — C7801 Secondary malignant neoplasm of right lung: Secondary | ICD-10-CM | POA: Diagnosis not present

## 2018-07-29 DIAGNOSIS — C50911 Malignant neoplasm of unspecified site of right female breast: Secondary | ICD-10-CM

## 2018-07-29 DIAGNOSIS — C7951 Secondary malignant neoplasm of bone: Principal | ICD-10-CM

## 2018-07-29 DIAGNOSIS — C782 Secondary malignant neoplasm of pleura: Secondary | ICD-10-CM | POA: Diagnosis not present

## 2018-07-29 DIAGNOSIS — Z7901 Long term (current) use of anticoagulants: Secondary | ICD-10-CM | POA: Insufficient documentation

## 2018-07-29 LAB — CBC WITH DIFFERENTIAL (CANCER CENTER ONLY)
Abs Immature Granulocytes: 0.02 10*3/uL (ref 0.00–0.07)
Basophils Absolute: 0.1 10*3/uL (ref 0.0–0.1)
Basophils Relative: 3 %
Eosinophils Absolute: 0.1 10*3/uL (ref 0.0–0.5)
Eosinophils Relative: 3 %
HCT: 35.3 % — ABNORMAL LOW (ref 36.0–46.0)
Hemoglobin: 11.7 g/dL — ABNORMAL LOW (ref 12.0–15.0)
Immature Granulocytes: 1 %
Lymphocytes Relative: 18 %
Lymphs Abs: 0.5 10*3/uL — ABNORMAL LOW (ref 0.7–4.0)
MCH: 34.5 pg — AB (ref 26.0–34.0)
MCHC: 33.1 g/dL (ref 30.0–36.0)
MCV: 104.1 fL — ABNORMAL HIGH (ref 80.0–100.0)
MONO ABS: 0.4 10*3/uL (ref 0.1–1.0)
Monocytes Relative: 13 %
Neutro Abs: 1.8 10*3/uL (ref 1.7–7.7)
Neutrophils Relative %: 62 %
Platelet Count: 204 10*3/uL (ref 150–400)
RBC: 3.39 MIL/uL — AB (ref 3.87–5.11)
RDW: 14.8 % (ref 11.5–15.5)
WBC Count: 2.8 10*3/uL — ABNORMAL LOW (ref 4.0–10.5)
nRBC: 0 % (ref 0.0–0.2)

## 2018-07-29 LAB — CMP (CANCER CENTER ONLY)
ALT: 31 U/L (ref 0–44)
AST: 53 U/L — ABNORMAL HIGH (ref 15–41)
Albumin: 3.5 g/dL (ref 3.5–5.0)
Alkaline Phosphatase: 118 U/L (ref 38–126)
Anion gap: 9 (ref 5–15)
BUN: 19 mg/dL (ref 8–23)
CALCIUM: 8.8 mg/dL — AB (ref 8.9–10.3)
CO2: 24 mmol/L (ref 22–32)
Chloride: 107 mmol/L (ref 98–111)
Creatinine: 1 mg/dL (ref 0.44–1.00)
GFR, Est AFR Am: >60 mL/min (ref >60)
GFR, Estimated: 55 mL/min — ABNORMAL LOW (ref >60)
Glucose, Bld: 120 mg/dL — ABNORMAL HIGH (ref 70–99)
Potassium: 3.4 mmol/L — ABNORMAL LOW (ref 3.5–5.1)
Sodium: 140 mmol/L (ref 135–145)
Total Bilirubin: 1 mg/dL (ref 0.3–1.2)
Total Protein: 6.9 g/dL (ref 6.5–8.1)

## 2018-07-29 MED ORDER — FULVESTRANT 250 MG/5ML IM SOLN
INTRAMUSCULAR | Status: AC
  Filled 2018-07-29: qty 5

## 2018-07-29 MED ORDER — EVEROLIMUS 10 MG PO TABS: 10.0000 mg | ORAL_TABLET | Freq: Every day | ORAL | 3 refills | Status: DC

## 2018-07-29 MED ORDER — EVEROLIMUS 2.5 MG PO TABS: 2.5000 mg | ORAL_TABLET | Freq: Every day | ORAL | 1 refills | Status: DC

## 2018-07-29 MED ORDER — DEXAMETHASONE 0.5 MG/5ML PO SOLN: ORAL | 3 refills | Status: DC

## 2018-07-29 MED ORDER — EXEMESTANE 25 MG PO TABS: 25.0000 mg | ORAL_TABLET | Freq: Every day | ORAL | 3 refills | Status: DC

## 2018-07-29 MED FILL — DILTIAZEM HCL ER COATED BEA: 120 | 30 days supply | Qty: 30 | Fill #1

## 2018-07-29 NOTE — Telephone Encounter (Signed)
Oral Oncology Pharmacist Encounter  Received new prescription for Afinitor (everolimus) for the treatment of metastatic, hormone receptor positive breast canecr in conjunction with exemestane, planned duration until disease progression or unacceptable toxicity.  Labs from Epic assessed, okay for treatment. BPs reviewed, will continue to be monitored.  Current medication list in Epic reviewed, DDIs with Afinitor identified:  Category D interaction: Diltiazem and Afinitor: Diltiazem is a moderate inhibitor of CYP 3 A4 possibly leading to decreased metabolism and increase systemic exposure to Afinitor.  Manufacturer recommends to reduce everolimus adult dose to 2.5 mg/day with subsequent increase to 5 mg/day as tolerated.  This will be discussed with MD.  Category C interaction: Amiodarone and Afinitor: Amiodarone is a known PGP inhibitor leading to likely increased Afinitor AUC and Cmax 2-3 fold when medications are used concurrently.  This will be discussed with MD.  Prescription has been e-scribed to the [name] for benefits analysis and approval.  Oral Oncology Clinic will continue to follow for insurance authorization, copayment issues, initial counseling and start date.  Johny Drilling, PharmD, BCPS, BCOP  07/29/2018 2:10 PM Oral Oncology Clinic [phone]

## 2018-07-29 NOTE — Telephone Encounter (Signed)
Gave avs and calendar ° °

## 2018-07-29 NOTE — Assessment & Plan Note (Signed)
Metastatic breast cancer tolungs and bone: Clinically improving onweekly taxol beginning 05-09-16, several delays including for counts, rapid A fib, respiratory infection etc  Treatment summary:  1. Taxol days 1 and 8 every 3 weeks with Xgeva; completed 4cycles from 05/09/2016-08/29/2016 2. LetrozolewithPalbociclib started 10/01/2016 switched to Faslodex with Ibrance 02/03/2018 discontinued 07/29/2018  Molecular testing Caris:PDL 1: 0%; AR IHC +90%, ESR 1 mutation ,negative for PI K3 CA mutation, negative for BRCA 1 and 2 mutations  Treatment plan: Exemestane with everolimus If she progresses on this then we will have to do chemotherapy.   I discussed the risks and benefits of everolimus treatment including fatigue, elevated blood sugars and lipid panels.  We discussed cytopenias risk of infection and mouth sores.  Her oral chemotherapy pharmacist will be consulted to counsel her regarding the treatment and obtain her medication.  Return to clinic in 1 month for follow-up with labs

## 2018-07-29 NOTE — Telephone Encounter (Signed)
Oral Chemotherapy Pharmacist Encounter   I spoke with patient in exam room for overview of: Afinitor (everolimus) for the treatment of metastatic, hormone-receptor positive breast cancer in conjunction with exemestane, planned duration until disease progression or unacceptable toxicity.   Counseled patient on administration, dosing, side effects, monitoring, drug-food interactions, safe handling, storage, and disposal.  Patient informed about Afinitor medication interactions with Cartia XT and amiodarone, which necessitated dose reduction of the Afinitor.  Patient will take Afinitor 2.5mg  tablets, 1 tablet by mouth once daily, with water, without regard to food.  Patient understands to take Afinitor consistently with regards to food and at approximately the same time each day.  She understands Afinitor dose may be increased in the future based on toleration to 5mg  daily.  Patient knows to aviod grapefruit or grapefruit juice while on therapy with Afinitor.  Patient will take exemestane 25mg  tablets, 1 tablet by mouth once daily after a meal. Patient plans to take her Afinitor and exemestane with breakfast daily.  Afinitor and exemestane start date: TBD, pending medication acquisition  Patient will continue on Ibrance daily at this time and discontinue it as soon as Afinitor is acquired.  Adverse effects include but are not limited to: mouth sores, GI upset, nausea, diarrhea, constipation, rash, increased blood sugars, decreased blood counts, increased blood pressure, and edema.   Dexamethasone mouthwash for the prevention of stomatitis has been e-scribed to the [employer].  We discussed appropriate use of mouthwash and duration of stomatitis prevention.  Reviewed with patient importance of keeping a medication schedule and plan for any missed doses.  Lisa [name] voiced understanding and appreciation.   All questions answered. Medication reconciliation performed  and medication/allergy list updated.  Will follow up with patient regarding insurance and pharmacy once insurance authorization is approved and copayment is known.   Patient knows to call the office with questions or concerns. Oral Oncology Clinic will continue to follow.  Johny Drilling, PharmD, BCPS, BCOP  07/29/2018 3:44 PM Oral Oncology Clinic [phone]

## 2018-07-30 ENCOUNTER — Telehealth: Payer: Self-pay

## 2018-07-30 LAB — TSH: TSH: 3.903 u[IU]/mL (ref 0.308–3.960)

## 2018-07-30 NOTE — Telephone Encounter (Signed)
Oral Oncology Patient Advocate Encounter  Received notification from [place] that prior authorization for Afinitor is required.  PA submitted on CoverMyMeds Key [id number] Status is pending  Oral Oncology Clinic will continue to follow.  [employer] Patient [place] Phone [phone] Fax [phone]

## 2018-07-30 NOTE — Telephone Encounter (Signed)
Oral Oncology Patient Advocate Encounter  Prior Authorization for Afinitor has been approved.    PA# [id number] Effective dates: 07/30/18 through 07/22/19  Oral Oncology Clinic will continue to follow.   [place] Patient [place] Phone [phone] Fax [phone]

## 2018-07-31 MED FILL — AFINITOR 2.5 MG TABLET: 2.5 | 28 days supply | Qty: 28 | Fill #0

## 2018-08-03 MED FILL — LOSARTAN POTASSIUM 50 MG TA: 50 | 30 days supply | Qty: 60 | Fill #0

## 2018-08-03 NOTE — Telephone Encounter (Signed)
Oral Oncology Patient Advocate Encounter  Confirmed with [place] that Afinitor was picked up on 07/31/18 using a $100 copay.   [place] Patient [place] Phone [phone] Fax [phone]

## 2018-08-17 ENCOUNTER — Other Ambulatory Visit: Payer: Self-pay | Admitting: Cardiovascular Disease

## 2018-08-17 MED FILL — ELIQUIS 5 MG TABLET: 5 | 90 days supply | Qty: 180 | Fill #0

## 2018-08-17 NOTE — Telephone Encounter (Signed)
Pt is a 78 yr old female who saw Dr. Sallyanne Kuster on 07/24/18. Weight on 07/29/18 was 80.2Kg. SCr on 07/29/18 was 1.0, will refill Eliquis 5mg  BID.

## 2018-08-17 NOTE — Telephone Encounter (Signed)
Please review for refill. Thanks!  

## 2018-08-19 NOTE — Progress Notes (Signed)
Patient Care Team: [name], MD as PCP - General (Internal Medicine) [name], MD as PCP - Cardiology (Cardiology)  DIAGNOSIS:    ICD-10-CM   1. 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 Findings concerning for liver metastases, numerous pulmonary nodules bilaterally    05/09/2016 - 09/18/2016 Chemotherapy    Taxol weekly initially and later changed her day 1 day 8 every 3 weeks (complicated by A. fib with RVR and prolonged respiratory infection) along with Xgeva     09/19/2016 Procedure    Biopsy of lung mass: Metastatic carcinoma consistent with breast origin, HER-2 negative ratio 1.37    10/01/2016 -  Anti-estrogen oral therapy    LetrozolewithPalbociclib (Ibrance)started 10/01/2016 switched to Faslodex with Ibrance 02/03/2018    06/23/2017 Imaging    CT chest abdomen pelvis: No evidence of progression of metastatic disease, bone  metastases stable right-sided pleural metastases and pleural effusions are stable    06/29/2018 Imaging    Progression of disease in the liver, lung and bones    07/09/2018 Miscellaneous    PDL 1: 0%; AR IHC +90%, ESR 1 mutation ,negative for PI K3 CA mutation, negative for BRCA 1 and 2 mutations     CHIEF COMPLIANT: Follow-up of exemestane with everolimus  INTERVAL HISTORY: Lisa [name] is a 78 y.o. with above-mentioned history of metastatic breast cancer currently on Ibrance with Faslodex and Xgeva. She presents to the clinic alone today. Her tiredness and weakness have worsened and are severe. She has mild headaches that worsen after presenting. She had loose stool and then became severely constipated. She had pain in the middle of her chest and vomited twice. She reports shortness of breath. She denies mouth sores. She did not pick up her exemestane prescription at the pharmacy and has not been taking it.   REVIEW OF SYSTEMS:   Constitutional: Denies fevers, chills or abnormal weight loss (+) headaches (+) severe fatigue (+) weakness Eyes: Denies blurriness of vision Ears, nose, mouth, throat, and face: Denies mucositis or sore throat Respiratory: Denies cough or wheezes (+) dyspnea  Cardiovascular: Denies palpitation (+) chest discomfort Gastrointestinal: Denies nausea, heartburn (+) severe constipation (+) vomiting Skin: Denies abnormal skin rashes Lymphatics: Denies new lymphadenopathy or easy bruising Neurological: Denies numbness, tingling or new weaknesses Behavioral/Psych: Mood is stable, no new changes  Extremities: No lower extremity edema Breast: denies any pain or lumps or  nodules in either breasts All other systems were reviewed with the patient and are negative.  I have reviewed the past medical history, past surgical history, social history and family history with the patient and they are unchanged from previous note.  ALLERGIES:  is allergic to diphenhydramine  hcl; codeine sulfate; metoprolol; oseltamivir phosphate; oxycodone-acetaminophen; prednisone; and sudafed [pseudoephedrine hcl].  MEDICATIONS:  Current Outpatient Medications  Medication Sig Dispense Refill  . acetaminophen (TYLENOL) 500 MG tablet Take 500 mg by mouth every 6 (six) hours as needed for moderate pain.    [name] amiodarone (PACERONE) 200 MG tablet Take 1 tablet (200 mg total) by mouth daily. 90 tablet 1  . CARTIA XT 120 MG 24 hr capsule TAKE 1 CAPSULE BY MOUTH ONCE DAILY (Patient taking differently: Take 120 mg by mouth daily. ) 30 capsule 6  . denosumab (PROLIA) 60 MG/ML SOSY injection Inject 60 mg into the skin every 3 (three) months.    . dexamethasone (DECADRON) 0.5 MG/5ML solution Swish 40m in mouth for 260m and spit out. Use 4 times daily for 8 weeks, start with Afinitor. Avoid eating/drinking for 1hr after rinse. 500 mL 3  . ELIQUIS 5 MG TABS tablet TAKE 1 TABLET BY MOUTH TWICE DAILY 180 tablet 3  . everolimus (AFINITOR) 2.5 MG tablet Take 1 tablet (2.5 mg total) by mouth daily. 28 tablet 1  . exemestane (AROMASIN) 25 MG tablet Take 1 tablet (25 mg total) by mouth daily after breakfast. 90 tablet 3  . fulvestrant (FASLODEX) 250 MG/5ML injection Inject 250 mg into the muscle every 30 (thirty) days. One injection each buttock over 1-2 minutes. Warm prior to use.     . furosemide (LASIX) 40 MG tablet TAKE 1 TABLET (40 MG TOTAL) BY MOUTH DAILY. 90 tablet 2  . IBRANCE 100 MG capsule TAKE 1 CAPSULE BY MOUTH ONCE DAILY WITH BREAKFAST. TAKE WHOLE WITH FOOD. TAKE FOR 3 WEEKS ON; 1 WEEK OFF--ALTERNATING. (Patient taking differently: Take 100 mg by mouth See admin instructions. Take 1 capsule (100 mg( daily with breakfast cyclically for 3 weeks on & 1 week off) 21 capsule 2  . loratadine (CLARITIN) 10 MG tablet Take 10 mg by mouth daily.     . Marland Kitchenosartan (COZAAR) 50 MG tablet Take 100 mg daily by mouth.  2  . MECLIZINE HCL PO Take 1 tablet by mouth as needed.    . Multiple Vitamins-Minerals  (PRESERVISION AREDS 2+MULTI VIT PO) Take 1 tablet by mouth 2 (two) times daily.     . ondansetron (ZOFRAN) 4 MG tablet Take 4 mg by mouth every 6 (six) hours as needed for nausea or vomiting.    . Vladimir Fasterlycol-Propyl Glycol (LUBRICANT EYE DROPS) 0.4-0.3 % SOLN Place 1 drop into both eyes 3 (three) times daily as needed (for dry/irritated eyes.).    . Marland Kitchenotassium 99 MG TABS Take 198 mg by mouth 2 (two) times daily.     . pravastatin (PRAVACHOL) 40 MG tablet Take 1 tablet (40 mg total) by mouth every evening. 90 tablet 3  . vitamin B-12 (CYANOCOBALAMIN) 1000 MCG tablet Take 1,000 mcg by mouth 2 (two) times daily.    . vitamin C (ASCORBIC ACID) 500 MG tablet Take 500 mg by mouth daily.       No current facility-administered medications for this visit.     PHYSICAL EXAMINATION: ECOG PERFORMANCE STATUS: 1 - Symptomatic but completely ambulatory  Vitals:   08/20/18 1442  BP: 133/68  Pulse: 78  Resp: 16  Temp: 97.6  F (36.4 C)  SpO2: 99%   Filed Weights   08/20/18 1442  Weight: 80.2 kg    GENERAL: alert, no distress and comfortable SKIN: skin color, texture, turgor are normal, no rashes or significant lesions EYES: normal, Conjunctiva are pink and non-injected, sclera clear OROPHARYNX: no exudate, no erythema and lips, buccal mucosa, and tongue normal  NECK: supple, thyroid normal size, non-tender, without nodularity LYMPH: no palpable lymphadenopathy in the cervical, axillary or inguinal LUNGS: clear to auscultation and percussion with normal breathing effort HEART: regular rate & rhythm and no murmurs and no lower extremity edema ABDOMEN: abdomen soft, non-tender and normal bowel sounds MUSCULOSKELETAL: no cyanosis of digits and no clubbing  NEURO: alert & oriented x 3 with fluent speech, no focal motor/sensory deficits EXTREMITIES: No lower extremity edema  LABORATORY DATA:  I have reviewed the data as listed CMP Latest Ref Rng & Units 08/20/2018 07/29/2018 07/01/2018  Glucose  70 - 99 mg/dL 94 120(H) 85  BUN 8 - 23 mg/dL 21 19 19   Creatinine 0.44 - 1.00 mg/dL 1.01(H) 1.00 1.12(H)  Sodium 135 - 145 mmol/L 139 140 141  Potassium 3.5 - 5.1 mmol/L 4.0 3.4(L) 4.0  Chloride 98 - 111 mmol/L 107 107 108  CO2 22 - 32 mmol/L 22 24 22   Calcium 8.9 - 10.3 mg/dL 9.5 8.8(L) 8.8(L)  Total Protein 6.5 - 8.1 g/dL 7.1 6.9 7.1  Total Bilirubin 0.3 - 1.2 mg/dL 0.6 1.0 0.8  Alkaline Phos 38 - 126 U/L 190(H) 118 117  AST 15 - 41 U/L 155(H) 53(H) 42(H)  ALT 0 - 44 U/L 106(H) 31 19    Lab Results  Component Value Date   WBC 3.2 (L) 08/20/2018   HGB 11.6 (L) 08/20/2018   HCT 35.3 (L) 08/20/2018   MCV 103.5 (H) 08/20/2018   PLT 233 08/20/2018   NEUTROABS 1.9 08/20/2018    ASSESSMENT & PLAN:  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 [facility]) Metastatic breast cancer tolungs and bone: Clinically improving onweekly taxol beginning 05-09-16, several delays including for counts, rapid A fib, respiratory infection etc  Treatment summary:  1. Taxol days 1 and 8 every 3 weeks with Xgeva; completed 4cycles from 05/09/2016-08/29/2016 2. LetrozolewithPalbociclib started 10/01/2016 switched to Faslodex with Ibrance 02/03/2018 discontinued 07/29/2018  Molecular testing Caris:PDL 1: 0%; AR IHC +90%, ESR 1 mutation ,negative for PI K3 CA mutation, negative for BRCA 1 and 2 mutations ----------------------------------------------------------------------------------------------------------------------------------------------------- Current treatment: Exemestane with everolimus started 07/29/2018 If she progresses on this then we will have to do chemotherapy.  Toxicities: 1.  Constipation 2. Moderate to severe fatigue 3.  Nausea/vomiting  For some reason she did not pick up exemestane prescription.  I will send another prescription. Denies any diarrhea Return to clinic in 1 month for follow-up.  We will check fasting lipid profile.    Orders Placed This Encounter  Procedures  . Lipid  panel    Standing Status:   Future    Standing Expiration Date:   08/20/2019  . CBC with Differential ([facility])    Standing Status:   Future    Standing Expiration Date:   08/21/2019  . CMP ([place] only)    Standing Status:   Future    Standing Expiration Date:   08/21/2019   The patient has a good understanding of the overall plan. she agrees with it. she will call with any problems that may develop before the next visit here.  Nicholas Lose, MD 08/20/2018  Julious Oka Dorshimer am  acting as scribe for Dr. Nicholas Lose.  I have reviewed the above documentation for accuracy and completeness, and I agree with the above.

## 2018-08-20 ENCOUNTER — Inpatient Hospital Stay: Payer: Medicare Other

## 2018-08-20 ENCOUNTER — Inpatient Hospital Stay: Payer: Medicare Other | Admitting: Hematology and Oncology

## 2018-08-20 ENCOUNTER — Telehealth: Payer: Self-pay | Admitting: Hematology and Oncology

## 2018-08-20 DIAGNOSIS — C7951 Secondary malignant neoplasm of bone: Secondary | ICD-10-CM | POA: Diagnosis not present

## 2018-08-20 DIAGNOSIS — C787 Secondary malignant neoplasm of liver and intrahepatic bile duct: Secondary | ICD-10-CM | POA: Diagnosis not present

## 2018-08-20 DIAGNOSIS — R112 Nausea with vomiting, unspecified: Secondary | ICD-10-CM

## 2018-08-20 DIAGNOSIS — C7801 Secondary malignant neoplasm of right lung: Secondary | ICD-10-CM

## 2018-08-20 DIAGNOSIS — C50911 Malignant neoplasm of unspecified site of right female breast: Secondary | ICD-10-CM

## 2018-08-20 DIAGNOSIS — C782 Secondary malignant neoplasm of pleura: Secondary | ICD-10-CM

## 2018-08-20 DIAGNOSIS — C7802 Secondary malignant neoplasm of left lung: Secondary | ICD-10-CM

## 2018-08-20 LAB — CBC WITH DIFFERENTIAL (CANCER CENTER ONLY)
Abs Immature Granulocytes: 0.02 10*3/uL (ref 0.00–0.07)
Basophils Absolute: 0 10*3/uL (ref 0.0–0.1)
Basophils Relative: 1 %
Eosinophils Absolute: 0.1 10*3/uL (ref 0.0–0.5)
Eosinophils Relative: 2 %
HCT: 35.3 % — ABNORMAL LOW (ref 36.0–46.0)
Hemoglobin: 11.6 g/dL — ABNORMAL LOW (ref 12.0–15.0)
Immature Granulocytes: 1 %
LYMPHS PCT: 18 %
Lymphs Abs: 0.6 10*3/uL — ABNORMAL LOW (ref 0.7–4.0)
MCH: 34 pg (ref 26.0–34.0)
MCHC: 32.9 g/dL (ref 30.0–36.0)
MCV: 103.5 fL — AB (ref 80.0–100.0)
MONOS PCT: 21 %
Monocytes Absolute: 0.7 10*3/uL (ref 0.1–1.0)
Neutro Abs: 1.9 10*3/uL (ref 1.7–7.7)
Neutrophils Relative %: 57 %
Platelet Count: 233 10*3/uL (ref 150–400)
RBC: 3.41 MIL/uL — ABNORMAL LOW (ref 3.87–5.11)
RDW: 13.9 % (ref 11.5–15.5)
WBC Count: 3.2 10*3/uL — ABNORMAL LOW (ref 4.0–10.5)
nRBC: 0 % (ref 0.0–0.2)

## 2018-08-20 LAB — CMP (CANCER CENTER ONLY)
ALT: 106 U/L — ABNORMAL HIGH (ref 0–44)
AST: 155 U/L — ABNORMAL HIGH (ref 15–41)
Albumin: 3.5 g/dL (ref 3.5–5.0)
Alkaline Phosphatase: 190 U/L — ABNORMAL HIGH (ref 38–126)
Anion gap: 10 (ref 5–15)
BUN: 21 mg/dL (ref 8–23)
CO2: 22 mmol/L (ref 22–32)
Calcium: 9.5 mg/dL (ref 8.9–10.3)
Chloride: 107 mmol/L (ref 98–111)
Creatinine: 1.01 mg/dL — ABNORMAL HIGH (ref 0.44–1.00)
GFR, Estimated: 54 mL/min — ABNORMAL LOW (ref >60)
GLUCOSE: 94 mg/dL (ref 70–99)
Potassium: 4 mmol/L (ref 3.5–5.1)
Sodium: 139 mmol/L (ref 135–145)
Total Bilirubin: 0.6 mg/dL (ref 0.3–1.2)
Total Protein: 7.1 g/dL (ref 6.5–8.1)

## 2018-08-20 MED ORDER — EXEMESTANE 25 MG PO TABS: 25.0000 mg | ORAL_TABLET | Freq: Every day | ORAL | 3 refills | Status: DC

## 2018-08-20 MED FILL — EXEMESTANE 25 MG TABS: 25 | 90 days supply | Qty: 90 | Fill #0

## 2018-08-20 NOTE — Assessment & Plan Note (Signed)
Metastatic breast cancer tolungs and bone: Clinically improving onweekly taxol beginning 05-09-16, several delays including for counts, rapid A fib, respiratory infection etc  Treatment summary:  1. Taxol days 1 and 8 every 3 weeks with Xgeva; completed 4cycles from 05/09/2016-08/29/2016 2. LetrozolewithPalbociclib started 10/01/2016 switched to Faslodex with Ibrance 02/03/2018 discontinued 07/29/2018  Molecular testing Caris:PDL 1: 0%; AR IHC +90%, ESR 1 mutation ,negative for PI K3 CA mutation, negative for BRCA 1 and 2 mutations ----------------------------------------------------------------------------------------------------------------------------------------------------- Current treatment: Exemestane with everolimus started 07/29/2018 If she progresses on this then we will have to do chemotherapy.  Toxicities:  Return to clinic in 1 month for follow-up

## 2018-08-20 NOTE — Telephone Encounter (Signed)
Gave avs and calendar ° °

## 2018-08-21 NOTE — Addendum Note (Signed)
Encounter addended by: Pricilla Larsson, [facility] on: 08/21/2018 9:33 AM  Actions taken: Order Reconciliation Section accessed

## 2018-08-23 MED FILL — FUROSEMIDE 40 MG TAB: 40 | 90 days supply | Qty: 90 | Fill #2

## 2018-08-24 ENCOUNTER — Telehealth: Payer: Self-pay | Admitting: Hematology and Oncology

## 2018-08-24 ENCOUNTER — Other Ambulatory Visit: Payer: Self-pay | Admitting: Cardiovascular Disease

## 2018-08-24 ENCOUNTER — Telehealth: Payer: Self-pay | Admitting: *Deleted

## 2018-08-24 MED FILL — PRAVASTATIN NA 40 MG TAB: 40 | 90 days supply | Qty: 90 | Fill #0

## 2018-08-24 MED FILL — DILTIAZEM HCL ER COATED BEA: 120 | 30 days supply | Qty: 30 | Fill #2

## 2018-08-24 MED FILL — AFINITOR 2.5 MG TABLET: 2.5 | 28 days supply | Qty: 28 | Fill #1

## 2018-08-24 NOTE — Telephone Encounter (Signed)
Patient called to reschedule previously cancelled 02/27 appt, rescheduled, informed patient

## 2018-08-24 NOTE — Telephone Encounter (Signed)
Tried to reach regarding voicemail °

## 2018-08-24 NOTE — Telephone Encounter (Signed)
Rx request sent to pharmacy.  

## 2018-08-24 NOTE — Telephone Encounter (Signed)
Medical records faxed to [place]; RI# [id number]

## 2018-08-26 MED FILL — LOSARTAN POTASSIUM 50 MG TA: 50 | 30 days supply | Qty: 60 | Fill #1

## 2018-09-14 ENCOUNTER — Telehealth: Payer: Self-pay

## 2018-09-14 NOTE — Telephone Encounter (Signed)
Pt called to report severe diarrhea X 4 days.  Per pt started on Friday.  7 episodes of loose watery stools per pt on Friday.  Slowly decreased over the weekend, and pt reports no loose stools since yesterday morning.  Pt with increased weakness, however reports feeling much better today compared to previous dates.  Pt stated, "I'm stopping those two medications he put me on until I see him again, because I think they are working against me."  Pt denies any fever.  Nurse encouraged fluids and bland foods. Pt is scheduled for follow up with MD, nurse confirmed appointment and patient will keep this to review medications and symptoms.  Nurse reviewed medications, encouraged to hold medications per MD recommendations until she is evaluated.  Pt educated on s/s of dehydration and voiced understanding.  Will call office if any changes prior to upcoming appointment.

## 2018-09-14 NOTE — Telephone Encounter (Signed)
Pt called to report severe diarrhea

## 2018-09-16 NOTE — Progress Notes (Signed)
Patient Care Team: [name], MD as PCP - General (Internal Medicine) [name], MD as PCP - Cardiology (Cardiology)  DIAGNOSIS:    ICD-10-CM   1. 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 Findings concerning for liver metastases, numerous pulmonary nodules bilaterally    05/09/2016 - 09/18/2016 Chemotherapy    Taxol weekly initially and later changed her day 1 day 8 every 3 weeks (complicated by A. fib with RVR and prolonged respiratory infection) along with Xgeva    09/19/2016 Procedure    Biopsy of lung mass: Metastatic carcinoma consistent with breast origin, HER-2 negative ratio 1.37    10/01/2016 -  Anti-estrogen oral therapy    LetrozolewithPalbociclib (Ibrance)started 10/01/2016 switched to Faslodex with Ibrance 02/03/2018    06/23/2017 Imaging    CT chest abdomen pelvis: No evidence of progression of metastatic disease, bone metastases stable right-sided pleural metastases and pleural effusions are stable    06/29/2018 Imaging    Progression of disease in the liver, lung and bones    07/09/2018 Miscellaneous    PDL 1: 0%; AR IHC +90%, ESR 1 mutation ,negative for PI K3 CA mutation, negative for BRCA 1 and 2 mutations     CHIEF COMPLIANT: Follow-up of Faslodex with Ibrance  INTERVAL HISTORY: Lisa [name] is a 78 y.o. with above-mentioned history of metastatic breast cancer currently onIbrancewith Faslodexand Delton See.She called the clinic on 09/14/18 to report severe diarrhea for four days after starting Ibrance in addition to increased weakness, so she stopped Ibrance on 09/14/18. She presents to the clinic alone today. She reports nausea, headache, vomiting 2x daily for 2 days last week, heartburn, chest discomfort, and dehydration despite drinking copious water. She was constipated for 4-5 days 2-3 weeks ago for which she took a stool softener. She reports 7 episodes of severe diarrhea daily last Thursday-Saturday and 2 episodes on Sunday. She did not take imodium and an antidiarrheal from a drug store was unhelpful. She has not had a BM in 4 days. She reports overall weakness and difficulty walking, she uses a cane to ambulate. Her labs from today show: WBC 3.3, Hg 12.4, platelets 187, ANC 2.1. She reviewed her medication list with me.   REVIEW OF SYSTEMS:   Constitutional: Denies fevers, chills or abnormal weight loss (+) headaches (+) malaise, weakness Eyes: Denies blurriness of vision Ears, nose, mouth, throat, and face: Denies mucositis or sore throat Respiratory: Denies cough, dyspnea or wheezes Cardiovascular: Denies palpitation (+) chest discomfort Gastrointestinal: (+)  nausea (+) heartburn (+) severe diarrhea (+) vomiting (+) constipation Skin: Denies abnormal skin rashes Lymphatics: Denies new lymphadenopathy or easy bruising Neurological: Denies numbness, tingling or new weaknesses (+) unsteady gait Behavioral/Psych: Mood is stable, no new changes  Extremities: No lower extremity  edema Breast: denies any pain or lumps or nodules in either breasts All other systems were reviewed with the patient and are negative.  I have reviewed the past medical history, past surgical history, social history and family history with the patient and they are unchanged from previous note.  ALLERGIES:  is allergic to diphenhydramine hcl; codeine sulfate; metoprolol; oseltamivir phosphate; oxycodone-acetaminophen; prednisone; and sudafed [pseudoephedrine hcl].  MEDICATIONS:  Current Outpatient Medications  Medication Sig Dispense Refill  . acetaminophen (TYLENOL) 500 MG tablet Take 500 mg by mouth every 6 (six) hours as needed for moderate pain.    [name] amiodarone (PACERONE) 200 MG tablet Take 1 tablet (200 mg total) by mouth daily. 90 tablet 1  . CARTIA XT 120 MG 24 hr capsule TAKE 1 CAPSULE BY MOUTH ONCE DAILY (Patient taking differently: Take 120 mg by mouth daily. ) 30 capsule 6  . denosumab (PROLIA) 60 MG/ML SOSY injection Inject 60 mg into the skin every 3 (three) months.    . diphenoxylate-atropine (LOMOTIL) 2.5-0.025 MG tablet Take 1 tablet by mouth 4 (four) times daily as needed for diarrhea or loose stools. 30 tablet 1  . ELIQUIS 5 MG TABS tablet TAKE 1 TABLET BY MOUTH TWICE DAILY 180 tablet 3  . everolimus (AFINITOR) 2.5 MG tablet Take 1 tablet (2.5 mg total) by mouth daily. 28 tablet 1  . exemestane (AROMASIN) 25 MG tablet Take 1 tablet (25 mg total) by mouth daily after breakfast. 90 tablet 3  . furosemide (LASIX) 40 MG tablet TAKE 1 TABLET (40 MG TOTAL) BY MOUTH DAILY. 90 tablet 2  . loratadine (CLARITIN) 10 MG tablet Take 10 mg by mouth daily.     [name] losartan (COZAAR) 50 MG tablet Take 100 mg daily by mouth.  2  . MECLIZINE HCL PO Take 1 tablet by mouth as needed.    . Multiple Vitamins-Minerals (PRESERVISION AREDS 2+MULTI VIT PO) Take 1 tablet by mouth 2 (two) times daily.     . ondansetron (ZOFRAN) 8 MG tablet Take 1 tablet (8 mg total) by mouth every 6 (six) hours as needed  for nausea or vomiting. 30 tablet 6  . Polyethyl Glycol-Propyl Glycol (LUBRICANT EYE DROPS) 0.4-0.3 % SOLN Place 1 drop into both eyes 3 (three) times daily as needed (for dry/irritated eyes.).    [name] Potassium 99 MG TABS Take 198 mg by mouth 2 (two) times daily.     . pravastatin (PRAVACHOL) 40 MG tablet TAKE 1 TABLET BY MOUTH EVERY EVENING 90 tablet 3  . vitamin B-12 (CYANOCOBALAMIN) 1000 MCG tablet Take 1,000 mcg by mouth 2 (two) times daily.    . vitamin C (ASCORBIC ACID) 500 MG tablet Take 500 mg by mouth daily.       No current facility-administered medications for this visit.     PHYSICAL EXAMINATION: ECOG PERFORMANCE STATUS: 1 - Symptomatic but completely ambulatory  Vitals:   09/17/18 0905  BP: 132/62  Pulse: 71  Resp: 16  Temp: 98.4 F (36.9 C)  SpO2: 100%   Filed Weights   09/17/18 0905  Weight: 174 lb 1.6 oz (79 kg)    GENERAL: alert, no distress and comfortable SKIN: skin color, texture, turgor are normal, no rashes or significant lesions EYES: normal,  Conjunctiva are pink and non-injected, sclera clear OROPHARYNX: no exudate, no erythema and lips, buccal mucosa, and tongue normal  NECK: supple, thyroid normal size, non-tender, without nodularity LYMPH: no palpable lymphadenopathy in the cervical, axillary or inguinal LUNGS: clear to auscultation and percussion with normal breathing effort HEART: regular rate & rhythm and no murmurs and no lower extremity edema ABDOMEN: abdomen soft, non-tender and normal bowel sounds MUSCULOSKELETAL: no cyanosis of digits and no clubbing  NEURO: alert & oriented x 3 with fluent speech, no focal motor/sensory deficits EXTREMITIES: No lower extremity edema  LABORATORY DATA:  I have reviewed the data as listed CMP Latest Ref Rng & Units 09/17/2018 08/20/2018 07/29/2018  Glucose 70 - 99 mg/dL 99 94 120(H)  BUN 8 - 23 mg/dL _0 Creatinine 0.44 - 1.00 mg/dL 1.08(H) 1.01(H) 1.00  Sodium 135 - 145 mmol/L 141 139 140  Potassium 3.5  - 5.1 mmol/L 3.6 4.0 3.4(L)  Chloride 98 - 111 mmol/L 110 107 107  CO2 22 - 32 mmol/L _1 Calcium 8.9 - 10.3 mg/dL 11.6(H) 9.5 8.8(L)  Total Protein 6.5 - 8.1 g/dL 7.1 7.1 6.9  Total Bilirubin 0.3 - 1.2 mg/dL 0.7 0.6 1.0  Alkaline Phos 38 - 126 U/L 192(H) 190(H) 118  AST 15 - 41 U/L 85(H) 155(H) 53(H)  ALT 0 - 44 U/L 54(H) 106(H) 31    Lab Results  Component Value Date   WBC 3.3 (L) 09/17/2018   HGB 12.4 09/17/2018   HCT 38.3 09/17/2018   MCV 97.5 09/17/2018   PLT 187 09/17/2018   NEUTROABS 2.1 09/17/2018    ASSESSMENT & PLAN:  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 [facility]) Metastatic breast cancer tolungs and bone: Clinically improving onweekly taxol beginning 05-09-16, several delays including for counts, rapid A fib, respiratory infection etc  Treatment summary: 1.Taxol days 1 and 8 every 3 weeks with Xgeva; completed 4cycles from 05/09/2016-08/29/2016 2.LetrozolewithPalbociclib started 10/01/2016 switched to Faslodex with Ibrance 7/16/2019discontinued 07/29/2018  Molecular testing Caris:PDL 1: 0%; AR IHC +90%, ESR 1 mutation ,negative for PI K3 CA mutation, negative for BRCA 1 and 2 mutations ----------------------------------------------------------------------------------------------------------------------------------------------------- Current treatment: Exemestane with everolimus started 07/29/2018 If she progresses on this then we will have to do chemotherapy.  Toxicities: 1.    Diarrhea: I give her a prescription for Lomotil. 2. Moderate to severe fatigue 3.  Nausea/vomiting: I instructed her to take Zofran 1 hour prior to taking Afinitor.  Monitoring glucose and lipid levels. Return to clinic for follow-up in March along with her injection appointment.    No orders of the defined types were placed in this encounter.  The patient has a good understanding of the overall plan. she agrees with it. she will call with any problems that may develop before  the next visit here.  Nicholas Lose, MD 09/17/2018  Julious Oka Dorshimer am acting as scribe for Dr. Nicholas Lose.  I have reviewed the above documentation for accuracy and completeness, and I agree with the above.

## 2018-09-17 ENCOUNTER — Other Ambulatory Visit: Payer: Medicare Other

## 2018-09-17 ENCOUNTER — Inpatient Hospital Stay: Payer: Medicare Other | Attending: Hematology and Oncology

## 2018-09-17 ENCOUNTER — Ambulatory Visit: Payer: Medicare Other | Admitting: Hematology and Oncology

## 2018-09-17 ENCOUNTER — Telehealth: Payer: Self-pay | Admitting: Hematology and Oncology

## 2018-09-17 ENCOUNTER — Inpatient Hospital Stay: Payer: Medicare Other | Admitting: Hematology and Oncology

## 2018-09-17 DIAGNOSIS — C787 Secondary malignant neoplasm of liver and intrahepatic bile duct: Secondary | ICD-10-CM

## 2018-09-17 DIAGNOSIS — Z923 Personal history of irradiation: Secondary | ICD-10-CM | POA: Diagnosis not present

## 2018-09-17 DIAGNOSIS — C782 Secondary malignant neoplasm of pleura: Secondary | ICD-10-CM | POA: Insufficient documentation

## 2018-09-17 DIAGNOSIS — C7801 Secondary malignant neoplasm of right lung: Secondary | ICD-10-CM

## 2018-09-17 DIAGNOSIS — Z79899 Other long term (current) drug therapy: Secondary | ICD-10-CM | POA: Diagnosis not present

## 2018-09-17 DIAGNOSIS — C7802 Secondary malignant neoplasm of left lung: Secondary | ICD-10-CM | POA: Diagnosis not present

## 2018-09-17 DIAGNOSIS — C7951 Secondary malignant neoplasm of bone: Secondary | ICD-10-CM | POA: Diagnosis not present

## 2018-09-17 DIAGNOSIS — Z170 Estrogen receptor positive status [ER+]: Secondary | ICD-10-CM | POA: Diagnosis not present

## 2018-09-17 DIAGNOSIS — I4891 Unspecified atrial fibrillation: Secondary | ICD-10-CM | POA: Diagnosis not present

## 2018-09-17 DIAGNOSIS — Z7901 Long term (current) use of anticoagulants: Secondary | ICD-10-CM | POA: Diagnosis not present

## 2018-09-17 DIAGNOSIS — C50911 Malignant neoplasm of unspecified site of right female breast: Secondary | ICD-10-CM

## 2018-09-17 LAB — CMP (CANCER CENTER ONLY)
ALT: 54 U/L — ABNORMAL HIGH (ref 0–44)
AST: 85 U/L — ABNORMAL HIGH (ref 15–41)
Albumin: 3.5 g/dL (ref 3.5–5.0)
Alkaline Phosphatase: 192 U/L — ABNORMAL HIGH (ref 38–126)
Anion gap: 9 (ref 5–15)
BUN: 18 mg/dL (ref 8–23)
CO2: 22 mmol/L (ref 22–32)
Calcium: 11.6 mg/dL — ABNORMAL HIGH (ref 8.9–10.3)
Chloride: 110 mmol/L (ref 98–111)
Creatinine: 1.08 mg/dL — ABNORMAL HIGH (ref 0.44–1.00)
GFR, Est AFR Am: 58 mL/min — ABNORMAL LOW (ref >60)
GFR, Estimated: 50 mL/min — ABNORMAL LOW (ref >60)
GLUCOSE: 99 mg/dL (ref 70–99)
Potassium: 3.6 mmol/L (ref 3.5–5.1)
Sodium: 141 mmol/L (ref 135–145)
Total Bilirubin: 0.7 mg/dL (ref 0.3–1.2)
Total Protein: 7.1 g/dL (ref 6.5–8.1)

## 2018-09-17 LAB — CBC WITH DIFFERENTIAL (CANCER CENTER ONLY)
Abs Immature Granulocytes: 0.02 10*3/uL (ref 0.00–0.07)
Basophils Absolute: 0 10*3/uL (ref 0.0–0.1)
Basophils Relative: 1 %
Eosinophils Absolute: 0.2 10*3/uL (ref 0.0–0.5)
Eosinophils Relative: 5 %
HCT: 38.3 % (ref 36.0–46.0)
Hemoglobin: 12.4 g/dL (ref 12.0–15.0)
IMMATURE GRANULOCYTES: 1 %
LYMPHS PCT: 15 %
Lymphs Abs: 0.5 10*3/uL — ABNORMAL LOW (ref 0.7–4.0)
MCH: 31.6 pg (ref 26.0–34.0)
MCHC: 32.4 g/dL (ref 30.0–36.0)
MCV: 97.5 fL (ref 80.0–100.0)
Monocytes Absolute: 0.5 10*3/uL (ref 0.1–1.0)
Monocytes Relative: 16 %
Neutro Abs: 2.1 10*3/uL (ref 1.7–7.7)
Neutrophils Relative %: 62 %
Platelet Count: 187 10*3/uL (ref 150–400)
RBC: 3.93 MIL/uL (ref 3.87–5.11)
RDW: 14 % (ref 11.5–15.5)
WBC Count: 3.3 10*3/uL — ABNORMAL LOW (ref 4.0–10.5)
nRBC: 0 % (ref 0.0–0.2)

## 2018-09-17 LAB — LIPID PANEL
Cholesterol: 131 mg/dL (ref 0–200)
HDL: 46 mg/dL (ref >40)
LDL Cholesterol: 63 mg/dL (ref 0–99)
Total CHOL/HDL Ratio: 2.8 RATIO
Triglycerides: 110 mg/dL (ref <150)
VLDL: 22 mg/dL (ref 0–40)

## 2018-09-17 MED ORDER — DIPHENOXYLATE-ATROPINE 2.5-0.025 MG PO TABS: 1.0000 | ORAL_TABLET | Freq: Four times a day (QID) | ORAL | 1 refills | Status: AC | PRN

## 2018-09-17 MED ORDER — ONDANSETRON HCL 8 MG PO TABS: 8.0000 mg | ORAL_TABLET | Freq: Four times a day (QID) | ORAL | 6 refills | Status: AC | PRN

## 2018-09-17 MED FILL — DIPHENOXYLATE-ATROPINE 2.5-: 2.5-0.025 | 5 days supply | Qty: 30 | Fill #0

## 2018-09-17 MED FILL — ONDANSETRON HCL 8 MG TABLET: 8 | 10 days supply | Qty: 30 | Fill #0

## 2018-09-17 NOTE — Telephone Encounter (Signed)
Patient decline avs and calendar °

## 2018-09-17 NOTE — Assessment & Plan Note (Signed)
Metastatic breast cancer tolungs and bone: Clinically improving onweekly taxol beginning 05-09-16, several delays including for counts, rapid A fib, respiratory infection etc  Treatment summary: 1.Taxol days 1 and 8 every 3 weeks with Xgeva; completed 4cycles from 05/09/2016-08/29/2016 2.LetrozolewithPalbociclib started 10/01/2016 switched to Faslodex with Ibrance 7/16/2019discontinued 07/29/2018  Molecular testing Caris:PDL 1: 0%; AR IHC +90%, ESR 1 mutation ,negative for PI K3 CA mutation, negative for BRCA 1 and 2 mutations ----------------------------------------------------------------------------------------------------------------------------------------------------- Current treatment: Exemestane with everolimus started 07/29/2018 If she progresses on this then we will have to do chemotherapy.  Toxicities: 1.  Constipation 2. Moderate to severe fatigue 3.  Nausea/vomiting  Monitoring glucose and lipid levels. Return to clinic in 2 months for follow-up

## 2018-09-18 ENCOUNTER — Other Ambulatory Visit: Payer: Self-pay | Admitting: Hematology and Oncology

## 2018-09-18 DIAGNOSIS — C50919 Malignant neoplasm of unspecified site of unspecified female breast: Secondary | ICD-10-CM

## 2018-09-18 DIAGNOSIS — C7951 Secondary malignant neoplasm of bone: Principal | ICD-10-CM

## 2018-09-24 MED FILL — LOSARTAN POTASSIUM 50 MG TA: 50 | 30 days supply | Qty: 60 | Fill #2

## 2018-09-24 MED FILL — DILTIAZEM HCL ER COATED BEA: 120 | 30 days supply | Qty: 30 | Fill #3

## 2018-09-25 MED FILL — AFINITOR 2.5 MG TABLET: 2.5 | 28 days supply | Qty: 28 | Fill #0

## 2018-09-25 NOTE — Progress Notes (Signed)
Patient Care Team: [name], MD as PCP - General (Internal Medicine) [name], MD as PCP - Cardiology (Cardiology)  DIAGNOSIS:    ICD-10-CM   1. 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 Findings concerning for liver metastases, numerous pulmonary nodules bilaterally    05/09/2016 - 09/18/2016 Chemotherapy    Taxol weekly initially and later changed her day 1 day 8 every 3 weeks (complicated by A. fib with RVR and prolonged respiratory infection) along with Xgeva    09/19/2016 Procedure    Biopsy of lung mass: Metastatic carcinoma consistent with breast origin, HER-2 negative ratio 1.37    10/01/2016 -  Anti-estrogen oral therapy    LetrozolewithPalbociclib (Ibrance)started 10/01/2016 switched to Faslodex with Ibrance 02/03/2018    06/23/2017 Imaging    CT chest abdomen pelvis: No evidence of progression of metastatic disease, bone metastases stable right-sided pleural  metastases and pleural effusions are stable    06/29/2018 Imaging    Progression of disease in the liver, lung and bones    07/09/2018 Miscellaneous    PDL 1: 0%; AR IHC +90%, ESR 1 mutation ,negative for PI K3 CA mutation, negative for BRCA 1 and 2 mutations     CHIEF COMPLIANT: Follow-up ofeverolimus with exemestane  INTERVAL HISTORY: Lisa [name] is a 78 y.o. with above-mentioned history of metastatic breast cancer currently onIbrancewith Lisa [name]. She presents to the clinic alone today and reports fatigue and difficulty sleeping. She reports constipation and her last BM was Saturday, 4 days ago, for which Imodium was not helpful. She reports one episode of painful urination and has had difficulty drinking more than 4 glasses of water per day. She notes swelling in her ankles. She notes 3 mouth sores since starting treatment that have since resolved. Her labs from today show: WBC 5.8, Hg 12.0, platelets 186, ANC 4.5.   REVIEW OF SYSTEMS:   Constitutional: Denies fevers, chills or abnormal weight loss (+) fatigue Eyes: Denies blurriness of vision Ears, nose, mouth, throat, and face: Denies mucositis or sore throat Respiratory: Denies cough, dyspnea or wheezes Cardiovascular: Denies palpitation, chest discomfort Gastrointestinal: Denies nausea, heartburn (+) constipation  Skin: Denies abnormal skin rashes GYN: (+) painful urination, resolved Lymphatics: Denies new lymphadenopathy or easy bruising Neurological: Denies numbness, tingling or new weaknesses Behavioral/Psych: Mood is stable, no new changes (+) difficulty sleeping Extremities: (+) edema in ankles Breast: denies any pain or lumps or nodules in either breasts  All other systems were reviewed with the patient and are negative.  I have reviewed the past medical history, past surgical history, social history and family history with the patient and they are unchanged from previous note.  ALLERGIES:  is allergic  to diphenhydramine hcl; codeine sulfate; metoprolol; oseltamivir phosphate; oxycodone-acetaminophen; prednisone; and sudafed [pseudoephedrine hcl].  MEDICATIONS:  Current Outpatient Medications  Medication Sig Dispense Refill  . acetaminophen (TYLENOL) 500 MG tablet Take 500 mg by mouth every 6 (six) hours as needed for moderate pain.    [name] AFINITOR 2.5 MG tablet TAKE 1 TABLET (2.5 MG TOTAL) BY MOUTH DAILY. 28 tablet 1  . amiodarone (PACERONE) 200 MG tablet Take 1 tablet (200 mg total) by mouth daily. 90 tablet 1  . CARTIA XT 120 MG 24 hr capsule TAKE 1 CAPSULE BY MOUTH ONCE DAILY (Patient taking differently: Take 120 mg by mouth daily. ) 30 capsule 6  . denosumab (PROLIA) 60 MG/ML SOSY injection Inject 60 mg into the skin every 3 (three) months.    . diphenoxylate-atropine (LOMOTIL) 2.5-0.025 MG tablet Take 1 tablet by mouth 4 (four) times daily as needed for diarrhea or loose stools. 30 tablet 1  . ELIQUIS 5 MG TABS tablet TAKE 1 TABLET BY MOUTH TWICE DAILY 180 tablet 3  . exemestane (AROMASIN) 25 MG tablet Take 1 tablet (25 mg total) by mouth daily after breakfast. 90 tablet 3  . furosemide (LASIX) 40 MG tablet TAKE 1 TABLET (40 MG TOTAL) BY MOUTH DAILY. 90 tablet 2  . loratadine (CLARITIN) 10 MG tablet Take 10 mg by mouth daily.     [name] losartan (COZAAR) 50 MG tablet Take 100 mg daily by mouth.  2  . MECLIZINE HCL PO Take 1 tablet by mouth as needed.    . Multiple Vitamins-Minerals (PRESERVISION AREDS 2+MULTI VIT PO) Take 1 tablet by mouth 2 (two) times daily.     . ondansetron (ZOFRAN) 8 MG tablet Take 1 tablet (8 mg total) by mouth every 6 (six) hours as needed for nausea or vomiting. 30 tablet 6  . Polyethyl Glycol-Propyl Glycol (LUBRICANT EYE DROPS) 0.4-0.3 % SOLN Place 1 drop into both eyes 3 (three) times daily as needed (for dry/irritated eyes.).    [name] Potassium 99 MG TABS Take 198 mg by mouth 2 (two) times daily.     . pravastatin (PRAVACHOL) 40 MG tablet TAKE 1 TABLET BY MOUTH EVERY  EVENING 90 tablet 3  . vitamin B-12 (CYANOCOBALAMIN) 1000 MCG tablet Take 1,000 mcg by mouth 2 (two) times daily.    . vitamin C (ASCORBIC ACID) 500 MG tablet Take 500 mg by mouth daily.       No current facility-administered medications for this visit.     PHYSICAL EXAMINATION: ECOG PERFORMANCE STATUS: 1 - Symptomatic but completely ambulatory  Vitals:   09/29/18 1012  BP: (!) 132/58  Pulse: 78  Resp: 18  Temp: 98.6 F (37 C)  SpO2: 100%   Filed Weights   09/29/18 1012  Weight: 179 lb 6.4 oz (81.4 kg)    GENERAL: alert, no distress and comfortable SKIN: skin color, texture, turgor are normal, no rashes or significant lesions EYES: normal, Conjunctiva are pink and non-injected, sclera clear OROPHARYNX: no exudate, no erythema and lips, buccal mucosa, and tongue normal  NECK: supple, thyroid normal size, non-tender, without nodularity LYMPH: no palpable lymphadenopathy in the cervical, axillary or inguinal LUNGS: clear to auscultation and percussion with normal breathing effort HEART: regular rate & rhythm and no murmurs  and no lower extremity edema ABDOMEN: abdomen soft, non-tender and normal bowel sounds MUSCULOSKELETAL: no cyanosis of digits and no clubbing  NEURO: alert & oriented x 3 with fluent speech, no focal motor/sensory deficits EXTREMITIES: Edema in bilateral ankles.   LABORATORY DATA:  I have reviewed the data as listed CMP Latest Ref Rng & Units 09/17/2018 08/20/2018 07/29/2018  Glucose 70 - 99 mg/dL 99 94 120(H)  BUN 8 - 23 mg/dL 18 21 19   Creatinine 0.44 - 1.00 mg/dL 1.08(H) 1.01(H) 1.00  Sodium 135 - 145 mmol/L 141 139 140  Potassium 3.5 - 5.1 mmol/L 3.6 4.0 3.4(L)  Chloride 98 - 111 mmol/L 110 107 107  CO2 22 - 32 mmol/L 22 22 24   Calcium 8.9 - 10.3 mg/dL 11.6(H) 9.5 8.8(L)  Total Protein 6.5 - 8.1 g/dL 7.1 7.1 6.9  Total Bilirubin 0.3 - 1.2 mg/dL 0.7 0.6 1.0  Alkaline Phos 38 - 126 U/L 192(H) 190(H) 118  AST 15 - 41 U/L 85(H) 155(H) 53(H)  ALT 0 - 44  U/L 54(H) 106(H) 31    Lab Results  Component Value Date   WBC 5.8 09/29/2018   HGB 12.0 09/29/2018   HCT 38.5 09/29/2018   MCV 98.2 09/29/2018   PLT 186 09/29/2018   NEUTROABS 4.5 09/29/2018    ASSESSMENT & PLAN:  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 [facility]) Metastatic breast cancer tolungs and bone: Clinically improving onweekly taxol beginning 05-09-16, several delays including for counts, rapid A fib, respiratory infection etc  Treatment summary: 1.Taxol days 1 and 8 every 3 weeks with Xgeva; completed 4cycles from 05/09/2016-08/29/2016 2.LetrozolewithPalbociclib started 10/01/2016 switched to Faslodex with Ibrance 7/16/2019discontinued 07/29/2018  Molecular testing Caris:PDL 1: 0%; AR IHC +90%, ESR 1 mutation ,negative for PI K3 CA mutation, negative for BRCA 1 and 2 mutations ----------------------------------------------------------------------------------------------------------------------------------------------------- Current treatment: Exemestane with everolimusstarted 07/29/2018 If she progresses on this then we will have to do chemotherapy.  Toxicities: 1.Diarrhea alternating with constipation: Patient will now take MiraLAX for constipation. 2.Moderate to severe fatigue 3.Nausea/vomiting: I instructed her to take Zofran 1 hour prior to taking Afinitor.  She is doing much better with the nausea. I instructed her to drink more liquids.  Monitoring glucose and lipid levels. Return to clinic for follow-up in 1 month with scans    Orders Placed This Encounter  Procedures  . CT Abdomen Pelvis W Contrast    Standing Status:   Future    Standing Expiration Date:   09/29/2019    Order Specific Question:   ** REASON FOR EXAM (FREE TEXT)    Answer:   Metastatic breast cancer restaging on treatment    Order Specific Question:   If indicated for the ordered procedure, I authorize the administration of contrast media per Radiology protocol    Answer:   Yes      Order Specific Question:   Preferred imaging location?    Answer:   [name]    Order Specific Question:   Is Oral Contrast requested for this exam?    Answer:   Yes, Per Radiology protocol    Order Specific Question:   Radiology Contrast Protocol - do NOT remove file path    Answer:   \\[email or web address].pdf  . CT Chest W Contrast    Standing Status:   Future    Standing Expiration Date:   09/29/2019    Order Specific Question:   ** REASON FOR EXAM (FREE TEXT)    Answer:   Met Breast cancer restaging  Order Specific Question:   If indicated for the ordered procedure, I authorize the administration of contrast media per Radiology protocol    Answer:   Yes    Order Specific Question:   Preferred imaging location?    Answer:   [facility]    Order Specific Question:   Radiology Contrast Protocol - do NOT remove file path    Answer:   \\[email or web address]   The patient has a good understanding of the overall plan. she agrees with it. she will call with any problems that may develop before the next visit here.  Nicholas Lose, MD 09/29/2018  Julious Oka Dorshimer am acting as scribe for Dr. Nicholas Lose.  I have reviewed the above documentation for accuracy and completeness, and I agree with the above.

## 2018-09-28 ENCOUNTER — Ambulatory Visit: Payer: Medicare Other

## 2018-09-29 ENCOUNTER — Telehealth: Payer: Self-pay | Admitting: Hematology and Oncology

## 2018-09-29 ENCOUNTER — Other Ambulatory Visit: Payer: Self-pay | Admitting: Hematology and Oncology

## 2018-09-29 ENCOUNTER — Inpatient Hospital Stay: Payer: Medicare Other | Attending: Hematology and Oncology

## 2018-09-29 ENCOUNTER — Inpatient Hospital Stay (HOSPITAL_BASED_OUTPATIENT_CLINIC_OR_DEPARTMENT_OTHER): Payer: Medicare Other | Admitting: Hematology and Oncology

## 2018-09-29 ENCOUNTER — Inpatient Hospital Stay: Payer: Medicare Other

## 2018-09-29 DIAGNOSIS — C787 Secondary malignant neoplasm of liver and intrahepatic bile duct: Secondary | ICD-10-CM | POA: Diagnosis not present

## 2018-09-29 DIAGNOSIS — Z923 Personal history of irradiation: Secondary | ICD-10-CM | POA: Diagnosis not present

## 2018-09-29 DIAGNOSIS — T451X5A Adverse effect of antineoplastic and immunosuppressive drugs, initial encounter: Principal | ICD-10-CM

## 2018-09-29 DIAGNOSIS — C782 Secondary malignant neoplasm of pleura: Secondary | ICD-10-CM | POA: Diagnosis not present

## 2018-09-29 DIAGNOSIS — C50911 Malignant neoplasm of unspecified site of right female breast: Secondary | ICD-10-CM | POA: Insufficient documentation

## 2018-09-29 DIAGNOSIS — D701 Agranulocytosis secondary to cancer chemotherapy: Secondary | ICD-10-CM

## 2018-09-29 DIAGNOSIS — Z170 Estrogen receptor positive status [ER+]: Secondary | ICD-10-CM | POA: Insufficient documentation

## 2018-09-29 DIAGNOSIS — C7951 Secondary malignant neoplasm of bone: Secondary | ICD-10-CM

## 2018-09-29 DIAGNOSIS — C7801 Secondary malignant neoplasm of right lung: Secondary | ICD-10-CM | POA: Insufficient documentation

## 2018-09-29 DIAGNOSIS — C7802 Secondary malignant neoplasm of left lung: Secondary | ICD-10-CM | POA: Diagnosis not present

## 2018-09-29 DIAGNOSIS — Z7901 Long term (current) use of anticoagulants: Secondary | ICD-10-CM | POA: Insufficient documentation

## 2018-09-29 DIAGNOSIS — C50919 Malignant neoplasm of unspecified site of unspecified female breast: Secondary | ICD-10-CM

## 2018-09-29 DIAGNOSIS — Z79899 Other long term (current) drug therapy: Secondary | ICD-10-CM | POA: Diagnosis not present

## 2018-09-29 LAB — CBC WITH DIFFERENTIAL (CANCER CENTER ONLY)
Abs Immature Granulocytes: 0.02 10*3/uL (ref 0.00–0.07)
BASOS PCT: 1 %
Basophils Absolute: 0 10*3/uL (ref 0.0–0.1)
Eosinophils Absolute: 0.1 10*3/uL (ref 0.0–0.5)
Eosinophils Relative: 2 %
HCT: 38.5 % (ref 36.0–46.0)
Hemoglobin: 12 g/dL (ref 12.0–15.0)
Immature Granulocytes: 0 %
Lymphocytes Relative: 9 %
Lymphs Abs: 0.5 10*3/uL — ABNORMAL LOW (ref 0.7–4.0)
MCH: 30.6 pg (ref 26.0–34.0)
MCHC: 31.2 g/dL (ref 30.0–36.0)
MCV: 98.2 fL (ref 80.0–100.0)
Monocytes Absolute: 0.6 10*3/uL (ref 0.1–1.0)
Monocytes Relative: 11 %
NRBC: 0 % (ref 0.0–0.2)
Neutro Abs: 4.5 10*3/uL (ref 1.7–7.7)
Neutrophils Relative %: 77 %
Platelet Count: 186 10*3/uL (ref 150–400)
RBC: 3.92 MIL/uL (ref 3.87–5.11)
RDW: 14.6 % (ref 11.5–15.5)
WBC Count: 5.8 10*3/uL (ref 4.0–10.5)

## 2018-09-29 LAB — CMP (CANCER CENTER ONLY)
ALT: 60 U/L — ABNORMAL HIGH (ref 0–44)
AST: 98 U/L — ABNORMAL HIGH (ref 15–41)
Albumin: 3.3 g/dL — ABNORMAL LOW (ref 3.5–5.0)
Alkaline Phosphatase: 214 U/L — ABNORMAL HIGH (ref 38–126)
Anion gap: 8 (ref 5–15)
BUN: 14 mg/dL (ref 8–23)
CO2: 22 mmol/L (ref 22–32)
Calcium: 10.2 mg/dL (ref 8.9–10.3)
Chloride: 108 mmol/L (ref 98–111)
Creatinine: 1.18 mg/dL — ABNORMAL HIGH (ref 0.44–1.00)
GFR, EST AFRICAN AMERICAN: 52 mL/min — AB (ref >60)
GFR, Estimated: 45 mL/min — ABNORMAL LOW (ref >60)
Glucose, Bld: 109 mg/dL — ABNORMAL HIGH (ref 70–99)
Potassium: 4 mmol/L (ref 3.5–5.1)
Sodium: 138 mmol/L (ref 135–145)
TOTAL PROTEIN: 7.1 g/dL (ref 6.5–8.1)
Total Bilirubin: 0.7 mg/dL (ref 0.3–1.2)

## 2018-09-29 MED ORDER — DENOSUMAB 120 MG/1.7ML ~~LOC~~ SOLN
120.0000 mg | Freq: Once | SUBCUTANEOUS | Status: AC
  Administered 2018-09-29: 120 mg via SUBCUTANEOUS

## 2018-09-29 NOTE — Assessment & Plan Note (Signed)
Metastatic breast cancer tolungs and bone: Clinically improving onweekly taxol beginning 05-09-16, several delays including for counts, rapid A fib, respiratory infection etc  Treatment summary: 1.Taxol days 1 and 8 every 3 weeks with Xgeva; completed 4cycles from 05/09/2016-08/29/2016 2.LetrozolewithPalbociclib started 10/01/2016 switched to Faslodex with Ibrance 7/16/2019discontinued 07/29/2018  Molecular testing Caris:PDL 1: 0%; AR IHC +90%, ESR 1 mutation ,negative for PI K3 CA mutation, negative for BRCA 1 and 2 mutations ----------------------------------------------------------------------------------------------------------------------------------------------------- Current treatment: Exemestane with everolimusstarted 07/29/2018 If she progresses on this then we will have to do chemotherapy.  Toxicities: 1.Diarrhea: I give her a prescription for Lomotil. 2.Moderate to severe fatigue 3.Nausea/vomiting: I instructed her to take Zofran 1 hour prior to taking Afinitor.  Monitoring glucose and lipid levels. Return to clinic for follow-up in 1 month with scans

## 2018-09-29 NOTE — Telephone Encounter (Signed)
Gave avs and calendar ° °

## 2018-09-29 NOTE — Patient Instructions (Signed)

## 2018-09-30 ENCOUNTER — Inpatient Hospital Stay: Payer: Medicare Other

## 2018-09-30 ENCOUNTER — Other Ambulatory Visit: Payer: Self-pay

## 2018-09-30 DIAGNOSIS — R300 Dysuria: Secondary | ICD-10-CM

## 2018-09-30 DIAGNOSIS — C7951 Secondary malignant neoplasm of bone: Secondary | ICD-10-CM | POA: Diagnosis not present

## 2018-09-30 LAB — URINALYSIS, COMPLETE (UACMP) WITH MICROSCOPIC
Bilirubin Urine: NEGATIVE
Glucose, UA: NEGATIVE mg/dL
Ketones, ur: 5 mg/dL — AB
Nitrite: NEGATIVE
PROTEIN: 100 mg/dL — AB
RBC / HPF: >50 RBC/hpf — ABNORMAL HIGH (ref 0–5)
Specific Gravity, Urine: 1.014 (ref 1.005–1.030)
pH: 5 (ref 5.0–8.0)

## 2018-10-01 ENCOUNTER — Other Ambulatory Visit: Payer: Self-pay | Admitting: *Deleted

## 2018-10-01 MED ORDER — LEVOFLOXACIN 500 MG PO TABS: 500.0000 mg | ORAL_TABLET | Freq: Every day | ORAL | 0 refills | Status: DC

## 2018-10-01 MED FILL — levoFLOXacin 500 MG TABS: 500 | 7 days supply | Qty: 7 | Fill #0

## 2018-10-01 NOTE — Progress Notes (Signed)
Reviewed pt urinalysis with Dr. Lindi Adie.  Verbal orders received to order Levaquin 500 mg once a day for 7 days.  Pt notified and verbalized understanding with orders being sent to [facility] out pt pharmacy.

## 2018-10-02 LAB — URINE CULTURE: Culture: >=100000 — AB

## 2018-10-08 MED FILL — ONDANSETRON HCL 8 MG TABLET: 8 | 10 days supply | Qty: 30 | Fill #1

## 2018-10-16 ENCOUNTER — Telehealth: Payer: Self-pay | Admitting: Pharmacist

## 2018-10-16 NOTE — Telephone Encounter (Signed)
Oral Chemotherapy Pharmacist Encounter  Follow-Up Form  Spoke with patient today to follow up regarding patient's oral chemotherapy medication: Afinitor (everolimus) for the treatment of metastatic, hormone-receptor positive breast cancer in conjunction with exemestane, planned duration until disease progression or unacceptable toxicity  Original Start date of oral chemotherapy: 08/01/2018  Pt is doing well today  Pt reports 0 tablets/doses of Afinitor 2.5mg  tablets, 1 tablet by mouth once daily, with water, without regard to food, missed in the last month.   Pt reports the following side effects: patient takes ondansetron daily to prevent nausea. Nausea occurs if she does not do this. Patient manages diarrhea and constipation with OTC measures. Patient c/o persistent fatigue and back pain since surgery performed in 2017, not due to Afinitor.  Pertinent labs reviewed: OK for continued treatment.  Other Issues: CT scan scheduled for 10/30/18. Patient is extremely worried about coming to hospital on that date due to pandemic. I explained to patient that visitors are restricted and this has dramatically reduced foot traffic at [facility]. Patient also informed that many follow-ups for patient not on active treatment are being rescheduled to further reduce foot traffic. Patient instructed to proceed with scheduled CT scan for therapy efficacy assessment. We will contact patient to reschedule if this changes.  Patient knows to call the office with questions or concerns. Oral Oncology Clinic will continue to follow.  Johny Drilling, PharmD, BCPS, BCOP  10/16/2018 2:25 PM Oral Oncology Clinic [phone]

## 2018-10-17 ENCOUNTER — Other Ambulatory Visit: Payer: Self-pay | Admitting: Cardiology

## 2018-10-17 DIAGNOSIS — I5032 Chronic diastolic (congestive) heart failure: Secondary | ICD-10-CM

## 2018-10-20 MED FILL — LOSARTAN POTASSIUM 50 MG TA: 50 | 30 days supply | Qty: 60 | Fill #3

## 2018-10-20 MED FILL — FUROSEMIDE 40 MG TAB: 40 | 90 days supply | Qty: 90 | Fill #0

## 2018-10-20 MED FILL — AFINITOR 2.5 MG TABLET: 2.5 | 28 days supply | Qty: 28 | Fill #1

## 2018-10-20 MED FILL — CARTIA XT 120 MG CP24: 120 | 30 days supply | Qty: 30 | Fill #4

## 2018-10-20 MED FILL — ONDANSETRON HCL 8 MG TABLET: 8 | 10 days supply | Qty: 30 | Fill #2

## 2018-10-23 ENCOUNTER — Encounter: Payer: Self-pay | Admitting: Adult Health

## 2018-10-23 ENCOUNTER — Inpatient Hospital Stay: Payer: Medicare Other | Attending: Hematology and Oncology | Admitting: Adult Health

## 2018-10-23 ENCOUNTER — Telehealth: Payer: Self-pay | Admitting: Emergency Medicine

## 2018-10-23 ENCOUNTER — Other Ambulatory Visit: Payer: Self-pay

## 2018-10-23 ENCOUNTER — Telehealth: Payer: Self-pay | Admitting: *Deleted

## 2018-10-23 ENCOUNTER — Telehealth: Payer: Self-pay | Admitting: Pharmacist

## 2018-10-23 DIAGNOSIS — C7951 Secondary malignant neoplasm of bone: Secondary | ICD-10-CM | POA: Diagnosis not present

## 2018-10-23 DIAGNOSIS — C7802 Secondary malignant neoplasm of left lung: Secondary | ICD-10-CM

## 2018-10-23 DIAGNOSIS — Z87891 Personal history of nicotine dependence: Secondary | ICD-10-CM | POA: Insufficient documentation

## 2018-10-23 DIAGNOSIS — N183 Chronic kidney disease, stage 3 (moderate): Secondary | ICD-10-CM | POA: Insufficient documentation

## 2018-10-23 DIAGNOSIS — I480 Paroxysmal atrial fibrillation: Secondary | ICD-10-CM | POA: Insufficient documentation

## 2018-10-23 DIAGNOSIS — E785 Hyperlipidemia, unspecified: Secondary | ICD-10-CM | POA: Insufficient documentation

## 2018-10-23 DIAGNOSIS — C787 Secondary malignant neoplasm of liver and intrahepatic bile duct: Secondary | ICD-10-CM | POA: Diagnosis not present

## 2018-10-23 DIAGNOSIS — I130 Hypertensive heart and chronic kidney disease with heart failure and stage 1 through stage 4 chronic kidney disease, or unspecified chronic kidney disease: Secondary | ICD-10-CM | POA: Insufficient documentation

## 2018-10-23 DIAGNOSIS — C782 Secondary malignant neoplasm of pleura: Secondary | ICD-10-CM | POA: Insufficient documentation

## 2018-10-23 DIAGNOSIS — I5032 Chronic diastolic (congestive) heart failure: Secondary | ICD-10-CM | POA: Insufficient documentation

## 2018-10-23 DIAGNOSIS — C50911 Malignant neoplasm of unspecified site of right female breast: Secondary | ICD-10-CM | POA: Diagnosis not present

## 2018-10-23 DIAGNOSIS — Z79899 Other long term (current) drug therapy: Secondary | ICD-10-CM | POA: Insufficient documentation

## 2018-10-23 DIAGNOSIS — Z7901 Long term (current) use of anticoagulants: Secondary | ICD-10-CM | POA: Insufficient documentation

## 2018-10-23 DIAGNOSIS — C7800 Secondary malignant neoplasm of unspecified lung: Secondary | ICD-10-CM | POA: Insufficient documentation

## 2018-10-23 DIAGNOSIS — C7801 Secondary malignant neoplasm of right lung: Secondary | ICD-10-CM

## 2018-10-23 MED ORDER — VALACYCLOVIR HCL 1 G PO TABS: 1000.0000 mg | ORAL_TABLET | Freq: Two times a day (BID) | ORAL | 0 refills | Status: DC

## 2018-10-23 MED FILL — valACYclovir HCL 1 GM TABS: 1 | 10 days supply | Qty: 20 | Fill #0

## 2018-10-23 NOTE — Assessment & Plan Note (Addendum)
Metastatic breast cancer tolungs and bone: Clinically improving onweekly taxol beginning 05-09-16, several delays including for counts, rapid A fib, respiratory infection etc  Treatment summary: 1.Taxol days 1 and 8 every 3 weeks with Xgeva; completed 4cycles from 05/09/2016-08/29/2016 2.LetrozolewithPalbociclib started 10/01/2016 switched to Faslodex with Ibrance 7/16/2019discontinued 07/29/2018  Molecular testing Caris:PDL 1: 0%; AR IHC +90%, ESR 1 mutation ,negative for PI K3 CA mutation, negative for BRCA 1 and 2 mutations ----------------------------------------------------------------------------------------------------------------------------------------------------- Current treatment: Exemestane with everolimusstarted 07/29/2018 If she progresses on this then we will have to do chemotherapy.  Skin rash: I facetimed Dr. Lindi Adie who reviewed the above picture and talked with patient.  He notes that there are two possibilities including both shingles or a drug rash.  Since the lesions are on the right side of her face, he would like to treat for shingles with Valtrex 1g PO BID and we will see if they improve, or not.  She will continue Exemestane and Everolimus.  She was instructed to apply cortisone cream to the area.   We will see her back in 10 days for follow up after her CT scan on 4/10.  Should the lesions worsen she will call us.

## 2018-10-23 NOTE — Progress Notes (Signed)
Pt presents with rash around R nostril/along R side nose, down to R side of lips and onto chin.  Denies recent injury, muscle aches, increased fatigue, SOB, CP, trouble breathing or swallowing, fever/chills, or burning/stinging pain around rash.  Reports it has been intermittently itchy.  Hx shingles.  Placed on appropriate isolation precautions, limited contact with others throughout visit.  Pt escorted to exit with belongings.

## 2018-10-23 NOTE — Patient Instructions (Signed)
The rash on your face could be one of two things.   1. Shingles: This is likely because it is only on one side of your face.  I have sent in a script for Valtrex for this.  2. Reaction to the Afinitor: Apply Cortisone cream when needed.    We will evaluate this when we see you next.

## 2018-10-23 NOTE — Progress Notes (Addendum)
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 Findings concerning for liver metastases, numerous pulmonary nodules bilaterally    05/09/2016 - 09/18/2016 Chemotherapy    Taxol weekly initially and later changed her day 1 day 8 every 3 weeks (complicated by A. fib with RVR and prolonged respiratory infection) along with Xgeva    09/19/2016 Procedure    Biopsy of lung mass: Metastatic carcinoma consistent with breast origin, HER-2 negative ratio 1.37    10/01/2016 -  Anti-estrogen oral therapy    LetrozolewithPalbociclib (Ibrance)started 10/01/2016 switched to Faslodex with Ibrance 02/03/2018    06/23/2017 Imaging    CT chest abdomen pelvis: No evidence of progression of metastatic disease, bone metastases stable right-sided pleural metastases and pleural effusions are  stable    06/29/2018 Imaging    Progression of disease in the liver, lung and bones    07/09/2018 Miscellaneous    PDL 1: 0%; AR IHC +90%, ESR 1 mutation ,negative for PI K3 CA mutation, negative for BRCA 1 and 2 mutations    07/2018 Treatment Plan Change    Exemestane/Everolimus      CURRENT THERAPY: Exemestane/Everolimus  INTERVAL HISTORY: Lisa [name] 78 y.o. female returns for urgent evaluation of facial rash that started overnight.  She initially thought it was a fever blisters, but spread into her chin and nose.  It is itching.  Denies pain/burning to area.  She has constipation, and her LBM was this morning.    Patient Active Problem List   Diagnosis Date Noted  . Encounter for monitoring amiodarone therapy 06/04/2017  . Chronic diastolic CHF (congestive heart failure) ([place]) 07/29/2016  . At high risk for falls 07/27/2016  . Chemotherapy induced neutropenia ([place]) 07/06/2016  . Leukopenia due to antineoplastic chemotherapy ([place]) 06/29/2016  . CKD (chronic kidney disease) stage 3, GFR 30-59 ml/min (HCC) 06/14/2016  . Chronic anticoagulation 06/14/2016  . Pericardial effusion - small by TEE 05/27/16 05/28/2016  . Pleural effusion   . Paroxysmal atrial fibrillation (HCC)   . Shortness of breath   . Atrial fibrillation with RVR ([place]) 05/22/2016  . Genetic testing 05/19/2016  . 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 Breast cancer metastasized to bone, right ([place]) 05/07/2016  . Breast cancer metastasized to multiple  sites, right ([place]) 05/07/2016  . Encounter for antineoplastic chemotherapy 03/06/2016  . Breast cancer metastasized to multiple sites ([place]) 12/09/2015  . Urinary frequency 11/10/2015  . High risk medication use 11/10/2015  . Postoperative anemia due to acute blood loss 09/14/2015  . Metastatic breast cancer ([place]) 09/01/2015  . Cord compression ([place])  08/31/2015  . Pathologic compression fracture of thoracic vertebra ([place]) 08/31/2015  . Pathologic fracture of thoracic vertebrae 08/26/2015  . C. difficile colitis 08/12/2015  . Acute kidney injury superimposed on CKD ([place]) 08/12/2015  . Unintentional weight loss 08/12/2015  . Cancer associated pain 08/12/2015  . Hypokalemia 08/08/2015  . Carcinoma of breast metastatic to bone ([place]) 08/07/2015  . Enteritis due to Clostridium difficile 08/06/2015  . Sepsis ([place]) 08/02/2015  . Diarrhea 08/02/2015  . AKI (acute kidney injury) ([place]) 08/02/2015  . Acute kidney injury ([place])   . Bone metastases ([place])   . 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 PLEURAL EFFUSION 10/13/2008  . ACUTE BRONCHOSPASM 06/27/2008  . Disorder of bone and cartilage 02/03/2008  . UNS ADVRS EFF UNS RX MEDICINAL&BIOLOGICAL SBSTNC 01/04/2008  . HLD (hyperlipidemia) 07/24/2007  . Essential hypertension 07/24/2007  . Allergic rhinitis 07/24/2007    is allergic to diphenhydramine hcl; codeine sulfate; metoprolol; oseltamivir phosphate; oxycodone-acetaminophen; prednisone; and sudafed [pseudoephedrine hcl].  MEDICAL HISTORY: Past Medical History:  Diagnosis Date  . Breast cancer ([place]) 07-13-1997   right  . Chronic back pain   . CKD (chronic kidney disease), stage III ([place])   . Complication of anesthesia   . Coronary artery calcification of native artery    a. seen on PET scan 04/2016.  [name] Hypertension   . Pericardial effusion    a. small by TEE 05/2016.  [name] Persistent atrial fibrillation   . Pleural effusion 11-08-2008  . PONV (postoperative nausea and vomiting)   . Radiation 08/17/2015-08/29/2015   lower thoracic spine 22.5 gray  . Radiation 01/17/16-01/24/16   right femur 20Gy    SURGICAL HISTORY: Past Surgical History:  Procedure Laterality Date  . ABDOMINAL HYSTERECTOMY  02-21-1982  . BREAST BIOPSY  07-13-1997  . CARDIOVERSION N/A 05/27/2016   Procedure: CARDIOVERSION;  Surgeon: [name],  MD;  Location: [facility] ENDOSCOPY;  Service: Cardiovascular;  Laterality: N/A;  . CARDIOVERSION N/A 05/22/2018   Procedure: CARDIOVERSION;  Surgeon: Josue Hector, MD;  Location: [facility] ENDOSCOPY;  Service: Cardiovascular;  Laterality: N/A;  . CARDIOVERSION N/A 06/26/2018   Procedure: CARDIOVERSION;  Surgeon: Dorothy Spark, MD;  Location: [facility];  Service: Cardiovascular;  Laterality: N/A;  . DILATION AND CURETTAGE, DIAGNOSTIC / THERAPEUTIC  12-13-1981  . MASTECTOMY  07-29-1997  . PELVIC LAPAROSCOPY  11-06-1981  . POSTERIOR LUMBAR FUSION 4 LEVEL N/A 09/01/2015   Procedure: Thoracic six-Lumbar one fusion with arthrodesis pedicle screw stabilization and decompression;  Surgeon: Ashok Pall, MD;  Location: [facility];  Service: Neurosurgery;  Laterality: N/A;  T6-L1 fusion with arthrodesis pedicle screw stabilization and decompression  . TEE WITHOUT CARDIOVERSION N/A 05/27/2016   Procedure: TRANSESOPHAGEAL ECHOCARDIOGRAM (TEE);  Surgeon: [name], MD;  Location: [facility] ENDOSCOPY;  Service: Cardiovascular;  Laterality: N/A;  . TEE WITHOUT CARDIOVERSION N/A 06/26/2018   Procedure: TRANSESOPHAGEAL ECHOCARDIOGRAM (TEE);  Surgeon: Dorothy Spark, MD;  Location: [facility] ENDOSCOPY;  Service: Cardiovascular;  Laterality: N/A;  . THORACENTESIS  10-18-2008    SOCIAL HISTORY: Social History   Socioeconomic History  . Marital status: Single    Spouse name: Not on file  . Number of children: 0  .  Years of education: Not on file  . Highest education level: Not on file  Occupational History  . Occupation: Firefighter for [employer]  . Financial resource strain: Not on file  . Food insecurity:    Worry: Not on file    Inability: Not on file  . Transportation needs:    Medical: Not on file    Non-medical: Not on file  Tobacco Use  . Smoking status: Former Smoker    Packs/day: 0.50    Years: 30.00    Pack years: 15.00    Types: Cigarettes    Last attempt to quit:  07/22/1996    Years since quitting: 22.2  . Smokeless tobacco: Never Used  . Tobacco comment: 1/2 up to 1 ppd  Substance and Sexual Activity  . Alcohol use: No    Alcohol/week: 0.0 standard drinks    Comment: hx of alcohol socially - hasn't had drink since March 1998  . Drug use: No  . Sexual activity: Not on file  Lifestyle  . Physical activity:    Days per week: Not on file    Minutes per session: Not on file  . Stress: Not on file  Relationships  . Social connections:    Talks on phone: Not on file    Gets together: Not on file    Attends religious service: Not on file    Active member of club or organization: Not on file    Attends meetings of clubs or organizations: Not on file    Relationship status: Not on file  . Intimate partner violence:    Fear of current or ex partner: Not on file    Emotionally abused: Not on file    Physically abused: Not on file    Forced sexual activity: Not on file  Other Topics Concern  . Not on file  Social History Narrative  . Not on file    FAMILY HISTORY: Family History  Problem Relation Age of Onset  . Heart disease Mother        with pacemaker   . Asthma Father   . Heart attack Maternal Grandfather 64  . Diabetes Paternal Grandmother   . Aortic stenosis Maternal Uncle   . Prostate cancer Maternal Uncle        dx unspecified age  . Prostate cancer Maternal Uncle        dx older than 50y  . Prostate cancer Maternal Uncle        dx older than 50y  . Prostate cancer Maternal Uncle 72       s/p prostatectomy  . Breast cancer Cousin 17       maternal 1st cousin; s/p lumpectomy and radiation  . Breast cancer Cousin        maternal 1st cousin dx mid-late 56s; s/p mastectomy    Review of Systems  Constitutional: Negative for appetite change, chills, fatigue, fever and unexpected weight change.  HENT:   Negative for hearing loss, lump/mass and trouble swallowing.   Eyes: Negative for eye problems.  Respiratory: Negative for  chest tightness, cough and shortness of breath.   Cardiovascular: Negative for chest pain, leg swelling and palpitations.  Gastrointestinal: Negative for abdominal distention, abdominal pain, constipation, diarrhea, nausea and vomiting.  Endocrine: Negative for hot flashes.  Musculoskeletal: Negative for arthralgias.  Skin: Positive for itching and rash.  Neurological: Negative for dizziness and numbness.  Hematological: Negative for adenopathy. Does not bruise/bleed easily.  Psychiatric/Behavioral: Negative for  depression. The patient is not nervous/anxious.       PHYSICAL EXAMINATION  ECOG PERFORMANCE STATUS: 1 - Symptomatic but completely ambulatory  Vitals:   10/23/18 1146  BP: (!) 127/51  Pulse: 75  Resp: 18  Temp: 97.8 F (36.6 C)  SpO2: 98%    Physical Exam Constitutional:      General: She is not in acute distress.    Appearance: Normal appearance.  HENT:     Head: Normocephalic and atraumatic.     Mouth/Throat:     Mouth: Mucous membranes are moist.     Pharynx: Oropharynx is clear. No oropharyngeal exudate or posterior oropharyngeal erythema.  Eyes:     General: No scleral icterus.    Pupils: Pupils are equal, round, and reactive to light.  Neck:     Musculoskeletal: Neck supple.  Cardiovascular:     Rate and Rhythm: Normal rate and regular rhythm.     Pulses: Normal pulses.     Heart sounds: Normal heart sounds.  Pulmonary:     Effort: Pulmonary effort is normal.     Breath sounds: Normal breath sounds. No wheezing.  Abdominal:     General: Abdomen is flat. Bowel sounds are normal. There is no distension.     Palpations: Abdomen is soft.     Tenderness: There is no abdominal tenderness.  Musculoskeletal:        General: No swelling.  Lymphadenopathy:     Cervical: No cervical adenopathy.  Skin:    General: Skin is warm and dry.     Comments: Rash on right side of face, see picture below   Neurological:     General: No focal deficit present.      Mental Status: She is alert.  Psychiatric:        Mood and Affect: Mood normal.        Behavior: Behavior normal.   Facial rash on 10/23/2018       LABORATORY DATA:  CBC    Component Value Date/Time   WBC 5.8 09/29/2018 0953   WBC 2.0 (L) 06/16/2018 1609   RBC 3.92 09/29/2018 0953   HGB 12.0 09/29/2018 0953   HGB 12.0 06/23/2017 0930   HCT 38.5 09/29/2018 0953   HCT 35.0 06/23/2017 0930   PLT 186 09/29/2018 0953   PLT 182 06/23/2017 0930   MCV 98.2 09/29/2018 0953   MCV 103.3 (H) 06/23/2017 0930   MCH 30.6 09/29/2018 0953   MCHC 31.2 09/29/2018 0953   RDW 14.6 09/29/2018 0953   RDW 15.1 (H) 06/23/2017 0930   LYMPHSABS 0.5 (L) 09/29/2018 0953   LYMPHSABS 0.4 (L) 06/23/2017 0930   MONOABS 0.6 09/29/2018 0953   MONOABS 0.3 06/23/2017 0930   EOSABS 0.1 09/29/2018 0953   EOSABS 0.1 06/23/2017 0930   BASOSABS 0.0 09/29/2018 0953   BASOSABS 0.1 06/23/2017 0930    CMP     Component Value Date/Time   NA 138 09/29/2018 0953   NA 141 04/20/2018 1222   NA 139 06/23/2017 0930   K 4.0 09/29/2018 0953   K 3.9 06/23/2017 0930   CL 108 09/29/2018 0953   CL 108 (H) 01/13/2013 0806   CO2 22 09/29/2018 0953   CO2 21 (L) 06/23/2017 0930   GLUCOSE 109 (H) 09/29/2018 0953   GLUCOSE 82 06/23/2017 0930   GLUCOSE 101 (H) 01/13/2013 0806   BUN 14 09/29/2018 0953   BUN 18 04/20/2018 1222   BUN 22.2 06/23/2017 0930   CREATININE 1.18 (H) 09/29/2018  0017   CREATININE 1.0 06/23/2017 0930   CALCIUM 10.2 09/29/2018 0953   CALCIUM 9.1 06/23/2017 0930   PROT 7.1 09/29/2018 0953   PROT 7.1 06/23/2017 0930   ALBUMIN 3.3 (L) 09/29/2018 0953   ALBUMIN 3.9 06/23/2017 0930   AST 98 (H) 09/29/2018 0953   AST 22 06/23/2017 0930   ALT 60 (H) 09/29/2018 0953   ALT 13 06/23/2017 0930   ALKPHOS 214 (H) 09/29/2018 0953   ALKPHOS 74 06/23/2017 0930   BILITOT 0.7 09/29/2018 0953   BILITOT 0.69 06/23/2017 0930   GFRNONAA 45 (L) 09/29/2018 0953   GFRAA 52 (L) 09/29/2018 4944       PENDING  LABS:   RADIOGRAPHIC STUDIES:  No results found.   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 [facility]) Metastatic breast cancer tolungs and bone: Clinically improving onweekly taxol beginning 05-09-16, several delays including for counts, rapid A fib, respiratory infection etc  Treatment summary: 1.Taxol days 1 and 8 every 3 weeks with Xgeva; completed 4cycles from 05/09/2016-08/29/2016 2.LetrozolewithPalbociclib started 10/01/2016 switched to Faslodex with Ibrance 7/16/2019discontinued 07/29/2018  Molecular testing [place]:PDL 1: 0%; AR IHC +90%, ESR 1 mutation ,negative for PI K3 CA mutation, negative for BRCA 1 and 2 mutations ----------------------------------------------------------------------------------------------------------------------------------------------------- Current treatment: Exemestane with everolimusstarted 07/29/2018 If she progresses on this then we will have to do chemotherapy.  Skin rash: I facetimed Dr. Lindi Adie who reviewed the above picture and talked with patient.  He notes that there are two possibilities including both shingles or a drug rash.  Since the lesions are on the right side of her face, he would like to treat for shingles with Valtrex 1g PO BID and we will see if they improve, or not.  She will continue Exemestane and Everolimus.  She was instructed to apply cortisone cream to the area.   We will see her back in 10 days for follow up after her CT scan on 4/10.  Should the lesions worsen she will call us.       All questions were answered. The patient knows to call the clinic with any problems, questions or concerns. We can certainly see the patient much sooner if necessary.   This note was electronically signed. Scot Dock, NP 10/23/2018   Attending Note  I personally saw and examined Lisa [name]. The plan of care was discussed with her. I agree with the assessment and plan as  documented above. I believe the rash in the face of shingles.  We sent a prescription for Valtrex. If it does not get better then we have to attributed to a drug reaction.  Signed Harriette Ohara, MD

## 2018-10-23 NOTE — Telephone Encounter (Signed)
Returned missed call to pt.  Pt states she believes she has a "fever blister" under the right nostril.  After assess pt over the phone, pt states she has blisters under her right nostril only, she also states she has a rash under the right nostril as well and a rash on the right side of her chin only as well.  Pt instructed that she would need to be seen in the symptom management clinic today by Mendel Ryder, NP.  Pt verbalized understanding and message sent to scheduling to schedule apt asap and to let pt be aware of the time.

## 2018-10-23 NOTE — Telephone Encounter (Signed)
Oral Oncology Pharmacist Encounter  Received voicemail from patient with complaints of fever blister occurring at the base of nostril.   She states this is a problem that has occurred before, however, it is been greater than 10 years since last outbreak.   Message states that previously Valtrex has been effective in clearing up the issue.  She requests call back to discuss treatment options.  Voicemail forwarded to Dr. Geralyn Flash collaborative practice RN.  I returned call to patient and informed her that message had been forwarded to MD.  I do not have prescribing rights and unable to write prescription for Valtrex, if that is what is to be prescribed. Patient states fever blister started to occur 48 hours ago and has slightly progressed. She states that she has been using her Abreva which has been minimally effective.  Patient informed that the [name] long outpatient pharmacy is still allowing patients to pick up same-day prescriptions and that I can help coordinate acquisition of the Valtrex if a prescription is sent to that pharmacy for her to be able to pick it up today.  All questions answered. Patient knows to call the office with any additional questions or concerns.  Johny Drilling, PharmD, BCPS, BCOP  10/23/2018 8:21 AM Oral Oncology Clinic [phone]

## 2018-10-23 NOTE — Telephone Encounter (Signed)
Called pt back asking if she wanted to be seen sooner by NP Mendel Ryder for [facility] visit to evaluate facial rash, pt agreed and stated "I'm on my way".  NP Mendel Ryder aware.

## 2018-10-23 NOTE — Progress Notes (Signed)
Facial rash [id number]

## 2018-10-30 ENCOUNTER — Encounter (HOSPITAL_COMMUNITY): Payer: Self-pay

## 2018-10-30 ENCOUNTER — Ambulatory Visit (HOSPITAL_COMMUNITY)
Admission: RE | Admit: 2018-10-30 | Discharge: 2018-10-30 | Disposition: A | Discharge status: Home / Self Care | Payer: Medicare Other | Source: Ambulatory Visit | Attending: Hematology and Oncology | Admitting: Hematology and Oncology

## 2018-10-30 ENCOUNTER — Inpatient Hospital Stay: Payer: Medicare Other

## 2018-10-30 ENCOUNTER — Other Ambulatory Visit: Payer: Self-pay

## 2018-10-30 DIAGNOSIS — C50911 Malignant neoplasm of unspecified site of right female breast: Secondary | ICD-10-CM | POA: Diagnosis present

## 2018-10-30 DIAGNOSIS — I130 Hypertensive heart and chronic kidney disease with heart failure and stage 1 through stage 4 chronic kidney disease, or unspecified chronic kidney disease: Secondary | ICD-10-CM | POA: Diagnosis not present

## 2018-10-30 DIAGNOSIS — Z7901 Long term (current) use of anticoagulants: Secondary | ICD-10-CM | POA: Diagnosis not present

## 2018-10-30 DIAGNOSIS — I5032 Chronic diastolic (congestive) heart failure: Secondary | ICD-10-CM | POA: Diagnosis not present

## 2018-10-30 DIAGNOSIS — E785 Hyperlipidemia, unspecified: Secondary | ICD-10-CM | POA: Diagnosis not present

## 2018-10-30 DIAGNOSIS — C7951 Secondary malignant neoplasm of bone: Secondary | ICD-10-CM | POA: Diagnosis not present

## 2018-10-30 DIAGNOSIS — C782 Secondary malignant neoplasm of pleura: Principal | ICD-10-CM

## 2018-10-30 DIAGNOSIS — C7800 Secondary malignant neoplasm of unspecified lung: Secondary | ICD-10-CM | POA: Diagnosis not present

## 2018-10-30 DIAGNOSIS — N183 Chronic kidney disease, stage 3 (moderate): Secondary | ICD-10-CM | POA: Diagnosis not present

## 2018-10-30 DIAGNOSIS — Z87891 Personal history of nicotine dependence: Secondary | ICD-10-CM | POA: Diagnosis not present

## 2018-10-30 DIAGNOSIS — I480 Paroxysmal atrial fibrillation: Secondary | ICD-10-CM | POA: Diagnosis not present

## 2018-10-30 DIAGNOSIS — Z79899 Other long term (current) drug therapy: Secondary | ICD-10-CM | POA: Diagnosis not present

## 2018-10-30 LAB — CMP (CANCER CENTER ONLY)
ALT: 46 U/L — ABNORMAL HIGH (ref 0–44)
AST: 125 U/L — ABNORMAL HIGH (ref 15–41)
Albumin: 3.2 g/dL — ABNORMAL LOW (ref 3.5–5.0)
Alkaline Phosphatase: 271 U/L — ABNORMAL HIGH (ref 38–126)
Anion gap: 10 (ref 5–15)
BUN: 14 mg/dL (ref 8–23)
CO2: 19 mmol/L — ABNORMAL LOW (ref 22–32)
Calcium: 11.1 mg/dL — ABNORMAL HIGH (ref 8.9–10.3)
Chloride: 110 mmol/L (ref 98–111)
Creatinine: 1.24 mg/dL — ABNORMAL HIGH (ref 0.44–1.00)
GFR, Est AFR Am: 49 mL/min — ABNORMAL LOW (ref >60)
GFR, Estimated: 42 mL/min — ABNORMAL LOW (ref >60)
Glucose, Bld: 99 mg/dL (ref 70–99)
Potassium: 4.7 mmol/L (ref 3.5–5.1)
Sodium: 139 mmol/L (ref 135–145)
Total Bilirubin: 0.6 mg/dL (ref 0.3–1.2)
Total Protein: 6.9 g/dL (ref 6.5–8.1)

## 2018-10-30 LAB — CBC WITH DIFFERENTIAL (CANCER CENTER ONLY)
Abs Immature Granulocytes: 0.02 10*3/uL (ref 0.00–0.07)
Basophils Absolute: 0 10*3/uL (ref 0.0–0.1)
Basophils Relative: 1 %
Eosinophils Absolute: 0.1 10*3/uL (ref 0.0–0.5)
Eosinophils Relative: 1 %
HCT: 38.4 % (ref 36.0–46.0)
Hemoglobin: 12 g/dL (ref 12.0–15.0)
Immature Granulocytes: 1 %
Lymphocytes Relative: 15 %
Lymphs Abs: 0.6 10*3/uL — ABNORMAL LOW (ref 0.7–4.0)
MCH: 29 pg (ref 26.0–34.0)
MCHC: 31.3 g/dL (ref 30.0–36.0)
MCV: 92.8 fL (ref 80.0–100.0)
Monocytes Absolute: 0.8 10*3/uL (ref 0.1–1.0)
Monocytes Relative: 19 %
Neutro Abs: 2.8 10*3/uL (ref 1.7–7.7)
Neutrophils Relative %: 63 %
Platelet Count: 209 10*3/uL (ref 150–400)
RBC: 4.14 MIL/uL (ref 3.87–5.11)
RDW: 15.3 % (ref 11.5–15.5)
WBC Count: 4.3 10*3/uL (ref 4.0–10.5)
nRBC: 0 % (ref 0.0–0.2)

## 2018-10-30 MED ORDER — IOHEXOL 300 MG/ML  SOLN
100.0000 mL | Freq: Once | INTRAMUSCULAR | Status: AC | PRN
  Administered 2018-10-30: 10:00:00 80 mL via INTRAVENOUS

## 2018-10-30 MED ORDER — SODIUM CHLORIDE (PF) 0.9 % IJ SOLN
INTRAMUSCULAR | Status: AC
  Filled 2018-10-30: qty 50

## 2018-11-02 NOTE — Progress Notes (Signed)
Patient Care Team: [name], MD as PCP - General (Internal Medicine) [name], MD as PCP - Cardiology (Cardiology)  DIAGNOSIS:    ICD-10-CM   1. 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 Findings concerning for liver metastases, numerous pulmonary nodules bilaterally    05/09/2016 - 09/18/2016 Chemotherapy    Taxol weekly initially and later changed her day 1 day 8 every 3 weeks (complicated by A. fib with RVR and prolonged respiratory infection) along with Xgeva    09/19/2016 Procedure    Biopsy of lung mass: Metastatic carcinoma consistent with breast origin, HER-2 negative ratio 1.37    10/01/2016 -  Anti-estrogen oral therapy    LetrozolewithPalbociclib (Ibrance)started 10/01/2016 switched to Faslodex with Ibrance 02/03/2018    06/23/2017 Imaging    CT chest abdomen pelvis: No evidence of progression of metastatic disease, bone metastases stable right-sided pleural metastases and  pleural effusions are stable    06/29/2018 Imaging    Progression of disease in the liver, lung and bones    07/09/2018 Miscellaneous    PDL 1: 0%; AR IHC +90%, ESR 1 mutation ,negative for PI K3 CA mutation, negative for BRCA 1 and 2 mutations    07/2018 Treatment Plan Change    Exemestane/Everolimus    10/30/2018 Imaging    Stable widespread osseous metastates and significant progression of hepatic metastases.      CHIEF COMPLIANT: Follow-up ofeverolimus with exemestane and facial rash  INTERVAL HISTORY: Lisa [name] is a 78 y.o. with above-mentioned history of metastatic breast cancer currently onIbrancewith Deetta Perla. A CT chest/abdomen/pelvis from 10/30/18 showed stable widespread bone metastases and progression of hepatic metastatic lesions. She presents to the clinic alone today to discuss the scan results and for follow-up of her facial rash.  The facial rash has resolved.  She finished a prescription for Valtrex.  Patient tells me that she has been feeling extremely poorly.  Her legs are markedly swollen and do not seem to get better with Lasix.  She does not have any energy to do any activities.  She does not feel well at all.  REVIEW OF SYSTEMS:   Constitutional: Generalized weakness Eyes: Denies blurriness of vision Ears, nose, mouth, throat, and face: Denies mucositis or sore throat Respiratory: Denies cough, dyspnea or wheezes Cardiovascular: Denies palpitation, chest discomfort Gastrointestinal: Denies nausea, heartburn or change in bowel habits Skin: Denies abnormal skin rashes Lymphatics: Denies new lymphadenopathy or easy bruising Neurological: Denies numbness, tingling  or new weaknesses Behavioral/Psych: Mood is stable, no new changes  Extremities: Bilateral 3+ lower extremity edema Breast: denies any pain or lumps or nodules in either breasts All other systems were reviewed with the patient and are negative.  I have reviewed the past medical  history, past surgical history, social history and family history with the patient and they are unchanged from previous note.  ALLERGIES:  is allergic to diphenhydramine hcl; codeine sulfate; metoprolol; oseltamivir phosphate; oxycodone-acetaminophen; prednisone; and sudafed [pseudoephedrine hcl].  MEDICATIONS:  Current Outpatient Medications  Medication Sig Dispense Refill  . acetaminophen (TYLENOL) 500 MG tablet Take 500 mg by mouth every 6 (six) hours as needed for moderate pain.    [name] amiodarone (PACERONE) 200 MG tablet Take 1 tablet (200 mg total) by mouth daily. 90 tablet 1  . CARTIA XT 120 MG 24 hr capsule TAKE 1 CAPSULE BY MOUTH ONCE DAILY (Patient taking differently: Take 120 mg by mouth daily. ) 30 capsule 6  . denosumab (PROLIA) 60 MG/ML SOSY injection Inject 60 mg into the skin every 3 (three) months.    . diphenoxylate-atropine (LOMOTIL) 2.5-0.025 MG tablet Take 1 tablet by mouth 4 (four) times daily as needed for diarrhea or loose stools. 30 tablet 1  . ELIQUIS 5 MG TABS tablet TAKE 1 TABLET BY MOUTH TWICE DAILY 180 tablet 3  . furosemide (LASIX) 40 MG tablet TAKE 1 TABLET BY MOUTH DAILY. 90 tablet 2  . loratadine (CLARITIN) 10 MG tablet Take 10 mg by mouth daily.     [name] losartan (COZAAR) 50 MG tablet Take 100 mg daily by mouth.  2  . MECLIZINE HCL PO Take 1 tablet by mouth as needed.    . Multiple Vitamins-Minerals (PRESERVISION AREDS 2+MULTI VIT PO) Take 1 tablet by mouth 2 (two) times daily.     . ondansetron (ZOFRAN) 8 MG tablet Take 1 tablet (8 mg total) by mouth every 6 (six) hours as needed for nausea or vomiting. 30 tablet 6  . Polyethyl Glycol-Propyl Glycol (LUBRICANT EYE DROPS) 0.4-0.3 % SOLN Place 1 drop into both eyes 3 (three) times daily as needed (for dry/irritated eyes.).    [name] Potassium 99 MG TABS Take 198 mg by mouth 2 (two) times daily.     . pravastatin (PRAVACHOL) 40 MG tablet TAKE 1 TABLET BY MOUTH EVERY EVENING 90 tablet 3  . vitamin C (ASCORBIC ACID) 500 MG  tablet Take 500 mg by mouth daily.       No current facility-administered medications for this visit.     PHYSICAL EXAMINATION: ECOG PERFORMANCE STATUS: 2 - Symptomatic, <50% confined to bed  Vitals:   11/03/18 1010  BP: (!) 127/54  Pulse: 72  Resp: 17  Temp: 97.6 F (36.4 C)  SpO2: 100%   Filed Weights   11/03/18 1010  Weight: 182 lb 6.4 oz (82.7 kg)    GENERAL: alert, no distress and comfortable SKIN: skin color, texture, turgor are normal, no rashes or significant lesions EYES: normal, Conjunctiva are pink and non-injected, sclera clear OROPHARYNX: no exudate, no erythema and lips, buccal mucosa, and tongue normal  NECK: supple, thyroid normal size, non-tender, without nodularity LYMPH: no palpable lymphadenopathy in the cervical, axillary or inguinal LUNGS: clear to auscultation and percussion with normal breathing effort HEART: regular rate & rhythm and no murmurs and no lower extremity edema ABDOMEN: abdomen soft, non-tender and normal bowel sounds MUSCULOSKELETAL: no cyanosis of digits and no clubbing  NEURO: alert & oriented x 3 with fluent speech, no  focal motor/sensory deficits EXTREMITIES: No lower extremity edema  LABORATORY DATA:  I have reviewed the data as listed CMP Latest Ref Rng & Units 10/30/2018 09/29/2018 09/17/2018  Glucose 70 - 99 mg/dL 99 109(H) 99  BUN 8 - 23 mg/dL 14 14 18   Creatinine 0.44 - 1.00 mg/dL 1.24(H) 1.18(H) 1.08(H)  Sodium 135 - 145 mmol/L 139 138 141  Potassium 3.5 - 5.1 mmol/L 4.7 4.0 3.6  Chloride 98 - 111 mmol/L 110 108 110  CO2 22 - 32 mmol/L 19(L) 22 22  Calcium 8.9 - 10.3 mg/dL 11.1(H) 10.2 11.6(H)  Total Protein 6.5 - 8.1 g/dL 6.9 7.1 7.1  Total Bilirubin 0.3 - 1.2 mg/dL 0.6 0.7 0.7  Alkaline Phos 38 - 126 U/L 271(H) 214(H) 192(H)  AST 15 - 41 U/L 125(H) 98(H) 85(H)  ALT 0 - 44 U/L 46(H) 60(H) 54(H)    Lab Results  Component Value Date   WBC 4.3 10/30/2018   HGB 12.0 10/30/2018   HCT 38.4 10/30/2018   MCV 92.8  10/30/2018   PLT 209 10/30/2018   NEUTROABS 2.8 10/30/2018    ASSESSMENT & PLAN:  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 [facility]) Metastatic breast cancer tolungs and bone: Clinically improving onweekly taxol beginning 05-09-16, several delays including for counts, rapid A fib, respiratory infection etc  Treatment summary: 1.Taxol days 1 and 8 every 3 weeks with Xgeva; completed 4cycles from 05/09/2016-08/29/2016 2.LetrozolewithPalbociclib started 10/01/2016 switched to Faslodex with Ibrance 7/16/2019discontinued 07/29/2018  Molecular testing Caris:PDL 1: 0%; AR IHC +90%, ESR 1 mutation ,negative for PI K3 CA mutation, negative for BRCA 1 and 2 mutations ----------------------------------------------------------------------------------------------------------------------------------------------------- Current treatment: Exemestane with everolimusstarted 07/29/2018   Skin rash: Suspicious for shingles.  We gave her a prescription for Valtrex.  CT chest abdomen pelvis 10/30/2018: Worsening hepatic metastatic disease.  Index lesion 7.8 x 5 cm formally 4 x 2.9 cm, right hepatic lobe 9.8 x 8.1 cm formerly 7.9 x 4.5 cm; resolution of right pleural effusion, stable widespread bone metastases in the pelvis and lumbar spine.  Radiology review: Based upon the above CT scan results showing progression of the disease, I recommended systemic chemotherapy.  Options discussed include Xgeva versus intravenous chemo with carboplatin and gemcitabine.  Goal of treatment: Palliation I discussed with her about goals of care and the poor prognosis that patients have when they have extensive liver involvement. Plan: 1.  Hold off on all further systemic treatments 2.  In 2 weeks I will have a telephone visit with her. 3.  For the severe lower extremity edema, we will increase Lasix to 40 twice daily.  Based on all the risks and benefits, I recommended hospice care for her. She will think about all these  options and will inform us of her final decision in 2 weeks when I discussed with her. We will have hospice talk to her and provide her with a different options.  Patient does not have much social support and she has been living independently all by herself but I worry that any further systemic treatment would only make her performance status worsened without much benefit.  telephone visit in 2 weeks.    Orders Placed This Encounter  Procedures  . Ambulatory referral to Hospice    Referral Priority:   Routine    Referral Type:   Consultation    Referral Reason:   Specialty Services Required    Requested Specialty:   Hospice Services    Number of Visits Requested:   1   The patient  has a good understanding of the overall plan. she agrees with it. she will call with any problems that may develop before the next visit here.  Nicholas Lose, MD 11/03/2018  Lisa [name] am acting as scribe for Dr. Nicholas Lose.  I have reviewed the above documentation for accuracy and completeness, and I agree with the above.

## 2018-11-03 ENCOUNTER — Inpatient Hospital Stay (HOSPITAL_BASED_OUTPATIENT_CLINIC_OR_DEPARTMENT_OTHER): Payer: Medicare Other | Admitting: Hematology and Oncology

## 2018-11-03 ENCOUNTER — Encounter: Payer: Self-pay | Admitting: *Deleted

## 2018-11-03 ENCOUNTER — Other Ambulatory Visit: Payer: Self-pay

## 2018-11-03 DIAGNOSIS — C50911 Malignant neoplasm of unspecified site of right female breast: Secondary | ICD-10-CM | POA: Diagnosis not present

## 2018-11-03 DIAGNOSIS — Z79811 Long term (current) use of aromatase inhibitors: Secondary | ICD-10-CM

## 2018-11-03 DIAGNOSIS — C7801 Secondary malignant neoplasm of right lung: Secondary | ICD-10-CM | POA: Diagnosis not present

## 2018-11-03 DIAGNOSIS — R600 Localized edema: Secondary | ICD-10-CM

## 2018-11-03 DIAGNOSIS — C782 Secondary malignant neoplasm of pleura: Secondary | ICD-10-CM | POA: Diagnosis not present

## 2018-11-03 DIAGNOSIS — R21 Rash and other nonspecific skin eruption: Secondary | ICD-10-CM

## 2018-11-03 DIAGNOSIS — C7951 Secondary malignant neoplasm of bone: Secondary | ICD-10-CM

## 2018-11-03 DIAGNOSIS — Z923 Personal history of irradiation: Secondary | ICD-10-CM

## 2018-11-03 DIAGNOSIS — C7802 Secondary malignant neoplasm of left lung: Secondary | ICD-10-CM

## 2018-11-03 NOTE — Assessment & Plan Note (Signed)
Metastatic breast cancer tolungs and bone: Clinically improving onweekly taxol beginning 05-09-16, several delays including for counts, rapid A fib, respiratory infection etc  Treatment summary: 1.Taxol days 1 and 8 every 3 weeks with Xgeva; completed 4cycles from 05/09/2016-08/29/2016 2.LetrozolewithPalbociclib started 10/01/2016 switched to Faslodex with Ibrance 7/16/2019discontinued 07/29/2018  Molecular testing Caris:PDL 1: 0%; AR IHC +90%, ESR 1 mutation ,negative for PI K3 CA mutation, negative for BRCA 1 and 2 mutations ----------------------------------------------------------------------------------------------------------------------------------------------------- Current treatment: Exemestane with everolimusstarted 07/29/2018   Skin rash: Suspicious for shingles.  We gave her a prescription for Valtrex.  CT chest abdomen pelvis 10/30/2018: Worsening hepatic metastatic disease.  Index lesion 7.8 x 5 cm formally 4 x 2.9 cm, right hepatic lobe 9.8 x 8.1 cm formerly 7.9 x 4.5 cm; resolution of right pleural effusion, stable widespread bone metastases in the pelvis and lumbar spine.  Radiology review: Based upon the above CT scan results showing progression of the disease, I recommended systemic chemotherapy.  Options discussed include Xgeva versus intravenous chemo with carboplatin and gemcitabine.  Goal of treatment: Palliation I discussed with her about goals of care and the poor prognosis that patients have when they have extensive liver involvement.  Return to clinic

## 2018-11-03 NOTE — Progress Notes (Signed)
Per Dr. Lindi Adie request, referral sent to [facility] [place].

## 2018-11-04 ENCOUNTER — Telehealth: Payer: Self-pay | Admitting: Hematology and Oncology

## 2018-11-04 NOTE — Telephone Encounter (Signed)
CALLED regarding 4/28

## 2018-11-09 NOTE — Assessment & Plan Note (Signed)
Metastatic breast cancer tolungs and bone: Clinically improving onweekly taxol beginning 05-09-16, several delays including for counts, rapid A fib, respiratory infection etc  Treatment summary: 1.Taxol days 1 and 8 every 3 weeks with Xgeva; completed 4cycles from 05/09/2016-08/29/2016 2.LetrozolewithPalbociclib started 10/01/2016 switched to Faslodex with Ibrance 7/16/2019discontinued 07/29/2018  Molecular testing Caris:PDL 1: 0%; AR IHC +90%, ESR 1 mutation ,negative for PI K3 CA mutation, negative for BRCA 1 and 2 mutations ----------------------------------------------------------------------------------------------------------------------------------------------------- Current treatment: Exemestane with everolimusstarted 07/29/2018   Skin rash: Suspicious for shingles resolved with Valtrex. CT chest abdomen pelvis 10/30/2018: Worsening hepatic metastatic disease.  Index lesion 7.8 x 5 cm formally 4 x 2.9 cm, right hepatic lobe 9.8 x 8.1 cm formerly 7.9 x 4.5 cm; resolution of right pleural effusion, stable widespread bone metastases in the pelvis and lumbar spine.  Severe lower extremity edema: We increase Lasix to 40 twice a day. Patient is contemplating hospice care and did not want to go through more systemic chemotherapy treatment options.  She had a chance to meet with hospice and discuss the benefits of hospice for end-of-life.

## 2018-11-11 MED FILL — DOK 100 MG SOFTGEL: 100 | 30 days supply | Qty: 90 | Fill #0

## 2018-11-12 ENCOUNTER — Telehealth: Payer: Self-pay | Admitting: *Deleted

## 2018-11-12 NOTE — Telephone Encounter (Signed)
error 

## 2018-11-12 NOTE — Telephone Encounter (Signed)
Received call from [place], Adel with [facility] hosp[facility] reporting that the pt had a fall at home this week and did substrain a skin tear.  Leandro Reasoner, RN is wanting to know if Dr. Lindi Adie would be okay with stopping Elaquis at this time.  Per Dr. Lindi Adie okay to discontinue Elaquis for pt.  Also RN stated that the MD with hospice is suggesting to stop pravastatin for the pt, stating that it is hard on her liver and with pt having know liver mets, it may make her more comfortable.  Per Dr. Lindi Adie okay to discontinue pravastatin as well.  Also per Dr. Lindi Adie okay to discontinue pt taking vitamin C.  Leandro Reasoner, RN verbalized understanding.

## 2018-11-16 NOTE — Progress Notes (Signed)
HEMATOLOGY-ONCOLOGY TELEPHONE VISIT PROGRESS NOTE  I connected with Lisa [name] on 11/17/2018 at  9:30 AM EDT by telephone and verified that I am speaking with the correct person using two identifiers.  I discussed the limitations, risks, security and privacy concerns of performing an evaluation and management service by telephone and the availability of in person appointments.  I also discussed with the patient that there may be a patient responsible charge related to this service. The patient expressed understanding and agreed to proceed.   History of Present Illness: Lisa [name] is a 78 y.o. female with above-mentioned history of metastatic breast cancer who was on treatment with everolimus with exemestane but stopped due to severe lower extremity edema and fatigue. She presents today over the phone to discuss whether she will undergo further treatment with systemic chemotherapy or have hospice care.  Patient is enrolled in hospice care.  She is getting weaker progressively.  Her uncle is helping her with food.  Hospice is currently unable to give her a lot of services because of coronavirus but will help her if things get much worse.  She tells me that she has been calling different nursing homes and has not been hearing back from them.    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 Findings concerning for liver metastases, numerous pulmonary nodules bilaterally    05/09/2016 - 09/18/2016 Chemotherapy    Taxol weekly initially and later changed her day 1 day 8 every 3 weeks (complicated by A. fib with RVR and prolonged respiratory infection) along with Xgeva    09/19/2016 Procedure    Biopsy of lung mass: Metastatic carcinoma consistent with breast origin, HER-2 negative ratio 1.37    10/01/2016 -  Anti-estrogen oral therapy    LetrozolewithPalbociclib (Ibrance)started 10/01/2016 switched to Faslodex with Ibrance 02/03/2018    06/23/2017 Imaging    CT chest abdomen pelvis: No evidence of progression of metastatic disease, bone metastases stable right-sided pleural metastases and pleural effusions are stable    06/29/2018 Imaging    Progression of disease in the liver, lung and bones    07/09/2018 Miscellaneous    PDL 1: 0%; AR IHC +90%, ESR 1 mutation ,negative for PI K3 CA mutation, negative for BRCA 1 and 2 mutations    07/2018 Treatment Plan Change    Exemestane/Everolimus    10/30/2018 Imaging    Stable widespread osseous metastates and significant progression of hepatic metastases.      Observations/Objective:  Continued leg swelling, progressive fatigue moderate right upper quadrant discomfort   Assessment Plan:  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 [facility]) Metastatic breast  cancer tolungs and bone: Clinically improving onweekly taxol beginning 05-09-16, several delays including for counts, rapid A fib, respiratory infection etc  Treatment summary: 1.Taxol days 1 and 8 every 3 weeks with Xgeva; completed 4cycles from 05/09/2016-08/29/2016 2.LetrozolewithPalbociclib started 10/01/2016 switched to  Faslodex with Ibrance 7/16/2019discontinued 07/29/2018  Molecular testing Caris:PDL 1: 0%; AR IHC +90%, ESR 1 mutation ,negative for PI K3 CA mutation, negative for BRCA 1 and 2 mutations ----------------------------------------------------------------------------------------------------------------------------------------------------- Current treatment: Exemestane with everolimusstarted 07/29/2018 stopped 10/30/18   Skin rash: Suspicious for shingles resolved with Valtrex. CT chest abdomen pelvis 10/30/2018: Worsening hepatic metastatic disease.  Index lesion 7.8 x 5 cm formally 4 x 2.9 cm, right hepatic lobe 9.8 x 8.1 cm formerly 7.9 x 4.5 cm; resolution of right pleural effusion, stable widespread bone metastases in the pelvis and lumbar spine.  Severe lower extremity edema: On Lasix to 40 twice a day. Patient is enrolled in hospice care. I offered her any help that she needs towards her end-of-life care.  I anticipate a life expectancy of less than 3 months.  I discussed the assessment and treatment plan with the patient. The patient was provided an opportunity to ask questions and all were answered. The patient agreed with the plan and demonstrated an understanding of the instructions. The patient was advised to call back or seek an in-person evaluation if the symptoms worsen or if the condition fails to improve as anticipated.   I provided 15 minutes of non-face-to-face time during this encounter.   Rulon Eisenmenger, MD 11/17/2018    I, Molly Dorshimer, am acting as scribe for Nicholas Lose, MD.  I have reviewed the above documentation for accuracy and completeness, and I agree with the above.

## 2018-11-17 ENCOUNTER — Inpatient Hospital Stay (HOSPITAL_BASED_OUTPATIENT_CLINIC_OR_DEPARTMENT_OTHER): Payer: Medicare Other | Admitting: Hematology and Oncology

## 2018-11-17 DIAGNOSIS — C7802 Secondary malignant neoplasm of left lung: Secondary | ICD-10-CM | POA: Diagnosis not present

## 2018-11-17 DIAGNOSIS — C7801 Secondary malignant neoplasm of right lung: Secondary | ICD-10-CM

## 2018-11-17 DIAGNOSIS — C782 Secondary malignant neoplasm of pleura: Secondary | ICD-10-CM

## 2018-11-17 DIAGNOSIS — C787 Secondary malignant neoplasm of liver and intrahepatic bile duct: Secondary | ICD-10-CM

## 2018-11-17 DIAGNOSIS — Z79811 Long term (current) use of aromatase inhibitors: Secondary | ICD-10-CM

## 2018-11-17 DIAGNOSIS — C7951 Secondary malignant neoplasm of bone: Secondary | ICD-10-CM

## 2018-11-17 DIAGNOSIS — Z923 Personal history of irradiation: Secondary | ICD-10-CM

## 2018-11-17 DIAGNOSIS — C50911 Malignant neoplasm of unspecified site of right female breast: Secondary | ICD-10-CM

## 2018-11-17 DIAGNOSIS — R21 Rash and other nonspecific skin eruption: Secondary | ICD-10-CM

## 2018-11-17 DIAGNOSIS — R600 Localized edema: Secondary | ICD-10-CM

## 2018-11-17 MED FILL — AMIODARONE HCL 200 MG TABS: 200 | 30 days supply | Qty: 30 | Fill #1

## 2018-11-17 MED FILL — DILTIAZEM 24HR CD 120 MG CA: 120 | 30 days supply | Qty: 30 | Fill #5

## 2018-11-17 MED FILL — LOSARTAN POTASSIUM 50 MG TA: 50 | 30 days supply | Qty: 60 | Fill #4

## 2018-11-23 MED FILL — traMADol HCL 50 MG TABS: 50 | 3 days supply | Qty: 15 | Fill #0

## 2018-11-25 MED FILL — BISACODYL EC 5 MG TBEC: 5 | 30 days supply | Qty: 25 | Fill #0

## 2018-12-02 ENCOUNTER — Encounter: Payer: Self-pay | Admitting: *Deleted

## 2018-12-02 NOTE — Progress Notes (Signed)
Received call from [place] with [facility] stating that pt has become weaker and unable to stand without assistance and is not eating as well.  [facility] has transitioned pt to [facility].  Dr. Lindi Adie notified.

## 2018-12-21 ENCOUNTER — Telehealth: Payer: Self-pay | Admitting: Cardiovascular Disease

## 2018-12-21 NOTE — Telephone Encounter (Signed)
New Message    Pt passed away 12/24/18

## 2018-12-21 NOTE — Telephone Encounter (Signed)
I am very sorry to hear that.  She was a Psychologist, educational.

## 2018-12-21 DEATH — deceased

## 2018-12-28 ENCOUNTER — Encounter: Payer: Self-pay | Admitting: Hematology and Oncology

## 2019-01-28 ENCOUNTER — Ambulatory Visit: Payer: Medicare Other

## 2019-02-26 ENCOUNTER — Ambulatory Visit: Payer: Medicare Other | Admitting: Cardiovascular Disease

## 2019-04-30 ENCOUNTER — Ambulatory Visit: Payer: Medicare Other
